# Patient Record
Sex: Female | Born: 1945
Health system: Southern US, Community
[De-identification: ages and names within clinical notes are randomized; demographics above are authoritative.]

## PROBLEM LIST (undated history)

## (undated) DIAGNOSIS — J449 Chronic obstructive pulmonary disease, unspecified: Secondary | ICD-10-CM

## (undated) DIAGNOSIS — I38 Endocarditis, valve unspecified: Secondary | ICD-10-CM

## (undated) DIAGNOSIS — R7303 Prediabetes: Secondary | ICD-10-CM

## (undated) DIAGNOSIS — M1990 Unspecified osteoarthritis, unspecified site: Secondary | ICD-10-CM

## (undated) DIAGNOSIS — Z86010 Personal history of colonic polyps: Secondary | ICD-10-CM

## (undated) DIAGNOSIS — R351 Nocturia: Secondary | ICD-10-CM

## (undated) DIAGNOSIS — Z8719 Personal history of other diseases of the digestive system: Secondary | ICD-10-CM

## (undated) DIAGNOSIS — R011 Cardiac murmur, unspecified: Secondary | ICD-10-CM

## (undated) DIAGNOSIS — C801 Malignant (primary) neoplasm, unspecified: Secondary | ICD-10-CM

## (undated) DIAGNOSIS — R030 Elevated blood-pressure reading, without diagnosis of hypertension: Secondary | ICD-10-CM

## (undated) DIAGNOSIS — G8929 Other chronic pain: Secondary | ICD-10-CM

## (undated) DIAGNOSIS — K5900 Constipation, unspecified: Secondary | ICD-10-CM

## (undated) DIAGNOSIS — G4700 Insomnia, unspecified: Secondary | ICD-10-CM

## (undated) DIAGNOSIS — K219 Gastro-esophageal reflux disease without esophagitis: Secondary | ICD-10-CM

## (undated) DIAGNOSIS — M549 Dorsalgia, unspecified: Secondary | ICD-10-CM

## (undated) DIAGNOSIS — J40 Bronchitis, not specified as acute or chronic: Secondary | ICD-10-CM

## (undated) DIAGNOSIS — J45909 Unspecified asthma, uncomplicated: Secondary | ICD-10-CM

## (undated) DIAGNOSIS — M2550 Pain in unspecified joint: Secondary | ICD-10-CM

## (undated) HISTORY — DX: Elevated blood-pressure reading, without diagnosis of hypertension: R03.0

## (undated) HISTORY — DX: Endocarditis, valve unspecified: I38

## (undated) HISTORY — PX: BACK SURGERY: SHX140

## (undated) HISTORY — DX: Constipation, unspecified: K59.00

## (undated) HISTORY — PX: OTHER SURGICAL HISTORY: SHX169

## (undated) HISTORY — PX: COLONOSCOPY: SHX174

## (undated) HISTORY — DX: Personal history of colonic polyps: Z86.010

## (undated) HISTORY — PX: ABDOMINAL HYSTERECTOMY: SHX81

## (undated) HISTORY — PX: WRIST SURGERY: SHX841

## (undated) HISTORY — PX: PARTIAL HYSTERECTOMY: SHX80

## (undated) HISTORY — DX: Pain in unspecified joint: M25.50

## (undated) HISTORY — PX: TUBAL LIGATION: SHX77

## (undated) HISTORY — PX: RECTOCELE REPAIR: SHX761

## (undated) HISTORY — PX: RESECTION TUMOR WRIST RADICAL: SUR1266

## (undated) HISTORY — DX: Prediabetes: R73.03

## (undated) HISTORY — PX: BUNIONECTOMY: SHX129

---

## 1972-07-14 HISTORY — PX: TUBAL LIGATION: SHX77

## 1986-07-14 HISTORY — PX: BACK SURGERY: SHX140

## 1998-01-19 ENCOUNTER — Other Ambulatory Visit: Admission: RE | Admit: 1998-01-19 | Discharge: 1998-01-19 | Payer: Self-pay | Admitting: *Deleted

## 1999-04-25 ENCOUNTER — Other Ambulatory Visit: Admission: RE | Admit: 1999-04-25 | Discharge: 1999-04-25 | Payer: Self-pay | Admitting: *Deleted

## 2000-08-06 ENCOUNTER — Other Ambulatory Visit: Admission: RE | Admit: 2000-08-06 | Discharge: 2000-08-06 | Payer: Self-pay | Admitting: *Deleted

## 2000-08-06 ENCOUNTER — Encounter: Payer: Self-pay | Admitting: Family Medicine

## 2000-08-06 ENCOUNTER — Encounter: Admission: RE | Admit: 2000-08-06 | Discharge: 2000-08-06 | Payer: Self-pay | Admitting: Family Medicine

## 2003-03-13 DIAGNOSIS — Z860100 Personal history of colon polyps, unspecified: Secondary | ICD-10-CM

## 2003-03-13 HISTORY — DX: Personal history of colonic polyps: Z86.010

## 2003-03-13 HISTORY — DX: Personal history of colon polyps, unspecified: Z86.0100

## 2006-09-22 ENCOUNTER — Ambulatory Visit (HOSPITAL_COMMUNITY): Admission: RE | Admit: 2006-09-22 | Discharge: 2006-09-22 | Payer: Self-pay | Admitting: General Surgery

## 2011-08-21 ENCOUNTER — Encounter (HOSPITAL_COMMUNITY): Payer: Self-pay | Admitting: Pharmacy Technician

## 2011-09-01 NOTE — Pre-Procedure Instructions (Signed)
20 Janice Pace  09/01/2011   Your procedure is scheduled on:   Wednesday 09/03/11    Report to [facility] at 1000 AM.  Call this number if you have problems the morning of surgery: [phone]   Remember:   Do not eat food:After Midnight.  May have clear liquids: up to 4 Hours before arrival.  Clear liquids include soda, tea, black coffee, apple or grape juice, broth.  Take these medicines the morning of surgery with A SIP OF WATER:  Prilosec, oxycodone    Do not wear jewelry, make-up or nail polish.  Do not wear lotions, powders, or perfumes. You may wear deodorant.  Do not shave 48 hours prior to surgery.  Do not bring valuables to the hospital.  Contacts, dentures or bridgework may not be worn into surgery.  Leave suitcase in the car. After surgery it may be brought to your room.  For patients admitted to the hospital, checkout time is 11:00 AM the day of discharge.   Patients discharged the day of surgery will not be allowed to drive home.  Name and phone number of your driver:   Special Instructions: CHG Shower Use Special Wash: 1/2 bottle night before surgery and 1/2 bottle morning of surgery.   Please read over the following fact sheets that you were given: Pain Booklet, Total Joint Packet and MRSA Information

## 2011-09-02 ENCOUNTER — Encounter (HOSPITAL_COMMUNITY)
Admission: RE | Admit: 2011-09-02 | Discharge: 2011-09-02 | Disposition: A | Discharge status: Home / Self Care | Payer: Worker's Compensation | Source: Ambulatory Visit | Attending: Neurosurgery | Admitting: Neurosurgery

## 2011-09-02 ENCOUNTER — Encounter (HOSPITAL_COMMUNITY): Payer: Self-pay

## 2011-09-02 HISTORY — DX: Insomnia, unspecified: G47.00

## 2011-09-02 HISTORY — DX: Gastro-esophageal reflux disease without esophagitis: K21.9

## 2011-09-02 HISTORY — DX: Other chronic pain: G89.29

## 2011-09-02 HISTORY — DX: Nocturia: R35.1

## 2011-09-02 HISTORY — DX: Cardiac murmur, unspecified: R01.1

## 2011-09-02 HISTORY — DX: Dorsalgia, unspecified: M54.9

## 2011-09-02 HISTORY — DX: Malignant (primary) neoplasm, unspecified: C80.1

## 2011-09-02 LAB — BASIC METABOLIC PANEL
BUN: 15 mg/dL (ref 6–23)
Calcium: 9.5 mg/dL (ref 8.4–10.5)
GFR calc Af Amer: >90 mL/min (ref >90)
GFR calc non Af Amer: >90 mL/min (ref >90)
Potassium: 3.9 mEq/L (ref 3.5–5.1)
Sodium: 137 mEq/L (ref 135–145)

## 2011-09-02 LAB — SURGICAL PCR SCREEN
MRSA, PCR: NEGATIVE
Staphylococcus aureus: NEGATIVE

## 2011-09-02 LAB — CBC
Hemoglobin: 12.6 g/dL (ref 12.0–15.0)
MCH: 30.2 pg (ref 26.0–34.0)
MCHC: 34.4 g/dL (ref 30.0–36.0)
Platelets: 223 10*3/uL (ref 150–400)
RBC: 4.17 MIL/uL (ref 3.87–5.11)

## 2011-09-02 LAB — TYPE AND SCREEN

## 2011-09-02 MED ORDER — CEFAZOLIN SODIUM 1-5 GM-% IV SOLN
1.0000 g | INTRAVENOUS | Status: AC
  Administered 2011-09-03: 1 g via INTRAVENOUS

## 2011-09-02 NOTE — Progress Notes (Signed)
Pt has never had a stress test/echo/heart cath  Doesn't have a cardiologist 

## 2011-09-03 ENCOUNTER — Encounter (HOSPITAL_COMMUNITY): Payer: Self-pay | Admitting: Certified Registered"

## 2011-09-03 ENCOUNTER — Ambulatory Visit (HOSPITAL_COMMUNITY): Payer: Worker's Compensation | Admitting: Certified Registered"

## 2011-09-03 ENCOUNTER — Ambulatory Visit (HOSPITAL_COMMUNITY): Payer: Worker's Compensation

## 2011-09-03 ENCOUNTER — Inpatient Hospital Stay (HOSPITAL_COMMUNITY)
Admission: RE | Admit: 2011-09-03 | Discharge: 2011-09-05 | DRG: 460 | Disposition: A | Discharge status: Home / Self Care | Payer: Worker's Compensation | Source: Ambulatory Visit | Attending: Neurosurgery | Admitting: Neurosurgery

## 2011-09-03 ENCOUNTER — Encounter (HOSPITAL_COMMUNITY)
Admission: RE | Disposition: A | Discharge status: Home / Self Care | Payer: Self-pay | Source: Ambulatory Visit | Attending: Neurosurgery

## 2011-09-03 ENCOUNTER — Encounter (HOSPITAL_COMMUNITY): Payer: Self-pay | Admitting: *Deleted

## 2011-09-03 DIAGNOSIS — Z01812 Encounter for preprocedural laboratory examination: Secondary | ICD-10-CM

## 2011-09-03 DIAGNOSIS — M5126 Other intervertebral disc displacement, lumbar region: Principal | ICD-10-CM | POA: Diagnosis present

## 2011-09-03 DIAGNOSIS — K219 Gastro-esophageal reflux disease without esophagitis: Secondary | ICD-10-CM | POA: Diagnosis present

## 2011-09-03 DIAGNOSIS — M5137 Other intervertebral disc degeneration, lumbosacral region: Secondary | ICD-10-CM | POA: Diagnosis present

## 2011-09-03 DIAGNOSIS — K5900 Constipation, unspecified: Secondary | ICD-10-CM | POA: Diagnosis present

## 2011-09-03 DIAGNOSIS — M51379 Other intervertebral disc degeneration, lumbosacral region without mention of lumbar back pain or lower extremity pain: Secondary | ICD-10-CM | POA: Diagnosis present

## 2011-09-03 HISTORY — PX: BACK SURGERY: SHX140

## 2011-09-03 SURGERY — POSTERIOR LUMBAR FUSION 2 LEVEL
Anesthesia: General | Site: Back | Wound class: Clean

## 2011-09-03 MED ORDER — HYDROMORPHONE HCL PF 1 MG/ML IJ SOLN
0.2500 mg | INTRAMUSCULAR | Status: DC | PRN
  Administered 2011-09-03 (×4): 0.5 mg via INTRAVENOUS

## 2011-09-03 MED ORDER — OXYCODONE-ACETAMINOPHEN 5-325 MG PO TABS
1.0000 | ORAL_TABLET | ORAL | Status: DC | PRN
  Administered 2011-09-04 – 2011-09-05 (×8): 2 via ORAL
  Filled 2011-09-03 (×8): qty 2

## 2011-09-03 MED ORDER — ACETAMINOPHEN 325 MG PO TABS: 650.0000 mg | ORAL_TABLET | ORAL | Status: DC | PRN

## 2011-09-03 MED ORDER — DROPERIDOL 2.5 MG/ML IJ SOLN
INTRAMUSCULAR | Status: DC | PRN
  Administered 2011-09-03: 0.625 mg via INTRAVENOUS

## 2011-09-03 MED ORDER — PANTOPRAZOLE SODIUM 40 MG PO TBEC
40.0000 mg | DELAYED_RELEASE_TABLET | Freq: Every day | ORAL | Status: DC
  Administered 2011-09-04: 40 mg via ORAL
  Filled 2011-09-03: qty 1

## 2011-09-03 MED ORDER — CYCLOBENZAPRINE HCL 10 MG PO TABS: 10.0000 mg | ORAL_TABLET | Freq: Three times a day (TID) | ORAL | Status: DC | PRN

## 2011-09-03 MED ORDER — NEOSTIGMINE METHYLSULFATE 1 MG/ML IJ SOLN
INTRAMUSCULAR | Status: DC | PRN
  Administered 2011-09-03: 3 mg via INTRAVENOUS

## 2011-09-03 MED ORDER — THROMBIN 20000 UNITS EX KIT
PACK | CUTANEOUS | Status: DC | PRN
  Administered 2011-09-03 (×2): via TOPICAL

## 2011-09-03 MED ORDER — BACITRACIN 50000 UNITS IM SOLR
INTRAMUSCULAR | Status: AC
  Filled 2011-09-03: qty 1

## 2011-09-03 MED ORDER — BUPIVACAINE HCL (PF) 0.25 % IJ SOLN
INTRAMUSCULAR | Status: DC | PRN
  Administered 2011-09-03: 30 mL

## 2011-09-03 MED ORDER — HYDROMORPHONE HCL PF 1 MG/ML IJ SOLN
0.5000 mg | INTRAMUSCULAR | Status: DC | PRN
  Administered 2011-09-03: 0.5 mg via INTRAVENOUS
  Administered 2011-09-03 – 2011-09-04 (×5): 1 mg via INTRAVENOUS
  Filled 2011-09-03 (×6): qty 1

## 2011-09-03 MED ORDER — HYDROMORPHONE HCL PF 1 MG/ML IJ SOLN
INTRAMUSCULAR | Status: AC
  Administered 2011-09-03: 0.5 mg via INTRAVENOUS
  Filled 2011-09-03: qty 1

## 2011-09-03 MED ORDER — ONDANSETRON HCL 4 MG/2ML IJ SOLN: 4.0000 mg | Freq: Once | INTRAMUSCULAR | Status: DC | PRN

## 2011-09-03 MED ORDER — VARENICLINE TARTRATE 1 MG PO TABS
1.0000 mg | ORAL_TABLET | Freq: Two times a day (BID) | ORAL | Status: DC
  Administered 2011-09-04 – 2011-09-05 (×3): 1 mg via ORAL
  Filled 2011-09-03 (×5): qty 1

## 2011-09-03 MED ORDER — ONDANSETRON HCL 4 MG/2ML IJ SOLN
INTRAMUSCULAR | Status: DC | PRN
  Administered 2011-09-03: 4 mg via INTRAVENOUS

## 2011-09-03 MED ORDER — SODIUM CHLORIDE 0.9 % IV SOLN
INTRAVENOUS | Status: AC
  Filled 2011-09-03: qty 500

## 2011-09-03 MED ORDER — ACETAMINOPHEN 650 MG RE SUPP: 650.0000 mg | RECTAL | Status: DC | PRN

## 2011-09-03 MED ORDER — CEFAZOLIN SODIUM 1-5 GM-% IV SOLN
1.0000 g | Freq: Three times a day (TID) | INTRAVENOUS | Status: AC
  Administered 2011-09-03 – 2011-09-04 (×2): 1 g via INTRAVENOUS
  Filled 2011-09-03 (×2): qty 50

## 2011-09-03 MED ORDER — DEXAMETHASONE SODIUM PHOSPHATE 4 MG/ML IJ SOLN
INTRAMUSCULAR | Status: DC | PRN
  Administered 2011-09-03: 4 mg via INTRAVENOUS

## 2011-09-03 MED ORDER — LIDOCAINE-EPINEPHRINE 1 %-1:100000 IJ SOLN
INTRAMUSCULAR | Status: DC | PRN
  Administered 2011-09-03: 20 mL

## 2011-09-03 MED ORDER — MIDAZOLAM HCL 5 MG/5ML IJ SOLN
INTRAMUSCULAR | Status: DC | PRN
  Administered 2011-09-03: 2 mg via INTRAVENOUS

## 2011-09-03 MED ORDER — SODIUM CHLORIDE 0.9 % IJ SOLN
3.0000 mL | Freq: Two times a day (BID) | INTRAMUSCULAR | Status: DC
  Administered 2011-09-03 – 2011-09-04 (×3): 3 mL via INTRAVENOUS

## 2011-09-03 MED ORDER — LACTATED RINGERS IV SOLN
INTRAVENOUS | Status: DC | PRN
  Administered 2011-09-03 (×3): via INTRAVENOUS

## 2011-09-03 MED ORDER — PHENOL 1.4 % MT LIQD: 1.0000 | OROMUCOSAL | Status: DC | PRN

## 2011-09-03 MED ORDER — SODIUM CHLORIDE 0.9 % IR SOLN
Status: DC | PRN
  Administered 2011-09-03: 14:00:00

## 2011-09-03 MED ORDER — POTASSIUM GLUCONATE 595 MG PO CAPS: 1.0000 | ORAL_CAPSULE | Freq: Every day | ORAL | Status: DC

## 2011-09-03 MED ORDER — 0.9 % SODIUM CHLORIDE (POUR BTL) OPTIME
TOPICAL | Status: DC | PRN
  Administered 2011-09-03: 1000 mL

## 2011-09-03 MED ORDER — ROCURONIUM BROMIDE 100 MG/10ML IV SOLN
INTRAVENOUS | Status: DC | PRN
  Administered 2011-09-03: 10 mg via INTRAVENOUS
  Administered 2011-09-03: 50 mg via INTRAVENOUS
  Administered 2011-09-03: 10 mg via INTRAVENOUS
  Administered 2011-09-03: 30 mg via INTRAVENOUS
  Administered 2011-09-03: 10 mg via INTRAVENOUS

## 2011-09-03 MED ORDER — ONDANSETRON HCL 4 MG/2ML IJ SOLN: 4.0000 mg | INTRAMUSCULAR | Status: DC | PRN

## 2011-09-03 MED ORDER — LIDOCAINE HCL 4 % MT SOLN
OROMUCOSAL | Status: DC | PRN
  Administered 2011-09-03: 4 mL via TOPICAL

## 2011-09-03 MED ORDER — VARENICLINE TARTRATE 0.5 MG X 11 & 1 MG X 42 PO MISC: 1.0000 | Freq: Two times a day (BID) | ORAL | Status: DC

## 2011-09-03 MED ORDER — PROPOFOL 10 MG/ML IV EMUL
INTRAVENOUS | Status: DC | PRN
  Administered 2011-09-03: 170 mg via INTRAVENOUS

## 2011-09-03 MED ORDER — SUFENTANIL CITRATE 50 MCG/ML IV SOLN
INTRAVENOUS | Status: DC | PRN
  Administered 2011-09-03 (×5): 10 ug via INTRAVENOUS

## 2011-09-03 MED ORDER — OXYCODONE-ACETAMINOPHEN 7.5-325 MG PO TABS: 1.0000 | ORAL_TABLET | ORAL | Status: DC | PRN

## 2011-09-03 MED ORDER — PANTOPRAZOLE SODIUM 40 MG IV SOLR
40.0000 mg | Freq: Every day | INTRAVENOUS | Status: DC
  Administered 2011-09-03: 40 mg via INTRAVENOUS
  Filled 2011-09-03 (×2): qty 40

## 2011-09-03 MED ORDER — LIDOCAINE HCL (CARDIAC) 20 MG/ML IV SOLN
INTRAVENOUS | Status: DC | PRN
  Administered 2011-09-03: 100 mg via INTRAVENOUS

## 2011-09-03 MED ORDER — CYCLOBENZAPRINE HCL 10 MG PO TABS
10.0000 mg | ORAL_TABLET | Freq: Three times a day (TID) | ORAL | Status: DC | PRN
  Administered 2011-09-04 (×3): 10 mg via ORAL
  Filled 2011-09-03 (×3): qty 1

## 2011-09-03 MED ORDER — GLYCOPYRROLATE 0.2 MG/ML IJ SOLN
INTRAMUSCULAR | Status: DC | PRN
  Administered 2011-09-03: .6 mg via INTRAVENOUS

## 2011-09-03 MED ORDER — MENTHOL 3 MG MT LOZG: 1.0000 | LOZENGE | OROMUCOSAL | Status: DC | PRN

## 2011-09-03 SURGICAL SUPPLY — 76 items
ADH SKN CLS APL DERMABOND .7 (GAUZE/BANDAGES/DRESSINGS) ×1
APL SKNCLS STERI-STRIP NONHPOA (GAUZE/BANDAGES/DRESSINGS) ×2
BAG DECANTER FOR FLEXI CONT (MISCELLANEOUS) ×2 IMPLANT
BENZOIN TINCTURE PRP APPL 2/3 (GAUZE/BANDAGES/DRESSINGS) ×3 IMPLANT
BLADE SURG 11 STRL SS (BLADE) ×2 IMPLANT
BLADE SURG ROTATE 9660 (MISCELLANEOUS) IMPLANT
BRUSH SCRUB EZ PLAIN DRY (MISCELLANEOUS) ×2 IMPLANT
BUR MATCHSTICK NEURO 3.0 LAGG (BURR) ×2 IMPLANT
BUR PRECISION FLUTE 6.0 (BURR) ×2 IMPLANT
CANISTER SUCTION 2500CC (MISCELLANEOUS) ×2 IMPLANT
CAP REVERE LOCKING (Cap) ×6 IMPLANT
CLOSURE STERI STRIP 1/2 X4 (GAUZE/BANDAGES/DRESSINGS) ×1 IMPLANT
CLOTH BEACON ORANGE TIMEOUT ST (SAFETY) ×2 IMPLANT
CONN CROSSLINK REV 6.35 48-60 (Connector) ×2 IMPLANT
CONNECTOR CRSLNK REV6.35 48-60 (Connector) IMPLANT
CONT SPEC 4OZ CLIKSEAL STRL BL (MISCELLANEOUS) ×4 IMPLANT
COVER BACK TABLE 24X17X13 BIG (DRAPES) IMPLANT
COVER TABLE BACK 60X90 (DRAPES) ×2 IMPLANT
DECANTER SPIKE VIAL GLASS SM (MISCELLANEOUS) ×2 IMPLANT
DERMABOND ADVANCED (GAUZE/BANDAGES/DRESSINGS) ×1
DERMABOND ADVANCED .7 DNX12 (GAUZE/BANDAGES/DRESSINGS) ×1 IMPLANT
DRAPE C-ARM 42X72 X-RAY (DRAPES) ×4 IMPLANT
DRAPE LAPAROTOMY 100X72X124 (DRAPES) ×2 IMPLANT
DRAPE POUCH INSTRU U-SHP 10X18 (DRAPES) ×2 IMPLANT
DRAPE PROXIMA HALF (DRAPES) IMPLANT
DRAPE SURG 17X23 STRL (DRAPES) ×2 IMPLANT
DRSG OPSITE 4X5.5 SM (GAUZE/BANDAGES/DRESSINGS) ×4 IMPLANT
ELECT REM PT RETURN 9FT ADLT (ELECTROSURGICAL) ×2
ELECTRODE REM PT RTRN 9FT ADLT (ELECTROSURGICAL) ×1 IMPLANT
EVACUATOR 3/16  PVC DRAIN (DRAIN) ×1
EVACUATOR 3/16 PVC DRAIN (DRAIN) ×1 IMPLANT
GAUZE SPONGE 4X4 12PLY STRL LF (GAUZE/BANDAGES/DRESSINGS) ×1 IMPLANT
GAUZE SPONGE 4X4 16PLY XRAY LF (GAUZE/BANDAGES/DRESSINGS) ×1 IMPLANT
GLOVE BIO SURGEON STRL SZ8 (GLOVE) ×5 IMPLANT
GLOVE BIOGEL PI IND STRL 7.0 (GLOVE) IMPLANT
GLOVE BIOGEL PI IND STRL 7.5 (GLOVE) IMPLANT
GLOVE BIOGEL PI INDICATOR 7.0 (GLOVE) ×3
GLOVE BIOGEL PI INDICATOR 7.5 (GLOVE) ×1
GLOVE ECLIPSE 7.5 STRL STRAW (GLOVE) ×2 IMPLANT
GLOVE ECLIPSE 8.5 STRL (GLOVE) ×1 IMPLANT
GLOVE EXAM NITRILE LRG STRL (GLOVE) IMPLANT
GLOVE EXAM NITRILE MD LF STRL (GLOVE) ×1 IMPLANT
GLOVE EXAM NITRILE XL STR (GLOVE) IMPLANT
GLOVE EXAM NITRILE XS STR PU (GLOVE) IMPLANT
GLOVE INDICATOR 8.5 STRL (GLOVE) ×4 IMPLANT
GLOVE SURG SS PI 6.5 STRL IVOR (GLOVE) ×3 IMPLANT
GOWN BRE IMP SLV AUR LG STRL (GOWN DISPOSABLE) ×2 IMPLANT
GOWN BRE IMP SLV AUR XL STRL (GOWN DISPOSABLE) ×7 IMPLANT
GOWN STRL REIN 2XL LVL4 (GOWN DISPOSABLE) IMPLANT
KIT BASIN OR (CUSTOM PROCEDURE TRAY) ×2 IMPLANT
KIT ROOM TURNOVER OR (KITS) ×2 IMPLANT
MILL MEDIUM DISP (BLADE) ×1 IMPLANT
NDL HYPO 25X1 1.5 SAFETY (NEEDLE) ×1 IMPLANT
NEEDLE HYPO 25X1 1.5 SAFETY (NEEDLE) ×2 IMPLANT
NS IRRIG 1000ML POUR BTL (IV SOLUTION) ×2 IMPLANT
PACK LAMINECTOMY NEURO (CUSTOM PROCEDURE TRAY) ×2 IMPLANT
PAD ARMBOARD 7.5X6 YLW CONV (MISCELLANEOUS) ×6 IMPLANT
PATTIES SURGICAL 1X1 (DISPOSABLE) ×1 IMPLANT
PUTTY BONE DBX 5CC MIX (Putty) ×1 IMPLANT
ROD REVERE CURVED 65MM (Rod) ×2 IMPLANT
SCREW REVERE 6.35 6.5X35MM (Screw) ×2 IMPLANT
SCREW REVERE 6.35 6.5X40MM (Screw) ×4 IMPLANT
SPACER CALIBER 10X22MM 8-12MM (Spacer) ×4 IMPLANT
SPONGE GAUZE 4X4 12PLY (GAUZE/BANDAGES/DRESSINGS) ×2 IMPLANT
SPONGE LAP 4X18 X RAY DECT (DISPOSABLE) IMPLANT
SPONGE SURGIFOAM ABS GEL 100 (HEMOSTASIS) ×3 IMPLANT
STRIP CLOSURE SKIN 1/2X4 (GAUZE/BANDAGES/DRESSINGS) ×4 IMPLANT
SUT VIC AB 0 CT1 18XCR BRD8 (SUTURE) ×1 IMPLANT
SUT VIC AB 0 CT1 8-18 (SUTURE) ×4
SUT VIC AB 2-0 CT1 18 (SUTURE) ×3 IMPLANT
SUT VICRYL 4-0 PS2 18IN ABS (SUTURE) ×3 IMPLANT
SYR 20ML ECCENTRIC (SYRINGE) ×2 IMPLANT
TOWEL OR 17X24 6PK STRL BLUE (TOWEL DISPOSABLE) ×2 IMPLANT
TOWEL OR 17X26 10 PK STRL BLUE (TOWEL DISPOSABLE) ×2 IMPLANT
TRAY FOLEY CATH 14FRSI W/METER (CATHETERS) ×2 IMPLANT
WATER STERILE IRR 1000ML POUR (IV SOLUTION) ×2 IMPLANT

## 2011-09-03 NOTE — Transfer of Care (Signed)
Immediate Anesthesia Transfer of Care Note  Patient: Janice Pace  Procedure(s) Performed: Procedure(s) (LRB): POSTERIOR LUMBAR FUSION 2 LEVEL (N/A)  Patient Location: PACU  Anesthesia Type: General  Level of Consciousness: oriented and sedated  Airway & Oxygen Therapy: Patient Spontanous Breathing and Patient connected to nasal cannula oxygen  Post-op Assessment: Report given to PACU RN and Post -op Vital signs reviewed and stable  Post vital signs: stable  Complications: No apparent anesthesia complications

## 2011-09-03 NOTE — H&P (Signed)
Janice Pace is an 66 y.o. female.   Chief Complaint: Back and right leg pain HPI: This is 65 years and is a long-standing back and from a right buttock and leg pain rate in the back of her leg across her her foot and big toe she's had numbness tingling in the same distribution as well as inclusion on the bottom of her foot. She denies any L. bladder difficulty she does have some pain it is in her left leg but not as severe as her right. This is been refractory to all forms of conservative 2 with anti-inflammatories physical therapy exercises epidural steroid injections. Imaging findings showed still large ruptured disc at L4-5 severe degenerative disease marked facet arthropathy diastased so the facets as well as at L5 S1-1 laminotomy defect in the right. Due to his failure conservative treatment progression clunk syndrome she was recommended posterior decompression fusion others minutes of the operation with her as well as perioperative course and expectations of outcome and alternatives of surgery she understands and agrees to proceed forward.  Past Medical History  Diagnosis Date  . Cancer     dermatosivros arcoma and protuverans  . Heart murmur   . Chronic back pain     degenerative disc disease  . GERD (gastroesophageal reflux disease)     takes Prilosec daily  . Constipation     stool softener daily  . Hemorrhoids   . Nocturia   . Insomnia     d/t chantix    Past Surgical History  Procedure Date  . Tubal ligation 1974  . Abdominal hysterectomy   . Bunionectomy     bilateral  . Back surgery 1988  . Rectocele repair   . Colonoscopy   . Wrist surgery     left    Family History  Problem Relation Age of Onset  . Anesthesia problems Neg Hx   . Hypotension Neg Hx   . Malignant hyperthermia Neg Hx   . Pseudochol deficiency Neg Hx    Social History:  reports that she has quit smoking. She does not have any smokeless tobacco history on file. She reports that she does not drink  alcohol or use illicit drugs.  Allergies: No Known Allergies  Medications Prior to Admission  Medication Dose Route Frequency Provider Last Rate Last Dose  . bacitracin 11914 UNITS injection           . ceFAZolin (ANCEF) IVPB 1 g/50 mL premix  1 g Intravenous 30 min Pre-Op Mariam Dollar, MD      . HYDROmorphone (DILAUDID) injection 0.25-0.5 mg  0.25-0.5 mg Intravenous Q5 min PRN Melonie Florida, MD      . ondansetron [facility]) injection 4 mg  4 mg Intravenous Once PRN Melonie Florida, MD      . sodium chloride 0.9 % infusion            No current outpatient prescriptions on file as of 09/03/2011.    Results for orders placed during the hospital encounter of 09/02/11 (from the past 48 hour(s))  SURGICAL PCR SCREEN     Status: Normal   Collection Time   09/02/11  9:27 AM      Component Value Range Comment   MRSA, PCR NEGATIVE  NEGATIVE     Staphylococcus aureus NEGATIVE  NEGATIVE    BASIC METABOLIC PANEL     Status: Abnormal   Collection Time   09/02/11  9:28 AM      Component Value Range  Comment   Sodium 137  135 - 145 (mEq/L)    Potassium 3.9  3.5 - 5.1 (mEq/L)    Chloride 106  96 - 112 (mEq/L)    CO2 22  19 - 32 (mEq/L)    Glucose, Bld 102 (*) 70 - 99 (mg/dL)    BUN 15  6 - 23 (mg/dL)    Creatinine, Ser 7.82  0.50 - 1.10 (mg/dL)    Calcium 9.5  8.4 - 10.5 (mg/dL)    GFR calc non Af Amer >90  >90 (mL/min)    GFR calc Af Amer >90  >90 (mL/min)   CBC     Status: Normal   Collection Time   09/02/11  9:28 AM      Component Value Range Comment   WBC 5.7  4.0 - 10.5 (K/uL)    RBC 4.17  3.87 - 5.11 (MIL/uL)    Hemoglobin 12.6  12.0 - 15.0 (g/dL)    HCT 95.6  21.3 - 08.6 (%)    MCV 87.8  78.0 - 100.0 (fL)    MCH 30.2  26.0 - 34.0 (pg)    MCHC 34.4  30.0 - 36.0 (g/dL)    RDW 57.8  46.9 - 62.9 (%)    Platelets 223  150 - 400 (K/uL)   TYPE AND SCREEN     Status: Normal   Collection Time   09/02/11  9:35 AM      Component Value Range Comment   ABO/RH(D) B POS      Antibody  Screen NEG      Sample Expiration 09/05/2011     ABO/RH     Status: Normal   Collection Time   09/02/11  9:35 AM      Component Value Range Comment   ABO/RH(D) B POS      No results found.  Review of Systems  Constitutional: Negative.   HENT: Negative.   Respiratory: Negative.   Cardiovascular: Negative.   Gastrointestinal: Negative.   Genitourinary: Negative.   Musculoskeletal: Positive for myalgias and back pain.  Skin: Negative.   Neurological: Positive for tingling.    Blood pressure 119/78, pulse 99, temperature 98.2 F (36.8 C), temperature source Oral, resp. rate 20, SpO2 96.00%. Physical Exam  Constitutional: She is oriented to person, place, and time. She appears well-developed.  HENT:  Head: Normocephalic.  Eyes: Pupils are equal, round, and reactive to light.  Neck: Neck supple.  Cardiovascular: Normal rate.   Respiratory: Breath sounds normal.  GI: Bowel sounds are normal.  Neurological: She is alert and oriented to person, place, and time.  Reflex Scores:      Patellar reflexes are 0 on the right side and 0 on the left side.      Achilles reflexes are 0 on the right side and 0 on the left side.      Patient is 5 out of 5 strength in her left lower extremities however the right looks to me has 4 minus out of 5 weakness in dorsiflexion and EHL proximally is 5 out of 5 in the right looks to me as well.     Assessment/Plan 66 year old presents for L4-5 L5-S1 posterior lumbar interbody fusion risks and benefits of the operation were explained the patient as well as the perioperative course alternatives of surgery expectations of outcome. He understands only and agrees to proceed forward.  [name] P 09/03/2011, 12:33 PM

## 2011-09-03 NOTE — Op Note (Signed)
Preoperative diagnosis: Degenerative disc disease L4-5 L5-S1 herniated nucleus pulposus L4-5  Postoperative diagnosis: Same  Procedure: Decompressive lumbar laminectomy L4-5 in excess will be new with a standard interbody fusion redo decompressive laminectomy L5-S1 posterior lumbar interbody fusion L4-5 L5-S1 using the caliper peek expandable cages packed with local autograft mixed with DBX and pedicle screw fixation L4-S1 using the globus Revere pedicle screw system. Posterior lateral arthrodesis L4-S1 using locally harvested autograft mixed with DBX  Surgeon: Jillyn Hidden Timouthy Gilardi  Assistants: Marikay Alar  Anesthesia: Gen.  EBL: Less than 200  History of present illness: Patient reports 66 year old female who has had long-standing back pain and probably right leg pain she undergone previous right-sided L5-S1 next me for a disc. Over the patient has had rest worsening back and right leg pain. Workup revealed a large ruptured disc at L4-5 migrating caudally behind the L5 vertebral body displacing the L5 nerve root. Additional workup showed severe degenerative disc disease L4-5 L5-S1 with modic changes and severe degenerative collapse causing foraminal stenosis and disc space collapse as well. To the patient's combination of severe back and leg pain and failure conservative treatment imaging findings and progression of clinical syndrome I recommended decompression and stabilization procedure at L4-5 and L5-S1. One of the risk benefits of the operation, perioperative course, alternatives of surgery, expectations of outcome she understands and agrees to proceed forward.  Operative procedure: This is brought in the or was induced under general anesthesia and positioned prone on the Milne frame her back was prepped and draped in routine sterile fashion. Her old incision was verified and extended cephalad caudally subperiosteal dissection was carried out the lamina of L4-5 and S1 bilaterally exposing the TPS at L4-5  and S1 bilaterally. Interoperative x-ray confirmed an identify the appropriate level. The spinous processes at L4 and L5 removing such decompression was begun complete medial facetectomies were performed bilaterally at L4-5 on the left at L5-S1 and the scar tissues dissected off of the right side and complete medial facetectomy was continued and progressed on the right as well. All foramina were widely decompressed distally at the foramen there is a large herniated disc immediately identified at L4-5 on the right underneath the L5 nerve root and between the L5 nerve root the pedicle. Is all removed after extension of his annulotomy and nerve hook to fish out all fragments. After this was accomplished and the L5 nerve of the right was decompressed attention second pedicle screw placement the using a high-speed drill pilot holes were drilled at L4 with the awl probed after 55 tap probed again and a 6 x 40 screw inserted L4 and the right similar fashion the L5 and S1 pedicles were also identified cannulated probed tapped probed and 12/[birth year]/1938 screw inserted L5 6 x 35 at S1. 3 screws were placed on the left side similar fashion fluoroscopy used the step along the way to confirm depth and trajectory. After all screws in place attention was taken to the interbody work and 8 distractor was inserted on the right L4-5 disc space is then cleaned out from the left with a size 8 rotating cutter and a size 89 paddle cutters and curettes were also used to scrape the endplate after adequate endplate preparation been achieved a by 10 mm x 22 mm expandable 12 mm caliper peek cage was packed with local graft mixed with DBX then inserted on the patient's left side L4-5 is expanded almost his full extension approximately 12 mm opening of the disc space present for the  fibers that side. The right and fluoroscopy confirmed good position of the implant. In a similar fashion all the endplates were prepped on the right side L4-5 after  adequate preparation both centrally and laterally was achieved local autograft mixed DBX was packed centrally right-sided cage was inserted and it is well to the same height and graft was packed laterally to cages bilaterally. Fluoroscopy be confirmed the position the cages at L4-5 to. In a similar fashion L5-S1 was prepped out however this disc spaces markedly more collapsed size 7 distractor was inserted to open up the disc space is then cleaned out and 8 expandable cage was inserted a similar fashion to the disc space up to about 10 or 11 mm local autograft that he was again packed centrally after completion endplate preparation and another cages inserted left side. After all the implants in place the wound was copiously irrigated meticulous incision was maintained in a foraminal reexplored to confirm patency. Then the aggressive decortication was carried out on the TPS and lateral gutters. The remainder of the posterior lateral bone was then packed an L4 down to S1. The rods were then contoured in lordosis top tightening nuts were tightened down to S1 he L5 screw was compressed against S1 and the L4 compressed against L5. Then a foraminal again reinspected Gelfoam was overlaid top of the dura across it was applied and the wounds closed in layers after placement of a Hemovac drain with interrupted Vicryl and a running 4 subcuticular in the skin. Benzoin and Steri-Strips were applied. At the end of case all needle counts and sponge counts were correct per the nursing.

## 2011-09-03 NOTE — Progress Notes (Signed)
PHARMACIST - PHYSICIAN ORDER COMMUNICATION  CONCERNING: P&T Medication Policy on Herbal Medications  DESCRIPTION:  This patient's order for: Potassium gluconate 595mg  has been noted.  This product(s) is classified as an "herbal" or natural product. Due to a lack of definitive safety studies or FDA approval, nonstandard manufacturing practices, plus the potential risk of unknown drug-drug interactions while on inpatient medications, the Pharmacy and Therapeutics Committee does not permit the use of "herbal" or natural products of this type within [facility].   ACTION TAKEN: The pharmacy department is unable to verify this order at this time and your patient has been informed of this safety policy. Please reevaluate patient's clinical condition at discharge and address if the herbal or natural product(s) should be resumed at that time.  Arman Filter 09/03/2011, 19:58 PM

## 2011-09-03 NOTE — Anesthesia Preprocedure Evaluation (Signed)
Anesthesia Evaluation  Patient identified by MRN, date of birth, ID band Patient awake    Reviewed: Allergy & Precautions, H&P , NPO status   Airway       Dental  (+) Teeth Intact   Pulmonary  clear to auscultation        Cardiovascular Regular Normal    Neuro/Psych    GI/Hepatic   Endo/Other    Renal/GU      Musculoskeletal   Abdominal   Peds  Hematology   Anesthesia Other Findings   Reproductive/Obstetrics                           Anesthesia Physical Anesthesia Plan  ASA: II  Anesthesia Plan: General   Post-op Pain Management:    Induction: Intravenous  Airway Management Planned: Oral ETT  Additional Equipment:   Intra-op Plan:   Post-operative Plan: Extubation in OR  Informed Consent: I have reviewed the patients History and Physical, chart, labs and discussed the procedure including the risks, benefits and alternatives for the proposed anesthesia with the patient or authorized representative who has indicated his/her understanding and acceptance.   Dental advisory given  Plan Discussed with:   Anesthesia Plan Comments: (Smoker GERD Lumbar spondylosis  Plan GA  Kipp Brood, MD)        Anesthesia Quick Evaluation

## 2011-09-03 NOTE — Anesthesia Postprocedure Evaluation (Signed)
  Anesthesia Post-op Note  Patient: Janice Pace  Procedure(s) Performed: Procedure(s) (LRB): POSTERIOR LUMBAR FUSION 2 LEVEL (N/A)  Patient Location: PACU  Anesthesia Type: General  Level of Consciousness: awake, alert  and oriented  Airway and Oxygen Therapy: Patient Spontanous Breathing  Post-op Pain: mild  Post-op Assessment: Post-op Vital signs reviewed and Patient's Cardiovascular Status Stable  Post-op Vital Signs: stable  Complications: No apparent anesthesia complications

## 2011-09-04 MED ORDER — MANAGING BACK PAIN BOOK
Freq: Once | Status: AC
  Administered 2011-09-04: 08:00:00
  Filled 2011-09-04: qty 1

## 2011-09-04 NOTE — Progress Notes (Signed)
OT Discharge Note  Patient is being discharged from OT services secondary to:    Goals met and no further therapy needs identified.  Please see latest Therapy Progress Note for current level of functioning and progress toward goals.  Progress and discharge plan and discussed with patient/caregiver and they    Agree    Johnston, Brandon Wiechman Brynn   OTR/L Pager: [phone] Office: [phone] .     

## 2011-09-04 NOTE — Progress Notes (Signed)
Physical Therapy Evaluation Patient Details Name: BREDA BOND MRN: [id number] DOB: 10-06-1945 Today's Date: 09/04/2011  Problem List: There is no problem list on file for this patient.   Past Medical History:  Past Medical History  Diagnosis Date  . Cancer     dermatosivros arcoma and protuverans  . Heart murmur   . Chronic back pain     degenerative disc disease  . GERD (gastroesophageal reflux disease)     takes Prilosec daily  . Constipation     stool softener daily  . Hemorrhoids   . Nocturia   . Insomnia     d/t chantix   Past Surgical History:  Past Surgical History  Procedure Date  . Tubal ligation 1974  . Abdominal hysterectomy   . Bunionectomy     bilateral  . Back surgery 1988  . Rectocele repair   . Colonoscopy   . Wrist surgery     left    PT Assessment/Plan/Recommendation PT Assessment Clinical Impression Statement: pt presents s/p PLIF.  pt moving well and very motivated to improve mobility.  pt attempts to follow all precautions and education.  Should make great progress.   PT Recommendation/Assessment: Patient will need skilled PT in the acute care venue PT Problem List: Decreased activity tolerance;Decreased balance;Decreased mobility;Decreased knowledge of use of DME;Decreased knowledge of precautions;Pain Barriers to Discharge: None PT Therapy Diagnosis : Difficulty walking;Acute pain PT Plan PT Frequency: Min 5X/week PT Treatment/Interventions: DME instruction;Gait training;Stair training;Functional mobility training;Therapeutic activities;Therapeutic exercise;Balance training;Neuromuscular re-education;Patient/family education PT Recommendation Follow Up Recommendations: Home health PT;Supervision - Intermittent Equipment Recommended: Rolling walker with 5" wheels PT Goals  Acute Rehab PT Goals PT Goal Formulation: With patient Time For Goal Achievement: 2 weeks Pt will Roll Supine to Right Side: Independently PT Goal: Rolling Supine to  Right Side - Progress: Goal set today Pt will Roll Supine to Left Side: Independently PT Goal: Rolling Supine to Left Side - Progress: Goal set today Pt will go Supine/Side to Sit: Independently PT Goal: Supine/Side to Sit - Progress: Goal set today Pt will go Sit to Supine/Side: Independently PT Goal: Sit to Supine/Side - Progress: Goal set today Pt will go Sit to Stand: with modified independence;with upper extremity assist PT Goal: Sit to Stand - Progress: Goal set today Pt will Ambulate: >150 feet;with modified independence;with rolling walker PT Goal: Ambulate - Progress: Goal set today Pt will Go Up / Down Stairs: 3-5 stairs;with min assist;with least restrictive assistive device PT Goal: Up/Down Stairs - Progress: Goal set today Additional Goals Additional Goal #1: pt will verbalize and follow 3/3 back precautions.   PT Goal: Additional Goal #1 - Progress: Goal set today  PT Evaluation Precautions/Restrictions  Precautions Precautions: Back Precaution Booklet Issued: Yes (comment) Required Braces or Orthoses: Yes Spinal Brace: Lumbar corset;Applied in sitting position Restrictions Weight Bearing Restrictions: No Prior Functioning  Home Living Lives With: Spouse Receives Help From: Family Type of Home: House Home Layout: One level Home Access: Stairs to enter Entrance Stairs-Rails: Left Entrance Stairs-Number of Steps: 2 Bathroom Shower/Tub: Health visitor: Standard Bathroom Accessibility: Yes How Accessible: Accessible via walker Home Adaptive Equipment: Shower chair without back;Straight cane Prior Function Level of Independence: Needs assistance with ADLs;Needs assistance with homemaking;Independent with gait;Independent with transfers Able to Take Stairs?: Yes Driving: No Vocation: On disability Vocation Requirements: Psychologist, educational Orientation Level: Oriented X4 Sensation/Coordination   Extremity Assessment RLE  Assessment RLE Assessment: Within Functional Limits LLE Assessment LLE Assessment: Within Functional Limits Mobility (  including Balance) Bed Mobility Bed Mobility: Yes Rolling Right: 5: Supervision Rolling Right Details (indicate cue type and reason): cues for log roll Right Sidelying to Sit: 5: Supervision Right Sidelying to Sit Details (indicate cue type and reason): cues for sequencing and technique Sitting - Scoot to Edge of Bed: 7: Independent Transfers Transfers: Yes Sit to Stand: 4: Min assist;With upper extremity assist;From bed Sit to Stand Details (indicate cue type and reason): cues for UE use only Stand to Sit: 4: Min assist;With upper extremity assist;To chair/3-in-1 Stand to Sit Details: cues to get closer to chair prior to sitting and control descent.   Ambulation/Gait Ambulation/Gait: Yes Ambulation/Gait Assistance: 4: Min assist Ambulation/Gait Assistance Details (indicate cue type and reason): cues for positioning in RW, upright posture, sequencing.   Ambulation Distance (Feet): 140 Feet Assistive device: Rolling walker Gait Pattern: Step-through pattern;Decreased stride length;Trunk flexed Stairs: No Wheelchair Mobility Wheelchair Mobility: No  Posture/Postural Control Posture/Postural Control: No significant limitations Balance Balance Assessed: No Exercise    End of Session PT - End of Session Equipment Utilized During Treatment: Gait belt;Back brace Activity Tolerance: Patient tolerated treatment well Patient left: in chair;with call bell in reach;with family/visitor present Nurse Communication: Mobility status for transfers;Mobility status for ambulation General Behavior During Session: [facility] for tasks performed Cognition: [facility] for tasks performed  Sunny Schlein, [place] [phone] 09/04/2011, 11:08 AM

## 2011-09-04 NOTE — Evaluation (Signed)
Occupational Therapy Evaluation Patient Details Name: Janice Pace MRN: [id number] DOB: April 12, 1946 Today's Date: 09/04/2011  Problem List: There is no problem list on file for this patient.   Past Medical History:  Past Medical History  Diagnosis Date  . Cancer     dermatosivros arcoma and protuverans  . Heart murmur   . Chronic back pain     degenerative disc disease  . GERD (gastroesophageal reflux disease)     takes Prilosec daily  . Constipation     stool softener daily  . Hemorrhoids   . Nocturia   . Insomnia     d/t chantix   Past Surgical History:  Past Surgical History  Procedure Date  . Tubal ligation 1974  . Abdominal hysterectomy   . Bunionectomy     bilateral  . Back surgery 1988  . Rectocele repair   . Colonoscopy   . Wrist surgery     left    OT Assessment/Plan/Recommendation OT Recommendation Follow Up Recommendations: No OT follow up Equipment Recommended: Rolling walker with 5" wheels;3 in 1 bedside comode  OT Evaluation Precautions/Restrictions  Precautions Precautions: Back Precaution Booklet Issued: Yes (comment) Precaution Comments: handout present in room Required Braces or Orthoses: Yes Spinal Brace: Lumbar corset;Applied in sitting position Restrictions Weight Bearing Restrictions: No Prior Functioning Home Living Lives With: Spouse Receives Help From: Family Type of Home: House Home Layout: One level Home Access: Stairs to enter Entrance Stairs-Rails: Left Entrance Stairs-Number of Steps: 2 Bathroom Shower/Tub: Health visitor: Standard Bathroom Accessibility: Yes How Accessible: Accessible via walker Home Adaptive Equipment: Shower chair without back;Straight cane Prior Function Level of Independence: Needs assistance with ADLs;Needs assistance with homemaking;Independent with gait;Independent with transfers Able to Take Stairs?: Yes Driving: No Vocation: On disability Vocation Requirements:  Accounting ADL ADL Eating/Feeding: Performed;Modified independent Where Assessed - Eating/Feeding: Chair Grooming: Performed;Teeth care;Modified independent Where Assessed - Grooming: Standing at sink Lower Body Dressing: Simulated;Modified independent Where Assessed - Lower Body Dressing: Sitting, chair (crossing BIL LE) Toilet Transfer: Simulated;Modified independent Toilet Transfer Method: Proofreader: Raised toilet seat with arms (or 3-in-1 over toilet) Toileting - Clothing Manipulation: Simulated;Supervision/safety Where Assessed - Toileting Clothing Manipulation: Sit to stand from 3-in-1 or toilet Toileting - Hygiene: Not assessed Tub/Shower Transfer: Performed;Supervision/safety Tub/Shower Transfer Method: Ambulating Equipment Used: Rolling walker Ambulation Related to ADLs: Pt ambulated to restroom Mod I level ADL Comments: Pt is able to cross bil LE and touch toes. Pt and daughter educated on bed mobility. Daughter return demonstrating on OT. Pt is a adequate level for d/c home Vision/Perception  Vision - History Baseline Vision: No visual deficits Patient Visual Report: No change from baseline Cognition Cognition Orientation Level: Oriented X4 Sensation/Coordination Coordination Gross Motor Movements are Fluid and Coordinated: Yes Fine Motor Movements are Fluid and Coordinated: Yes Extremity Assessment RUE Assessment RUE Assessment: Within Functional Limits (grossly) LUE Assessment LUE Assessment: Within Functional Limits (grossly) Mobility  Bed Mobility Bed Mobility: No Transfers Transfers: Yes Sit to Stand: 6: Modified independent (Device/Increase time);With upper extremity assist;From chair/3-in-1 Sit to Stand Details (indicate cue type and reason): no cues needed correct hand positioning, excellent recall of PT session Stand to Sit: 6: Modified independent (Device/Increase time);With upper extremity assist;To chair/3-in-1    End of  Session OT - End of Session Equipment Utilized During Treatment: Gait belt;Back brace Activity Tolerance: Patient tolerated treatment well Patient left: in chair;with call bell in reach;with family/visitor present (daughter) Nurse Communication: Mobility status for transfers;Mobility status for ambulation  General Behavior During Session: [facility] for tasks performed Cognition: [facility] for tasks performed   Lucile Shutters 09/04/2011, 2:21 PM  Pager: [phone]

## 2011-09-04 NOTE — Progress Notes (Signed)
Subjective: Patient reports Patient doing very well sitting up eating breakfast no leg pain back is also her but well-managed on the oral analgesics.  Objective: Vital signs in last 24 hours: Temp:  [97.9 F (36.6 C)-98.8 F (37.1 C)] 98.3 F (36.8 C) (02/21 1838) Pulse Rate:  [89-128] 108  (02/21 1838) Resp:  [17-20] 18  (02/21 1838) BP: (101-124)/(63-76) 110/63 mmHg (02/21 1838) SpO2:  [94 %-99 %] 99 % (02/21 1838) Weight:  [63.957 kg (141 lb)] 63.957 kg (141 lb) (02/20 2149)  Intake/Output from previous day: 02/20 0701 - 02/21 0700 In: 2375 [I.V.:2300] Out: 2895 [Urine:2350; Drains:150; Blood:395] Intake/Output this shift: Total I/O In: -  Out: 2020 [Urine:1800; Drains:220]  Strength is 5 out of 5 wound is clean and dry the  Lab Results:  [facility] 09/02/11 0928  WBC 5.7  HGB 12.6  HCT 36.6  PLT 223   BMET  Basename 09/02/11 0928  NA 137  K 3.9  CL 106  CO2 22  GLUCOSE 102*  BUN 15  CREATININE 0.57  CALCIUM 9.5    Studies/Results: Dg Lumbar Spine 2-3 Views  09/03/2011  *RADIOLOGY REPORT*  Clinical Data: Lumbar fusion.  L4-L5 and L5-S1.  LUMBAR SPINE - 2-3 VIEW  Comparison: None.  Findings: AP and lateral intraoperative views.  These demonstrate L4-L5 and L5-S1 posterior trans pedicle screw fixation.  Interbody fusion material is centrally positioned.  No acute hardware complication identified.  IMPRESSION: Intraoperative imaging of L4-S1 posterior fixation.  Original Report Authenticated By: Consuello Bossier, M.D.    Assessment/Plan: Progressive mobilization posterior day 1 from a PLIF discontinue her Foley, PT  LOS: 1 day     Aliannah Holstrom P 09/04/2011, 6:50 PM

## 2011-09-05 MED ORDER — OXYCODONE-ACETAMINOPHEN 5-325 MG PO TABS: 1.0000 | ORAL_TABLET | ORAL | Status: AC | PRN

## 2011-09-05 MED ORDER — CYCLOBENZAPRINE HCL 10 MG PO TABS: 10.0000 mg | ORAL_TABLET | Freq: Three times a day (TID) | ORAL | Status: AC | PRN

## 2011-09-05 NOTE — Discharge Summary (Signed)
  Physician Discharge Summary  Patient ID: Janice Pace MRN: [id number] DOB/AGE: [birth year] 65 y.o.  Admit date: 09/03/2011 Discharge date: 09/05/2011  Admission Diagnoses: Grade 1 spondylolisthesis and lumbar spinal stenosis L4-5 L5-S1  Discharge Diagnoses: Same Active Problems:  * No active hospital problems. *    Discharged Condition: good  Hospital Course: Patient was admitted to the hospital on Wednesday underwent a decompressive lumbar laminectomy and fusion at L4-5 and L5-S1 postoperatively patient did very well went to recovery in the floor on the floor patient convalesced well pain was well-controlled she was angling and voiding spontaneously the time of discharge patient be discharged upon the proximal one to 2 weeks on oral analgesics. Her last crit on discharge is full strength in her lower extremities her wound is clean and dry.  Consults: Significant Diagnostic Studies: Treatments: Decompressive lumbar laminectomy and posterior lumbar interbody fusion L4-5 L5-S1 the Discharge Exam: Blood pressure 110/71, pulse 113, temperature 98.2 F (36.8 C), temperature source Oral, resp. rate 18, height 5\' 3"  (1.6 m), weight 63.957 kg (141 lb), SpO2 98.00%. Strength is 5 out of 5 wound is clean and dry the  Disposition: Home   Medication List  As of 09/05/2011  1:42 PM   TAKE these medications         ascorbic acid 1000 MG tablet   Commonly known as: VITAMIN C   Take 1,000 mg by mouth daily.      Calcium-Vitamin D 600-200 MG-UNIT Caps   Take 1 capsule by mouth daily.      cyclobenzaprine 10 MG tablet   Commonly known as: FLEXERIL   Take 10 mg by mouth 3 (three) times daily as needed. For muscle spasms      cyclobenzaprine 10 MG tablet   Commonly known as: FLEXERIL   Take 1 tablet (10 mg total) by mouth 3 (three) times daily as needed for muscle spasms.      Garlic 1000 MG Caps   Take 1 capsule by mouth daily.      HAIR/SKIN/NAILS PO   Take 1 capsule by mouth daily.        omeprazole 20 MG capsule   Commonly known as: PRILOSEC   Take 20 mg by mouth daily.      oxyCODONE-acetaminophen 7.5-325 MG per tablet   Commonly known as: PERCOCET   Take 1 tablet by mouth every 4 (four) hours as needed. For pain      oxyCODONE-acetaminophen 5-325 MG per tablet   Commonly known as: PERCOCET   Take 1-2 tablets by mouth every 4 (four) hours as needed.      Potassium Gluconate 595 MG Caps   Take 1 capsule by mouth daily.      varenicline 0.5 MG X 11 & 1 MG X 42 tablet   Commonly known as: CHANTIX PAK   Take by mouth 2 (two) times daily. Take one 0.5 mg tablet by mouth once daily for 3 days, then increase to one 0.5 mg tablet twice daily for 4 days, then increase to one 1 mg tablet twice daily.      Vitamin D 2000 UNITS Caps   Take 2 capsules by mouth daily.           Follow-up Information    Follow up in 1 week.         Signed: Lateia Fraser P 09/05/2011, 1:42 PM

## 2011-09-05 NOTE — Progress Notes (Signed)
Subjective: Patient reports She's doing very well no leg pain back pain is well controlled she's angling and voiding spontaneously  Objective: Vital signs in last 24 hours: Temp:  [98.2 F (36.8 C)-99.6 F (37.6 C)] 98.2 F (36.8 C) (02/22 1025) Pulse Rate:  [108-119] 113  (02/22 1025) Resp:  [18-20] 18  (02/22 1025) BP: (95-114)/(62-72) 110/71 mmHg (02/22 1025) SpO2:  [94 %-99 %] 98 % (02/22 1025)  Intake/Output from previous day: 02/21 0701 - 02/22 0700 In: 415 [P.O.:360] Out: 2130 [Urine:1800; Drains:330] Intake/Output this shift: Total I/O In: 340 [P.O.:340] Out: -   Strength is 5 out of 5 wound is clean and dry the  Lab Results: No results found for this basename: WBC:2,HGB:2,HCT:2,PLT:2 in the last 72 hours BMET No results found for this basename: NA:2,K:2,CL:2,CO2:2,GLUCOSE:2,BUN:2,CREATININE:2,CALCIUM:2 in the last 72 hours  Studies/Results: Dg Lumbar Spine 2-3 Views  09/03/2011  *RADIOLOGY REPORT*  Clinical Data: Lumbar fusion.  L4-L5 and L5-S1.  LUMBAR SPINE - 2-3 VIEW  Comparison: None.  Findings: AP and lateral intraoperative views.  These demonstrate L4-L5 and L5-S1 posterior trans pedicle screw fixation.  Interbody fusion material is centrally positioned.  No acute hardware complication identified.  IMPRESSION: Intraoperative imaging of L4-S1 posterior fixation.  Original Report Authenticated By: Consuello Bossier, M.D.    Assessment/Plan: Posterior day 2 from a pleasant doing very well discharged home scheduled followup in one to 2 weeks.  LOS: 2 days     Rynlee Lisbon P 09/05/2011, 1:39 PM

## 2011-09-05 NOTE — Progress Notes (Signed)
Physical Therapy Treatment Patient Details Name: Janice Pace MRN: [id number] DOB: 1945-09-21 Today's Date: 09/05/2011  PT Assessment/Plan  PT - Assessment/Plan Comments on Treatment Session: pt presents s/p PLIF and making great progress. PT Plan: Discharge plan remains appropriate;Frequency remains appropriate PT Frequency: Min 5X/week Follow Up Recommendations: Home health PT;Supervision - Intermittent Equipment Recommended: Rolling walker with 5" wheels;3 in 1 bedside comode PT Goals  Acute Rehab PT Goals PT Goal: Sit to Stand - Progress: Met PT Goal: Ambulate - Progress: Progressing toward goal PT Goal: Up/Down Stairs - Progress: Progressing toward goal Additional Goals PT Goal: Additional Goal #1 - Progress: Progressing toward goal  PT Treatment Precautions/Restrictions  Precautions Precautions: Back Precaution Booklet Issued: Yes (comment) Precaution Comments: handout present in room Required Braces or Orthoses: Yes Spinal Brace: Lumbar corset;Applied in sitting position Restrictions Weight Bearing Restrictions: No Mobility (including Balance) Bed Mobility Bed Mobility: No Transfers Transfers: Yes Sit to Stand: 6: Modified independent (Device/Increase time);With upper extremity assist;From chair/3-in-1 Stand to Sit: 6: Modified independent (Device/Increase time);With upper extremity assist;To chair/3-in-1 Ambulation/Gait Ambulation/Gait: Yes Ambulation/Gait Assistance: 5: Supervision Ambulation/Gait Assistance Details (indicate cue type and reason): good use of RW Ambulation Distance (Feet): 180 Feet Assistive device: Rolling walker Gait Pattern: Step-through pattern;Decreased stride length;Trunk flexed Stairs: Yes Stairs Assistance: 4: Min assist Stairs Assistance Details (indicate cue type and reason): cue sfo rsafe technique Stair Management Technique: One rail Left;Forwards;Step to pattern Number of Stairs: 2  Wheelchair Mobility Wheelchair Mobility: No    Posture/Postural Control Posture/Postural Control: No significant limitations Balance Balance Assessed: No Exercise    End of Session PT - End of Session Equipment Utilized During Treatment: Gait belt;Back brace Activity Tolerance: Patient tolerated treatment well Patient left: in chair;with call bell in reach;with family/visitor present Nurse Communication: Mobility status for transfers;Mobility status for ambulation General Behavior During Session: [facility] for tasks performed Cognition: [facility] for tasks performed  Sunny Schlein, [place] [phone] 09/05/2011, 1:01 PM

## 2011-09-05 NOTE — Progress Notes (Signed)
Pt. Was discharged home in stable condition with walker and 3 in 1 bedside commode. All discharge instructions, prescriptions and medications were reviewed with pt. Pt left with all belongings.

## 2011-09-05 NOTE — Progress Notes (Signed)
CARE MANAGEMENT NOTE 09/05/2011      Action/Plan:   Discharge planning. Spoke with patient and husband. Contacted worker's comp case manager - Lafonda Mosses [phone]. Instructed to call [facility] to order DME.   Anticipated DC Date:  09/05/2011   Anticipated DC Plan:  HOME/SELF CARE      DC Planning Services  CM consult      PAC Choice  DURABLE MEDICAL EQUIPMENT   Choice offered to / List presented to:     DME arranged  3-N-1  WALKER - ROLLING      DME agency  OTHER - SEE NOTE     HH arranged  NA      HH agency  NA   Status of service:  In process, will continue to follow  Discharge Disposition:  HOME W HOME HEALTH SERVICES   Comments:  09/05/11 1415 Vance Peper, RN BSN Case Manager Patient's adjuster is Jola Baptist [phone], fax [phone].  faxed orders for Rolling walker and 3in1 to [phone].Waiting for authorization to get DME from [facility].

## 2011-09-05 NOTE — Progress Notes (Signed)
Utilization review completed. Janith Nielson, RN, BSN. 09/05/11 

## 2011-09-05 NOTE — Discharge Instructions (Signed)
No lifting no bending no twisting no driving a riding a car and she's come back for the see me wound clean and dry cover with Saran wrap for showers

## 2011-09-08 MED FILL — Sodium Chloride IV Soln 0.9%: INTRAVENOUS | Qty: 1000 | Status: AC

## 2011-09-08 MED FILL — Heparin Sodium (Porcine) Inj 1000 Unit/ML: INTRAMUSCULAR | Qty: 30 | Status: AC

## 2011-10-14 DIAGNOSIS — H40059 Ocular hypertension, unspecified eye: Secondary | ICD-10-CM | POA: Diagnosis not present

## 2011-12-18 DIAGNOSIS — M549 Dorsalgia, unspecified: Secondary | ICD-10-CM | POA: Diagnosis not present

## 2011-12-18 DIAGNOSIS — K219 Gastro-esophageal reflux disease without esophagitis: Secondary | ICD-10-CM | POA: Diagnosis not present

## 2011-12-18 DIAGNOSIS — K5900 Constipation, unspecified: Secondary | ICD-10-CM | POA: Diagnosis not present

## 2012-01-27 DIAGNOSIS — Z780 Asymptomatic menopausal state: Secondary | ICD-10-CM | POA: Diagnosis not present

## 2012-01-27 DIAGNOSIS — Z1211 Encounter for screening for malignant neoplasm of colon: Secondary | ICD-10-CM | POA: Diagnosis not present

## 2012-01-27 DIAGNOSIS — Z23 Encounter for immunization: Secondary | ICD-10-CM | POA: Diagnosis not present

## 2012-01-27 DIAGNOSIS — N898 Other specified noninflammatory disorders of vagina: Secondary | ICD-10-CM | POA: Diagnosis not present

## 2012-01-27 DIAGNOSIS — Z0000 Encounter for general adult medical examination without abnormal findings: Secondary | ICD-10-CM | POA: Diagnosis not present

## 2012-02-16 DIAGNOSIS — Z136 Encounter for screening for cardiovascular disorders: Secondary | ICD-10-CM | POA: Diagnosis not present

## 2012-02-16 DIAGNOSIS — Z780 Asymptomatic menopausal state: Secondary | ICD-10-CM | POA: Diagnosis not present

## 2012-02-16 DIAGNOSIS — Z0000 Encounter for general adult medical examination without abnormal findings: Secondary | ICD-10-CM | POA: Diagnosis not present

## 2012-02-21 DIAGNOSIS — Z1231 Encounter for screening mammogram for malignant neoplasm of breast: Secondary | ICD-10-CM | POA: Diagnosis not present

## 2012-06-17 DIAGNOSIS — Z23 Encounter for immunization: Secondary | ICD-10-CM | POA: Diagnosis not present

## 2012-07-28 ENCOUNTER — Encounter: Payer: Self-pay | Admitting: Internal Medicine

## 2012-08-09 DIAGNOSIS — B351 Tinea unguium: Secondary | ICD-10-CM | POA: Diagnosis not present

## 2012-08-09 DIAGNOSIS — IMO0002 Reserved for concepts with insufficient information to code with codable children: Secondary | ICD-10-CM | POA: Diagnosis not present

## 2012-08-12 DIAGNOSIS — B351 Tinea unguium: Secondary | ICD-10-CM | POA: Diagnosis not present

## 2012-08-12 DIAGNOSIS — L600 Ingrowing nail: Secondary | ICD-10-CM | POA: Diagnosis not present

## 2012-08-12 DIAGNOSIS — L03039 Cellulitis of unspecified toe: Secondary | ICD-10-CM | POA: Diagnosis not present

## 2012-08-23 ENCOUNTER — Ambulatory Visit (AMBULATORY_SURGERY_CENTER): Payer: Medicare Other | Admitting: *Deleted

## 2012-08-23 VITALS — Ht 63.0 in | Wt 177.4 lb

## 2012-08-23 DIAGNOSIS — Z1211 Encounter for screening for malignant neoplasm of colon: Secondary | ICD-10-CM

## 2012-08-23 DIAGNOSIS — Z86010 Personal history of colonic polyps: Secondary | ICD-10-CM

## 2012-08-23 MED ORDER — NA SULFATE-K SULFATE-MG SULF 17.5-3.13-1.6 GM/177ML PO SOLN: ORAL | Status: DC

## 2012-09-02 DIAGNOSIS — L600 Ingrowing nail: Secondary | ICD-10-CM | POA: Diagnosis not present

## 2012-09-06 ENCOUNTER — Encounter: Payer: Self-pay | Admitting: Internal Medicine

## 2012-09-06 ENCOUNTER — Ambulatory Visit (AMBULATORY_SURGERY_CENTER): Payer: Medicare Other | Admitting: Internal Medicine

## 2012-09-06 VITALS — BP 123/74 | HR 74 | Temp 98.1°F | Resp 16 | Ht 63.0 in | Wt 177.0 lb

## 2012-09-06 DIAGNOSIS — K648 Other hemorrhoids: Secondary | ICD-10-CM

## 2012-09-06 DIAGNOSIS — Z86010 Personal history of colonic polyps: Secondary | ICD-10-CM | POA: Diagnosis not present

## 2012-09-06 DIAGNOSIS — K644 Residual hemorrhoidal skin tags: Secondary | ICD-10-CM

## 2012-09-06 DIAGNOSIS — K219 Gastro-esophageal reflux disease without esophagitis: Secondary | ICD-10-CM | POA: Diagnosis not present

## 2012-09-06 DIAGNOSIS — Z1211 Encounter for screening for malignant neoplasm of colon: Secondary | ICD-10-CM

## 2012-09-06 MED ORDER — SODIUM CHLORIDE 0.9 % IV SOLN: 500.0000 mL | INTRAVENOUS | Status: DC

## 2012-09-06 NOTE — Progress Notes (Signed)
YOU HAD AN ENDOSCOPIC PROCEDURE TODAY AT THE [facility]: Refer to the procedure report that was given to you for any specific questions about what was found during the examination.  If the procedure report does not answer your questions, please call your gastroenterologist to clarify.  If you requested that your care partner not be given the details of your procedure findings, then the procedure report has been included in a sealed envelope for you to review at your convenience later.  YOU SHOULD EXPECT: Some feelings of bloating in the abdomen. Passage of more gas than usual.  Walking can help get rid of the air that was put into your GI tract during the procedure and reduce the bloating. If you had a lower endoscopy (such as a colonoscopy or flexible sigmoidoscopy) you may notice spotting of blood in your stool or on the toilet paper. If you underwent a bowel prep for your procedure, then you may not have a normal bowel movement for a few days.  DIET: Your first meal following the procedure should be a light meal and then it is ok to progress to your normal diet.  A half-sandwich or bowl of soup is an example of a good first meal.  Heavy or fried foods are harder to digest and may make you feel nauseous or bloated.  Likewise meals heavy in dairy and vegetables can cause extra gas to form and this can also increase the bloating.  Drink plenty of fluids but you should avoid alcoholic beverages for 24 hours.  ACTIVITY: Your care partner should take you home directly after the procedure.  You should plan to take it easy, moving slowly for the rest of the day.  You can resume normal activity the day after the procedure however you should NOT DRIVE or use heavy machinery for 24 hours (because of the sedation medicines used during the test).    SYMPTOMS TO REPORT IMMEDIATELY: A gastroenterologist can be reached at any hour.  During normal business hours, 8:30 AM to 5:00 PM Monday through Friday,  call [phone].  After hours and on weekends, please call the GI answering service at [phone] who will take a message and have the physician on call contact you.   Following lower endoscopy (colonoscopy or flexible sigmoidoscopy):  Excessive amounts of blood in the stool  Significant tenderness or worsening of abdominal pains  Swelling of the abdomen that is new, acute  Fever of 100F or higher    FOLLOW UP: If any biopsies were taken you will be contacted by phone or by letter within the next 1-3 weeks.  Call your gastroenterologist if you have not heard about the biopsies in 3 weeks.  Our staff will call the home number listed on your records the next business day following your procedure to check on you and address any questions or concerns that you may have at that time regarding the information given to you following your procedure. This is a courtesy call and so if there is no answer at the home number and we have not heard from you through the emergency physician on call, we will assume that you have returned to your regular daily activities without incident.  SIGNATURES/CONFIDENTIALITY: You and/or your care partner have signed paperwork which will be entered into your electronic medical record.  These signatures attest to the fact that that the information above on your After Visit Summary has been reviewed and is understood.  Full responsibility of the confidentiality   of this discharge information lies with you and/or your care-partner.     

## 2012-09-06 NOTE — Progress Notes (Signed)
Patient did not experience any of the following events: a burn prior to discharge; a fall within the facility; wrong site/side/patient/procedure/implant event; or a hospital transfer or hospital admission upon discharge from the facility. (G8907) Patient did not have preoperative order for IV antibiotic SSI prophylaxis. (G8918)  

## 2012-09-06 NOTE — Patient Instructions (Addendum)
No polyps today and you had a great prep!  You do have some hemorrhoids - not usually a significant medical problem.  I recommend that you have another routine preventive colonoscopy in about 10 years - 2024.  Thank you for choosing me and [facility].  Iva Boop, MD, [facility]  Hemorrhoid handout given today. Resume current medications! Call us with any questions or concerns. Thank you!!   YOU HAD AN ENDOSCOPIC PROCEDURE TODAY AT THE [facility]: Refer to the procedure report that was given to you for any specific questions about what was found during the examination.  If the procedure report does not answer your questions, please call your gastroenterologist to clarify.  If you requested that your care partner not be given the details of your procedure findings, then the procedure report has been included in a sealed envelope for you to review at your convenience later.  YOU SHOULD EXPECT: Some feelings of bloating in the abdomen. Passage of more gas than usual.  Walking can help get rid of the air that was put into your GI tract during the procedure and reduce the bloating. If you had a lower endoscopy (such as a colonoscopy or flexible sigmoidoscopy) you may notice spotting of blood in your stool or on the toilet paper. If you underwent a bowel prep for your procedure, then you may not have a normal bowel movement for a few days.  DIET: Your first meal following the procedure should be a light meal and then it is ok to progress to your normal diet.  A half-sandwich or bowl of soup is an example of a good first meal.  Heavy or fried foods are harder to digest and may make you feel nauseous or bloated.  Likewise meals heavy in dairy and vegetables can cause extra gas to form and this can also increase the bloating.  Drink plenty of fluids but you should avoid alcoholic beverages for 24 hours.  ACTIVITY: Your care partner should take you home directly after the  procedure.  You should plan to take it easy, moving slowly for the rest of the day.  You can resume normal activity the day after the procedure however you should NOT DRIVE or use heavy machinery for 24 hours (because of the sedation medicines used during the test).    SYMPTOMS TO REPORT IMMEDIATELY: A gastroenterologist can be reached at any hour.  During normal business hours, 8:30 AM to 5:00 PM Monday through Friday, call [phone].  After hours and on weekends, please call the GI answering service at [phone] who will take a message and have the physician on call contact you.   Following lower endoscopy (colonoscopy or flexible sigmoidoscopy):  Excessive amounts of blood in the stool  Significant tenderness or worsening of abdominal pains  Swelling of the abdomen that is new, acute  Fever of 100F or higher  Following upper endoscopy (EGD)  Vomiting of blood or coffee ground material  New chest pain or pain under the shoulder blades  Painful or persistently difficult swallowing  New shortness of breath  Fever of 100F or higher  Black, tarry-looking stools  FOLLOW UP: If any biopsies were taken you will be contacted by phone or by letter within the next 1-3 weeks.  Call your gastroenterologist if you have not heard about the biopsies in 3 weeks.  Our staff will call the home number listed on your records the next business day following your procedure to check on  you and address any questions or concerns that you may have at that time regarding the information given to you following your procedure. This is a courtesy call and so if there is no answer at the home number and we have not heard from you through the emergency physician on call, we will assume that you have returned to your regular daily activities without incident.  SIGNATURES/CONFIDENTIALITY: You and/or your care partner have signed paperwork which will be entered into your electronic medical record.  These  signatures attest to the fact that that the information above on your After Visit Summary has been reviewed and is understood.  Full responsibility of the confidentiality of this discharge information lies with you and/or your care-partner.

## 2012-09-06 NOTE — Progress Notes (Signed)
Patient did not have preoperative order for IV antibiotic SSI prophylaxis. (G8918)  Patient did not experience any of the following events: a burn prior to discharge; a fall within the facility; wrong site/side/patient/procedure/implant event; or a hospital transfer or hospital admission upon discharge from the facility. (G8907)  

## 2012-09-06 NOTE — Op Note (Signed)
[facility] [address] [place], [address]   COLONOSCOPY PROCEDURE REPORT  PATIENT: Janice Pace, Janice Pace  MR#: [id number] BIRTHDATE: 1946-04-08 , 66  yrs. old GENDER: Female ENDOSCOPIST: Iva Boop, MD, [facility] PROCEDURE DATE:  09/06/2012 PROCEDURE:   Colonoscopy, screening ASA CLASS:   Class II INDICATIONS:Screening and surveillance,personal history of colonic polyps.   5 mm adenoma removed 2004 MEDICATIONS: propofol (Diprivan) 200mg  IV, MAC sedation, administered by CRNA, and These medications were titrated to patient response per physician's verbal order  DESCRIPTION OF PROCEDURE:   After the risks benefits and alternatives of the procedure were thoroughly explained, informed consent was obtained.  A digital rectal exam revealed external hemorrhoids and A digital rectal exam revealed no rectal mass. The LB [id number]-[id number]  endoscope was introduced through the anus and advanced to the cecum, which was identified by both the appendix and ileocecal valve. No adverse events experienced.   The quality of the prep was Suprep excellent  The instrument was then slowly withdrawn as the colon was fully examined.      COLON FINDINGS: A normal appearing cecum, ileocecal valve, and appendiceal orifice were identified.  The ascending, hepatic flexure, transverse, splenic flexure, descending, sigmoid colon and rectum appeared unremarkable.  No polyps or cancers were seen. Moderate sized internal and external hemorrhoids were found. Retroflexed views revealed internal/external hemorrhoids. The time to cecum=2 minutes 13 seconds.  Withdrawal time=5 minutes 16 seconds.  The scope was withdrawn and the procedure completed. COMPLICATIONS: There were no complications.  ENDOSCOPIC IMPRESSION: 1.   Normal colon 2.   Moderate sized internal and external hemorrhoids in anorectum  RECOMMENDATIONS: Repeat colonoscopy 10 years.   eSigned:  Iva Boop, MD, [facility]  09/06/2012 11:43 AM   cc: Elby Showers, MD and The Patient

## 2012-09-07 ENCOUNTER — Telehealth: Payer: Self-pay | Admitting: *Deleted

## 2012-09-07 NOTE — Telephone Encounter (Signed)
  Follow up Call-  Call back number 09/06/2012  Post procedure Call Back phone  # [phone]  Permission to leave phone message Yes     Patient questions:  Do you have a fever, pain , or abdominal swelling? no Pain Score  0 *  Have you tolerated food without any problems? yes  Have you been able to return to your normal activities? yes  Do you have any questions about your discharge instructions: Diet   no Medications  no Follow up visit  no  Do you have questions or concerns about your Care? no  Actions: * If pain score is 4 or above: No action needed, pain <4.

## 2012-09-23 ENCOUNTER — Encounter (INDEPENDENT_AMBULATORY_CARE_PROVIDER_SITE_OTHER): Payer: Medicare Other | Admitting: Vascular Surgery

## 2012-09-23 DIAGNOSIS — I739 Peripheral vascular disease, unspecified: Secondary | ICD-10-CM

## 2013-03-01 DIAGNOSIS — H40059 Ocular hypertension, unspecified eye: Secondary | ICD-10-CM | POA: Diagnosis not present

## 2013-04-25 DIAGNOSIS — R609 Edema, unspecified: Secondary | ICD-10-CM | POA: Diagnosis not present

## 2013-04-25 DIAGNOSIS — F411 Generalized anxiety disorder: Secondary | ICD-10-CM | POA: Diagnosis not present

## 2013-04-25 DIAGNOSIS — R7989 Other specified abnormal findings of blood chemistry: Secondary | ICD-10-CM | POA: Diagnosis not present

## 2013-04-25 DIAGNOSIS — R635 Abnormal weight gain: Secondary | ICD-10-CM | POA: Diagnosis not present

## 2013-04-25 DIAGNOSIS — K219 Gastro-esophageal reflux disease without esophagitis: Secondary | ICD-10-CM | POA: Diagnosis not present

## 2013-04-25 DIAGNOSIS — Z0000 Encounter for general adult medical examination without abnormal findings: Secondary | ICD-10-CM | POA: Diagnosis not present

## 2013-04-30 DIAGNOSIS — Z1231 Encounter for screening mammogram for malignant neoplasm of breast: Secondary | ICD-10-CM | POA: Diagnosis not present

## 2013-06-27 DIAGNOSIS — Z23 Encounter for immunization: Secondary | ICD-10-CM | POA: Diagnosis not present

## 2014-01-17 DIAGNOSIS — J029 Acute pharyngitis, unspecified: Secondary | ICD-10-CM | POA: Diagnosis not present

## 2014-01-17 DIAGNOSIS — J069 Acute upper respiratory infection, unspecified: Secondary | ICD-10-CM | POA: Diagnosis not present

## 2014-01-17 DIAGNOSIS — R0982 Postnasal drip: Secondary | ICD-10-CM | POA: Diagnosis not present

## 2014-04-10 ENCOUNTER — Other Ambulatory Visit: Payer: Self-pay | Admitting: Internal Medicine

## 2014-04-10 DIAGNOSIS — R1013 Epigastric pain: Secondary | ICD-10-CM | POA: Diagnosis not present

## 2014-04-10 DIAGNOSIS — F411 Generalized anxiety disorder: Secondary | ICD-10-CM | POA: Diagnosis not present

## 2014-04-10 DIAGNOSIS — H40059 Ocular hypertension, unspecified eye: Secondary | ICD-10-CM | POA: Diagnosis not present

## 2014-04-19 ENCOUNTER — Other Ambulatory Visit: Payer: Medicare Other

## 2014-04-25 ENCOUNTER — Ambulatory Visit
Admission: RE | Admit: 2014-04-25 | Discharge: 2014-04-25 | Disposition: A | Discharge status: Home / Self Care | Payer: Medicare Other | Source: Ambulatory Visit | Attending: Internal Medicine | Admitting: Internal Medicine

## 2014-04-25 DIAGNOSIS — R1013 Epigastric pain: Secondary | ICD-10-CM

## 2014-04-25 DIAGNOSIS — R112 Nausea with vomiting, unspecified: Secondary | ICD-10-CM | POA: Diagnosis not present

## 2014-04-26 ENCOUNTER — Other Ambulatory Visit: Payer: Self-pay | Admitting: Internal Medicine

## 2014-04-26 DIAGNOSIS — E781 Pure hyperglyceridemia: Secondary | ICD-10-CM | POA: Diagnosis not present

## 2014-04-26 DIAGNOSIS — R109 Unspecified abdominal pain: Secondary | ICD-10-CM | POA: Diagnosis not present

## 2014-04-26 DIAGNOSIS — M8580 Other specified disorders of bone density and structure, unspecified site: Secondary | ICD-10-CM | POA: Diagnosis not present

## 2014-04-26 DIAGNOSIS — R1084 Generalized abdominal pain: Secondary | ICD-10-CM

## 2014-04-26 DIAGNOSIS — Z1389 Encounter for screening for other disorder: Secondary | ICD-10-CM | POA: Diagnosis not present

## 2014-04-26 DIAGNOSIS — Z0001 Encounter for general adult medical examination with abnormal findings: Secondary | ICD-10-CM | POA: Diagnosis not present

## 2014-04-26 DIAGNOSIS — Z23 Encounter for immunization: Secondary | ICD-10-CM | POA: Diagnosis not present

## 2014-04-26 DIAGNOSIS — R011 Cardiac murmur, unspecified: Secondary | ICD-10-CM | POA: Diagnosis not present

## 2014-04-28 ENCOUNTER — Other Ambulatory Visit (HOSPITAL_COMMUNITY): Payer: Self-pay | Admitting: Radiology

## 2014-04-28 ENCOUNTER — Ambulatory Visit (HOSPITAL_COMMUNITY): Payer: Medicare Other | Attending: Internal Medicine | Admitting: Radiology

## 2014-04-28 DIAGNOSIS — R011 Cardiac murmur, unspecified: Secondary | ICD-10-CM

## 2014-04-28 DIAGNOSIS — R010 Benign and innocent cardiac murmurs: Secondary | ICD-10-CM

## 2014-04-28 DIAGNOSIS — R7301 Impaired fasting glucose: Secondary | ICD-10-CM | POA: Diagnosis not present

## 2014-04-28 NOTE — Progress Notes (Signed)
Echocardiogram performed.  

## 2014-05-08 ENCOUNTER — Ambulatory Visit
Admission: RE | Admit: 2014-05-08 | Discharge: 2014-05-08 | Disposition: A | Discharge status: Home / Self Care | Payer: Medicare Other | Source: Ambulatory Visit | Attending: Internal Medicine | Admitting: Internal Medicine

## 2014-05-08 DIAGNOSIS — R110 Nausea: Secondary | ICD-10-CM | POA: Diagnosis not present

## 2014-05-08 DIAGNOSIS — E278 Other specified disorders of adrenal gland: Secondary | ICD-10-CM | POA: Diagnosis not present

## 2014-05-08 DIAGNOSIS — R109 Unspecified abdominal pain: Secondary | ICD-10-CM | POA: Diagnosis not present

## 2014-05-08 DIAGNOSIS — R1084 Generalized abdominal pain: Secondary | ICD-10-CM

## 2014-05-08 MED ORDER — IOHEXOL 300 MG/ML  SOLN
100.0000 mL | Freq: Once | INTRAMUSCULAR | Status: AC | PRN
  Administered 2014-05-08: 100 mL via INTRAVENOUS

## 2014-05-29 DIAGNOSIS — Z1231 Encounter for screening mammogram for malignant neoplasm of breast: Secondary | ICD-10-CM | POA: Diagnosis not present

## 2014-05-29 DIAGNOSIS — M8580 Other specified disorders of bone density and structure, unspecified site: Secondary | ICD-10-CM | POA: Diagnosis not present

## 2014-06-27 DIAGNOSIS — R234 Changes in skin texture: Secondary | ICD-10-CM | POA: Diagnosis not present

## 2014-06-27 DIAGNOSIS — E278 Other specified disorders of adrenal gland: Secondary | ICD-10-CM | POA: Diagnosis not present

## 2014-06-27 DIAGNOSIS — L301 Dyshidrosis [pompholyx]: Secondary | ICD-10-CM | POA: Diagnosis not present

## 2014-06-27 DIAGNOSIS — K219 Gastro-esophageal reflux disease without esophagitis: Secondary | ICD-10-CM | POA: Diagnosis not present

## 2014-09-07 ENCOUNTER — Other Ambulatory Visit: Payer: Self-pay | Admitting: Internal Medicine

## 2014-09-07 DIAGNOSIS — E279 Disorder of adrenal gland, unspecified: Secondary | ICD-10-CM | POA: Diagnosis not present

## 2014-09-07 DIAGNOSIS — E278 Other specified disorders of adrenal gland: Secondary | ICD-10-CM

## 2014-09-08 DIAGNOSIS — E279 Disorder of adrenal gland, unspecified: Secondary | ICD-10-CM | POA: Diagnosis not present

## 2014-09-15 ENCOUNTER — Other Ambulatory Visit: Payer: Worker's Compensation

## 2014-09-20 ENCOUNTER — Ambulatory Visit
Admission: RE | Admit: 2014-09-20 | Discharge: 2014-09-20 | Disposition: A | Discharge status: Home / Self Care | Payer: Self-pay | Source: Ambulatory Visit | Attending: Internal Medicine | Admitting: Internal Medicine

## 2014-09-20 DIAGNOSIS — E279 Disorder of adrenal gland, unspecified: Secondary | ICD-10-CM | POA: Diagnosis not present

## 2014-09-20 DIAGNOSIS — D3501 Benign neoplasm of right adrenal gland: Secondary | ICD-10-CM | POA: Diagnosis not present

## 2014-09-20 DIAGNOSIS — E278 Other specified disorders of adrenal gland: Secondary | ICD-10-CM

## 2014-09-20 DIAGNOSIS — D3502 Benign neoplasm of left adrenal gland: Secondary | ICD-10-CM | POA: Diagnosis not present

## 2014-09-28 DIAGNOSIS — E279 Disorder of adrenal gland, unspecified: Secondary | ICD-10-CM | POA: Diagnosis not present

## 2014-10-13 DIAGNOSIS — E279 Disorder of adrenal gland, unspecified: Secondary | ICD-10-CM | POA: Diagnosis not present

## 2014-12-05 ENCOUNTER — Encounter: Payer: Self-pay | Admitting: Podiatry

## 2014-12-05 ENCOUNTER — Ambulatory Visit (INDEPENDENT_AMBULATORY_CARE_PROVIDER_SITE_OTHER): Payer: Medicare Other | Admitting: Podiatry

## 2014-12-05 VITALS — BP 141/86 | HR 86 | Resp 12

## 2014-12-05 DIAGNOSIS — L309 Dermatitis, unspecified: Secondary | ICD-10-CM

## 2014-12-05 DIAGNOSIS — L600 Ingrowing nail: Secondary | ICD-10-CM

## 2014-12-05 NOTE — Patient Instructions (Signed)

## 2014-12-05 NOTE — Progress Notes (Signed)
   Subjective:    Patient ID: Janice Pace, female    DOB: [birth year], 69 y.o.   MRN: [id number]  HPI  PT STATED B/L BOTTOM FOOT HAVE DRY/CRACK SKIN FOR 6 MONTHS. FEET ARE GETTING WORSE AND VERY PAINFUL TO WALK. TRIED OTC FOOT CREAM BUT NO HELP.  Review of Systems  Cardiovascular: Positive for leg swelling.  Skin: Positive for color change.       Objective:   Physical Exam        Assessment & Plan:

## 2014-12-06 NOTE — Progress Notes (Signed)
Subjective:     Patient ID: Janice Pace, female   DOB: 11/21/1945, 69 y.o.   MRN: [id number]  HPI patient presents stating I got painful nailbeds and I have crack skin worse on my right foot than my left foot with bottom of the right foot   Review of Systems  All other systems reviewed and are negative.      Objective:   Physical Exam  Cardiovascular: Intact distal pulses.   Musculoskeletal: Normal range of motion.  Neurological: She is alert.  Skin: Skin is warm and dry.  Nursing note and vitals reviewed.  neurovascular status found to be intact with muscle strength adequate range of motion subtalar midtarsal joint within normal limits. Patient is noted to have crack right midfoot and dry skin on the heels of both feet that is relatively painful when pressed and has been present for a fairly long time. Is noted to have a damage right fourth nail second left that are incurvated and sore when pressed and patient has tried to trim them and soak them without relief     Assessment:     Damaged nailbeds fourth right second left with incurvation of the corners and dry irritated skin type bilateral    Plan:     H&P and condition reviewed with patient. Today I have recommended removal of the nailbeds and explained surgery and then also the utilization of Vaseline Saran wrap white socks to try to control the dry scan along with healthy feet cream. She wants the procedures done and I explained the risk and today infiltrated each toe with 60 mg like Marcaine mixture exposed the nailbeds themselves and remove the nails exposing matrix and applying phenol 3 applications 30 seconds followed by alcohol lavage and sterile dressing. Gave instructions on soaks and reappoint

## 2015-04-02 DIAGNOSIS — R300 Dysuria: Secondary | ICD-10-CM | POA: Diagnosis not present

## 2015-05-01 DIAGNOSIS — H40053 Ocular hypertension, bilateral: Secondary | ICD-10-CM | POA: Diagnosis not present

## 2015-05-02 DIAGNOSIS — M8588 Other specified disorders of bone density and structure, other site: Secondary | ICD-10-CM | POA: Diagnosis not present

## 2015-05-02 DIAGNOSIS — I700 Atherosclerosis of aorta: Secondary | ICD-10-CM | POA: Diagnosis not present

## 2015-05-02 DIAGNOSIS — F411 Generalized anxiety disorder: Secondary | ICD-10-CM | POA: Diagnosis not present

## 2015-05-02 DIAGNOSIS — Z23 Encounter for immunization: Secondary | ICD-10-CM | POA: Diagnosis not present

## 2015-05-02 DIAGNOSIS — Z1389 Encounter for screening for other disorder: Secondary | ICD-10-CM | POA: Diagnosis not present

## 2015-05-02 DIAGNOSIS — Z6832 Body mass index (BMI) 32.0-32.9, adult: Secondary | ICD-10-CM | POA: Diagnosis not present

## 2015-05-02 DIAGNOSIS — Z79899 Other long term (current) drug therapy: Secondary | ICD-10-CM | POA: Diagnosis not present

## 2015-05-02 DIAGNOSIS — Z0000 Encounter for general adult medical examination without abnormal findings: Secondary | ICD-10-CM | POA: Diagnosis not present

## 2015-05-02 DIAGNOSIS — E781 Pure hyperglyceridemia: Secondary | ICD-10-CM | POA: Diagnosis not present

## 2015-05-31 DIAGNOSIS — Z1231 Encounter for screening mammogram for malignant neoplasm of breast: Secondary | ICD-10-CM | POA: Diagnosis not present

## 2015-06-05 DIAGNOSIS — Z1231 Encounter for screening mammogram for malignant neoplasm of breast: Secondary | ICD-10-CM | POA: Diagnosis not present

## 2015-06-05 DIAGNOSIS — R922 Inconclusive mammogram: Secondary | ICD-10-CM | POA: Diagnosis not present

## 2015-06-05 DIAGNOSIS — R928 Other abnormal and inconclusive findings on diagnostic imaging of breast: Secondary | ICD-10-CM | POA: Diagnosis not present

## 2015-09-01 DIAGNOSIS — N3090 Cystitis, unspecified without hematuria: Secondary | ICD-10-CM | POA: Diagnosis not present

## 2015-09-01 DIAGNOSIS — R350 Frequency of micturition: Secondary | ICD-10-CM | POA: Diagnosis not present

## 2015-10-15 IMAGING — CT CT ABDOMEN W/O CM
2 of 4 series · 17 of 46 positions shown, 19 images · IV contrast (READICAT/WATER)
Comparison: 05/08/2014

CLINICAL DATA: Follow up adrenal gland nodule.

EXAM:
CT ABDOMEN WITHOUT CONTRAST
TECHNIQUE: Multidetector CT imaging of the abdomen was performed following the
standard protocol without IV contrast.

[Series 2: adrenal w/o · axial · non-contrast · 0.84mm/px · z∈[-271,-1]mm · 14 of 118 slices shown, 16 images]
[im 5/118  soft-tissue]
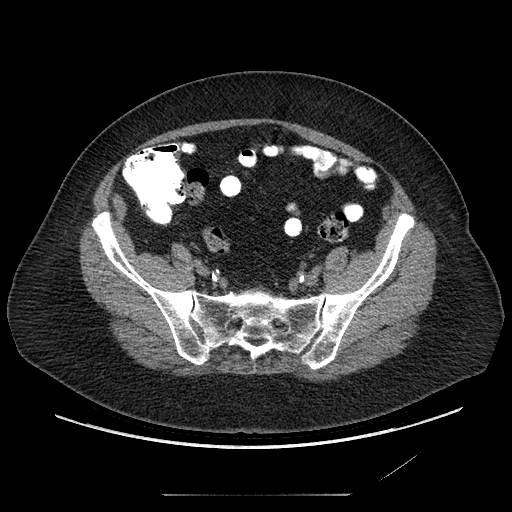
[im 5/118  bone]
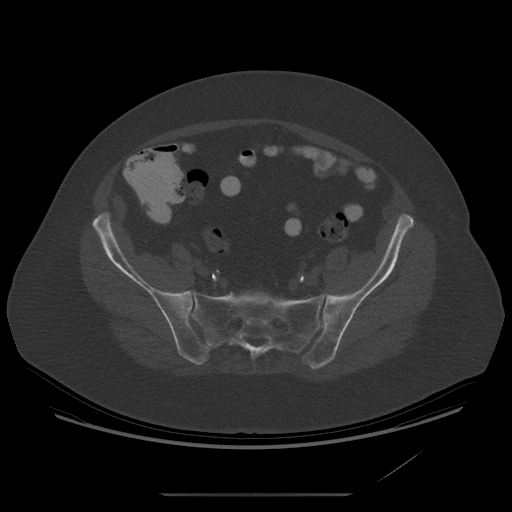
[im 14/118  soft-tissue]
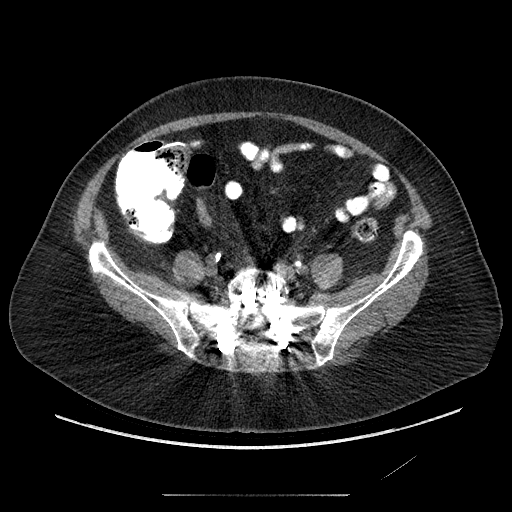
[im 22/118  soft-tissue]
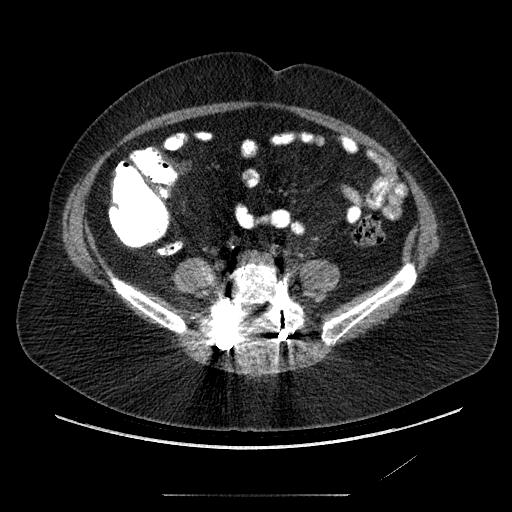
[im 31/118  soft-tissue]
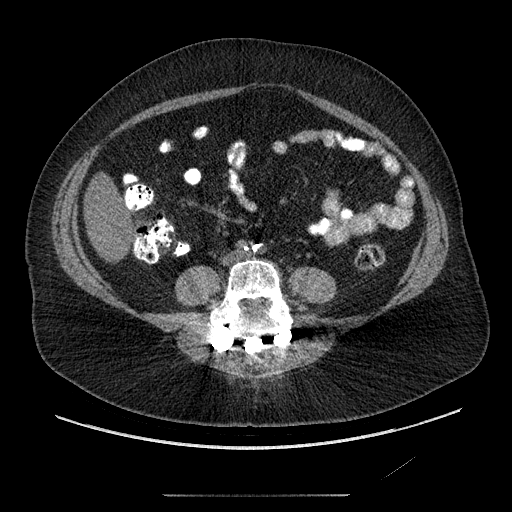
[im 40/118  soft-tissue]
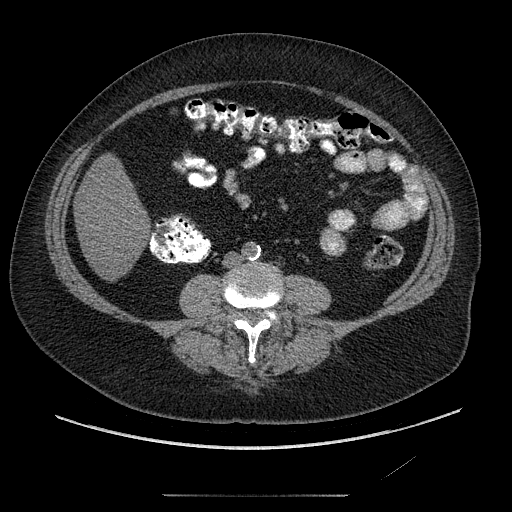
[im 48/118  soft-tissue]
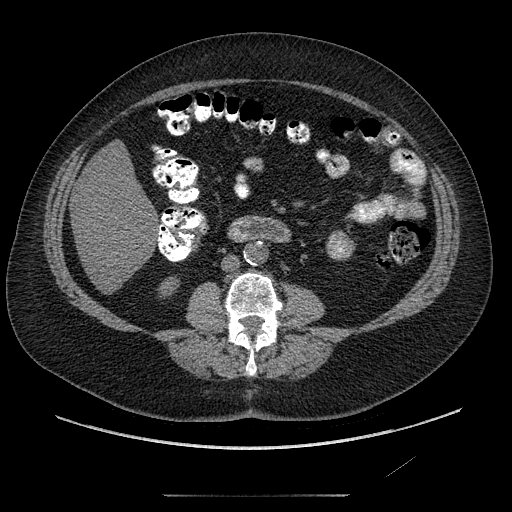
[im 57/118  soft-tissue]
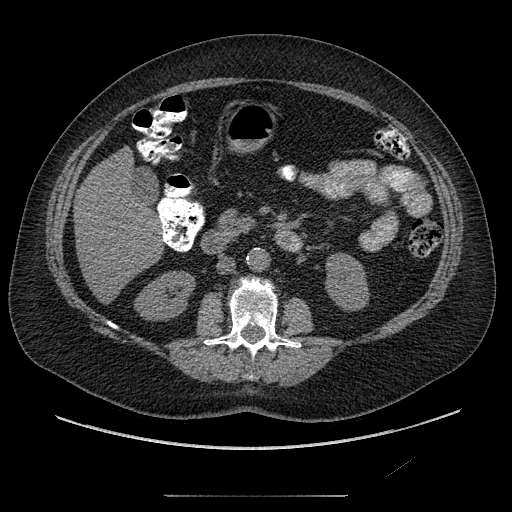
[im 61/118  soft-tissue]
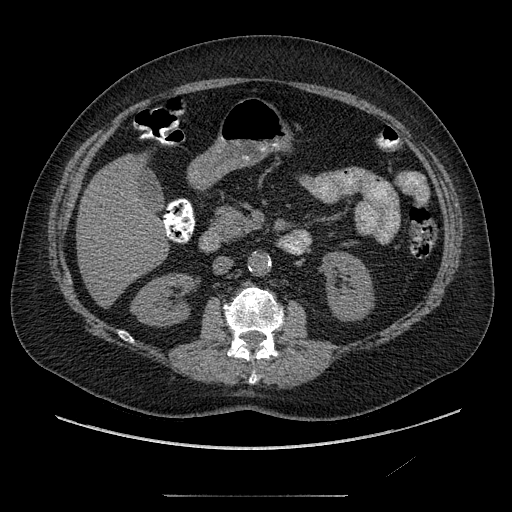
[im 70/118  soft-tissue]
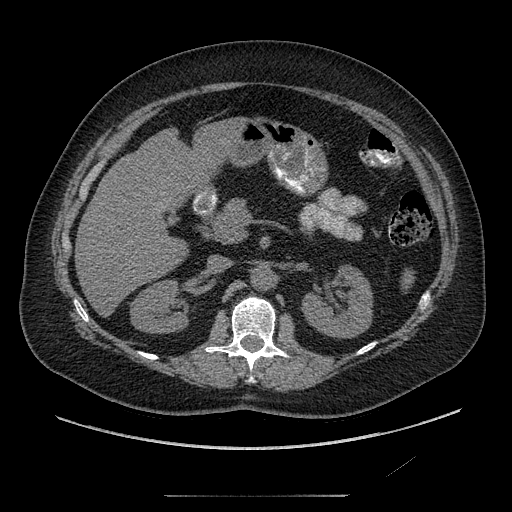
[im 70/118  bone]
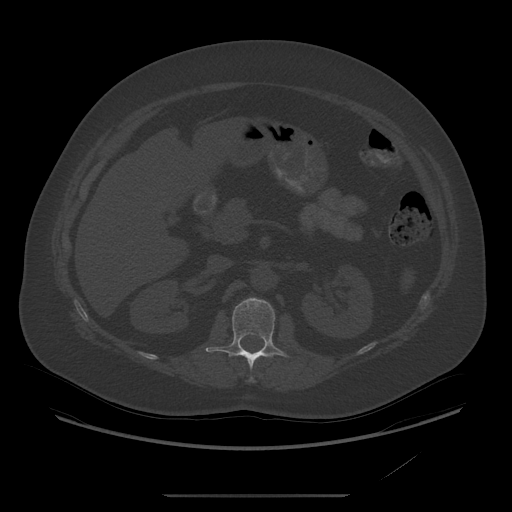
[im 79/118  soft-tissue]
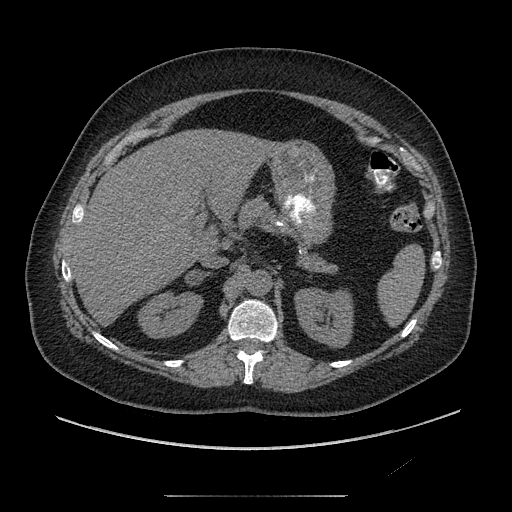
[im 87/118  soft-tissue]
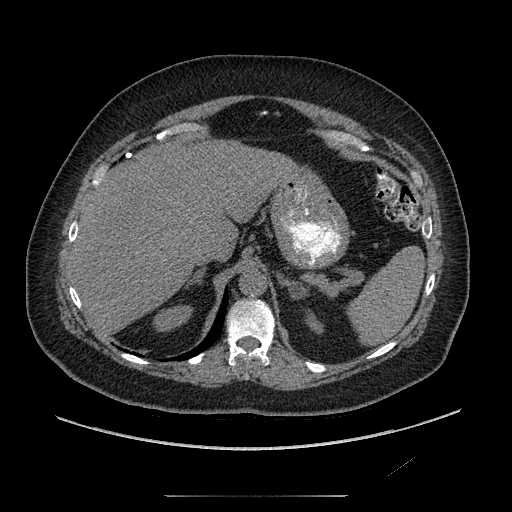
[im 96/118  soft-tissue]
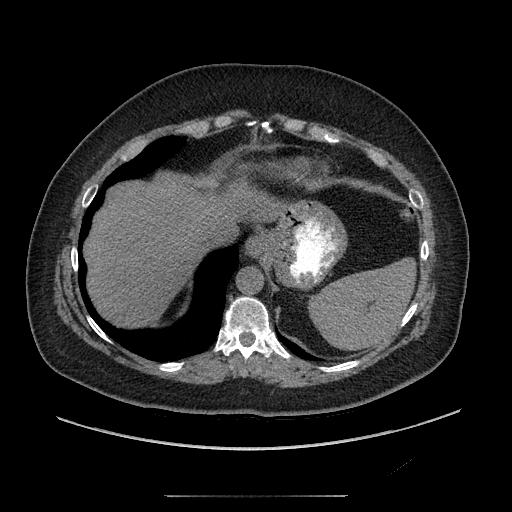
[im 105/118  soft-tissue]
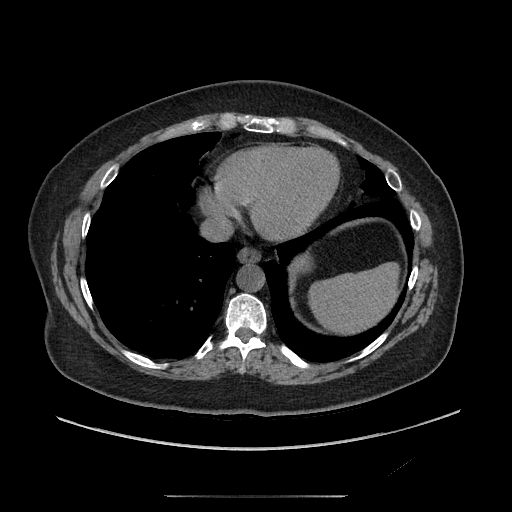
[im 113/118  soft-tissue]
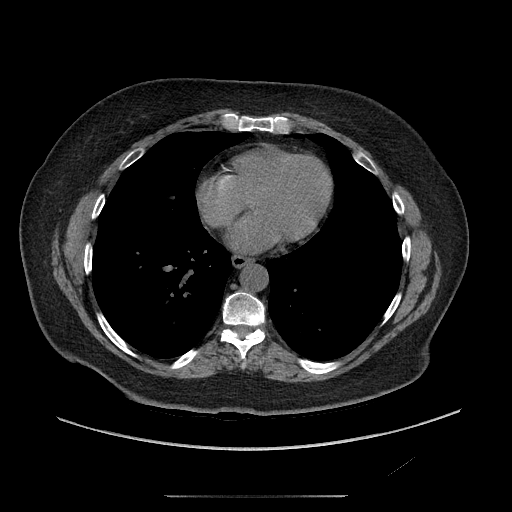

[Series 302: cor · coronal · 0.84mm/px · 3 of 140 slices shown]
[im 47/140  soft-tissue]
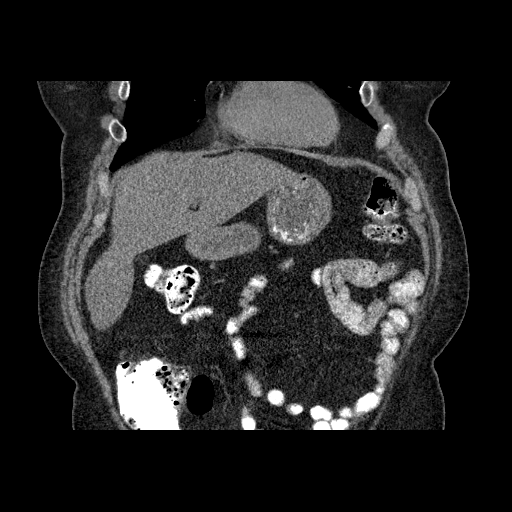
[im 62/140  soft-tissue]
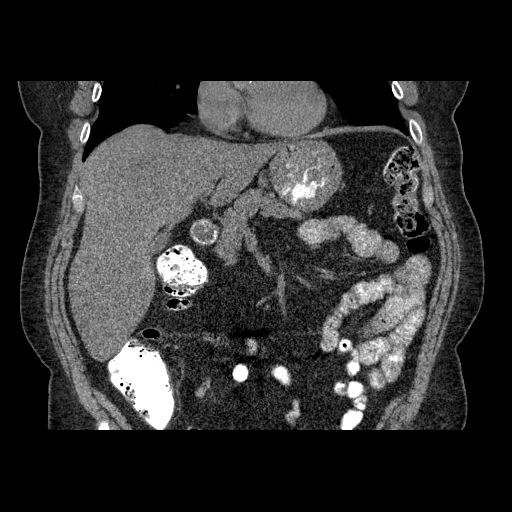
[im 78/140  soft-tissue]
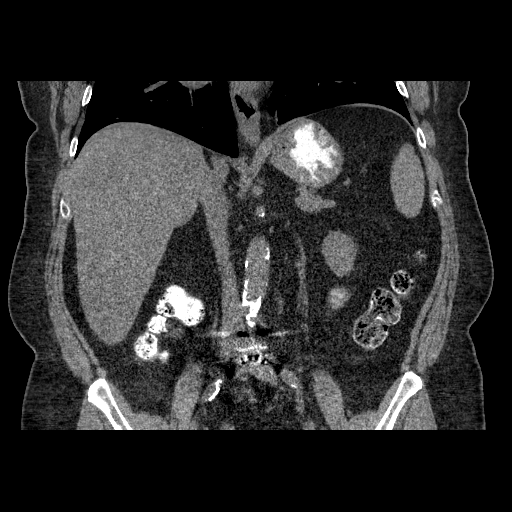

[17 of 46 positions shown; findings below may reference images not displayed]

FINDINGS: A small calcification is again seen in the right middle lobe with
minimal scarring in the middle lobe and lingula. There is no pleural
effusion.

Mild liver enlargement versus a right else lobe is unchanged. The
gallbladder, spleen, kidneys, and pancreas have an unremarkable
unenhanced appearance. Bilateral adrenal gland nodules measure
cm in size, unchanged, with attenuation consistent with adenomas.

Oral contrast is present in nondilated loops of small large bowel,
incompletely visualized. There is no evidence of bowel obstruction.
Moderate atherosclerotic vascular calcification is noted. No
enlarged lymph nodes are identified. Sequelae of prior lumbosacral
fusion are again identified.
IMPRESSION: Bilateral adrenal adenomas.

## 2015-11-01 DIAGNOSIS — E279 Disorder of adrenal gland, unspecified: Secondary | ICD-10-CM | POA: Diagnosis not present

## 2016-01-17 ENCOUNTER — Encounter: Payer: Self-pay | Admitting: Podiatry

## 2016-04-16 DIAGNOSIS — H40053 Ocular hypertension, bilateral: Secondary | ICD-10-CM | POA: Diagnosis not present

## 2016-05-08 DIAGNOSIS — Z0000 Encounter for general adult medical examination without abnormal findings: Secondary | ICD-10-CM | POA: Diagnosis not present

## 2016-05-08 DIAGNOSIS — Z23 Encounter for immunization: Secondary | ICD-10-CM | POA: Diagnosis not present

## 2016-05-08 DIAGNOSIS — E781 Pure hyperglyceridemia: Secondary | ICD-10-CM | POA: Diagnosis not present

## 2016-05-08 DIAGNOSIS — Z780 Asymptomatic menopausal state: Secondary | ICD-10-CM | POA: Diagnosis not present

## 2016-05-08 DIAGNOSIS — M810 Age-related osteoporosis without current pathological fracture: Secondary | ICD-10-CM | POA: Diagnosis not present

## 2016-05-08 DIAGNOSIS — F411 Generalized anxiety disorder: Secondary | ICD-10-CM | POA: Diagnosis not present

## 2016-05-08 DIAGNOSIS — I700 Atherosclerosis of aorta: Secondary | ICD-10-CM | POA: Diagnosis not present

## 2016-05-08 DIAGNOSIS — R739 Hyperglycemia, unspecified: Secondary | ICD-10-CM | POA: Diagnosis not present

## 2016-05-08 DIAGNOSIS — Z6832 Body mass index (BMI) 32.0-32.9, adult: Secondary | ICD-10-CM | POA: Diagnosis not present

## 2016-05-08 DIAGNOSIS — M8580 Other specified disorders of bone density and structure, unspecified site: Secondary | ICD-10-CM | POA: Diagnosis not present

## 2016-05-08 DIAGNOSIS — Z6841 Body Mass Index (BMI) 40.0 and over, adult: Secondary | ICD-10-CM | POA: Diagnosis not present

## 2016-05-08 DIAGNOSIS — R7303 Prediabetes: Secondary | ICD-10-CM | POA: Diagnosis not present

## 2016-05-08 DIAGNOSIS — N6009 Solitary cyst of unspecified breast: Secondary | ICD-10-CM | POA: Diagnosis not present

## 2016-05-08 DIAGNOSIS — Z86010 Personal history of colonic polyps: Secondary | ICD-10-CM | POA: Diagnosis not present

## 2016-05-08 DIAGNOSIS — E279 Disorder of adrenal gland, unspecified: Secondary | ICD-10-CM | POA: Diagnosis not present

## 2016-05-08 DIAGNOSIS — E669 Obesity, unspecified: Secondary | ICD-10-CM | POA: Diagnosis not present

## 2016-05-08 DIAGNOSIS — Z1389 Encounter for screening for other disorder: Secondary | ICD-10-CM | POA: Diagnosis not present

## 2016-05-08 DIAGNOSIS — E7800 Pure hypercholesterolemia, unspecified: Secondary | ICD-10-CM | POA: Diagnosis not present

## 2016-05-08 DIAGNOSIS — R232 Flushing: Secondary | ICD-10-CM | POA: Diagnosis not present

## 2016-05-23 DIAGNOSIS — H40053 Ocular hypertension, bilateral: Secondary | ICD-10-CM | POA: Diagnosis not present

## 2016-06-02 DIAGNOSIS — Z1231 Encounter for screening mammogram for malignant neoplasm of breast: Secondary | ICD-10-CM | POA: Diagnosis not present

## 2016-10-31 ENCOUNTER — Other Ambulatory Visit: Payer: Self-pay | Admitting: Internal Medicine

## 2016-10-31 DIAGNOSIS — E279 Disorder of adrenal gland, unspecified: Principal | ICD-10-CM

## 2016-10-31 DIAGNOSIS — E278 Other specified disorders of adrenal gland: Secondary | ICD-10-CM | POA: Diagnosis not present

## 2016-10-31 DIAGNOSIS — R7303 Prediabetes: Secondary | ICD-10-CM | POA: Diagnosis not present

## 2016-11-04 ENCOUNTER — Ambulatory Visit
Admission: RE | Admit: 2016-11-04 | Discharge: 2016-11-04 | Disposition: A | Discharge status: Home / Self Care | Payer: Medicare Other | Source: Ambulatory Visit | Attending: Internal Medicine | Admitting: Internal Medicine

## 2016-11-04 DIAGNOSIS — E278 Other specified disorders of adrenal gland: Secondary | ICD-10-CM

## 2016-11-04 DIAGNOSIS — E279 Disorder of adrenal gland, unspecified: Secondary | ICD-10-CM | POA: Diagnosis not present

## 2016-11-06 DIAGNOSIS — M85869 Other specified disorders of bone density and structure, unspecified lower leg: Secondary | ICD-10-CM | POA: Diagnosis not present

## 2016-11-06 DIAGNOSIS — F411 Generalized anxiety disorder: Secondary | ICD-10-CM | POA: Diagnosis not present

## 2016-11-06 DIAGNOSIS — Z6834 Body mass index (BMI) 34.0-34.9, adult: Secondary | ICD-10-CM | POA: Diagnosis not present

## 2016-11-06 DIAGNOSIS — R1013 Epigastric pain: Secondary | ICD-10-CM | POA: Diagnosis not present

## 2016-11-06 DIAGNOSIS — R7303 Prediabetes: Secondary | ICD-10-CM | POA: Diagnosis not present

## 2016-11-06 DIAGNOSIS — E279 Disorder of adrenal gland, unspecified: Secondary | ICD-10-CM | POA: Diagnosis not present

## 2016-11-06 DIAGNOSIS — E669 Obesity, unspecified: Secondary | ICD-10-CM | POA: Diagnosis not present

## 2016-11-06 DIAGNOSIS — E781 Pure hyperglyceridemia: Secondary | ICD-10-CM | POA: Diagnosis not present

## 2016-11-12 DIAGNOSIS — I2510 Atherosclerotic heart disease of native coronary artery without angina pectoris: Secondary | ICD-10-CM | POA: Diagnosis not present

## 2016-11-12 DIAGNOSIS — K760 Fatty (change of) liver, not elsewhere classified: Secondary | ICD-10-CM | POA: Diagnosis not present

## 2016-11-12 DIAGNOSIS — I700 Atherosclerosis of aorta: Secondary | ICD-10-CM | POA: Diagnosis not present

## 2016-11-12 DIAGNOSIS — Z6834 Body mass index (BMI) 34.0-34.9, adult: Secondary | ICD-10-CM | POA: Diagnosis not present

## 2016-11-12 DIAGNOSIS — E669 Obesity, unspecified: Secondary | ICD-10-CM | POA: Diagnosis not present

## 2016-11-12 DIAGNOSIS — R7303 Prediabetes: Secondary | ICD-10-CM | POA: Diagnosis not present

## 2016-11-12 DIAGNOSIS — D3500 Benign neoplasm of unspecified adrenal gland: Secondary | ICD-10-CM | POA: Diagnosis not present

## 2016-11-27 DIAGNOSIS — M8588 Other specified disorders of bone density and structure, other site: Secondary | ICD-10-CM | POA: Diagnosis not present

## 2017-02-23 DIAGNOSIS — E669 Obesity, unspecified: Secondary | ICD-10-CM | POA: Diagnosis not present

## 2017-02-23 DIAGNOSIS — Z79899 Other long term (current) drug therapy: Secondary | ICD-10-CM | POA: Diagnosis not present

## 2017-02-23 DIAGNOSIS — Z6834 Body mass index (BMI) 34.0-34.9, adult: Secondary | ICD-10-CM | POA: Diagnosis not present

## 2017-02-23 DIAGNOSIS — I700 Atherosclerosis of aorta: Secondary | ICD-10-CM | POA: Diagnosis not present

## 2017-02-23 DIAGNOSIS — R7309 Other abnormal glucose: Secondary | ICD-10-CM | POA: Diagnosis not present

## 2017-02-23 DIAGNOSIS — M549 Dorsalgia, unspecified: Secondary | ICD-10-CM | POA: Diagnosis not present

## 2017-02-23 DIAGNOSIS — D3500 Benign neoplasm of unspecified adrenal gland: Secondary | ICD-10-CM | POA: Diagnosis not present

## 2017-02-23 DIAGNOSIS — K760 Fatty (change of) liver, not elsewhere classified: Secondary | ICD-10-CM | POA: Diagnosis not present

## 2017-02-23 DIAGNOSIS — Z5181 Encounter for therapeutic drug level monitoring: Secondary | ICD-10-CM | POA: Diagnosis not present

## 2017-02-23 DIAGNOSIS — I2510 Atherosclerotic heart disease of native coronary artery without angina pectoris: Secondary | ICD-10-CM | POA: Diagnosis not present

## 2017-03-09 DIAGNOSIS — R300 Dysuria: Secondary | ICD-10-CM | POA: Diagnosis not present

## 2017-03-09 DIAGNOSIS — N3000 Acute cystitis without hematuria: Secondary | ICD-10-CM | POA: Diagnosis not present

## 2017-04-27 DIAGNOSIS — E119 Type 2 diabetes mellitus without complications: Secondary | ICD-10-CM | POA: Diagnosis not present

## 2017-05-25 DIAGNOSIS — F411 Generalized anxiety disorder: Secondary | ICD-10-CM | POA: Diagnosis not present

## 2017-05-25 DIAGNOSIS — R7303 Prediabetes: Secondary | ICD-10-CM | POA: Diagnosis not present

## 2017-05-25 DIAGNOSIS — Z0000 Encounter for general adult medical examination without abnormal findings: Secondary | ICD-10-CM | POA: Diagnosis not present

## 2017-05-25 DIAGNOSIS — Z7189 Other specified counseling: Secondary | ICD-10-CM | POA: Diagnosis not present

## 2017-05-25 DIAGNOSIS — R0609 Other forms of dyspnea: Secondary | ICD-10-CM | POA: Diagnosis not present

## 2017-05-25 DIAGNOSIS — Z1389 Encounter for screening for other disorder: Secondary | ICD-10-CM | POA: Diagnosis not present

## 2017-05-25 DIAGNOSIS — I700 Atherosclerosis of aorta: Secondary | ICD-10-CM | POA: Diagnosis not present

## 2017-05-25 DIAGNOSIS — Z23 Encounter for immunization: Secondary | ICD-10-CM | POA: Diagnosis not present

## 2017-05-25 DIAGNOSIS — E781 Pure hyperglyceridemia: Secondary | ICD-10-CM | POA: Diagnosis not present

## 2017-06-01 DIAGNOSIS — R0609 Other forms of dyspnea: Secondary | ICD-10-CM | POA: Diagnosis not present

## 2017-06-03 ENCOUNTER — Encounter (HOSPITAL_COMMUNITY): Payer: Self-pay

## 2017-06-09 DIAGNOSIS — Z1231 Encounter for screening mammogram for malignant neoplasm of breast: Secondary | ICD-10-CM | POA: Diagnosis not present

## 2017-06-18 DIAGNOSIS — N6011 Diffuse cystic mastopathy of right breast: Secondary | ICD-10-CM | POA: Diagnosis not present

## 2017-06-30 ENCOUNTER — Encounter (HOSPITAL_COMMUNITY): Payer: Self-pay

## 2017-09-01 ENCOUNTER — Other Ambulatory Visit: Payer: Self-pay | Admitting: Internal Medicine

## 2017-09-01 DIAGNOSIS — M259 Joint disorder, unspecified: Secondary | ICD-10-CM

## 2017-09-01 DIAGNOSIS — R7303 Prediabetes: Secondary | ICD-10-CM | POA: Diagnosis not present

## 2017-09-01 DIAGNOSIS — D3500 Benign neoplasm of unspecified adrenal gland: Secondary | ICD-10-CM | POA: Diagnosis not present

## 2017-09-01 DIAGNOSIS — I700 Atherosclerosis of aorta: Secondary | ICD-10-CM | POA: Diagnosis not present

## 2017-09-01 DIAGNOSIS — K760 Fatty (change of) liver, not elsewhere classified: Secondary | ICD-10-CM | POA: Diagnosis not present

## 2017-09-01 DIAGNOSIS — Z5181 Encounter for therapeutic drug level monitoring: Secondary | ICD-10-CM | POA: Diagnosis not present

## 2017-09-01 DIAGNOSIS — I2510 Atherosclerotic heart disease of native coronary artery without angina pectoris: Secondary | ICD-10-CM | POA: Diagnosis not present

## 2017-09-03 ENCOUNTER — Ambulatory Visit
Admission: RE | Admit: 2017-09-03 | Discharge: 2017-09-03 | Disposition: A | Discharge status: Home / Self Care | Payer: Medicare Other | Source: Ambulatory Visit | Attending: Internal Medicine | Admitting: Internal Medicine

## 2017-09-03 DIAGNOSIS — M67432 Ganglion, left wrist: Secondary | ICD-10-CM | POA: Diagnosis not present

## 2017-09-03 DIAGNOSIS — M259 Joint disorder, unspecified: Secondary | ICD-10-CM

## 2017-09-10 ENCOUNTER — Encounter (INDEPENDENT_AMBULATORY_CARE_PROVIDER_SITE_OTHER): Payer: Self-pay | Admitting: Orthopaedic Surgery

## 2017-09-10 ENCOUNTER — Other Ambulatory Visit (INDEPENDENT_AMBULATORY_CARE_PROVIDER_SITE_OTHER): Payer: Self-pay

## 2017-09-10 ENCOUNTER — Ambulatory Visit (INDEPENDENT_AMBULATORY_CARE_PROVIDER_SITE_OTHER): Payer: Medicare Other | Admitting: Orthopaedic Surgery

## 2017-09-10 ENCOUNTER — Ambulatory Visit (INDEPENDENT_AMBULATORY_CARE_PROVIDER_SITE_OTHER): Payer: Medicare Other

## 2017-09-10 VITALS — BP 147/68 | HR 84 | Resp 14 | Ht 63.0 in | Wt 185.0 lb

## 2017-09-10 DIAGNOSIS — G5602 Carpal tunnel syndrome, left upper limb: Secondary | ICD-10-CM

## 2017-09-10 DIAGNOSIS — M79632 Pain in left forearm: Secondary | ICD-10-CM | POA: Diagnosis not present

## 2017-09-10 DIAGNOSIS — M25532 Pain in left wrist: Secondary | ICD-10-CM

## 2017-09-10 NOTE — Progress Notes (Signed)
Office Visit Note   Patient: Janice Pace           Date of Birth: March 15, 1946           MRN: [id number] Visit Date: 09/10/2017              Requested by: Delrae Rend, MD [address] [place], [place] [address] PCP: Leeroy Cha, MD   Assessment & Plan: Visit Diagnoses:  1. Left forearm pain   2. Carpal tunnel syndrome, left upper limb     Plan: Symptoms are consistent with carpal tunnel syndrome. 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 There does not appear to be any recurrence. I will order EMGs and nerve conduction studies of the left upper extremity. She will wear a volar wrist splint in the meantime  Follow-Up Instructions: Return will schedule EMG's.   Orders:  Orders Placed This Encounter  Procedures  . XR Forearm Left   No orders of the defined types were placed in this encounter.     Procedures: No procedures performed   Clinical Data: No additional findings.   Subjective: Chief Complaint  Patient presents with  . Left Forearm - Pain, Edema    Janice Pace is a 72 y o here for recurrent LL forearm dx. Of arm cancer. Hx of left forearm surgery 1981, 2004.  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 Her surgery was in 1981 the second 2004. She's had some residual paresthesias from the radial nerve branch in the first dorsal webspace as the tumors were wrapped around the nerve. She recently within the last several months has had some pain in her left hand associated with numbness and tingling particularly at night. She's not noticed any mass formation in the area of her old scar. She denies any history of injury or trauma. Easily had an ultrasound of her wrist that was essentially "negative".  HPI  Review of Systems  Constitutional: Negative for chills, fatigue and fever.  HENT: Negative for hearing loss and tinnitus.     Eyes: Negative for itching.  Respiratory: Negative for chest tightness and shortness of breath.   Cardiovascular: Negative for chest pain, palpitations and leg swelling.  Gastrointestinal: Negative for blood in stool, constipation and diarrhea.  Endocrine: Negative for polyuria.  Genitourinary: Negative for dysuria.  Musculoskeletal: Negative for back pain, joint swelling, neck pain and neck stiffness.  Allergic/Immunologic: Negative for immunocompromised state.  Neurological: Negative for dizziness, numbness and headaches.  Hematological: Does not bruise/bleed easily.  Psychiatric/Behavioral: Negative for sleep disturbance. The patient is not nervous/anxious.      Objective: Vital Signs: BP (!) 147/68   Pulse 84   Resp 14   Ht 5\' 3"  (1.6 m)   Wt 185 lb (83.9 kg)   BMI 32.77 kg/m   Physical Exam  Ortho Exam awake alert and oriented 3. Comfortable sitting. Scar along the distal radius dorsally left wrist is healed without problem. There is some paresthesias along the radial nerve branch resulting from her old surgery. Skin otherwise intact. I did not feel any masses. She has active dorsiflexion or volar flexion of all of her different digits and wrists. She did have a positive Tinel's and Phalen's over the median nerve. Good grip and good release. Normal opposition of thumb to little finger. Thenar musculature intact.  Specialty Comments:  No specialty comments available.  Imaging: Xr Forearm  Left  Result Date: 09/10/2017 Films of the left forearm obtained AP and lateral projection. There was no evidence of any abnormality at the elbow or the wrist. No evidence of fracture or subluxation. No change in the bony morphology. No evidence of arthritis in the carpus except with minimal degenerative change at the base of the thumb no ectopic calcification    PMFS History: Patient Active Problem List   Diagnosis Date Noted  . Personal history of colonic adenoma 03/13/2003   Past  Medical History:  Diagnosis Date  . Cancer ([place])    dermatosivros arcoma and protuverans  . Chronic back pain    degenerative disc disease  . Constipation    stool softener daily  . GERD (gastroesophageal reflux disease)    takes Prilosec daily  . GERD (gastroesophageal reflux disease)   . Heart murmur   . Hemorrhoids   . Insomnia    d/t chantix  . Nocturia   . Personal history of colonic adenoma 03/13/2003   03/13/2003 - 5 mm adenoma    Family History  Problem Relation Age of Onset  . Anesthesia problems Neg Hx   . Hypotension Neg Hx   . Malignant hyperthermia Neg Hx   . Pseudochol deficiency Neg Hx   . Colon cancer Neg Hx     Past Surgical History:  Procedure Laterality Date  . ABDOMINAL HYSTERECTOMY    . BACK SURGERY  1988  . BACK SURGERY  09-03-11   rods,screws x 8   . BUNIONECTOMY     bilateral  . BUNIONECTOMY     bil feet  . COLONOSCOPY    . PARTIAL HYSTERECTOMY    . RECTOCELE REPAIR    . Rectum Repair    . RESECTION TUMOR WRIST RADICAL     x 2 rt  . TUBAL LIGATION  1974  . TUBAL LIGATION    . WRIST SURGERY     left   Social History   Occupational History  . Not on file  Tobacco Use  . Smoking status: Former Smoker    Packs/day: 1.00    Years: 50.00    Pack years: 50.00    Types: Cigarettes    Last attempt to quit: 08/17/2011    Years since quitting: 6.0  . Smokeless tobacco: Never Used  . Tobacco comment: 1 wk ago;taking chantix  Substance and Sexual Activity  . Alcohol use: No  . Drug use: No  . Sexual activity: Yes    Birth control/protection: Surgical, Other-see comments

## 2017-10-23 ENCOUNTER — Encounter (INDEPENDENT_AMBULATORY_CARE_PROVIDER_SITE_OTHER): Payer: Self-pay | Admitting: Physical Medicine and Rehabilitation

## 2017-10-23 ENCOUNTER — Ambulatory Visit (INDEPENDENT_AMBULATORY_CARE_PROVIDER_SITE_OTHER): Payer: Medicare Other | Admitting: Physical Medicine and Rehabilitation

## 2017-10-23 DIAGNOSIS — R202 Paresthesia of skin: Secondary | ICD-10-CM | POA: Diagnosis not present

## 2017-10-23 NOTE — Progress Notes (Signed)
 .  Numeric Pain Rating Scale and Functional Assessment Average Pain 7   In the last MONTH (on 0-10 scale) has pain interfered with the following?  1. General activity like being  able to carry out your everyday physical activities such as walking, climbing stairs, carrying groceries, or moving a chair?  Rating(3)   +Driver, -BT, -Dye Allergies.  

## 2017-10-28 NOTE — Progress Notes (Signed)
Janice Pace - 72 y.o. female MRN [id number]  Date of birth: 1945-10-12  Office Visit Note: Visit Date: 10/23/2017 PCP: Leeroy Cha, MD Referred by: Leeroy Cha,*  Subjective: Chief Complaint  Patient presents with  . Left Arm - Pain, Numbness, Tingling   HPI: Janice Pace is a 72 year old right-hand-dominant female who comes in today at the request of Dr. Durward Fortes for electrodiagnostic study of the left upper extremity.  She reports several months of pain numbness and tingling in the left arm.  She reports fairly constant symptoms worse with the weather.  She has been using a brace for the left wrist which has helped at times.  Medications have not been too helpful.  She has not had prior electrodiagnostic studies.  She is a type II diabetic.  Dr. Durward Fortes has performed surgery on her left wrist twice for dermatofibroma sarcoma. Her surgery was in 1981 the second 2004. She's had some residual paresthesias from the radial nerve branch in the first dorsal webspace.  X-rays and ultrasound evaluation is been unrevealing.  She does have a history of multiple lumbar surgeries.  She denies any cervical neck pain or radicular pattern per se.   ROS Otherwise per HPI.  Assessment & Plan: Visit Diagnoses:  1. Paresthesia of skin     Plan: No additional findings.  Impression: The above electrodiagnostic study is ABNORMAL and reveals evidence of a mild left median nerve entrapment at the wrist (carpal tunnel syndrome) affecting sensory components.   There is no significant electrodiagnostic evidence of any other focal nerve entrapment, brachial plexopathy or cervical radiculopathy.   As you know, this particular electrodiagnostic study cannot rule out chemical radiculitis or sensory only radiculopathy.  Recommendations: 1.  Follow-up with referring physician. 2.  Continue current management of symptoms. 3.  Continue use of resting splint at night-time and as needed  during the day.  Meds & Orders: No orders of the defined types were placed in this encounter.   Orders Placed This Encounter  Procedures  . NCV with EMG (electromyography)    Follow-up: Return for Dr. Durward Fortes.   Procedures: No procedures performed  EMG & NCV Findings: Evaluation of the left median (across palm) sensory nerve showed no response (Palm) and prolonged distal peak latency (4.0 ms).  All remaining nerves (as indicated in the following tables) were within normal limits.  All left vs. right side differences were within normal limits.    All examined muscles (as indicated in the following table) showed no evidence of electrical instability.    Impression: The above electrodiagnostic study is ABNORMAL and reveals evidence of a mild left median nerve entrapment at the wrist (carpal tunnel syndrome) affecting sensory components.   There is no significant electrodiagnostic evidence of any other focal nerve entrapment, brachial plexopathy or cervical radiculopathy.   As you know, this particular electrodiagnostic study cannot rule out chemical radiculitis or sensory only radiculopathy.  Recommendations: 1.  Follow-up with referring physician. 2.  Continue current management of symptoms. 3.  Continue use of resting splint at night-time and as needed during the day.   Nerve Conduction Studies Anti Sensory Summary Table   Stim Site NR Peak (ms) Norm Peak (ms) P-T Amp (V) Norm P-T Amp Site1 Site2 Delta-P (ms) Dist (cm) Vel (m/s) Norm Vel (m/s)  Left Median Acr Palm Anti Sensory (2nd Digit)  32.3C  Wrist    *4.0 <3.6 20.3 >10 Wrist Palm  0.0    Palm *NR  <2.0  Right Median Acr Palm Anti Sensory (2nd Digit)  32.1C  Wrist    3.6 <3.6 22.5 >10 Wrist Palm 1.9 0.0    Palm    1.7 <2.0 24.1         Left Radial Anti Sensory (Base 1st Digit)  32.6C  Wrist    1.5 <3.1 4.9  Wrist Base 1st Digit 1.5 0.0    Left Ulnar Anti Sensory (5th Digit)  32.6C  Wrist    3.2 <3.7 20.5  >15.0 Wrist 5th Digit 3.2 14.0 44 >38   Motor Summary Table   Stim Site NR Onset (ms) Norm Onset (ms) O-P Amp (mV) Norm O-P Amp Site1 Site2 Delta-0 (ms) Dist (cm) Vel (m/s) Norm Vel (m/s)  Left Median Motor (Abd Poll Brev)  32.6C  Wrist    3.5 <4.2 5.6 >5 Elbow Wrist 3.8 20.0 53 >50  Elbow    7.3  2.6         Right Median Motor (Abd Poll Brev)  32.3C  Wrist    3.5 <4.2 5.8 >5 Elbow Wrist 3.6 19.5 54 >50  Elbow    7.1  6.1         Left Ulnar Motor (Abd Dig Min)  32.7C  Wrist    2.7 <4.2 8.5 >3 B Elbow Wrist 3.2 19.0 59 >53  B Elbow    5.9  7.0  A Elbow B Elbow 1.4 10.0 71 >53  A Elbow    7.3  6.1          EMG   Side Muscle Nerve Root Ins Act Fibs Psw Amp Dur Poly Recrt Int Fraser Din Comment  Left Abd Poll Brev Median C8-T1 Nml Nml Nml Nml Nml 0 Nml Nml   Left 1stDorInt Ulnar C8-T1 Nml Nml Nml Nml Nml 0 Nml Nml   Left PronatorTeres Median C6-7 Nml Nml Nml Nml Nml 0 Nml Nml   Left Biceps Musculocut C5-6 Nml Nml Nml Nml Nml 0 Nml Nml   Left Deltoid Axillary C5-6 Nml Nml Nml Nml Nml 0 Nml Nml     Nerve Conduction Studies Anti Sensory Left/Right Comparison   Stim Site L Lat (ms) R Lat (ms) L-R Lat (ms) L Amp (V) R Amp (V) L-R Amp (%) Site1 Site2 L Vel (m/s) R Vel (m/s) L-R Vel (m/s)  Median Acr Palm Anti Sensory (2nd Digit)  32.3C  Wrist *4.0 3.6 0.4 20.3 22.5 9.8 Wrist Palm     Palm  1.7   24.1        Radial Anti Sensory (Base 1st Digit)  32.6C  Wrist 1.5   4.9   Wrist Base 1st Digit     Ulnar Anti Sensory (5th Digit)  32.6C  Wrist 3.2   20.5   Wrist 5th Digit 44     Motor Left/Right Comparison   Stim Site L Lat (ms) R Lat (ms) L-R Lat (ms) L Amp (mV) R Amp (mV) L-R Amp (%) Site1 Site2 L Vel (m/s) R Vel (m/s) L-R Vel (m/s)  Median Motor (Abd Poll Brev)  32.6C  Wrist 3.5 3.5 0.0 5.6 5.8 3.4 Elbow Wrist 53 54 1  Elbow 7.3 7.1 0.2 2.6 6.1 57.4       Ulnar Motor (Abd Dig Min)  32.7C  Wrist 2.7   8.5   B Elbow Wrist 59    B Elbow 5.9   7.0   A Elbow B Elbow 71    A Elbow 7.3    6.1  Waveforms:                Clinical History: No specialty comments available.   She reports that she quit smoking about 6 years ago. Her smoking use included cigarettes. She has a 50.00 pack-year smoking history. She has never used smokeless tobacco. No results for input(s): HGBA1C, LABURIC in the last 8760 hours.  Objective:  VS:  HT:    WT:   BMI:     BP:   HR: bpm  TEMP: ( )  RESP:  Physical Exam  Musculoskeletal:  Inspection reveals well-healed surgical scar and no atrophy of the bilateral APB or FDI or hand intrinsics. There is no swelling, color changes, allodynia or dystrophic changes. There is 5 out of 5 strength in the bilateral wrist extension, finger abduction and long finger flexion. There is intact sensation to light touch in all dermatomal and peripheral nerve distributions.  There is a negative Hoffmann's test bilaterally.    Ortho Exam Imaging: No results found.  Past Medical/Family/Surgical/Social History: Medications & Allergies reviewed per EMR, new medications updated. Patient Active Problem List   Diagnosis Date Noted  . Personal history of colonic adenoma 03/13/2003   Past Medical History:  Diagnosis Date  . Cancer ([place])    dermatosivros arcoma and protuverans  . Chronic back pain    degenerative disc disease  . Constipation    stool softener daily  . GERD (gastroesophageal reflux disease)    takes Prilosec daily  . GERD (gastroesophageal reflux disease)   . Heart murmur   . Hemorrhoids   . Insomnia    d/t chantix  . Nocturia   . Personal history of colonic adenoma 03/13/2003   03/13/2003 - 5 mm adenoma   Family History  Problem Relation Age of Onset  . Anesthesia problems Neg Hx   . Hypotension Neg Hx   . Malignant hyperthermia Neg Hx   . Pseudochol deficiency Neg Hx   . Colon cancer Neg Hx    Past Surgical History:  Procedure Laterality Date  . ABDOMINAL HYSTERECTOMY    . BACK SURGERY  1988  . BACK SURGERY   09-03-11   rods,screws x 8   . BUNIONECTOMY     bilateral  . BUNIONECTOMY     bil feet  . COLONOSCOPY    . PARTIAL HYSTERECTOMY    . RECTOCELE REPAIR    . Rectum Repair    . RESECTION TUMOR WRIST RADICAL     x 2 rt  . TUBAL LIGATION  1974  . TUBAL LIGATION    . WRIST SURGERY     left   Social History   Occupational History  . Not on file  Tobacco Use  . Smoking status: Former Smoker    Packs/day: 1.00    Years: 50.00    Pack years: 50.00    Types: Cigarettes    Last attempt to quit: 08/17/2011    Years since quitting: 6.2  . Smokeless tobacco: Never Used  . Tobacco comment: 1 wk ago;taking chantix  Substance and Sexual Activity  . Alcohol use: No  . Drug use: No  . Sexual activity: Yes    Birth control/protection: Surgical, Other-see comments

## 2017-10-28 NOTE — Procedures (Signed)
EMG & NCV Findings: Evaluation of the left median (across palm) sensory nerve showed no response (Palm) and prolonged distal peak latency (4.0 ms).  All remaining nerves (as indicated in the following tables) were within normal limits.  All left vs. right side differences were within normal limits.    All examined muscles (as indicated in the following table) showed no evidence of electrical instability.    Impression: The above electrodiagnostic study is ABNORMAL and reveals evidence of a mild left median nerve entrapment at the wrist (carpal tunnel syndrome) affecting sensory components.   There is no significant electrodiagnostic evidence of any other focal nerve entrapment, brachial plexopathy or cervical radiculopathy.   As you know, this particular electrodiagnostic study cannot rule out chemical radiculitis or sensory only radiculopathy.  Recommendations: 1.  Follow-up with referring physician. 2.  Continue current management of symptoms. 3.  Continue use of resting splint at night-time and as needed during the day.   Nerve Conduction Studies Anti Sensory Summary Table   Stim Site NR Peak (ms) Norm Peak (ms) P-T Amp (V) Norm P-T Amp Site1 Site2 Delta-P (ms) Dist (cm) Vel (m/s) Norm Vel (m/s)  Left Median Acr Palm Anti Sensory (2nd Digit)  32.3C  Wrist    *4.0 <3.6 20.3 >10 Wrist Palm  0.0    Palm *NR  <2.0          Right Median Acr Palm Anti Sensory (2nd Digit)  32.1C  Wrist    3.6 <3.6 22.5 >10 Wrist Palm 1.9 0.0    Palm    1.7 <2.0 24.1         Left Radial Anti Sensory (Base 1st Digit)  32.6C  Wrist    1.5 <3.1 4.9  Wrist Base 1st Digit 1.5 0.0    Left Ulnar Anti Sensory (5th Digit)  32.6C  Wrist    3.2 <3.7 20.5 >15.0 Wrist 5th Digit 3.2 14.0 44 >38   Motor Summary Table   Stim Site NR Onset (ms) Norm Onset (ms) O-P Amp (mV) Norm O-P Amp Site1 Site2 Delta-0 (ms) Dist (cm) Vel (m/s) Norm Vel (m/s)  Left Median Motor (Abd Poll Brev)  32.6C  Wrist    3.5 <4.2 5.6 >5  Elbow Wrist 3.8 20.0 53 >50  Elbow    7.3  2.6         Right Median Motor (Abd Poll Brev)  32.3C  Wrist    3.5 <4.2 5.8 >5 Elbow Wrist 3.6 19.5 54 >50  Elbow    7.1  6.1         Left Ulnar Motor (Abd Dig Min)  32.7C  Wrist    2.7 <4.2 8.5 >3 B Elbow Wrist 3.2 19.0 59 >53  B Elbow    5.9  7.0  A Elbow B Elbow 1.4 10.0 71 >53  A Elbow    7.3  6.1          EMG   Side Muscle Nerve Root Ins Act Fibs Psw Amp Dur Poly Recrt Int Fraser Din Comment  Left Abd Poll Brev Median C8-T1 Nml Nml Nml Nml Nml 0 Nml Nml   Left 1stDorInt Ulnar C8-T1 Nml Nml Nml Nml Nml 0 Nml Nml   Left PronatorTeres Median C6-7 Nml Nml Nml Nml Nml 0 Nml Nml   Left Biceps Musculocut C5-6 Nml Nml Nml Nml Nml 0 Nml Nml   Left Deltoid Axillary C5-6 Nml Nml Nml Nml Nml 0 Nml Nml     Nerve Conduction Studies Anti  Sensory Left/Right Comparison   Stim Site L Lat (ms) R Lat (ms) L-R Lat (ms) L Amp (V) R Amp (V) L-R Amp (%) Site1 Site2 L Vel (m/s) R Vel (m/s) L-R Vel (m/s)  Median Acr Palm Anti Sensory (2nd Digit)  32.3C  Wrist *4.0 3.6 0.4 20.3 22.5 9.8 Wrist Palm     Palm  1.7   24.1        Radial Anti Sensory (Base 1st Digit)  32.6C  Wrist 1.5   4.9   Wrist Base 1st Digit     Ulnar Anti Sensory (5th Digit)  32.6C  Wrist 3.2   20.5   Wrist 5th Digit 44     Motor Left/Right Comparison   Stim Site L Lat (ms) R Lat (ms) L-R Lat (ms) L Amp (mV) R Amp (mV) L-R Amp (%) Site1 Site2 L Vel (m/s) R Vel (m/s) L-R Vel (m/s)  Median Motor (Abd Poll Brev)  32.6C  Wrist 3.5 3.5 0.0 5.6 5.8 3.4 Elbow Wrist 53 54 1  Elbow 7.3 7.1 0.2 2.6 6.1 57.4       Ulnar Motor (Abd Dig Min)  32.7C  Wrist 2.7   8.5   B Elbow Wrist 59    B Elbow 5.9   7.0   A Elbow B Elbow 71    A Elbow 7.3   6.1            Waveforms:

## 2017-11-06 ENCOUNTER — Ambulatory Visit (INDEPENDENT_AMBULATORY_CARE_PROVIDER_SITE_OTHER): Payer: Medicare Other | Admitting: Orthopaedic Surgery

## 2017-11-06 ENCOUNTER — Encounter (INDEPENDENT_AMBULATORY_CARE_PROVIDER_SITE_OTHER): Payer: Self-pay | Admitting: Orthopaedic Surgery

## 2017-11-06 VITALS — BP 144/79 | HR 79 | Resp 16 | Ht 63.0 in | Wt 183.0 lb

## 2017-11-06 DIAGNOSIS — G5602 Carpal tunnel syndrome, left upper limb: Secondary | ICD-10-CM

## 2017-11-06 HISTORY — DX: Carpal tunnel syndrome, left upper limb: G56.02

## 2017-11-06 NOTE — Progress Notes (Signed)
Office Visit Note   Patient: Janice Pace           Date of Birth: 05-02-1946           MRN: [id number] Visit Date: 11/06/2017              Requested by: Leeroy Cha, MD [address] [place], [place] [address] PCP: Leeroy Cha, MD   Assessment & Plan: Visit Diagnoses:  1. Carpal tunnel syndrome, left upper limb     Plan: EMGs nerve conduction studies are consistent with mild carpal tunnel syndrome left wrist.  Long discussion with Mr. and Janice Pace.  Will wear a volar wrist splint.  In future can consider cortisone injections or surgery.  Questions were answered.  Janice Pace is not interested in surgery at this point  Follow-Up Instructions: Return if symptoms worsen or fail to improve.   Orders:  No orders of the defined types were placed in this encounter.  No orders of the defined types were placed in this encounter.     Procedures: No procedures performed   Clinical Data: No additional findings.   Subjective: Chief Complaint  Patient presents with  . Left Wrist - Numbness  . Wrist Pain    Left wrist numbness, NCV  Has had EMGs and nerve conduction studies the left upper extremity and here for the results.  Notes as above  HPI  Review of Systems  Constitutional: Positive for activity change.  HENT: Negative for trouble swallowing.   Eyes: Negative for pain.  Respiratory: Positive for shortness of breath.   Cardiovascular: Positive for leg swelling.  Gastrointestinal: Negative for constipation.  Endocrine: Negative for cold intolerance.  Genitourinary: Negative for difficulty urinating.  Musculoskeletal: Negative for joint swelling.  Skin: Negative for rash.  Allergic/Immunologic: Negative for food allergies.  Neurological: Positive for weakness and numbness.  Hematological: Does not bruise/bleed easily.  Psychiatric/Behavioral: Positive for sleep disturbance.     Objective: Vital Signs: BP (!) 144/79 (BP  Location: Left Arm, Patient Position: Sitting, Cuff Size: Normal)   Pulse 79   Resp 16   Ht 5\' 3"  (1.6 m)   Wt 183 lb (83 kg)   BMI 32.42 kg/m   Physical Exam  Constitutional: She is oriented to person, place, and time. She appears well-developed and well-nourished.  Eyes: Pupils are equal, round, and reactive to light. EOM are normal.  Pulmonary/Chest: Effort normal.  Neurological: She is alert and oriented to person, place, and time.  Skin: Skin is warm and dry.  Psychiatric: She has a normal mood and affect. Her behavior is normal.    Ortho Exam awake alert and oriented x3 comfortable sitting.  Positive Phalen's and Tinel's over the left wrist.  Good grip and good release.  Good capillary refill to fingers.  Sensory exam intact.  Specialty Comments:  No specialty comments available.  Imaging: No results found.   PMFS History: Patient Active Problem List   Diagnosis Date Noted  . Carpal tunnel syndrome, left upper limb 11/06/2017  . Personal history of colonic adenoma 03/13/2003   Past Medical History:  Diagnosis Date  . Cancer ([place])    dermatosivros arcoma and protuverans  . Chronic back pain    degenerative disc disease  . Constipation    stool softener daily  . GERD (gastroesophageal reflux disease)    takes Prilosec daily  . GERD (gastroesophageal reflux disease)   . Heart murmur   . Hemorrhoids   . Insomnia  d/t chantix  . Nocturia   . Personal history of colonic adenoma 03/13/2003   03/13/2003 - 5 mm adenoma    Family History  Problem Relation Age of Onset  . Anesthesia problems Neg Hx   . Hypotension Neg Hx   . Malignant hyperthermia Neg Hx   . Pseudochol deficiency Neg Hx   . Colon cancer Neg Hx     Past Surgical History:  Procedure Laterality Date  . ABDOMINAL HYSTERECTOMY    . BACK SURGERY  1988  . BACK SURGERY  09-03-11   rods,screws x 8   . BUNIONECTOMY     bilateral  . BUNIONECTOMY     bil feet  . COLONOSCOPY    . PARTIAL  HYSTERECTOMY    . RECTOCELE REPAIR    . Rectum Repair    . RESECTION TUMOR WRIST RADICAL     x 2 rt  . TUBAL LIGATION  1974  . TUBAL LIGATION    . WRIST SURGERY     left   Social History   Occupational History  . Not on file  Tobacco Use  . Smoking status: Former Smoker    Packs/day: 1.00    Years: 50.00    Pack years: 50.00    Types: Cigarettes    Last attempt to quit: 08/17/2011    Years since quitting: 6.2  . Smokeless tobacco: Never Used  . Tobacco comment: 1 wk ago;taking chantix  Substance and Sexual Activity  . Alcohol use: No  . Drug use: No  . Sexual activity: Yes    Birth control/protection: Surgical, Other-see comments

## 2017-11-23 ENCOUNTER — Other Ambulatory Visit: Payer: Self-pay | Admitting: Internal Medicine

## 2017-11-23 DIAGNOSIS — E781 Pure hyperglyceridemia: Secondary | ICD-10-CM | POA: Diagnosis not present

## 2017-11-23 DIAGNOSIS — K760 Fatty (change of) liver, not elsewhere classified: Secondary | ICD-10-CM

## 2017-11-23 DIAGNOSIS — F411 Generalized anxiety disorder: Secondary | ICD-10-CM | POA: Diagnosis not present

## 2017-11-23 DIAGNOSIS — R0600 Dyspnea, unspecified: Secondary | ICD-10-CM | POA: Diagnosis not present

## 2017-11-23 DIAGNOSIS — L989 Disorder of the skin and subcutaneous tissue, unspecified: Secondary | ICD-10-CM | POA: Diagnosis not present

## 2017-11-23 DIAGNOSIS — I2510 Atherosclerotic heart disease of native coronary artery without angina pectoris: Secondary | ICD-10-CM | POA: Diagnosis not present

## 2017-11-23 DIAGNOSIS — D3500 Benign neoplasm of unspecified adrenal gland: Secondary | ICD-10-CM

## 2017-11-30 ENCOUNTER — Ambulatory Visit
Admission: RE | Admit: 2017-11-30 | Discharge: 2017-11-30 | Disposition: A | Discharge status: Home / Self Care | Payer: Medicare Other | Source: Ambulatory Visit | Attending: Internal Medicine | Admitting: Internal Medicine

## 2017-11-30 DIAGNOSIS — K760 Fatty (change of) liver, not elsewhere classified: Secondary | ICD-10-CM | POA: Diagnosis not present

## 2017-11-30 DIAGNOSIS — D3500 Benign neoplasm of unspecified adrenal gland: Secondary | ICD-10-CM

## 2017-11-30 DIAGNOSIS — E278 Other specified disorders of adrenal gland: Secondary | ICD-10-CM | POA: Diagnosis not present

## 2017-12-08 DIAGNOSIS — L570 Actinic keratosis: Secondary | ICD-10-CM | POA: Diagnosis not present

## 2017-12-08 DIAGNOSIS — R0600 Dyspnea, unspecified: Secondary | ICD-10-CM | POA: Diagnosis not present

## 2017-12-08 DIAGNOSIS — D225 Melanocytic nevi of trunk: Secondary | ICD-10-CM | POA: Diagnosis not present

## 2017-12-08 DIAGNOSIS — B078 Other viral warts: Secondary | ICD-10-CM | POA: Diagnosis not present

## 2017-12-08 DIAGNOSIS — X32XXXA Exposure to sunlight, initial encounter: Secondary | ICD-10-CM | POA: Diagnosis not present

## 2018-01-26 DIAGNOSIS — D3500 Benign neoplasm of unspecified adrenal gland: Secondary | ICD-10-CM | POA: Diagnosis not present

## 2018-01-26 DIAGNOSIS — J301 Allergic rhinitis due to pollen: Secondary | ICD-10-CM | POA: Diagnosis not present

## 2018-01-26 DIAGNOSIS — Z1159 Encounter for screening for other viral diseases: Secondary | ICD-10-CM | POA: Diagnosis not present

## 2018-01-26 DIAGNOSIS — K760 Fatty (change of) liver, not elsewhere classified: Secondary | ICD-10-CM | POA: Diagnosis not present

## 2018-01-26 DIAGNOSIS — R0609 Other forms of dyspnea: Secondary | ICD-10-CM | POA: Diagnosis not present

## 2018-05-04 DIAGNOSIS — L723 Sebaceous cyst: Secondary | ICD-10-CM | POA: Diagnosis not present

## 2018-05-04 DIAGNOSIS — E119 Type 2 diabetes mellitus without complications: Secondary | ICD-10-CM | POA: Diagnosis not present

## 2018-05-26 ENCOUNTER — Other Ambulatory Visit: Payer: Self-pay | Admitting: Internal Medicine

## 2018-05-26 ENCOUNTER — Ambulatory Visit
Admission: RE | Admit: 2018-05-26 | Discharge: 2018-05-26 | Disposition: A | Discharge status: Home / Self Care | Payer: Medicare Other | Source: Ambulatory Visit | Attending: Internal Medicine | Admitting: Internal Medicine

## 2018-05-26 DIAGNOSIS — Z1389 Encounter for screening for other disorder: Secondary | ICD-10-CM | POA: Diagnosis not present

## 2018-05-26 DIAGNOSIS — I2510 Atherosclerotic heart disease of native coronary artery without angina pectoris: Secondary | ICD-10-CM | POA: Diagnosis not present

## 2018-05-26 DIAGNOSIS — E279 Disorder of adrenal gland, unspecified: Secondary | ICD-10-CM | POA: Diagnosis not present

## 2018-05-26 DIAGNOSIS — E669 Obesity, unspecified: Secondary | ICD-10-CM | POA: Diagnosis not present

## 2018-05-26 DIAGNOSIS — M25469 Effusion, unspecified knee: Secondary | ICD-10-CM

## 2018-05-26 DIAGNOSIS — Z23 Encounter for immunization: Secondary | ICD-10-CM | POA: Diagnosis not present

## 2018-05-26 DIAGNOSIS — Z0000 Encounter for general adult medical examination without abnormal findings: Secondary | ICD-10-CM | POA: Diagnosis not present

## 2018-05-26 DIAGNOSIS — J301 Allergic rhinitis due to pollen: Secondary | ICD-10-CM | POA: Diagnosis not present

## 2018-05-26 DIAGNOSIS — E785 Hyperlipidemia, unspecified: Secondary | ICD-10-CM | POA: Diagnosis not present

## 2018-05-26 DIAGNOSIS — R7303 Prediabetes: Secondary | ICD-10-CM | POA: Diagnosis not present

## 2018-05-26 DIAGNOSIS — M25562 Pain in left knee: Secondary | ICD-10-CM | POA: Diagnosis not present

## 2018-06-29 DIAGNOSIS — Z1231 Encounter for screening mammogram for malignant neoplasm of breast: Secondary | ICD-10-CM | POA: Diagnosis not present

## 2018-10-06 ENCOUNTER — Other Ambulatory Visit: Payer: Self-pay | Admitting: Internal Medicine

## 2018-10-06 DIAGNOSIS — D3500 Benign neoplasm of unspecified adrenal gland: Secondary | ICD-10-CM

## 2018-10-06 DIAGNOSIS — K760 Fatty (change of) liver, not elsewhere classified: Secondary | ICD-10-CM

## 2018-10-08 ENCOUNTER — Ambulatory Visit
Admission: RE | Admit: 2018-10-08 | Discharge: 2018-10-08 | Disposition: A | Discharge status: Home / Self Care | Payer: Medicare Other | Source: Ambulatory Visit | Attending: Internal Medicine | Admitting: Internal Medicine

## 2018-10-08 ENCOUNTER — Other Ambulatory Visit: Payer: Self-pay

## 2018-10-08 DIAGNOSIS — K760 Fatty (change of) liver, not elsewhere classified: Secondary | ICD-10-CM

## 2018-10-08 DIAGNOSIS — D3500 Benign neoplasm of unspecified adrenal gland: Secondary | ICD-10-CM

## 2019-01-04 ENCOUNTER — Other Ambulatory Visit: Payer: Self-pay

## 2019-01-04 ENCOUNTER — Encounter: Payer: Self-pay | Admitting: Orthopaedic Surgery

## 2019-01-04 ENCOUNTER — Ambulatory Visit: Payer: Medicare Other | Admitting: Orthopaedic Surgery

## 2019-01-04 DIAGNOSIS — M7711 Lateral epicondylitis, right elbow: Secondary | ICD-10-CM | POA: Insufficient documentation

## 2019-01-04 MED ORDER — BUPIVACAINE HCL 0.5 % IJ SOLN
1.0000 mL | INTRAMUSCULAR | Status: AC | PRN
  Administered 2019-01-04: 1 mL

## 2019-01-04 MED ORDER — LIDOCAINE HCL 1 % IJ SOLN
1.0000 mL | INTRAMUSCULAR | Status: AC | PRN
  Administered 2019-01-04: 1 mL

## 2019-01-04 MED ORDER — METHYLPREDNISOLONE ACETATE 40 MG/ML IJ SUSP
40.0000 mg | INTRAMUSCULAR | Status: AC | PRN
  Administered 2019-01-04: 40 mg

## 2019-01-04 NOTE — Patient Instructions (Signed)
Tennis Elbow Rehab  Ask your health care provider which exercises are safe for you. Do exercises exactly as told by your health care provider and adjust them as directed. It is normal to feel mild stretching, pulling, tightness, or discomfort as you do these exercises, but you should stop right away if you feel sudden pain or your pain gets worse. Do not begin these exercises until told by your health care provider.  Stretching and range of motion exercises  These exercises warm up your muscles and joints and improve the movement and flexibility of your elbow. These exercises also help to relieve pain, numbness, and tingling.  Exercise A: Wrist extensor stretch  1. Extend your left / right elbow with your fingers pointing down.  2. Gently pull the palm of your left / right hand toward you until you feel a gentle stretch on the top of your forearm.  3. To increase the stretch, push your left / right hand toward the outer edge or pinkie side of your forearm.  4. Hold this position for __________ seconds.  Repeat __________ times. Complete this exercise __________ times a day.  If directed by your health care provider, repeat this stretch except do it with a bent elbow this time.  Exercise B: Wrist flexor stretch    1. Extend your left / right elbow and turn your palm upward.  2. Gently pull your left / right palm and fingertips back so your wrist extends and your fingers point more toward the ground.  3. You should feel a gentle stretch on the inside of your forearm.  4. Hold this position for __________ seconds.  Repeat __________ times. Complete this exercise __________ times a day.  If directed by your health care provider, repeat this stretch except do it with a bent elbow this time.  Strengthening exercises  These exercises build strength and endurance in your elbow. Endurance is the ability to use your muscles for a long time, even after they get tired.  Exercise C: Wrist extensors    1. Sit with your left /  right forearm palm-down and fully supported on a table or countertop. Your elbow should be resting below the height of your shoulder.  2. Let your left / right wrist extend over the edge of the surface.  3. Loosely hold a __________ weight or a piece of rubber exercise band or tubing in your left / right hand. Slowly curl your left / right hand up toward your forearm. If you are using band or tubing, hold the band or tubing in place with your other hand to provide resistance.  4. Hold this position for __________ seconds.  5. Slowly return to the starting position.  Repeat __________ times. Complete this exercise __________ times a day.  Exercise D: Radial deviators    1. Stand with a __________ weight in your left / righthand. Or, sit while holding a rubber exercise band or tubing with your other arm supported on a table or countertop. Position your hand so your thumb is on top.  2. Raise your hand upward in front of you so your thumb travels toward your forearm, or pull up on the rubber tubing.  3. Hold this position for __________ seconds.  4. Slowly return to the starting position.  Repeat __________ times. Complete this exercise __________ times a day.  Exercise E: Eccentric wrist extensors  1. Sit with your left / right forearm palm-down and fully supported on a table or countertop. Your   elbow should be resting below the height of your shoulder.  2. If told by your health care provider, hold a __________ weight in your hand.  3. Let your left / right wrist extend over the edge of the surface.  4. Use your other hand to lift up your left / right hand toward your forearm. Keep your forearm on the table.  5. Using only the muscles in your left / right hand, slowly lower your hand back down to the starting position.  Repeat __________ times. Complete this exercise __________ times a day.  This information is not intended to replace advice given to you by your health care provider. Make sure you discuss any  questions you have with your health care provider.  Document Released: 06/30/2005 Document Revised: 03/05/2016 Document Reviewed: 03/29/2015  Elsevier Interactive Patient Education  2019 [employer]

## 2019-01-04 NOTE — Progress Notes (Addendum)
Office Visit Note   Patient: Janice Pace           Date of Birth: May 05, 1946           MRN: [id number] Visit Date: 01/04/2019              Requested by: Leeroy Cha, MD [address] [place],  [place] [address] PCP: Leeroy Cha, MD   Assessment & Plan: Visit Diagnoses:  1. Right tennis elbow     Plan: Lateral epicondylitis right elbow.  Long discussion regarding diagnosis and treatment options.  Janice Pace is tried over-the-counter medicines ice and limited activities without much relief.  Would like a cortisone injection.  This was performed without difficulty.  We will also apply tennis elbow splint and give her a set of exercises and return as needed.  Office visit over 30 minutes regarding all of the above depression of the time in counseling  Follow-Up Instructions: Return if symptoms worsen or fail to improve.   Orders:  No orders of the defined types were placed in this encounter.  No orders of the defined types were placed in this encounter.     Procedures: Hand/UE Inj: R elbow for lateral epicondylitis on 01/04/2019 11:53 AM Medications: 1 mL lidocaine 1 %; 1 mL bupivacaine 0.5 %; 40 mg methylPREDNISolone acetate 40 MG/ML      Clinical Data: No additional findings.   Subjective: Chief Complaint  Patient presents with  . Right Elbow - Pain  Patient presents today with right elbow pain X3 weeks. She is right hand dominant. No known injury. Her pain is located laterally and radiates now all throughout her arm. She has applied Voltaren gel and taken Naproxen, but nothing seems to help.  Has been very active around her home and picking up her grandchildren with insidious onset of pain.  No numbness or tingling.  Pain is localized along the lateral elbow  HPI  Review of Systems   Objective: Vital Signs: BP (!) 161/84   Pulse 85   Ht 5\' 3"  (1.6 m)   Wt 180 lb (81.6 kg)   BMI 31.89 kg/m   Physical Exam Constitutional:       Appearance: She is well-developed.  Eyes:     Pupils: Pupils are equal, round, and reactive to light.  Pulmonary:     Effort: Pulmonary effort is normal.  Skin:    General: Skin is warm and dry.  Neurological:     Mental Status: She is alert and oriented to person, place, and time.  Psychiatric:        Behavior: Behavior normal.     Ortho Exam right elbow with tenderness directly over the lateral epicondyles.  No swelling.  No skin changes.  Pain with grip mostly in extension.  Neurologically intact  Specialty Comments:  No specialty comments available.  Imaging: No results found.   PMFS History: Patient Active Problem List   Diagnosis Date Noted  . Right tennis elbow 01/04/2019  . Carpal tunnel syndrome, left upper limb 11/06/2017  . Personal history of colonic adenoma 03/13/2003   Past Medical History:  Diagnosis Date  . Cancer ([place])    dermatosivros arcoma and protuverans  . Chronic back pain    degenerative disc disease  . Constipation    stool softener daily  . GERD (gastroesophageal reflux disease)    takes Prilosec daily  . GERD (gastroesophageal reflux disease)   . Heart murmur   . Hemorrhoids   .  Insomnia    d/t chantix  . Nocturia   . Personal history of colonic adenoma 03/13/2003   03/13/2003 - 5 mm adenoma    Family History  Problem Relation Age of Onset  . Anesthesia problems Neg Hx   . Hypotension Neg Hx   . Malignant hyperthermia Neg Hx   . Pseudochol deficiency Neg Hx   . Colon cancer Neg Hx     Past Surgical History:  Procedure Laterality Date  . ABDOMINAL HYSTERECTOMY    . BACK SURGERY  1988  . BACK SURGERY  09-03-11   rods,screws x 8   . BUNIONECTOMY     bilateral  . BUNIONECTOMY     bil feet  . COLONOSCOPY    . PARTIAL HYSTERECTOMY    . RECTOCELE REPAIR    . Rectum Repair    . RESECTION TUMOR WRIST RADICAL     x 2 rt  . TUBAL LIGATION  1974  . TUBAL LIGATION    . WRIST SURGERY     left   Social History   Occupational  History  . Not on file  Tobacco Use  . Smoking status: Former Smoker    Packs/day: 1.00    Years: 50.00    Pack years: 50.00    Types: Cigarettes    Quit date: 08/17/2011    Years since quitting: 7.3  . Smokeless tobacco: Never Used  . Tobacco comment: 1 wk ago;taking chantix  Substance and Sexual Activity  . Alcohol use: No  . Drug use: No  . Sexual activity: Yes    Birth control/protection: Surgical, Other-see comments

## 2019-05-23 ENCOUNTER — Ambulatory Visit
Admission: RE | Admit: 2019-05-23 | Discharge: 2019-05-23 | Disposition: A | Discharge status: Home / Self Care | Payer: Medicare Other | Source: Ambulatory Visit | Attending: Internal Medicine | Admitting: Internal Medicine

## 2019-05-23 ENCOUNTER — Other Ambulatory Visit: Payer: Self-pay | Admitting: Internal Medicine

## 2019-05-23 DIAGNOSIS — M47816 Spondylosis without myelopathy or radiculopathy, lumbar region: Secondary | ICD-10-CM

## 2019-06-08 LAB — BASIC METABOLIC PANEL
BUN: 14 (ref 4–21)
CO2: 27 — AB (ref 13–22)
Chloride: 104 (ref 99–108)
Creatinine: 0.7 (ref 0.5–1.1)
Glucose: 111
Potassium: 4.4 (ref 3.4–5.3)
Sodium: 140 (ref 137–147)

## 2019-06-08 LAB — HEMOGLOBIN A1C: Hemoglobin A1C: 5.7

## 2019-06-08 LAB — CBC AND DIFFERENTIAL
HCT: 39 (ref 36–46)
Hemoglobin: 13.6 (ref 12.0–16.0)
Platelets: 229 (ref 150–399)
WBC: 6.1

## 2019-06-08 LAB — LIPID PANEL
Cholesterol: 173 (ref 0–200)
Triglycerides: 108 (ref 40–160)

## 2019-06-08 LAB — COMPREHENSIVE METABOLIC PANEL
Albumin: 4.3 (ref 3.5–5.0)
Calcium: 9.7 (ref 8.7–10.7)
GFR calc Af Amer: 93
GFR calc non Af Amer: 77

## 2019-06-08 LAB — HEPATIC FUNCTION PANEL
ALT: 15 (ref 7–35)
AST: 13 (ref 13–35)
Bilirubin, Total: 0.7

## 2019-06-08 LAB — CBC: RBC: 4.39 (ref 3.87–5.11)

## 2019-08-02 ENCOUNTER — Other Ambulatory Visit: Payer: Medicare Other

## 2019-08-04 ENCOUNTER — Ambulatory Visit: Payer: Medicare Other | Attending: Internal Medicine

## 2019-08-04 DIAGNOSIS — Z23 Encounter for immunization: Secondary | ICD-10-CM

## 2019-08-04 NOTE — Progress Notes (Signed)
   Covid-19 Vaccination Clinic  Name:  Janice Pace    MRN: [id number] DOB: Feb 10, 1946  08/04/2019  [name] was observed post Covid-19 immunization for 15 minutes without incidence. She was provided with Vaccine Information Sheet and instruction to access the V-Safe system.   [name] was instructed to call 911 with any severe reactions post vaccine: Marland Kitchen Difficulty breathing  . Swelling of your face and throat  . A fast heartbeat  . A bad rash all over your body  . Dizziness and weakness    Immunizations Administered    Name Date Dose VIS Date Route   Pfizer COVID-19 Vaccine 08/04/2019 10:06 AM 0.3 mL 06/24/2019 Intramuscular   Manufacturer: [place]   Lot: [id number]   [place]: [id number]

## 2019-08-11 ENCOUNTER — Ambulatory Visit (INDEPENDENT_AMBULATORY_CARE_PROVIDER_SITE_OTHER): Payer: Medicare Other | Admitting: Family Medicine

## 2019-08-11 ENCOUNTER — Encounter (INDEPENDENT_AMBULATORY_CARE_PROVIDER_SITE_OTHER): Payer: Self-pay | Admitting: Family Medicine

## 2019-08-11 ENCOUNTER — Other Ambulatory Visit: Payer: Self-pay

## 2019-08-11 VITALS — BP 146/83 | HR 74 | Temp 98.2°F | Ht 62.0 in | Wt 191.0 lb

## 2019-08-11 DIAGNOSIS — E119 Type 2 diabetes mellitus without complications: Secondary | ICD-10-CM

## 2019-08-11 DIAGNOSIS — M549 Dorsalgia, unspecified: Secondary | ICD-10-CM

## 2019-08-11 DIAGNOSIS — K219 Gastro-esophageal reflux disease without esophagitis: Secondary | ICD-10-CM

## 2019-08-11 DIAGNOSIS — R5383 Other fatigue: Secondary | ICD-10-CM

## 2019-08-11 DIAGNOSIS — E7849 Other hyperlipidemia: Secondary | ICD-10-CM

## 2019-08-11 DIAGNOSIS — Z6834 Body mass index (BMI) 34.0-34.9, adult: Secondary | ICD-10-CM | POA: Diagnosis not present

## 2019-08-11 DIAGNOSIS — R0602 Shortness of breath: Secondary | ICD-10-CM | POA: Diagnosis not present

## 2019-08-11 DIAGNOSIS — E669 Obesity, unspecified: Secondary | ICD-10-CM | POA: Diagnosis not present

## 2019-08-11 DIAGNOSIS — F3289 Other specified depressive episodes: Secondary | ICD-10-CM | POA: Diagnosis not present

## 2019-08-11 DIAGNOSIS — Z0289 Encounter for other administrative examinations: Secondary | ICD-10-CM

## 2019-08-11 NOTE — Progress Notes (Signed)
Chief Complaint:   Janice Pace (MR# [id number]) is a 74 y.o. female who presents for evaluation and treatment of obesity and related comorbidities. Current BMI is Body mass index is 34.93 kg/m. Janice Pace has been struggling with her weight for many years and has been unsuccessful in either losing weight, maintaining weight loss, or reaching her healthy weight goal.  Janice Pace is currently in the action stage of change and ready to dedicate time achieving and maintaining a healthier weight. Janice Pace is interested in becoming our patient and working on intensive lifestyle modifications including (but not limited to) diet and exercise for weight loss.  Janice Pace has a sister-in-law in the program.  They would like to be able to work together.  Janice Pace's habits were reviewed today and are as follows: Her family eats meals together, she thinks her family will eat healthier with her, her desired weight loss is 43-53 pounds, she started gaining weight when she reached middle age and stopped smoking, her heaviest weight ever was 193 pounds, she snacks frequently in the evenings, she skips lunch 3 or 4 days a week and she is frequently drinking liquids with calories.  Depression Screen Janice Pace's Food and Mood (modified PHQ-9) score was 8.  Depression screen PHQ 2/9 08/11/2019  Decreased Interest 1  Down, Depressed, Hopeless 1  PHQ - 2 Score 2  Altered sleeping 0  Tired, decreased energy 3  Change in appetite 1  Feeling bad or failure about yourself  0  Trouble concentrating 0  Moving slowly or fidgety/restless 2  Suicidal thoughts 0  PHQ-9 Score 8  Difficult doing work/chores Not difficult at all   Subjective:   1. Other fatigue Janice Pace denies daytime somnolence and denies waking up still tired. Patent has a history of symptoms of snoring. Janice Pace generally gets 8 hours of sleep per night, and states that she has generally restful sleep. Snoring is present. Apneic episodes are not present. Epworth  Sleepiness Score is 7.  2. Shortness of breath on exertion Janice Pace notes increasing shortness of breath with exercising and seems to be worsening over time with weight gain. She notes getting out of breath sooner with activity than she used to. This has gotten worse recently. Janice Pace denies shortness of breath at rest or orthopnea.  3. Other hyperlipidemia Janice Pace has hyperlipidemia and has been trying to improve her cholesterol levels with intensive lifestyle modification including a low saturated fat diet, exercise and weight loss. She denies any chest pain, claudication or myalgias.  She is taking Crestor.  Per patient, she has history of "blockage in aorta" and history of treadmill test that was negative.  4. Gastroesophageal reflux disease, unspecified whether esophagitis present She has been experiencing acid reflux.  She is taking omeprazole.  5. Back pain, unspecified back location, unspecified back pain laterality, unspecified chronicity Janice Pace suffers from back pain.  6. Type 2 diabetes mellitus without complication, without long-term current use of insulin (HCC) Medications reviewed.  A1c is 7.8.   Lab Results  Component Value Date   CREATININE 0.57 09/02/2011   7. Other depression, with emotional eating Janice Pace is struggling with emotional eating and using food for comfort to the extent that it is negatively impacting her health. She has been working on behavior modification techniques to help reduce her emotional eating and has been unsuccessful. She shows no sign of suicidal or homicidal ideations.  Her PHQ-9 today is 8.  She reports that she wakes up at night due to pain at  times, and will eat when she does this.  Assessment/Plan:   1. Other fatigue Janice Pace does not feel that her weight is causing her energy to be lower than it should be. Fatigue may be related to obesity, depression or many other causes. Labs will be ordered, and in the meanwhile, Janice Pace will focus on self care  including making healthy food choices, increasing physical activity and focusing on stress reduction.  Will consider sleep study in the future.  Orders - EKG 12-Lead  2. Shortness of breath on exertion Janice Pace does feel that she gets out of breath more easily that she used to when she exercises. Janice Pace's shortness of breath appears to be obesity related and exercise induced. She has agreed to work on weight loss and gradually increase exercise to treat her exercise induced shortness of breath. Will continue to monitor closely.  3. Other hyperlipidemia Cardiovascular risk and specific lipid/LDL goals reviewed.  We discussed several lifestyle modifications today and Janice Pace will continue to work on diet, exercise and weight loss efforts. Orders and follow up as documented in patient record.   Counseling Intensive lifestyle modifications are the first line treatment for this issue. . Dietary changes: Increase soluble fiber. Decrease simple carbohydrates. . Exercise changes: Moderate to vigorous-intensity aerobic activity 150 minutes per week if tolerated. . Lipid-lowering medications: see documented in medical record.  4. Gastroesophageal reflux disease, unspecified whether esophagitis present Intensive lifestyle modifications are the first line treatment for this issue. We discussed several lifestyle modifications today and she will continue to work on diet, exercise and weight loss efforts. Orders and follow up as documented in patient record.   Counseling . If a person has gastroesophageal reflux disease (GERD), food and stomach acid move back up into the esophagus and cause symptoms or problems such as damage to the esophagus. . Anti-reflux measures include: raising the head of the bed, avoiding tight clothing or belts, avoiding eating late at night, not lying down shortly after mealtime, and achieving weight loss. . Avoid ASA, NSAID's, caffeine, alcohol, and tobacco.  . OTC Pepcid and/or Tums  are often very helpful for as needed use.  Marland Kitchen However, for persisting chronic or daily symptoms, stronger medications like Omeprazole may be needed. . You may need to avoid foods and drinks such as: ? Coffee and tea (with or without caffeine). ? Drinks that contain alcohol. ? Energy drinks and sports drinks. ? Bubbly (carbonated) drinks or sodas. ? Chocolate and cocoa. ? Peppermint and mint flavorings. ? Garlic and onions. ? Horseradish. ? Spicy and acidic foods. These include peppers, chili powder, curry powder, vinegar, hot sauces, and BBQ sauce. ? Citrus fruit juices and citrus fruits, such as oranges, lemons, and limes. ? Tomato-based foods. These include red sauce, chili, salsa, and pizza with red sauce. ? Fried and fatty foods. These include donuts, french fries, potato chips, and high-fat dressings. ? High-fat meats. These include hot dogs, rib eye steak, sausage, ham, and bacon.  5. Back pain, unspecified back location, unspecified back pain laterality, unspecified chronicity Will follow because mobility and pain control are important for weight management.  6. Type 2 diabetes mellitus without complication, without long-term current use of insulin (HCC) Good blood sugar control is important to decrease the likelihood of diabetic complications such as nephropathy, neuropathy, limb loss, blindness, coronary artery disease, and death. Intensive lifestyle modification including diet, exercise and weight loss are the first line of treatment for diabetes.   7. Other depression, with emotional eating Behavior modification techniques  were discussed today to help Janice Pace deal with her emotional/non-hunger eating behaviors.  Orders and follow up as documented in patient record.   8. Class 1 obesity with serious comorbidity and body mass index (BMI) of 34.0 to 34.9 in adult, unspecified obesity type Janice Pace is currently in the action stage of change and her goal is to continue with weight loss  efforts. I recommend Janice Pace begin the structured treatment plan as follows:  She has agreed to the Category 1 Plan.  Exercise goals: 1-3 hours of water aerobics 2-3 days a week.  Behavioral modification strategies: increasing lean protein intake, decreasing simple carbohydrates, increasing vegetables, increasing water intake and decreasing liquid calories.  She was informed of the importance of frequent follow-up visits to maximize her success with intensive lifestyle modifications for her multiple health conditions. She was informed we would discuss her lab results at her next visit unless there is a critical issue that needs to be addressed sooner. Janice Pace agreed to keep her next visit at the agreed upon time to discuss these results.  Objective:   Blood pressure (!) 146/83, pulse 74, temperature 98.2 F (36.8 C), temperature source Oral, height 5\' 2"  (1.575 m), weight 191 lb (86.6 kg), SpO2 98 %. Body mass index is 34.93 kg/m.  EKG: Normal sinus rhythm, rate 71 bpm.  Indirect Calorimeter completed today shows a VO2 of 192 and a REE of 1337.  Her calculated basal metabolic rate is [id number] thus her basal metabolic rate is worse than expected.  General: Cooperative, alert, well developed, in no acute distress. HEENT: Conjunctivae and lids unremarkable. Cardiovascular: Regular rhythm.  Lungs: Normal work of breathing. Neurologic: No focal deficits.   Lab Results  Component Value Date   CREATININE 0.57 09/02/2011   BUN 15 09/02/2011   NA 137 09/02/2011   K 3.9 09/02/2011   CL 106 09/02/2011   CO2 22 09/02/2011   Lab Results  Component Value Date   WBC 5.7 09/02/2011   HGB 12.6 09/02/2011   HCT 36.6 09/02/2011   MCV 87.8 09/02/2011   PLT 223 09/02/2011   No results found for: IRON, TIBC, FERRITIN Obesity Behavioral Intervention Visit Documentation for Insurance:   Approximately 15 minutes were spent on the discussion below.  ASK: We discussed the diagnosis of obesity with  Janice Pace today and Talesia agreed to give [place] permission to discuss obesity behavioral modification therapy today.  ASSESS: Ranya has the diagnosis of obesity and her BMI today is 34.9. Kelbie is in the action stage of change.   ADVISE: Amadi was educated on the multiple health risks of obesity as well as the benefit of weight loss to improve her health. She was advised of the need for long term treatment and the importance of lifestyle modifications to improve her current health and to decrease her risk of future health problems.  AGREE: Multiple dietary modification options and treatment options were discussed and Nori agreed to follow the recommendations documented in the above note.  ARRANGE: Caral was educated on the importance of frequent visits to treat obesity as outlined per CMS and USPSTF guidelines and agreed to schedule her next follow up appointment today.  Attestation Statements:   This is the patient's first visit at [facility]. The patient's NEW PATIENT PACKET was reviewed at length. Included in the packet: current and past health history, medications, allergies, ROS, gynecologic history (women only), surgical history, family history, social history, weight history, weight loss surgery history (for those that have had weight  loss surgery), nutritional evaluation, mood and food questionnaire, PHQ9, Epworth questionnaire, sleep habits questionnaire, patient life and health improvement goals questionnaire. These will all be scanned into the patient's chart under media.   During the visit, I independently reviewed the patient's EKG, bioimpedance scale results, and indirect calorimeter results. I used this information to tailor a meal plan for the patient that will help her to lose weight and will improve her obesity-related conditions going forward. I performed a medically necessary appropriate examination and/or evaluation. I discussed the assessment and treatment plan with  the patient. The patient was provided an opportunity to ask questions and all were answered. The patient agreed with the plan and demonstrated an understanding of the instructions. Labs were ordered at this visit and will be reviewed at the next visit unless more critical results need to be addressed immediately. Clinical information was updated and documented in the EMR.   Time spent on visit including pre-visit chart review and post-visit care was 60 minutes.   A separate 15 minutes was spent on risk counseling (see above).   I, Water quality scientist, CMA, am acting as Location manager for [employer], DO.  I have reviewed the above documentation for accuracy and completeness, and I agree with the above. -Briscoe Deutscher, DO

## 2019-08-17 ENCOUNTER — Encounter (INDEPENDENT_AMBULATORY_CARE_PROVIDER_SITE_OTHER): Payer: Self-pay

## 2019-08-18 ENCOUNTER — Ambulatory Visit: Payer: Medicare Other | Admitting: Orthopaedic Surgery

## 2019-08-18 ENCOUNTER — Other Ambulatory Visit: Payer: Self-pay

## 2019-08-18 ENCOUNTER — Ambulatory Visit: Payer: Self-pay

## 2019-08-18 ENCOUNTER — Encounter: Payer: Self-pay | Admitting: Orthopaedic Surgery

## 2019-08-18 VITALS — Ht 62.0 in | Wt 191.0 lb

## 2019-08-18 DIAGNOSIS — M7711 Lateral epicondylitis, right elbow: Secondary | ICD-10-CM

## 2019-08-18 DIAGNOSIS — M25562 Pain in left knee: Secondary | ICD-10-CM | POA: Diagnosis not present

## 2019-08-18 DIAGNOSIS — G8929 Other chronic pain: Secondary | ICD-10-CM

## 2019-08-18 DIAGNOSIS — M1712 Unilateral primary osteoarthritis, left knee: Secondary | ICD-10-CM | POA: Diagnosis not present

## 2019-08-18 MED ORDER — METHYLPREDNISOLONE ACETATE 40 MG/ML IJ SUSP
20.0000 mg | INTRAMUSCULAR | Status: AC | PRN
  Administered 2019-08-18: 20 mg

## 2019-08-18 MED ORDER — LIDOCAINE HCL 1 % IJ SOLN
1.0000 mL | INTRAMUSCULAR | Status: AC | PRN
  Administered 2019-08-18: 1 mL

## 2019-08-18 NOTE — Progress Notes (Signed)
Office Visit Note   Patient: Janice Pace           Date of Birth: [birth year]           MRN: [id number] Visit Date: 08/18/2019              Requested by: Leeroy Cha, MD [address] [place],  [place] [address] PCP: Leeroy Cha, MD   Assessment & Plan: Visit Diagnoses:  1. Chronic pain of left knee   2. Right tennis elbow   3. Unilateral primary osteoarthritis, left knee     Plan: Mrs. Clegg has recurrent right tennis elbow.  I discussed surgical treatment versus repeat cortisone injection.  She prefer to have the cortisone.  This was performed without difficulty with immediate relief of her pain.  In addition she has some osteoarthritis predominantly of the medial compartment of the left knee.  Presently minimally symptomatic.  We talked about use of anti-inflammatory medicines exercises and potential cortisone injection over time  Follow-Up Instructions: Return if symptoms worsen or fail to improve.   Orders:  Orders Placed This Encounter  Procedures  . Hand/UE Inj: R elbow  . XR KNEE 3 VIEW LEFT   No orders of the defined types were placed in this encounter.     Procedures: Hand/UE Inj: R elbow for lateral epicondylitis on 08/18/2019 9:27 AM Details: 27 G needle, lateral approach Medications: 1 mL lidocaine 1 %; 20 mg methylPREDNISolone acetate 40 MG/ML      Clinical Data: No additional findings.   Subjective: Chief Complaint  Patient presents with  . Right Elbow - Pain Left Knee pain  Patient presents today for pain in her right elbow. She was last seen in June of 2020 for tennis elbow. She was given a cortisone injection. She states that the injection was helpful and lasted until Christmas. She said that she irritated it while doing a lot of stirring and has ached since. The pain is constant, but worse with use. She has tried the elbow strap but states that it does not help. She takes Ibuprofen as needed. She also wanted  to be evaluated for her left knee pain today as well. She said that her left knee has hurt for years. No known injury. The pain is located medially and worse with walking. Her knee will swell and seems to swell more with riding a motorcycle. No popping, clicking, grinding, or giving way. No previous surgery to her knee.   HPI  Review of Systems   Objective: Vital Signs: Ht 5\' 2"  (1.575 m)   Wt 191 lb (86.6 kg)   BMI 34.93 kg/m   Physical Exam Constitutional:      Appearance: She is well-developed.  Eyes:     Pupils: Pupils are equal, round, and reactive to light.  Pulmonary:     Effort: Pulmonary effort is normal.  Skin:    General: Skin is warm and dry.  Neurological:     Mental Status: She is alert and oriented to person, place, and time.  Psychiatric:        Behavior: Behavior normal.     Ortho Exam left elbow with tenderness directly over the lateral epicondyle.  Pain with grip mostly in extension.  Full range of motion.  Skin intact.  Neurologically intact.  Left knee was not hot red warm or swollen.  No effusion.  Predominant medial joint pain.  No patella crepitation or pain beneath the patella or even in the  lateral compartment.  Full extension and flexion over 100 degrees without instability  Specialty Comments:  No specialty comments available.  Imaging: XR KNEE 3 VIEW LEFT  Result Date: 08/18/2019 Films of the left knee were obtained in 3 projections standing.  There is some narrowing of the medial joint space associated with peripheral osteophytes and mild subchondral sclerosis.  No osteophytes in the lateral compartment.  Patellofemoral joint appear to be relatively clean with just minimal peripheral osteophytes.  No acute changes.  No ectopic calcification.  Films are consistent with moderate osteoarthritis    PMFS History: Patient Active Problem List   Diagnosis Date Noted  . Unilateral primary osteoarthritis, left knee 08/18/2019  . Right tennis elbow  01/04/2019  . Carpal tunnel syndrome, left upper limb 11/06/2017  . Personal history of colonic adenoma 03/13/2003   Past Medical History:  Diagnosis Date  . Cancer ([place])    dermatosivros arcoma and protuverans  . Chronic back pain    degenerative disc disease  . Constipation    stool softener daily  . Constipation   . GERD (gastroesophageal reflux disease)    takes Prilosec daily  . GERD (gastroesophageal reflux disease)   . Heart murmur   . Heart valve disorder   . Hemorrhoids   . Insomnia    d/t chantix  . Joint pain   . Nocturia   . Personal history of colonic adenoma 03/13/2003   03/13/2003 - 5 mm adenoma  . Prediabetes     Family History  Problem Relation Age of Onset  . Cancer Mother   . Anesthesia problems Neg Hx   . Hypotension Neg Hx   . Malignant hyperthermia Neg Hx   . Pseudochol deficiency Neg Hx   . Colon cancer Neg Hx     Past Surgical History:  Procedure Laterality Date  . ABDOMINAL HYSTERECTOMY    . BACK SURGERY  1988  . BACK SURGERY  09-03-11   rods,screws x 8   . BUNIONECTOMY     bilateral  . BUNIONECTOMY     bil feet  . COLONOSCOPY    . dermatosibrosarcoma protuberans    . PARTIAL HYSTERECTOMY    . RECTOCELE REPAIR    . Rectum Repair    . RESECTION TUMOR WRIST RADICAL     x 2 rt  . TUBAL LIGATION  1974  . TUBAL LIGATION    . WRIST SURGERY     left   Social History   Occupational History  . Occupation: retired  Tobacco Use  . Smoking status: Former Smoker    Packs/day: 1.00    Years: 50.00    Pack years: 50.00    Types: Cigarettes    Quit date: 08/17/2011    Years since quitting: 8.0  . Smokeless tobacco: Never Used  . Tobacco comment: 1 wk ago;taking chantix  Substance and Sexual Activity  . Alcohol use: No  . Drug use: No  . Sexual activity: Yes    Birth control/protection: Surgical, Other-see comments

## 2019-08-24 ENCOUNTER — Ambulatory Visit: Payer: Medicare Other | Attending: Internal Medicine

## 2019-08-24 DIAGNOSIS — Z23 Encounter for immunization: Secondary | ICD-10-CM | POA: Insufficient documentation

## 2019-08-24 NOTE — Progress Notes (Signed)
   Covid-19 Vaccination Clinic  Name:  Janice Pace    MRN: [id number] DOB: May 06, 1946  08/24/2019  [name] was observed post Covid-19 immunization for 15 minutes without incidence. She was provided with Vaccine Information Sheet and instruction to access the V-Safe system.   [name] was instructed to call 911 with any severe reactions post vaccine: Marland Kitchen Difficulty breathing  . Swelling of your face and throat  . A fast heartbeat  . A bad rash all over your body  . Dizziness and weakness    Immunizations Administered    Name Date Dose VIS Date Route   Pfizer COVID-19 Vaccine 08/24/2019  3:16 PM 0.3 mL 06/24/2019 Intramuscular   Manufacturer: [place]   Lot: [id number]   [place]: [id number]

## 2019-08-25 ENCOUNTER — Ambulatory Visit (INDEPENDENT_AMBULATORY_CARE_PROVIDER_SITE_OTHER): Payer: Medicare Other | Admitting: Family Medicine

## 2019-08-25 ENCOUNTER — Encounter (INDEPENDENT_AMBULATORY_CARE_PROVIDER_SITE_OTHER): Payer: Self-pay | Admitting: Family Medicine

## 2019-08-25 ENCOUNTER — Other Ambulatory Visit: Payer: Self-pay

## 2019-08-25 VITALS — BP 131/57 | HR 84 | Temp 98.7°F | Ht 62.0 in | Wt 187.0 lb

## 2019-08-25 DIAGNOSIS — R1013 Epigastric pain: Secondary | ICD-10-CM | POA: Diagnosis not present

## 2019-08-25 DIAGNOSIS — E669 Obesity, unspecified: Secondary | ICD-10-CM

## 2019-08-25 DIAGNOSIS — E1169 Type 2 diabetes mellitus with other specified complication: Secondary | ICD-10-CM

## 2019-08-25 DIAGNOSIS — E785 Hyperlipidemia, unspecified: Secondary | ICD-10-CM

## 2019-08-25 DIAGNOSIS — E119 Type 2 diabetes mellitus without complications: Secondary | ICD-10-CM | POA: Diagnosis not present

## 2019-08-25 DIAGNOSIS — Z6834 Body mass index (BMI) 34.0-34.9, adult: Secondary | ICD-10-CM | POA: Diagnosis not present

## 2019-08-25 DIAGNOSIS — M549 Dorsalgia, unspecified: Secondary | ICD-10-CM | POA: Diagnosis not present

## 2019-08-25 DIAGNOSIS — G8929 Other chronic pain: Secondary | ICD-10-CM

## 2019-08-25 NOTE — Progress Notes (Signed)
Chief Complaint:   OBESITY Joette is here to discuss her progress with her obesity treatment plan along with follow-up of her obesity related diagnoses. Dixie is on the Category 1 Plan and states she is following her eating plan approximately 90% of the time. Nataja states she is doing water aerobics for 2.5 hours 3 times per week.  Today's visit was #: 2 Starting weight: 191 lbs Starting date: 08/11/2019 Today's weight: 187 lbs Today's date: 08/25/2019 Total lbs lost to date: 4 lbs Total lbs lost since last in-office visit: 4 lbs  Interim History: Jovona did well.  She wants to change the dinner option slightly.  We discussed protein and calorie goals.  Subjective:   1. Type 2 diabetes mellitus without complication, without long-term current use of insulin (HCC) She is not currently on any medications.  Lab Results  Component Value Date   HGBA1C 5.7 06/08/2019   Lab Results  Component Value Date   CREATININE 0.7 06/08/2019   2. Chronic back pain, unspecified back location, unspecified back pain laterality Anisa has chronic back pain.  3. Dyspepsia She has been experiencing dyspepsia.   4. Hyperlipidemia associated with type 2 diabetes mellitus ([place]) Shalaina has hyperlipidemia and has been trying to improve her cholesterol levels with intensive lifestyle modification including a low saturated fat diet, exercise and weight loss. She denies any chest pain, claudication or myalgias.  She is taking Crestor.  Lab Results  Component Value Date   ALT 15 06/08/2019   AST 13 06/08/2019   Lab Results  Component Value Date   CHOL 173 06/08/2019   TRIG 108 06/08/2019   Assessment/Plan:   1. Type 2 diabetes mellitus without complication, without long-term current use of insulin (HCC) Good blood sugar control is important to decrease the likelihood of diabetic complications such as nephropathy, neuropathy, limb loss, blindness, coronary artery disease, and death. Intensive  lifestyle modification including diet, exercise and weight loss are the first line of treatment for diabetes.   2. Chronic back pain, unspecified back location, unspecified back pain laterality Will follow because mobility and pain control are important for weight management.  3. Dyspepsia Intensive lifestyle modifications are the first line treatment for this issue. We discussed several lifestyle modifications today and she will continue to work on diet, exercise and weight loss efforts. Orders and follow up as documented in patient record.   Counseling . If a person has gastroesophageal reflux disease (GERD), food and stomach acid move back up into the esophagus and cause symptoms or problems such as damage to the esophagus. . Anti-reflux measures include: raising the head of the bed, avoiding tight clothing or belts, avoiding eating late at night, not lying down shortly after mealtime, and achieving weight loss. . Avoid ASA, NSAID's, caffeine, alcohol, and tobacco.  . OTC Pepcid and/or Tums are often very helpful for as needed use.  Marland Kitchen However, for persisting chronic or daily symptoms, stronger medications like Omeprazole may be needed. . You may need to avoid foods and drinks such as: ? Coffee and tea (with or without caffeine). ? Drinks that contain alcohol. ? Energy drinks and sports drinks. ? Bubbly (carbonated) drinks or sodas. ? Chocolate and cocoa. ? Peppermint and mint flavorings. ? Garlic and onions. ? Horseradish. ? Spicy and acidic foods. These include peppers, chili powder, curry powder, vinegar, hot sauces, and BBQ sauce. ? Citrus fruit juices and citrus fruits, such as oranges, lemons, and limes. ? Tomato-based foods. These include red sauce,  chili, salsa, and pizza with red sauce. ? Fried and fatty foods. These include donuts, french fries, potato chips, and high-fat dressings. ? High-fat meats. These include hot dogs, rib eye steak, sausage, ham, and bacon.  4.  Hyperlipidemia associated with type 2 diabetes mellitus ([place]) Cardiovascular risk and specific lipid/LDL goals reviewed.  We discussed several lifestyle modifications today and Ladavia will continue to work on diet, exercise and weight loss efforts. Orders and follow up as documented in patient record.   Counseling Intensive lifestyle modifications are the first line treatment for this issue. . Dietary changes: Increase soluble fiber. Decrease simple carbohydrates. . Exercise changes: Moderate to vigorous-intensity aerobic activity 150 minutes per week if tolerated. . Lipid-lowering medications: see documented in medical record.  5. Class 1 obesity with serious comorbidity and body mass index (BMI) of 34.0 to 34.9 in adult, unspecified obesity type Rumaysa is currently in the action stage of change. As such, her goal is to continue with weight loss efforts. She has agreed to keeping a food journal and adhering to recommended goals of 1000 calories and 75+ grams of protein.   Exercise goals: As is.  Behavioral modification strategies: increasing water intake.  Fatim has agreed to follow-up with our clinic in 2 weeks. She was informed of the importance of frequent follow-up visits to maximize her success with intensive lifestyle modifications for her multiple health conditions.   Objective:   Blood pressure (!) 131/57, pulse 84, temperature 98.7 F (37.1 C), temperature source Oral, height 5\' 2"  (1.575 m), weight 187 lb (84.8 kg), SpO2 97 %. Body mass index is 34.2 kg/m.  General: Cooperative, alert, well developed, in no acute distress. HEENT: Conjunctivae and lids unremarkable. Cardiovascular: Regular rhythm.  Lungs: Normal work of breathing. Neurologic: No focal deficits.   Lab Results  Component Value Date   CREATININE 0.7 06/08/2019   BUN 14 06/08/2019   NA 140 06/08/2019   K 4.4 06/08/2019   CL 104 06/08/2019   CO2 27 (A) 06/08/2019   Lab Results  Component Value Date   ALT  15 06/08/2019   AST 13 06/08/2019   Lab Results  Component Value Date   HGBA1C 5.7 06/08/2019   Lab Results  Component Value Date   CHOL 173 06/08/2019   TRIG 108 06/08/2019   Lab Results  Component Value Date   WBC 6.1 06/08/2019   HGB 13.6 06/08/2019   HCT 39 06/08/2019   MCV 87.8 09/02/2011   PLT 229 06/08/2019   Obesity Behavioral Intervention:   Approximately 15 minutes were spent on the discussion below.  ASK: We discussed the diagnosis of obesity with [name] today and Pranika agreed to give [place] permission to discuss obesity behavioral modification therapy today.  ASSESS: Otila has the diagnosis of obesity and her BMI today is 34.2. Alicja is in the action stage of change.   ADVISE: Nneoma was educated on the multiple health risks of obesity as well as the benefit of weight loss to improve her health. She was advised of the need for long term treatment and the importance of lifestyle modifications to improve her current health and to decrease her risk of future health problems.  AGREE: Multiple dietary modification options and treatment options were discussed and Shaylei agreed to follow the recommendations documented in the above note.  ARRANGE: Bridgitte was educated on the importance of frequent visits to treat obesity as outlined per CMS and USPSTF guidelines and agreed to schedule her next follow up appointment today.  Attestation Statements:   Reviewed by clinician on day of visit: allergies, medications, problem list, medical history, surgical history, family history, social history, and previous encounter notes.  I, Water quality scientist, CMA, am acting as Location manager for [employer], DO.  I have reviewed the above documentation for accuracy and completeness, and I agree with the above. Briscoe Deutscher, DO

## 2019-08-29 ENCOUNTER — Encounter (INDEPENDENT_AMBULATORY_CARE_PROVIDER_SITE_OTHER): Payer: Self-pay | Admitting: Family Medicine

## 2019-09-12 ENCOUNTER — Ambulatory Visit (INDEPENDENT_AMBULATORY_CARE_PROVIDER_SITE_OTHER): Payer: Medicare Other | Admitting: Family Medicine

## 2019-09-15 ENCOUNTER — Other Ambulatory Visit: Payer: Self-pay

## 2019-09-15 ENCOUNTER — Ambulatory Visit (INDEPENDENT_AMBULATORY_CARE_PROVIDER_SITE_OTHER): Payer: Medicare Other | Admitting: Family Medicine

## 2019-09-15 ENCOUNTER — Encounter (INDEPENDENT_AMBULATORY_CARE_PROVIDER_SITE_OTHER): Payer: Self-pay | Admitting: Family Medicine

## 2019-09-15 VITALS — BP 124/76 | HR 75 | Temp 98.0°F | Ht 62.0 in | Wt 185.0 lb

## 2019-09-15 DIAGNOSIS — Z9189 Other specified personal risk factors, not elsewhere classified: Secondary | ICD-10-CM

## 2019-09-15 DIAGNOSIS — Z6833 Body mass index (BMI) 33.0-33.9, adult: Secondary | ICD-10-CM | POA: Diagnosis not present

## 2019-09-15 DIAGNOSIS — M549 Dorsalgia, unspecified: Secondary | ICD-10-CM

## 2019-09-15 DIAGNOSIS — E119 Type 2 diabetes mellitus without complications: Secondary | ICD-10-CM

## 2019-09-15 DIAGNOSIS — E669 Obesity, unspecified: Secondary | ICD-10-CM | POA: Diagnosis not present

## 2019-09-15 DIAGNOSIS — E1169 Type 2 diabetes mellitus with other specified complication: Secondary | ICD-10-CM

## 2019-09-15 DIAGNOSIS — E1159 Type 2 diabetes mellitus with other circulatory complications: Secondary | ICD-10-CM

## 2019-09-15 DIAGNOSIS — I152 Hypertension secondary to endocrine disorders: Secondary | ICD-10-CM

## 2019-09-15 DIAGNOSIS — G8929 Other chronic pain: Secondary | ICD-10-CM

## 2019-09-15 DIAGNOSIS — I10 Essential (primary) hypertension: Secondary | ICD-10-CM

## 2019-09-15 DIAGNOSIS — E785 Hyperlipidemia, unspecified: Secondary | ICD-10-CM

## 2019-09-15 NOTE — Progress Notes (Signed)
Chief Complaint:   OBESITY Janice Pace is here to discuss her progress with her obesity treatment plan along with follow-up of her obesity related diagnoses. Janice Pace is on the Category 1 Plan and states she is following her eating plan approximately 98% of the time. Janice Pace states she is doing water aerobics for 2.5 hours 3 times per week.  Today's visit was #: 3 Starting weight: 191 lbs Starting date: 08/11/2019 Today's weight: 185 lbs Today's date: 09/15/2019 Total lbs lost to date: 6 lbs Total lbs lost since last in-office visit: 2 lbs  Interim History: Janice Pace is still not getting enough protein.  She is down 1/2 bottle of diet soda.  Subjective:   1. Type 2 diabetes mellitus without complication, without long-term current use of insulin (Janice Pace) Janice Pace is not currently on any medications for diabetes.  Lab Results  Component Value Date   HGBA1C 5.7 06/08/2019   Lab Results  Component Value Date   CREATININE 0.7 06/08/2019   2. Hyperlipidemia associated with type 2 diabetes mellitus ([place]) Janice Pace has hyperlipidemia and has been trying to improve her cholesterol levels with intensive lifestyle modification including a low saturated fat diet, exercise and weight loss. She denies any chest pain, claudication or myalgias.  She is taking Crestor 5 mg daily.  Lab Results  Component Value Date   ALT 15 06/08/2019   AST 13 06/08/2019   Lab Results  Component Value Date   CHOL 173 06/08/2019   TRIG 108 06/08/2019   3. Hypertension associated with diabetes ([place]) Review: taking medications as instructed, no medication side effects noted, no chest pain on exertion, no dyspnea on exertion, no swelling of ankles.   BP Readings from Last 3 Encounters:  09/15/19 124/76  08/25/19 (!) 131/57  08/11/19 (!) 146/83   4. Chronic back pain, unspecified back location, unspecified back pain laterality Janice Pace suffers from chronic back pain.  5. At risk for heart disease Janice Pace was given approximately  15 minutes of coronary artery disease prevention counseling today. She is 74 y.o. female and has risk factors for heart disease including obesity. We discussed intensive lifestyle modifications today with an emphasis on specific weight loss instructions and strategies.   Repetitive spaced learning was employed today to elicit superior memory formation and behavioral change.  Assessment/Plan:   1. Type 2 diabetes mellitus without complication, without long-term current use of insulin (HCC) Good blood sugar control is important to decrease the likelihood of diabetic complications such as nephropathy, neuropathy, limb loss, blindness, coronary artery disease, and death. Intensive lifestyle modification including diet, exercise and weight loss are the first line of treatment for diabetes.   2. Hyperlipidemia associated with type 2 diabetes mellitus ([place]) Cardiovascular risk and specific lipid/LDL goals reviewed.  We discussed several lifestyle modifications today and Janice Pace Janice Pace continue to work on diet, exercise and weight loss efforts. Orders and follow up as documented in patient record.   Counseling Intensive lifestyle modifications are the first line treatment for this issue. . Dietary changes: Increase soluble fiber. Decrease simple carbohydrates. . Exercise changes: Moderate to vigorous-intensity aerobic activity 150 minutes per week if tolerated. . Lipid-lowering medications: see documented in medical record.  3. Hypertension associated with diabetes ([place]) Janice Pace is working on healthy weight loss and exercise to improve blood pressure control. We Janice Pace watch for signs of hypotension as she continues her lifestyle modifications.  4. Chronic back pain, unspecified back location, unspecified back pain laterality Janice Pace follow because mobility and pain control are important  for weight management.  5. At risk for heart disease Janice Pace was given approximately 15 minutes of coronary artery disease  prevention counseling today. She is 74 y.o. female and has risk factors for heart disease including obesity. We discussed intensive lifestyle modifications today with an emphasis on specific weight loss instructions and strategies.   Repetitive spaced learning was employed today to elicit superior memory formation and behavioral change.  6. Class 1 obesity with serious comorbidity and body mass index (BMI) of 33.0 to 33.9 in adult, unspecified obesity type Janice Pace is currently in the action stage of change. As such, her goal is to continue with weight loss efforts. She has agreed to the Category 1 Plan.   Exercise goals: As is.  Increase intensity.  Behavioral modification strategies: increasing lean protein intake and decreasing simple carbohydrates.  Janice Pace has agreed to follow-up with our clinic in 2 weeks. She was informed of the importance of frequent follow-up visits to maximize her success with intensive lifestyle modifications for her multiple health conditions.   Objective:   Blood pressure 124/76, pulse 75, temperature 98 F (36.7 C), temperature source Oral, height 5\' 2"  (1.575 m), weight 185 lb (83.9 kg), SpO2 96 %. Body mass index is 33.84 kg/m.  General: Cooperative, alert, well developed, in no acute distress. HEENT: Conjunctivae and lids unremarkable. Cardiovascular: Regular rhythm.  Lungs: Normal work of breathing. Neurologic: No focal deficits.   Lab Results  Component Value Date   CREATININE 0.7 06/08/2019   BUN 14 06/08/2019   NA 140 06/08/2019   K 4.4 06/08/2019   CL 104 06/08/2019   CO2 27 (A) 06/08/2019   Lab Results  Component Value Date   ALT 15 06/08/2019   AST 13 06/08/2019   Lab Results  Component Value Date   HGBA1C 5.7 06/08/2019   No results found for: INSULIN No results found for: TSH Lab Results  Component Value Date   CHOL 173 06/08/2019   TRIG 108 06/08/2019   Lab Results  Component Value Date   WBC 6.1 06/08/2019   HGB 13.6  06/08/2019   HCT 39 06/08/2019   MCV 87.8 09/02/2011   PLT 229 06/08/2019   Attestation Statements:   Reviewed by clinician on day of visit: allergies, medications, problem list, medical history, surgical history, family history, social history, and previous encounter notes.  I, Water quality scientist, CMA, am acting as Location manager for [employer], DO.  I have reviewed the above documentation for accuracy and completeness, and I agree with the above. Briscoe Deutscher, DO

## 2019-10-03 ENCOUNTER — Other Ambulatory Visit: Payer: Self-pay

## 2019-10-03 ENCOUNTER — Encounter (INDEPENDENT_AMBULATORY_CARE_PROVIDER_SITE_OTHER): Payer: Self-pay | Admitting: Family Medicine

## 2019-10-03 ENCOUNTER — Ambulatory Visit (INDEPENDENT_AMBULATORY_CARE_PROVIDER_SITE_OTHER): Payer: Medicare Other | Admitting: Family Medicine

## 2019-10-03 VITALS — BP 138/80 | HR 65 | Temp 97.9°F | Ht 62.0 in | Wt 184.0 lb

## 2019-10-03 DIAGNOSIS — E559 Vitamin D deficiency, unspecified: Secondary | ICD-10-CM | POA: Insufficient documentation

## 2019-10-03 DIAGNOSIS — E785 Hyperlipidemia, unspecified: Secondary | ICD-10-CM

## 2019-10-03 DIAGNOSIS — R0602 Shortness of breath: Secondary | ICD-10-CM

## 2019-10-03 DIAGNOSIS — Z6833 Body mass index (BMI) 33.0-33.9, adult: Secondary | ICD-10-CM | POA: Diagnosis not present

## 2019-10-03 DIAGNOSIS — E1169 Type 2 diabetes mellitus with other specified complication: Secondary | ICD-10-CM

## 2019-10-03 DIAGNOSIS — E7849 Other hyperlipidemia: Secondary | ICD-10-CM | POA: Insufficient documentation

## 2019-10-03 DIAGNOSIS — E669 Obesity, unspecified: Secondary | ICD-10-CM | POA: Diagnosis not present

## 2019-10-03 DIAGNOSIS — Z6834 Body mass index (BMI) 34.0-34.9, adult: Secondary | ICD-10-CM | POA: Insufficient documentation

## 2019-10-03 DIAGNOSIS — E782 Mixed hyperlipidemia: Secondary | ICD-10-CM | POA: Insufficient documentation

## 2019-10-03 NOTE — Progress Notes (Signed)
Chief Complaint:   OBESITY Janice Pace is here to discuss her progress with her obesity treatment plan along with follow-up of her obesity related diagnoses. Janice Pace is on the Category 1 Plan and states she is following her eating plan approximately 97% of the time. [name] states she is doing water aerobics for 120 minutes 3 times per week.  Today's visit was #: 4 Starting weight: 191 lbs Starting date: 08/11/2019 Today's weight: 184 lbs Today's date: 10/03/2019 Total lbs lost to date: 7 Total lbs lost since last in-office visit: 1  Interim History: [name] reports that she sometimes substitutes a protein shake for some of the protein on the plan. She struggles to get all of the protein in.  Subjective:   1. Other hyperlipidemia Janice Pace is on Crestor 5 mg. I do not have a record of her last FLP, but she reports it was good. She denies a history of heart issues. She denies chest pain. She reports a history of aorta blockage.  2. Vitamin D deficiency Janice Pace is not on Vit D supplementations.  3. Type 2 diabetes mellitus without complication, without long-term current use of insulin ([place]) Janice Pace reports she is pre-diabetic. Last A1c was 5.7. She is not on metformin. A1c has been as high as 7.8. She denies polyphagia.  4. Shortness of breath [name] reports shortness of breath with walking, but not with water aerobics.  Assessment/Plan:   1. Other hyperlipidemia Cardiovascular risk and specific lipid/LDL goals reviewed. We discussed several lifestyle modifications today and Lourie will continue to work on diet, exercise and weight loss efforts. We will check labs today. Orders and follow up as documented in patient record.   Counseling Intensive lifestyle modifications are the first line treatment for this issue. . Dietary changes: Increase soluble fiber. Decrease simple carbohydrates. . Exercise changes: Moderate to vigorous-intensity aerobic activity 150 minutes per week if  tolerated. . Lipid-lowering medications: see documented in medical record.  - Comprehensive metabolic panel - Hemoglobin A1c - Insulin, random  2. Vitamin D deficiency Low Vitamin D level contributes to fatigue and are associated with obesity, breast, and colon cancer. [name] will follow-up for routine testing of Vitamin D, at least 2-3 times per year to avoid over-replacement. We will check labs today.  - VITAMIN D 25 Hydroxy (Vit-D Deficiency, Fractures)  3. Type 2 diabetes mellitus without complication, without long-term current use of insulin (HCC) Good blood sugar control is important to decrease the likelihood of diabetic complications such as nephropathy, neuropathy, limb loss, blindness, coronary artery disease, and death. Intensive lifestyle modification including diet, exercise and weight loss are the first line of treatment for diabetes. We will check labs today.  - Hemoglobin A1c - Insulin, random - Lipid Panel With LDL/HDL Ratio  4. Shortness of breath Janice Pace does feel that she gets out of breath more easily that she used to when she exercises. Janice Pace's shortness of breath appears to be obesity related and exercise induced. She has agreed to work on weight loss and gradually increase exercise to treat her exercise induced shortness of breath. Will continue to monitor closely. We will check labs today.  - T3 - T4, free - TSH  5. Class 1 obesity with serious comorbidity and body mass index (BMI) of 33.0 to 33.9 in adult, unspecified obesity type Janice Pace is currently in the action stage of change. As such, her goal is to continue with weight loss efforts. She has agreed to the Category 1 Plan.   Handouts given: Protein  Equivalents. Janice Pace may have 1 cup of cottage cheese with fruit for lunch.  Exercise goals: As is.  Behavioral modification strategies: increasing lean protein intake.  Janice Pace has agreed to follow-up with our clinic in 2 weeks. She was informed of the importance  of frequent follow-up visits to maximize her success with intensive lifestyle modifications for her multiple health conditions.   [name] was informed we would discuss her lab results at her next visit unless there is a critical issue that needs to be addressed sooner. Janice Pace agreed to keep her next visit at the agreed upon time to discuss these results.  Objective:   Blood pressure 138/80, pulse 65, temperature 97.9 F (36.6 C), temperature source Oral, height 5\' 2"  (1.575 m), weight 184 lb (83.5 kg), SpO2 97 %. Body mass index is 33.65 kg/m.  General: Cooperative, alert, well developed, in no acute distress. HEENT: Conjunctivae and lids unremarkable. Cardiovascular: Regular rhythm.  Lungs: Normal work of breathing. Neurologic: No focal deficits.   Lab Results  Component Value Date   CREATININE 0.7 06/08/2019   BUN 14 06/08/2019   NA 140 06/08/2019   K 4.4 06/08/2019   CL 104 06/08/2019   CO2 27 (A) 06/08/2019   Lab Results  Component Value Date   ALT 15 06/08/2019   AST 13 06/08/2019   Lab Results  Component Value Date   HGBA1C 5.7 06/08/2019   No results found for: INSULIN No results found for: TSH Lab Results  Component Value Date   CHOL 173 06/08/2019   TRIG 108 06/08/2019   Lab Results  Component Value Date   WBC 6.1 06/08/2019   HGB 13.6 06/08/2019   HCT 39 06/08/2019   MCV 87.8 09/02/2011   PLT 229 06/08/2019   No results found for: IRON, TIBC, FERRITIN  Obesity Behavioral Intervention Documentation for Insurance:   Approximately 15 minutes were spent on the discussion below.  ASK: We discussed the diagnosis of obesity with Janice Pace today and Janice Pace agreed to give [place] permission to discuss obesity behavioral modification therapy today.  ASSESS: Janice Pace has the diagnosis of obesity and her BMI today is 33.65. Janice Pace is in the action stage of change.   ADVISE: [name] was educated on the multiple health risks of obesity as well as the benefit of weight loss to  improve her health. She was advised of the need for long term treatment and the importance of lifestyle modifications to improve her current health and to decrease her risk of future health problems.  AGREE: Multiple dietary modification options and treatment options were discussed and Tyann agreed to follow the recommendations documented in the above note.  ARRANGE: Christalle was educated on the importance of frequent visits to treat obesity as outlined per CMS and USPSTF guidelines and agreed to schedule her next follow up appointment today.  Attestation Statements:   Reviewed by clinician on day of visit: allergies, medications, problem list, medical history, surgical history, family history, social history, and previous encounter notes.   Wilhemena Durie, am acting as Location manager for [employer], FNP-C.  I have reviewed the above documentation for accuracy and completeness, and I agree with the above. -  Georgianne Fick, FNP

## 2019-10-04 LAB — LIPID PANEL WITH LDL/HDL RATIO
Cholesterol, Total: 183 mg/dL (ref 100–199)
HDL: 47 mg/dL (ref >39)
LDL Chol Calc (NIH): 114 mg/dL — ABNORMAL HIGH (ref 0–99)
LDL/HDL Ratio: 2.4 ratio (ref 0.0–3.2)
Triglycerides: 122 mg/dL (ref 0–149)
VLDL Cholesterol Cal: 22 mg/dL (ref 5–40)

## 2019-10-04 LAB — COMPREHENSIVE METABOLIC PANEL
ALT: 11 IU/L (ref 0–32)
AST: 12 IU/L (ref 0–40)
Albumin/Globulin Ratio: 2 (ref 1.2–2.2)
Albumin: 4.2 g/dL (ref 3.7–4.7)
Alkaline Phosphatase: 98 IU/L (ref 39–117)
BUN/Creatinine Ratio: 25 (ref 12–28)
BUN: 19 mg/dL (ref 8–27)
Bilirubin Total: 0.4 mg/dL (ref 0.0–1.2)
CO2: 23 mmol/L (ref 20–29)
Calcium: 9.5 mg/dL (ref 8.7–10.3)
Chloride: 104 mmol/L (ref 96–106)
Creatinine, Ser: 0.76 mg/dL (ref 0.57–1.00)
GFR calc Af Amer: 90 mL/min/{1.73_m2} (ref >59)
GFR calc non Af Amer: 78 mL/min/{1.73_m2} (ref >59)
Globulin, Total: 2.1 g/dL (ref 1.5–4.5)
Glucose: 111 mg/dL — ABNORMAL HIGH (ref 65–99)
Potassium: 4.5 mmol/L (ref 3.5–5.2)
Sodium: 141 mmol/L (ref 134–144)
Total Protein: 6.3 g/dL (ref 6.0–8.5)

## 2019-10-04 LAB — HEMOGLOBIN A1C
Est. average glucose Bld gHb Est-mCnc: 117 mg/dL
Hgb A1c MFr Bld: 5.7 % — ABNORMAL HIGH (ref 4.8–5.6)

## 2019-10-04 LAB — INSULIN, RANDOM: INSULIN: 21 u[IU]/mL (ref 2.6–24.9)

## 2019-10-04 LAB — TSH: TSH: 2.1 u[IU]/mL (ref 0.450–4.500)

## 2019-10-04 LAB — VITAMIN D 25 HYDROXY (VIT D DEFICIENCY, FRACTURES): Vit D, 25-Hydroxy: 28.6 ng/mL — ABNORMAL LOW (ref 30.0–100.0)

## 2019-10-04 LAB — T3: T3, Total: 116 ng/dL (ref 71–180)

## 2019-10-04 LAB — T4, FREE: Free T4: 1.33 ng/dL (ref 0.82–1.77)

## 2019-10-17 ENCOUNTER — Encounter (INDEPENDENT_AMBULATORY_CARE_PROVIDER_SITE_OTHER): Payer: Self-pay | Admitting: Family Medicine

## 2019-10-17 ENCOUNTER — Other Ambulatory Visit: Payer: Self-pay

## 2019-10-17 ENCOUNTER — Ambulatory Visit (INDEPENDENT_AMBULATORY_CARE_PROVIDER_SITE_OTHER): Payer: Medicare Other | Admitting: Family Medicine

## 2019-10-17 VITALS — BP 133/76 | HR 79 | Temp 98.7°F | Ht 62.0 in | Wt 183.0 lb

## 2019-10-17 DIAGNOSIS — E559 Vitamin D deficiency, unspecified: Secondary | ICD-10-CM

## 2019-10-17 DIAGNOSIS — E669 Obesity, unspecified: Secondary | ICD-10-CM

## 2019-10-17 DIAGNOSIS — Z6833 Body mass index (BMI) 33.0-33.9, adult: Secondary | ICD-10-CM

## 2019-10-17 DIAGNOSIS — R7303 Prediabetes: Secondary | ICD-10-CM

## 2019-10-17 DIAGNOSIS — E7849 Other hyperlipidemia: Secondary | ICD-10-CM

## 2019-10-17 MED ORDER — VITAMIN D (ERGOCALCIFEROL) 1.25 MG (50000 UNIT) PO CAPS: 50000.0000 [IU] | ORAL_CAPSULE | ORAL | 0 refills | Status: DC

## 2019-10-17 MED ORDER — ROSUVASTATIN CALCIUM 10 MG PO TABS: 10.0000 mg | ORAL_TABLET | Freq: Every day | ORAL | 0 refills | Status: DC

## 2019-10-18 ENCOUNTER — Encounter (INDEPENDENT_AMBULATORY_CARE_PROVIDER_SITE_OTHER): Payer: Self-pay | Admitting: Family Medicine

## 2019-10-18 DIAGNOSIS — R7303 Prediabetes: Secondary | ICD-10-CM | POA: Insufficient documentation

## 2019-10-18 NOTE — Progress Notes (Signed)
Chief Complaint:   OBESITY Janice Pace is here to discuss her progress with her obesity treatment plan along with follow-up of her obesity related diagnoses. Janice Pace is on the Category 1 Plan and states she is following her eating plan approximately 70% of the time. Janice Pace states she is doing water aerobics for 120 minutes 3-4 times per week.  Today's visit was #: 5 Starting weight: 191 lbs Starting date: 08/11/2019 Today's weight: 183 lbs Today's date: 10/17/2019 Total lbs lost to date: 8 Total lbs lost since last in-office visit: 1  Interim History: Janice Pace reports boredom with the food on the plan. She is not skipping meals as much as she had in the past.  Subjective:   1. Prediabetes. She reports she is pre-diabetic rather than diabetic. I am unable to find an A1c greater than 5.7 in Epic. Scan from [facility] lists A1c as 5.7 in Feb 2021.  She denies polyphagia.   2. Other hyperlipidemia Janice Pace's LDL is not at goal. Last LDL was 114 and HDL low at 47. Her ASCVD risk is 25.2%. She denies myalgias on Crestor 5 mg. I discussed labs with the patient today.  3. Vitamin D deficiency Janice Pace's last Vit D level was low at 28.6. She has not been on Vit D supplementations. I discussed labs with the patient today.  Assessment/Plan:   1. Prediabetes. Continue meal plan.  2. Other hyperlipidemia Cardiovascular risk and specific lipid/LDL goals reviewed. We discussed several lifestyle modifications today and Janice Pace will continue to work on diet, exercise and weight loss efforts. Janice Pace agreed to increase Crestor to 10 mg q daily with 90 day supply with no refills. Orders and follow up as documented in patient record.   Counseling Intensive lifestyle modifications are the first line treatment for this issue. . Dietary changes: Increase soluble fiber. Decrease simple carbohydrates. . Exercise changes: Moderate to vigorous-intensity aerobic activity 150 minutes per week if tolerated. . Lipid-lowering  medications: see documented in medical record.  - rosuvastatin (CRESTOR) 10 MG tablet; Take 1 tablet (10 mg total) by mouth daily.  Dispense: 90 tablet; Refill: 0  3. Vitamin D deficiency Low Vitamin D level contributes to fatigue and are associated with obesity, breast, and colon cancer. Janice Pace agreed to start prescription Vitamin D 50,000 IU every week with 90 day supply with no refills. She will follow-up for routine testing of Vitamin D, at least 2-3 times per year to avoid over-replacement.  - Vitamin D, Ergocalciferol, (DRISDOL) 1.25 MG (50000 UNIT) CAPS capsule; Take 1 capsule (50,000 Units total) by mouth every 7 (seven) days.  Dispense: 12 capsule; Refill: 0  4. Class 1 obesity with serious comorbidity and body mass index (BMI) of 33.0 to 33.9 in adult, unspecified obesity type Janice Pace is currently in the action stage of change. As such, her goal is to continue with weight loss efforts. She has agreed to the Category 1 Plan and keeping a food journal and adhering to recommended goals of 300-450 calories and 35 grams of protein daily.   Handouts given today: Journaling, Eating Out, and Protein content of food.  Exercise goals: As is.  Behavioral modification strategies: increasing lean protein intake, decreasing simple carbohydrates, planning for success and keeping a strict food journal.  Janice Pace has agreed to follow-up with our clinic in 2 weeks. She was informed of the importance of frequent follow-up visits to maximize her success with intensive lifestyle modifications for her multiple health conditions.   Objective:   Blood pressure 133/76, pulse  79, temperature 98.7 F (37.1 C), temperature source Oral, height 5\' 2"  (1.575 m), weight 183 lb (83 kg), SpO2 96 %. Body mass index is 33.47 kg/m.  General: Cooperative, alert, well developed, in no acute distress. HEENT: Conjunctivae and lids unremarkable. Cardiovascular: Regular rhythm.  Lungs: Normal work of breathing. Neurologic:  No focal deficits.   Lab Results  Component Value Date   CREATININE 0.76 10/03/2019   BUN 19 10/03/2019   NA 141 10/03/2019   K 4.5 10/03/2019   CL 104 10/03/2019   CO2 23 10/03/2019   Lab Results  Component Value Date   ALT 11 10/03/2019   AST 12 10/03/2019   ALKPHOS 98 10/03/2019   BILITOT 0.4 10/03/2019   Lab Results  Component Value Date   HGBA1C 5.7 (H) 10/03/2019   HGBA1C 5.7 06/08/2019   Lab Results  Component Value Date   INSULIN 21.0 10/03/2019   Lab Results  Component Value Date   TSH 2.100 10/03/2019   Lab Results  Component Value Date   CHOL 183 10/03/2019   HDL 47 10/03/2019   LDLCALC 114 (H) 10/03/2019   TRIG 122 10/03/2019   Lab Results  Component Value Date   WBC 6.1 06/08/2019   HGB 13.6 06/08/2019   HCT 39 06/08/2019   MCV 87.8 09/02/2011   PLT 229 06/08/2019   No results found for: IRON, TIBC, FERRITIN  Obesity Behavioral Intervention Documentation for Insurance:   Approximately 15 minutes were spent on the discussion below.  ASK: We discussed the diagnosis of obesity with Janice Pace today and Janice Pace agreed to give [place] permission to discuss obesity behavioral modification therapy today.  ASSESS: Janice Pace has the diagnosis of obesity and her BMI today is 33.46. Janice Pace is in the action stage of change.   ADVISE: Janice Pace was educated on the multiple health risks of obesity as well as the benefit of weight loss to improve her health. She was advised of the need for long term treatment and the importance of lifestyle modifications to improve her current health and to decrease her risk of future health problems.  AGREE: Multiple dietary modification options and treatment options were discussed and Janice Pace agreed to follow the recommendations documented in the above note.  ARRANGE: Janice Pace was educated on the importance of frequent visits to treat obesity as outlined per CMS and USPSTF guidelines and agreed to schedule her next follow up appointment  today.  Attestation Statements:   Reviewed by clinician on day of visit: allergies, medications, problem list, medical history, surgical history, family history, social history, and previous encounter notes.   Wilhemena Durie, am acting as Location manager for [employer], FNP-C.  I have reviewed the above documentation for accuracy and completeness, and I agree with the above. -  Georgianne Fick, FNP

## 2019-11-07 ENCOUNTER — Other Ambulatory Visit: Payer: Self-pay

## 2019-11-07 ENCOUNTER — Encounter (INDEPENDENT_AMBULATORY_CARE_PROVIDER_SITE_OTHER): Payer: Self-pay | Admitting: Family Medicine

## 2019-11-07 ENCOUNTER — Ambulatory Visit (INDEPENDENT_AMBULATORY_CARE_PROVIDER_SITE_OTHER): Payer: Medicare Other | Admitting: Family Medicine

## 2019-11-07 VITALS — BP 126/58 | HR 65 | Temp 99.0°F | Ht 62.0 in | Wt 182.0 lb

## 2019-11-07 DIAGNOSIS — E669 Obesity, unspecified: Secondary | ICD-10-CM

## 2019-11-07 DIAGNOSIS — R7303 Prediabetes: Secondary | ICD-10-CM | POA: Diagnosis not present

## 2019-11-07 DIAGNOSIS — E7849 Other hyperlipidemia: Secondary | ICD-10-CM

## 2019-11-07 DIAGNOSIS — Z6833 Body mass index (BMI) 33.0-33.9, adult: Secondary | ICD-10-CM | POA: Diagnosis not present

## 2019-11-07 DIAGNOSIS — E559 Vitamin D deficiency, unspecified: Secondary | ICD-10-CM

## 2019-11-08 NOTE — Progress Notes (Signed)
Chief Complaint:   OBESITY Delayna is here to discuss her progress with her obesity treatment plan along with follow-up of her obesity related diagnoses. Haizel is on the Category 1 Plan and states she is following her eating plan approximately 20% of the time. Era states she is doing water aerobics for 120 minutes 3 times per week.  Today's visit was #: 6 Starting weight: 191 lbs Starting date: 08/11/2019 Today's weight: 182 lbs Today's date: 11/07/2019 Total lbs lost to date: 9 lbs Total lbs lost since last in-office visit: 1 lb  Interim History: Marcos says that she is drinking too many Diet Pepsi's and not enough water.  Subjective:   1. Vitamin D deficiency Luana's Vitamin D level was 28.6 on 10/03/2019. She is currently taking prescription vitamin D 50,000 IU each week. She denies nausea, vomiting or muscle weakness.  2. Other hyperlipidemia Scherri has hyperlipidemia and has been trying to improve her cholesterol levels with intensive lifestyle modification including a low saturated fat diet, exercise and weight loss. She denies any chest pain, claudication or myalgias.  She is taking Crestor.  Lab Results  Component Value Date   ALT 11 10/03/2019   AST 12 10/03/2019   ALKPHOS 98 10/03/2019   BILITOT 0.4 10/03/2019   Lab Results  Component Value Date   CHOL 183 10/03/2019   HDL 47 10/03/2019   LDLCALC 114 (H) 10/03/2019   TRIG 122 10/03/2019   3. Prediabetes Natisha has a diagnosis of prediabetes based on her elevated HgA1c and was informed this puts her at greater risk of developing diabetes. She continues to work on diet and exercise to decrease her risk of diabetes. She denies nausea or hypoglycemia.  Lab Results  Component Value Date   HGBA1C 5.7 (H) 10/03/2019   Lab Results  Component Value Date   INSULIN 21.0 10/03/2019   Assessment/Plan:   1. Vitamin D deficiency Low Vitamin D level contributes to fatigue and are associated with obesity, breast, and  colon cancer. She agrees to continue to take prescription Vitamin D @50 ,000 IU every week and will follow-up for routine testing of Vitamin D, at least 2-3 times per year to avoid over-replacement.  2. Other hyperlipidemia Cardiovascular risk and specific lipid/LDL goals reviewed.  We discussed several lifestyle modifications today and Krystalin will continue to work on diet, exercise and weight loss efforts. Orders and follow up as documented in patient record.   Counseling Intensive lifestyle modifications are the first line treatment for this issue. . Dietary changes: Increase soluble fiber. Decrease simple carbohydrates. . Exercise changes: Moderate to vigorous-intensity aerobic activity 150 minutes per week if tolerated. . Lipid-lowering medications: see documented in medical record.  3. Prediabetes Siera will continue to work on weight loss, exercise, and decreasing simple carbohydrates to help decrease the risk of diabetes.   4. Class 1 obesity with serious comorbidity and body mass index (BMI) of 33.0 to 33.9 in adult, unspecified obesity type Sandye is currently in the action stage of change. As such, her goal is to continue with weight loss efforts. She has agreed to the Category 1 Plan.   Exercise goals: As is.  Behavioral modification strategies: increasing water intake.  Marce has agreed to follow-up with our clinic in 2 weeks. She was informed of the importance of frequent follow-up visits to maximize her success with intensive lifestyle modifications for her multiple health conditions.   Objective:   Blood pressure (!) 126/58, pulse 65, temperature 99 F (37.2 C),  temperature source Oral, height 5\' 2"  (1.575 m), weight 182 lb (82.6 kg), SpO2 98 %. Body mass index is 33.29 kg/m.  General: Cooperative, alert, well developed, in no acute distress. HEENT: Conjunctivae and lids unremarkable. Cardiovascular: Regular rhythm.  Lungs: Normal work of breathing. Neurologic: No focal  deficits.   Lab Results  Component Value Date   CREATININE 0.76 10/03/2019   BUN 19 10/03/2019   NA 141 10/03/2019   K 4.5 10/03/2019   CL 104 10/03/2019   CO2 23 10/03/2019   Lab Results  Component Value Date   ALT 11 10/03/2019   AST 12 10/03/2019   ALKPHOS 98 10/03/2019   BILITOT 0.4 10/03/2019   Lab Results  Component Value Date   HGBA1C 5.7 (H) 10/03/2019   HGBA1C 5.7 06/08/2019   Lab Results  Component Value Date   INSULIN 21.0 10/03/2019   Lab Results  Component Value Date   TSH 2.100 10/03/2019   Lab Results  Component Value Date   CHOL 183 10/03/2019   HDL 47 10/03/2019   LDLCALC 114 (H) 10/03/2019   TRIG 122 10/03/2019   Lab Results  Component Value Date   WBC 6.1 06/08/2019   HGB 13.6 06/08/2019   HCT 39 06/08/2019   MCV 87.8 09/02/2011   PLT 229 06/08/2019   Obesity Behavioral Intervention:   Approximately 15 minutes were spent on the discussion below.  ASK: We discussed the diagnosis of obesity with [name] today and Kashyra agreed to give [place] permission to discuss obesity behavioral modification therapy today.  ASSESS: Tamelia has the diagnosis of obesity and her BMI today is 33.4. Timira is in the action stage of change.   ADVISE: Aryian was educated on the multiple health risks of obesity as well as the benefit of weight loss to improve her health. She was advised of the need for long term treatment and the importance of lifestyle modifications to improve her current health and to decrease her risk of future health problems.  AGREE: Multiple dietary modification options and treatment options were discussed and Deyra agreed to follow the recommendations documented in the above note.  ARRANGE: Moshe was educated on the importance of frequent visits to treat obesity as outlined per CMS and USPSTF guidelines and agreed to schedule her next follow up appointment today.  Attestation Statements:   Reviewed by clinician on day of visit: allergies,  medications, problem list, medical history, surgical history, family history, social history, and previous encounter notes.  I, Water quality scientist, CMA, am acting as Location manager for [employer], DO.  I have reviewed the above documentation for accuracy and completeness, and I agree with the above. Briscoe Deutscher, DO

## 2019-11-28 ENCOUNTER — Ambulatory Visit (INDEPENDENT_AMBULATORY_CARE_PROVIDER_SITE_OTHER): Payer: Medicare Other | Admitting: Family Medicine

## 2019-11-28 ENCOUNTER — Encounter (INDEPENDENT_AMBULATORY_CARE_PROVIDER_SITE_OTHER): Payer: Self-pay | Admitting: Family Medicine

## 2019-11-28 ENCOUNTER — Other Ambulatory Visit: Payer: Self-pay

## 2019-11-28 VITALS — BP 132/81 | HR 71 | Temp 98.2°F | Ht 62.0 in | Wt 180.0 lb

## 2019-11-28 DIAGNOSIS — R7303 Prediabetes: Secondary | ICD-10-CM | POA: Diagnosis not present

## 2019-11-28 DIAGNOSIS — E559 Vitamin D deficiency, unspecified: Secondary | ICD-10-CM | POA: Diagnosis not present

## 2019-11-28 DIAGNOSIS — E669 Obesity, unspecified: Secondary | ICD-10-CM | POA: Diagnosis not present

## 2019-11-28 DIAGNOSIS — E7800 Pure hypercholesterolemia, unspecified: Secondary | ICD-10-CM

## 2019-11-28 DIAGNOSIS — Z6833 Body mass index (BMI) 33.0-33.9, adult: Secondary | ICD-10-CM

## 2019-11-28 NOTE — Progress Notes (Signed)
Chief Complaint:   OBESITY Liv is here to discuss her progress with her obesity treatment plan along with follow-up of her obesity related diagnoses. Jelisa is on the Category 1 Plan and states she is following her eating plan approximately 70% of the time. Sapphyre states she is doing water aerobics for 120 minutes 3 times per week.  Today's visit was #: 7 Starting weight: 191 lbs Starting date: 08/11/2019 Today's weight: 180 lbs Today's date: 11/28/2019 Total lbs lost to date: 11 lbs Total lbs lost since last in-office visit: 2 lbs  Interim History: Gavrielle's water intake is still poor.  She denies polyphagia and says she is getting enough protein.  She says she is eating handfuls of nuts at night when she takes her mediations (not calculated into calorie allowance).  Subjective:   1. Prediabetes Aracelly has a diagnosis of prediabetes based on her elevated HgA1c and was informed this puts her at greater risk of developing diabetes. She continues to work on diet and exercise to decrease her risk of diabetes. She denies nausea or hypoglycemia.  Metformin caused pain.  She cannot afford a GLP-1.    Lab Results  Component Value Date   HGBA1C 5.7 (H) 10/03/2019   Lab Results  Component Value Date   INSULIN 21.0 10/03/2019   2. Vitamin D deficiency Violetta's Vitamin D level was 28.6 on 10/03/2019. She is currently taking prescription vitamin D 50,000 IU each week. She denies nausea, vomiting or muscle weakness.  3. Pure hypercholesterolemia Niaya has hyperlipidemia and has been trying to improve her cholesterol levels with intensive lifestyle modification including a low saturated fat diet, exercise and weight loss. She denies any chest pain, claudication or myalgias.  She is taking Crestor.  Lab Results  Component Value Date   ALT 11 10/03/2019   AST 12 10/03/2019   ALKPHOS 98 10/03/2019   BILITOT 0.4 10/03/2019   Lab Results  Component Value Date   CHOL 183 10/03/2019   HDL 47  10/03/2019   LDLCALC 114 (H) 10/03/2019   TRIG 122 10/03/2019   Assessment/Plan:   1. Prediabetes Sareen will continue to work on weight loss, exercise, and decreasing simple carbohydrates to help decrease the risk of diabetes.  Versie will try intermittent fasting.  2. Vitamin D deficiency Low Vitamin D level contributes to fatigue and are associated with obesity, breast, and colon cancer. She agrees to continue to take prescription Vitamin D @50 ,000 IU every week and will follow-up for routine testing of Vitamin D, at least 2-3 times per year to avoid over-replacement.  3. Pure hypercholesterolemia Cardiovascular risk and specific lipid/LDL goals reviewed.  We discussed several lifestyle modifications today and Pakou will continue to work on diet, exercise and weight loss efforts. Orders and follow up as documented in patient record.   Counseling Intensive lifestyle modifications are the first line treatment for this issue. . Dietary changes: Increase soluble fiber. Decrease simple carbohydrates. . Exercise changes: Moderate to vigorous-intensity aerobic activity 150 minutes per week if tolerated. . Lipid-lowering medications: see documented in medical record.  4. Class 1 obesity with serious comorbidity and body mass index (BMI) of 33.0 to 33.9 in adult, unspecified obesity type Arta is currently in the action stage of change. As such, her goal is to continue with weight loss efforts. She has agreed to the Category 1 Plan with intermittent fasting 3 days a week.   Exercise goals: For substantial health benefits, adults should do at least 150 minutes (2  hours and 30 minutes) a week of moderate-intensity, or 75 minutes (1 hour and 15 minutes) a week of vigorous-intensity aerobic physical activity, or an equivalent combination of moderate- and vigorous-intensity aerobic activity. Aerobic activity should be performed in episodes of at least 10 minutes, and preferably, it should be spread  throughout the week.  Behavioral modification strategies: increasing water intake.  Maliaka has agreed to follow-up with our clinic in 2 weeks. She was informed of the importance of frequent follow-up visits to maximize her success with intensive lifestyle modifications for her multiple health conditions.   Objective:   Blood pressure 132/81, pulse 71, temperature 98.2 F (36.8 C), temperature source Oral, height 5\' 2"  (1.575 m), weight 180 lb (81.6 kg), SpO2 98 %. Body mass index is 32.92 kg/m.  General: Cooperative, alert, well developed, in no acute distress. HEENT: Conjunctivae and lids unremarkable. Cardiovascular: Regular rhythm.  Lungs: Normal work of breathing. Neurologic: No focal deficits.   Lab Results  Component Value Date   CREATININE 0.76 10/03/2019   BUN 19 10/03/2019   NA 141 10/03/2019   K 4.5 10/03/2019   CL 104 10/03/2019   CO2 23 10/03/2019   Lab Results  Component Value Date   ALT 11 10/03/2019   AST 12 10/03/2019   ALKPHOS 98 10/03/2019   BILITOT 0.4 10/03/2019   Lab Results  Component Value Date   HGBA1C 5.7 (H) 10/03/2019   HGBA1C 5.7 06/08/2019   Lab Results  Component Value Date   INSULIN 21.0 10/03/2019   Lab Results  Component Value Date   TSH 2.100 10/03/2019   Lab Results  Component Value Date   CHOL 183 10/03/2019   HDL 47 10/03/2019   LDLCALC 114 (H) 10/03/2019   TRIG 122 10/03/2019   Lab Results  Component Value Date   WBC 6.1 06/08/2019   HGB 13.6 06/08/2019   HCT 39 06/08/2019   MCV 87.8 09/02/2011   PLT 229 06/08/2019   Obesity Behavioral Intervention:   Approximately 15 minutes were spent on the discussion below.  ASK: We discussed the diagnosis of obesity with [name] today and Kevina agreed to give [place] permission to discuss obesity behavioral modification therapy today.  ASSESS: Nivriti has the diagnosis of obesity and her BMI today is 33.0. Khalyn is in the action stage of change.   ADVISE: Ivorie was educated on  the multiple health risks of obesity as well as the benefit of weight loss to improve her health. She was advised of the need for long term treatment and the importance of lifestyle modifications to improve her current health and to decrease her risk of future health problems.  AGREE: Multiple dietary modification options and treatment options were discussed and Kindel agreed to follow the recommendations documented in the above note.  ARRANGE: Mardy was educated on the importance of frequent visits to treat obesity as outlined per CMS and USPSTF guidelines and agreed to schedule her next follow up appointment today.  Attestation Statements:   Reviewed by clinician on day of visit: allergies, medications, problem list, medical history, surgical history, family history, social history, and previous encounter notes.  I, Water quality scientist, CMA, am acting as Location manager for [employer], DO.  I have reviewed the above documentation for accuracy and completeness, and I agree with the above. Briscoe Deutscher, DO

## 2019-12-21 ENCOUNTER — Encounter (INDEPENDENT_AMBULATORY_CARE_PROVIDER_SITE_OTHER): Payer: Self-pay | Admitting: Family Medicine

## 2019-12-21 ENCOUNTER — Ambulatory Visit (INDEPENDENT_AMBULATORY_CARE_PROVIDER_SITE_OTHER): Payer: Medicare Other | Admitting: Family Medicine

## 2019-12-21 ENCOUNTER — Other Ambulatory Visit: Payer: Self-pay

## 2019-12-21 VITALS — BP 136/75 | HR 75 | Temp 98.3°F | Ht 62.0 in | Wt 178.0 lb

## 2019-12-21 DIAGNOSIS — E559 Vitamin D deficiency, unspecified: Secondary | ICD-10-CM | POA: Diagnosis not present

## 2019-12-21 DIAGNOSIS — E7849 Other hyperlipidemia: Secondary | ICD-10-CM | POA: Diagnosis not present

## 2019-12-21 DIAGNOSIS — E669 Obesity, unspecified: Secondary | ICD-10-CM | POA: Diagnosis not present

## 2019-12-21 DIAGNOSIS — R7303 Prediabetes: Secondary | ICD-10-CM

## 2019-12-21 DIAGNOSIS — Z6832 Body mass index (BMI) 32.0-32.9, adult: Secondary | ICD-10-CM | POA: Diagnosis not present

## 2019-12-21 NOTE — Progress Notes (Signed)
Chief Complaint:   OBESITY Janice Pace is here to discuss her progress with her obesity treatment plan along with follow-up of her obesity related diagnoses. Janice Pace is on the Category 1 Plan and states she is following her eating plan approximately 20% of the time. Janice Pace states she is doing aerobics for 120 minutes 3-4 times per week.  Today's visit was #: 8 Starting weight: 191 lbs Starting date: 08/11/2019 Today's weight: 178 lbs Today's date: 12/21/2019 Total lbs lost to date: 13 lbs Total lbs lost since last in-office visit: 2 lbs  Interim History: Janice Pace has increased her intake of water and says she eats fruit for snacks.  She is making good choices, trying to remember to choose foods high in protein and vegetables. She controls her portion if eating carbohydrate or calorie-dense foods.  She reports that she is down another pant size!  Subjective:   1. Vitamin D deficiency Janice Pace's Vitamin D level was 28.6 on 10/03/2019. She is currently taking prescription vitamin D 50,000 IU each week. She denies nausea, vomiting or muscle weakness.  2. Prediabetes Janice Pace has a diagnosis of prediabetes based on her elevated HgA1c and was informed this puts her at greater risk of developing diabetes. She continues to work on diet and exercise to decrease her risk of diabetes. She denies nausea or hypoglycemia.  Lab Results  Component Value Date   HGBA1C 5.7 (H) 10/03/2019   Lab Results  Component Value Date   INSULIN 21.0 10/03/2019   3. Other hyperlipidemia Janice Pace has hyperlipidemia and has been trying to improve her cholesterol levels with intensive lifestyle modification including a low saturated fat diet, exercise and weight loss. She denies any chest pain, claudication or myalgias.  She is taking Crestor.  Lab Results  Component Value Date   ALT 11 10/03/2019   AST 12 10/03/2019   ALKPHOS 98 10/03/2019   BILITOT 0.4 10/03/2019   Lab Results  Component Value Date   CHOL 183 10/03/2019     HDL 47 10/03/2019   LDLCALC 114 (H) 10/03/2019   TRIG 122 10/03/2019   Assessment/Plan:   1. Vitamin D deficiency Low Vitamin D level contributes to fatigue and are associated with obesity, breast, and colon cancer. She agrees to continue to take prescription Vitamin D @50 ,000 IU every week and will follow-up for routine testing of Vitamin D, at least 2-3 times per year to avoid over-replacement.  2. Prediabetes Low Vitamin D level contributes to fatigue and are associated with obesity, breast, and colon cancer. She agrees to continue to take prescription Vitamin D @50 ,000 IU every week and will follow-up for routine testing of Vitamin D, at least 2-3 times per year to avoid over-replacement.  3. Other hyperlipidemia Cardiovascular risk and specific lipid/LDL goals reviewed.  We discussed several lifestyle modifications today and Janice Pace will continue to work on diet, exercise and weight loss efforts. Orders and follow up as documented in patient record.   Counseling Intensive lifestyle modifications are the first line treatment for this issue. . Dietary changes: Increase soluble fiber. Decrease simple carbohydrates. . Exercise changes: Moderate to vigorous-intensity aerobic activity 150 minutes per week if tolerated. . Lipid-lowering medications: see documented in medical record.  4. Class 1 obesity with serious comorbidity and body mass index (BMI) of 32.0 to 32.9 in adult, unspecified obesity type Janice Pace is currently in the action stage of change. As such, her goal is to continue with weight loss efforts. She has agreed to practicing portion control and making  smarter food choices, such as increasing vegetables and decreasing simple carbohydrates.   Exercise goals: As is.  Behavioral modification strategies: increasing lean protein intake and increasing high fiber foods.  Janice Pace has agreed to follow-up with our clinic in 3 weeks. She was informed of the importance of frequent follow-up  visits to maximize her success with intensive lifestyle modifications for her multiple health conditions.   Objective:   Blood pressure 136/75, pulse 75, temperature 98.3 F (36.8 C), temperature source Oral, height 5\' 2"  (1.575 m), weight 178 lb (80.7 kg), SpO2 98 %. Body mass index is 32.56 kg/m.  General: Cooperative, alert, well developed, in no acute distress. HEENT: Conjunctivae and lids unremarkable. Cardiovascular: Regular rhythm.  Lungs: Normal work of breathing. Neurologic: No focal deficits.   Lab Results  Component Value Date   CREATININE 0.76 10/03/2019   BUN 19 10/03/2019   NA 141 10/03/2019   K 4.5 10/03/2019   CL 104 10/03/2019   CO2 23 10/03/2019   Lab Results  Component Value Date   ALT 11 10/03/2019   AST 12 10/03/2019   ALKPHOS 98 10/03/2019   BILITOT 0.4 10/03/2019   Lab Results  Component Value Date   HGBA1C 5.7 (H) 10/03/2019   HGBA1C 5.7 06/08/2019   Lab Results  Component Value Date   INSULIN 21.0 10/03/2019   Lab Results  Component Value Date   TSH 2.100 10/03/2019   Lab Results  Component Value Date   CHOL 183 10/03/2019   HDL 47 10/03/2019   LDLCALC 114 (H) 10/03/2019   TRIG 122 10/03/2019   Lab Results  Component Value Date   WBC 6.1 06/08/2019   HGB 13.6 06/08/2019   HCT 39 06/08/2019   MCV 87.8 09/02/2011   PLT 229 06/08/2019   Obesity Behavioral Intervention:   Approximately 15 minutes were spent on the discussion below.  ASK: We discussed the diagnosis of obesity with Janice Pace today and Janice Pace agreed to give [place] permission to discuss obesity behavioral modification therapy today.  ASSESS: Janice Pace has the diagnosis of obesity and her BMI today is 32.6. Janice Pace is in the action stage of change.   ADVISE: Janice Pace was educated on the multiple health risks of obesity as well as the benefit of weight loss to improve her health. She was advised of the need for long term treatment and the importance of lifestyle modifications to  improve her current health and to decrease her risk of future health problems.  AGREE: Multiple dietary modification options and treatment options were discussed and Janice Pace agreed to follow the recommendations documented in the above note.  ARRANGE: Janice Pace was educated on the importance of frequent visits to treat obesity as outlined per CMS and USPSTF guidelines and agreed to schedule her next follow up appointment today.  Attestation Statements:   Reviewed by clinician on day of visit: allergies, medications, problem list, medical history, surgical history, family history, social history, and previous encounter notes.  I, Water quality scientist, CMA, am acting as transcriptionist for Briscoe Deutscher, DO  I have reviewed the above documentation for accuracy and completeness, and I agree with the above. Briscoe Deutscher, DO

## 2020-01-11 ENCOUNTER — Other Ambulatory Visit: Payer: Self-pay

## 2020-01-11 ENCOUNTER — Encounter (INDEPENDENT_AMBULATORY_CARE_PROVIDER_SITE_OTHER): Payer: Self-pay | Admitting: Family Medicine

## 2020-01-11 ENCOUNTER — Ambulatory Visit (INDEPENDENT_AMBULATORY_CARE_PROVIDER_SITE_OTHER): Payer: Medicare Other | Admitting: Family Medicine

## 2020-01-11 VITALS — BP 147/79 | HR 63 | Temp 98.1°F | Ht 62.0 in | Wt 180.0 lb

## 2020-01-11 DIAGNOSIS — Z6832 Body mass index (BMI) 32.0-32.9, adult: Secondary | ICD-10-CM

## 2020-01-11 DIAGNOSIS — R7303 Prediabetes: Secondary | ICD-10-CM | POA: Diagnosis not present

## 2020-01-11 DIAGNOSIS — E559 Vitamin D deficiency, unspecified: Secondary | ICD-10-CM

## 2020-01-11 DIAGNOSIS — E7849 Other hyperlipidemia: Secondary | ICD-10-CM

## 2020-01-11 DIAGNOSIS — R030 Elevated blood-pressure reading, without diagnosis of hypertension: Secondary | ICD-10-CM | POA: Diagnosis not present

## 2020-01-11 DIAGNOSIS — E669 Obesity, unspecified: Secondary | ICD-10-CM | POA: Diagnosis not present

## 2020-01-11 NOTE — Progress Notes (Signed)
Chief Complaint:   OBESITY Faria is here to discuss her progress with her obesity treatment plan along with follow-up of her obesity related diagnoses. Raelea is on the Category 1 Plan and states she is following her eating plan approximately 60% of the time. Mayme states she is doing water aerobics for 120 minutes 2 times per week.  Today's visit was #: 9 Starting weight: 191 lbs Starting date: 08/11/2019 Today's weight: 180 lbs Today's date: 01/11/2020 Total lbs lost to date: 11 lbs Total lbs lost since last in-office visit: 0  Interim History: Kathaleya says she has stopped exercising as much.  She reports that she is not paying much attention to foods and not planning.  She has been getting grab and go foods.  Subjective:   1. Prediabetes Yareth has a diagnosis of prediabetes based on her elevated HgA1c and was informed this puts her at greater risk of developing diabetes. She continues to work on diet and exercise to decrease her risk of diabetes. She denies nausea or hypoglycemia.  Lab Results  Component Value Date   HGBA1C 5.7 (H) 10/03/2019   Lab Results  Component Value Date   INSULIN 21.0 10/03/2019   2. Other hyperlipidemia Suzzane has hyperlipidemia and has been trying to improve her cholesterol levels with intensive lifestyle modification including a low saturated fat diet, exercise and weight loss. She denies any chest pain, claudication or myalgias.  Lab Results  Component Value Date   ALT 11 10/03/2019   AST 12 10/03/2019   ALKPHOS 98 10/03/2019   BILITOT 0.4 10/03/2019   Lab Results  Component Value Date   CHOL 183 10/03/2019   HDL 47 10/03/2019   LDLCALC 114 (H) 10/03/2019   TRIG 122 10/03/2019   3. Vitamin D deficiency Kadi's Vitamin D level was 28.6 on 10/03/2019. She is currently taking prescription vitamin D 50,000 IU each week. She denies nausea, vomiting or muscle weakness.  4. Elevated blood pressure reading Lonna's blood pressure is elevated  today.  She does not have the diagnosis of hypertensin.  BP Readings from Last 3 Encounters:  01/11/20 (!) 147/79  12/21/19 136/75  11/28/19 132/81   Assessment/Plan:   1. Prediabetes Branden will continue to work on weight loss, exercise, and decreasing simple carbohydrates to help decrease the risk of diabetes.   2. Other hyperlipidemia Cardiovascular risk and specific lipid/LDL goals reviewed.  We discussed several lifestyle modifications today and Mistina will continue to work on diet, exercise and weight loss efforts. Orders and follow up as documented in patient record.   Counseling Intensive lifestyle modifications are the first line treatment for this issue. . Dietary changes: Increase soluble fiber. Decrease simple carbohydrates. . Exercise changes: Moderate to vigorous-intensity aerobic activity 150 minutes per week if tolerated. . Lipid-lowering medications: see documented in medical record.  3. Vitamin D deficiency Low Vitamin D level contributes to fatigue and are associated with obesity, breast, and colon cancer. She agrees to continue to take prescription Vitamin D @50 ,000 IU every week and will follow-up for routine testing of Vitamin D, at least 2-3 times per year to avoid over-replacement.  4. Elevated blood pressure reading Will continue to monitor.  5. Class 1 obesity with serious comorbidity and body mass index (BMI) of 32.0 to 32.9 in adult, unspecified obesity type Matayah is currently in the action stage of change. As such, her goal is to continue with weight loss efforts. She has agreed to the Category 1 Plan.   Exercise  goals: For substantial health benefits, adults should do at least 150 minutes (2 hours and 30 minutes) a week of moderate-intensity, or 75 minutes (1 hour and 15 minutes) a week of vigorous-intensity aerobic physical activity, or an equivalent combination of moderate- and vigorous-intensity aerobic activity. Aerobic activity should be performed in  episodes of at least 10 minutes, and preferably, it should be spread throughout the week.  Behavioral modification strategies: increasing lean protein intake.  Karis has agreed to follow-up with our clinic in 3 weeks. She was informed of the importance of frequent follow-up visits to maximize her success with intensive lifestyle modifications for her multiple health conditions.   Objective:   Blood pressure (!) 147/79, pulse 63, temperature 98.1 F (36.7 C), temperature source Oral, height 5\' 2"  (1.575 m), weight 180 lb (81.6 kg), SpO2 97 %. Body mass index is 32.92 kg/m.  General: Cooperative, alert, well developed, in no acute distress. HEENT: Conjunctivae and lids unremarkable. Cardiovascular: Regular rhythm.  Lungs: Normal work of breathing. Neurologic: No focal deficits.   Lab Results  Component Value Date   CREATININE 0.76 10/03/2019   BUN 19 10/03/2019   NA 141 10/03/2019   K 4.5 10/03/2019   CL 104 10/03/2019   CO2 23 10/03/2019   Lab Results  Component Value Date   ALT 11 10/03/2019   AST 12 10/03/2019   ALKPHOS 98 10/03/2019   BILITOT 0.4 10/03/2019   Lab Results  Component Value Date   HGBA1C 5.7 (H) 10/03/2019   HGBA1C 5.7 06/08/2019   Lab Results  Component Value Date   INSULIN 21.0 10/03/2019   Lab Results  Component Value Date   TSH 2.100 10/03/2019   Lab Results  Component Value Date   CHOL 183 10/03/2019   HDL 47 10/03/2019   LDLCALC 114 (H) 10/03/2019   TRIG 122 10/03/2019   Lab Results  Component Value Date   WBC 6.1 06/08/2019   HGB 13.6 06/08/2019   HCT 39 06/08/2019   MCV 87.8 09/02/2011   PLT 229 06/08/2019   Obesity Behavioral Intervention:   Approximately 15 minutes were spent on the discussion below.  ASK: We discussed the diagnosis of obesity with [name] today and Jammi agreed to give [place] permission to discuss obesity behavioral modification therapy today.  ASSESS: Breezie has the diagnosis of obesity and her BMI today is  32.9. Kayleeann is in the action stage of change.   ADVISE: Kami was educated on the multiple health risks of obesity as well as the benefit of weight loss to improve her health. She was advised of the need for long term treatment and the importance of lifestyle modifications to improve her current health and to decrease her risk of future health problems.  AGREE: Multiple dietary modification options and treatment options were discussed and Yadira agreed to follow the recommendations documented in the above note.  ARRANGE: Rewa was educated on the importance of frequent visits to treat obesity as outlined per CMS and USPSTF guidelines and agreed to schedule her next follow up appointment today.  Attestation Statements:   Reviewed by clinician on day of visit: allergies, medications, problem list, medical history, surgical history, family history, social history, and previous encounter notes.  I, Water quality scientist, CMA, am acting as transcriptionist for Briscoe Deutscher, DO  I have reviewed the above documentation for accuracy and completeness, and I agree with the above. Briscoe Deutscher, DO

## 2020-01-11 NOTE — Telephone Encounter (Signed)
Please advise 

## 2020-01-25 ENCOUNTER — Encounter (INDEPENDENT_AMBULATORY_CARE_PROVIDER_SITE_OTHER): Payer: Self-pay | Admitting: Family Medicine

## 2020-01-25 ENCOUNTER — Ambulatory Visit (INDEPENDENT_AMBULATORY_CARE_PROVIDER_SITE_OTHER): Payer: Medicare Other | Admitting: Family Medicine

## 2020-01-25 ENCOUNTER — Other Ambulatory Visit: Payer: Self-pay

## 2020-01-25 VITALS — BP 152/82 | HR 69 | Temp 98.2°F | Ht 62.0 in | Wt 180.0 lb

## 2020-01-25 DIAGNOSIS — Z6833 Body mass index (BMI) 33.0-33.9, adult: Secondary | ICD-10-CM | POA: Diagnosis not present

## 2020-01-25 DIAGNOSIS — R030 Elevated blood-pressure reading, without diagnosis of hypertension: Secondary | ICD-10-CM | POA: Diagnosis not present

## 2020-01-25 DIAGNOSIS — E669 Obesity, unspecified: Secondary | ICD-10-CM | POA: Diagnosis not present

## 2020-01-25 DIAGNOSIS — E559 Vitamin D deficiency, unspecified: Secondary | ICD-10-CM | POA: Diagnosis not present

## 2020-01-25 MED ORDER — VITAMIN D (ERGOCALCIFEROL) 1.25 MG (50000 UNIT) PO CAPS: 50000.0000 [IU] | ORAL_CAPSULE | ORAL | 0 refills | Status: DC

## 2020-01-26 NOTE — Progress Notes (Signed)
Chief Complaint:   OBESITY Janice Pace is here to discuss her progress with her obesity treatment plan along with follow-up of her obesity related diagnoses. Janice Pace is on the Category 1 Plan and states she is following her eating plan approximately 50% of the time. Janice Pace states she is doing water aerobics for 120 minutes 3 times per week.  Today's visit was #: 10 Starting weight: 191 lbs Starting date: 08/11/2019 Today's weight: 180 lbs Today's date: 01/25/2020 Total lbs lost to date: 11 Total lbs lost since last in-office visit: 0  Interim History: Arrie notes she has been quite busy and grabbing food to eat rather than planning. She has not always had times to cook or meal plan, and her protein intake is down. She may go on a month long motorcycle trip out [place], and will leave August 16th.  Subjective:   1. Elevated blood-pressure reading, without diagnosis of hypertension Janice Pace has had 2 elevated blood pressures. She is not diagnosed with HTN and is not taking medication.   BP Readings from Last 3 Encounters:  01/25/20 (!) 152/82  01/11/20 (!) 147/79  12/21/19 136/75   Lab Results  Component Value Date   CREATININE 0.76 10/03/2019   CREATININE 0.7 06/08/2019   CREATININE 0.57 09/02/2011   2. Vitamin D deficiency Janice Pace's last Vit D level was low at 28.6. she is on weekly prescription Vit D.  Assessment/Plan:   1. Elevated blood-pressure reading, without diagnosis of hypertension Janice Pace is working on healthy weight loss and exercise to improve blood pressure control.  We will continue to monitor. I told her we may need to add medication.  2. Vitamin D deficiency Low Vitamin D level contributes to fatigue and are associated with obesity, breast, and colon cancer. We will refill prescription Vitamin D for 1 month. Janice Pace will follow-up for routine testing of Vitamin D, at least 2-3 times per year to avoid over-replacement.  - Vitamin D, Ergocalciferol, (DRISDOL) 1.25 MG (50000  UNIT) CAPS capsule; Take 1 capsule (50,000 Units total) by mouth every 7 (seven) days.  Dispense: 4 capsule; Refill: 0  3. Class 1 obesity with serious comorbidity and body mass index (BMI) of 33.0 to 33.9 in adult, unspecified obesity type Janice Pace is currently in the action stage of change. As such, her goal is to continue with weight loss efforts. She has agreed to the Category 1 Plan or keeping a food journal and adhering to recommended goals of 1000 calories and 75 grams of protein daily.   We discussed packing lunch. Handout given today: 100 Calorie Snacks.  Exercise goals: As is.  Behavioral modification strategies: increasing lean protein intake, meal planning and cooking strategies and travel eating strategies.  Janice Pace has agreed to follow-up with our clinic in 3 weeks. She was informed of the importance of frequent follow-up visits to maximize her success with intensive lifestyle modifications for her multiple health conditions.   Objective:   Blood pressure (!) 152/82, pulse 69, temperature 98.2 F (36.8 C), temperature source Oral, height 5\' 2"  (1.575 m), weight 180 lb (81.6 kg), SpO2 96 %. Body mass index is 32.92 kg/m.  General: Cooperative, alert, well developed, in no acute distress. HEENT: Conjunctivae and lids unremarkable. Cardiovascular: Regular rhythm.  Lungs: Normal work of breathing. Neurologic: No focal deficits.   Lab Results  Component Value Date   CREATININE 0.76 10/03/2019   BUN 19 10/03/2019   NA 141 10/03/2019   K 4.5 10/03/2019   CL 104 10/03/2019  CO2 23 10/03/2019   Lab Results  Component Value Date   ALT 11 10/03/2019   AST 12 10/03/2019   ALKPHOS 98 10/03/2019   BILITOT 0.4 10/03/2019   Lab Results  Component Value Date   HGBA1C 5.7 (H) 10/03/2019   HGBA1C 5.7 06/08/2019   Lab Results  Component Value Date   INSULIN 21.0 10/03/2019   Lab Results  Component Value Date   TSH 2.100 10/03/2019   Lab Results  Component Value Date    CHOL 183 10/03/2019   HDL 47 10/03/2019   LDLCALC 114 (H) 10/03/2019   TRIG 122 10/03/2019   Lab Results  Component Value Date   WBC 6.1 06/08/2019   HGB 13.6 06/08/2019   HCT 39 06/08/2019   MCV 87.8 09/02/2011   PLT 229 06/08/2019   No results found for: IRON, TIBC, FERRITIN  Attestation Statements:   Reviewed by clinician on day of visit: allergies, medications, problem list, medical history, surgical history, family history, social history, and previous encounter notes.   Wilhemena Durie, am acting as Location manager for [employer], FNP-C.  I have reviewed the above documentation for accuracy and completeness, and I agree with the above. -  Georgianne Fick, FNP

## 2020-01-27 ENCOUNTER — Encounter (INDEPENDENT_AMBULATORY_CARE_PROVIDER_SITE_OTHER): Payer: Self-pay | Admitting: Family Medicine

## 2020-01-27 DIAGNOSIS — R030 Elevated blood-pressure reading, without diagnosis of hypertension: Secondary | ICD-10-CM | POA: Insufficient documentation

## 2020-02-15 ENCOUNTER — Other Ambulatory Visit: Payer: Self-pay

## 2020-02-15 ENCOUNTER — Ambulatory Visit (INDEPENDENT_AMBULATORY_CARE_PROVIDER_SITE_OTHER): Payer: Medicare Other | Admitting: Family Medicine

## 2020-02-15 ENCOUNTER — Encounter (INDEPENDENT_AMBULATORY_CARE_PROVIDER_SITE_OTHER): Payer: Self-pay | Admitting: Family Medicine

## 2020-02-15 VITALS — BP 137/75 | HR 64 | Temp 98.2°F | Ht 62.0 in | Wt 178.0 lb

## 2020-02-15 DIAGNOSIS — R030 Elevated blood-pressure reading, without diagnosis of hypertension: Secondary | ICD-10-CM

## 2020-02-15 DIAGNOSIS — Z6832 Body mass index (BMI) 32.0-32.9, adult: Secondary | ICD-10-CM

## 2020-02-15 DIAGNOSIS — E7849 Other hyperlipidemia: Secondary | ICD-10-CM

## 2020-02-15 DIAGNOSIS — E559 Vitamin D deficiency, unspecified: Secondary | ICD-10-CM

## 2020-02-15 DIAGNOSIS — E669 Obesity, unspecified: Secondary | ICD-10-CM

## 2020-02-15 NOTE — Progress Notes (Signed)
Chief Complaint:   OBESITY Janice Pace is here to discuss her progress with her obesity treatment plan along with follow-up of her obesity related diagnoses. Janice Pace is on the Category 1 Plan and states she is following her eating plan approximately 40% of the time. [name] states she is doing water aerobics for 60 minutes 2 times per week.  Today's visit was #: 11 Starting weight: 191 lbs Starting date: 08/11/2019 Today's weight: 178 lbs Today's date: 02/15/2020 Total lbs lost to date: 13 lbs Total lbs lost since last in-office visit: 2 lbs Total weight loss percentage to date: 6.81%  Interim History: Janice Pace's blood pressure is normal today.  She says he is still enjoying summer vegetables such as fried squash, etc.  She uses olive oil to fry, but is careful with the amount.  Subjective:   1. Elevated BP without diagnosis of hypertension Not optimized, but improving. We will continue to monitor at this time. Recommended sodium restriction.  BP Readings from Last 3 Encounters:  02/15/20 137/75  01/25/20 (!) 152/82  01/11/20 (!) 147/79   2. Vitamin D deficiency Janice Pace's Vitamin D level was 28.6 on 10/03/2019. She is currently taking prescription vitamin D 50,000 IU each week. She denies nausea, vomiting or muscle weakness.  3. Other hyperlipidemia She denies any chest pain, claudication or myalgias.  She is taking Crestor.  Lab Results  Component Value Date   ALT 11 10/03/2019   AST 12 10/03/2019   ALKPHOS 98 10/03/2019   BILITOT 0.4 10/03/2019   Lab Results  Component Value Date   CHOL 183 10/03/2019   HDL 47 10/03/2019   LDLCALC 114 (H) 10/03/2019   TRIG 122 10/03/2019   Assessment/Plan:   1. Elevated BP without diagnosis of hypertension Brittin is working on healthy weight loss and exercise to improve blood pressure control. We will watch for signs of hypotension as she continues her lifestyle modifications.  Will continue to monitor.  2. Vitamin D deficiency Not at goal.  Goal is > 50. Low Vitamin D level contributes to fatigue and are associated with obesity, breast, and colon cancer. She agrees to continue to take prescription Vitamin D @50 ,000 IU every week and will follow-up for routine testing of Vitamin D, at least 2-3 times per year to avoid over-replacement.  3. Other hyperlipidemia Not optimized. Cardiovascular risk and specific lipid/LDL goals reviewed.  We discussed several lifestyle modifications today and Janice Pace will continue to work on diet, exercise and weight loss efforts. Orders and follow up as documented in patient record.   4. Class 1 obesity with serious comorbidity and body mass index (BMI) of 32.0 to 32.9 in adult, unspecified obesity type Janice Pace is currently in the action stage of change. As such, her goal is to continue with weight loss efforts. She has agreed to the Category 1 Plan.   Exercise goals: For substantial health benefits, adults should do at least 150 minutes (2 hours and 30 minutes) a week of moderate-intensity, or 75 minutes (1 hour and 15 minutes) a week of vigorous-intensity aerobic physical activity, or an equivalent combination of moderate- and vigorous-intensity aerobic activity. Aerobic activity should be performed in episodes of at least 10 minutes, and preferably, it should be spread throughout the week.  Behavioral modification strategies: increasing lean protein intake.  Janice Pace has agreed to follow-up with our clinic in 3-4 weeks. She was informed of the importance of frequent follow-up visits to maximize her success with intensive lifestyle modifications for her multiple health conditions.  Objective:   Blood pressure 137/75, pulse 64, temperature 98.2 F (36.8 C), temperature source Oral, height 5\' 2"  (1.575 m), weight 178 lb (80.7 kg), SpO2 98 %. Body mass index is 32.56 kg/m.  General: Cooperative, alert, well developed, in no acute distress. HEENT: Conjunctivae and lids unremarkable. Cardiovascular: Regular  rhythm.  Lungs: Normal work of breathing. Neurologic: No focal deficits.   Lab Results  Component Value Date   CREATININE 0.76 10/03/2019   BUN 19 10/03/2019   NA 141 10/03/2019   K 4.5 10/03/2019   CL 104 10/03/2019   CO2 23 10/03/2019   Lab Results  Component Value Date   ALT 11 10/03/2019   AST 12 10/03/2019   ALKPHOS 98 10/03/2019   BILITOT 0.4 10/03/2019   Lab Results  Component Value Date   HGBA1C 5.7 (H) 10/03/2019   HGBA1C 5.7 06/08/2019   Lab Results  Component Value Date   INSULIN 21.0 10/03/2019   Lab Results  Component Value Date   TSH 2.100 10/03/2019   Lab Results  Component Value Date   CHOL 183 10/03/2019   HDL 47 10/03/2019   LDLCALC 114 (H) 10/03/2019   TRIG 122 10/03/2019   Lab Results  Component Value Date   WBC 6.1 06/08/2019   HGB 13.6 06/08/2019   HCT 39 06/08/2019   MCV 87.8 09/02/2011   PLT 229 06/08/2019   Obesity Behavioral Intervention:   Approximately 15 minutes were spent on the discussion below.  ASK: We discussed the diagnosis of obesity with Janice Pace today and Janice Pace agreed to give [place] permission to discuss obesity behavioral modification therapy today.  ASSESS: Janice Pace has the diagnosis of obesity and her BMI today is 32.7. Janice Pace is in the action stage of change.   ADVISE: Janice Pace was educated on the multiple health risks of obesity as well as the benefit of weight loss to improve her health. She was advised of the need for long term treatment and the importance of lifestyle modifications to improve her current health and to decrease her risk of future health problems.  AGREE: Multiple dietary modification options and treatment options were discussed and Janice Pace agreed to follow the recommendations documented in the above note.  ARRANGE: Janice Pace was educated on the importance of frequent visits to treat obesity as outlined per CMS and USPSTF guidelines and agreed to schedule her next follow up appointment today.  Attestation  Statements:   Reviewed by clinician on day of visit: allergies, medications, problem list, medical history, surgical history, family history, social history, and previous encounter notes.  I, Water quality scientist, CMA, am acting as transcriptionist for Briscoe Deutscher, DO  I have reviewed the above documentation for accuracy and completeness, and I agree with the above. Briscoe Deutscher, DO

## 2020-04-16 ENCOUNTER — Other Ambulatory Visit (INDEPENDENT_AMBULATORY_CARE_PROVIDER_SITE_OTHER): Payer: Self-pay | Admitting: Family Medicine

## 2020-04-16 DIAGNOSIS — E559 Vitamin D deficiency, unspecified: Secondary | ICD-10-CM

## 2020-04-17 MED ORDER — VITAMIN D (ERGOCALCIFEROL) 1.25 MG (50000 UNIT) PO CAPS: 50000.0000 [IU] | ORAL_CAPSULE | ORAL | 0 refills | Status: DC

## 2020-04-24 ENCOUNTER — Other Ambulatory Visit: Payer: Self-pay

## 2020-04-24 ENCOUNTER — Encounter (INDEPENDENT_AMBULATORY_CARE_PROVIDER_SITE_OTHER): Payer: Self-pay | Admitting: Family Medicine

## 2020-04-24 ENCOUNTER — Ambulatory Visit (INDEPENDENT_AMBULATORY_CARE_PROVIDER_SITE_OTHER): Payer: Medicare Other | Admitting: Family Medicine

## 2020-04-24 VITALS — BP 131/81 | HR 69 | Temp 98.8°F | Ht 62.0 in | Wt 178.0 lb

## 2020-04-24 DIAGNOSIS — E8881 Metabolic syndrome: Secondary | ICD-10-CM

## 2020-04-24 DIAGNOSIS — E669 Obesity, unspecified: Secondary | ICD-10-CM

## 2020-04-24 DIAGNOSIS — Z6832 Body mass index (BMI) 32.0-32.9, adult: Secondary | ICD-10-CM | POA: Diagnosis not present

## 2020-04-24 DIAGNOSIS — E65 Localized adiposity: Secondary | ICD-10-CM

## 2020-04-24 DIAGNOSIS — E559 Vitamin D deficiency, unspecified: Secondary | ICD-10-CM | POA: Diagnosis not present

## 2020-04-24 DIAGNOSIS — D649 Anemia, unspecified: Secondary | ICD-10-CM

## 2020-04-24 DIAGNOSIS — E66811 Obesity, class 1: Secondary | ICD-10-CM

## 2020-04-24 DIAGNOSIS — R7303 Prediabetes: Secondary | ICD-10-CM | POA: Diagnosis not present

## 2020-04-25 LAB — LIPID PANEL WITH LDL/HDL RATIO
Cholesterol, Total: 162 mg/dL (ref 100–199)
HDL: 47 mg/dL (ref >39)
LDL Chol Calc (NIH): 87 mg/dL (ref 0–99)
LDL/HDL Ratio: 1.9 ratio (ref 0.0–3.2)
Triglycerides: 161 mg/dL — ABNORMAL HIGH (ref 0–149)
VLDL Cholesterol Cal: 28 mg/dL (ref 5–40)

## 2020-04-25 LAB — COMPREHENSIVE METABOLIC PANEL
ALT: 13 IU/L (ref 0–32)
AST: 14 IU/L (ref 0–40)
Albumin/Globulin Ratio: 2.1 (ref 1.2–2.2)
Albumin: 4.5 g/dL (ref 3.7–4.7)
Alkaline Phosphatase: 94 IU/L (ref 44–121)
BUN/Creatinine Ratio: 18 (ref 12–28)
BUN: 12 mg/dL (ref 8–27)
Bilirubin Total: 0.6 mg/dL (ref 0.0–1.2)
CO2: 24 mmol/L (ref 20–29)
Calcium: 9.4 mg/dL (ref 8.7–10.3)
Chloride: 105 mmol/L (ref 96–106)
Creatinine, Ser: 0.68 mg/dL (ref 0.57–1.00)
GFR calc Af Amer: 100 mL/min/{1.73_m2} (ref >59)
GFR calc non Af Amer: 87 mL/min/{1.73_m2} (ref >59)
Globulin, Total: 2.1 g/dL (ref 1.5–4.5)
Glucose: 95 mg/dL (ref 65–99)
Potassium: 4.3 mmol/L (ref 3.5–5.2)
Sodium: 142 mmol/L (ref 134–144)
Total Protein: 6.6 g/dL (ref 6.0–8.5)

## 2020-04-25 LAB — CBC WITH DIFFERENTIAL/PLATELET
Basophils Absolute: 0 10*3/uL (ref 0.0–0.2)
Basos: 1 %
EOS (ABSOLUTE): 0.1 10*3/uL (ref 0.0–0.4)
Eos: 2 %
Hematocrit: 41.9 % (ref 34.0–46.6)
Hemoglobin: 14.1 g/dL (ref 11.1–15.9)
Immature Grans (Abs): 0 10*3/uL (ref 0.0–0.1)
Immature Granulocytes: 0 %
Lymphocytes Absolute: 2.2 10*3/uL (ref 0.7–3.1)
Lymphs: 36 %
MCH: 30.5 pg (ref 26.6–33.0)
MCHC: 33.7 g/dL (ref 31.5–35.7)
MCV: 91 fL (ref 79–97)
Monocytes Absolute: 0.4 10*3/uL (ref 0.1–0.9)
Monocytes: 7 %
Neutrophils Absolute: 3.3 10*3/uL (ref 1.4–7.0)
Neutrophils: 54 %
Platelets: 211 10*3/uL (ref 150–450)
RBC: 4.63 x10E6/uL (ref 3.77–5.28)
RDW: 13.2 % (ref 11.7–15.4)
WBC: 6 10*3/uL (ref 3.4–10.8)

## 2020-04-25 LAB — HEMOGLOBIN A1C
Est. average glucose Bld gHb Est-mCnc: 123 mg/dL
Hgb A1c MFr Bld: 5.9 % — ABNORMAL HIGH (ref 4.8–5.6)

## 2020-04-25 LAB — VITAMIN D 25 HYDROXY (VIT D DEFICIENCY, FRACTURES): Vit D, 25-Hydroxy: 57.5 ng/mL (ref 30.0–100.0)

## 2020-04-25 LAB — INSULIN, RANDOM: INSULIN: 15 u[IU]/mL (ref 2.6–24.9)

## 2020-04-26 NOTE — Progress Notes (Signed)
Chief Complaint:   OBESITY Janice Pace is here to discuss her progress with her obesity treatment plan along with follow-up of her obesity related diagnoses. Janice Pace is on the Category 1 Plan and states she is following her eating plan approximately 50% of the time. Janice Pace states she is doing water aerobics for 120 minutes 1-2 times per week.  Today's visit was #: 12 Starting weight: 191 lbs Starting date: 08/11/2019 Today's weight: 178 lbs Today's date: 04/24/2020 Total lbs lost to date: 13 lbs Total lbs lost since last in-office visit: 0 Total weight loss percentage to date: -6.81%  Interim History: Janice Pace says she enjoyed her vacation (motorcycle road trip).  She is happy that she maintained her weight.  Assessment/Plan:   1. Vitamin D deficiency Current vitamin D is 28.6, tested on 10/03/2019. Not at goal. Optimal goal > 50 ng/dL.   Plan: Continue Vitamin D @50 ,000 IU every week with follow-up for routine testing of Vitamin D at least 2-3 times per year to avoid over-replacement.  Will check vitamin D level today.  - VITAMIN D 25 Hydroxy (Vit-D Deficiency, Fractures)  2. Anemia Nutrition: Iron-rich foods include dark leafy greens, red and white meats, eggs, seafood, and beans.  Certain foods and drinks prevent your body from absorbing iron properly. Avoid eating these foods in the same meal as iron-rich foods or with iron supplements. These foods include: coffee, black tea, and red wine; milk, dairy products, and foods that are high in calcium; beans and soybeans; whole grains. Constipation can be a side effect of iron supplementation. Increased water and fiber intake are helpful. Water goal: > 2 liters/day. Fiber goal: > 25 grams/day.  - CBC with Differential/Platelet - Comprehensive metabolic panel  3. Prediabetes Not at goal. Goal is HgbA1c < 5.7 and insulin level closer to 5. She will continue to focus on protein-rich, low simple carbohydrate foods. We reviewed the importance of  hydration, regular exercise for stress reduction, and restorative sleep.   Lab Results  Component Value Date   HGBA1C 5.9 (H) 04/24/2020   Lab Results  Component Value Date   INSULIN 15.0 04/24/2020   INSULIN 21.0 10/03/2019   - Comprehensive metabolic panel - Hemoglobin A1c - Insulin, random  4. Visceral obesity Improving.  Current visceral fat rating: 13. Visceral adipose tissue is a hormonally active component of total body fat. This body composition phenotype is associated with medical disorders such as metabolic syndrome, cardiovascular disease and several malignancies including prostate, breast, and colorectal cancers. Goal: Lose 7-10% of starting weight. Visceral fat rating should be < 13.  - Lipid Panel With LDL/HDL Ratio  5. Metabolic syndrome Starting goal: Lose 7-10% of starting weight. She will continue to focus on protein-rich, low simple carbohydrate foods. We reviewed the importance of hydration, regular exercise for stress reduction, and restorative sleep.  We will continue to check lab work every 3 months, with 10% weight loss, or should any other concerns arise.  - Lipid Panel With LDL/HDL Ratio  6. Class 1 obesity with serious comorbidity and body mass index (BMI) of 32.0 to 32.9 in adult, unspecified obesity type  Janice Pace is currently in the action stage of change. As such, her goal is to continue with weight loss efforts. She has agreed to the Category 1 Plan.   Exercise goals: Increase the number of days per week of exercise.  Behavioral modification strategies: increasing lean protein intake, decreasing simple carbohydrates and increasing vegetables.  Janice Pace has agreed to follow-up with our  clinic in 3-4 weeks. She was informed of the importance of frequent follow-up visits to maximize her success with intensive lifestyle modifications for her multiple health conditions.   Objective:   Blood pressure 131/81, pulse 69, temperature 98.8 F (37.1 C), temperature  source Oral, height 5\' 2"  (1.575 m), weight 178 lb (80.7 kg), SpO2 97 %. Body mass index is 32.56 kg/m.  General: Cooperative, alert, well developed, in no acute distress. HEENT: Conjunctivae and lids unremarkable. Cardiovascular: Regular rhythm.  Lungs: Normal work of breathing. Neurologic: No focal deficits.   Lab Results  Component Value Date   CREATININE 0.68 04/24/2020   BUN 12 04/24/2020   NA 142 04/24/2020   K 4.3 04/24/2020   CL 105 04/24/2020   CO2 24 04/24/2020   Lab Results  Component Value Date   ALT 13 04/24/2020   AST 14 04/24/2020   ALKPHOS 94 04/24/2020   BILITOT 0.6 04/24/2020   Lab Results  Component Value Date   HGBA1C 5.9 (H) 04/24/2020   HGBA1C 5.7 (H) 10/03/2019   HGBA1C 5.7 06/08/2019   Lab Results  Component Value Date   INSULIN 15.0 04/24/2020   INSULIN 21.0 10/03/2019   Lab Results  Component Value Date   TSH 2.100 10/03/2019   Lab Results  Component Value Date   CHOL 162 04/24/2020   HDL 47 04/24/2020   LDLCALC 87 04/24/2020   TRIG 161 (H) 04/24/2020   Lab Results  Component Value Date   WBC 6.0 04/24/2020   HGB 14.1 04/24/2020   HCT 41.9 04/24/2020   MCV 91 04/24/2020   PLT 211 04/24/2020   Obesity Behavioral Intervention:   Approximately 15 minutes were spent on the discussion below.  ASK: We discussed the diagnosis of obesity with Janice Pace today and Janice Pace agreed to give [place] permission to discuss obesity behavioral modification therapy today.  ASSESS: Janice Pace has the diagnosis of obesity and her BMI today is 32.6. Janice Pace is in the action stage of change.   ADVISE: Janice Pace was educated on the multiple health risks of obesity as well as the benefit of weight loss to improve her health. She was advised of the need for long term treatment and the importance of lifestyle modifications to improve her current health and to decrease her risk of future health problems.  AGREE: Multiple dietary modification options and treatment options  were discussed and Janice Pace agreed to follow the recommendations documented in the above note.  ARRANGE: Janice Pace was educated on the importance of frequent visits to treat obesity as outlined per CMS and USPSTF guidelines and agreed to schedule her next follow up appointment today.  Attestation Statements:   Reviewed by clinician on day of visit: allergies, medications, problem list, medical history, surgical history, family history, social history, and previous encounter notes.  I, Water quality scientist, CMA, am acting as transcriptionist for Briscoe Deutscher, DO  I have reviewed the above documentation for accuracy and completeness, and I agree with the above. Briscoe Deutscher, DO

## 2020-05-22 ENCOUNTER — Other Ambulatory Visit: Payer: Self-pay

## 2020-05-22 ENCOUNTER — Ambulatory Visit (INDEPENDENT_AMBULATORY_CARE_PROVIDER_SITE_OTHER): Payer: Medicare Other | Admitting: Family Medicine

## 2020-05-22 ENCOUNTER — Encounter (INDEPENDENT_AMBULATORY_CARE_PROVIDER_SITE_OTHER): Payer: Self-pay | Admitting: Family Medicine

## 2020-05-22 VITALS — BP 143/75 | HR 86 | Temp 98.6°F | Ht 62.0 in | Wt 182.0 lb

## 2020-05-22 DIAGNOSIS — R7303 Prediabetes: Secondary | ICD-10-CM

## 2020-05-22 DIAGNOSIS — E669 Obesity, unspecified: Secondary | ICD-10-CM

## 2020-05-22 DIAGNOSIS — Z6833 Body mass index (BMI) 33.0-33.9, adult: Secondary | ICD-10-CM

## 2020-05-22 DIAGNOSIS — E781 Pure hyperglyceridemia: Secondary | ICD-10-CM | POA: Diagnosis not present

## 2020-05-22 DIAGNOSIS — E559 Vitamin D deficiency, unspecified: Secondary | ICD-10-CM | POA: Diagnosis not present

## 2020-05-22 MED ORDER — VITAMIN D (ERGOCALCIFEROL) 1.25 MG (50000 UNIT) PO CAPS: 50000.0000 [IU] | ORAL_CAPSULE | ORAL | 0 refills | Status: DC

## 2020-05-24 NOTE — Progress Notes (Signed)
Chief Complaint:   OBESITY Janice Pace is here to discuss her progress with her obesity treatment plan along with follow-up of her obesity related diagnoses.   Today's visit was #: 13 Starting weight: 191 lbs Starting date: 08/11/2019 Today's weight: 182 lbs Today's date: 05/22/2020 Total lbs lost to date: 9 lbs Body mass index is 33.29 kg/m.  Total weight loss percentage to date: -4.71%  Nutrition Plan: the Category 1 Plan for 20% of the time.  Hunger is moderately controlled controlled. Cravings are moderately controlled controlled.  Activity: None. Suda is taking care of her great grandson a few days a week.  She says that the increase in cold weather has increased her pain from OA. Sleep: Sleep is restful.   Assessment/Plan:   1. Vitamin D deficiency At goal. Current vitamin D is 57.5, tested on 04/24/2020. Optimal goal > 50 ng/dL.   Plan:  [x]   Continue Vitamin D @50 ,000 IU every week. []   Continue home supplement daily. [x]   Follow-up for routine testing of Vitamin D at least 2-3 times per year to avoid over-replacement.  - Refill Vitamin D, Ergocalciferol, (DRISDOL) 1.25 MG (50000 UNIT) CAPS capsule; Take 1 capsule (50,000 Units total) by mouth every 14 (fourteen) days.  Dispense: 2 capsule; Refill: 0  2. Prediabetes Not optimized. Goal is HgbA1c < 5.7.  She will continue to focus on protein-rich, low simple carbohydrate foods. We reviewed the importance of hydration, regular exercise for stress reduction, and restorative sleep.   Lab Results  Component Value Date   HGBA1C 5.9 (H) 04/24/2020   Lab Results  Component Value Date   INSULIN 15.0 04/24/2020   INSULIN 21.0 10/03/2019   3. Hypertriglyceridemia Course: Not at goal. Medication: Crestor.  Plan: Dietary changes: Increase soluble fiber. Decrease simple carbohydrates. Exercise changes: An average 40 minutes of moderate to vigorous-intensity aerobic activity 3 or 4 times per week.   Lab Results  Component  Value Date   CHOL 162 04/24/2020   HDL 47 04/24/2020   LDLCALC 87 04/24/2020   TRIG 161 (H) 04/24/2020   Lab Results  Component Value Date   ALT 13 04/24/2020   AST 14 04/24/2020   ALKPHOS 94 04/24/2020   BILITOT 0.6 04/24/2020   4. Class 1 obesity with serious comorbidity and body mass index (BMI) of 33.0 to 33.9 in adult, unspecified obesity type  Course: [name] is currently in the action stage of change. As such, her goal is to continue with weight loss efforts.   Nutrition goals: She has agreed to the Category 1 Plan.   Exercise goals: All adults should avoid inactivity. Some physical activity is better than none, and adults who participate in any amount of physical activity gain some health benefits.  Behavioral modification strategies: increasing lean protein intake, decreasing simple carbohydrates, increasing vegetables, increasing water intake, decreasing liquid calories and dealing with family or coworker sabotage.  [name] has agreed to follow-up with our clinic in 3 weeks. She was informed of the importance of frequent follow-up visits to maximize her success with intensive lifestyle modifications for her multiple health conditions.   Objective:   Blood pressure (!) 143/75, pulse 86, temperature 98.6 F (37 C), temperature source Oral, height 5\' 2"  (1.575 m), weight 182 lb (82.6 kg), SpO2 97 %. Body mass index is 33.29 kg/m.  General: Cooperative, alert, well developed, in no acute distress. HEENT: Conjunctivae and lids unremarkable. Cardiovascular: Regular rhythm.  Lungs: Normal work of breathing. Neurologic: No focal deficits.  Lab Results  Component Value Date   CREATININE 0.68 04/24/2020   BUN 12 04/24/2020   NA 142 04/24/2020   K 4.3 04/24/2020   CL 105 04/24/2020   CO2 24 04/24/2020   Lab Results  Component Value Date   ALT 13 04/24/2020   AST 14 04/24/2020   ALKPHOS 94 04/24/2020   BILITOT 0.6 04/24/2020   Lab Results  Component Value Date    HGBA1C 5.9 (H) 04/24/2020   HGBA1C 5.7 (H) 10/03/2019   HGBA1C 5.7 06/08/2019   Lab Results  Component Value Date   INSULIN 15.0 04/24/2020   INSULIN 21.0 10/03/2019   Lab Results  Component Value Date   TSH 2.100 10/03/2019   Lab Results  Component Value Date   CHOL 162 04/24/2020   HDL 47 04/24/2020   LDLCALC 87 04/24/2020   TRIG 161 (H) 04/24/2020   Lab Results  Component Value Date   WBC 6.0 04/24/2020   HGB 14.1 04/24/2020   HCT 41.9 04/24/2020   MCV 91 04/24/2020   PLT 211 04/24/2020   Obesity Behavioral Intervention:   Approximately 15 minutes were spent on the discussion below.  ASK: We discussed the diagnosis of obesity with [name] today and Sherlyne agreed to give [place] permission to discuss obesity behavioral modification therapy today.  ASSESS: Julien has the diagnosis of obesity and her BMI today is 33.4. [place] is in the action stage of change.   ADVISE: Albert was educated on the multiple health risks of obesity as well as the benefit of weight loss to improve her health. She was advised of the need for long term treatment and the importance of lifestyle modifications to improve her current health and to decrease her risk of future health problems.  AGREE: Multiple dietary modification options and treatment options were discussed and Airel agreed to follow the recommendations documented in the above note.  ARRANGE: Kaitlyn was educated on the importance of frequent visits to treat obesity as outlined per CMS and USPSTF guidelines and agreed to schedule her next follow up appointment today.  Attestation Statements:   Reviewed by clinician on day of visit: allergies, medications, problem list, medical history, surgical history, family history, social history, and previous encounter notes.  I, Water quality scientist, CMA, am acting as transcriptionist for Briscoe Deutscher, DO  I have reviewed the above documentation for accuracy and completeness, and I agree with the above. Briscoe Deutscher, DO

## 2020-06-19 ENCOUNTER — Ambulatory Visit (INDEPENDENT_AMBULATORY_CARE_PROVIDER_SITE_OTHER): Payer: Medicare Other | Admitting: Family Medicine

## 2020-06-19 ENCOUNTER — Encounter (INDEPENDENT_AMBULATORY_CARE_PROVIDER_SITE_OTHER): Payer: Self-pay | Admitting: Family Medicine

## 2020-06-19 ENCOUNTER — Other Ambulatory Visit: Payer: Self-pay

## 2020-06-19 VITALS — BP 153/73 | HR 71 | Temp 98.2°F | Ht 62.0 in | Wt 180.0 lb

## 2020-06-19 DIAGNOSIS — Z6833 Body mass index (BMI) 33.0-33.9, adult: Secondary | ICD-10-CM | POA: Diagnosis not present

## 2020-06-19 DIAGNOSIS — E559 Vitamin D deficiency, unspecified: Secondary | ICD-10-CM

## 2020-06-19 DIAGNOSIS — E669 Obesity, unspecified: Secondary | ICD-10-CM | POA: Diagnosis not present

## 2020-06-19 DIAGNOSIS — M545 Low back pain, unspecified: Secondary | ICD-10-CM

## 2020-06-19 DIAGNOSIS — R7303 Prediabetes: Secondary | ICD-10-CM

## 2020-06-19 DIAGNOSIS — E7849 Other hyperlipidemia: Secondary | ICD-10-CM | POA: Diagnosis not present

## 2020-06-19 DIAGNOSIS — R030 Elevated blood-pressure reading, without diagnosis of hypertension: Secondary | ICD-10-CM

## 2020-06-19 MED ORDER — VITAMIN D (ERGOCALCIFEROL) 1.25 MG (50000 UNIT) PO CAPS: 50000.0000 [IU] | ORAL_CAPSULE | ORAL | 0 refills | Status: DC

## 2020-06-19 NOTE — Progress Notes (Signed)
    Subjective:    CC: Low back pain  I, Molly Weber, LAT, ATC, am serving as scribe for Dr. Lynne Leader.  HPI: Pt is a 74 y/o female presenting w/ LBP.  Pt reports pain has been ongoing since awhile. Pt locates her pain to primarily right side low back that radiates down posterior aspect of R  leg down to foot and ankle. Pt reports she had surgery, 2 rods and 8 screws at 74 yo. Pt c/o difficulty sleeping at night and LBP that is getting worse and worse.  Radiating pain: yes LE numbness/tingling: yes LE weakness: no Aggravating factors: walking, standing, vacuuming, sweeping Treatments tried: water aerobics  Diagnostic testing: L-spine XR- 05/23/19  Pertinent review of Systems: No fevers or chills  Relevant historical information: History of lumbar fusion L4-L5 and L5-S1 8 years ago.   Objective:    Vitals:   06/20/20 0838  BP: 136/82  Pulse: 78  SpO2: 96%   General: Well Developed, well nourished, and in no acute distress.   MSK: L-spine normal-appearing. Decreased lumbar motion. Extremity strength is intact. Positive right-sided slump test. Reflexes and sensation equal bilateral extremities.   Lab and Radiology Results X-ray images lumbar spine personally and independently interpreted Stable appearing fusion with intact hardware at L4-5 and L5-S1.  Degenerative changes present throughout lumbar spine.  No fractures or malalignment visible.   Impression and Recommendations:    Assessment and Plan: 74 y.o. female with right leg pain thought to be due to right lumbar radiculopathy at L5 dermatomal pattern.  She has a history of fusion at these levels with worsening symptoms recently.  Fortunately no significant weakness or numbness.  Discussed options.  Plan to increase gabapentin to up to 600 mg 3 times a day.  Doubtful that more physical therapy or more home exercise program will help.  We will proceed to advanced imaging with CT myelogram to plan for epidural  steroid injection.  I discussed with radiology today whether not we should do an MRI versus myelogram.  Based on how much surgical hardware she has both myself and the radiologist is concerned that she will have quite a bit of artifact that may interfere accurate diagnosis with MRI.  Check back after CT myelogram   PDMP not reviewed this encounter. Orders Placed This Encounter  Procedures  . DG Lumbar Spine 2-3 Views    Standing Status:   Future    Number of Occurrences:   1    Standing Expiration Date:   06/20/2021    Order Specific Question:   Reason for Exam (SYMPTOM  OR DIAGNOSIS REQUIRED)    Answer:   eval right leg pain. Suspect right L5 radic    Order Specific Question:   Preferred imaging location?    Answer:   [facility]   Meds ordered this encounter  Medications  . gabapentin (NEURONTIN) 300 MG capsule    Sig: Take 1-2 capsules (300-600 mg total) by mouth 3 (three) times daily as needed.    Dispense:  90 capsule    Refill:  3    Discussed warning signs or symptoms. Please see discharge instructions. Patient expresses understanding.   The above documentation has been reviewed and is accurate and complete Lynne Leader, M.D.

## 2020-06-20 ENCOUNTER — Ambulatory Visit (INDEPENDENT_AMBULATORY_CARE_PROVIDER_SITE_OTHER): Payer: Medicare Other

## 2020-06-20 ENCOUNTER — Ambulatory Visit: Payer: Medicare Other | Admitting: Family Medicine

## 2020-06-20 ENCOUNTER — Other Ambulatory Visit: Payer: Self-pay

## 2020-06-20 ENCOUNTER — Telehealth: Payer: Self-pay

## 2020-06-20 VITALS — BP 136/82 | HR 78 | Ht 62.0 in | Wt 184.0 lb

## 2020-06-20 DIAGNOSIS — M5416 Radiculopathy, lumbar region: Secondary | ICD-10-CM

## 2020-06-20 DIAGNOSIS — Z981 Arthrodesis status: Secondary | ICD-10-CM

## 2020-06-20 MED ORDER — GABAPENTIN 300 MG PO CAPS
300.0000 mg | ORAL_CAPSULE | Freq: Three times a day (TID) | ORAL | 3 refills | Status: DC | PRN
Start: 2020-06-20 — End: 2020-08-07

## 2020-06-20 NOTE — Telephone Encounter (Signed)
Phone call to patient to verify medication list and allergies for myelogram procedure. Medications pt is currently taking are safe to continue to take. Advised pt if any new medications are started prior to procedure to call and make [place] aware. Pt also instructed to have a driver the day of the procedure, she would be with [place] around 2 hours the day of, and discharge instructions reviewed. Pt verbalized understanding.

## 2020-06-20 NOTE — Patient Instructions (Signed)
Thank you for coming in today.  Plan for xray today.   Plan for either MRI or CT myelogram soon.   Start new gabapentin mostly at bedtime. Ok to take up to 600mg  at a time.  If this is not helpful I will switch to Lyrica.   Recheck after MRI or CT scan.   Plan for epidural steroid injections.    Epidural Steroid Injection Patient Information  Description: The epidural space surrounds the nerves as they exit the spinal cord.  In some patients, the nerves can be compressed and inflamed by a bulging disc or a tight spinal canal (spinal stenosis).  By injecting steroids into the epidural space, we can bring irritated nerves into direct contact with a potentially helpful medication.  These steroids act directly on the irritated nerves and can reduce swelling and inflammation which often leads to decreased pain.  Epidural steroids may be injected anywhere along the spine and from the neck to the low back depending upon the location of your pain.   After numbing the skin with local anesthetic (like Novocaine), a small needle is passed into the epidural space slowly.  You may experience a sensation of pressure while this is being done.  The entire block usually last less than 10 minutes.  Conditions which may be treated by epidural steroids:   Low back and leg pain  Neck and arm pain  Spinal stenosis  Post-laminectomy syndrome  Herpes zoster (shingles) pain  Pain from compression fractures  Preparation for the injection:  1. Do not eat any solid food or dairy products within 8 hours of your appointment.  2. You may drink clear liquids up to 3 hours before appointment.  Clear liquids include water, black coffee, juice or soda.  No milk or cream please. 3. You may take your regular medication, including pain medications, with a sip of water before your appointment  Diabetics should hold regular insulin (if taken separately) and take 1/2 normal NPH dos the morning of the procedure.  Carry  some sugar containing items with you to your appointment. 4. A driver must accompany you and be prepared to drive you home after your procedure.  5. Bring all your current medications with your. 6. An IV may be inserted and sedation may be given at the discretion of the physician.   7. A blood pressure cuff, EKG and other monitors will often be applied during the procedure.  Some patients may need to have extra oxygen administered for a short period. 8. You will be asked to provide medical information, including your allergies, prior to the procedure.  We must know immediately if you are taking blood thinners (like Coumadin/Warfarin)  Or if you are allergic to IV iodine contrast (dye). We must know if you could possible be pregnant.  Possible side-effects:  Bleeding from needle site  Infection (rare, may require surgery)  Nerve injury (rare)  Numbness & tingling (temporary)  Difficulty urinating (rare, temporary)  Spinal headache ( a headache worse with upright posture)  Light -headedness (temporary)  Pain at injection site (several days)  Decreased blood pressure (temporary)  Weakness in arm/leg (temporary)  Pressure sensation in back/neck (temporary)  Call if you experience:  Fever/chills associated with headache or increased back/neck pain.  Headache worsened by an upright position.  New onset weakness or numbness of an extremity below the injection site  Hives or difficulty breathing (go to the emergency room)  Inflammation or drainage at the infection site  Severe back/neck  pain  Any new symptoms which are concerning to you  Please note:  Although the local anesthetic injected can often make your back or neck feel good for several hours after the injection, the pain will likely return.  It takes 3-7 days for steroids to work in the epidural space.  You may not notice any pain relief for at least that one week.  If effective, we will often do a series of three  injections spaced 3-6 weeks apart to maximally decrease your pain.  After the initial series, we generally will wait several months before considering a repeat injection of the same type.

## 2020-06-20 NOTE — Progress Notes (Signed)
Chief Complaint:   OBESITY Janice Pace is here to discuss her progress with her obesity treatment plan along with follow-up of her obesity related diagnoses.   Today's visit was #: 14 Starting weight: 191 lbs Starting date: 08/11/2019 Today's weight: 180 lbs Today's date: 06/19/2020 Total lbs lost to date: 11 lbs Body mass index is 32.92 kg/m.  Total weight loss percentage to date: -5.76%  Interim History: Janice Pace says she has not been getting enough water or protein.  She reports bilateral leg pain and numbness.  She says this is worse at night.  Also with low back pain.  She is taking aspirin or Advil at night.  Using Aspercreme and having 1 beer.  She has history of lumbar fusion. Nutrition Plan: the Category 1 Plan for 60% of the time.  Anti-obesity medications: None.  Activity: Babysitting.  Assessment/Plan:   1. Vitamin D deficiency At goal. Current vitamin D is 57.5, tested on 04/24/2020.   Plan:  [x]   Continue Vitamin D @50 ,000 IU every week. []   Continue home supplement daily. [x]   Follow-up for routine testing of Vitamin D at least 2-3 times per year to avoid over-replacement.  - Refill Vitamin D, Ergocalciferol, (DRISDOL) 1.25 MG (50000 UNIT) CAPS capsule; Take 1 capsule (50,000 Units total) by mouth every 14 (fourteen) days.  Dispense: 2 capsule; Refill: 0  2. Other hyperlipidemia Lipid-lowering medications: Crestor 10 mg daily.   Plan: Dietary changes: Increase soluble fiber. Decrease simple carbohydrates. Exercise changes: An average 40 minutes of moderate to vigorous-intensity aerobic activity 3 or 4 times per week.   Lab Results  Component Value Date   CHOL 162 04/24/2020   HDL 47 04/24/2020   LDLCALC 87 04/24/2020   TRIG 161 (H) 04/24/2020   Lab Results  Component Value Date   ALT 13 04/24/2020   AST 14 04/24/2020   ALKPHOS 94 04/24/2020   BILITOT 0.6 04/24/2020   The 10-year ASCVD risk score Janice Bussing DC Jr., et al., 2013) is: 25.6%   Values used to  calculate the score:     Age: 74 years     Sex: Female     Is Non-Hispanic African American: No     Diabetic: Yes     Tobacco smoker: No     Systolic Blood Pressure: 106 mmHg     Is BP treated: No     HDL Cholesterol: 47 mg/dL     Total Cholesterol: 162 mg/dL  3. Prediabetes A1c is 5.9.  She will continue to focus on protein-rich, low simple carbohydrate foods. We reviewed the importance of hydration, regular exercise for stress reduction, and restorative sleep.   Lab Results  Component Value Date   HGBA1C 5.9 (H) 04/24/2020   Lab Results  Component Value Date   INSULIN 15.0 04/24/2020   INSULIN 21.0 10/03/2019   4. Elevated BP without diagnosis of hypertension Nithila really wants to avoid the Dx of HTN, but understands that her BP has been elevated. Plan: Monitor BP at home. Avoid buying foods that are: processed, frozen, or prepackaged to avoid excess salt. We will continue to monitor symptoms as they relate to her weight loss journey.  BP Readings from Last 3 Encounters:  06/20/20 136/82  06/19/20 (!) 153/73  05/22/20 (!) 143/75   Lab Results  Component Value Date   CREATININE 0.68 04/24/2020   5. Low back pain Will refer Janice Pace to Sports Medicine or Dr. Ernestina Patches (PMR). We will continue to monitor symptoms as they relate to  her weight loss journey.  - Ambulatory referral to Sports Medicine  6. Class 1 obesity with serious comorbidity and body mass index (BMI) of 33.0 to 33.9 in adult, unspecified obesity type  Course: Janice Pace is currently in the action stage of change. As such, her goal is to continue with weight loss efforts.   Nutrition goals: She has agreed to the Category 1 Plan.   Exercise goals: As tolerated.  Behavioral modification strategies: increasing lean protein intake and increasing water intake.  Janice Pace has agreed to follow-up with our clinic in 4 weeks. She was informed of the importance of frequent follow-up visits to maximize her success with intensive  lifestyle modifications for her multiple health conditions.   Objective:   Blood pressure (!) 153/73, pulse 71, temperature 98.2 F (36.8 C), temperature source Oral, height 5\' 2"  (1.575 m), weight 180 lb (81.6 kg), SpO2 98 %. Body mass index is 32.92 kg/m.  General: Cooperative, alert, well developed, in no acute distress. HEENT: Conjunctivae and lids unremarkable. Cardiovascular: Regular rhythm.  Lungs: Normal work of breathing. Neurologic: No focal deficits.   Lab Results  Component Value Date   CREATININE 0.68 04/24/2020   BUN 12 04/24/2020   NA 142 04/24/2020   K 4.3 04/24/2020   CL 105 04/24/2020   CO2 24 04/24/2020   Lab Results  Component Value Date   ALT 13 04/24/2020   AST 14 04/24/2020   ALKPHOS 94 04/24/2020   BILITOT 0.6 04/24/2020   Lab Results  Component Value Date   HGBA1C 5.9 (H) 04/24/2020   HGBA1C 5.7 (H) 10/03/2019   HGBA1C 5.7 06/08/2019   Lab Results  Component Value Date   INSULIN 15.0 04/24/2020   INSULIN 21.0 10/03/2019   Lab Results  Component Value Date   TSH 2.100 10/03/2019   Lab Results  Component Value Date   CHOL 162 04/24/2020   HDL 47 04/24/2020   LDLCALC 87 04/24/2020   TRIG 161 (H) 04/24/2020   Lab Results  Component Value Date   WBC 6.0 04/24/2020   HGB 14.1 04/24/2020   HCT 41.9 04/24/2020   MCV 91 04/24/2020   PLT 211 04/24/2020   Obesity Behavioral Intervention:   Approximately 15 minutes were spent on the discussion below.  ASK: We discussed the diagnosis of obesity with Janice Pace today and [name] agreed to give [place] permission to discuss obesity behavioral modification therapy today.  ASSESS: Janice Pace has the diagnosis of obesity and her BMI today is 33.0. Janice Pace is in the action stage of change.   ADVISE: Janice Pace was educated on the multiple health risks of obesity as well as the benefit of weight loss to improve her health. She was advised of the need for long term treatment and the importance of lifestyle  modifications to improve her current health and to decrease her risk of future health problems.  AGREE: Multiple dietary modification options and treatment options were discussed and Janice Pace agreed to follow the recommendations documented in the above note.  ARRANGE: Kelie was educated on the importance of frequent visits to treat obesity as outlined per CMS and USPSTF guidelines and agreed to schedule her next follow up appointment today.  Attestation Statements:   Reviewed by clinician on day of visit: allergies, medications, problem list, medical history, surgical history, family history, social history, and previous encounter notes.  I, Water quality scientist, CMA, am acting as transcriptionist for Briscoe Deutscher, DO  I have reviewed the above documentation for accuracy and completeness, and I agree with  the above. Briscoe Deutscher, DO

## 2020-06-20 NOTE — Progress Notes (Signed)
X-ray shows some arthritis but shows intact surgical hardware.  I discussed with the radiologist.  We both believe that the best next test is CT myelogram to evaluate pinched nerve.  You should hear soon about scheduling CT myelogram.  Check back with me after the CT myelogram scan.

## 2020-06-27 ENCOUNTER — Telehealth: Payer: Self-pay | Admitting: Family Medicine

## 2020-06-27 ENCOUNTER — Ambulatory Visit
Admission: RE | Admit: 2020-06-27 | Discharge: 2020-06-27 | Disposition: A | Discharge status: Home / Self Care | Payer: Medicare Other | Source: Ambulatory Visit | Attending: Family Medicine | Admitting: Family Medicine

## 2020-06-27 DIAGNOSIS — Z981 Arthrodesis status: Secondary | ICD-10-CM

## 2020-06-27 DIAGNOSIS — M5416 Radiculopathy, lumbar region: Secondary | ICD-10-CM

## 2020-06-27 MED ORDER — ONDANSETRON HCL 4 MG/2ML IJ SOLN
4.0000 mg | Freq: Once | INTRAMUSCULAR | Status: AC
  Administered 2020-06-27: 4 mg via INTRAMUSCULAR

## 2020-06-27 MED ORDER — MEPERIDINE HCL 100 MG/ML IJ SOLN
50.0000 mg | Freq: Once | INTRAMUSCULAR | Status: AC
Start: 2020-06-27 — End: 2020-06-27
  Administered 2020-06-27: 50 mg via INTRAMUSCULAR

## 2020-06-27 MED ORDER — DIAZEPAM 5 MG PO TABS
5.0000 mg | ORAL_TABLET | Freq: Once | ORAL | Status: AC
  Administered 2020-06-27: 5 mg via ORAL

## 2020-06-27 MED ORDER — IOPAMIDOL (ISOVUE-M 200) INJECTION 41%
15.0000 mL | Freq: Once | INTRAMUSCULAR | Status: AC
  Administered 2020-06-27: 15 mL via INTRATHECAL

## 2020-06-27 NOTE — Telephone Encounter (Signed)
Pt had CT and Myelogram today, would like a call with results tomorrow if possible. [phone]

## 2020-06-27 NOTE — Discharge Instructions (Signed)

## 2020-06-28 NOTE — Telephone Encounter (Signed)
Spoke with pt and went over general information per the radiologist's impressions from the myelography and scheduled her a f/u appointment w/ Dr. Georgina Snell for 12/23.

## 2020-07-01 NOTE — Progress Notes (Signed)
CT myelogram fortunately shows Janice Pace what we needed to see.  The fusion looks solid and there is plenty of space where you had surgery.  The L5 nerve root does not appear to be compressed.  There is some mild stenosis up higher at L3-4 and L2-3.  You are scheduled to see me for follow-up on December 23.  We will discuss these results in further detail at that time and discuss next steps.

## 2020-07-04 NOTE — Progress Notes (Signed)
I, Wendy Poet, LAT, ATC, am serving as scribe for Dr. Lynne Leader.  Janice Pace is a 74 y.o. female who presents to [place] at [facility] today for f/u of LBP and R LE radicular pain and CT myelogram review.  She was last seen by Dr. Georgina Snell on 06/20/20 and was prescribed Gabapentin and referred for a L-spine CT myelogram.  Since her last visit, pt reports no change in her symptoms and reports constant pain in her low back that radiates into her R LE to her R ankle and foot.  She reports having fallen last Friday prior to get her CT myelogram.  She notes increased pain in her lower back since then.  Diagnostic testing: CT L-spine w/ contrast- 06/27/20; L-spine XR- 06/20/20 and 05/23/19   Pertinent review of systems: No fevers or chills  Relevant historical information: History lumbar fusion   Exam:  BP 140/90 (BP Location: Right Arm, Patient Position: Sitting, Cuff Size: Normal)   Pulse 70   Ht 5\' 2"  (1.575 m)   Wt 187 lb 9.6 oz (85.1 kg)   SpO2 96%   BMI 34.31 kg/m  General: Well Developed, well nourished, and in no acute distress.   MSK: Right leg strength intact    Lab and Radiology Results EXAM: LUMBAR MYELOGRAM  FLUOROSCOPY TIME:  Fluoroscopy Time: 44 seconds  Radiation Exposure Index: 317.90 microGray*m^2  PROCEDURE: After thorough discussion of risks and benefits of the procedure including bleeding, infection, injury to nerves, blood vessels, adjacent structures as well as headache and CSF leak, written and oral informed consent was obtained. Consent was obtained by Dr. Logan Bores. Time out form was completed.  Patient was positioned prone on the fluoroscopy table. Local anesthesia was provided with 1% lidocaine without epinephrine after prepped and draped in the usual sterile fashion. Puncture was performed at L2-3 using a 3 1/2 inch 22-gauge spinal needle via a right interlaminar approach. Using a single pass through the dura, the  needle was placed within the thecal sac, with return of clear CSF. 15 mL of Isovue M-200 was injected into the thecal sac, with normal opacification of the nerve roots and cauda equina consistent with free flow within the subarachnoid space.  I personally performed the lumbar puncture and administered the intrathecal contrast. I also personally supervised acquisition of the myelogram images.  TECHNIQUE: Contiguous axial images were obtained through the Lumbar spine after the intrathecal infusion of contrast. Coronal and sagittal reconstructions were obtained of the axial image sets.  COMPARISON:  CT abdomen and pelvis 10/08/2018. Lumbar spine radiographs 06/20/2020.  FINDINGS: LUMBAR MYELOGRAM FINDINGS:  There are 5 non rib-bearing lumbar type vertebrae. The ribs at T12 are hypoplastic.  Minimal retrolisthesis of L1 on L2 and L3 on L4 and minimal anterolisthesis of L4 on L5 do not significantly change with flexion or extension. There are small ventral extradural defects from L1-2 to L3-4 without evidence of high-grade spinal stenosis, however there is evidence of bilateral lateral recess stenosis at L2-3 and L3-4 with partial effacement of L3 and L4 nerve roots. L4-S1 fusion is noted with wide patency of the thecal sac at these levels.  CT LUMBAR MYELOGRAM FINDINGS:  There is trace retrolisthesis of L1 on L2, 2-3 mm retrolisthesis of L3 on L4, and 3 mm anterolisthesis of L4 on L5. No fracture or suspicious osseous lesion is identified. There is vacuum disc phenomenon from T12-L1 to L2-3. At L1-2 and L2-3, there is severe disc space narrowing with prominent  degenerative endplate sclerosis. Milder disc space narrowing and degenerative endplate changes are present at L3-4.  Sequelae of L4-S1 posterior and interbody fusion are identified with solid arthrodesis at both levels. Pedicle screws appear well-positioned without evidence of hardware complication  The conus  medullaris terminates at the upper L2 level. There is abdominal aortic atherosclerosis without aneurysm.  T12-L1: A moderate-sized, partially calcified left paracentral disc extrusion results in mild spinal stenosis with mild flattening and displacement of the spinal cord as well as displacement of left-sided intraspinal nerve roots. Patent neural foramina.  L1-2: Circumferential disc bulging without significant stenosis.  L2-3: Circumferential disc bulging and endplate spurring result in minimal to mild bilateral lateral recess stenosis and mild-to-moderate bilateral neural foraminal stenosis without significant spinal stenosis.  L3-4: Circumferential disc bulging and moderate facet hypertrophy result in mild-to-moderate bilateral lateral recess stenosis and moderate bilateral neural foraminal stenosis potentially affecting the L3 and L4 nerve roots bilaterally. There is no significant generalized spinal stenosis.  L4-5: Prior wide posterior decompression and fusion.  No stenosis.  L5-S1: Prior wide posterior decompression and fusion.  No stenosis.  IMPRESSION: 1. Solid L4-S1 fusion without stenosis. 2. Mild-to-moderate bilateral lateral recess and moderate bilateral neural foraminal stenosis at L3-4. 3. Mild bilateral lateral recess and mild-to-moderate bilateral neural foraminal stenosis at L2-3. 4. T12-L1 disc extrusion with mild spinal stenosis. 5. Aortic Atherosclerosis (ICD10-I70.0).   Electronically Signed   By: Logan Bores M.D.   On: 06/27/2020 13:02 I, Lynne Leader, personally (independently) visualized and performed the interpretation of the images attached in this note.     Assessment and Plan: 74 y.o. female with right leg pain thought to be due to lumbar radiculopathy.  Based on CT myelogram results likely the L3-4 level is the main cause.  I am not sure if this truly corresponds to the distribution of pain that she has which is more in L5.   Regardless we will proceed with trial of epidural steroid injection.  If not better would either consider repeat trial of epidural steroid injection or nerve conduction study to further characterize cause of pain.  Patient will let me know how she feels after epidural steroid injection.  PDMP not reviewed this encounter. Orders Placed This Encounter  Procedures  . DG INJECT DIAG/THERA/INC NEEDLE/CATH/PLC EPI/LUMB/SAC W/IMG    Standing Status:   Future    Standing Expiration Date:   07/05/2021    Order Specific Question:   Reason for Exam (SYMPTOM  OR DIAGNOSIS REQUIRED)    Answer:   right radicular pain. Level and technique per radiology.    Order Specific Question:   Preferred Imaging Location?    Answer:   [id number]    Order Specific Question:   Radiology Contrast Protocol - do NOT remove file path    Answer:   [email or web address]   No orders of the defined types were placed in this encounter.    Discussed warning signs or symptoms. Please see discharge instructions. Patient expresses understanding.   The above documentation has been reviewed and is accurate and complete Lynne Leader, M.D.  Total encounter time 20 minutes including face-to-face time with the patient and, reviewing past medical record, and charting on the date of service.   CT scan results and plan

## 2020-07-05 ENCOUNTER — Encounter: Payer: Self-pay | Admitting: Family Medicine

## 2020-07-05 ENCOUNTER — Other Ambulatory Visit: Payer: Self-pay

## 2020-07-05 ENCOUNTER — Telehealth: Payer: Self-pay | Admitting: Family Medicine

## 2020-07-05 ENCOUNTER — Ambulatory Visit: Payer: Medicare Other | Admitting: Family Medicine

## 2020-07-05 VITALS — BP 140/90 | HR 70 | Ht 62.0 in | Wt 187.6 lb

## 2020-07-05 DIAGNOSIS — M5416 Radiculopathy, lumbar region: Secondary | ICD-10-CM

## 2020-07-05 DIAGNOSIS — Z981 Arthrodesis status: Secondary | ICD-10-CM | POA: Diagnosis not present

## 2020-07-05 NOTE — Telephone Encounter (Signed)
[facility] did 1 of those on December 15

## 2020-07-05 NOTE — Patient Instructions (Signed)
Thank you for coming in today.  Please call [facility] at [phone] to schedule your spine injection.   STOP aspirin now.   Let me know how you feel after the shot.

## 2020-07-05 NOTE — Telephone Encounter (Signed)
Patient called stating Janice Pace called her regarding the Epidural that was ordered. They told her that they are not able to do this until she has either a CT or an MRI. Can you order one of these for her to have done?

## 2020-07-09 ENCOUNTER — Ambulatory Visit
Admission: RE | Admit: 2020-07-09 | Discharge: 2020-07-09 | Disposition: A | Discharge status: Home / Self Care | Payer: Medicare Other | Source: Ambulatory Visit | Attending: Family Medicine | Admitting: Family Medicine

## 2020-07-09 DIAGNOSIS — Z981 Arthrodesis status: Secondary | ICD-10-CM

## 2020-07-09 DIAGNOSIS — M5416 Radiculopathy, lumbar region: Secondary | ICD-10-CM

## 2020-07-09 MED ORDER — METHYLPREDNISOLONE ACETATE 40 MG/ML INJ SUSP (RADIOLOG
120.0000 mg | Freq: Once | INTRAMUSCULAR | Status: AC
  Administered 2020-07-09: 120 mg via EPIDURAL

## 2020-07-09 MED ORDER — IOPAMIDOL (ISOVUE-M 200) INJECTION 41%
1.0000 mL | Freq: Once | INTRAMUSCULAR | Status: AC
  Administered 2020-07-09: 1 mL via EPIDURAL

## 2020-07-09 NOTE — Discharge Instructions (Signed)

## 2020-07-12 ENCOUNTER — Telehealth: Payer: Self-pay | Admitting: Family Medicine

## 2020-07-12 DIAGNOSIS — M5416 Radiculopathy, lumbar region: Secondary | ICD-10-CM

## 2020-07-12 DIAGNOSIS — Z981 Arthrodesis status: Secondary | ICD-10-CM

## 2020-07-12 NOTE — Telephone Encounter (Signed)
It is a little early to tell how much benefit you are going to get out of the first shot.  I would recommend you give it till next week.  However I went ahead and put an order in for a second epidural steroid injection.  If not better significantly by next week call [facility] imaging to schedule the second shot.  Call [phone].

## 2020-07-12 NOTE — Telephone Encounter (Signed)
Called pt and relayed Dr. Zollie Pee advice.  Pt states that [facility] already called her and she is scheduled for a 2nd epidural on 07/24/20.

## 2020-07-12 NOTE — Telephone Encounter (Signed)
Patient called stating that the Epidural she had done on 07/09/2020 has helped a little bit but she thinks she needs the second one. Can this be ordered for her?

## 2020-07-23 ENCOUNTER — Other Ambulatory Visit: Payer: Self-pay

## 2020-07-23 ENCOUNTER — Encounter (INDEPENDENT_AMBULATORY_CARE_PROVIDER_SITE_OTHER): Payer: Self-pay | Admitting: Family Medicine

## 2020-07-23 ENCOUNTER — Ambulatory Visit (INDEPENDENT_AMBULATORY_CARE_PROVIDER_SITE_OTHER): Payer: Medicare Other | Admitting: Family Medicine

## 2020-07-23 VITALS — BP 143/80 | HR 76 | Temp 97.9°F | Ht 62.0 in | Wt 186.0 lb

## 2020-07-23 DIAGNOSIS — I700 Atherosclerosis of aorta: Secondary | ICD-10-CM | POA: Insufficient documentation

## 2020-07-23 DIAGNOSIS — R030 Elevated blood-pressure reading, without diagnosis of hypertension: Secondary | ICD-10-CM | POA: Diagnosis not present

## 2020-07-23 DIAGNOSIS — E669 Obesity, unspecified: Secondary | ICD-10-CM

## 2020-07-23 DIAGNOSIS — Z6834 Body mass index (BMI) 34.0-34.9, adult: Secondary | ICD-10-CM | POA: Diagnosis not present

## 2020-07-23 DIAGNOSIS — M48061 Spinal stenosis, lumbar region without neurogenic claudication: Secondary | ICD-10-CM

## 2020-07-23 DIAGNOSIS — E559 Vitamin D deficiency, unspecified: Secondary | ICD-10-CM | POA: Diagnosis not present

## 2020-07-23 MED ORDER — VITAMIN D (ERGOCALCIFEROL) 1.25 MG (50000 UNIT) PO CAPS: 50000.0000 [IU] | ORAL_CAPSULE | ORAL | 0 refills | Status: DC

## 2020-07-23 NOTE — Progress Notes (Signed)
Chief Complaint:   OBESITY Janice Pace is here to discuss her progress with her obesity treatment plan along with follow-up of her obesity related diagnoses.   Today's visit was #: 15 Starting weight: 191 lbs Starting date: 08/11/2019 Today's weight: 186 lbs Today's date: 07/23/2020 Total lbs lost to date: 5 lbs Body mass index is 34.02 kg/m.  Total weight loss percentage to date: -2.62%  Interim History: Janice Pace has white coat syndrome.  She reports that her home blood pressures are 100-120/47-67.She says her weight gain is due to more eating and less movement.  She is now taking gabapentin, though taking it irregularly. Nutrition Plan: the Category 1 Plan for 4% of the time.  Activity: She is not regularly exercising at this time.  Assessment/Plan:   1. Vitamin D deficiency At goal. Current vitamin D is 57.5, tested on 04/24/2020.   Plan:  [x]   Continue Vitamin D @50 ,000 IU every week. []   Continue home supplement daily. [x]   Follow-up for routine testing of Vitamin D at least 2-3 times per year to avoid over-replacement.  - Refill Vitamin D, Ergocalciferol, (DRISDOL) 1.25 MG (50000 UNIT) CAPS capsule; Take 1 capsule (50,000 Units total) by mouth every 14 (fourteen) days.  Dispense: 2 capsule; Refill: 0  2. Spinal stenosis of lumbar region Followed by Dr. Georgina Snell.  Reviewed notes/images/procedures.  She is scheduled for her second [facility] tomorrow.  3. White coat syndrome with high blood pressure but without hypertension Home blood pressures are within normal limits.  Will monitor.  4. Aortic atherosclerosis ([place]) Seen on 06/27/2020 on lumbar CT.  She will continue to focus on protein-rich, low simple carbohydrate foods. We reviewed the importance of hydration, regular exercise for stress reduction, and restorative sleep.   The 10-year ASCVD risk score Janice Pace DC Brooke Bonito., et al., 2013) is: 32.5%   Values used to calculate the score:     Age: 75 years     Sex: Female     Is  Non-Hispanic African American: No     Diabetic: Yes     Tobacco smoker: No     Systolic Blood Pressure: 272 mmHg     Is BP treated: No     HDL Cholesterol: 47 mg/dL     Total Cholesterol: 162 mg/dL  5. Class 1 obesity with serious comorbidity and body mass index (BMI) of 34.0 to 34.9 in adult, unspecified obesity type  Course: Janice Pace is currently in the action stage of change. As such, her goal is to continue with weight loss efforts.   Nutrition goals: She has agreed to the Category 1 Plan.  Focus on protein and water intake.  Exercise goals: As tolerated.  Behavioral modification strategies: increasing lean protein intake, increasing water intake and emotional eating strategies.  Janice Pace has agreed to follow-up with our clinic in 4 weeks. She was informed of the importance of frequent follow-up visits to maximize her success with intensive lifestyle modifications for her multiple health conditions.   Objective:   Blood pressure (!) 143/80, pulse 76, temperature 97.9 F (36.6 C), temperature source Oral, height 5\' 2"  (1.575 m), weight 186 lb (84.4 kg), SpO2 97 %. Body mass index is 34.02 kg/m.  General: Cooperative, alert, well developed, in no acute distress. HEENT: Conjunctivae and lids unremarkable. Cardiovascular: Regular rhythm.  Lungs: Normal work of breathing. Neurologic: No focal deficits.   Lab Results  Component Value Date   CREATININE 0.68 04/24/2020   BUN 12 04/24/2020   NA 142 04/24/2020  K 4.3 04/24/2020   CL 105 04/24/2020   CO2 24 04/24/2020   Lab Results  Component Value Date   ALT 13 04/24/2020   AST 14 04/24/2020   ALKPHOS 94 04/24/2020   BILITOT 0.6 04/24/2020   Lab Results  Component Value Date   HGBA1C 5.9 (H) 04/24/2020   HGBA1C 5.7 (H) 10/03/2019   HGBA1C 5.7 06/08/2019   Lab Results  Component Value Date   INSULIN 15.0 04/24/2020   INSULIN 21.0 10/03/2019   Lab Results  Component Value Date   TSH 2.100 10/03/2019   Lab Results   Component Value Date   CHOL 162 04/24/2020   HDL 47 04/24/2020   LDLCALC 87 04/24/2020   TRIG 161 (H) 04/24/2020   Lab Results  Component Value Date   WBC 6.0 04/24/2020   HGB 14.1 04/24/2020   HCT 41.9 04/24/2020   MCV 91 04/24/2020   PLT 211 04/24/2020   Obesity Behavioral Intervention:   Approximately 15 minutes were spent on the discussion below.  ASK: We discussed the diagnosis of obesity with Janice Pace today and Janice Pace agreed to give [place] permission to discuss obesity behavioral modification therapy today.  ASSESS: Janice Pace has the diagnosis of obesity and her BMI today is 34.0. Janice Pace is in the action stage of change.   ADVISE: Janice Pace was educated on the multiple health risks of obesity as well as the benefit of weight loss to improve her health. She was advised of the need for long term treatment and the importance of lifestyle modifications to improve her current health and to decrease her risk of future health problems.  AGREE: Multiple dietary modification options and treatment options were discussed and Janice Pace agreed to follow the recommendations documented in the above note.  ARRANGE: Janice Pace was educated on the importance of frequent visits to treat obesity as outlined per CMS and USPSTF guidelines and agreed to schedule her next follow up appointment today.  Attestation Statements:   Reviewed by clinician on day of visit: allergies, medications, problem list, medical history, surgical history, family history, social history, and previous encounter notes.  I, Water quality scientist, CMA, am acting as transcriptionist for Briscoe Deutscher, DO  I have reviewed the above documentation for accuracy and completeness, and I agree with the above. Briscoe Deutscher, DO

## 2020-07-24 ENCOUNTER — Ambulatory Visit
Admission: RE | Admit: 2020-07-24 | Discharge: 2020-07-24 | Disposition: A | Discharge status: Home / Self Care | Payer: Medicare Other | Source: Ambulatory Visit | Attending: Family Medicine | Admitting: Family Medicine

## 2020-07-24 DIAGNOSIS — M47817 Spondylosis without myelopathy or radiculopathy, lumbosacral region: Secondary | ICD-10-CM | POA: Diagnosis not present

## 2020-07-24 DIAGNOSIS — Z981 Arthrodesis status: Secondary | ICD-10-CM

## 2020-07-24 DIAGNOSIS — M5416 Radiculopathy, lumbar region: Secondary | ICD-10-CM

## 2020-07-24 MED ORDER — IOPAMIDOL (ISOVUE-M 200) INJECTION 41%
1.0000 mL | Freq: Once | INTRAMUSCULAR | Status: AC
  Administered 2020-07-24: 1 mL via EPIDURAL

## 2020-07-24 MED ORDER — METHYLPREDNISOLONE ACETATE 40 MG/ML INJ SUSP (RADIOLOG: 120.0000 mg | Freq: Once | INTRAMUSCULAR | Status: DC

## 2020-07-24 MED ORDER — METHYLPREDNISOLONE ACETATE 40 MG/ML INJ SUSP (RADIOLOG
120.0000 mg | Freq: Once | INTRAMUSCULAR | Status: AC
  Administered 2020-07-24: 120 mg via EPIDURAL

## 2020-07-24 MED ORDER — IOPAMIDOL (ISOVUE-M 200) INJECTION 41%: 1.0000 mL | Freq: Once | INTRAMUSCULAR | Status: DC

## 2020-07-24 NOTE — Discharge Instructions (Signed)

## 2020-07-26 DIAGNOSIS — Z1231 Encounter for screening mammogram for malignant neoplasm of breast: Secondary | ICD-10-CM | POA: Diagnosis not present

## 2020-08-06 NOTE — Progress Notes (Signed)
I, Christoper Fabian, LAT, ATC, am serving as scribe for Dr. Clementeen Graham.  Janice Pace is a 75 y.o. female who presents to [facility] today for f/u of chronic LBP and R LE radicular pain.  She was last seen by Dr. Denyse Amass on 07/05/20 and noted no change in her symptoms.  She was referred for a 2nd lumbar ESI that she had on 07/24/20 (R L2-3).  Since her last visit, pt reports that she is no better following her 2nd epidural.  Her back pain is the most significant today compared to her R leg pain that is running all the way to her foot.   Her main issue is that her back hurts on both sides in her mid lumbar spine.  She thinks her leg pain is much less significant than it has been.  She notes that she fell just prior to her CT scan of her lumbar spine and had started her back pain.  Diagnostic testing: CT L-spine w/ contrast- 06/27/20; L-spine XR- 06/20/20   Pertinent review of systems: No fevers or chills  Relevant historical information: Lumbar fusion   Exam:  BP (!) 170/98 (BP Location: Right Arm, Patient Position: Sitting, Cuff Size: Normal)    Pulse 73    Ht 5\' 2"  (1.575 m)    Wt 188 lb 9.6 oz (85.5 kg)    SpO2 97%    BMI 34.50 kg/m  General: Well Developed, well nourished, and in no acute distress.   MSK: L-spine normal-appearing Nontender midline. Tender palpation lumbar paraspinal musculature.  Paraspinal muscles are rigid in spasm and tender to palpation. Decreased lumbar motion. Lower extremity strength is intact.    Lab and Radiology Results  EXAM: LUMBAR MYELOGRAM  FLUOROSCOPY TIME:  Fluoroscopy Time: 44 seconds  Radiation Exposure Index: 317.90 microGray*m^2  PROCEDURE: After thorough discussion of risks and benefits of the procedure including bleeding, infection, injury to nerves, blood vessels, adjacent structures as well as headache and CSF leak, written and oral informed consent was obtained. Consent was obtained by Dr. . Time out form was completed.  Patient was positioned prone on the fluoroscopy table. Local anesthesia was provided with 1% lidocaine without epinephrine after prepped and draped in the usual sterile fashion. Puncture was performed at L2-3 using a 3 1/2 inch 22-gauge spinal needle via a right interlaminar approach. Using a single pass through the dura, the needle was placed within the thecal sac, with return of clear CSF. 15 mL of Isovue M-200 was injected into the thecal sac, with normal opacification of the nerve roots and cauda equina consistent with free flow within the subarachnoid space.  I personally performed the lumbar puncture and administered the intrathecal contrast. I also personally supervised acquisition of the myelogram images.  TECHNIQUE: Contiguous axial images were obtained through the Lumbar spine after the intrathecal infusion of contrast. Coronal and sagittal reconstructions were obtained of the axial image sets.  COMPARISON:  CT abdomen and pelvis 10/08/2018. Lumbar spine radiographs 06/20/2020.  FINDINGS: LUMBAR MYELOGRAM FINDINGS:  There are 5 non rib-bearing lumbar type vertebrae. The ribs at T12 are hypoplastic.  Minimal retrolisthesis of L1 on L2 and L3 on L4 and minimal anterolisthesis of L4 on L5 do not significantly change with flexion or extension. There are small ventral extradural defects from L1-2 to L3-4 without evidence of high-grade spinal stenosis, however there is evidence of bilateral lateral recess stenosis at L2-3 and L3-4 with partial effacement of L3 and  L4 nerve roots. L4-S1 fusion is noted with wide patency of the thecal sac at these levels.  CT LUMBAR MYELOGRAM FINDINGS:  There is trace retrolisthesis of L1 on L2, 2-3 mm retrolisthesis of L3 on L4, and 3 mm anterolisthesis of L4 on L5. No fracture or suspicious osseous lesion is identified. There is vacuum disc phenomenon from T12-L1 to L2-3. At L1-2 and L2-3,  there is severe disc space narrowing with prominent degenerative endplate sclerosis. Milder disc space narrowing and degenerative endplate changes are present at L3-4.  Sequelae of L4-S1 posterior and interbody fusion are identified with solid arthrodesis at both levels. Pedicle screws appear well-positioned without evidence of hardware complication  The conus medullaris terminates at the upper L2 level. There is abdominal aortic atherosclerosis without aneurysm.  T12-L1: A moderate-sized, partially calcified left paracentral disc extrusion results in mild spinal stenosis with mild flattening and displacement of the spinal cord as well as displacement of left-sided intraspinal nerve roots. Patent neural foramina.  L1-2: Circumferential disc bulging without significant stenosis.  L2-3: Circumferential disc bulging and endplate spurring result in minimal to mild bilateral lateral recess stenosis and mild-to-moderate bilateral neural foraminal stenosis without significant spinal stenosis.  L3-4: Circumferential disc bulging and moderate facet hypertrophy result in mild-to-moderate bilateral lateral recess stenosis and moderate bilateral neural foraminal stenosis potentially affecting the L3 and L4 nerve roots bilaterally. There is no significant generalized spinal stenosis.  L4-5: Prior wide posterior decompression and fusion.  No stenosis.  L5-S1: Prior wide posterior decompression and fusion.  No stenosis.  IMPRESSION: 1. Solid L4-S1 fusion without stenosis. 2. Mild-to-moderate bilateral lateral recess and moderate bilateral neural foraminal stenosis at L3-4. 3. Mild bilateral lateral recess and mild-to-moderate bilateral neural foraminal stenosis at L2-3. 4. T12-L1 disc extrusion with mild spinal stenosis. 5. Aortic Atherosclerosis (ICD10-I70.0).   Electronically Signed   By: Logan Bores M.D.   On: 06/27/2020 13:02  I, Lynne Leader, personally (independently)  visualized and performed the interpretation of the images attached in this note.     Assessment and Plan: 75 y.o. female with acute exacerbation of chronic low back pain.  This is a separate issue from what I have been seeing her for.  Previously was evaluating her for her lumbar radiculopathy without much back pain.  Her main issue now is low back pain thought to be due to lumbar sacral spasm and dysfunction.  This explains why she is not improved much with her back pain with the epidural steroid injections.  I think it is likely that she has had pretty good improvement with the epidural steroid injections with her leg pain.  Spent time discussing her treatment plan and options.  Plan for physical therapy is her main treatment option, will use heating pad and TENS unit.  Limited tizanidine and very limited hydrocodone.  Cautioned against sedation with these medicines especially with combined with gabapentin.  Additionally cautioned against constipation with hydrocodone.   Additionally stated that it anytime patient is absolutely welcome to request a referral back to neurosurgery for second opinion although I do not think that she is a good surgical candidate to manage her acute back pain.  Patient will keep me updated.    PDMP reviewed during this encounter. Orders Placed This Encounter  Procedures   Ambulatory referral to Physical Therapy    Referral Priority:   Routine    Referral Type:   Physical Medicine    Referral Reason:   Specialty Services Required    Requested Specialty:  Physical Therapy    Number of Visits Requested:   1   Meds ordered this encounter  Medications   tiZANidine (ZANAFLEX) 2 MG tablet    Sig: Take 1-2 tablets (2-4 mg total) by mouth every 8 (eight) hours as needed for muscle spasms.    Dispense:  90 tablet    Refill:  2   gabapentin (NEURONTIN) 300 MG capsule    Sig: Take 1-2 capsules (300-600 mg total) by mouth 3 (three) times daily as needed.     Dispense:  90 capsule    Refill:  3   HYDROcodone-acetaminophen (NORCO/VICODIN) 5-325 MG tablet    Sig: Take 1 tablet by mouth every 6 (six) hours as needed.    Dispense:  15 tablet    Refill:  0     Discussed warning signs or symptoms. Please see discharge instructions. Patient expresses understanding.   The above documentation has been reviewed and is accurate and complete Lynne Leader, M.D.

## 2020-08-07 ENCOUNTER — Other Ambulatory Visit: Payer: Self-pay

## 2020-08-07 ENCOUNTER — Encounter: Payer: Self-pay | Admitting: Family Medicine

## 2020-08-07 ENCOUNTER — Ambulatory Visit: Payer: Medicare Other | Admitting: Family Medicine

## 2020-08-07 VITALS — BP 170/98 | HR 73 | Ht 62.0 in | Wt 188.6 lb

## 2020-08-07 DIAGNOSIS — M5416 Radiculopathy, lumbar region: Secondary | ICD-10-CM

## 2020-08-07 DIAGNOSIS — M5441 Lumbago with sciatica, right side: Secondary | ICD-10-CM

## 2020-08-07 DIAGNOSIS — G8929 Other chronic pain: Secondary | ICD-10-CM

## 2020-08-07 DIAGNOSIS — M5442 Lumbago with sciatica, left side: Secondary | ICD-10-CM

## 2020-08-07 DIAGNOSIS — S39012A Strain of muscle, fascia and tendon of lower back, initial encounter: Secondary | ICD-10-CM

## 2020-08-07 MED ORDER — HYDROCODONE-ACETAMINOPHEN 5-325 MG PO TABS: 1.0000 | ORAL_TABLET | Freq: Four times a day (QID) | ORAL | 0 refills | Status: DC | PRN

## 2020-08-07 MED ORDER — GABAPENTIN 300 MG PO CAPS: 300.0000 mg | ORAL_CAPSULE | Freq: Three times a day (TID) | ORAL | 3 refills | Status: DC | PRN

## 2020-08-07 MED ORDER — TIZANIDINE HCL 2 MG PO TABS: 2.0000 mg | ORAL_TABLET | Freq: Three times a day (TID) | ORAL | 2 refills | Status: DC | PRN

## 2020-08-07 NOTE — Patient Instructions (Signed)
Thank you for coming in today.  I've referred you to Physical Therapy.  Let us know if you don't hear from them in one week.  Try heating pad and TENS unit.   Let me know if you want a surgical consultation again.  I think the surgeon will recommend PT.   Use the medicines sparingly.  Use laxatives with hydrocodone.   Try do do some normal thing every day if can even a light back rub. You are trying to get your body and nerves used to normal sensation again.  This is really hard to do.   Let me know how you are doing.   TENS UNIT: This is helpful for muscle pain and spasm.   Search and Purchase a TENS 7000 2nd edition at  www.tenspros.com or www.[email or web address].com It should be less than $30.     TENS unit instructions: Do not shower or bathe with the unit on . Turn the unit off before removing electrodes or batteries . If the electrodes lose stickiness add a drop of water to the electrodes after they are disconnected from the unit and place on plastic sheet. If you continued to have difficulty, call the TENS unit company to purchase more electrodes. . Do not apply lotion on the skin area prior to use. Make sure the skin is clean and dry as this will help prolong the life of the electrodes. . After use, always check skin for unusual red areas, rash or other skin difficulties. If there are any skin problems, does not apply electrodes to the same area. . Never remove the electrodes from the unit by pulling the wires. . Do not use the TENS unit or electrodes other than as directed. . Do not change electrode placement without consultating your therapist or physician. Marland Kitchen Keep 2 fingers with between each electrode. . Wear time ratio is 2:1, on to off times.    For example on for 30 minutes off for 15 minutes and then on for 30 minutes off for 15 minutes

## 2020-08-08 DIAGNOSIS — M545 Low back pain, unspecified: Secondary | ICD-10-CM | POA: Diagnosis not present

## 2020-08-14 DIAGNOSIS — M545 Low back pain, unspecified: Secondary | ICD-10-CM | POA: Diagnosis not present

## 2020-08-16 DIAGNOSIS — M545 Low back pain, unspecified: Secondary | ICD-10-CM | POA: Diagnosis not present

## 2020-08-20 ENCOUNTER — Ambulatory Visit (INDEPENDENT_AMBULATORY_CARE_PROVIDER_SITE_OTHER): Payer: Medicare Other | Admitting: Family Medicine

## 2020-08-21 DIAGNOSIS — M545 Low back pain, unspecified: Secondary | ICD-10-CM | POA: Diagnosis not present

## 2020-08-23 DIAGNOSIS — M545 Low back pain, unspecified: Secondary | ICD-10-CM | POA: Diagnosis not present

## 2020-09-20 DIAGNOSIS — R350 Frequency of micturition: Secondary | ICD-10-CM | POA: Diagnosis not present

## 2020-09-24 NOTE — Progress Notes (Signed)
I, Wendy Poet, LAT, ATC, am serving as scribe for Dr. Lynne Leader.  Janice Pace is a 75 y.o. female who presents to [place] at [facility] today for f/u of LBP and R LE radicular pain.  She was last seen by Dr. Georgina Snell on 08/07/20 and noted no improvement following her 2nd ESI (R L2-3) that she had on 07/24/20.  She was referred to PT at [facility] and prescribed tizanidine, gabapentin and hydrocodone-acetaminophen.  Since her last visit, pt reports that she's not doing well.  She reports having done PT x 3 weeks and states that it helped some w/ the pain on the R side of her back but did nothing for her midline LBP or R leg pain.  She con't to have pain radiating into her R LE to her ankle w/ some intermittent numbness and tingling.  She has been taking the Gabapentin consistently but only uses the Tizanidine and hyrdrocodone-acetaminophen sparingly.  Diagnostic testing: L-spine CT- 06/27/20; L-spine XR- 06/20/20  Pertinent review of systems: No fevers or chills  Relevant historical information: Lumbar fusion.   Exam:  BP 136/78 (BP Location: Right Arm, Patient Position: Sitting, Cuff Size: Normal)   Pulse 72   Ht 5\' 2"  (1.575 m)   Wt 194 lb 3.2 oz (88.1 kg)   SpO2 95%   BMI 35.52 kg/m  General: Well Developed, well nourished, and in no acute distress.   MSK: L-spine decreased lumbar motion.    Lab and Radiology Results   EXAM: LUMBAR MYELOGRAM  FLUOROSCOPY TIME:  Fluoroscopy Time: 44 seconds  Radiation Exposure Index: 317.90 microGray*m^2  PROCEDURE: After thorough discussion of risks and benefits of the procedure including bleeding, infection, injury to nerves, blood vessels, adjacent structures as well as headache and CSF leak, written and oral informed consent was obtained. Consent was obtained by Dr. Logan Bores. Time out form was completed.  Patient was positioned prone on the fluoroscopy table. Local anesthesia was provided with 1%  lidocaine without epinephrine after prepped and draped in the usual sterile fashion. Puncture was performed at L2-3 using a 3 1/2 inch 22-gauge spinal needle via a right interlaminar approach. Using a single pass through the dura, the needle was placed within the thecal sac, with return of clear CSF. 15 mL of Isovue M-200 was injected into the thecal sac, with normal opacification of the nerve roots and cauda equina consistent with free flow within the subarachnoid space.  I personally performed the lumbar puncture and administered the intrathecal contrast. I also personally supervised acquisition of the myelogram images.  TECHNIQUE: Contiguous axial images were obtained through the Lumbar spine after the intrathecal infusion of contrast. Coronal and sagittal reconstructions were obtained of the axial image sets.  COMPARISON:  CT abdomen and pelvis 10/08/2018. Lumbar spine radiographs 06/20/2020.  FINDINGS: LUMBAR MYELOGRAM FINDINGS:  There are 5 non rib-bearing lumbar type vertebrae. The ribs at T12 are hypoplastic.  Minimal retrolisthesis of L1 on L2 and L3 on L4 and minimal anterolisthesis of L4 on L5 do not significantly change with flexion or extension. There are small ventral extradural defects from L1-2 to L3-4 without evidence of high-grade spinal stenosis, however there is evidence of bilateral lateral recess stenosis at L2-3 and L3-4 with partial effacement of L3 and L4 nerve roots. L4-S1 fusion is noted with wide patency of the thecal sac at these levels.  CT LUMBAR MYELOGRAM FINDINGS:  There is trace retrolisthesis of L1 on L2, 2-3 mm retrolisthesis of L3  on L4, and 3 mm anterolisthesis of L4 on L5. No fracture or suspicious osseous lesion is identified. There is vacuum disc phenomenon from T12-L1 to L2-3. At L1-2 and L2-3, there is severe disc space narrowing with prominent degenerative endplate sclerosis. Milder disc space narrowing and degenerative  endplate changes are present at L3-4.  Sequelae of L4-S1 posterior and interbody fusion are identified with solid arthrodesis at both levels. Pedicle screws appear well-positioned without evidence of hardware complication  The conus medullaris terminates at the upper L2 level. There is abdominal aortic atherosclerosis without aneurysm.  T12-L1: A moderate-sized, partially calcified left paracentral disc extrusion results in mild spinal stenosis with mild flattening and displacement of the spinal cord as well as displacement of left-sided intraspinal nerve roots. Patent neural foramina.  L1-2: Circumferential disc bulging without significant stenosis.  L2-3: Circumferential disc bulging and endplate spurring result in minimal to mild bilateral lateral recess stenosis and mild-to-moderate bilateral neural foraminal stenosis without significant spinal stenosis.  L3-4: Circumferential disc bulging and moderate facet hypertrophy result in mild-to-moderate bilateral lateral recess stenosis and moderate bilateral neural foraminal stenosis potentially affecting the L3 and L4 nerve roots bilaterally. There is no significant generalized spinal stenosis.  L4-5: Prior wide posterior decompression and fusion.  No stenosis.  L5-S1: Prior wide posterior decompression and fusion.  No stenosis.  IMPRESSION: 1. Solid L4-S1 fusion without stenosis. 2. Mild-to-moderate bilateral lateral recess and moderate bilateral neural foraminal stenosis at L3-4. 3. Mild bilateral lateral recess and mild-to-moderate bilateral neural foraminal stenosis at L2-3. 4. T12-L1 disc extrusion with mild spinal stenosis. 5. Aortic Atherosclerosis (ICD10-I70.0).   Electronically Signed   By: Logan Bores M.D.   On: 06/27/2020 13:02  I, Lynne Leader, personally (independently) visualized and performed the interpretation of the images attached in this note.      Assessment and Plan: 75 y.o. female  with chronic back pain and current lumbar radiculopathy.  2 separate but related issues.  Lumbar radiculopathy.  Repeat lumbar epidural steroid injection.  This will be the third epidural steroid injection since late December.  Chronic low back pain: Failing conservative management with physical therapy.  Plan for facet injections.  Pain is mostly right-sided.  Worse to facet joints on neuroimaging are right L2-3 and L3-4.  Anticipate the patient will probably need multiple repeat facet injection or ablations.  She is a good candidate for referral to pain management.  Referral placed to pain management today.  Recheck back as needed.    PDMP not reviewed this encounter. Orders Placed This Encounter  Procedures  . DG INJECT DIAG/THERA/INC NEEDLE/CATH/PLC EPI/LUMB/SAC W/IMG    EPI LUMB #3  [facility] PACS (06/27/20)  194 LBS NO THIN/OTC *SCREENED*    Standing Status:   Future    Standing Expiration Date:   09/25/2021    Order Specific Question:   Reason for Exam (SYMPTOM  OR DIAGNOSIS REQUIRED)    Answer:   right radicular pain. Level and technique per radiology    Order Specific Question:   Preferred Imaging Location?    Answer:   [facility]    Order Specific Question:   Radiology Contrast Protocol - do NOT remove file path    Answer:   \\[email or web address].[email or web address]  . DG FACET JT INJ L /S SINGLE LEVEL RIGHT W/FL/CT    LUMB FACETS INJ RT L3-4  [facility]  PACS (06/27/20)  194 LBS NO THIN/OTC *SCREENED*    Standing Status:   Future    Standing Expiration Date:  09/25/2021    Order Specific Question:   Reason for Exam (SYMPTOM  OR DIAGNOSIS REQUIRED)    Answer:   Rt L3-4    Order Specific Question:   Preferred Imaging Location?    Answer:   [facility]    Order Specific Question:   Radiology Contrast Protocol - do NOT remove file path    Answer:   \\[email or web address]s.[email or web address]  . DG FACET JT INJ L /S 2ND LEVEL RIGHT  W/FL/CT    LUMB FACETS INJ RT L3-4  [facility]  PACS (06/27/20)  194 LBS NO THIN/OTC *SCREENED*    Standing Status:   Future    Standing Expiration Date:   09/25/2021    Order Specific Question:   Reason for Exam (SYMPTOM  OR DIAGNOSIS REQUIRED)    Answer:   Rt L2-3    Order Specific Question:   Preferred Imaging Location?    Answer:   [facility]    Order Specific Question:   Radiology Contrast Protocol - do NOT remove file path    Answer:   \\[email or web address]   No orders of the defined types were placed in this encounter.    Discussed warning signs or symptoms. Please see discharge instructions. Patient expresses understanding.   The above documentation has been reviewed and is accurate and complete Lynne Leader, M.D.  Total encounter time 30 minutes including face-to-face time with the patient and, reviewing past medical record, and charting on the date of service.   Discussion treatment plan and options.

## 2020-09-25 ENCOUNTER — Ambulatory Visit: Payer: Medicare Other | Admitting: Family Medicine

## 2020-09-25 ENCOUNTER — Other Ambulatory Visit: Payer: Self-pay

## 2020-09-25 ENCOUNTER — Encounter: Payer: Self-pay | Admitting: Family Medicine

## 2020-09-25 VITALS — BP 136/78 | HR 72 | Ht 62.0 in | Wt 194.2 lb

## 2020-09-25 DIAGNOSIS — M5442 Lumbago with sciatica, left side: Secondary | ICD-10-CM | POA: Diagnosis not present

## 2020-09-25 DIAGNOSIS — M5416 Radiculopathy, lumbar region: Secondary | ICD-10-CM

## 2020-09-25 DIAGNOSIS — M5441 Lumbago with sciatica, right side: Secondary | ICD-10-CM

## 2020-09-25 DIAGNOSIS — G8929 Other chronic pain: Secondary | ICD-10-CM | POA: Diagnosis not present

## 2020-09-25 NOTE — Patient Instructions (Addendum)
Thank you for coming in today.  Please call [facility] at [phone] to schedule your spine injection.   You should also hear from pain doctor as well. This may take a month.  Let me know how you feel after the two injections about a week.

## 2020-09-28 ENCOUNTER — Other Ambulatory Visit: Payer: Self-pay

## 2020-09-28 ENCOUNTER — Ambulatory Visit
Admission: RE | Admit: 2020-09-28 | Discharge: 2020-09-28 | Disposition: A | Discharge status: Home / Self Care | Payer: Medicare Other | Source: Ambulatory Visit | Attending: Family Medicine | Admitting: Family Medicine

## 2020-09-28 DIAGNOSIS — M47817 Spondylosis without myelopathy or radiculopathy, lumbosacral region: Secondary | ICD-10-CM | POA: Diagnosis not present

## 2020-09-28 DIAGNOSIS — M5416 Radiculopathy, lumbar region: Secondary | ICD-10-CM

## 2020-09-28 MED ORDER — METHYLPREDNISOLONE ACETATE 40 MG/ML INJ SUSP (RADIOLOG
120.0000 mg | Freq: Once | INTRAMUSCULAR | Status: AC
  Administered 2020-09-28: 120 mg via EPIDURAL

## 2020-09-28 MED ORDER — IOPAMIDOL (ISOVUE-M 200) INJECTION 41%
1.0000 mL | Freq: Once | INTRAMUSCULAR | Status: AC
  Administered 2020-09-28: 1 mL via EPIDURAL

## 2020-09-28 NOTE — Discharge Instructions (Signed)

## 2020-10-03 ENCOUNTER — Encounter: Payer: Self-pay | Admitting: Physical Medicine & Rehabilitation

## 2020-10-03 DIAGNOSIS — R8290 Unspecified abnormal findings in urine: Secondary | ICD-10-CM | POA: Diagnosis not present

## 2020-10-03 DIAGNOSIS — M546 Pain in thoracic spine: Secondary | ICD-10-CM | POA: Diagnosis not present

## 2020-10-04 ENCOUNTER — Other Ambulatory Visit: Payer: Medicare Other

## 2020-10-04 ENCOUNTER — Inpatient Hospital Stay: Admission: RE | Admit: 2020-10-04 | Payer: Medicare Other | Source: Ambulatory Visit

## 2020-10-08 ENCOUNTER — Telehealth: Payer: Self-pay

## 2020-10-08 DIAGNOSIS — G8929 Other chronic pain: Secondary | ICD-10-CM

## 2020-10-08 DIAGNOSIS — M5416 Radiculopathy, lumbar region: Secondary | ICD-10-CM

## 2020-10-08 NOTE — Telephone Encounter (Signed)
Patient called stating she would like to continue with the plan to see a neurosurgeon. Would like to see Dr. Saintclair Halsted because she has seen him before for her back. Patient states she has gotten one of the spine injections but cannot get the second one until June.

## 2020-10-09 NOTE — Telephone Encounter (Signed)
Spoke w/ pt to let her know Dr. Georgina Snell placed a referral to neurosurgery.

## 2020-10-09 NOTE — Addendum Note (Signed)
Addended by: Gregor Hams on: 10/09/2020 07:51 AM   Modules accepted: Orders

## 2020-10-09 NOTE — Telephone Encounter (Signed)
Referral placed.

## 2020-10-15 ENCOUNTER — Other Ambulatory Visit: Payer: Medicare Other

## 2020-10-16 DIAGNOSIS — M5440 Lumbago with sciatica, unspecified side: Secondary | ICD-10-CM | POA: Diagnosis not present

## 2020-10-16 DIAGNOSIS — R030 Elevated blood-pressure reading, without diagnosis of hypertension: Secondary | ICD-10-CM | POA: Diagnosis not present

## 2020-10-16 DIAGNOSIS — M8580 Other specified disorders of bone density and structure, unspecified site: Secondary | ICD-10-CM | POA: Diagnosis not present

## 2020-10-24 DIAGNOSIS — M85851 Other specified disorders of bone density and structure, right thigh: Secondary | ICD-10-CM | POA: Diagnosis not present

## 2020-10-24 DIAGNOSIS — Z780 Asymptomatic menopausal state: Secondary | ICD-10-CM | POA: Diagnosis not present

## 2020-10-29 DIAGNOSIS — M5440 Lumbago with sciatica, unspecified side: Secondary | ICD-10-CM | POA: Diagnosis not present

## 2020-11-02 NOTE — Progress Notes (Signed)
Surgical Instructions    Your procedure is scheduled on 11/07/20.  Report to [facility] Main Entrance "A" at 09:05 A.M., then check in with the Admitting office.  Call this number if you have problems the morning of surgery:  [phone]   If you have any questions prior to your surgery date call [phone]: Open Monday-Friday 8am-4pm    Remember:  Do not eat after midnight the night before your surgery  You may drink clear liquids until 08:05am the morning of your surgery.   Clear liquids allowed are: Water, Non-Citrus Juices (without pulp), Carbonated Beverages, Clear Tea, Black Coffee Only, and Gatorade    Take these medicines the morning of surgery with A SIP OF WATER  albuterol (VENTOLIN HFA) inhaler if needed gabapentin (NEURONTIN) HYDROcodone-acetaminophen (NORCO/VICODIN)  omeprazole (PRILOSEC)  rosuvastatin (CRESTOR)   As of today, STOP taking any Aspirin (unless otherwise instructed by your surgeon) Aleve, Naproxen, Ibuprofen, Motrin, Advil, Goody's, BC's, all herbal medications, fish oil, and all vitamins.                     Do not wear jewelry, make up, or nail polish            Do not wear lotions, powders, perfumes/colognes, or deodorant.            Do not shave 48 hours prior to surgery.              Do not bring valuables to the hospital.            [facility] is not responsible for any belongings or valuables.  Do NOT Smoke (Tobacco/Vaping) or drink Alcohol 24 hours prior to your procedure If you use a CPAP at night, you may bring all equipment for your overnight stay.   Contacts, glasses, dentures or bridgework may not be worn into surgery, please bring cases for these belongings   For patients admitted to the hospital, discharge time will be determined by your treatment team.   Patients discharged the day of surgery will not be allowed to drive home, and someone needs to stay with them for 24 hours.    Special instructions:   [place]- Preparing  For Surgery  Before surgery, you can play an important role. Because skin is not sterile, your skin needs to be as free of germs as possible. You can reduce the number of germs on your skin by washing with CHG (chlorahexidine gluconate) Soap before surgery.  CHG is an antiseptic cleaner which kills germs and bonds with the skin to continue killing germs even after washing.    Oral Hygiene is also important to reduce your risk of infection.  Remember - BRUSH YOUR TEETH THE MORNING OF SURGERY WITH YOUR REGULAR TOOTHPASTE  Please do not use if you have an allergy to CHG or antibacterial soaps. If your skin becomes reddened/irritated stop using the CHG.  Do not shave (including legs and underarms) for at least 48 hours prior to first CHG shower. It is OK to shave your face.  Please follow these instructions carefully.   1. Shower the NIGHT BEFORE SURGERY and the MORNING OF SURGERY  2. If you chose to wash your hair, wash your hair first as usual with your normal shampoo.  3. After you shampoo, rinse your hair and body thoroughly to remove the shampoo.  4. Wash Face and genitals (private parts) with your normal soap.   5.  Shower the [employer] SURGERY and the  MORNING OF SURGERY with CHG Soap.   6. Use CHG Soap as you would any other liquid soap. You can apply CHG directly to the skin and wash gently with a scrungie or a clean washcloth.   7. Apply the CHG Soap to your body ONLY FROM THE NECK DOWN.  Do not use on open wounds or open sores. Avoid contact with your eyes, ears, mouth and genitals (private parts). Wash Face and genitals (private parts)  with your normal soap.   8. Wash thoroughly, paying special attention to the area where your surgery will be performed.  9. Thoroughly rinse your body with warm water from the neck down.  10. DO NOT shower/wash with your normal soap after using and rinsing off the CHG Soap.  11. Pat yourself dry with a CLEAN TOWEL.  12. Wear CLEAN PAJAMAS to  bed the night before surgery  13. Place CLEAN SHEETS on your bed the night before your surgery  14. DO NOT SLEEP WITH PETS.   Day of Surgery: Take a shower with CHG soap. Wear Clean/Comfortable clothing the morning of surgery Do not apply any deodorants/lotions.   Remember to brush your teeth WITH YOUR REGULAR TOOTHPASTE.   Please read over the following fact sheets that you were given.

## 2020-11-05 ENCOUNTER — Inpatient Hospital Stay (HOSPITAL_COMMUNITY)
Admission: RE | Admit: 2020-11-05 | Discharge: 2020-11-05 | Disposition: A | Discharge status: Home / Self Care | Payer: Medicare Other | Source: Ambulatory Visit

## 2020-11-06 ENCOUNTER — Encounter: Payer: Medicare Other | Admitting: Physical Medicine & Rehabilitation

## 2020-11-07 ENCOUNTER — Inpatient Hospital Stay: Admit: 2020-11-07 | Payer: Medicare Other | Admitting: Neurosurgery

## 2020-11-07 SURGERY — ANTERIOR LATERAL LUMBAR FUSION 2 LEVELS
Anesthesia: General | Site: Back

## 2020-12-11 DIAGNOSIS — M8580 Other specified disorders of bone density and structure, unspecified site: Secondary | ICD-10-CM | POA: Insufficient documentation

## 2020-12-11 DIAGNOSIS — M810 Age-related osteoporosis without current pathological fracture: Secondary | ICD-10-CM | POA: Insufficient documentation

## 2020-12-12 DIAGNOSIS — K219 Gastro-esophageal reflux disease without esophagitis: Secondary | ICD-10-CM | POA: Diagnosis not present

## 2020-12-12 DIAGNOSIS — I700 Atherosclerosis of aorta: Secondary | ICD-10-CM | POA: Diagnosis not present

## 2020-12-12 DIAGNOSIS — I2510 Atherosclerotic heart disease of native coronary artery without angina pectoris: Secondary | ICD-10-CM | POA: Diagnosis not present

## 2020-12-12 DIAGNOSIS — K760 Fatty (change of) liver, not elsewhere classified: Secondary | ICD-10-CM | POA: Diagnosis not present

## 2020-12-18 DIAGNOSIS — M5440 Lumbago with sciatica, unspecified side: Secondary | ICD-10-CM | POA: Diagnosis not present

## 2020-12-31 ENCOUNTER — Other Ambulatory Visit: Payer: Self-pay | Admitting: Neurosurgery

## 2020-12-31 DIAGNOSIS — M5440 Lumbago with sciatica, unspecified side: Secondary | ICD-10-CM

## 2021-01-09 ENCOUNTER — Other Ambulatory Visit: Payer: Self-pay

## 2021-01-09 ENCOUNTER — Ambulatory Visit
Admission: RE | Admit: 2021-01-09 | Discharge: 2021-01-09 | Disposition: A | Discharge status: Home / Self Care | Payer: Medicare Other | Source: Ambulatory Visit | Attending: Neurosurgery | Admitting: Neurosurgery

## 2021-01-09 DIAGNOSIS — M5440 Lumbago with sciatica, unspecified side: Secondary | ICD-10-CM

## 2021-01-09 DIAGNOSIS — M545 Low back pain, unspecified: Secondary | ICD-10-CM | POA: Diagnosis not present

## 2021-01-09 MED ORDER — METHYLPREDNISOLONE ACETATE 40 MG/ML INJ SUSP (RADIOLOG
120.0000 mg | Freq: Once | INTRAMUSCULAR | Status: AC
  Administered 2021-01-09: 120 mg via INTRA_ARTICULAR

## 2021-01-09 MED ORDER — IOPAMIDOL (ISOVUE-M 200) INJECTION 41%
1.0000 mL | Freq: Once | INTRAMUSCULAR | Status: AC
  Administered 2021-01-09: 1 mL via INTRA_ARTICULAR

## 2021-01-09 NOTE — Discharge Instructions (Signed)

## 2021-01-23 ENCOUNTER — Other Ambulatory Visit: Payer: Self-pay | Admitting: Neurosurgery

## 2021-01-29 ENCOUNTER — Ambulatory Visit: Payer: Medicare Other | Admitting: Physical Medicine & Rehabilitation

## 2021-01-31 ENCOUNTER — Ambulatory Visit: Payer: Medicare Other | Admitting: Physical Medicine & Rehabilitation

## 2021-02-13 ENCOUNTER — Telehealth: Payer: Self-pay | Admitting: Family Medicine

## 2021-02-13 NOTE — Telephone Encounter (Signed)
Patient called asking if Dr Georgina Snell would be willing to refill her gabapentin (NEURONTIN) 300 MG capsule to [facility] on [address]. She said that she has been seeing Dr Saintclair Halsted but he is out of the office this week and his office advised her to contact us for the refill.   Please advise.

## 2021-02-13 NOTE — Telephone Encounter (Signed)
error 

## 2021-02-15 MED ORDER — GABAPENTIN 300 MG PO CAPS: 300.0000 mg | ORAL_CAPSULE | Freq: Three times a day (TID) | ORAL | 2 refills | Status: DC | PRN

## 2021-02-15 NOTE — Telephone Encounter (Signed)
Medicine refilled. 

## 2021-02-18 NOTE — Progress Notes (Signed)
Surgical Instructions    Your procedure is scheduled on Wednesday, August 17th.  Report to [facility] Main Entrance "A" at 6:30 A.M., then check in with the Admitting office.  Call this number if you have problems the morning of surgery:  [phone]   If you have any questions prior to your surgery date call [phone]: Open Monday-Friday 8am-4pm    Remember:  Do not eat or drink after midnight the night before your surgery    Take these medicines the morning of surgery with A SIP OF WATER   Tylenol - if needed  Albuterol inhaler - if needed (bring with you on day of surgery)  Cyclobenzaprine (Flexeril)  Allegra - if needed  Gabapentin (Neurontin)  Hydrocodone-Acetaminophen - if needed  Omeprazole (Prilosec)  Rosuvastatin (Crestor)  Follow your surgeon's instructions on when to stop Aspirin.  If no instructions were given by your surgeon then you will need to call the office to get those instructions.    As of today, STOP taking any Aspirin (unless otherwise instructed by your surgeon) Aleve, Naproxen, Ibuprofen, Motrin, Advil, Goody's, BC's, all herbal medications, fish oil, and all vitamins.          Do not wear jewelry, makeup, or nail polish Do not wear lotions, powders, perfumes, or deodorant. Do not shave 48 hours prior to surgery.   Do not bring valuables to the hospital.              [facility] is not responsible for any belongings or valuables.  Do NOT Smoke (Tobacco/Vaping) or drink Alcohol 24 hours prior to your procedure If you use a CPAP at night, you may bring all equipment for your overnight stay.   Contacts, glasses, dentures or bridgework may not be worn into surgery, please bring cases for these belongings   For patients admitted to the hospital, discharge time will be determined by your treatment team.   Patients discharged the day of surgery will not be allowed to drive home, and someone needs to stay with them for 24 hours.  ONLY 1 SUPPORT  PERSON MAY BE PRESENT WHILE YOU ARE IN SURGERY. IF YOU ARE TO BE ADMITTED ONCE YOU ARE IN YOUR ROOM YOU WILL BE ALLOWED TWO (2) VISITORS.  Minor children may have two parents present. Special consideration for safety and communication needs will be reviewed on a case by case basis.  Special instructions:    Oral Hygiene is also important to reduce your risk of infection.  Remember - BRUSH YOUR TEETH THE MORNING OF SURGERY WITH YOUR REGULAR TOOTHPASTE   [place]- Preparing For Surgery  Before surgery, you can play an important role. Because skin is not sterile, your skin needs to be as free of germs as possible. You can reduce the number of germs on your skin by washing with CHG (chlorahexidine gluconate) Soap before surgery.  CHG is an antiseptic cleaner which kills germs and bonds with the skin to continue killing germs even after washing.     Please do not use if you have an allergy to CHG or antibacterial soaps. If your skin becomes reddened/irritated stop using the CHG.  Do not shave (including legs and underarms) for at least 48 hours prior to first CHG shower. It is OK to shave your face.  Please follow these instructions carefully.     Shower the NIGHT BEFORE SURGERY and the MORNING OF SURGERY with CHG Soap.   If you chose to wash your hair, wash your hair  first as usual with your normal shampoo. After you shampoo, rinse your hair and body thoroughly to remove the shampoo.  Then [employer] and genitals (private parts) with your normal soap and rinse thoroughly to remove soap.  After that Use CHG Soap as you would any other liquid soap. You can apply CHG directly to the skin and wash gently with a scrungie or a clean washcloth.   Apply the CHG Soap to your body ONLY FROM THE NECK DOWN.  Do not use on open wounds or open sores. Avoid contact with your eyes, ears, mouth and genitals (private parts). Wash Face and genitals (private parts)  with your normal soap.   Wash thoroughly,  paying special attention to the area where your surgery will be performed.  Thoroughly rinse your body with warm water from the neck down.  DO NOT shower/wash with your normal soap after using and rinsing off the CHG Soap.  Pat yourself dry with a CLEAN TOWEL.  Wear CLEAN PAJAMAS to bed the night before surgery  Place CLEAN SHEETS on your bed the night before your surgery  DO NOT SLEEP WITH PETS.   Day of Surgery:  Take a shower with CHG soap. Wear Clean/Comfortable clothing the morning of surgery Do not apply any deodorants/lotions.   Remember to brush your teeth WITH YOUR REGULAR TOOTHPASTE.   Please read over the following fact sheets that you were given.

## 2021-02-18 NOTE — Pre-Procedure Instructions (Signed)
Surgical Instructions:    Your procedure is scheduled on Wednesday, August 17th (08:30 AM- 3:22 PM).  Report to [facility] Main Entrance "A" at 06:30 A.M., then check in with the Admitting office.  Call this number if you have any questions prior to your surgery date, or have problems the morning of surgery:  [phone]    Remember:  Do not eat or drink after midnight the night before your surgery.     Take these medicines the morning of surgery with A SIP OF WATER:   omeprazole (PRILOSEC)  rosuvastatin (CRESTOR)   IF NEEDED: acetaminophen (TYLENOL) cyclobenzaprine (FLEXERIL)  fexofenadine (ALLEGRA) gabapentin (NEURONTIN) HYDROcodone-acetaminophen (NORCO/VICODIN) albuterol (VENTOLIN HFA) inhaler- Please bring with you the day of surgery.    *Follow your surgeon's instructions on when to stop Aspirin.  If no instructions were given by your surgeon then you will need to call the office to get those instructions.     As of today, STOP taking any Aleve, Naproxen, Ibuprofen, Motrin, Advil, Goody's, BC's, all herbal medications including (BLACK CHERRY CONCENTRATE PO), fish oil, and all vitamins including CALCIUM 600 + Vitamin D, HAIR/SKIN/NAILS Multivitamin.             Special instructions:    [place]- Preparing For Surgery  Before surgery, you can play an important role. Because skin is not sterile, your skin needs to be as free of germs as possible. You can reduce the number of germs on your skin by washing with CHG (chlorahexidine gluconate) Soap before surgery.  CHG is an antiseptic cleaner which kills germs and bonds with the skin to continue killing germs even after washing.     Please do not use if you have an allergy to CHG or antibacterial soaps. If your skin becomes reddened/irritated stop using the CHG.  Do not shave (including legs and underarms) for at least 48 hours prior to first CHG shower. It is OK to shave your face.  Please follow these instructions  carefully.     Shower the NIGHT BEFORE SURGERY and the MORNING OF SURGERY with CHG Soap.   If you chose to wash your hair, wash your hair first as usual with your normal shampoo. After you shampoo, rinse your hair and body thoroughly to remove the shampoo.  Then [employer] and genitals (private parts) with your normal soap and rinse thoroughly to remove soap.  After that Use CHG Soap as you would any other liquid soap. You can apply CHG directly to the skin and wash gently with a scrungie or a clean washcloth.   Apply the CHG Soap to your body ONLY FROM THE NECK DOWN.  Do not use on open wounds or open sores. Avoid contact with your eyes, ears, mouth and genitals (private parts). Wash Face and genitals (private parts)  with your normal soap.   Wash thoroughly, paying special attention to the area where your surgery will be performed.  Thoroughly rinse your body with warm water from the neck down.  DO NOT shower/wash with your normal soap after using and rinsing off the CHG Soap.  Pat yourself dry with a CLEAN TOWEL.  Wear CLEAN PAJAMAS to bed the night before surgery  Place CLEAN SHEETS on your bed the night before your surgery  DO NOT SLEEP WITH PETS.   Day of Surgery:  Take a shower with CHG soap. Wear Clean/Comfortable clothing the morning of surgery Do not wear lotions, powders, perfumes/colognes, or deodorant.   Remember to brush your teeth  WITH YOUR REGULAR TOOTHPASTE. Do not wear jewelry or makeup. DO Not wear nail polish, gel polish, artificial nails, or any other type of covering on natural nails including finger and toenails. If patients have artificial nails, gel coating, etc. that need to be removed by a nail salon please have this removed prior to surgery or surgery may need to be canceled/delayed if the surgeon/ anesthesia feels like the patient is unable to be adequately monitored. Do not shave 48 hours prior to surgery.   Do not bring valuables to the hospital. [facility] is not responsible for any belongings or valuables.  Do NOT Smoke (Tobacco/Vaping) or drink Alcohol 24 hours prior to your procedure.  If you use a CPAP at night, you may bring all equipment for your overnight stay.   Contacts, glasses, dentures or bridgework may not be worn into surgery, please bring cases for these belongings.   For patients admitted to the hospital, discharge time will be determined by your treatment team.  Patients discharged the day of surgery will not be allowed to drive home, and someone needs to stay with them for 24 hours.  ONLY 1 SUPPORT PERSON MAY BE PRESENT WHILE YOU ARE IN SURGERY. IF YOU ARE TO BE ADMITTED ONCE YOU ARE IN YOUR ROOM YOU WILL BE ALLOWED TWO (2) VISITORS.  Minor children may have two parents present. Special consideration for safety and communication needs will be reviewed on a case by case basis.     Please read over the following fact sheets that you were given.

## 2021-02-19 ENCOUNTER — Encounter (HOSPITAL_COMMUNITY): Payer: Self-pay

## 2021-02-19 ENCOUNTER — Other Ambulatory Visit: Payer: Self-pay

## 2021-02-19 ENCOUNTER — Encounter (HOSPITAL_COMMUNITY)
Admission: RE | Admit: 2021-02-19 | Discharge: 2021-02-19 | Disposition: A | Discharge status: Home / Self Care | Payer: Medicare Other | Source: Ambulatory Visit | Attending: Neurosurgery | Admitting: Neurosurgery

## 2021-02-19 DIAGNOSIS — Z01818 Encounter for other preprocedural examination: Secondary | ICD-10-CM | POA: Insufficient documentation

## 2021-02-19 HISTORY — DX: Prediabetes: R73.03

## 2021-02-19 HISTORY — DX: Bronchitis, not specified as acute or chronic: J40

## 2021-02-19 HISTORY — DX: Personal history of other diseases of the digestive system: Z87.19

## 2021-02-19 HISTORY — DX: Unspecified osteoarthritis, unspecified site: M19.90

## 2021-02-19 LAB — CBC
HCT: 40.7 % (ref 36.0–46.0)
Hemoglobin: 14 g/dL (ref 12.0–15.0)
MCH: 31.5 pg (ref 26.0–34.0)
MCHC: 34.4 g/dL (ref 30.0–36.0)
MCV: 91.7 fL (ref 80.0–100.0)
Platelets: 197 10*3/uL (ref 150–400)
RBC: 4.44 MIL/uL (ref 3.87–5.11)
RDW: 12.4 % (ref 11.5–15.5)
WBC: 6.8 10*3/uL (ref 4.0–10.5)
nRBC: 0 % (ref 0.0–0.2)

## 2021-02-19 LAB — TYPE AND SCREEN
ABO/RH(D): B POS
Antibody Screen: NEGATIVE

## 2021-02-19 LAB — GLUCOSE, CAPILLARY: Glucose-Capillary: 145 mg/dL — ABNORMAL HIGH (ref 70–99)

## 2021-02-19 LAB — BASIC METABOLIC PANEL
Anion gap: 8 (ref 5–15)
BUN: 12 mg/dL (ref 8–23)
CO2: 26 mmol/L (ref 22–32)
Calcium: 9.6 mg/dL (ref 8.9–10.3)
Chloride: 104 mmol/L (ref 98–111)
Creatinine, Ser: 0.83 mg/dL (ref 0.44–1.00)
GFR, Estimated: >60 mL/min (ref >60)
Glucose, Bld: 121 mg/dL — ABNORMAL HIGH (ref 70–99)
Potassium: 4.2 mmol/L (ref 3.5–5.1)
Sodium: 138 mmol/L (ref 135–145)

## 2021-02-19 LAB — SURGICAL PCR SCREEN
MRSA, PCR: NEGATIVE
Staphylococcus aureus: NEGATIVE

## 2021-02-19 LAB — HEMOGLOBIN A1C
Hgb A1c MFr Bld: 5.8 % — ABNORMAL HIGH (ref 4.8–5.6)
Mean Plasma Glucose: 119.76 mg/dL

## 2021-02-19 NOTE — Progress Notes (Signed)
PCP - Leeroy Cha, MD w/ [facility] @ [facility]; records requested Cardiologist - Denies  PPM/ICD - Denies  Chest x-ray - N/A EKG - 02/19/21 Stress Test - Denies ECHO - 04/28/14 Cardiac Cath - Denies  Sleep Study - Denies  Patient is pre-diabetic, does not check CBGs. CBG at PAT appt was 145 (non-fasting). A1C obtained  Blood Thinner Instructions: N/A Aspirin Instructions: Per pt, last dose 02/15/21  ERAS Protcol - N/A PRE-SURGERY Ensure or G2- N/A  COVID TEST- Pt instructed to go to [facility] Monday August 15th. Pt given map and order requisition sheet (instructed to bring this to COVID Testing appt)   Anesthesia review: Yes, pt with hx of murmur, review ECHO; review last office note and labs from PCP.  Patient denies shortness of breath, fever, cough and chest pain at PAT appointment   All instructions explained to the patient, with a verbal understanding of the material. Patient agrees to go over the instructions while at home for a better understanding.  The opportunity to ask questions was provided.

## 2021-02-25 ENCOUNTER — Other Ambulatory Visit: Payer: Self-pay | Admitting: Neurosurgery

## 2021-02-25 LAB — SARS CORONAVIRUS 2 (TAT 6-24 HRS): SARS Coronavirus 2: NEGATIVE

## 2021-02-27 ENCOUNTER — Inpatient Hospital Stay (HOSPITAL_COMMUNITY): Payer: Medicare Other

## 2021-02-27 ENCOUNTER — Inpatient Hospital Stay (HOSPITAL_COMMUNITY): Payer: Medicare Other | Admitting: Physician Assistant

## 2021-02-27 ENCOUNTER — Encounter (HOSPITAL_COMMUNITY): Payer: Self-pay | Admitting: Neurosurgery

## 2021-02-27 ENCOUNTER — Inpatient Hospital Stay (HOSPITAL_COMMUNITY): Payer: Medicare Other | Admitting: Certified Registered Nurse Anesthetist

## 2021-02-27 ENCOUNTER — Inpatient Hospital Stay (HOSPITAL_COMMUNITY)
Admission: RE | Admit: 2021-02-27 | Discharge: 2021-03-09 | DRG: 454 | Disposition: A | Discharge status: Home Health | Payer: Medicare Other | Attending: Neurosurgery | Admitting: Neurosurgery

## 2021-02-27 ENCOUNTER — Encounter (HOSPITAL_COMMUNITY)
Admission: RE | Disposition: A | Discharge status: Home Health | Payer: Self-pay | Source: Home / Self Care | Attending: Neurosurgery

## 2021-02-27 DIAGNOSIS — K567 Ileus, unspecified: Secondary | ICD-10-CM | POA: Diagnosis not present

## 2021-02-27 DIAGNOSIS — Z882 Allergy status to sulfonamides status: Secondary | ICD-10-CM

## 2021-02-27 DIAGNOSIS — E860 Dehydration: Secondary | ICD-10-CM | POA: Diagnosis not present

## 2021-02-27 DIAGNOSIS — E785 Hyperlipidemia, unspecified: Secondary | ICD-10-CM | POA: Diagnosis present

## 2021-02-27 DIAGNOSIS — Z7982 Long term (current) use of aspirin: Secondary | ICD-10-CM

## 2021-02-27 DIAGNOSIS — K219 Gastro-esophageal reflux disease without esophagitis: Secondary | ICD-10-CM | POA: Diagnosis not present

## 2021-02-27 DIAGNOSIS — R030 Elevated blood-pressure reading, without diagnosis of hypertension: Secondary | ICD-10-CM

## 2021-02-27 DIAGNOSIS — Z981 Arthrodesis status: Secondary | ICD-10-CM | POA: Diagnosis not present

## 2021-02-27 DIAGNOSIS — K5900 Constipation, unspecified: Secondary | ICD-10-CM | POA: Diagnosis not present

## 2021-02-27 DIAGNOSIS — E7849 Other hyperlipidemia: Secondary | ICD-10-CM

## 2021-02-27 DIAGNOSIS — M4326 Fusion of spine, lumbar region: Secondary | ICD-10-CM | POA: Diagnosis not present

## 2021-02-27 DIAGNOSIS — K760 Fatty (change of) liver, not elsewhere classified: Secondary | ICD-10-CM | POA: Diagnosis not present

## 2021-02-27 DIAGNOSIS — Q766 Other congenital malformations of ribs: Secondary | ICD-10-CM | POA: Diagnosis not present

## 2021-02-27 DIAGNOSIS — Z87891 Personal history of nicotine dependence: Secondary | ICD-10-CM | POA: Diagnosis not present

## 2021-02-27 DIAGNOSIS — K529 Noninfective gastroenteritis and colitis, unspecified: Secondary | ICD-10-CM | POA: Diagnosis not present

## 2021-02-27 DIAGNOSIS — M5136 Other intervertebral disc degeneration, lumbar region: Secondary | ICD-10-CM | POA: Diagnosis present

## 2021-02-27 DIAGNOSIS — K449 Diaphragmatic hernia without obstruction or gangrene: Secondary | ICD-10-CM | POA: Diagnosis not present

## 2021-02-27 DIAGNOSIS — R140 Abdominal distension (gaseous): Secondary | ICD-10-CM

## 2021-02-27 DIAGNOSIS — M47816 Spondylosis without myelopathy or radiculopathy, lumbar region: Secondary | ICD-10-CM | POA: Diagnosis not present

## 2021-02-27 DIAGNOSIS — M532X6 Spinal instabilities, lumbar region: Secondary | ICD-10-CM | POA: Diagnosis not present

## 2021-02-27 DIAGNOSIS — N390 Urinary tract infection, site not specified: Secondary | ICD-10-CM | POA: Diagnosis present

## 2021-02-27 DIAGNOSIS — M48061 Spinal stenosis, lumbar region without neurogenic claudication: Secondary | ICD-10-CM | POA: Diagnosis not present

## 2021-02-27 DIAGNOSIS — K560 Paralytic ileus: Secondary | ICD-10-CM | POA: Diagnosis not present

## 2021-02-27 DIAGNOSIS — K5939 Other megacolon: Secondary | ICD-10-CM | POA: Diagnosis not present

## 2021-02-27 DIAGNOSIS — R000 Tachycardia, unspecified: Secondary | ICD-10-CM | POA: Diagnosis not present

## 2021-02-27 DIAGNOSIS — M48062 Spinal stenosis, lumbar region with neurogenic claudication: Secondary | ICD-10-CM

## 2021-02-27 DIAGNOSIS — Z4682 Encounter for fitting and adjustment of non-vascular catheter: Secondary | ICD-10-CM | POA: Diagnosis not present

## 2021-02-27 DIAGNOSIS — Z90711 Acquired absence of uterus with remaining cervical stump: Secondary | ICD-10-CM

## 2021-02-27 DIAGNOSIS — E669 Obesity, unspecified: Secondary | ICD-10-CM

## 2021-02-27 DIAGNOSIS — G8929 Other chronic pain: Secondary | ICD-10-CM | POA: Diagnosis not present

## 2021-02-27 DIAGNOSIS — M549 Dorsalgia, unspecified: Secondary | ICD-10-CM | POA: Diagnosis not present

## 2021-02-27 DIAGNOSIS — Z79899 Other long term (current) drug therapy: Secondary | ICD-10-CM

## 2021-02-27 DIAGNOSIS — Q7649 Other congenital malformations of spine, not associated with scoliosis: Secondary | ICD-10-CM | POA: Diagnosis not present

## 2021-02-27 DIAGNOSIS — K429 Umbilical hernia without obstruction or gangrene: Secondary | ICD-10-CM | POA: Diagnosis not present

## 2021-02-27 DIAGNOSIS — E782 Mixed hyperlipidemia: Secondary | ICD-10-CM

## 2021-02-27 DIAGNOSIS — Z888 Allergy status to other drugs, medicaments and biological substances status: Secondary | ICD-10-CM | POA: Diagnosis not present

## 2021-02-27 DIAGNOSIS — R109 Unspecified abdominal pain: Secondary | ICD-10-CM

## 2021-02-27 DIAGNOSIS — K3189 Other diseases of stomach and duodenum: Secondary | ICD-10-CM | POA: Diagnosis not present

## 2021-02-27 DIAGNOSIS — K6389 Other specified diseases of intestine: Secondary | ICD-10-CM | POA: Diagnosis not present

## 2021-02-27 DIAGNOSIS — Z419 Encounter for procedure for purposes other than remedying health state, unspecified: Secondary | ICD-10-CM

## 2021-02-27 DIAGNOSIS — K9189 Other postprocedural complications and disorders of digestive system: Secondary | ICD-10-CM | POA: Diagnosis not present

## 2021-02-27 HISTORY — PX: LAMINECTOMY WITH POSTERIOR LATERAL ARTHRODESIS LEVEL 3: SHX6337

## 2021-02-27 HISTORY — PX: ANTERIOR LAT LUMBAR FUSION: SHX1168

## 2021-02-27 LAB — GLUCOSE, CAPILLARY: Glucose-Capillary: 146 mg/dL — ABNORMAL HIGH (ref 70–99)

## 2021-02-27 LAB — ABO/RH: ABO/RH(D): B POS

## 2021-02-27 SURGERY — ANTERIOR LATERAL LUMBAR FUSION 2 LEVELS
Anesthesia: General

## 2021-02-27 MED ORDER — CEFAZOLIN SODIUM-DEXTROSE 2-4 GM/100ML-% IV SOLN
2.0000 g | INTRAVENOUS | Status: AC
  Administered 2021-02-27 (×2): 2 g via INTRAVENOUS
  Filled 2021-02-27: qty 100

## 2021-02-27 MED ORDER — BUPIVACAINE LIPOSOME 1.3 % IJ SUSP
INTRAMUSCULAR | Status: AC
  Filled 2021-02-27: qty 20

## 2021-02-27 MED ORDER — MENTHOL 3 MG MT LOZG: 1.0000 | LOZENGE | OROMUCOSAL | Status: DC | PRN

## 2021-02-27 MED ORDER — PROPOFOL 10 MG/ML IV BOLUS
INTRAVENOUS | Status: AC
  Filled 2021-02-27: qty 20

## 2021-02-27 MED ORDER — ALBUMIN HUMAN 5 % IV SOLN: INTRAVENOUS | Status: DC | PRN

## 2021-02-27 MED ORDER — MIDAZOLAM HCL 2 MG/2ML IJ SOLN
INTRAMUSCULAR | Status: AC
  Filled 2021-02-27: qty 2

## 2021-02-27 MED ORDER — ROCURONIUM BROMIDE 10 MG/ML (PF) SYRINGE
PREFILLED_SYRINGE | INTRAVENOUS | Status: DC | PRN
  Administered 2021-02-27: 60 mg via INTRAVENOUS

## 2021-02-27 MED ORDER — PROPOFOL 500 MG/50ML IV EMUL
INTRAVENOUS | Status: DC | PRN
  Administered 2021-02-27: 50 ug/kg/min via INTRAVENOUS

## 2021-02-27 MED ORDER — THROMBIN 5000 UNITS EX SOLR
CUTANEOUS | Status: AC
  Filled 2021-02-27: qty 5000

## 2021-02-27 MED ORDER — LIDOCAINE 2% (20 MG/ML) 5 ML SYRINGE
INTRAMUSCULAR | Status: AC
  Filled 2021-02-27: qty 5

## 2021-02-27 MED ORDER — LIDOCAINE-EPINEPHRINE 1 %-1:100000 IJ SOLN
INTRAMUSCULAR | Status: AC
  Filled 2021-02-27: qty 1

## 2021-02-27 MED ORDER — BUPIVACAINE LIPOSOME 1.3 % IJ SUSP
INTRAMUSCULAR | Status: DC | PRN
  Administered 2021-02-27: 20 mL

## 2021-02-27 MED ORDER — SODIUM CHLORIDE 0.9 % IV SOLN
250.0000 mL | INTRAVENOUS | Status: DC
  Administered 2021-03-01: 250 mL via INTRAVENOUS

## 2021-02-27 MED ORDER — DEXAMETHASONE SODIUM PHOSPHATE 10 MG/ML IJ SOLN
INTRAMUSCULAR | Status: AC
  Filled 2021-02-27: qty 1

## 2021-02-27 MED ORDER — PHENYLEPHRINE HCL-NACL 20-0.9 MG/250ML-% IV SOLN
INTRAVENOUS | Status: DC | PRN
  Administered 2021-02-27: 20 ug/min via INTRAVENOUS

## 2021-02-27 MED ORDER — PROPOFOL 10 MG/ML IV BOLUS
INTRAVENOUS | Status: DC | PRN
  Administered 2021-02-27: 150 mg via INTRAVENOUS

## 2021-02-27 MED ORDER — THROMBIN 5000 UNITS EX SOLR
OROMUCOSAL | Status: DC | PRN
  Administered 2021-02-27: 5 mL via TOPICAL

## 2021-02-27 MED ORDER — DOCUSATE SODIUM 100 MG PO CAPS
500.0000 mg | ORAL_CAPSULE | Freq: Every day | ORAL | Status: DC
  Administered 2021-02-27 – 2021-03-07 (×7): 500 mg via ORAL
  Filled 2021-02-27 (×9): qty 5

## 2021-02-27 MED ORDER — ONDANSETRON HCL 4 MG/2ML IJ SOLN
INTRAMUSCULAR | Status: DC | PRN
  Administered 2021-02-27: 4 mg via INTRAVENOUS

## 2021-02-27 MED ORDER — 0.9 % SODIUM CHLORIDE (POUR BTL) OPTIME
TOPICAL | Status: DC | PRN
  Administered 2021-02-27: 1000 mL

## 2021-02-27 MED ORDER — SODIUM CHLORIDE 0.9% FLUSH
3.0000 mL | Freq: Two times a day (BID) | INTRAVENOUS | Status: DC
  Administered 2021-02-28 – 2021-03-09 (×12): 3 mL via INTRAVENOUS

## 2021-02-27 MED ORDER — THROMBIN 20000 UNITS EX SOLR
CUTANEOUS | Status: DC | PRN
  Administered 2021-02-27: 20 mL via TOPICAL

## 2021-02-27 MED ORDER — CALCIUM CARBONATE-VITAMIN D 500-200 MG-UNIT PO TABS
2.0000 | ORAL_TABLET | ORAL | Status: DC
  Administered 2021-02-27 – 2021-03-08 (×3): 2 via ORAL
  Filled 2021-02-27 (×2): qty 2

## 2021-02-27 MED ORDER — LACTATED RINGERS IV SOLN: INTRAVENOUS | Status: DC | PRN

## 2021-02-27 MED ORDER — OXYCODONE HCL 5 MG PO TABS
10.0000 mg | ORAL_TABLET | ORAL | Status: DC | PRN
  Administered 2021-02-27 – 2021-02-28 (×9): 10 mg via ORAL
  Filled 2021-02-27 (×10): qty 2

## 2021-02-27 MED ORDER — PHENOL 1.4 % MT LIQD: 1.0000 | OROMUCOSAL | Status: DC | PRN

## 2021-02-27 MED ORDER — CHLORHEXIDINE GLUCONATE CLOTH 2 % EX PADS: 6.0000 | MEDICATED_PAD | Freq: Once | CUTANEOUS | Status: DC

## 2021-02-27 MED ORDER — ROSUVASTATIN CALCIUM 5 MG PO TABS
10.0000 mg | ORAL_TABLET | Freq: Every day | ORAL | Status: DC
  Administered 2021-02-27 – 2021-03-09 (×8): 10 mg via ORAL
  Filled 2021-02-27 (×9): qty 2

## 2021-02-27 MED ORDER — PHENYLEPHRINE 40 MCG/ML (10ML) SYRINGE FOR IV PUSH (FOR BLOOD PRESSURE SUPPORT)
PREFILLED_SYRINGE | INTRAVENOUS | Status: DC | PRN
  Administered 2021-02-27 (×4): 80 ug via INTRAVENOUS
  Administered 2021-02-27: 40 ug via INTRAVENOUS
  Administered 2021-02-27: 120 ug via INTRAVENOUS

## 2021-02-27 MED ORDER — HYDROCODONE-ACETAMINOPHEN 5-325 MG PO TABS: 1.0000 | ORAL_TABLET | Freq: Every evening | ORAL | Status: DC | PRN

## 2021-02-27 MED ORDER — ONDANSETRON HCL 4 MG/2ML IJ SOLN: 4.0000 mg | Freq: Four times a day (QID) | INTRAMUSCULAR | Status: DC | PRN

## 2021-02-27 MED ORDER — HYDROMORPHONE HCL 1 MG/ML IJ SOLN
0.5000 mg | INTRAMUSCULAR | Status: DC | PRN
  Administered 2021-02-27 – 2021-02-28 (×5): 0.5 mg via INTRAVENOUS
  Filled 2021-02-27 (×4): qty 0.5

## 2021-02-27 MED ORDER — ROCURONIUM BROMIDE 10 MG/ML (PF) SYRINGE
PREFILLED_SYRINGE | INTRAVENOUS | Status: AC
  Filled 2021-02-27: qty 10

## 2021-02-27 MED ORDER — THROMBIN 20000 UNITS EX SOLR
CUTANEOUS | Status: AC
  Filled 2021-02-27: qty 20000

## 2021-02-27 MED ORDER — LIDOCAINE-EPINEPHRINE 1 %-1:100000 IJ SOLN
INTRAMUSCULAR | Status: DC | PRN
  Administered 2021-02-27: 10 mL
  Administered 2021-02-27: 15 mL

## 2021-02-27 MED ORDER — ACETAMINOPHEN 325 MG PO TABS
650.0000 mg | ORAL_TABLET | ORAL | Status: DC | PRN
  Administered 2021-02-27 – 2021-02-28 (×4): 650 mg via ORAL
  Filled 2021-02-27 (×5): qty 2

## 2021-02-27 MED ORDER — PROSIGHT PO TABS
1.0000 | ORAL_TABLET | Freq: Every day | ORAL | Status: DC
  Administered 2021-02-28 – 2021-03-09 (×7): 1 via ORAL
  Filled 2021-02-27 (×9): qty 1

## 2021-02-27 MED ORDER — ONDANSETRON HCL 4 MG/2ML IJ SOLN
INTRAMUSCULAR | Status: AC
  Filled 2021-02-27: qty 2

## 2021-02-27 MED ORDER — HYDROMORPHONE HCL 1 MG/ML IJ SOLN
INTRAMUSCULAR | Status: AC
  Filled 2021-02-27: qty 0.5

## 2021-02-27 MED ORDER — PROPOFOL 1000 MG/100ML IV EMUL
INTRAVENOUS | Status: AC
  Filled 2021-02-27: qty 100

## 2021-02-27 MED ORDER — MIDAZOLAM HCL 5 MG/5ML IJ SOLN
INTRAMUSCULAR | Status: DC | PRN
Start: 2021-02-27 — End: 2021-02-27
  Administered 2021-02-27 (×2): 1 mg via INTRAVENOUS

## 2021-02-27 MED ORDER — ALUM & MAG HYDROXIDE-SIMETH 200-200-20 MG/5ML PO SUSP
30.0000 mL | Freq: Four times a day (QID) | ORAL | Status: DC | PRN
  Administered 2021-02-27: 30 mL via ORAL
  Filled 2021-02-27: qty 30

## 2021-02-27 MED ORDER — SUCCINYLCHOLINE CHLORIDE 200 MG/10ML IV SOSY
PREFILLED_SYRINGE | INTRAVENOUS | Status: DC | PRN
  Administered 2021-02-27: 100 mg via INTRAVENOUS

## 2021-02-27 MED ORDER — FENTANYL CITRATE (PF) 250 MCG/5ML IJ SOLN
INTRAMUSCULAR | Status: AC
  Filled 2021-02-27: qty 5

## 2021-02-27 MED ORDER — LORATADINE 10 MG PO TABS
10.0000 mg | ORAL_TABLET | Freq: Every day | ORAL | Status: DC
  Administered 2021-02-28 – 2021-03-09 (×7): 10 mg via ORAL
  Filled 2021-02-27 (×8): qty 1

## 2021-02-27 MED ORDER — CYCLOBENZAPRINE HCL 5 MG PO TABS: 5.0000 mg | ORAL_TABLET | Freq: Every day | ORAL | Status: DC

## 2021-02-27 MED ORDER — EPHEDRINE SULFATE-NACL 50-0.9 MG/10ML-% IV SOSY
PREFILLED_SYRINGE | INTRAVENOUS | Status: DC | PRN
  Administered 2021-02-27 (×2): 5 mg via INTRAVENOUS

## 2021-02-27 MED ORDER — SODIUM CHLORIDE (PF) 0.9 % IJ SOLN
INTRAMUSCULAR | Status: AC
  Filled 2021-02-27: qty 20

## 2021-02-27 MED ORDER — PANTOPRAZOLE SODIUM 40 MG IV SOLR: 40.0000 mg | Freq: Every day | INTRAVENOUS | Status: DC

## 2021-02-27 MED ORDER — CYCLOBENZAPRINE HCL 5 MG PO TABS: 5.0000 mg | ORAL_TABLET | Freq: Every evening | ORAL | Status: DC | PRN

## 2021-02-27 MED ORDER — ALBUTEROL SULFATE (2.5 MG/3ML) 0.083% IN NEBU: 3.0000 mL | INHALATION_SOLUTION | Freq: Four times a day (QID) | RESPIRATORY_TRACT | Status: DC | PRN

## 2021-02-27 MED ORDER — CEFAZOLIN SODIUM-DEXTROSE 2-4 GM/100ML-% IV SOLN
2.0000 g | Freq: Three times a day (TID) | INTRAVENOUS | Status: AC
Start: 2021-02-27 — End: 2021-02-28
  Administered 2021-02-27 – 2021-02-28 (×2): 2 g via INTRAVENOUS
  Filled 2021-02-27 (×2): qty 100

## 2021-02-27 MED ORDER — ASCORBIC ACID 500 MG PO TABS
1000.0000 mg | ORAL_TABLET | Freq: Every day | ORAL | Status: DC
  Administered 2021-02-28 – 2021-03-09 (×7): 1000 mg via ORAL
  Filled 2021-02-27 (×8): qty 2

## 2021-02-27 MED ORDER — SUGAMMADEX SODIUM 200 MG/2ML IV SOLN
INTRAVENOUS | Status: DC | PRN
  Administered 2021-02-27: 200 mg via INTRAVENOUS

## 2021-02-27 MED ORDER — ASPIRIN EC 81 MG PO TBEC
81.0000 mg | DELAYED_RELEASE_TABLET | Freq: Every day | ORAL | Status: DC
  Administered 2021-02-28 – 2021-03-09 (×7): 81 mg via ORAL
  Filled 2021-02-27 (×8): qty 1

## 2021-02-27 MED ORDER — BLACK CHERRY CONCENTRATE PO LIQD: Freq: Every day | ORAL | Status: DC

## 2021-02-27 MED ORDER — GABAPENTIN 300 MG PO CAPS: 300.0000 mg | ORAL_CAPSULE | Freq: Three times a day (TID) | ORAL | Status: DC | PRN

## 2021-02-27 MED ORDER — CYCLOBENZAPRINE HCL 5 MG PO TABS: 5.0000 mg | ORAL_TABLET | ORAL | Status: DC

## 2021-02-27 MED ORDER — ACETAMINOPHEN 500 MG PO TABS: 1000.0000 mg | ORAL_TABLET | Freq: Four times a day (QID) | ORAL | Status: DC | PRN

## 2021-02-27 MED ORDER — DEXAMETHASONE SODIUM PHOSPHATE 10 MG/ML IJ SOLN
INTRAMUSCULAR | Status: DC | PRN
  Administered 2021-02-27: 10 mg via INTRAVENOUS

## 2021-02-27 MED ORDER — ORAL CARE MOUTH RINSE: 15.0000 mL | Freq: Once | OROMUCOSAL | Status: AC

## 2021-02-27 MED ORDER — ONDANSETRON HCL 4 MG/2ML IJ SOLN: 4.0000 mg | Freq: Once | INTRAMUSCULAR | Status: DC | PRN

## 2021-02-27 MED ORDER — ACETAMINOPHEN 10 MG/ML IV SOLN: 1000.0000 mg | Freq: Once | INTRAVENOUS | Status: DC | PRN

## 2021-02-27 MED ORDER — SODIUM CHLORIDE 0.9% FLUSH: 3.0000 mL | INTRAVENOUS | Status: DC | PRN

## 2021-02-27 MED ORDER — CEFAZOLIN SODIUM 1 G IJ SOLR
INTRAMUSCULAR | Status: AC
  Filled 2021-02-27: qty 20

## 2021-02-27 MED ORDER — PANTOPRAZOLE SODIUM 40 MG PO TBEC
40.0000 mg | DELAYED_RELEASE_TABLET | Freq: Every day | ORAL | Status: DC
  Administered 2021-02-27 – 2021-03-09 (×8): 40 mg via ORAL
  Filled 2021-02-27 (×9): qty 1

## 2021-02-27 MED ORDER — DEXMEDETOMIDINE HCL IN NACL 400 MCG/100ML IV SOLN
INTRAVENOUS | Status: DC | PRN
Start: 2021-02-27 — End: 2021-02-27
  Administered 2021-02-27: .7 ug/kg/h via INTRAVENOUS

## 2021-02-27 MED ORDER — CHLORHEXIDINE GLUCONATE 0.12 % MT SOLN
15.0000 mL | Freq: Once | OROMUCOSAL | Status: AC
  Administered 2021-02-27: 15 mL via OROMUCOSAL
  Filled 2021-02-27: qty 15

## 2021-02-27 MED ORDER — CYCLOBENZAPRINE HCL 10 MG PO TABS
10.0000 mg | ORAL_TABLET | Freq: Three times a day (TID) | ORAL | Status: DC | PRN
Start: 2021-02-27 — End: 2021-03-01
  Administered 2021-02-27 – 2021-02-28 (×3): 10 mg via ORAL
  Filled 2021-02-27 (×3): qty 1

## 2021-02-27 MED ORDER — LACTATED RINGERS IV SOLN: INTRAVENOUS | Status: DC

## 2021-02-27 MED ORDER — ACETAMINOPHEN 650 MG RE SUPP: 650.0000 mg | RECTAL | Status: DC | PRN

## 2021-02-27 MED ORDER — ONDANSETRON HCL 4 MG PO TABS
4.0000 mg | ORAL_TABLET | Freq: Four times a day (QID) | ORAL | Status: DC | PRN
  Administered 2021-03-04 – 2021-03-05 (×2): 4 mg via ORAL
  Filled 2021-02-27 (×2): qty 1

## 2021-02-27 MED ORDER — FENTANYL CITRATE (PF) 250 MCG/5ML IJ SOLN
INTRAMUSCULAR | Status: DC | PRN
  Administered 2021-02-27: 100 ug via INTRAVENOUS
  Administered 2021-02-27: 50 ug via INTRAVENOUS

## 2021-02-27 MED ORDER — HYDROMORPHONE HCL 1 MG/ML IJ SOLN: 0.2500 mg | INTRAMUSCULAR | Status: DC | PRN

## 2021-02-27 MED ORDER — DEXAMETHASONE SODIUM PHOSPHATE 10 MG/ML IJ SOLN
10.0000 mg | Freq: Once | INTRAMUSCULAR | Status: DC
  Filled 2021-02-27: qty 1

## 2021-02-27 MED ORDER — LIDOCAINE 2% (20 MG/ML) 5 ML SYRINGE
INTRAMUSCULAR | Status: DC | PRN
  Administered 2021-02-27: 60 mg via INTRAVENOUS

## 2021-02-27 SURGICAL SUPPLY — 74 items
ADH SKN CLS APL DERMABOND .7 (GAUZE/BANDAGES/DRESSINGS) ×2
APL SKNCLS STERI-STRIP NONHPOA (GAUZE/BANDAGES/DRESSINGS) ×2
BAG COUNTER SPONGE SURGICOUNT (BAG) ×2 IMPLANT
BAG SPNG CNTER NS LX DISP (BAG) ×1
BENZOIN TINCTURE PRP APPL 2/3 (GAUZE/BANDAGES/DRESSINGS) ×4 IMPLANT
BLADE CLIPPER SURG (BLADE) IMPLANT
BLADE EXTENDER LTP N26 DISP (ORTHOPEDIC DISPOSABLE SUPPLIES) ×1 IMPLANT
BONE MATRIX OSTEOCEL PRO LRG (Bone Implant) ×1 IMPLANT
BONE VIVIGEN FORMABLE 10CC (Bone Implant) ×2 IMPLANT
BUR CUTTER 7.0 ROUND (BURR) ×1 IMPLANT
BUR MATCHSTICK NEURO 3.0 LAGG (BURR) ×1 IMPLANT
CAP LOCKING THREADED (Cap) ×6 IMPLANT
CLAMP CONNECTOR 6.5-6.5MM (Clamp) ×2 IMPLANT
CLIP SPRING STIM LLIF SAFEOP (CLIP) ×1 IMPLANT
DERMABOND ADVANCED (GAUZE/BANDAGES/DRESSINGS) ×2
DERMABOND ADVANCED .7 DNX12 (GAUZE/BANDAGES/DRESSINGS) ×2 IMPLANT
DILATOR INSULATED LLIF 8-13-18 (NEUROSURGERY SUPPLIES) ×1 IMPLANT
DRAPE C-ARM 42X72 X-RAY (DRAPES) ×2 IMPLANT
DRAPE C-ARMOR (DRAPES) ×2 IMPLANT
DRAPE LAPAROTOMY 100X72X124 (DRAPES) ×2 IMPLANT
DRAPE SURG 17X23 STRL (DRAPES) ×4 IMPLANT
DRSG OPSITE POSTOP 3X4 (GAUZE/BANDAGES/DRESSINGS) ×3 IMPLANT
DRSG OPSITE POSTOP 4X8 (GAUZE/BANDAGES/DRESSINGS) ×1 IMPLANT
ELECT KIT SAFEOP SSEP/SURF (KITS) ×2
ELECT REM PT RETURN 9FT ADLT (ELECTROSURGICAL) ×2
ELECTRODE KT SAFEOP SSEP/SURF (KITS) IMPLANT
ELECTRODE REM PT RTRN 9FT ADLT (ELECTROSURGICAL) ×1 IMPLANT
EVACUATOR 1/8 PVC DRAIN (DRAIN) ×1 IMPLANT
GAUZE 4X4 16PLY ~~LOC~~+RFID DBL (SPONGE) IMPLANT
GAUZE SPONGE 4X4 12PLY STRL (GAUZE/BANDAGES/DRESSINGS) ×1 IMPLANT
GLOVE EXAM NITRILE XL STR (GLOVE) IMPLANT
GLOVE SURG ENC MOIS LTX SZ7 (GLOVE) IMPLANT
GLOVE SURG ENC MOIS LTX SZ8 (GLOVE) ×2 IMPLANT
GLOVE SURG UNDER LTX SZ8.5 (GLOVE) ×2 IMPLANT
GLOVE SURG UNDER POLY LF SZ7 (GLOVE) IMPLANT
GOWN STRL REUS W/ TWL LRG LVL3 (GOWN DISPOSABLE) IMPLANT
GOWN STRL REUS W/ TWL XL LVL3 (GOWN DISPOSABLE) ×1 IMPLANT
GOWN STRL REUS W/TWL 2XL LVL3 (GOWN DISPOSABLE) IMPLANT
GOWN STRL REUS W/TWL LRG LVL3 (GOWN DISPOSABLE)
GOWN STRL REUS W/TWL XL LVL3 (GOWN DISPOSABLE) ×2
GRAFT BNE MATRIX VG FRMBL L 10 (Bone Implant) IMPLANT
GUIDEWIRE LLIF TT 320 (WIRE) ×1 IMPLANT
HEMOSTAT POWDER KIT SURGIFOAM (HEMOSTASIS) ×2 IMPLANT
KIT BASIN OR (CUSTOM PROCEDURE TRAY) ×2 IMPLANT
KIT INFUSE MEDIUM (Orthopedic Implant) ×1 IMPLANT
KIT TURNOVER KIT B (KITS) ×2 IMPLANT
KNIFE ANNULOTOMY GREY RETRACT (ORTHOPEDIC DISPOSABLE SUPPLIES) ×1 IMPLANT
LIF ILLUMINATION SYSTEM STERIL (SYSTAGENIX WOUND MANAGEMENT) ×2
NDL HYPO 25X1 1.5 SAFETY (NEEDLE) ×1 IMPLANT
NEEDLE HYPO 25X1 1.5 SAFETY (NEEDLE) ×4 IMPLANT
NS IRRIG 1000ML POUR BTL (IV SOLUTION) ×5 IMPLANT
PACK LAMINECTOMY NEURO (CUSTOM PROCEDURE TRAY) ×2 IMPLANT
PROBE BALL TIP LLIF SAFEOP (NEUROSURGERY SUPPLIES) ×1 IMPLANT
ROD STRAIGHT 5.5X120 (Rod) ×2 IMPLANT
SCREW CORT CREO AMP 6.0-5.0X25 (Screw) ×2 IMPLANT
SCREW PA THRD CREO TULIP 5.5X4 (Head) ×6 IMPLANT
SHAFT CREO 30MM (Neuro Prosthesis/Implant) ×4 IMPLANT
SHIM INTRADISCAL LTP N DISP (ORTHOPEDIC DISPOSABLE SUPPLIES) ×1 IMPLANT
SHIM INTRADISCAL LTP W DISP (ORTHOPEDIC DISPOSABLE SUPPLIES) ×1 IMPLANT
SHIM INTRADISCAL PTP WIDE (ORTHOPEDIC DISPOSABLE SUPPLIES) ×1 IMPLANT
SPACER IDENTITI 8X18X45 0D (Spacer) ×1 IMPLANT
SPACER LIF IDENTITI 8X18X45 10 (Spacer) ×1 IMPLANT
SPONGE T-LAP 4X18 ~~LOC~~+RFID (SPONGE) IMPLANT
STRIP CLOSURE SKIN 1/2X4 (GAUZE/BANDAGES/DRESSINGS) ×3 IMPLANT
SUT VIC AB 0 CT1 18XCR BRD8 (SUTURE) IMPLANT
SUT VIC AB 0 CT1 8-18 (SUTURE) ×2
SUT VIC AB 2-0 CT1 18 (SUTURE) ×5 IMPLANT
SUT VIC AB 4-0 PS2 27 (SUTURE) ×1 IMPLANT
SUT VICRYL 4-0 PS2 18IN ABS (SUTURE) ×4 IMPLANT
SYSTEM ILLUMINATION LIF STERIL (SYSTAGENIX WOUND MANAGEMENT) IMPLANT
TOWEL GREEN STERILE (TOWEL DISPOSABLE) ×2 IMPLANT
TOWEL GREEN STERILE FF (TOWEL DISPOSABLE) ×2 IMPLANT
TRAY FOLEY MTR SLVR 16FR STAT (SET/KITS/TRAYS/PACK) ×2 IMPLANT
WATER STERILE IRR 1000ML POUR (IV SOLUTION) ×2 IMPLANT

## 2021-02-27 NOTE — Transfer of Care (Signed)
Immediate Anesthesia Transfer of Care Note  Patient: LILANDRA PALOMEQUE  Procedure(s) Performed: Anterior lateral interbody fusion - lateral two - lateral three - lateral three - lateral four with exploration fusion L4-S1 Posterior augmentation with pedicle screws Latreal one - lateral four - Posterior Lateral and Interbody fusion  Patient Location: PACU  Anesthesia Type:General  Level of Consciousness: drowsy and responds to stimulation  Airway & Oxygen Therapy: Patient Spontanous Breathing and Patient connected to face mask oxygen  Post-op Assessment: Report given to RN and Post -op Vital signs reviewed and stable  Post vital signs: Reviewed and stable  Last Vitals:  Vitals Value Taken Time  BP 107/58 02/27/21 1358  Temp    Pulse 86 02/27/21 1404  Resp 17 02/27/21 1404  SpO2 99 % 02/27/21 1404  Vitals shown include unvalidated device data.  Last Pain:  Vitals:   02/27/21 0709  TempSrc: Oral  PainSc:       Patients Stated Pain Goal: 5 ([id number])  Complications: No notable events documented.

## 2021-02-27 NOTE — Anesthesia Procedure Notes (Signed)
Procedure Name: Intubation Date/Time: 02/27/2021 8:47 AM Performed by: Janene Harvey, CRNA Pre-anesthesia Checklist: Patient identified, Emergency Drugs available, Suction available and Patient being monitored Patient Re-evaluated:Patient Re-evaluated prior to induction Oxygen Delivery Method: Circle system utilized Preoxygenation: Pre-oxygenation with 100% oxygen Induction Type: IV induction Ventilation: Mask ventilation without difficulty and Oral airway inserted - appropriate to patient size Laryngoscope Size: Mac and 4 Grade View: Grade I Tube type: Oral Tube size: 7.0 mm Number of attempts: 1 Airway Equipment and Method: Stylet and Oral airway Placement Confirmation: ETT inserted through vocal cords under direct vision, positive ETCO2 and breath sounds checked- equal and bilateral Secured at: 21 cm Tube secured with: Tape Dental Injury: Teeth and Oropharynx as per pre-operative assessment

## 2021-02-27 NOTE — Progress Notes (Signed)
Orthopedic Tech Progress Note Patient Details:  Janice Pace 09-Jan-1946 [id number]  Patient has brace   Patient ID: Janice Pace, female   DOB: 12-Aug-1945, 75 y.o.   MRN: [id number]  Janice Pace 02/27/2021, 4:04 PM

## 2021-02-27 NOTE — Op Note (Signed)
Preoperative diagnosis: Degenerative disc disease and foraminal stenosis L2-3 L3-4 and L1-L2 instability L3-4 with segmental degeneration above her previous fusion from L4-S1  Postoperative diagnosis: Same  Procedure: #1: Anterior lateral interbody fusions L2-3 L3-4 utilizing utilizing the Alphatec titanium cages packed with ostia cell pro and utilizing intraoperative neuro monitoring and fluoroscopy  2.:  After repositioning posterior augmentation with cortical screws from L1-L4 utilizing the globus Creo amp modular cortical screw set tying in with the addition set to a previous 6.35 Revere globus pedicle screw set  3.  Posterior lateral arthrodesis L1 L4 utilizing vivigen and BMP  Surgeon: Janice Pace  Assistant: Ashok Pall  Anesthesia: General  EBL: Minimal  HPI: 75 year old female with longstanding issues with her back had previously undergone L4-S1 fusion presented with progressive worsening back pain bilateral leg pain worse on the right work-up revealed segmental instability with motion on dynamic films at L3-4 severe degenerative disc disease at L1-L2 and L2-3.  Due to patient's progressive clinical syndrome imaging findings and failed conservative treatment I recommended posterior lateral and interbody fusion from L1-L4 utilizing an anterior lateral approach for cages at L2-3 and L3-4 and posterior lateral arthrodesis at L1-L2.  I extensively went over the risks and benefits of that operation with her as well as perioperative course expectations of outcome and alternatives to surgery and she understood and agreed to proceed forward.  Operative procedure: For the first day of the operation patient was induced under general anesthesia without paralytics positioned left side up with the back slightly flexed knees flexed exposing the left lateral abdominal wall 3 incision were drawn out under fluoroscopy centered around the L2-3 L3-4 disc bases with a posterior incision to gain access to the  retroperitoneal space.  And after prepping and draping all 3 incisions were infiltrated and incised and utilized in the posterior incision I palpated the transverse process of L3 swept up through the and developed the retroperitoneal space and passed the dilator through the lateral incision with my finger down to the L3-4 disc base utilizing fluoroscopy to dock it.  Several repositions were carried out secondary to multiple readings that were somewhat all over the map.  There was a fair amount of artifact and interference that would intermittently give [place] a low reading but then were correct to a high reading.  But I selected a safe zone with readings never reading under 16 at L3-4 dilated it placed the retractor docked everything in place and safe zones after monitoring in a 306 degree orientation and fluoroscopy confirmed good position.  I then incised the disc cleaned out the disc and prepared the endplates and selected a lordotic implant for L3-4 that was 8 posteriorly 11 anteriorly and after packing it with the ostia cell pro inserted that under fluoroscopy utilizing slides.  Again monitoring confirmed a safe zone during the entire with interbody work at that level.  After good positioning of that implant was achieved I then remove the retractor and passed new through a new incision the dilator over L2-3 in a similar fashion docked at with appropriate spacing monitoring never gave me a reading under 20 utilizing sequential dilation and then placing the retractor anchoring everything in place again cleaned out the disc base prepared the endplates and inserted the interbody implant.  Again neuro monitoring never gave [place] anything under 20 and fluoroscopy confirmed good position of the implants.  All 3 incisions were then closed with interrupted Vicryl x-ray confirmed no retention of implants and then we reposition  the patient prone for the second stage of the procedure.  After repositioning of the Macchia frame her  back was prepped and draped in routine sterile fashion infiltration of 10 cc lidocaine of the midline a midline incision was made subperiosteal dissection was carried out exposing from essentially T12 down to L5 and above below L5 identified the cross-link and the screws and the rods between L4-5 I also identified all the cortical screw entry points at L1-L2 and L3 respectively.  Then under fluoroscopy placed cortical screws at L1, L2, and L3 bilaterally all screws had excellent purchase and confirmed good position with fluoroscopy.  Then I dissected out the rod remove the cross-link create a space around the rod and utilizing a conversion connector [id number] I placed the 2 connectors between the rod at L4-5 then assembled the heads and aggressively decorticated the pars lamina and facet complexes from L1 L4 lay down extensive mount of BMP and  Vivigen.  Then passed the rod through the connector into the screw heads and anchored everything in place.  Placed an additional bone graft along the undersurface of the rod and posterior laterally injected Exparel in the fascia placed a medium Hemovac drain and closed the wound in layers with active Vicryl skin was closed running 4 subcuticular Dermabond benzoin Steri-Strips and sterile dressing was applied patient recovery in stable condition.  At the end the case all needle count sponge counts were correct.

## 2021-02-27 NOTE — Anesthesia Preprocedure Evaluation (Signed)
Anesthesia Evaluation  Patient identified by MRN, date of birth, ID band Patient awake    Reviewed: Allergy & Precautions, NPO status , Patient's Chart, lab work & pertinent test results  Airway Mallampati: II  TM Distance: >3 FB Neck ROM: Full    Dental no notable dental hx.    Pulmonary neg pulmonary ROS, former smoker,    Pulmonary exam normal breath sounds clear to auscultation       Cardiovascular negative cardio ROS   Rhythm:Regular Rate:Normal + Systolic murmurs    Neuro/Psych negative neurological ROS  negative psych ROS   GI/Hepatic Neg liver ROS, GERD  ,  Endo/Other  negative endocrine ROS  Renal/GU negative Renal ROS  negative genitourinary   Musculoskeletal negative musculoskeletal ROS (+)   Abdominal   Peds negative pediatric ROS (+)  Hematology negative hematology ROS (+)   Anesthesia Other Findings   Reproductive/Obstetrics negative OB ROS                             Anesthesia Physical Anesthesia Plan  ASA: 2  Anesthesia Plan: General   Post-op Pain Management:    Induction: Intravenous  PONV Risk Score and Plan: 3 and Ondansetron, Dexamethasone and Treatment may vary due to age or medical condition  Airway Management Planned: Oral ETT  Additional Equipment:   Intra-op Plan:   Post-operative Plan: Extubation in OR  Informed Consent: I have reviewed the patients History and Physical, chart, labs and discussed the procedure including the risks, benefits and alternatives for the proposed anesthesia with the patient or authorized representative who has indicated his/her understanding and acceptance.     Dental advisory given  Plan Discussed with: CRNA and Surgeon  Anesthesia Plan Comments:         Anesthesia Quick Evaluation

## 2021-02-27 NOTE — Anesthesia Postprocedure Evaluation (Signed)
Anesthesia Post Note  Patient: Janice Pace  Procedure(s) Performed: Anterior lateral interbody fusion - lateral two - lateral three - lateral three - lateral four with exploration fusion L4-S1 Posterior augmentation with pedicle screws Latreal one - lateral four - Posterior Lateral and Interbody fusion     Patient location during evaluation: PACU Anesthesia Type: General Level of consciousness: awake and alert Pain management: pain level controlled Vital Signs Assessment: post-procedure vital signs reviewed and stable Respiratory status: spontaneous breathing, nonlabored ventilation, respiratory function stable and patient connected to nasal cannula oxygen Cardiovascular status: blood pressure returned to baseline and stable Postop Assessment: no apparent nausea or vomiting Anesthetic complications: no   No notable events documented.  Last Vitals:  Vitals:   02/27/21 1530 02/27/21 1602  BP: 115/72 122/65  Pulse: 82 77  Resp: 14 16  Temp:    SpO2: 95% 96%    Last Pain:  Vitals:   02/27/21 1640  TempSrc:   PainSc: 10-Worst pain ever    LLE Motor Response: Purposeful movement (02/27/21 1640) LLE Sensation: Full sensation (02/27/21 1640) RLE Motor Response: Purposeful movement (02/27/21 1640) RLE Sensation: Full sensation (02/27/21 1640)      Sayda Grable S

## 2021-02-27 NOTE — Progress Notes (Signed)
PHARMACIST - PHYSICIAN ORDER COMMUNICATION  CONCERNING: P&T Medication Policy on Herbal Medications  DESCRIPTION:  This patient's order for:  Black Cherry Concentrate  have been noted.  This product(s) is classified as an "herbal" or natural product. Due to a lack of definitive safety studies or FDA approval, nonstandard manufacturing practices, plus the potential risk of unknown drug-drug interactions while on inpatient medications, the Pharmacy and Therapeutics Committee does not permit the use of "herbal" or natural products of this type within [facility].   ACTION TAKEN: The pharmacy department is unable to verify this order at this time. Please reevaluate patient's clinical condition at discharge and address if the herbal or natural product(s) should be resumed at that time.  Arty Baumgartner, [place] 02/27/2021 5:13 PM

## 2021-02-27 NOTE — H&P (Signed)
Janice Pace is an 75 y.o. female.   Chief Complaint: Back and right greater than left leg pain HPI: 75 year old female with longstanding issues with her back previous L4-S1 fusion presents with progressive worsening back and bilateral hip and leg pain worse on the right.  Patient had evidence of L3-L4 radicular symptoms now she is having a lot of posterior thigh pain work-up is revealed severe degenerative disc disease primarily at L2-3 and L3-4 moderate to severe collapse at L1-L2 but no stenosis at that level.  Due to patient's progression of clinical syndrome imaging findings and failed conservative treatment with imaging including flexion-extension shows instability at L3-4 I recommended anterior lateral interbody fusions at L2-3 and L3-4 as well as posterior screw augmentation from L1-L4 tying into her old construct.  I have extensively gone over the risks and benefits of that procedure with her as well as perioperative course expectations of outcome and alternatives of surgery and she understands and agrees to proceed forward.  Past Medical History:  Diagnosis Date   Arthritis    Bronchitis    Cancer ([place])    dermatosivros arcoma and protuverans   Chronic back pain    degenerative disc disease   Constipation    stool softener daily   Constipation    GERD (gastroesophageal reflux disease)    takes Prilosec daily   GERD (gastroesophageal reflux disease)    Heart murmur    Heart valve disorder    Hemorrhoids    History of hiatal hernia    Insomnia    d/t chantix   Joint pain    Nocturia    Personal history of colonic adenoma 03/13/2003   03/13/2003 - 5 mm adenoma   Pre-diabetes    Prediabetes     Past Surgical History:  Procedure Laterality Date   ABDOMINAL HYSTERECTOMY     BACK SURGERY  1988   BACK SURGERY  09-03-11   rods,screws x 8    BUNIONECTOMY     bilateral   BUNIONECTOMY     bil feet   COLONOSCOPY     dermatosibrosarcoma protuberans     PARTIAL HYSTERECTOMY      RECTOCELE REPAIR     Rectum Repair     RESECTION TUMOR WRIST RADICAL     x 2 rt   TUBAL LIGATION  1974   TUBAL LIGATION     WRIST SURGERY     left    Family History  Problem Relation Age of Onset   Cancer Mother    Anesthesia problems Neg Hx    Hypotension Neg Hx    Malignant hyperthermia Neg Hx    Pseudochol deficiency Neg Hx    Colon cancer Neg Hx    Social History:  reports that she quit smoking about 9 years ago. Her smoking use included cigarettes. She has a 50.00 pack-year smoking history. She has never used smokeless tobacco. She reports that she does not drink alcohol and does not use drugs.  Allergies:  Allergies  Allergen Reactions   Sulfa Antibiotics Hives and Itching   Atorvastatin Other (See Comments)    mucsle aches   Metformin Hcl Other (See Comments)    joint pains and muscle aches    Medications Prior to Admission  Medication Sig Dispense Refill   acetaminophen (TYLENOL) 500 MG tablet Take 1,000 mg by mouth every 6 (six) hours as needed for moderate pain or headache.     ascorbic acid (VITAMIN C) 1000 MG tablet Take 1,000 mg by  mouth daily.     aspirin EC 81 MG tablet Take 81 mg by mouth daily. Swallow whole.     Calcium Carb-Cholecalciferol (CALCIUM 600 + D PO) Take 2 tablets by mouth 3 (three) times a week.     cyclobenzaprine (FLEXERIL) 5 MG tablet Take 5 mg by mouth See admin instructions. Take 5 mg at bedtime, may take a second 5 mg dose in the middle of the night as needed for spasms     docusate sodium (COLACE) 100 MG capsule Take 500 mg by mouth at bedtime.     fexofenadine (ALLEGRA) 180 MG tablet Take 180 mg by mouth daily as needed for allergies or rhinitis.     gabapentin (NEURONTIN) 300 MG capsule Take 1-2 capsules (300-600 mg total) by mouth 3 (three) times daily as needed (Nerve pain). 180 capsule 2   HYDROcodone-acetaminophen (NORCO/VICODIN) 5-325 MG tablet Take 1 tablet by mouth every 6 (six) hours as needed. (Patient taking differently:  Take 1 tablet by mouth at bedtime as needed for moderate pain.) 15 tablet 0   ibuprofen (ADVIL) 200 MG tablet Take 400 mg by mouth every 6 (six) hours as needed for headache or moderate pain.     Misc Natural Products (BLACK CHERRY CONCENTRATE PO) Take 2 capsules by mouth daily.     Multiple Vitamins-Minerals (HAIR/SKIN/NAILS) CAPS Take 2 capsules by mouth daily.     omeprazole (PRILOSEC) 20 MG capsule Take 20 mg by mouth daily before breakfast.     rosuvastatin (CRESTOR) 10 MG tablet Take 1 tablet (10 mg total) by mouth daily. 90 tablet 0   albuterol (VENTOLIN HFA) 108 (90 Base) MCG/ACT inhaler Inhale 1-2 puffs into the lungs every 6 (six) hours as needed for wheezing or shortness of breath.      Results for orders placed or performed during the hospital encounter of 02/27/21 (from the past 48 hour(s))  Glucose, capillary     Status: Abnormal   Collection Time: 02/27/21  7:05 AM  Result Value Ref Range   Glucose-Capillary 146 (H) 70 - 99 mg/dL    Comment: Glucose reference range applies only to samples taken after fasting for at least 8 hours.  ABO/Rh     Status: None (Preliminary result)   Collection Time: 02/27/21  7:40 AM  Result Value Ref Range   ABO/RH(D) PENDING    No results found.  Review of Systems  Musculoskeletal:  Positive for back pain.  Neurological:  Positive for numbness.   Blood pressure (!) 153/73, pulse 87, temperature 98.1 F (36.7 C), temperature source Oral, resp. rate 18, height '5\' 2"'$  (1.575 m), weight 87.5 kg, SpO2 94 %. Physical Exam HENT:     Head: Normocephalic.     Right Ear: Tympanic membrane normal.     Nose: Nose normal.  Eyes:     Pupils: Pupils are equal, round, and reactive to light.  Cardiovascular:     Rate and Rhythm: Normal rate.  Pulmonary:     Effort: Pulmonary effort is normal.  Abdominal:     General: Abdomen is flat.  Musculoskeletal:        General: Normal range of motion.  Skin:    General: Skin is warm.  Neurological:      Mental Status: She is alert.     Comments: Is awake alert strength is 5-5 iliopsoas, quads, hamstrings, gastroc, tibialis, and EHL.     Assessment/Plan 75 year old presents for anterior lateral interbody fusions L2-3 L3-4 posterior screw augmentation L1 L4.  Dominica Severin  Levy Sjogren, MD 02/27/2021, 8:21 AM

## 2021-02-28 MED ORDER — HYDROMORPHONE HCL 1 MG/ML IJ SOLN
0.5000 mg | INTRAMUSCULAR | Status: DC | PRN
  Administered 2021-02-28: 1 mg via INTRAVENOUS
  Filled 2021-02-28: qty 1

## 2021-02-28 MED ORDER — DIPHENHYDRAMINE HCL 25 MG PO CAPS
25.0000 mg | ORAL_CAPSULE | Freq: Four times a day (QID) | ORAL | Status: DC | PRN
  Administered 2021-02-28: 25 mg via ORAL
  Filled 2021-02-28: qty 1

## 2021-02-28 MED ORDER — METHOCARBAMOL 750 MG PO TABS
750.0000 mg | ORAL_TABLET | Freq: Four times a day (QID) | ORAL | Status: DC | PRN
  Administered 2021-02-28: 750 mg via ORAL
  Filled 2021-02-28 (×2): qty 1

## 2021-02-28 NOTE — Evaluation (Signed)
Physical Therapy Evaluation Patient Details Name: Janice Pace MRN: [id number] DOB: 08/10/1945 Today's Date: 02/28/2021   History of Present Illness  Pt is a 75 y/o female presenting with radicular lumbar symptoms s/p elective L2-L4 ALIF on 02/27/2021. PMHx significant for several back surgeries including previous L4-S1 fusion in 2013, CA, heart murmur and valve disorder, pre-diabetes.  Clinical Impression  Pt admitted with above diagnosis. At the time of PT eval, pt was able to demonstrate transfers and ambulation with gross min guard assist to min assist and RW for support. Pt motivated for distance, however cautioned pt to not push herself too much as she reports poor pain control overnight and this morning. Pt was educated on precautions, brace application/wearing schedule, appropriate activity progression, and car transfer. Pt currently with functional limitations due to the deficits listed below (see PT Problem List). Pt will benefit from skilled PT to increase their independence and safety with mobility to allow discharge to the venue listed below.      Follow Up Recommendations Home health PT;Supervision for mobility/OOB    Equipment Recommendations  Rolling walker with 5" wheels;3in1 (PT)    Recommendations for Other Services       Precautions / Restrictions Precautions Precautions: Back;Fall Precaution Booklet Issued: Yes (comment) Precaution Comments: Reviewed handout and pt was cued for precautions during functional mobility. Required Braces or Orthoses: Spinal Brace Spinal Brace: Lumbar corset;Applied in sitting position Restrictions Weight Bearing Restrictions: No      Mobility  Bed Mobility Overal bed mobility: Needs Assistance Bed Mobility: Rolling;Sit to Sidelying Rolling: Modified independent (Device/Increase time)       Sit to sidelying: Min guard General bed mobility comments: Increased time to elevate LE's up into bed at end of session. VC's for log roll  technique. Required use of railing for support.    Transfers Overall transfer level: Needs assistance Equipment used: 1 person hand held assist;Rolling walker (2 wheeled) Transfers: Sit to/from Stand Sit to Stand: Min guard         General transfer comment: Min guard for steadying; increased time to rise and cues for hand placement. RW at end of session and VC's to keep walker close until ready to sit.  Ambulation/Gait Ambulation/Gait assistance: Min assist;Min guard Gait Distance (Feet): 50 Feet (x3) Assistive device: 1 person hand held assist;Rolling walker (2 wheeled) Gait Pattern/deviations: Step-through pattern;Decreased stride length;Trunk flexed Gait velocity: Decreased Gait velocity interpretation: <1.8 ft/sec, indicate of risk for recurrent falls General Gait Details: Slow and guarded with flexed trunk. Initially reaching out for external support even with HHA. Transitioned to the RW and pt was able to progress to min guard assist.  Stairs            Wheelchair Mobility    Modified Rankin (Stroke Patients Only)       Balance Overall balance assessment: Needs assistance Sitting-balance support: Single extremity supported;Feet supported Sitting balance-Leahy Scale: Fair     Standing balance support: Single extremity supported;During functional activity Standing balance-Leahy Scale: Poor Standing balance comment: Reliant on at least unilateral UE support.                             Pertinent Vitals/Pain Pain Assessment: Faces Faces Pain Scale: Hurts whole lot Pain Location: Low back (incisional) Pain Descriptors / Indicators: Sore;Grimacing;Operative site guarding Pain Intervention(s): Limited activity within patient's tolerance;Monitored during session;Repositioned    Home Living Family/patient expects to be discharged to:: Private residence Living Arrangements:  Spouse/significant other Available Help at Discharge: Family;Available 24  hours/day Type of Home: House Home Access: Stairs to enter Entrance Stairs-Rails: Left Entrance Stairs-Number of Steps: 1 Home Layout: One level Home Equipment: Other (comment);Cane - single point Engineer, site)      Prior Function Level of Independence: Independent               Hand Dominance   Dominant Hand: Right    Extremity/Trunk Assessment   Upper Extremity Assessment Upper Extremity Assessment: Defer to OT evaluation    Lower Extremity Assessment Lower Extremity Assessment: Generalized weakness (Consistent with pre-op diagnosis)    Cervical / Trunk Assessment Cervical / Trunk Assessment: Other exceptions Cervical / Trunk Exceptions: s/p spinal surgery  Communication   Communication: No difficulties  Cognition Arousal/Alertness: Awake/alert Behavior During Therapy: WFL for tasks assessed/performed Overall Cognitive Status: Impaired/Different from baseline                                        General Comments      Exercises     Assessment/Plan    PT Assessment Patient needs continued PT services  PT Problem List Decreased strength;Decreased activity tolerance;Decreased balance;Decreased mobility;Decreased knowledge of use of DME;Decreased safety awareness;Decreased knowledge of precautions;Pain       PT Treatment Interventions DME instruction;Gait training;Functional mobility training;Therapeutic activities;Therapeutic exercise;Neuromuscular re-education;Patient/family education    PT Goals (Current goals can be found in the Care Plan section)  Acute Rehab PT Goals Patient Stated Goal: To decrease pain PT Goal Formulation: With patient/family Time For Goal Achievement: 03/07/21 Potential to Achieve Goals: Good    Frequency Min 5X/week   Barriers to discharge        Co-evaluation               AM-PAC PT "6 Clicks" Mobility  Outcome Measure Help needed turning from your back to your side while in a flat bed without using  bedrails?: None Help needed moving from lying on your back to sitting on the side of a flat bed without using bedrails?: A Little Help needed moving to and from a bed to a chair (including a wheelchair)?: A Little Help needed standing up from a chair using your arms (e.g., wheelchair or bedside chair)?: A Little Help needed to walk in hospital room?: A Little Help needed climbing 3-5 steps with a railing? : A Little 6 Click Score: 19    End of Session Equipment Utilized During Treatment: Gait belt;Back brace Activity Tolerance: Patient limited by pain;Patient limited by fatigue Patient left: in bed;with call bell/phone within reach;with family/visitor present Nurse Communication: Mobility status PT Visit Diagnosis: Unsteadiness on feet (R26.81);Pain Pain - part of body:  (back)    Time: 1137-1203 PT Time Calculation (min) (ACUTE ONLY): 26 min   Charges:   PT Evaluation $PT Eval Low Complexity: 1 Low PT Treatments $Gait Training: 8-22 mins        Rolinda Roan, PT, DPT Acute Rehabilitation Services Pager: [phone] Office: [phone]   Thelma Comp 02/28/2021, 2:02 PM

## 2021-02-28 NOTE — TOC Progression Note (Signed)
Transition of Care [facility]) - Progression Note    Patient Details  Name: Janice Pace MRN: [id number] Date of Birth: 1945-08-07  Transition of Care [facility]) CM/SW [place], RN Phone Number:[phone]  02/28/2021, 4:13 PM  Clinical Narrative:    [facility] consulted for [facility] needs. CM at bedside to offer choice. Home health has been set up with [place]. Info has been added to AVS and patient has been updated. No other needs noted right now. TOC will continue to follow.      Barriers to Discharge: Continued Medical Work up  Expected Discharge Plan and Services                           DME Arranged: N/A DME Agency: NA       HH Arranged: PT, OT [place] Agency: [place] (now [facility]) Date [place]: 02/28/21 Time [place]: [phone] Representative spoke with at [place]: [place] ([place]) Interventions    Readmission Risk Interventions No flowsheet data found.

## 2021-02-28 NOTE — Progress Notes (Signed)
Subjective: Patient reports  doing well no leg pain condition of back pain  Objective: Vital signs in last 24 hours: Temp:  [97.7 F (36.5 C)-98.8 F (37.1 C)] 97.8 F (36.6 C) (08/18 0744) Pulse Rate:  [77-90] 85 (08/18 0744) Resp:  [14-20] 18 (08/18 0744) BP: (107-139)/(58-72) 138/72 (08/18 0744) SpO2:  [95 %-100 %] 99 % (08/18 0744)  Intake/Output from previous day: 08/17 0701 - 08/18 0700 In: 2540 [P.O.:240; I.V.:1950; IV Piggyback:350] Out: 1370 [Urine:1150; Drains:120; Blood:100] Intake/Output this shift: No intake/output data recorded.  Awake alert strength 5-5 incision clean dry and intact  Lab Results: No results for input(s): WBC, HGB, HCT, PLT in the last 72 hours. BMET No results for input(s): NA, K, CL, CO2, GLUCOSE, BUN, CREATININE, CALCIUM in the last 72 hours.  Studies/Results: DG Lumbar Spine 2-3 Views  Result Date: 02/27/2021 CLINICAL DATA:  Elective surgery Z41.9 (ICD-10-CM). Additional history provided by technologist: L1-3 pedicle screws (previous L2-4 XLIF). Provided fluoroscopy time 1 minutes, 7 seconds (54.54 mGy). EXAM: LUMBAR SPINE - 2-3 VIEW; DG C-ARM 1-60 MIN COMPARISON:  Lumbar spine radiograph 12/18/2020. FINDINGS: AP and lateral view intraoperative fluoroscopic images of the lumbar spine are submitted, 2 images total. Per the preoperative H&P, the pre-existing posterior spinal fusion construct spans the L4-S1 levels. Spinal numbering will remain consistent with this numbering scheme. On today's examination, there is a partially imaged posterior spinal fusion construct extending caudally from the L4 level. Bilateral pedicle screws are present at L1, L2 and L3. Vertical interconnecting rods were not present at the time the images were taken. Overlying retractors. Interbody devices are now present at L2-L3 and L3-L4. Redemonstrated interbody devices at L4-L5. Correlate with the operative history. IMPRESSION: Two intraoperative fluoroscopic images of the  lumbar spine, as described. Electronically Signed   By: Kellie Simmering D.O.   On: 02/27/2021 13:17   DG Lumbar Spine 2-3 Views  Result Date: 02/27/2021 CLINICAL DATA:  L2-4 XLIF.  Evaluate for foreign body. EXAM: LUMBAR SPINE - 2-3 VIEW COMPARISON:  Radiographs 12/18/2020.  CT 06/27/2020. FINDINGS: PA and lateral (2) spot fluoroscopic images of the mid lumbar spine are submitted. 5 lumbar type vertebral bodies are assumed with hypoplastic ribs at T12 and previous L4 through S1 PLIF. Surgical instruments are in place status post XLIF at the L2-3 and L3-4 levels. No unexpected foreign bodies or complications are identified. IMPRESSION: Intraoperative views during L2 through 4 XLIF. No demonstrated complication or unexpected foreign body. These results were called by telephone at the time of interpretation on 02/27/2021 at 11:20 am to representative of provider Kary Kos in the operating room, who verbally acknowledged these results. Electronically Signed   By: Richardean Sale M.D.   On: 02/27/2021 11:24   DG C-Arm 1-60 Min  Result Date: 02/27/2021 CLINICAL DATA:  Elective surgery Z41.9 (ICD-10-CM). Additional history provided by technologist: L1-3 pedicle screws (previous L2-4 XLIF). Provided fluoroscopy time 1 minutes, 7 seconds (54.54 mGy). EXAM: LUMBAR SPINE - 2-3 VIEW; DG C-ARM 1-60 MIN COMPARISON:  Lumbar spine radiograph 12/18/2020. FINDINGS: AP and lateral view intraoperative fluoroscopic images of the lumbar spine are submitted, 2 images total. Per the preoperative H&P, the pre-existing posterior spinal fusion construct spans the L4-S1 levels. Spinal numbering will remain consistent with this numbering scheme. On today's examination, there is a partially imaged posterior spinal fusion construct extending caudally from the L4 level. Bilateral pedicle screws are present at L1, L2 and L3. Vertical interconnecting rods were not present at the time the images were taken.  Overlying retractors. Interbody devices  are now present at L2-L3 and L3-L4. Redemonstrated interbody devices at L4-L5. Correlate with the operative history. IMPRESSION: Two intraoperative fluoroscopic images of the lumbar spine, as described. Electronically Signed   By: Kellie Simmering D.O.   On: 02/27/2021 13:17    Assessment/Plan: Postop day 1 anterior lateral interbody fusions L2-3 L3-4 with posterior segmental fixation L1-L4 doing very well mobilize in physical Occupational Therapy observe 1 more day discharge probably tomorrow  LOS: 1 day     Janice Pace 02/28/2021, 7:53 AM

## 2021-02-28 NOTE — Evaluation (Signed)
Occupational Therapy Evaluation Patient Details Name: Janice Pace MRN: [id number] DOB: 11/21/1945 Today's Date: 02/28/2021    History of Present Illness 75 y.o. female presenting with radicular lumbar symptoms s/p elective L2-3 and L3-4 ALIF on 8/17. PMHx significant for several back surgeries including previous L4-S1 fusion in 2013 and lumbago.   Clinical Impression   PTA patient was living with her spouse in a 1-level private residence and was grossly independent with ADLs/IADLs without AD. Patient currently functioning below baseline requiring Min to Mod A grossly for ADLs and Min guard for functional transfers and short-distance functional mobility in hospital room OT provided written and verbal education to patient/family on spinal precautions, home set-up to maximize safety and independence with self-care tasks, and acquisition/use of AE. Patient expressed verbal understanding but would benefit from continued education. OT will continue to follow acutely.     Follow Up Recommendations  Home health OT;Supervision/Assistance - 24 hour    Equipment Recommendations  3 in 1 bedside commode;Other (comment) (rolling walker)    Recommendations for Other Services       Precautions / Restrictions Precautions Precautions: Back;Fall Precaution Booklet Issued: Yes (comment) Precaution Comments: Written and verbal education provided; requires cues for adherence during ADLs. Required Braces or Orthoses: Spinal Brace Spinal Brace: Lumbar corset;Applied in sitting position Restrictions Weight Bearing Restrictions: No      Mobility Bed Mobility               General bed mobility comments: Patient transferring to commode with nursing staff upon entry.    Transfers Overall transfer level: Needs assistance Equipment used: 1 person hand held assist Transfers: Sit to/from Stand Sit to Stand: Min guard         General transfer comment: Min guard for steadying; increased time to  rise and cues for hand placement.    Balance Overall balance assessment: Needs assistance Sitting-balance support: Single extremity supported;Feet supported Sitting balance-Leahy Scale: Fair Sitting balance - Comments: Did not challenge   Standing balance support: Single extremity supported;During functional activity Standing balance-Leahy Scale: Poor Standing balance comment: Reliant on at least unilateral UE support.                           ADL either performed or assessed with clinical judgement   ADL Overall ADL's : Needs assistance/impaired     Grooming: Wash/dry hands;Standing;Min guard Grooming Details (indicate cue type and reason): 1/3 grooming tasks standing at sink level with Min guard.             Lower Body Dressing: Moderate assistance;Sit to/from stand Lower Body Dressing Details (indicate cue type and reason): Unable to attain/maintain figure-4 position requiring external assist to adjust footwear. Toilet Transfer: Magazine features editor Details (indicate cue type and reason): Min gaurd and HHA to standard height commode with use of grab bar. Toileting- Water quality scientist and Hygiene: Min guard;Sit to/from stand Toileting - Clothing Manipulation Details (indicate cue type and reason): Min guard for steadying in standing.     Functional mobility during ADLs: Min guard       Vision         Perception     Praxis      Pertinent Vitals/Pain Pain Assessment: 0-10 Pain Score: 8  Pain Location: Low back (incisional) Pain Descriptors / Indicators: Sore;Grimacing;Guarding Pain Intervention(s): Limited activity within patient's tolerance;Monitored during session;Repositioned;Patient requesting pain meds-RN notified     Hand Dominance Right   Extremity/Trunk Assessment Upper  Extremity Assessment Upper Extremity Assessment: Generalized weakness   Lower Extremity Assessment Lower Extremity Assessment: Defer to PT evaluation    Cervical / Trunk Assessment Cervical / Trunk Assessment: Other exceptions Cervical / Trunk Exceptions: s/p spinal surgery   Communication Communication Communication: No difficulties   Cognition Arousal/Alertness: Awake/alert Behavior During Therapy: WFL for tasks assessed/performed Overall Cognitive Status: Impaired/Different from baseline                                 General Comments: Increased time required to process verbal information   General Comments  Clean, dry dressing at incision. Husband present at bedside.    Exercises     Shoulder Instructions      Home Living Family/patient expects to be discharged to:: Private residence Living Arrangements: Spouse/significant other Available Help at Discharge: Family;Available 24 hours/day Type of Home: House Home Access: Stairs to enter [employer] of Steps: 1 Entrance Stairs-Rails: Left Home Layout: One level     Bathroom Shower/Tub: Occupational psychologist: Standard     Home Equipment: Other (comment) (Bidet; SPC vs hurry cane; has her mothers old RW but it is fairly worn. Would benefit from a new one.)          Prior Functioning/Environment Level of Independence: Independent                 OT Problem List: Decreased strength;Impaired balance (sitting and/or standing);Decreased knowledge of use of DME or AE;Pain      OT Treatment/Interventions: Self-care/ADL training;Therapeutic exercise;Energy conservation;DME and/or AE instruction;Therapeutic activities;Patient/family education;Balance training    OT Goals(Current goals can be found in the care plan section) Acute Rehab OT Goals Patient Stated Goal: To decrease pain OT Goal Formulation: With patient Time For Goal Achievement: 03/14/21 Potential to Achieve Goals: Good ADL Goals Pt Will Perform Grooming: with modified independence;standing Pt Will Perform Upper Body Dressing: with modified  independence;sitting Pt Will Perform Lower Body Dressing: with modified independence;sit to/from stand;with adaptive equipment Pt Will Transfer to Toilet: with modified independence;ambulating Pt Will Perform Toileting - Clothing Manipulation and hygiene: with modified independence;sit to/from stand Pt Will Perform Tub/Shower Transfer: Shower transfer;3 in 1 Additional ADL Goal #1: Patient will recall 3/3 spinal precautions without cues, demonstrating good return demo during ADLs.  OT Frequency: Min 2X/week   Barriers to D/C:            Co-evaluation              AM-PAC OT "6 Clicks" Daily Activity     Outcome Measure Help from another person eating meals?: None Help from another person taking care of personal grooming?: A Little Help from another person toileting, which includes using toliet, bedpan, or urinal?: A Little Help from another person bathing (including washing, rinsing, drying)?: A Little Help from another person to put on and taking off regular upper body clothing?: A Little Help from another person to put on and taking off regular lower body clothing?: A Lot 6 Click Score: 18   End of Session Equipment Utilized During Treatment: Gait belt;Back brace Nurse Communication: Mobility status;Patient requests pain meds  Activity Tolerance: Patient tolerated treatment well;Patient limited by pain Patient left: in chair;with call bell/phone within reach;with family/visitor present  OT Visit Diagnosis: Unsteadiness on feet (R26.81);Muscle weakness (generalized) (M62.81);Pain Pain - part of body:  (low back)  [id number] OT Time Calculation (min): 18 min Charges:  OT General Charges $OT Visit: 1 Visit  Gloris Manchester OTR/L Supplemental OT, Department of rehab services [phone]  Raneisha Bress R H. 02/28/2021, 8:43 AM

## 2021-03-01 ENCOUNTER — Inpatient Hospital Stay (HOSPITAL_COMMUNITY): Payer: Medicare Other

## 2021-03-01 DIAGNOSIS — N390 Urinary tract infection, site not specified: Secondary | ICD-10-CM

## 2021-03-01 DIAGNOSIS — E782 Mixed hyperlipidemia: Secondary | ICD-10-CM

## 2021-03-01 DIAGNOSIS — K219 Gastro-esophageal reflux disease without esophagitis: Secondary | ICD-10-CM

## 2021-03-01 DIAGNOSIS — E860 Dehydration: Secondary | ICD-10-CM

## 2021-03-01 DIAGNOSIS — K529 Noninfective gastroenteritis and colitis, unspecified: Secondary | ICD-10-CM

## 2021-03-01 DIAGNOSIS — K567 Ileus, unspecified: Secondary | ICD-10-CM

## 2021-03-01 DIAGNOSIS — M48061 Spinal stenosis, lumbar region without neurogenic claudication: Principal | ICD-10-CM

## 2021-03-01 LAB — URINALYSIS, MICROSCOPIC (REFLEX)

## 2021-03-01 LAB — CBC WITH DIFFERENTIAL/PLATELET
Abs Immature Granulocytes: 0.06 10*3/uL (ref 0.00–0.07)
Basophils Absolute: 0 10*3/uL (ref 0.0–0.1)
Basophils Relative: 0 %
Eosinophils Absolute: 0 10*3/uL (ref 0.0–0.5)
Eosinophils Relative: 0 %
HCT: 36.2 % (ref 36.0–46.0)
Hemoglobin: 12.5 g/dL (ref 12.0–15.0)
Immature Granulocytes: 1 %
Lymphocytes Relative: 5 %
Lymphs Abs: 0.6 10*3/uL — ABNORMAL LOW (ref 0.7–4.0)
MCH: 32.3 pg (ref 26.0–34.0)
MCHC: 34.5 g/dL (ref 30.0–36.0)
MCV: 93.5 fL (ref 80.0–100.0)
Monocytes Absolute: 1.4 10*3/uL — ABNORMAL HIGH (ref 0.1–1.0)
Monocytes Relative: 12 %
Neutro Abs: 9.7 10*3/uL — ABNORMAL HIGH (ref 1.7–7.7)
Neutrophils Relative %: 82 %
Platelets: 157 10*3/uL (ref 150–400)
RBC: 3.87 MIL/uL (ref 3.87–5.11)
RDW: 13 % (ref 11.5–15.5)
WBC: 11.7 10*3/uL — ABNORMAL HIGH (ref 4.0–10.5)
nRBC: 0 % (ref 0.0–0.2)

## 2021-03-01 LAB — URINALYSIS, ROUTINE W REFLEX MICROSCOPIC
Glucose, UA: NEGATIVE mg/dL
Ketones, ur: 15 mg/dL — AB
Nitrite: POSITIVE — AB
Protein, ur: 30 mg/dL — AB
Specific Gravity, Urine: >1.03 — ABNORMAL HIGH (ref 1.005–1.030)
pH: 6.5 (ref 5.0–8.0)

## 2021-03-01 LAB — BASIC METABOLIC PANEL
Anion gap: 9 (ref 5–15)
BUN: 16 mg/dL (ref 8–23)
CO2: 22 mmol/L (ref 22–32)
Calcium: 9.8 mg/dL (ref 8.9–10.3)
Chloride: 104 mmol/L (ref 98–111)
Creatinine, Ser: 1.06 mg/dL — ABNORMAL HIGH (ref 0.44–1.00)
GFR, Estimated: 55 mL/min — ABNORMAL LOW (ref >60)
Glucose, Bld: 137 mg/dL — ABNORMAL HIGH (ref 70–99)
Potassium: 4.1 mmol/L (ref 3.5–5.1)
Sodium: 135 mmol/L (ref 135–145)

## 2021-03-01 MED ORDER — POLYETHYLENE GLYCOL 3350 17 G PO PACK
17.0000 g | PACK | Freq: Every day | ORAL | Status: DC | PRN
  Filled 2021-03-01: qty 1

## 2021-03-01 MED ORDER — HYDROCODONE-ACETAMINOPHEN 5-325 MG PO TABS
1.0000 | ORAL_TABLET | ORAL | Status: DC | PRN
  Administered 2021-03-02: 2 via ORAL
  Administered 2021-03-02: 1 via ORAL
  Administered 2021-03-03 (×3): 2 via ORAL
  Administered 2021-03-03 – 2021-03-04 (×6): 1 via ORAL
  Administered 2021-03-04: 2 via ORAL
  Administered 2021-03-05 – 2021-03-07 (×2): 1 via ORAL
  Filled 2021-03-01 (×3): qty 1
  Filled 2021-03-01: qty 2
  Filled 2021-03-01: qty 1
  Filled 2021-03-01 (×4): qty 2
  Filled 2021-03-01: qty 1
  Filled 2021-03-01: qty 2
  Filled 2021-03-01 (×3): qty 1
  Filled 2021-03-01: qty 2

## 2021-03-01 MED ORDER — SODIUM CHLORIDE 0.9 % IV BOLUS
500.0000 mL | Freq: Once | INTRAVENOUS | Status: AC
  Administered 2021-03-01: 500 mL via INTRAVENOUS

## 2021-03-01 MED ORDER — CYCLOBENZAPRINE HCL 5 MG PO TABS
5.0000 mg | ORAL_TABLET | Freq: Three times a day (TID) | ORAL | Status: DC | PRN
Start: 2021-03-01 — End: 2021-03-01

## 2021-03-01 MED ORDER — SODIUM CHLORIDE 0.9 % IV SOLN: INTRAVENOUS | Status: DC

## 2021-03-01 MED ORDER — CIPROFLOXACIN HCL 500 MG PO TABS
500.0000 mg | ORAL_TABLET | Freq: Two times a day (BID) | ORAL | Status: DC
  Administered 2021-03-01 – 2021-03-08 (×11): 500 mg via ORAL
  Filled 2021-03-01 (×14): qty 1

## 2021-03-01 MED ORDER — DEXAMETHASONE SODIUM PHOSPHATE 4 MG/ML IJ SOLN
4.0000 mg | Freq: Four times a day (QID) | INTRAMUSCULAR | Status: AC
  Administered 2021-03-01 (×2): 4 mg via INTRAVENOUS
  Filled 2021-03-01 (×2): qty 1

## 2021-03-01 NOTE — Progress Notes (Signed)
OT Cancellation Note  Patient Details Name: Janice Pace MRN: [id number] DOB: 11/19/1945   Cancelled Treatment:    Reason Eval/Treat Not Completed: Fatigue/lethargy limiting ability to participate. Patient c/o itching last night. Given benadryl. Continued lethargy this a.m. OT to check back as time allows.   Gloris Manchester OTR/L Supplemental OT, Department of rehab services [phone]   Lawayne Hartig R H. 03/01/2021, 8:02 AM

## 2021-03-01 NOTE — Progress Notes (Signed)
Subjective: Patient reports  somnolent but arousable complaining of back pain denies any leg pain or numbness  Objective: Vital signs in last 24 hours: Temp:  [97.7 F (36.5 C)-98.4 F (36.9 C)] 97.7 F (36.5 C) (08/19 0738) Pulse Rate:  [99-128] 128 (08/19 0738) Resp:  [18] 18 (08/19 0738) BP: (100-163)/(60-78) 100/63 (08/19 0738) SpO2:  [92 %-100 %] 94 % (08/19 0738)  Intake/Output from previous day: 08/18 0701 - 08/19 0700 In: -  Out: 125 [Drains:125] Intake/Output this shift: No intake/output data recorded.  Patient is somnolent but arousable strength appears to be 5 out of 5 but her drowsiness is minimizing her effort.  Incisions clean dry and intact  Lab Results: No results for input(s): WBC, HGB, HCT, PLT in the last 72 hours. BMET No results for input(s): NA, K, CL, CO2, GLUCOSE, BUN, CREATININE, CALCIUM in the last 72 hours.  Studies/Results: DG Lumbar Spine 2-3 Views  Result Date: 02/27/2021 CLINICAL DATA:  Elective surgery Z41.9 (ICD-10-CM). Additional history provided by technologist: L1-3 pedicle screws (previous L2-4 XLIF). Provided fluoroscopy time 1 minutes, 7 seconds (54.54 mGy). EXAM: LUMBAR SPINE - 2-3 VIEW; DG C-ARM 1-60 MIN COMPARISON:  Lumbar spine radiograph 12/18/2020. FINDINGS: AP and lateral view intraoperative fluoroscopic images of the lumbar spine are submitted, 2 images total. Per the preoperative H&P, the pre-existing posterior spinal fusion construct spans the L4-S1 levels. Spinal numbering will remain consistent with this numbering scheme. On today's examination, there is a partially imaged posterior spinal fusion construct extending caudally from the L4 level. Bilateral pedicle screws are present at L1, L2 and L3. Vertical interconnecting rods were not present at the time the images were taken. Overlying retractors. Interbody devices are now present at L2-L3 and L3-L4. Redemonstrated interbody devices at L4-L5. Correlate with the operative history.  IMPRESSION: Two intraoperative fluoroscopic images of the lumbar spine, as described. Electronically Signed   By: Kellie Simmering D.O.   On: 02/27/2021 13:17   DG Lumbar Spine 2-3 Views  Result Date: 02/27/2021 CLINICAL DATA:  L2-4 XLIF.  Evaluate for foreign body. EXAM: LUMBAR SPINE - 2-3 VIEW COMPARISON:  Radiographs 12/18/2020.  CT 06/27/2020. FINDINGS: PA and lateral (2) spot fluoroscopic images of the mid lumbar spine are submitted. 5 lumbar type vertebral bodies are assumed with hypoplastic ribs at T12 and previous L4 through S1 PLIF. Surgical instruments are in place status post XLIF at the L2-3 and L3-4 levels. No unexpected foreign bodies or complications are identified. IMPRESSION: Intraoperative views during L2 through 4 XLIF. No demonstrated complication or unexpected foreign body. These results were called by telephone at the time of interpretation on 02/27/2021 at 11:20 am to representative of provider Kary Kos in the operating room, who verbally acknowledged these results. Electronically Signed   By: Richardean Sale M.D.   On: 02/27/2021 11:24   DG C-Arm 1-60 Min  Result Date: 02/27/2021 CLINICAL DATA:  Elective surgery Z41.9 (ICD-10-CM). Additional history provided by technologist: L1-3 pedicle screws (previous L2-4 XLIF). Provided fluoroscopy time 1 minutes, 7 seconds (54.54 mGy). EXAM: LUMBAR SPINE - 2-3 VIEW; DG C-ARM 1-60 MIN COMPARISON:  Lumbar spine radiograph 12/18/2020. FINDINGS: AP and lateral view intraoperative fluoroscopic images of the lumbar spine are submitted, 2 images total. Per the preoperative H&P, the pre-existing posterior spinal fusion construct spans the L4-S1 levels. Spinal numbering will remain consistent with this numbering scheme. On today's examination, there is a partially imaged posterior spinal fusion construct extending caudally from the L4 level. Bilateral pedicle screws are present at L1,  L2 and L3. Vertical interconnecting rods were not present at the time the  images were taken. Overlying retractors. Interbody devices are now present at L2-L3 and L3-L4. Redemonstrated interbody devices at L4-L5. Correlate with the operative history. IMPRESSION: Two intraoperative fluoroscopic images of the lumbar spine, as described. Electronically Signed   By: Kellie Simmering D.O.   On: 02/27/2021 13:17    Assessment/Plan: Postop day 2 anterior posterior lumbar stabilization doing okay very somnolent I feel this might be medication effect we will discontinue her cyclobenzaprine, her methocarbamol, her oxycodone, and her Dilaudid.  We will leave hydrocodone on board as needed.  We will check a BMP we will discontinue her drain give her couple doses of steroids to see if that perks her up continue to work with physical therapy today  LOS: 2 days     Elaina Hoops 03/01/2021, 8:23 AM

## 2021-03-01 NOTE — Progress Notes (Signed)
Physical Therapy Treatment Patient Details Name: Janice Pace MRN: [id number] DOB: [birth year] Today's Date: 03/01/2021    History of Present Illness Pt is a 75 y/o female presenting with radicular lumbar symptoms s/p elective L2-L4 ALIF on 02/27/2021. PMHx significant for several back surgeries including previous L4-S1 fusion in 2013, CA, heart murmur and valve disorder, pre-diabetes.    PT Comments    PT held in AM per RN request due to lethargy. Checked back at 1300 and pt still very sleepy. She aroused easier than expected to voice but unable to maintain eyes open. Attempted transition to EOB to stimulate pt however she required total assist to roll and deferred sitting EOB without second person present for safety. Pt had brief periods of wakefulness, asking for water, and requiring max assist to hold and bring cup to her mouth. Noted RUE shaking consistently throughout session. Husband states this is new since he saw her this AM. Will continue to follow and progress as able per POC.    Follow Up Recommendations  Home health PT;Supervision for mobility/OOB     Equipment Recommendations  Rolling walker with 5" wheels;3in1 (PT)    Recommendations for Other Services       Precautions / Restrictions Precautions Precautions: Back;Fall Precaution Booklet Issued: Yes (comment) Precaution Comments: Reviewed handout and pt was cued for precautions during functional mobility. Required Braces or Orthoses: Spinal Brace Spinal Brace: Lumbar corset;Applied in sitting position Restrictions Weight Bearing Restrictions: No    Mobility  Bed Mobility Overal bed mobility: Needs Assistance Bed Mobility: Rolling Rolling: Total assist         General bed mobility comments: Hand-over-hand assist to reach for railing and initiate roll to the L. Pt gripping therapist's hand however unable to grip and hold railing - hand falling off with several attempts. Total assist to roll to sidelying but did  not attempt to transition to sitting position due to lethargy.    Transfers                 General transfer comment: Did not progress mobility at this time.  Ambulation/Gait                 Stairs             Wheelchair Mobility    Modified Rankin (Stroke Patients Only)       Balance       Sitting balance - Comments: Not tested this session                                    Cognition Arousal/Alertness: Lethargic;Suspect due to medications Behavior During Therapy: [facility] for tasks assessed/performed Overall Cognitive Status: Impaired/Different from baseline                                        Exercises      General Comments        Pertinent Vitals/Pain Pain Assessment: Faces Faces Pain Scale: No hurt    Home Living                      Prior Function            PT Goals (current goals can now be found in the care plan section) Acute Rehab PT Goals Patient Stated Goal: To  decrease pain PT Goal Formulation: With patient/family Time For Goal Achievement: 03/07/21 Potential to Achieve Goals: Good Progress towards PT goals: Progressing toward goals    Frequency    Min 5X/week      PT Plan Current plan remains appropriate    Co-evaluation              AM-PAC PT "6 Clicks" Mobility   Outcome Measure  Help needed turning from your back to your side while in a flat bed without using bedrails?: Total Help needed moving from lying on your back to sitting on the side of a flat bed without using bedrails?: Total Help needed moving to and from a bed to a chair (including a wheelchair)?: Total Help needed standing up from a chair using your arms (e.g., wheelchair or bedside chair)?: Total Help needed to walk in hospital room?: Total Help needed climbing 3-5 steps with a railing? : Total 6 Click Score: 6    End of Session   Activity Tolerance: Patient limited by lethargy Patient  left: in bed;with call bell/phone within reach;with family/visitor present Nurse Communication: Mobility status PT Visit Diagnosis: Unsteadiness on feet (R26.81);Pain Pain - part of body:  (back)     Time: [id number] PT Time Calculation (min) (ACUTE ONLY): 18 min  Charges:  $Therapeutic Activity: 8-22 mins                     Rolinda Roan, PT, DPT Acute Rehabilitation Services Pager: [phone] Office: [phone]    Thelma Comp 03/01/2021, 1:42 PM

## 2021-03-01 NOTE — Consult Note (Signed)
Medical Consultation   Janice Pace  [id number]  DOB: [birth year]  DOA: 02/27/2021  PCP: Leeroy Cha, MD   Requesting physician: Parks Neptune PA (Neurosurgery)  Reason for consultation:Tachycardia, Ileus, UTI  History of Present Illness: Janice Pace is a 75 y.o. female with past medical history of GERD, hyperlipidemia, DDD and foraminal stenosis  L2-3 L3-4 and L1-L2 instability L3-4 with segmental degeneration above her previous fusion from L4-S1 s/p Anterior lateral interbody fusions L2-3 L3-4 (8/17).  Hospitalist was consulted due to presumed UTI, tachycardia and ileus.  Patient states that her last bowel movement was prior to surgical procedure on 8/17, this was thought to be due to patient's opioid use.  She was also noted to be somnolent today (RN states that patient was given Benadryl last night and that patient's husband reported that patient could sleep for up to 12 hours if she takes Benadryl).  She was also noted to have increased abdominal distention, so CT abdomen pelvis with contrast was done and showed diarrheal state with possible mild colitis without any bowel obstruction.  She was started on ciprofloxacin, hospitalist was then consulted for further management. At bedside, patient was not in any acute distress, she endorsed increased abdominal distention without any abdominal pain, fever, nausea or vomiting.   Review of Systems:  Pt complains of abdominal distention  Pt denies any fever, nausea, vomiting, diarrhea.  Review of systems are otherwise negative.    Past Medical History: Past Medical History:  Diagnosis Date   Arthritis    Bronchitis    Cancer ([place])    dermatosivros arcoma and protuverans   Chronic back pain    degenerative disc disease   Constipation    stool softener daily   Constipation    GERD (gastroesophageal reflux disease)    takes Prilosec daily   GERD (gastroesophageal reflux disease)    Heart murmur    Heart valve  disorder    Hemorrhoids    History of hiatal hernia    Insomnia    d/t chantix   Joint pain    Nocturia    Personal history of colonic adenoma 03/13/2003   03/13/2003 - 5 mm adenoma   Pre-diabetes    Prediabetes     Past Surgical History: Past Surgical History:  Procedure Laterality Date   ABDOMINAL HYSTERECTOMY     BACK SURGERY  1988   BACK SURGERY  09-03-11   rods,screws x 8    BUNIONECTOMY     bilateral   BUNIONECTOMY     bil feet   COLONOSCOPY     dermatosibrosarcoma protuberans     PARTIAL HYSTERECTOMY     RECTOCELE REPAIR     Rectum Repair     RESECTION TUMOR WRIST RADICAL     x 2 rt   TUBAL LIGATION  1974   TUBAL LIGATION     WRIST SURGERY     left     Allergies:   Allergies  Allergen Reactions   Sulfa Antibiotics Hives and Itching   Atorvastatin Other (See Comments)    mucsle aches   Metformin Hcl Other (See Comments)    joint pains and muscle aches     Social History:  reports that she quit smoking about 9 years ago. Her smoking use included cigarettes. She has a 50.00 pack-year smoking history. She has never used smokeless tobacco. She reports that she does not drink alcohol and does  not use drugs.   Family History: Family History  Problem Relation Age of Onset   Cancer Mother    Anesthesia problems Neg Hx    Hypotension Neg Hx    Malignant hyperthermia Neg Hx    Pseudochol deficiency Neg Hx    Colon cancer Neg Hx     Physical Exam: Vitals:   03/01/21 2253 03/02/21 0042 03/02/21 0044 03/02/21 0310  BP: 128/76 125/86 125/86 118/67  Pulse: (!) 120 (!) 128 (!) 128 (!) 120  Resp: 20 (!) 22 20 (!) 25  Temp: 98.6 F (37 C) 98.2 F (36.8 C) 98.3 F (36.8 C) 98.1 F (36.7 C)  TempSrc: Oral Oral Oral Oral  SpO2: 92% 94% 94% 98%  Weight:      Height:        Physical Exam  Patient is examined daily including today onTODAY@  , exams remain the same as of yesterday except that has changed   Gen:- Awake Alert and oriented x3, no with  intermittent confusion HEENT:-Dry mucous membrane.  [place].AT, No sclera icterus Neck-Supple Neck,No JVD,.  Lungs-  CTAB, no wheezing CV- S1, S2 normal Abd-distended abdomen, decreased bowel sounds.  o tenderness,    Extremity/Skin:- No  edema, no rashes Psych-affect is appropriate Neuro-no new focal deficits, no tremors    Data reviewed:  I have personally reviewed following labs and imaging studies Labs:  CBC: Recent Labs  Lab 03/01/21 0903  WBC 11.7*  NEUTROABS 9.7*  HGB 12.5  HCT 36.2  MCV 93.5  PLT [id number]    Basic Metabolic Panel: Recent Labs  Lab 03/01/21 0903  NA 135  K 4.1  CL 104  CO2 22  GLUCOSE 137*  BUN 16  CREATININE 1.06*  CALCIUM 9.8   GFR Estimated Creatinine Clearance: 47.9 mL/min (A) (by C-G formula based on SCr of 1.06 mg/dL (H)). Liver Function Tests: No results for input(s): AST, ALT, ALKPHOS, BILITOT, PROT, ALBUMIN in the last 168 hours. No results for input(s): LIPASE, AMYLASE in the last 168 hours. No results for input(s): AMMONIA in the last 168 hours. Coagulation profile No results for input(s): INR, PROTIME in the last 168 hours.  Cardiac Enzymes: No results for input(s): CKTOTAL, CKMB, CKMBINDEX, TROPONINI in the last 168 hours. BNP: Invalid input(s): POCBNP CBG: Recent Labs  Lab 02/27/21 0705  GLUCAP 146*   D-Dimer No results for input(s): DDIMER in the last 72 hours. Hgb A1c No results for input(s): HGBA1C in the last 72 hours. Lipid Profile No results for input(s): CHOL, HDL, LDLCALC, TRIG, CHOLHDL, LDLDIRECT in the last 72 hours. Thyroid function studies No results for input(s): TSH, T4TOTAL, T3FREE, THYROIDAB in the last 72 hours.  Invalid input(s): FREET3 Anemia work up No results for input(s): VITAMINB12, FOLATE, FERRITIN, TIBC, IRON, RETICCTPCT in the last 72 hours. Urinalysis    Component Value Date/Time   COLORURINE BROWN (A) 03/01/2021 1713   APPEARANCEUR CLEAR 03/01/2021 1713   LABSPEC >1.030 (H) 03/01/2021  1713   PHURINE 6.5 03/01/2021 1713   GLUCOSEU NEGATIVE 03/01/2021 1713   HGBUR TRACE (A) 03/01/2021 1713   BILIRUBINUR LARGE (A) 03/01/2021 1713   KETONESUR 15 (A) 03/01/2021 1713   PROTEINUR 30 (A) 03/01/2021 1713   NITRITE POSITIVE (A) 03/01/2021 1713   LEUKOCYTESUR TRACE (A) 03/01/2021 1713     Microbiology Recent Results (from the past 240 hour(s))  SARS Coronavirus 2 (TAT 6-24 hrs)     Status: None   Collection Time: 02/25/21 12:00 AM  Result Value Ref Range  Status   SARS Coronavirus 2 RESULT: NEGATIVE  Final    Comment: RESULT: NEGATIVESARS-CoV-2 INTERPRETATION:A NEGATIVE  test result means that SARS-CoV-2 RNA was not present in the specimen above the limit of detection of this test. This does not preclude a possible SARS-CoV-2 infection and should not be used as the  sole basis for patient management decisions. Negative results must be combined with clinical observations, patient history, and epidemiological information. Optimum specimen types and timing for peak viral levels during infections caused by SARS-CoV-2  have not been determined. Collection of multiple specimens or types of specimens may be necessary to detect virus. Improper specimen collection and handling, sequence variability under primers/probes, or organism present below the limit of detection may  lead to false negative results. Positive and negative predictive values of testing are highly dependent on prevalence. False negative test results are more likely when prevalence of disease is high.The expected result is NEGATIVE.Fact S heet for  Healthcare Providers: LocalChronicle.no Sheet for Patients: SalonLookup.es Reference Range - Negative        Inpatient Medications:   Scheduled Meds:  ascorbic acid  1,000 mg Oral Daily   aspirin EC  81 mg Oral Daily   calcium-vitamin D  2 tablet Oral Once per day on Mon Wed Fri   Chlorhexidine Gluconate Cloth  6  each Topical Daily   ciprofloxacin  500 mg Oral BID   docusate sodium  500 mg Oral QHS   loratadine  10 mg Oral Daily   multivitamin  1 tablet Oral Daily   pantoprazole  40 mg Oral Daily   rosuvastatin  10 mg Oral Daily   sodium chloride flush  3 mL Intravenous Q12H   Continuous Infusions:  sodium chloride 250 mL (03/01/21 1146)   sodium chloride 75 mL/hr at 03/01/21 2136     Radiological Exams on Admission: DG Abd 1 View  Result Date: 03/01/2021 CLINICAL DATA:  Abdominal distension, recent surgery EXAM: ABDOMEN - 1 VIEW COMPARISON:  None. FINDINGS: There is diffuse gaseous distension and dilation of small bowel/colon and likely of the stomach. Lumbosacral fusion hardware appears intact. There is a drain/tubing coursing along the right hemiabdomen, possibly in the back. IMPRESSION: Diffuse gaseous dilation of bowel compatible with adynamic ileus. Electronically Signed   By: Maurine Simmering M.D.   On: 03/01/2021 18:32   CT ABDOMEN PELVIS W CONTRAST  Result Date: 03/02/2021 CLINICAL DATA:  Concern for bowel obstruction. EXAM: CT ABDOMEN AND PELVIS WITH CONTRAST TECHNIQUE: Multidetector CT imaging of the abdomen and pelvis was performed using the standard protocol following bolus administration of intravenous contrast. CONTRAST:  151m OMNIPAQUE IOHEXOL 350 MG/ML SOLN COMPARISON:  Abdominal radiograph dated 03/01/2021 FINDINGS: Lower chest: Bibasilar linear atelectasis/scarring. The visualized lung bases are otherwise clear. No intra-abdominal free air.  Small free fluid in the pelvis. Hepatobiliary: Fatty liver. No intrahepatic biliary dilatation. The gallbladder is unremarkable. Pancreas: Unremarkable. No pancreatic ductal dilatation or surrounding inflammatory changes. Spleen: Normal in size without focal abnormality. Adrenals/Urinary Tract: Indeterminate bilateral adrenal nodules measuring 15 mm on the left. There is no hydronephrosis on either side. There is symmetric enhancement and excretion of  contrast by both kidneys. The visualized ureters appear unremarkable. The urinary bladder is decompressed around a Foley catheter. Stomach/Bowel: Small hiatal hernia. Loose stool noted throughout the colon compatible with diarrheal state. Correlation with clinical exam and stool cultures recommended. No evidence of bowel obstruction. Mild hazy appearance of the colonic wall may be reactive or represent mild colitis. The appendix is  not visualized with certainty. No inflammatory changes identified in the right lower quadrant. Vascular/Lymphatic: Advanced aortoiliac atherosclerotic disease. The IVC is unremarkable. No portal venous gas. There is no adenopathy. Reproductive: Hysterectomy.  No adnexal masses. Other: Small fat containing umbilical hernia. Musculoskeletal: Multilevel degenerative changes of the spine and lumbar fusion. No acute osseous pathology. IMPRESSION: 1. Diarrheal state with possible mild colitis. Correlation with clinical exam and stool cultures recommended. No bowel obstruction. 2. Fatty liver. 3. Indeterminate bilateral adrenal nodules measuring 15 mm on the left. 4. Aortic Atherosclerosis (ICD10-I70.0). Electronically Signed   By: Anner Crete M.D.   On: 03/02/2021 00:57    Impression/Recommendations Active Problems:   Hyperlipidemia   Spinal stenosis of lumbar region   Acute lower UTI   Colitis   Ileus (HCC)   Dehydration   GERD (gastroesophageal reflux disease)  75 year old female with PMH of DDD and foraminal stenosis s/p lumbar surgery who presents with abdominal distention and abdominal ileus.  Abdominal ileus with possible mild colitis CT of abdomen consistent with ileus Patient is without nausea or vomiting; we shall hold off on NG tube at this time; consider NG tube placement with worsening abdominal distention/symptoms including vomiting Patient was empirically started on ciprofloxacin  Presumed UTI Urinalysis was positive for nitrite, she was presumed to have  UTI, though patient denies any irritative bladder symptoms at this time Continue ciprofloxacin per neurosurgery team  Tachycardia This may be multifactorial ECG will be done Continue telemetry  Dehydration Continue IV hydration   GERD Continue Protonix  Hyperlipidemia Continue Crestor  Spinal stenosis of lumbar region postop (operated on 8/17) Continue management per neurosurgery team  Thank you for this consultation.  Our [facility] hospitalist team will follow the patient with you.     Bernadette Hoit M.D. [facility]  03/02/2021, 4:09 AM

## 2021-03-01 NOTE — Progress Notes (Signed)
Patient ID: [name], female   DOB: 25-May-1946, 75 y.o.   MRN: [id number] Patient a little more awake on rounds this afternoon but still somnolent.  Foley replaced by floor nurse found bloody urine but does appear to be clearing.  Patient does have distended abdomen she says she has been passing gas  Awake alert moves both lower extremities well abdomen is distended bilateral lower quadrant tenderness hypoactive bowel sounds incisions clean dry and intact  Laboratory data relatively unremarkable with a sodium of 135 BUN and creatinine of 16 and 1.06 respectively White count 11.7 may be a function of steroids at the time of surgery hemoglobin hematocrit 12.5/36.2  Postop day 2 anterior posterior spinal fusion surgery more than likely patient has postoperative ileus she is passing gas but abdomen is distended with hypoactive bowel sounds.  Patient is currently on IV fluids electrolytes appear to be relatively unremarkable have ordered a KUB and discussed with general surgery who will help me follow-up with KUB and decide any additional work-up.

## 2021-03-01 NOTE — Consult Note (Addendum)
Reason for Consult:abdominal distension Referring Provider: [name] is an 75 y.o. female.  HPI: 75 yo female 2 days out from retroperitoneal approach for  anterior posterior lumbar stabilization. She was doing well yesterday but today somnolent. Initially, this was thought to be medication related but persisted throughout the day and now with worsened abdominal distension. Currently, she is awake and alert. She denies nausea or vomiting. She had flatus earlier today.  Past Medical History:  Diagnosis Date   Arthritis    Bronchitis    Cancer ([place])    dermatosivros arcoma and protuverans   Chronic back pain    degenerative disc disease   Constipation    stool softener daily   Constipation    GERD (gastroesophageal reflux disease)    takes Prilosec daily   GERD (gastroesophageal reflux disease)    Heart murmur    Heart valve disorder    Hemorrhoids    History of hiatal hernia    Insomnia    d/t chantix   Joint pain    Nocturia    Personal history of colonic adenoma 03/13/2003   03/13/2003 - 5 mm adenoma   Pre-diabetes    Prediabetes     Past Surgical History:  Procedure Laterality Date   ABDOMINAL HYSTERECTOMY     BACK SURGERY  1988   BACK SURGERY  09-03-11   rods,screws x 8    BUNIONECTOMY     bilateral   BUNIONECTOMY     bil feet   COLONOSCOPY     dermatosibrosarcoma protuberans     PARTIAL HYSTERECTOMY     RECTOCELE REPAIR     Rectum Repair     RESECTION TUMOR WRIST RADICAL     x 2 rt   TUBAL LIGATION  1974   TUBAL LIGATION     WRIST SURGERY     left    Family History  Problem Relation Age of Onset   Cancer Mother    Anesthesia problems Neg Hx    Hypotension Neg Hx    Malignant hyperthermia Neg Hx    Pseudochol deficiency Neg Hx    Colon cancer Neg Hx     Social History:  reports that she quit smoking about 9 years ago. Her smoking use included cigarettes. She has a 50.00 pack-year smoking history. She has never used smokeless  tobacco. She reports that she does not drink alcohol and does not use drugs.  Allergies:  Allergies  Allergen Reactions   Sulfa Antibiotics Hives and Itching   Atorvastatin Other (See Comments)    mucsle aches   Metformin Hcl Other (See Comments)    joint pains and muscle aches    Medications: I have reviewed the patient's current medications.  Results for orders placed or performed during the hospital encounter of 02/27/21 (from the past 48 hour(s))  Basic metabolic panel     Status: Abnormal   Collection Time: 03/01/21  9:03 AM  Result Value Ref Range   Sodium 135 135 - 145 mmol/L   Potassium 4.1 3.5 - 5.1 mmol/L   Chloride 104 98 - 111 mmol/L   CO2 22 22 - 32 mmol/L   Glucose, Bld 137 (H) 70 - 99 mg/dL    Comment: Glucose reference range applies only to samples taken after fasting for at least 8 hours.   BUN 16 8 - 23 mg/dL   Creatinine, Ser 1.06 (H) 0.44 - 1.00 mg/dL   Calcium 9.8 8.9 - 10.3 mg/dL  GFR, Estimated 55 (L) >60 mL/min    Comment: (NOTE) Calculated using the CKD-EPI Creatinine Equation (2021)    Anion gap 9 5 - 15    Comment: Performed at [facility], [place] [address], [place], [place] [address]  CBC with Differential/Platelet     Status: Abnormal   Collection Time: 03/01/21  9:03 AM  Result Value Ref Range   WBC 11.7 (H) 4.0 - 10.5 K/uL   RBC 3.87 3.87 - 5.11 MIL/uL   Hemoglobin 12.5 12.0 - 15.0 g/dL   HCT 36.2 36.0 - 46.0 %   MCV 93.5 80.0 - 100.0 fL   MCH 32.3 26.0 - 34.0 pg   MCHC 34.5 30.0 - 36.0 g/dL   RDW 13.0 11.5 - 15.5 %   Platelets 157 150 - 400 K/uL   nRBC 0.0 0.0 - 0.2 %   Neutrophils Relative % 82 %   Neutro Abs 9.7 (H) 1.7 - 7.7 K/uL   Lymphocytes Relative 5 %   Lymphs Abs 0.6 (L) 0.7 - 4.0 K/uL   Monocytes Relative 12 %   Monocytes Absolute 1.4 (H) 0.1 - 1.0 K/uL   Eosinophils Relative 0 %   Eosinophils Absolute 0.0 0.0 - 0.5 K/uL   Basophils Relative 0 %   Basophils Absolute 0.0 0.0 - 0.1 K/uL   Immature Granulocytes 1 %    Abs Immature Granulocytes 0.06 0.00 - 0.07 K/uL    Comment: Performed at [facility], [place] [address] [place], [place] [address]  Urinalysis, Routine w reflex microscopic Urine, Catheterized     Status: Abnormal   Collection Time: 03/01/21  5:13 PM  Result Value Ref Range   Color, Urine BROWN (A) YELLOW    Comment: BIOCHEMICALS MAY BE AFFECTED BY COLOR   APPearance CLEAR CLEAR   Specific Gravity, Urine >1.030 (H) 1.005 - 1.030   pH 6.5 5.0 - 8.0   Glucose, UA NEGATIVE NEGATIVE mg/dL   Hgb urine dipstick TRACE (A) NEGATIVE   Bilirubin Urine LARGE (A) NEGATIVE   Ketones, ur 15 (A) NEGATIVE mg/dL   Protein, ur 30 (A) NEGATIVE mg/dL   Nitrite POSITIVE (A) NEGATIVE   Leukocytes,Ua TRACE (A) NEGATIVE    Comment: Performed at [facility], [place] [address] [place], [place] [address]  Urinalysis, Microscopic (reflex)     Status: Abnormal   Collection Time: 03/01/21  5:13 PM  Result Value Ref Range   RBC / HPF 0-5 0 - 5 RBC/hpf   WBC, UA 0-5 0 - 5 WBC/hpf   Bacteria, UA RARE (A) NONE SEEN   Squamous Epithelial / LPF 0-5 0 - 5   Non Squamous Epithelial PRESENT (A) NONE SEEN   Mucus PRESENT    Hyaline Casts, UA PRESENT     Comment: Performed at [facility], [place] [address], [place], [place] [address]    DG Abd 1 View  Result Date: 03/01/2021 CLINICAL DATA:  Abdominal distension, recent surgery EXAM: ABDOMEN - 1 VIEW COMPARISON:  None. FINDINGS: There is diffuse gaseous distension and dilation of small bowel/colon and likely of the stomach. Lumbosacral fusion hardware appears intact. There is a drain/tubing coursing along the right hemiabdomen, possibly in the back. IMPRESSION: Diffuse gaseous dilation of bowel compatible with adynamic ileus. Electronically Signed   By: Maurine Simmering M.D.   On: 03/01/2021 18:32    Review of Systems  Gastrointestinal:  Positive for abdominal pain. Negative for nausea and vomiting.  All other systems reviewed and  are negative.  PE Blood  pressure 128/76, pulse (!) 120, temperature 98.7 F (37.1 C), temperature source Oral, resp. rate 18, height '5\' 2"'$  (1.575 m), weight 87.5 kg, SpO2 100 %. Constitutional: NAD; conversant; no deformities Eyes: Moist conjunctiva; no lid lag; anicteric; PERRL Neck: Trachea midline; no thyromegaly Lungs: Normal respiratory effort; no tactile fremitus CV: RRR; no palpable thrills; no pitting edema GI: Abd distended, diffused tenderness; no palpable hepatosplenomegaly MSK: unable to assess gait; no clubbing/cyanosis Psychiatric: Appropriate affect; alert and oriented x3 Lymphatic: No palpable cervical or axillary lymphadenopathy Skin: No major subcutaneous nodules. Warm and dry   Assessment/Plan: 75 yo female with lumbar surgery with retroperitoneal approach with abdominal distension. Xr confirming adynamic ileus with dilation of colon and small intestine. Currently alert and not nauseated -I think we can hold on NG tube placement at this time, but recommend placement if become nauseated -Cr 1.06 from 0.76, receiving bolus -tachycardic worse today -UA most consistent with traumatic placement  Arta Bruce Henery Betzold 03/01/2021, 9:14 PM  After further thoughts, persistent tachycardia and new distension warrants further investigation -will order Ct ab/pelv

## 2021-03-01 NOTE — Progress Notes (Signed)
Notified Dr Saintclair Halsted earlier about result of UA and patient current status.New order received for cipro 500 BID for UTI. Noted tea colored urine from urinary drainage bag. No c/o nausea so far. Abdomen remains distended and tender. Noted intermittent confusion (was alert and oriented x4 baseline) General surgery and hospitalist consulted and seen patient. Will transfer patient to 4NP. Will notify family of transfer.  03/01/21 2253  Vitals  Temp 98.6 F (37 C)  Temp Source Oral  BP 128/76  MAP (mmHg) 92  BP Location Left Arm  BP Method Automatic  Patient Position (if appropriate) Lying  Pulse Rate (!) 120  Resp 20  MEWS COLOR  MEWS Score Color Yellow  Oxygen Therapy  SpO2 92 %  O2 Device Room Air

## 2021-03-02 ENCOUNTER — Other Ambulatory Visit: Payer: Self-pay

## 2021-03-02 ENCOUNTER — Inpatient Hospital Stay (HOSPITAL_COMMUNITY): Payer: Medicare Other

## 2021-03-02 ENCOUNTER — Other Ambulatory Visit (HOSPITAL_COMMUNITY): Payer: Medicare Other

## 2021-03-02 DIAGNOSIS — K219 Gastro-esophageal reflux disease without esophagitis: Secondary | ICD-10-CM

## 2021-03-02 DIAGNOSIS — K567 Ileus, unspecified: Secondary | ICD-10-CM

## 2021-03-02 DIAGNOSIS — K529 Noninfective gastroenteritis and colitis, unspecified: Secondary | ICD-10-CM

## 2021-03-02 DIAGNOSIS — R000 Tachycardia, unspecified: Secondary | ICD-10-CM

## 2021-03-02 DIAGNOSIS — E860 Dehydration: Secondary | ICD-10-CM

## 2021-03-02 DIAGNOSIS — N390 Urinary tract infection, site not specified: Secondary | ICD-10-CM

## 2021-03-02 HISTORY — DX: Ileus, unspecified: K56.7

## 2021-03-02 LAB — COMPREHENSIVE METABOLIC PANEL
ALT: 22 U/L (ref 0–44)
AST: 33 U/L (ref 15–41)
Albumin: 2.5 g/dL — ABNORMAL LOW (ref 3.5–5.0)
Alkaline Phosphatase: 47 U/L (ref 38–126)
Anion gap: 8 (ref 5–15)
BUN: 21 mg/dL (ref 8–23)
CO2: 20 mmol/L — ABNORMAL LOW (ref 22–32)
Calcium: 8.2 mg/dL — ABNORMAL LOW (ref 8.9–10.3)
Chloride: 110 mmol/L (ref 98–111)
Creatinine, Ser: 0.96 mg/dL (ref 0.44–1.00)
GFR, Estimated: >60 mL/min (ref >60)
Glucose, Bld: 176 mg/dL — ABNORMAL HIGH (ref 70–99)
Potassium: 3.9 mmol/L (ref 3.5–5.1)
Sodium: 138 mmol/L (ref 135–145)
Total Bilirubin: 1.3 mg/dL — ABNORMAL HIGH (ref 0.3–1.2)
Total Protein: 5.1 g/dL — ABNORMAL LOW (ref 6.5–8.1)

## 2021-03-02 LAB — CREATININE, URINE, RANDOM: Creatinine, Urine: 129.07 mg/dL

## 2021-03-02 LAB — SODIUM, URINE, RANDOM: Sodium, Ur: <10 mmol/L

## 2021-03-02 LAB — OSMOLALITY, URINE: Osmolality, Ur: 824 mOsm/kg (ref 300–900)

## 2021-03-02 MED ORDER — CHLORHEXIDINE GLUCONATE CLOTH 2 % EX PADS
6.0000 | MEDICATED_PAD | Freq: Every day | CUTANEOUS | Status: DC
Start: 2021-03-02 — End: 2021-03-05
  Administered 2021-03-02 – 2021-03-03 (×3): 6 via TOPICAL

## 2021-03-02 MED ORDER — IOHEXOL 350 MG/ML SOLN
100.0000 mL | Freq: Once | INTRAVENOUS | Status: AC | PRN
  Administered 2021-03-02: 100 mL via INTRAVENOUS

## 2021-03-02 NOTE — Progress Notes (Signed)
ileus  Subjective: Pt with multiple BM's overnight.  Denies any nausea currently  Objective: Vital signs in last 24 hours: Temp:  [97.6 F (36.4 C)-99.8 F (37.7 C)] 99.8 F (37.7 C) (08/20 0732) Pulse Rate:  [120-128] 122 (08/20 0732) Resp:  [17-25] 25 (08/20 0732) BP: (114-128)/(62-86) 114/62 (08/20 0732) SpO2:  [90 %-100 %] 93 % (08/20 0732)    Intake/Output from previous day: 08/19 0701 - 08/20 0700 In: 675 [I.V.:675] Out: 1460 [Urine:1400; Drains:60] Intake/Output this shift: No intake/output data recorded.  General appearance: alert and cooperative GI: distended, mild TTP  Lab Results:  Results for orders placed or performed during the hospital encounter of 02/27/21 (from the past 24 hour(s))  Urinalysis, Routine w reflex microscopic Urine, Catheterized     Status: Abnormal   Collection Time: 03/01/21  5:13 PM  Result Value Ref Range   Color, Urine BROWN (A) YELLOW   APPearance CLEAR CLEAR   Specific Gravity, Urine >1.030 (H) 1.005 - 1.030   pH 6.5 5.0 - 8.0   Glucose, UA NEGATIVE NEGATIVE mg/dL   Hgb urine dipstick TRACE (A) NEGATIVE   Bilirubin Urine LARGE (A) NEGATIVE   Ketones, ur 15 (A) NEGATIVE mg/dL   Protein, ur 30 (A) NEGATIVE mg/dL   Nitrite POSITIVE (A) NEGATIVE   Leukocytes,Ua TRACE (A) NEGATIVE  Urinalysis, Microscopic (reflex)     Status: Abnormal   Collection Time: 03/01/21  5:13 PM  Result Value Ref Range   RBC / HPF 0-5 0 - 5 RBC/hpf   WBC, UA 0-5 0 - 5 WBC/hpf   Bacteria, UA RARE (A) NONE SEEN   Squamous Epithelial / LPF 0-5 0 - 5   Non Squamous Epithelial PRESENT (A) NONE SEEN   Mucus PRESENT    Hyaline Casts, UA PRESENT   Comprehensive metabolic panel     Status: Abnormal   Collection Time: 03/02/21  8:21 AM  Result Value Ref Range   Sodium 138 135 - 145 mmol/L   Potassium 3.9 3.5 - 5.1 mmol/L   Chloride 110 98 - 111 mmol/L   CO2 20 (L) 22 - 32 mmol/L   Glucose, Bld 176 (H) 70 - 99 mg/dL   BUN 21 8 - 23 mg/dL   Creatinine, Ser  0.96 0.44 - 1.00 mg/dL   Calcium 8.2 (L) 8.9 - 10.3 mg/dL   Total Protein 5.1 (L) 6.5 - 8.1 g/dL   Albumin 2.5 (L) 3.5 - 5.0 g/dL   AST 33 15 - 41 U/L   ALT 22 0 - 44 U/L   Alkaline Phosphatase 47 38 - 126 U/L   Total Bilirubin 1.3 (H) 0.3 - 1.2 mg/dL   GFR, Estimated >60 >60 mL/min   Anion gap 8 5 - 15     Studies/Results Radiology     MEDS, Scheduled  ascorbic acid  1,000 mg Oral Daily   aspirin EC  81 mg Oral Daily   calcium-vitamin D  2 tablet Oral Once per day on Mon Wed Fri   Chlorhexidine Gluconate Cloth  6 each Topical Daily   ciprofloxacin  500 mg Oral BID   docusate sodium  500 mg Oral QHS   loratadine  10 mg Oral Daily   multivitamin  1 tablet Oral Daily   pantoprazole  40 mg Oral Daily   rosuvastatin  10 mg Oral Daily   sodium chloride flush  3 mL Intravenous Q12H     Assessment: Post op ileus: seems to be resolving  Plan: Advance diet as tolerated.  Will recheck tomorrow to make sure she continues to improve.    LOS: 3 days    Rosario Adie, MD [facility], [place]    03/02/2021 9:44 AM

## 2021-03-02 NOTE — Progress Notes (Signed)
Occupational Therapy Treatment Patient Details Name: Janice Pace MRN: [id number] DOB: 1946-05-26 Today's Date: 03/02/2021    History of present illness Pt is a 75 y/o female presenting with radicular lumbar symptoms s/p elective L2-L4 ALIF on 02/27/2021. Post-surgical complication of CT scan of abdomen pelvis was consistent with ileus. PMHx significant for several back surgeries including previous L4-S1 fusion in 2013, CA, heart murmur and valve disorder, pre-diabetes.   OT comments  Pt received semi-reclined in bed, daughter/husband present, pt agreeable to OT tx. OT/PT co-tx 2/2 safety, line management, and quality of session as pt had a significant decline in function status yesterday per nurse and PT. However, pt more awake/alert and able to give a better effort today. Time/charges adjusted below to reflect OT/PT co-tx. OT focused on bed mobility in prep for ADLs, OOB ADLs standing at sink/sitting in chair, and ADL mobility using RW. Pt required min assist x1 for functional mobility/transfers, min assist for grooming at sink, setup-mod I for self-feeding in chair, and min assist for functional mobility from bed>sink>chair using RW. +1 extra assist for line management. HR increased to 129bpm during functional mobility- nurse aware. OT re-educated pt/daughter on spinal precautions, PLB, activity pacing, log rolling, brace management/purpose, and adaptive ADL strategies. Current post-acute OT recs remain appropriate. Pt would benefit from continued skilled acute care OT services to maximize independence with ADLs/ADL mobility.    Follow Up Recommendations  Home health OT;Supervision/Assistance - 24 hour    Equipment Recommendations  3 in 1 bedside commode;Other (comment) (rolling walker)    Recommendations for Other Services Other (comment) (None)    Precautions / Restrictions Precautions Precautions: Back;Fall Precaution Booklet Issued: Yes (comment) Precaution Comments: Reviewed handout  and pt was cued for precautions during functional mobility. Required Braces or Orthoses: Spinal Brace Spinal Brace: Lumbar corset;Applied in sitting position Restrictions Weight Bearing Restrictions: No       Mobility Bed Mobility Overal bed mobility: Needs Assistance Bed Mobility: Rolling;Sidelying to Sit Rolling: Min assist Sidelying to sit: Min assist       General bed mobility comments: min assist for log rolling to L EOB    Transfers Overall transfer level: Needs assistance Equipment used: Rolling walker (2 wheeled) Transfers: Sit to/from Stand Sit to Stand: Min assist;+2 safety/equipment         General transfer comment: MinA to power up and steady with transfers to stand from various surfaces, cuing for hand placement. +2 for safety    Balance Overall balance assessment: Needs assistance Sitting-balance support: No upper extremity supported;Feet supported Sitting balance-Leahy Scale: Fair Sitting balance - Comments: Static sitting EOB with supervision.   Standing balance support: Single extremity supported;No upper extremity supported;Bilateral upper extremity supported Standing balance-Leahy Scale: Fair Standing balance comment: Able to stand statically without UE support briefly, uses 1 UE support to perform dynamic tasks at sink. Bil UE support to functionally ambulate and perform ADLs.       ADL either performed or assessed with clinical judgement   ADL Overall ADL's : Needs assistance/impaired Eating/Feeding: Set up;Supervision/ safety;Sitting Eating/Feeding Details (indicate cue type and reason): for eating lunch sitting in chair Grooming: Minimal assistance;Min guard;Standing;Wash/dry hands;Oral care;Wash/dry face Grooming Details (indicate cue type and reason): with a RW support standing at sink       Toilet Transfer: Minimal assistance;Cueing for safety;Cueing for sequencing;Ambulation;RW Toilet Transfer Details (indicate cue type and reason): min  assist x1 for sit>stand from EOB with RW, min assist for functional mobility from bed>sink>chair using RW, extra +1  for line management         Functional mobility during ADLs: Minimal assistance;Cueing for safety;Rolling walker       Cognition Arousal/Alertness: Awake/alert Behavior During Therapy: WFL for tasks assessed/performed Overall Cognitive Status: Impaired/Different from baseline Area of Impairment: Memory     Memory: Decreased short-term memory         General Comments: Pt could not remembering anything from yesterday due to being so lethargic and hallucinating yesterday. Needs cues to recall spinal precautions.              General Comments HR elevated up to 129bpm with activity- nursing aware, SPO2 WFL on RA    Pertinent Vitals/ Pain       Pain Assessment: 0-10 Faces Pain Scale: Hurts a little bit Pain Location: Low back (incisional) Pain Descriptors / Indicators: Sore;Grimacing;Operative site guarding Pain Intervention(s): Limited activity within patient's tolerance;Repositioned;Relaxation   Frequency  Min 2X/week        Progress Toward Goals  OT Goals(current goals can now be found in the care plan section)  Progress towards OT goals: Progressing toward goals  Acute Rehab OT Goals Patient Stated Goal: to improve and go home OT Goal Formulation: With patient/family Time For Goal Achievement: 03/14/21 Potential to Achieve Goals: Good ADL Goals Pt Will Perform Grooming: with modified independence;standing Pt Will Perform Upper Body Dressing: with modified independence;sitting Pt Will Perform Lower Body Dressing: with modified independence;sit to/from stand;with adaptive equipment Pt Will Transfer to Toilet: with modified independence;ambulating Pt Will Perform Toileting - Clothing Manipulation and hygiene: with modified independence;sit to/from stand Pt Will Perform Tub/Shower Transfer: 3 in 1;rolling walker Additional ADL Goal #1: Patient will  recall 3/3 spinal precautions without cues, demonstrating good return demo during ADLs.  Plan Discharge plan remains appropriate    Co-evaluation    PT/OT/SLP Co-Evaluation/Treatment: Yes Reason for Co-Treatment: For patient/therapist safety;To address functional/ADL transfers;Other (comment) (line management) PT goals addressed during session: Mobility/safety with mobility;Balance;Proper use of DME OT goals addressed during session: ADL's and self-care;Proper use of Adaptive equipment and DME;Other (comment) (safety awareness, education- spinal precautions and log rolling)      AM-PAC OT "6 Clicks" Daily Activity     Outcome Measure   Help from another person eating meals?: None Help from another person taking care of personal grooming?: A Little Help from another person toileting, which includes using toliet, bedpan, or urinal?: A Lot   Help from another person to put on and taking off regular upper body clothing?: A Little Help from another person to put on and taking off regular lower body clothing?: A Lot 6 Click Score: 14    End of Session Equipment Utilized During Treatment: Back brace  OT Visit Diagnosis: Unsteadiness on feet (R26.81);Muscle weakness (generalized) (M62.81);Pain Pain - part of body:  (back)   Activity Tolerance Patient limited by fatigue;Patient limited by pain   Patient Left in chair;with call bell/phone within reach;with family/visitor present   Nurse Communication Mobility status      Time: [id number] OT Time Calculation (min): 30 min  Charges: OT General Charges $OT Visit: 1 Visit OT Treatments $Self Care/Home Management : 8-22 mins  Michel Bickers, OTR/L [facility] [place] 03/02/2021, 3:35 PM

## 2021-03-02 NOTE — Progress Notes (Signed)
Multiple  extra large type 7 loose  stools overnight.

## 2021-03-02 NOTE — Progress Notes (Signed)
PROGRESS NOTE    Janice Pace  [id number] DOB: 1946-06-25 DOA: 02/27/2021 PCP: Leeroy Cha, MD  Primary Service: Neurosurgery  Brief Narrative: Janice Pace is a 75 y.o. female with a history of GERD, hyperlipidemia, degenerative disc disease and foraminal stenosis of multiple lumbar spine levels. Patient was admitted for spine surgery and medicine was consulted for sinus tachycardia, possible UTI. General surgery consulted for ileus.   Assessment & Plan:   Active Problems:   Hyperlipidemia   Spinal stenosis of lumbar region   Acute lower UTI   Colitis   Ileus (HCC)   Dehydration   GERD (gastroesophageal reflux disease)   Sinus tachycardia Confirmed sinus rhythm on EKG. In setting of possible colitis in addition to ileus. Patient's urine suggest possible dehydration as well. Started on NS IV fluids. -Continue IV fluids @ 75 ml/hr; will not increase with history of grade 2 diastolic heart dysfunction  Dehydration IV fluids as mentioned above. Urinalysis very concentrated.  Possible UTI Urinalysis suggests possible infection. Patient with suprapubic tenderness. Already started on Ciprofloxacin -Continue Ciprofloxacin -Urine culture  Ileus Possible mild colitis General surgery consulted and are managing. Patient is having bowel movements today. C. Difficile testing sent ordered by primary.  Bilirubinuria Unsure of etiology -CMP  Hyperlipidemia -Continue Crestor  GERD -Continue Protonix  Chronic diastolic heart dysfunction No heart failure symptoms/signs. Stable.  Spinal stenosis s/p spinal fixation Per primary   DVT prophylaxis: Per primary Code Status:   Code Status: Full Code Family Communication: Husband and daughter at bedside Disposition Plan: Per primary   Consultants:  General surgery General medicine  Procedures:  ANTERIOR LATERAL INTERBODY FUSION L2-3, L3-4 (02/27/2021)  Antimicrobials: Ciprofloxacin     Subjective: Abdominal pain. No nausea or vomiting. Many bowel movements; per nursing flowsheet, these were type 7 stools  Objective: Vitals:   03/02/21 0042 03/02/21 0044 03/02/21 0310 03/02/21 0732  BP: 125/86 125/86 118/67 114/62  Pulse: (!) 128 (!) 128 (!) 120 (!) 122  Resp: (!) 22 20 (!) 25 (!) 25  Temp: 98.2 F (36.8 C) 98.3 F (36.8 C) 98.1 F (36.7 C) 99.8 F (37.7 C)  TempSrc: Oral Oral Oral Oral  SpO2: 94% 94% 98% 93%  Weight:      Height:        Intake/Output Summary (Last 24 hours) at 03/02/2021 0812 Last data filed at 03/02/2021 0600 Gross per 24 hour  Intake 675 ml  Output 1460 ml  Net -785 ml   Filed Weights   02/27/21 0709  Weight: 87.5 kg    Examination:  General exam: Appears calm and comfortable Respiratory system: Clear to auscultation. Respiratory effort normal. Cardiovascular system: S1 & S2 heard, tachycardia with normal rhythm. No murmurs, rubs, gallops or clicks. Gastrointestinal system: Abdomen is distended, soft and diffusely mildly tender with additional suprapubic tenderness. No organomegaly or masses felt. Minimal bowel sounds heard. Central nervous system: Alert and oriented. Musculoskeletal: No edema. No calf tenderness Skin: No cyanosis. No rashes Psychiatry: Judgement and insight appear normal. Mood & affect appropriate.     Data Reviewed: I have personally reviewed following labs and imaging studies  CBC Lab Results  Component Value Date   WBC 11.7 (H) 03/01/2021   RBC 3.87 03/01/2021   HGB 12.5 03/01/2021   HCT 36.2 03/01/2021   MCV 93.5 03/01/2021   MCH 32.3 03/01/2021   PLT 157 03/01/2021   MCHC 34.5 03/01/2021   RDW 13.0 03/01/2021   LYMPHSABS 0.6 (L) 03/01/2021   MONOABS 1.4 (  H) 03/01/2021   EOSABS 0.0 03/01/2021   BASOSABS 0.0 0000000     Last metabolic panel Lab Results  Component Value Date   NA 135 03/01/2021   K 4.1 03/01/2021   CL 104 03/01/2021   CO2 22 03/01/2021   BUN 16 03/01/2021    CREATININE 1.06 (H) 03/01/2021   GLUCOSE 137 (H) 03/01/2021   GFRNONAA 55 (L) 03/01/2021   GFRAA 100 04/24/2020   CALCIUM 9.8 03/01/2021   PROT 6.6 04/24/2020   ALBUMIN 4.5 04/24/2020   LABGLOB 2.1 04/24/2020   AGRATIO 2.1 04/24/2020   BILITOT 0.6 04/24/2020   ALKPHOS 94 04/24/2020   AST 14 04/24/2020   ALT 13 04/24/2020   ANIONGAP 9 03/01/2021    CBG (last 3)  No results for input(s): GLUCAP in the last 72 hours.   GFR: Estimated Creatinine Clearance: 47.9 mL/min (A) (by C-G formula based on SCr of 1.06 mg/dL (H)).  Coagulation Profile: No results for input(s): INR, PROTIME in the last 168 hours.  Recent Results (from the past 240 hour(s))  SARS Coronavirus 2 (TAT 6-24 hrs)     Status: None   Collection Time: 02/25/21 12:00 AM  Result Value Ref Range Status   SARS Coronavirus 2 RESULT: NEGATIVE  Final    Comment: RESULT: NEGATIVESARS-CoV-2 INTERPRETATION:A NEGATIVE  test result means that SARS-CoV-2 RNA was not present in the specimen above the limit of detection of this test. This does not preclude a possible SARS-CoV-2 infection and should not be used as the  sole basis for patient management decisions. Negative results must be combined with clinical observations, patient history, and epidemiological information. Optimum specimen types and timing for peak viral levels during infections caused by SARS-CoV-2  have not been determined. Collection of multiple specimens or types of specimens may be necessary to detect virus. Improper specimen collection and handling, sequence variability under primers/probes, or organism present below the limit of detection may  lead to false negative results. Positive and negative predictive values of testing are highly dependent on prevalence. False negative test results are more likely when prevalence of disease is high.The expected result is NEGATIVE.Fact S heet for  Healthcare Providers: [email or web address].no Sheet for  Patients: [email or web address] Reference Range - Negative         Radiology Studies: DG Abd 1 View  Result Date: 03/01/2021 CLINICAL DATA:  Abdominal distension, recent surgery EXAM: ABDOMEN - 1 VIEW COMPARISON:  None. FINDINGS: There is diffuse gaseous distension and dilation of small bowel/colon and likely of the stomach. Lumbosacral fusion hardware appears intact. There is a drain/tubing coursing along the right hemiabdomen, possibly in the back. IMPRESSION: Diffuse gaseous dilation of bowel compatible with adynamic ileus. Electronically Signed   By: Maurine Simmering M.D.   On: 03/01/2021 18:32   CT ABDOMEN PELVIS W CONTRAST  Result Date: 03/02/2021 CLINICAL DATA:  Concern for bowel obstruction. EXAM: CT ABDOMEN AND PELVIS WITH CONTRAST TECHNIQUE: Multidetector CT imaging of the abdomen and pelvis was performed using the standard protocol following bolus administration of intravenous contrast. CONTRAST:  130m OMNIPAQUE IOHEXOL 350 MG/ML SOLN COMPARISON:  Abdominal radiograph dated 03/01/2021 FINDINGS: Lower chest: Bibasilar linear atelectasis/scarring. The visualized lung bases are otherwise clear. No intra-abdominal free air.  Small free fluid in the pelvis. Hepatobiliary: Fatty liver. No intrahepatic biliary dilatation. The gallbladder is unremarkable. Pancreas: Unremarkable. No pancreatic ductal dilatation or surrounding inflammatory changes. Spleen: Normal in size without focal abnormality. Adrenals/Urinary Tract: Indeterminate bilateral adrenal nodules measuring 15 mm on the left. There is  no hydronephrosis on either side. There is symmetric enhancement and excretion of contrast by both kidneys. The visualized ureters appear unremarkable. The urinary bladder is decompressed around a Foley catheter. Stomach/Bowel: Small hiatal hernia. Loose stool noted throughout the colon compatible with diarrheal state. Correlation with clinical exam and stool cultures recommended. No  evidence of bowel obstruction. Mild hazy appearance of the colonic wall may be reactive or represent mild colitis. The appendix is not visualized with certainty. No inflammatory changes identified in the right lower quadrant. Vascular/Lymphatic: Advanced aortoiliac atherosclerotic disease. The IVC is unremarkable. No portal venous gas. There is no adenopathy. Reproductive: Hysterectomy.  No adnexal masses. Other: Small fat containing umbilical hernia. Musculoskeletal: Multilevel degenerative changes of the spine and lumbar fusion. No acute osseous pathology. IMPRESSION: 1. Diarrheal state with possible mild colitis. Correlation with clinical exam and stool cultures recommended. No bowel obstruction. 2. Fatty liver. 3. Indeterminate bilateral adrenal nodules measuring 15 mm on the left. 4. Aortic Atherosclerosis (ICD10-I70.0). Electronically Signed   By: Anner Crete M.D.   On: 03/02/2021 00:57        Scheduled Meds:  ascorbic acid  1,000 mg Oral Daily   aspirin EC  81 mg Oral Daily   calcium-vitamin D  2 tablet Oral Once per day on Mon Wed Fri   Chlorhexidine Gluconate Cloth  6 each Topical Daily   ciprofloxacin  500 mg Oral BID   docusate sodium  500 mg Oral QHS   loratadine  10 mg Oral Daily   multivitamin  1 tablet Oral Daily   pantoprazole  40 mg Oral Daily   rosuvastatin  10 mg Oral Daily   sodium chloride flush  3 mL Intravenous Q12H   Continuous Infusions:  sodium chloride 250 mL (03/01/21 1146)   sodium chloride 75 mL/hr at 03/02/21 0651     LOS: 3 days     Cordelia Poche, MD [facility] 03/02/2021, 8:12 AM  If 7PM-7AM, please contact night-coverage [email or web address]

## 2021-03-02 NOTE — Progress Notes (Signed)
Subjective: Patient reports  feels better this morning more awake more alert did have bowel movements last night  Objective: Vital signs in last 24 hours: Temp:  [97.6 F (36.4 C)-99.8 F (37.7 C)] 99.8 F (37.7 C) (08/20 0732) Pulse Rate:  [120-128] 122 (08/20 0732) Resp:  [17-25] 25 (08/20 0732) BP: (114-128)/(62-86) 114/62 (08/20 0732) SpO2:  [90 %-100 %] 93 % (08/20 0732)  Intake/Output from previous day: 08/19 0701 - 08/20 0700 In: 675 [I.V.:675] Out: 1460 [Urine:1400; Drains:60] Intake/Output this shift: No intake/output data recorded.  Patient is awake and alert moves all extremities condition is some mild to moderate back pain but manageable no leg pain  Lab Results: Recent Labs    03/01/21 0903  WBC 11.7*  HGB 12.5  HCT 36.2  PLT 157   BMET Recent Labs    03/01/21 0903  NA 135  K 4.1  CL 104  CO2 22  GLUCOSE 137*  BUN 16  CREATININE 1.06*  CALCIUM 9.8    Studies/Results: DG Abd 1 View  Result Date: 03/01/2021 CLINICAL DATA:  Abdominal distension, recent surgery EXAM: ABDOMEN - 1 VIEW COMPARISON:  None. FINDINGS: There is diffuse gaseous distension and dilation of small bowel/colon and likely of the stomach. Lumbosacral fusion hardware appears intact. There is a drain/tubing coursing along the right hemiabdomen, possibly in the back. IMPRESSION: Diffuse gaseous dilation of bowel compatible with adynamic ileus. Electronically Signed   By: Maurine Simmering M.D.   On: 03/01/2021 18:32   CT ABDOMEN PELVIS W CONTRAST  Result Date: 03/02/2021 CLINICAL DATA:  Concern for bowel obstruction. EXAM: CT ABDOMEN AND PELVIS WITH CONTRAST TECHNIQUE: Multidetector CT imaging of the abdomen and pelvis was performed using the standard protocol following bolus administration of intravenous contrast. CONTRAST:  141m OMNIPAQUE IOHEXOL 350 MG/ML SOLN COMPARISON:  Abdominal radiograph dated 03/01/2021 FINDINGS: Lower chest: Bibasilar linear atelectasis/scarring. The visualized lung  bases are otherwise clear. No intra-abdominal free air.  Small free fluid in the pelvis. Hepatobiliary: Fatty liver. No intrahepatic biliary dilatation. The gallbladder is unremarkable. Pancreas: Unremarkable. No pancreatic ductal dilatation or surrounding inflammatory changes. Spleen: Normal in size without focal abnormality. Adrenals/Urinary Tract: Indeterminate bilateral adrenal nodules measuring 15 mm on the left. There is no hydronephrosis on either side. There is symmetric enhancement and excretion of contrast by both kidneys. The visualized ureters appear unremarkable. The urinary bladder is decompressed around a Foley catheter. Stomach/Bowel: Small hiatal hernia. Loose stool noted throughout the colon compatible with diarrheal state. Correlation with clinical exam and stool cultures recommended. No evidence of bowel obstruction. Mild hazy appearance of the colonic wall may be reactive or represent mild colitis. The appendix is not visualized with certainty. No inflammatory changes identified in the right lower quadrant. Vascular/Lymphatic: Advanced aortoiliac atherosclerotic disease. The IVC is unremarkable. No portal venous gas. There is no adenopathy. Reproductive: Hysterectomy.  No adnexal masses. Other: Small fat containing umbilical hernia. Musculoskeletal: Multilevel degenerative changes of the spine and lumbar fusion. No acute osseous pathology. IMPRESSION: 1. Diarrheal state with possible mild colitis. Correlation with clinical exam and stool cultures recommended. No bowel obstruction. 2. Fatty liver. 3. Indeterminate bilateral adrenal nodules measuring 15 mm on the left. 4. Aortic Atherosclerosis (ICD10-I70.0). Electronically Signed   By: AAnner CreteM.D.   On: 03/02/2021 00:57    Assessment/Plan: Postop day 3 anterior posterior spinal fixation looks like she is turning the corner extensive work-up last night included CT scan abdomen pelvis which was consistent with ileus but no other  complicating  features patient certainly is much better but has had multiple bowel movement so we will test for C. difficile.  Appreciate general surgery and internal medicine's help with managing tachycardia and distended abdomen.  LOS: 3 days     Elaina Hoops 03/02/2021, 8:51 AM

## 2021-03-02 NOTE — Progress Notes (Signed)
Report given to receiving RN in 4NP. Patient enroute to CT at this time.

## 2021-03-02 NOTE — Progress Notes (Signed)
Physical Therapy Treatment Patient Details Name: Janice Pace MRN: [id number] DOB: 05/23/1946 Today's Date: 03/02/2021    History of Present Illness Pt is a 75 y/o female presenting with radicular lumbar symptoms s/p elective L2-L4 ALIF on 02/27/2021. Post-surgical complication of CT scan of abdomen pelvis was consistent with ileus. PMHx significant for several back surgeries including previous L4-S1 fusion in 2013, CA, heart murmur and valve disorder, pre-diabetes.    PT Comments    Treated pt in conjunction with OT to maximize pt safety and quality of session as pt had a significant decline in functional status yesterday. However, pt was awake and able to participate more today. Pt required minA for all functional mobility. She is at risk for falls as she ambulates at a slow pace and has poor RW management, despite repeated cues to correct. Will continue to follow acutely. Current recommendations remain appropriate.    Follow Up Recommendations  Home health PT;Supervision for mobility/OOB     Equipment Recommendations  Rolling walker with 5" wheels;3in1 (PT)    Recommendations for Other Services       Precautions / Restrictions Precautions Precautions: Back;Fall Precaution Booklet Issued: Yes (comment) Precaution Comments: Reviewed handout and pt was cued for precautions during functional mobility. Required Braces or Orthoses: Spinal Brace Spinal Brace: Lumbar corset;Applied in sitting position Restrictions Weight Bearing Restrictions: No    Mobility  Bed Mobility Overal bed mobility: Needs Assistance Bed Mobility: Rolling;Sidelying to Sit Rolling: Min assist Sidelying to sit: Min assist       General bed mobility comments: MinA to facilitate and cue pt to log roll and transition sidelying > sit L EOB.    Transfers Overall transfer level: Needs assistance Equipment used: Rolling walker (2 wheeled) Transfers: Sit to/from Stand Sit to Stand: Min assist;+2  safety/equipment         General transfer comment: MinA to power up and steady with transfers to stand from various surfaces, cuing for hand placement. +2 for safety  Ambulation/Gait Ambulation/Gait assistance: Min assist;Min guard;+2 safety/equipment Gait Distance (Feet): 55 Feet (x2 bouts of ~55 ft > ~10 ft) Assistive device: Rolling walker (2 wheeled) Gait Pattern/deviations: Step-through pattern;Decreased stride length;Trunk flexed Gait velocity: Decreased Gait velocity interpretation: <1.8 ft/sec, indicate of risk for recurrent falls General Gait Details: Pt needing repeated cues to keep RW proximal to her. tactile and verbal cues provided to adduct scapulas to improve upright posture, momentary success. No overt LOB, pt correctling self to extend knees in standing intermittently. MinA-min guard assist to steady and manage RW, +2 for safety.   Stairs         General stair comments: Discussed and educated pt on negotiating stairs either with bil hands on rail sideways or forward with 1 hand on rail and other hand on person to assist her   Wheelchair Mobility    Modified Rankin (Stroke Patients Only)       Balance Overall balance assessment: Needs assistance Sitting-balance support: No upper extremity supported;Feet supported Sitting balance-Leahy Scale: Fair Sitting balance - Comments: Static sitting EOB with supervision.   Standing balance support: Single extremity supported;No upper extremity supported;Bilateral upper extremity supported Standing balance-Leahy Scale: Fair Standing balance comment: Able to stand statically without UE support briefly, uses 1 UE support to perform dynamic tasks at sink. Bil UE support to ambulate.                            Cognition Arousal/Alertness: Awake/alert Behavior  During Therapy: WFL for tasks assessed/performed Overall Cognitive Status: Impaired/Different from baseline Area of Impairment: Memory                      Memory: Decreased short-term memory         General Comments: Pt could not remembering anything from yesterday due to being so lethargic and hallucinating yesterday. Needs cues to recall spinal precautions.      Exercises      General Comments General comments (skin integrity, edema, etc.): HR up to 129 bpm with mobility, SpO2 >/= 95% throughout on RA      Pertinent Vitals/Pain Pain Assessment: Faces Faces Pain Scale: Hurts a little bit Pain Location: Low back (incisional) Pain Descriptors / Indicators: Sore;Grimacing;Operative site guarding Pain Intervention(s): Limited activity within patient's tolerance;Monitored during session;Repositioned    Home Living                      Prior Function            PT Goals (current goals can now be found in the care plan section) Acute Rehab PT Goals Patient Stated Goal: to improve and go home PT Goal Formulation: With patient/family Time For Goal Achievement: 03/07/21 Potential to Achieve Goals: Good Progress towards PT goals: Progressing toward goals    Frequency    Min 5X/week      PT Plan Current plan remains appropriate    Co-evaluation PT/OT/SLP Co-Evaluation/Treatment: Yes Reason for Co-Treatment: For patient/therapist safety;To address functional/ADL transfers PT goals addressed during session: Mobility/safety with mobility;Balance;Proper use of DME        AM-PAC PT "6 Clicks" Mobility   Outcome Measure  Help needed turning from your back to your side while in a flat bed without using bedrails?: A Little Help needed moving from lying on your back to sitting on the side of a flat bed without using bedrails?: A Little Help needed moving to and from a bed to a chair (including a wheelchair)?: A Little Help needed standing up from a chair using your arms (e.g., wheelchair or bedside chair)?: A Little Help needed to walk in hospital room?: A Little Help needed climbing 3-5 steps with a  railing? : A Lot 6 Click Score: 17    End of Session Equipment Utilized During Treatment: Back brace Activity Tolerance: Patient tolerated treatment well Patient left: with call bell/phone within reach;with family/visitor present;in chair;with chair alarm set Nurse Communication: Mobility status;Other (comment) (vitals) PT Visit Diagnosis: Unsteadiness on feet (R26.81);Pain;Other abnormalities of gait and mobility (R26.89);Difficulty in walking, not elsewhere classified (R26.2) Pain - part of body:  (back)     Time: [id number] PT Time Calculation (min) (ACUTE ONLY): 30 min  Charges:  $Gait Training: 8-22 mins                     Moishe Spice, PT, DPT Acute Rehabilitation Services  Pager: [phone] Office: [place] 03/02/2021, 2:05 PM

## 2021-03-03 LAB — BASIC METABOLIC PANEL
Anion gap: 6 (ref 5–15)
BUN: 14 mg/dL (ref 8–23)
CO2: 22 mmol/L (ref 22–32)
Calcium: 7.9 mg/dL — ABNORMAL LOW (ref 8.9–10.3)
Chloride: 107 mmol/L (ref 98–111)
Creatinine, Ser: 0.7 mg/dL (ref 0.44–1.00)
GFR, Estimated: >60 mL/min (ref >60)
Glucose, Bld: 152 mg/dL — ABNORMAL HIGH (ref 70–99)
Potassium: 4 mmol/L (ref 3.5–5.1)
Sodium: 135 mmol/L (ref 135–145)

## 2021-03-03 LAB — C DIFFICILE (CDIFF) QUICK SCRN (NO PCR REFLEX)
C Diff antigen: NEGATIVE
C Diff interpretation: NOT DETECTED
C Diff toxin: NEGATIVE

## 2021-03-03 NOTE — Progress Notes (Signed)
Physical Therapy Treatment Patient Details Name: Janice Pace MRN: [id number] DOB: Apr 15, 1946 Today's Date: 03/03/2021    History of Present Illness Pt is a 75 y/o female presenting with radicular lumbar symptoms s/p elective L2-L4 ALIF on 02/27/2021. Post-surgical complication of CT scan of abdomen pelvis was consistent with ileus. PMHx significant for several back surgeries including previous L4-S1 fusion in 2013, CA, heart murmur and valve disorder, pre-diabetes.    PT Comments    Pt is making great progress with mobility, only needing min guard assist for safety with bed mobility, transfers, and household distance gait on even surfaces with a RW this date. Pt needs 1 HHA minA with use of rail on contralateral side for stability with stair negotiation. Educated pt and family on safe guarding. Pt with poor recall of spinal precautions and needs cues to comply intermittently. Will continue to follow acutely. Current recommendations remain appropriate.   Follow Up Recommendations  Home health PT;Supervision for mobility/OOB     Equipment Recommendations  Rolling walker with 5" wheels;3in1 (PT)    Recommendations for Other Services       Precautions / Restrictions Precautions Precautions: Back;Fall Precaution Booklet Issued: Yes (comment) Precaution Comments: Reviewed handout and pt was cued for precautions during functional mobility. Required Braces or Orthoses: Spinal Brace Spinal Brace: Lumbar corset;Applied in sitting position Restrictions Weight Bearing Restrictions: No    Mobility  Bed Mobility Overal bed mobility: Needs Assistance Bed Mobility: Rolling;Sidelying to Sit Rolling: Min guard Sidelying to sit: Min guard       General bed mobility comments: Cues to log roll and transition sidelying > sit L EOB pushing up off bed surface with UEs, extra time but success with min guard assist. bed flat and no use of rails to simulate home.    Transfers Overall transfer  level: Needs assistance Equipment used: Rolling walker (2 wheeled) Transfers: Sit to/from Stand Sit to Stand: Min guard         General transfer comment: Sit to stand x3 reps during session with cues for hand placement, min guard asisst and extra time to power up to stand.  Ambulation/Gait Ambulation/Gait assistance: Min guard Gait Distance (Feet): 100 Feet (x2 bouts of ~100 ft) Assistive device: Rolling walker (2 wheeled) Gait Pattern/deviations: Step-through pattern;Decreased stride length Gait velocity: Decreased Gait velocity interpretation: <1.8 ft/sec, indicate of risk for recurrent falls General Gait Details: Pt with improved upright posture today, cues provided to improve feet clearance and to stand to rest as needed to allow pt to go further distances before fatiguing and needing to sit. No LOB, min guard assist for safety.   Stairs Stairs: Yes Stairs assistance: Min assist Stair Management: One rail Left;One rail Right;Step to pattern;Forwards Number of Stairs: 2 General stair comments: x1 HHA minA with contralateral hand on rail, displaying step-to gait pattern. No LOB.   Wheelchair Mobility    Modified Rankin (Stroke Patients Only)       Balance Overall balance assessment: Needs assistance Sitting-balance support: No upper extremity supported;Feet supported Sitting balance-Leahy Scale: Fair Sitting balance - Comments: Static sitting EOB with supervision.   Standing balance support: Single extremity supported;No upper extremity supported;Bilateral upper extremity supported Standing balance-Leahy Scale: Fair Standing balance comment: Able to stand statically without UE support briefly, but UE support for mobility.                            Cognition Arousal/Alertness: Awake/alert Behavior During Therapy: WFL for  tasks assessed/performed Overall Cognitive Status: Impaired/Different from baseline Area of Impairment: Memory                      Memory: Decreased short-term memory;Decreased recall of precautions         General Comments: Pt with difficulty recalling spinal precautions of no twisting or lifting and needing reminders to comply throughout session.      Exercises      General Comments General comments (skin integrity, edema, etc.): HR up to 120s noted, SpO2 >/= 95% on RA      Pertinent Vitals/Pain Pain Assessment: Faces Faces Pain Scale: Hurts a little bit Pain Location: Low back (incisional) Pain Descriptors / Indicators: Sore;Grimacing;Operative site guarding Pain Intervention(s): Monitored during session;Limited activity within patient's tolerance;Repositioned    Home Living                      Prior Function            PT Goals (current goals can now be found in the care plan section) Acute Rehab PT Goals Patient Stated Goal: to improve and go home PT Goal Formulation: With patient/family Time For Goal Achievement: 03/07/21 Potential to Achieve Goals: Good Progress towards PT goals: Progressing toward goals    Frequency    Min 5X/week      PT Plan Current plan remains appropriate    Co-evaluation              AM-PAC PT "6 Clicks" Mobility   Outcome Measure  Help needed turning from your back to your side while in a flat bed without using bedrails?: A Little Help needed moving from lying on your back to sitting on the side of a flat bed without using bedrails?: A Little Help needed moving to and from a bed to a chair (including a wheelchair)?: A Little Help needed standing up from a chair using your arms (e.g., wheelchair or bedside chair)?: A Little Help needed to walk in hospital room?: A Little Help needed climbing 3-5 steps with a railing? : A Little 6 Click Score: 18    End of Session Equipment Utilized During Treatment: Back brace;Gait belt Activity Tolerance: Patient tolerated treatment well Patient left: with call bell/phone within reach;with  family/visitor present;in chair;with chair alarm set Nurse Communication: Mobility status PT Visit Diagnosis: Unsteadiness on feet (R26.81);Pain;Other abnormalities of gait and mobility (R26.89);Difficulty in walking, not elsewhere classified (R26.2) Pain - part of body:  (back)     Time: [id number] PT Time Calculation (min) (ACUTE ONLY): 24 min  Charges:  $Gait Training: 23-37 mins                     Moishe Spice, PT, DPT Acute Rehabilitation Services  Pager: [phone] Office: [place] 03/03/2021, 10:58 AM

## 2021-03-03 NOTE — Progress Notes (Addendum)
Subjective: Patient reports some back pain but tolerable. Reports walking yesterday in the room. Eager to eat.   Objective: Vital signs in last 24 hours: Temp:  [97.1 F (36.2 C)-98.9 F (37.2 C)] 97.1 F (36.2 C) (08/21 0732) Pulse Rate:  [103-122] 103 (08/21 0732) Resp:  [17-24] 19 (08/21 0732) BP: (115-146)/(59-75) 126/60 (08/21 0732) SpO2:  [94 %-97 %] 96 % (08/21 0732)  Intake/Output from previous day: 08/20 0701 - 08/21 0700 In: 2942.5 [P.O.:600; I.V.:2342.5] Out: 1100 [Urine:1100] Intake/Output this shift: No intake/output data recorded.  Neurologic: Grossly normal  Lab Results: Lab Results  Component Value Date   WBC 11.7 (H) 03/01/2021   HGB 12.5 03/01/2021   HCT 36.2 03/01/2021   MCV 93.5 03/01/2021   PLT 157 03/01/2021   No results found for: INR, PROTIME BMET Lab Results  Component Value Date   NA 135 03/03/2021   K 4.0 03/03/2021   CL 107 03/03/2021   CO2 22 03/03/2021   GLUCOSE 152 (H) 03/03/2021   BUN 14 03/03/2021   CREATININE 0.70 03/03/2021   CALCIUM 7.9 (L) 03/03/2021    Studies/Results: DG Abd 1 View  Result Date: 03/01/2021 CLINICAL DATA:  Abdominal distension, recent surgery EXAM: ABDOMEN - 1 VIEW COMPARISON:  None. FINDINGS: There is diffuse gaseous distension and dilation of small bowel/colon and likely of the stomach. Lumbosacral fusion hardware appears intact. There is a drain/tubing coursing along the right hemiabdomen, possibly in the back. IMPRESSION: Diffuse gaseous dilation of bowel compatible with adynamic ileus. Electronically Signed   By: Maurine Simmering M.D.   On: 03/01/2021 18:32   CT ABDOMEN PELVIS W CONTRAST  Result Date: 03/02/2021 CLINICAL DATA:  Concern for bowel obstruction. EXAM: CT ABDOMEN AND PELVIS WITH CONTRAST TECHNIQUE: Multidetector CT imaging of the abdomen and pelvis was performed using the standard protocol following bolus administration of intravenous contrast. CONTRAST:  124m OMNIPAQUE IOHEXOL 350 MG/ML SOLN  COMPARISON:  Abdominal radiograph dated 03/01/2021 FINDINGS: Lower chest: Bibasilar linear atelectasis/scarring. The visualized lung bases are otherwise clear. No intra-abdominal free air.  Small free fluid in the pelvis. Hepatobiliary: Fatty liver. No intrahepatic biliary dilatation. The gallbladder is unremarkable. Pancreas: Unremarkable. No pancreatic ductal dilatation or surrounding inflammatory changes. Spleen: Normal in size without focal abnormality. Adrenals/Urinary Tract: Indeterminate bilateral adrenal nodules measuring 15 mm on the left. There is no hydronephrosis on either side. There is symmetric enhancement and excretion of contrast by both kidneys. The visualized ureters appear unremarkable. The urinary bladder is decompressed around a Foley catheter. Stomach/Bowel: Small hiatal hernia. Loose stool noted throughout the colon compatible with diarrheal state. Correlation with clinical exam and stool cultures recommended. No evidence of bowel obstruction. Mild hazy appearance of the colonic wall may be reactive or represent mild colitis. The appendix is not visualized with certainty. No inflammatory changes identified in the right lower quadrant. Vascular/Lymphatic: Advanced aortoiliac atherosclerotic disease. The IVC is unremarkable. No portal venous gas. There is no adenopathy. Reproductive: Hysterectomy.  No adnexal masses. Other: Small fat containing umbilical hernia. Musculoskeletal: Multilevel degenerative changes of the spine and lumbar fusion. No acute osseous pathology. IMPRESSION: 1. Diarrheal state with possible mild colitis. Correlation with clinical exam and stool cultures recommended. No bowel obstruction. 2. Fatty liver. 3. Indeterminate bilateral adrenal nodules measuring 15 mm on the left. 4. Aortic Atherosclerosis (ICD10-I70.0). Electronically Signed   By: AAnner CreteM.D.   On: 03/02/2021 00:57    Assessment/Plan: S/p XLIF and posterior instrumentation. Doing better today from  a pain standpoint.  Tachycardia:  HR seems to be more controlled.  Ileus: she had bowel movements yesterday. Ok to advanced diet as tolerated if general surgery is ok with it.  Urinary retention- trial foley removal today, urine labwork looks ok   Mobilize in the hallway today     LOS: 4 days    Ocie [facility] 03/03/2021, 8:37 AM

## 2021-03-03 NOTE — Progress Notes (Signed)
ileus  Subjective: Pt with less BM's yesterday.  Denies any nausea currently, no flatus  Objective: Vital signs in last 24 hours: Temp:  [97.1 F (36.2 C)-98.9 F (37.2 C)] 97.1 F (36.2 C) (08/21 0732) Pulse Rate:  [103-122] 103 (08/21 0732) Resp:  [17-24] 19 (08/21 0732) BP: (115-146)/(59-75) 126/60 (08/21 0732) SpO2:  [94 %-97 %] 96 % (08/21 0732) Last BM Date: 03/02/21  Intake/Output from previous day: 08/20 0701 - 08/21 0700 In: 2942.5 [P.O.:600; I.V.:2342.5] Out: 1100 [Urine:1100] Intake/Output this shift: No intake/output data recorded.  General appearance: alert and cooperative GI: distended, mild TTP  Lab Results:  Results for orders placed or performed during the hospital encounter of 02/27/21 (from the past 24 hour(s))  C Difficile Quick Screen (NO PCR Reflex)     Status: None   Collection Time: 03/02/21  7:07 PM   Specimen: STOOL  Result Value Ref Range   C Diff antigen NEGATIVE NEGATIVE   C Diff toxin NEGATIVE NEGATIVE   C Diff interpretation No C. difficile detected.   Osmolality, urine     Status: None   Collection Time: 03/02/21  8:21 PM  Result Value Ref Range   Osmolality, Ur 824 300 - 900 mOsm/kg  Creatinine, urine, random     Status: None   Collection Time: 03/02/21  8:21 PM  Result Value Ref Range   Creatinine, Urine 129.07 mg/dL  Sodium, urine, random     Status: None   Collection Time: 03/02/21  8:21 PM  Result Value Ref Range   Sodium, Ur <10 mmol/L  Basic metabolic panel     Status: Abnormal   Collection Time: 03/03/21  3:56 AM  Result Value Ref Range   Sodium 135 135 - 145 mmol/L   Potassium 4.0 3.5 - 5.1 mmol/L   Chloride 107 98 - 111 mmol/L   CO2 22 22 - 32 mmol/L   Glucose, Bld 152 (H) 70 - 99 mg/dL   BUN 14 8 - 23 mg/dL   Creatinine, Ser 0.70 0.44 - 1.00 mg/dL   Calcium 7.9 (L) 8.9 - 10.3 mg/dL   GFR, Estimated >60 >60 mL/min   Anion gap 6 5 - 15     Studies/Results Radiology     MEDS, Scheduled  ascorbic acid  1,000  mg Oral Daily   aspirin EC  81 mg Oral Daily   calcium-vitamin D  2 tablet Oral Once per day on Mon Wed Fri   Chlorhexidine Gluconate Cloth  6 each Topical Daily   ciprofloxacin  500 mg Oral BID   docusate sodium  500 mg Oral QHS   loratadine  10 mg Oral Daily   multivitamin  1 tablet Oral Daily   pantoprazole  40 mg Oral Daily   rosuvastatin  10 mg Oral Daily   sodium chloride flush  3 mL Intravenous Q12H     Assessment: Post op ileus: seems to be partially resolving  Plan: Would hold with clears.  Waiting for abd distention to improve.    LOS: 4 days    Rosario Adie, MD [facility], [place]    03/03/2021 9:07 AM

## 2021-03-03 NOTE — Progress Notes (Signed)
PROGRESS NOTE    Janice Pace  [id number] DOB: 01-14-1946 DOA: 02/27/2021 PCP: Leeroy Cha, MD  Primary Service: Neurosurgery  Brief Narrative: Janice Pace is a 75 y.o. female with a history of GERD, hyperlipidemia, degenerative disc disease and foraminal stenosis of multiple lumbar spine levels. Patient was admitted for spine surgery and medicine was consulted for sinus tachycardia, possible UTI. General surgery consulted for ileus.   Assessment & Plan:   Active Problems:   Hyperlipidemia   Spinal stenosis of lumbar region   Acute lower UTI   Colitis   Ileus (HCC)   Dehydration   GERD (gastroesophageal reflux disease)   Sinus tachycardia Confirmed sinus rhythm on EKG. In setting of possible colitis in addition to ileus. Patient's urine suggest possible dehydration as well. Started on NS IV fluids with improvement -Continue IV fluids @ 75 ml/hr; will not increase with history of grade 2 diastolic heart dysfunction; likely discontinue tomorrow -Telemetry not needed and can be discontinued at any time from a sinus tachycardia standpoint  Dehydration IV fluids as mentioned above. Urinalysis very concentrated.  Possible UTI Urinalysis suggests possible infection. Patient with suprapubic tenderness. Already started on Ciprofloxacin. Urine culture (obtained after antibiotics) with no growth. Ciprofloxacin can be discontinued after three days of treatment  Ileus Possible mild colitis General surgery consulted and are managing. Patient is having bowel movements today. C. Difficile testing sent ordered by primary.  Bilirubinuria Unsure of etiology. Mild bilirubinemia. Can recheck as an outpatient.  Hyperlipidemia -Continue Crestor  GERD -Continue Protonix  Chronic diastolic heart dysfunction No heart failure symptoms/signs. Stable.  Spinal stenosis s/p spinal fixation Per primary   DVT prophylaxis: Per primary Code Status:   Code Status: Full  Code Family Communication: Daughter at bedside Disposition Plan: Per primary   Consultants:  General surgery General medicine  Procedures:  ANTERIOR LATERAL INTERBODY FUSION L2-3, L3-4 (02/27/2021)  Antimicrobials: Ciprofloxacin    Subjective: No bowel movement. Not passing gas. Burping. Some mild abdominal pain. No other concerns. Hoping to leave the hospital sooner than later.  Objective: Vitals:   03/02/21 2312 03/03/21 0311 03/03/21 0732 03/03/21 1034  BP: 115/63 (!) 129/59 126/60 135/73  Pulse: (!) 107 (!) 103 (!) 103 (!) 108  Resp: '18 17 19 15  '$ Temp: 98 F (36.7 C) 98 F (36.7 C) (!) 97.1 F (36.2 C) 98 F (36.7 C)  TempSrc: Oral Oral Axillary Oral  SpO2: 96% 95% 96% 96%  Weight:      Height:        Intake/Output Summary (Last 24 hours) at 03/03/2021 1354 Last data filed at 03/03/2021 0900 Gross per 24 hour  Intake 2922.52 ml  Output 1100 ml  Net 1822.52 ml    Filed Weights   02/27/21 0709  Weight: 87.5 kg    Examination:  General exam: Appears calm and comfortable Respiratory system: Clear to auscultation. Respiratory effort normal. Cardiovascular system: S1 & S2 heard, RRR. No murmurs, rubs, gallops or clicks. Gastrointestinal system: Abdomen is distended, somewhat tight but still soft and with some mild tenderness in lower quadrants. Normal bowel sounds heard. Central nervous system: Alert and oriented. No focal neurological deficits. Musculoskeletal: No edema. No calf tenderness Skin: No cyanosis. No rashes Psychiatry: Judgement and insight appear normal. Mood & affect appropriate.     Data Reviewed: I have personally reviewed following labs and imaging studies  CBC Lab Results  Component Value Date   WBC 11.7 (H) 03/01/2021   RBC 3.87 03/01/2021   HGB  12.5 03/01/2021   HCT 36.2 03/01/2021   MCV 93.5 03/01/2021   MCH 32.3 03/01/2021   PLT 157 03/01/2021   MCHC 34.5 03/01/2021   RDW 13.0 03/01/2021   LYMPHSABS 0.6 (L) 03/01/2021    MONOABS 1.4 (H) 03/01/2021   EOSABS 0.0 03/01/2021   BASOSABS 0.0 0000000     Last metabolic panel Lab Results  Component Value Date   NA 135 03/03/2021   K 4.0 03/03/2021   CL 107 03/03/2021   CO2 22 03/03/2021   BUN 14 03/03/2021   CREATININE 0.70 03/03/2021   GLUCOSE 152 (H) 03/03/2021   GFRNONAA >60 03/03/2021   GFRAA 100 04/24/2020   CALCIUM 7.9 (L) 03/03/2021   PROT 5.1 (L) 03/02/2021   ALBUMIN 2.5 (L) 03/02/2021   LABGLOB 2.1 04/24/2020   AGRATIO 2.1 04/24/2020   BILITOT 1.3 (H) 03/02/2021   ALKPHOS 47 03/02/2021   AST 33 03/02/2021   ALT 22 03/02/2021   ANIONGAP 6 03/03/2021    CBG (last 3)  No results for input(s): GLUCAP in the last 72 hours.   GFR: Estimated Creatinine Clearance: 63.4 mL/min (by C-G formula based on SCr of 0.7 mg/dL).  Coagulation Profile: No results for input(s): INR, PROTIME in the last 168 hours.  Recent Results (from the past 240 hour(s))  SARS Coronavirus 2 (TAT 6-24 hrs)     Status: None   Collection Time: 02/25/21 12:00 AM  Result Value Ref Range Status   SARS Coronavirus 2 RESULT: NEGATIVE  Final    Comment: RESULT: NEGATIVESARS-CoV-2 INTERPRETATION:A NEGATIVE  test result means that SARS-CoV-2 RNA was not present in the specimen above the limit of detection of this test. This does not preclude a possible SARS-CoV-2 infection and should not be used as the  sole basis for patient management decisions. Negative results must be combined with clinical observations, patient history, and epidemiological information. Optimum specimen types and timing for peak viral levels during infections caused by SARS-CoV-2  have not been determined. Collection of multiple specimens or types of specimens may be necessary to detect virus. Improper specimen collection and handling, sequence variability under primers/probes, or organism present below the limit of detection may  lead to false negative results. Positive and negative predictive values of  testing are highly dependent on prevalence. False negative test results are more likely when prevalence of disease is high.The expected result is NEGATIVE.Fact S heet for  Healthcare Providers: LocalChronicle.no Sheet for Patients: SalonLookup.es Reference Range - Negative   Urine Culture     Status: None (Preliminary result)   Collection Time: 03/02/21  8:49 AM   Specimen: Urine, Clean Catch  Result Value Ref Range Status   Specimen Description URINE, CLEAN CATCH  Final   Special Requests NONE  Final   Culture   Final    NO GROWTH Performed at [facility], [address], [place], [place] [address]    Report Status PENDING  Incomplete  C Difficile Quick Screen (NO PCR Reflex)     Status: None   Collection Time: 03/02/21  7:07 PM   Specimen: STOOL  Result Value Ref Range Status   C Diff antigen NEGATIVE NEGATIVE Final   C Diff toxin NEGATIVE NEGATIVE Final   C Diff interpretation No C. difficile detected.  Final    Comment: Performed at [facility], [place] [address], [place], [place] [address]        Radiology Studies: DG Abd 1 View  Result Date: 03/01/2021 CLINICAL DATA:  Abdominal distension, recent surgery EXAM: ABDOMEN - 1 VIEW COMPARISON:  None. FINDINGS: There is diffuse gaseous distension and dilation of small bowel/colon and likely of the stomach. Lumbosacral fusion hardware appears intact. There is a drain/tubing coursing along the right hemiabdomen, possibly in the back. IMPRESSION: Diffuse gaseous dilation of bowel compatible with adynamic ileus. Electronically Signed   By: Maurine Simmering M.D.   On: 03/01/2021 18:32   CT ABDOMEN PELVIS W CONTRAST  Result Date: 03/02/2021 CLINICAL DATA:  Concern for bowel obstruction. EXAM: CT ABDOMEN AND PELVIS WITH CONTRAST TECHNIQUE: Multidetector CT imaging of the abdomen and pelvis was performed using the standard protocol following bolus administration of  intravenous contrast. CONTRAST:  154m OMNIPAQUE IOHEXOL 350 MG/ML SOLN COMPARISON:  Abdominal radiograph dated 03/01/2021 FINDINGS: Lower chest: Bibasilar linear atelectasis/scarring. The visualized lung bases are otherwise clear. No intra-abdominal free air.  Small free fluid in the pelvis. Hepatobiliary: Fatty liver. No intrahepatic biliary dilatation. The gallbladder is unremarkable. Pancreas: Unremarkable. No pancreatic ductal dilatation or surrounding inflammatory changes. Spleen: Normal in size without focal abnormality. Adrenals/Urinary Tract: Indeterminate bilateral adrenal nodules measuring 15 mm on the left. There is no hydronephrosis on either side. There is symmetric enhancement and excretion of contrast by both kidneys. The visualized ureters appear unremarkable. The urinary bladder is decompressed around a Foley catheter. Stomach/Bowel: Small hiatal hernia. Loose stool noted throughout the colon compatible with diarrheal state. Correlation with clinical exam and stool cultures recommended. No evidence of bowel obstruction. Mild hazy appearance of the colonic wall may be reactive or represent mild colitis. The appendix is not visualized with certainty. No inflammatory changes identified in the right lower quadrant. Vascular/Lymphatic: Advanced aortoiliac atherosclerotic disease. The IVC is unremarkable. No portal venous gas. There is no adenopathy. Reproductive: Hysterectomy.  No adnexal masses. Other: Small fat containing umbilical hernia. Musculoskeletal: Multilevel degenerative changes of the spine and lumbar fusion. No acute osseous pathology. IMPRESSION: 1. Diarrheal state with possible mild colitis. Correlation with clinical exam and stool cultures recommended. No bowel obstruction. 2. Fatty liver. 3. Indeterminate bilateral adrenal nodules measuring 15 mm on the left. 4. Aortic Atherosclerosis (ICD10-I70.0). Electronically Signed   By: AAnner CreteM.D.   On: 03/02/2021 00:57         Scheduled Meds:  ascorbic acid  1,000 mg Oral Daily   aspirin EC  81 mg Oral Daily   calcium-vitamin D  2 tablet Oral Once per day on Mon Wed Fri   Chlorhexidine Gluconate Cloth  6 each Topical Daily   ciprofloxacin  500 mg Oral BID   docusate sodium  500 mg Oral QHS   loratadine  10 mg Oral Daily   multivitamin  1 tablet Oral Daily   pantoprazole  40 mg Oral Daily   rosuvastatin  10 mg Oral Daily   sodium chloride flush  3 mL Intravenous Q12H   Continuous Infusions:  sodium chloride 75 mL/hr at 03/02/21 0017   sodium chloride 75 mL/hr at 03/03/21 1330     LOS: 4 days     RCordelia Poche MD [facility] 03/03/2021, 1:54 PM  If 7PM-7AM, please contact night-coverage [email or web address]

## 2021-03-04 ENCOUNTER — Inpatient Hospital Stay (HOSPITAL_COMMUNITY): Payer: Medicare Other

## 2021-03-04 LAB — URINE CULTURE: Culture: NO GROWTH

## 2021-03-04 MED ORDER — POLYETHYLENE GLYCOL 3350 17 G PO PACK
17.0000 g | PACK | Freq: Two times a day (BID) | ORAL | Status: DC
  Administered 2021-03-04 – 2021-03-08 (×5): 17 g via ORAL
  Filled 2021-03-04 (×6): qty 1

## 2021-03-04 MED ORDER — CIPROFLOXACIN HCL 500 MG PO TABS: 500.0000 mg | ORAL_TABLET | Freq: Two times a day (BID) | ORAL | 0 refills | Status: DC

## 2021-03-04 MED ORDER — BISACODYL 10 MG RE SUPP
10.0000 mg | Freq: Every day | RECTAL | Status: DC | PRN
  Administered 2021-03-04: 10 mg via RECTAL
  Filled 2021-03-04 (×3): qty 1

## 2021-03-04 NOTE — Progress Notes (Signed)
PROGRESS NOTE    Janice Pace  [id number] DOB: 1946/01/20 DOA: 02/27/2021 PCP: Leeroy Cha, MD  Primary Service: Neurosurgery  Brief Narrative: Janice Pace is a 75 y.o. female with a history of GERD, hyperlipidemia, degenerative disc disease and foraminal stenosis of multiple lumbar spine levels. Patient was admitted for spine surgery and medicine was consulted for sinus tachycardia, possible UTI. General surgery consulted for ileus.   Assessment & Plan:   Active Problems:   Hyperlipidemia   Spinal stenosis of lumbar region   Acute lower UTI   Colitis   Ileus (HCC)   Dehydration   GERD (gastroesophageal reflux disease)   Sinus tachycardia Confirmed sinus rhythm on EKG. In setting of possible colitis in addition to ileus. Patient's urine suggest possible dehydration as well. Started on NS IV fluids with improvement. Resolved. -Telemetry not needed and can be discontinued at any time from a sinus tachycardia standpoint  Dehydration IV fluids as mentioned above. Urinalysis very concentrated. Improved with IV fluids.  Possible UTI Urinalysis suggests possible infection. Patient with suprapubic tenderness. Already started on Ciprofloxacin. Urine culture (obtained after antibiotics) with no growth. Recommend to discontinue Ciprofloxacin.  Ileus Possible mild colitis General surgery consulted and are managing. Patient is having bowel movements today. C. Difficile testing sent ordered by primary which was negative.  Bilirubinuria Unsure of etiology. Mild bilirubinemia. Can recheck as an outpatient.  Hyperlipidemia -Continue Crestor  GERD -Continue Protonix  Chronic diastolic heart dysfunction No heart failure symptoms/signs. Stable.  Spinal stenosis s/p spinal fixation Per primary   DVT prophylaxis: Per primary Code Status:   Code Status: Full Code Family Communication: Husband at bedside Disposition Plan: Per primary   Consultants:  General  surgery General medicine  Procedures:  ANTERIOR LATERAL INTERBODY FUSION L2-3, L3-4 (02/27/2021)  Antimicrobials: Ciprofloxacin    Subjective: Still no bowel movements. Not passing gas. No abdominal pain. Had some nausea with emesis this morning but states it was secondary to her broth/opiates.  Objective: Vitals:   03/04/21 0311 03/04/21 0318 03/04/21 0700 03/04/21 1100  BP: (!) 149/81  (!) 145/75 (!) 149/54  Pulse: 98   95  Resp: 13   20  Temp: (!) 97.5 F (36.4 C)  98.6 F (37 C) 98.2 F (36.8 C)  TempSrc: Oral  Oral Oral  SpO2: 96%   94%  Weight:  100.4 kg    Height:        Intake/Output Summary (Last 24 hours) at 03/04/2021 1337 Last data filed at 03/04/2021 0925 Gross per 24 hour  Intake 3935.14 ml  Output --  Net 3935.14 ml    Filed Weights   02/27/21 0709 03/04/21 0318  Weight: 87.5 kg 100.4 kg    Examination:  General exam: Appears calm and comfortable Respiratory system: Clear to auscultation. Respiratory effort normal. Cardiovascular system: S1 & S2 heard, RRR. No murmurs, rubs, gallops or clicks. Gastrointestinal system: Abdomen is distended, soft and nontender. No organomegaly or masses felt. Decreased bowel sounds heard. Central nervous system: Alert and oriented. No focal neurological deficits. Musculoskeletal: No edema. No calf tenderness Skin: No cyanosis. No rashes Psychiatry: Judgement and insight appear normal. Mood & affect appropriate.    Data Reviewed: I have personally reviewed following labs and imaging studies  CBC Lab Results  Component Value Date   WBC 11.7 (H) 03/01/2021   RBC 3.87 03/01/2021   HGB 12.5 03/01/2021   HCT 36.2 03/01/2021   MCV 93.5 03/01/2021   MCH 32.3 03/01/2021   PLT 157  03/01/2021   MCHC 34.5 03/01/2021   RDW 13.0 03/01/2021   LYMPHSABS 0.6 (L) 03/01/2021   MONOABS 1.4 (H) 03/01/2021   EOSABS 0.0 03/01/2021   BASOSABS 0.0 0000000     Last metabolic panel Lab Results  Component Value Date   NA  135 03/03/2021   K 4.0 03/03/2021   CL 107 03/03/2021   CO2 22 03/03/2021   BUN 14 03/03/2021   CREATININE 0.70 03/03/2021   GLUCOSE 152 (H) 03/03/2021   GFRNONAA >60 03/03/2021   GFRAA 100 04/24/2020   CALCIUM 7.9 (L) 03/03/2021   PROT 5.1 (L) 03/02/2021   ALBUMIN 2.5 (L) 03/02/2021   LABGLOB 2.1 04/24/2020   AGRATIO 2.1 04/24/2020   BILITOT 1.3 (H) 03/02/2021   ALKPHOS 47 03/02/2021   AST 33 03/02/2021   ALT 22 03/02/2021   ANIONGAP 6 03/03/2021    CBG (last 3)  No results for input(s): GLUCAP in the last 72 hours.   GFR: Estimated Creatinine Clearance: 68.4 mL/min (by C-G formula based on SCr of 0.7 mg/dL).  Coagulation Profile: No results for input(s): INR, PROTIME in the last 168 hours.  Recent Results (from the past 240 hour(s))  SARS Coronavirus 2 (TAT 6-24 hrs)     Status: None   Collection Time: 02/25/21 12:00 AM  Result Value Ref Range Status   SARS Coronavirus 2 RESULT: NEGATIVE  Final    Comment: RESULT: NEGATIVESARS-CoV-2 INTERPRETATION:A NEGATIVE  test result means that SARS-CoV-2 RNA was not present in the specimen above the limit of detection of this test. This does not preclude a possible SARS-CoV-2 infection and should not be used as the  sole basis for patient management decisions. Negative results must be combined with clinical observations, patient history, and epidemiological information. Optimum specimen types and timing for peak viral levels during infections caused by SARS-CoV-2  have not been determined. Collection of multiple specimens or types of specimens may be necessary to detect virus. Improper specimen collection and handling, sequence variability under primers/probes, or organism present below the limit of detection may  lead to false negative results. Positive and negative predictive values of testing are highly dependent on prevalence. False negative test results are more likely when prevalence of disease is high.The expected result is  NEGATIVE.Fact S heet for  Healthcare Providers: LocalChronicle.no Sheet for Patients: SalonLookup.es Reference Range - Negative   Urine Culture     Status: None (Preliminary result)   Collection Time: 03/02/21  8:49 AM   Specimen: Urine, Clean Catch  Result Value Ref Range Status   Specimen Description URINE, CLEAN CATCH  Final   Special Requests NONE  Final   Culture   Final    NO GROWTH Performed at [facility], [address], [place], [place] [address]    Report Status PENDING  Incomplete  C Difficile Quick Screen (NO PCR Reflex)     Status: None   Collection Time: 03/02/21  7:07 PM   Specimen: STOOL  Result Value Ref Range Status   C Diff antigen NEGATIVE NEGATIVE Final   C Diff toxin NEGATIVE NEGATIVE Final   C Diff interpretation No C. difficile detected.  Final    Comment: Performed at [facility], [place] [address] [place], [place] [address]        Radiology Studies: No results found.      Scheduled Meds:  ascorbic acid  1,000 mg Oral Daily   aspirin EC  81 mg Oral Daily   calcium-vitamin D  2 tablet Oral Once per day on Mon Wed Fri   Chlorhexidine Gluconate Cloth  6 each Topical Daily   ciprofloxacin  500 mg Oral BID   docusate sodium  500 mg Oral QHS   loratadine  10 mg Oral Daily   multivitamin  1 tablet Oral Daily   pantoprazole  40 mg Oral Daily   polyethylene glycol  17 g Oral BID   rosuvastatin  10 mg Oral Daily   sodium chloride flush  3 mL Intravenous Q12H   Continuous Infusions:  sodium chloride 75 mL/hr at 03/02/21 0017   sodium chloride 75 mL/hr at 03/04/21 0330     LOS: 5 days     Cordelia Poche, MD [facility] 03/04/2021, 1:37 PM  If 7PM-7AM, please contact night-coverage [email or web address]

## 2021-03-04 NOTE — Progress Notes (Signed)
IV removed, D/C instructions reviewed with patient.  Pt to D/C home with husband, will transport via wheelchair.

## 2021-03-04 NOTE — Progress Notes (Signed)
Physical Therapy Treatment Patient Details Name: Janice Pace MRN: [id number] DOB: Aug 04, 1945 Today's Date: 03/04/2021    History of Present Illness Pt is a 75 y/o female presenting with radicular lumbar symptoms s/p elective L2-L4 ALIF on 02/27/2021. Post-surgical complication of CT scan of abdomen pelvis was consistent with ileus. PMHx significant for several back surgeries including previous L4-S1 fusion in 2013, CA, heart murmur and valve disorder, pre-diabetes.    PT Comments    Pt sitting up in chair with no spinal brace on upon arrival; reminded pt of spinal precautions and donning brace for out of bed mobility. Pt maintaining level of functional mobility. Ambulating 100 ft with a walker and short standing rest breaks. Presents with decreased functional mobility secondary to pain, decreased endurance, and balance deficits. D/c plan remains appropriate.    Follow Up Recommendations  Home health PT;Supervision for mobility/OOB     Equipment Recommendations  Rolling walker with 5" wheels;3in1 (PT)    Recommendations for Other Services       Precautions / Restrictions Precautions Precautions: Back;Fall Precaution Booklet Issued: Yes (comment) Precaution Comments: Reviewed handout and pt was cued for precautions during functional mobility. Required Braces or Orthoses: Spinal Brace Spinal Brace: Lumbar corset;Applied in sitting position Restrictions Weight Bearing Restrictions: No    Mobility  Bed Mobility               General bed mobility comments: OOB in chair    Transfers Overall transfer level: Needs assistance Equipment used: Rolling walker (2 wheeled) Transfers: Sit to/from Stand Sit to Stand: Min guard         General transfer comment: Increased time to rise  Ambulation/Gait Ambulation/Gait assistance: Min guard Gait Distance (Feet): 100 Feet Assistive device: Rolling walker (2 wheeled) Gait Pattern/deviations: Step-through pattern;Decreased  stride length Gait velocity: Decreased   General Gait Details: Improved upright posture, slow and steady pace. Pt requiring 2 standing rest breaks. Cues provided for activity pacing   Stairs             Wheelchair Mobility    Modified Rankin (Stroke Patients Only)       Balance Overall balance assessment: Needs assistance Sitting-balance support: No upper extremity supported;Feet supported Sitting balance-Leahy Scale: Fair     Standing balance support: Single extremity supported;No upper extremity supported;Bilateral upper extremity supported Standing balance-Leahy Scale: Fair Standing balance comment: Able to stand statically without UE support briefly, but UE support for mobility.                            Cognition Arousal/Alertness: Awake/alert Behavior During Therapy: WFL for tasks assessed/performed Overall Cognitive Status: Impaired/Different from baseline Area of Impairment: Memory                     Memory: Decreased short-term memory;Decreased recall of precautions         General Comments: Pt with difficulty recalling spinal precautions; sitting up in chair with no brace on upon arrival      Exercises      General Comments        Pertinent Vitals/Pain Pain Assessment: Faces Faces Pain Scale: Hurts little more Pain Location: Low back (incisional) Pain Descriptors / Indicators: Sore;Grimacing;Operative site guarding Pain Intervention(s): Monitored during session;Premedicated before session    Home Living                      Prior Function  PT Goals (current goals can now be found in the care plan section) Acute Rehab PT Goals Patient Stated Goal: to improve and go home PT Goal Formulation: With patient/family Time For Goal Achievement: 03/07/21 Potential to Achieve Goals: Good Progress towards PT goals: Progressing toward goals    Frequency    Min 5X/week      PT Plan Current plan  remains appropriate    Co-evaluation              AM-PAC PT "6 Clicks" Mobility   Outcome Measure  Help needed turning from your back to your side while in a flat bed without using bedrails?: A Little Help needed moving from lying on your back to sitting on the side of a flat bed without using bedrails?: A Little Help needed moving to and from a bed to a chair (including a wheelchair)?: A Little Help needed standing up from a chair using your arms (e.g., wheelchair or bedside chair)?: A Little Help needed to walk in hospital room?: A Little Help needed climbing 3-5 steps with a railing? : A Little 6 Click Score: 18    End of Session Equipment Utilized During Treatment: Back brace;Gait belt Activity Tolerance: Patient tolerated treatment well Patient left: with call bell/phone within reach;with family/visitor present;in chair;with chair alarm set Nurse Communication: Mobility status PT Visit Diagnosis: Unsteadiness on feet (R26.81);Pain;Other abnormalities of gait and mobility (R26.89);Difficulty in walking, not elsewhere classified (R26.2) Pain - part of body:  (back)     Time: [id number] PT Time Calculation (min) (ACUTE ONLY): 15 min  Charges:  $Therapeutic Activity: 8-22 mins                     Wyona Almas, PT, DPT Acute Rehabilitation Services Pager [phone] Office [phone]    Deno Etienne 03/04/2021, 11:28 AM

## 2021-03-04 NOTE — Discharge Summary (Addendum)
Physician Discharge Summary  Patient ID: Janice Pace MRN: [id number] DOB/AGE: 1946-06-17 75 y.o. Estimated body mass index is 40.48 kg/m as calculated from the following:   Height as of this encounter: '5\' 2"'$  (1.575 m).   Weight as of this encounter: 100.4 kg.   Admit date: 02/27/2021 Discharge date: 03/09/2021  Admission Diagnoses: Degenerative disease lumbar spinal stenosis L1-L4  Discharge Diagnoses: Same Active Problems:   Hyperlipidemia   Spinal stenosis of lumbar region   Acute lower UTI   Colitis   Ileus (HCC)   Dehydration   GERD (gastroesophageal reflux disease)   Discharged Condition: good  Hospital Course: Patient was admitted to the hospital underwent anterior lateral interbody fusions at L2-3 and L3-4 and posterior spinal fixation from L1 L4 postop patient did very well initially but on postop day 2 developed abdominal distention with hypoactive bowel sounds.  Imaging and lab work was consistent with ileus however we consulted both general surgery and [facility] for evaluation of tachycardia with abdominal distention and ileus patient did undergo further work-up with abdominal CT that was unremarkable patient was diagnosed with a UTI felt to be mildly dehydrated and once these were treated patient got significantly better was mobilizing with physical and occupational therapy and stable for discharge home on postop day 10  Consults: Significant Diagnostic Studies: Treatments: Anterior lateral interbody fusions L2-3 L3-4 posterior spinal fixation L1 L4 Discharge Exam: Blood pressure (!) 149/54, pulse 95, temperature 98.2 F (36.8 C), temperature source Oral, resp. rate 20, height '5\' 2"'$  (1.575 m), weight 100.4 kg, SpO2 94 %. Strength 5 out of 5 wound clean dry and intact  Disposition: Home  Discharge Instructions     Face-to-face encounter (required for Medicare/Medicaid patients)   Complete by: As directed    I Elaina Hoops certify that this patient is  under my care and that I, or a nurse practitioner or physician's assistant working with me, had a face-to-face encounter that meets the physician face-to-face encounter requirements with this patient on 03/04/2021. The encounter with the patient was in whole, or in part for the following medical condition(s) which is the primary reason for home health care (List medical condition): Degenerative disc disease lumbar spinal stenosis with radiculopathy   The encounter with the patient was in whole, or in part, for the following medical condition, which is the primary reason for home health care: Degenerative's disease lumbar spinal stenosis with radiculopathy   I certify that, based on my findings, the following services are medically necessary home health services: Physical therapy   Reason for Medically Necessary Home Health Services: Therapy- Instruction on Safe use of Assistive Devices for ADLs   My clinical findings support the need for the above services: Pain interferes with ambulation/mobility   Further, I certify that my clinical findings support that this patient is homebound due to: Pain interferes with ambulation/mobility   Home Health   Complete by: As directed    To provide the following care/treatments: PT      Allergies as of 03/04/2021       Reactions   Sulfa Antibiotics Hives, Itching   Atorvastatin Other (See Comments)   mucsle aches   Metformin Hcl Other (See Comments)   joint pains and muscle aches        Medication List     TAKE these medications    acetaminophen 500 MG tablet Commonly known as: TYLENOL Take 1,000 mg by mouth every 6 (six) hours as needed for moderate pain or  headache.   albuterol 108 (90 Base) MCG/ACT inhaler Commonly known as: VENTOLIN HFA Inhale 1-2 puffs into the lungs every 6 (six) hours as needed for wheezing or shortness of breath.   ascorbic acid 1000 MG tablet Commonly known as: VITAMIN C Take 1,000 mg by mouth daily.   aspirin EC 81  MG tablet Take 81 mg by mouth daily. Swallow whole.   BLACK CHERRY CONCENTRATE PO Take 2 capsules by mouth daily.   CALCIUM 600 + D PO Take 2 tablets by mouth 3 (three) times a week.   ciprofloxacin 500 MG tablet Commonly known as: CIPRO Take 1 tablet (500 mg total) by mouth 2 (two) times daily.   cyclobenzaprine 5 MG tablet Commonly known as: FLEXERIL Take 5 mg by mouth See admin instructions. Take 5 mg at bedtime, may take a second 5 mg dose in the middle of the night as needed for spasms   docusate sodium 100 MG capsule Commonly known as: COLACE Take 500 mg by mouth at bedtime.   fexofenadine 180 MG tablet Commonly known as: ALLEGRA Take 180 mg by mouth daily as needed for allergies or rhinitis.   gabapentin 300 MG capsule Commonly known as: NEURONTIN Take 1-2 capsules (300-600 mg total) by mouth 3 (three) times daily as needed (Nerve pain).   Hair/Skin/Nails Caps Take 2 capsules by mouth daily.   HYDROcodone-acetaminophen 5-325 MG tablet Commonly known as: NORCO/VICODIN Take 1 tablet by mouth every 6 (six) hours as needed. What changed:  when to take this reasons to take this   ibuprofen 200 MG tablet Commonly known as: ADVIL Take 400 mg by mouth every 6 (six) hours as needed for headache or moderate pain.   omeprazole 20 MG capsule Commonly known as: PRILOSEC Take 20 mg by mouth daily before breakfast.   rosuvastatin 10 MG tablet Commonly known as: Crestor Take 1 tablet (10 mg total) by mouth daily.               Durable Medical Equipment  (From admission, onward)           Start     Ordered   02/28/21 1508  For home use only DME Walker rolling  Once       Question Answer Comment  Walker: With 5 Inch Wheels   Patient needs a walker to treat with the following condition Status post spinal surgery      02/28/21 1508   02/28/21 1508  For home use only DME 3 n 1  Once        02/28/21 1508            Follow-up Information     Health,  [place] Follow up.   Specialty: Home Health Services Why: Your home health has been set up with [facility]. The agency will contact you with start of service information. If you have any questions please call number above. Contact information: [address] [place] [address] [phone]                 Signed: Elaina Hoops 03/04/2021, 4:09 PM

## 2021-03-04 NOTE — Progress Notes (Signed)
After further discussion with Dr. Saintclair Halsted, it was determined that pt is not ready for D/C at this time.  Will keep pt overnight and reassess in am.

## 2021-03-04 NOTE — Care Management Important Message (Signed)
Important Message  Patient Details  Name: Janice Pace MRN: [id number] Date of Birth: 22-May-1946   Medicare Important Message Given:  Yes     Orbie Pyo 03/04/2021, 3:37 PM

## 2021-03-04 NOTE — Progress Notes (Signed)
5 Days Post-Op   Subjective/Chief Complaint: Pt without N/V  Not much flatus  Walking halls     Objective: Vital signs in last 24 hours: Temp:  [97.5 F (36.4 C)-98.6 F (37 C)] 98.6 F (37 C) (08/22 0700) Pulse Rate:  [93-108] 98 (08/22 0311) Resp:  [13-21] 13 (08/22 0311) BP: (135-149)/(63-81) 145/75 (08/22 0700) SpO2:  [95 %-98 %] 96 % (08/22 0311) Weight:  [100.4 kg] 100.4 kg (08/22 0318) Last BM Date: 03/03/21  Intake/Output from previous day: 08/21 0701 - 08/22 0700 In: 4072.5 [P.O.:1400; I.V.:2672.5] Out: -  Intake/Output this shift: No intake/output data recorded.  General appearance: alert and cooperative GI: DISTENDED BUT SOFT NT  Skin: Skin color, texture, turgor normal. No rashes or lesions or normal  Lab Results:  No results for input(s): WBC, HGB, HCT, PLT in the last 72 hours. BMET Recent Labs    03/02/21 0821 03/03/21 0356  NA 138 135  K 3.9 4.0  CL 110 107  CO2 20* 22  GLUCOSE 176* 152*  BUN 21 14  CREATININE 0.96 0.70  CALCIUM 8.2* 7.9*   PT/INR No results for input(s): LABPROT, INR in the last 72 hours. ABG No results for input(s): PHART, HCO3 in the last 72 hours.  Invalid input(s): PCO2, PO2  Studies/Results: No results found.  Anti-infectives: Anti-infectives (From admission, onward)    Start     Dose/Rate Route Frequency Ordered Stop   03/01/21 2245  ciprofloxacin (CIPRO) tablet 500 mg        500 mg Oral 2 times daily 03/01/21 2159     02/27/21 2000  ceFAZolin (ANCEF) IVPB 2g/100 mL premix        2 g 200 mL/hr over 30 Minutes Intravenous Every 8 hours 02/27/21 1602 02/28/21 0356   02/27/21 0700  ceFAZolin (ANCEF) IVPB 2g/100 mL premix        2 g 200 mL/hr over 30 Minutes Intravenous On call to O.R. 02/27/21 0645 02/27/21 1240       Assessment/Plan: Post op ileus - advance diet to full, increase miralax   Continue to ambulate    LOS: 5 days    Turner Daniels MD  03/04/2021

## 2021-03-05 ENCOUNTER — Encounter (HOSPITAL_COMMUNITY): Payer: Self-pay | Admitting: Neurosurgery

## 2021-03-05 LAB — COMPREHENSIVE METABOLIC PANEL
ALT: 29 U/L (ref 0–44)
AST: 24 U/L (ref 15–41)
Albumin: 2.4 g/dL — ABNORMAL LOW (ref 3.5–5.0)
Alkaline Phosphatase: 49 U/L (ref 38–126)
Anion gap: 5 (ref 5–15)
BUN: 7 mg/dL — ABNORMAL LOW (ref 8–23)
CO2: 25 mmol/L (ref 22–32)
Calcium: 8.2 mg/dL — ABNORMAL LOW (ref 8.9–10.3)
Chloride: 107 mmol/L (ref 98–111)
Creatinine, Ser: 0.55 mg/dL (ref 0.44–1.00)
GFR, Estimated: >60 mL/min (ref >60)
Glucose, Bld: 132 mg/dL — ABNORMAL HIGH (ref 70–99)
Potassium: 3.3 mmol/L — ABNORMAL LOW (ref 3.5–5.1)
Sodium: 137 mmol/L (ref 135–145)
Total Bilirubin: 0.6 mg/dL (ref 0.3–1.2)
Total Protein: 4.9 g/dL — ABNORMAL LOW (ref 6.5–8.1)

## 2021-03-05 LAB — MAGNESIUM: Magnesium: 1.9 mg/dL (ref 1.7–2.4)

## 2021-03-05 MED ORDER — MAGNESIUM SULFATE 2 GM/50ML IV SOLN
2.0000 g | Freq: Once | INTRAVENOUS | Status: AC
  Administered 2021-03-05: 2 g via INTRAVENOUS
  Filled 2021-03-05: qty 50

## 2021-03-05 MED ORDER — POTASSIUM CHLORIDE CRYS ER 20 MEQ PO TBCR
40.0000 meq | EXTENDED_RELEASE_TABLET | Freq: Two times a day (BID) | ORAL | Status: AC
  Administered 2021-03-05 (×2): 40 meq via ORAL
  Filled 2021-03-05 (×2): qty 2

## 2021-03-05 MED ORDER — BISACODYL 10 MG RE SUPP
10.0000 mg | Freq: Once | RECTAL | Status: AC
  Administered 2021-03-05: 10 mg via RECTAL

## 2021-03-05 NOTE — Progress Notes (Signed)
Occupational Therapy Treatment Patient Details Name: Janice Pace MRN: [id number] DOB: 03/11/1946 Today's Date: 03/05/2021    History of present illness Pt is a 75 y/o female presenting with radicular lumbar symptoms s/p elective L2-L4 ALIF on 02/27/2021. Post-surgical complication of CT scan of abdomen pelvis was consistent with ileus. PMHx significant for several back surgeries including previous L4-S1 fusion in 2013, CA, heart murmur and valve disorder, pre-diabetes.   OT comments  Treatment session with focus on self-care re-education, functional mobility, safety with ADLs and recall/adherence to spinal precautions. Patient only able to recall 2/3 back precautions. Re-education provided. Patient completed 2/3 grooming tasks standing at sink level with supervision A. Further patient/family education provided on safety with ADLs upon return home. OT will continue to follow acutely.    Follow Up Recommendations  Home health OT;Supervision/Assistance - 24 hour    Equipment Recommendations  3 in 1 bedside commode;Other (comment) (rolling walker)    Recommendations for Other Services      Precautions / Restrictions Precautions Precautions: Back;Fall Precaution Booklet Issued: Yes (comment) Precaution Comments: Reviewed handout and pt was cued for precautions during functional mobility. Required Braces or Orthoses: Spinal Brace Spinal Brace: Lumbar corset;Applied in sitting position Restrictions Weight Bearing Restrictions: No       Mobility Bed Mobility Overal bed mobility: Needs Assistance Bed Mobility: Rolling;Sidelying to Sit Rolling: Supervision Sidelying to sit: Supervision       General bed mobility comments: Supervision A; cues for log rolling technique    Transfers Overall transfer level: Needs assistance Equipment used: Rolling walker (2 wheeled) Transfers: Sit to/from Stand Sit to Stand: Min guard         General transfer comment: Increased time to  rise    Balance Overall balance assessment: Needs assistance Sitting-balance support: No upper extremity supported;Feet supported Sitting balance-Leahy Scale: Fair     Standing balance support: Single extremity supported;No upper extremity supported;Bilateral upper extremity supported Standing balance-Leahy Scale: Fair Standing balance comment: Able to stand statically without UE support briefly, but UE support for mobility.                           ADL either performed or assessed with clinical judgement   ADL Overall ADL's : Needs assistance/impaired     Grooming: Supervision/safety;Standing Grooming Details (indicate cue type and reason): Standing at sink level                                     Vision       Perception     Praxis      Cognition Arousal/Alertness: Awake/alert Behavior During Therapy: WFL for tasks assessed/performed Overall Cognitive Status: Impaired/Different from baseline Area of Impairment: Memory                     Memory: Decreased short-term memory;Decreased recall of precautions         General Comments: Able to recall only 2/3 spinal precautions; cues for adherence during ADLs.        Exercises     Shoulder Instructions       General Comments Husband present at bedside throughout treatment session.    Pertinent Vitals/ Pain       Pain Assessment: 0-10 Pain Score: 6  Pain Location: Low back (incisional) Pain Descriptors / Indicators: Sore;Grimacing;Operative site guarding Pain Intervention(s): Limited activity within patient's tolerance;Monitored  during session;Repositioned  Home Living                                          Prior Functioning/Environment              Frequency  Min 2X/week        Progress Toward Goals  OT Goals(current goals can now be found in the care plan section)  Progress towards OT goals: Progressing toward goals  Acute Rehab OT  Goals Patient Stated Goal: to improve and go home OT Goal Formulation: With patient/family Time For Goal Achievement: 03/14/21 Potential to Achieve Goals: Good ADL Goals Pt Will Perform Grooming: with modified independence;standing Pt Will Perform Upper Body Dressing: with modified independence;sitting Pt Will Perform Lower Body Dressing: with modified independence;sit to/from stand;with adaptive equipment Pt Will Transfer to Toilet: with modified independence;ambulating Pt Will Perform Toileting - Clothing Manipulation and hygiene: with modified independence;sit to/from stand Pt Will Perform Tub/Shower Transfer: 3 in 1;rolling walker Additional ADL Goal #1: Patient will recall 3/3 spinal precautions without cues, demonstrating good return demo during ADLs.  Plan Discharge plan remains appropriate;Frequency remains appropriate    Co-evaluation                 AM-PAC OT "6 Clicks" Daily Activity     Outcome Measure   Help from another person eating meals?: None Help from another person taking care of personal grooming?: A Little Help from another person toileting, which includes using toliet, bedpan, or urinal?: A Lot Help from another person bathing (including washing, rinsing, drying)?: A Little Help from another person to put on and taking off regular upper body clothing?: A Little Help from another person to put on and taking off regular lower body clothing?: A Lot 6 Click Score: 17    End of Session Equipment Utilized During Treatment: Back brace;Rolling walker  OT Visit Diagnosis: Unsteadiness on feet (R26.81);Muscle weakness (generalized) (M62.81);Pain Pain - part of body:  (low back)   Activity Tolerance Patient tolerated treatment well   Patient Left in chair;with call bell/phone within reach;with family/visitor present   Nurse Communication Mobility status        Time: [id number] OT Time Calculation (min): 23 min  Charges: OT General Charges $OT Visit: 1  Visit OT Treatments $Self Care/Home Management : 8-22 mins $Therapeutic Activity: 8-22 mins  Benz Vandenberghe H. OTR/L Supplemental OT, Department of rehab services [phone]   Sidharth Leverette R H. 03/05/2021, 9:20 AM

## 2021-03-05 NOTE — Progress Notes (Signed)
PROGRESS NOTE    Janice Pace  [id number] DOB: 1945/12/18 DOA: 02/27/2021 PCP: Leeroy Cha, MD  Primary Service: Neurosurgery  Brief Narrative: Janice Pace is a 75 y.o. female with a history of GERD, hyperlipidemia, degenerative disc disease and foraminal stenosis of multiple lumbar spine levels. Patient was admitted for spine surgery and medicine was consulted for sinus tachycardia, possible UTI. General surgery consulted for ileus.   Assessment & Plan:   Active Problems:   Hyperlipidemia   Spinal stenosis of lumbar region   Acute lower UTI   Colitis   Ileus (HCC)   Dehydration   GERD (gastroesophageal reflux disease)   Sinus tachycardia Confirmed sinus rhythm on EKG. In setting of possible colitis in addition to ileus. Patient's urine suggest possible dehydration as well. Started on NS IV fluids with improvement. Resolved. Telemetry discontinued.  Dehydration IV fluids as mentioned above. Urinalysis very concentrated. Improved with IV fluids. Resolved  Possible UTI Urinalysis suggests possible infection. Patient with suprapubic tenderness. Already started on Ciprofloxacin. Urine culture (obtained after antibiotics) with no growth. Recommend to discontinue Ciprofloxacin.  Ileus Possible mild colitis General surgery consulted and are managing. Patient is having bowel movements today. C. Difficile testing sent ordered by primary which was negative. Recent abdominal x-ray with evidence for ileus vs obstruction.   Bilirubinuria Unsure of etiology. Mild bilirubinemia. Can recheck as an outpatient.  Hyperlipidemia -Continue Crestor  GERD -Continue Protonix  Chronic diastolic heart dysfunction No heart failure symptoms/signs. Stable.  Spinal stenosis s/p spinal fixation Per primary  TRH will sign off. Discussed with primary on 8/22. Only recommendation is she can be discontinued from Ciprofloxacin from a UTI standpoint.   DVT prophylaxis: Per  primary Code Status:   Code Status: Full Code Family Communication: None today. Husband asleep at bedside Disposition Plan: Per primary   Consultants:  General surgery General medicine  Procedures:  ANTERIOR LATERAL INTERBODY FUSION L2-3, L3-4 (02/27/2021)  Antimicrobials: Ciprofloxacin    Subjective: Small bowel movement overnight.  Objective: Vitals:   03/04/21 1919 03/04/21 2344 03/05/21 0308 03/05/21 0740  BP: (!) 165/81 (!) 161/77 (!) 170/81   Pulse: 100 98 100   Resp: '20 20 20   '$ Temp: 98.4 F (36.9 C) 98.1 F (36.7 C) 97.8 F (36.6 C) 98.3 F (36.8 C)  TempSrc: Oral Oral Oral Oral  SpO2: 97% 97% 96% 98%  Weight:      Height:        Intake/Output Summary (Last 24 hours) at 03/05/2021 1047 Last data filed at 03/04/2021 1500 Gross per 24 hour  Intake 120 ml  Output --  Net 120 ml    Filed Weights   02/27/21 0709 03/04/21 0318  Weight: 87.5 kg 100.4 kg    Examination:  General: Well appearing, no distress   Data Reviewed: I have personally reviewed following labs and imaging studies  CBC Lab Results  Component Value Date   WBC 11.7 (H) 03/01/2021   RBC 3.87 03/01/2021   HGB 12.5 03/01/2021   HCT 36.2 03/01/2021   MCV 93.5 03/01/2021   MCH 32.3 03/01/2021   PLT 157 03/01/2021   MCHC 34.5 03/01/2021   RDW 13.0 03/01/2021   LYMPHSABS 0.6 (L) 03/01/2021   MONOABS 1.4 (H) 03/01/2021   EOSABS 0.0 03/01/2021   BASOSABS 0.0 0000000     Last metabolic panel Lab Results  Component Value Date   NA 137 03/05/2021   K 3.3 (L) 03/05/2021   CL 107 03/05/2021   CO2 25 03/05/2021  BUN 7 (L) 03/05/2021   CREATININE 0.55 03/05/2021   GLUCOSE 132 (H) 03/05/2021   GFRNONAA >60 03/05/2021   GFRAA 100 04/24/2020   CALCIUM 8.2 (L) 03/05/2021   PROT 4.9 (L) 03/05/2021   ALBUMIN 2.4 (L) 03/05/2021   LABGLOB 2.1 04/24/2020   AGRATIO 2.1 04/24/2020   BILITOT 0.6 03/05/2021   ALKPHOS 49 03/05/2021   AST 24 03/05/2021   ALT 29 03/05/2021    ANIONGAP 5 03/05/2021    CBG (last 3)  No results for input(s): GLUCAP in the last 72 hours.   GFR: Estimated Creatinine Clearance: 68.4 mL/min (by C-G formula based on SCr of 0.55 mg/dL).  Coagulation Profile: No results for input(s): INR, PROTIME in the last 168 hours.  Recent Results (from the past 240 hour(s))  SARS Coronavirus 2 (TAT 6-24 hrs)     Status: None   Collection Time: 02/25/21 12:00 AM  Result Value Ref Range Status   SARS Coronavirus 2 RESULT: NEGATIVE  Final    Comment: RESULT: NEGATIVESARS-CoV-2 INTERPRETATION:A NEGATIVE  test result means that SARS-CoV-2 RNA was not present in the specimen above the limit of detection of this test. This does not preclude a possible SARS-CoV-2 infection and should not be used as the  sole basis for patient management decisions. Negative results must be combined with clinical observations, patient history, and epidemiological information. Optimum specimen types and timing for peak viral levels during infections caused by SARS-CoV-2  have not been determined. Collection of multiple specimens or types of specimens may be necessary to detect virus. Improper specimen collection and handling, sequence variability under primers/probes, or organism present below the limit of detection may  lead to false negative results. Positive and negative predictive values of testing are highly dependent on prevalence. False negative test results are more likely when prevalence of disease is high.The expected result is NEGATIVE.Fact S heet for  Healthcare Providers: LocalChronicle.no Sheet for Patients: SalonLookup.es Reference Range - Negative   Urine Culture     Status: None   Collection Time: 03/02/21  8:49 AM   Specimen: Urine, Clean Catch  Result Value Ref Range Status   Specimen Description URINE, CLEAN CATCH  Final   Special Requests NONE  Final   Culture   Final    NO GROWTH Performed  at [facility], [address], [place], [place] [address]    Report Status 03/04/2021 FINAL  Final  C Difficile Quick Screen (NO PCR Reflex)     Status: None   Collection Time: 03/02/21  7:07 PM   Specimen: STOOL  Result Value Ref Range Status   C Diff antigen NEGATIVE NEGATIVE Final   C Diff toxin NEGATIVE NEGATIVE Final   C Diff interpretation No C. difficile detected.  Final    Comment: Performed at [facility], [place] [address] [place], [place] [address]        Radiology Studies: DG Abd 1 View  Result Date: 03/04/2021 CLINICAL DATA:  Constipation.  Abdominal distention. EXAM: ABDOMEN - 1 VIEW COMPARISON:  Radiograph dated 03/01/2021. FINDINGS: There is diffuse gaseous distension of the colon most consistent with adynamic ileus. Several dilated bowel more centrally likely represent dilated small bowel and measure up to approximately 7 cm concerning for development of ileus versus obstruction. No free air identified. Degenerative changes of the spine and osteopenia. Extensive lumbar fusion hardware. No acute osseous pathology. IMPRESSION: Dilatation of small bowel and colon may represent ileus or obstruction. Electronically Signed   By: Milas Hock  Radparvar M.D.   On: 03/04/2021 19:48        Scheduled Meds:  ascorbic acid  1,000 mg Oral Daily   aspirin EC  81 mg Oral Daily   calcium-vitamin D  2 tablet Oral Once per day on Mon Wed Fri   ciprofloxacin  500 mg Oral BID   docusate sodium  500 mg Oral QHS   loratadine  10 mg Oral Daily   multivitamin  1 tablet Oral Daily   pantoprazole  40 mg Oral Daily   polyethylene glycol  17 g Oral BID   potassium chloride  40 mEq Oral BID   rosuvastatin  10 mg Oral Daily   sodium chloride flush  3 mL Intravenous Q12H   Continuous Infusions:  sodium chloride 75 mL/hr at 03/02/21 0017   sodium chloride 75 mL/hr at 03/04/21 0330     LOS: 6 days     Cordelia Poche, MD [facility] 03/05/2021, 10:47 AM  If 7PM-7AM,  please contact night-coverage [email or web address]

## 2021-03-05 NOTE — Progress Notes (Signed)
6 Days Post-Op  Subjective: CC: Patient reports that she is tolerating liquids with small bites of fld trays w/o abdominal pain. Still feels distended. Has some nausea yesterday while mobilizing but denies any currently. No emesis. Passing flatus. She reports she passed flatus "all throughout the night". Liquid bm yesterday. She is unsure if this was before or after her suppository.   Objective: Vital signs in last 24 hours: Temp:  [97.8 F (36.6 C)-98.4 F (36.9 C)] 98.3 F (36.8 C) (08/23 0740) Pulse Rate:  [95-100] 100 (08/23 0308) Resp:  [20] 20 (08/23 0308) BP: (137-170)/(54-81) 170/81 (08/23 0308) SpO2:  [94 %-98 %] 98 % (08/23 0740) Last BM Date: 03/05/21  Intake/Output from previous day: 08/22 0701 - 08/23 0700 In: 802.7 [P.O.:480; I.V.:322.7] Out: -  Intake/Output this shift: No intake/output data recorded.  PE: Gen:  Alert, NAD, pleasant Pulm: normal rate and effort Abd: Soft with moderate distension. No tenderness, +BS Psych: A&Ox3  Skin: no rashes noted, warm and dry  Lab Results:  No results for input(s): WBC, HGB, HCT, PLT in the last 72 hours. BMET Recent Labs    03/03/21 0356 03/05/21 0739  NA 135 137  K 4.0 3.3*  CL 107 107  CO2 22 25  GLUCOSE 152* 132*  BUN 14 7*  CREATININE 0.70 0.55  CALCIUM 7.9* 8.2*   PT/INR No results for input(s): LABPROT, INR in the last 72 hours. CMP     Component Value Date/Time   NA 137 03/05/2021 0739   NA 142 04/24/2020 1600   K 3.3 (L) 03/05/2021 0739   CL 107 03/05/2021 0739   CO2 25 03/05/2021 0739   GLUCOSE 132 (H) 03/05/2021 0739   BUN 7 (L) 03/05/2021 0739   BUN 12 04/24/2020 1600   CREATININE 0.55 03/05/2021 0739   CALCIUM 8.2 (L) 03/05/2021 0739   PROT 4.9 (L) 03/05/2021 0739   PROT 6.6 04/24/2020 1600   ALBUMIN 2.4 (L) 03/05/2021 0739   ALBUMIN 4.5 04/24/2020 1600   AST 24 03/05/2021 0739   ALT 29 03/05/2021 0739   ALKPHOS 49 03/05/2021 0739   BILITOT 0.6 03/05/2021 0739   BILITOT 0.6  04/24/2020 1600   GFRNONAA >60 03/05/2021 0739   GFRAA 100 04/24/2020 1600   Lipase  No results found for: LIPASE  Studies/Results: DG Abd 1 View  Result Date: 03/04/2021 CLINICAL DATA:  Constipation.  Abdominal distention. EXAM: ABDOMEN - 1 VIEW COMPARISON:  Radiograph dated 03/01/2021. FINDINGS: There is diffuse gaseous distension of the colon most consistent with adynamic ileus. Several dilated bowel more centrally likely represent dilated small bowel and measure up to approximately 7 cm concerning for development of ileus versus obstruction. No free air identified. Degenerative changes of the spine and osteopenia. Extensive lumbar fusion hardware. No acute osseous pathology. IMPRESSION: Dilatation of small bowel and colon may represent ileus or obstruction. Electronically Signed   By: Anner Crete M.D.   On: 03/04/2021 19:48    Anti-infectives: Anti-infectives (From admission, onward)    Start     Dose/Rate Route Frequency Ordered Stop   03/04/21 0000  ciprofloxacin (CIPRO) 500 MG tablet        500 mg Oral 2 times daily 03/04/21 1609     03/01/21 2245  ciprofloxacin (CIPRO) tablet 500 mg        500 mg Oral 2 times daily 03/01/21 2159     02/27/21 2000  ceFAZolin (ANCEF) IVPB 2g/100 mL premix        2  g 200 mL/hr over 30 Minutes Intravenous Every 8 hours 02/27/21 1602 02/28/21 0356   02/27/21 0700  ceFAZolin (ANCEF) IVPB 2g/100 mL premix        2 g 200 mL/hr over 30 Minutes Intravenous On call to O.R. 02/27/21 0645 02/27/21 1240        Assessment/Plan Ileus  Hx of Anterior lateral interbody fusions L2-3 L3-4 posterior spinal fixation L1 L4 on 8/17 by Dr. Saintclair Halsted - Likely post-operative ileus that is now improving. CT 8/20 w/o bowel obstruction - Mobilize for bowel function.  - Keep K > 4 and Mg > 2 for bowel function.  - Cont bowel regimen. (Miralax/colace + PRN dulcolax) - Patient with ROBF and tolerating liquids/small bites of fld but still with some distension. Will adv  to soft diet. If tolerates, cleared from our standpoint.  FEN - Soft diet VTE - SCDs, okay for chemical prophylaxis from a general surgery standpoint ID - None indicated from our standpoint   LOS: 6 days    Jillyn Ledger , [facility] 03/05/2021, 9:06 AM Please see Amion for pager number during day hours 7:00am-4:30pm

## 2021-03-05 NOTE — Progress Notes (Signed)
PT Cancellation Note  Patient Details Name: Janice Pace MRN: [id number] DOB: January 27, 1946   Cancelled Treatment:    Reason Eval/Treat Not Completed: Other (comment) Attempted to see pt x 2. On first attempt, pt receiving suppository. On second attempt, pt refusing. Per RN, pt has been walking in hallway with her husband.  Wyona Almas, PT, DPT Acute Rehabilitation Services Pager [phone] Office [phone]    Deno Etienne 03/05/2021, 4:30 PM

## 2021-03-05 NOTE — Progress Notes (Signed)
Subjective: Patient reports  doing well passing gas had a bowel movement last night  Objective: Vital signs in last 24 hours: Temp:  [97.8 F (36.6 C)-98.4 F (36.9 C)] 97.8 F (36.6 C) (08/23 0308) Pulse Rate:  [95-100] 100 (08/23 0308) Resp:  [20] 20 (08/23 0308) BP: (137-170)/(54-81) 170/81 (08/23 0308) SpO2:  [94 %-97 %] 96 % (08/23 0308)  Intake/Output from previous day: 08/22 0701 - 08/23 0700 In: 802.7 [P.O.:480; I.V.:322.7] Out: -  Intake/Output this shift: No intake/output data recorded.  Awake alert strength 5 out of 5 wound clean dry and intact  Lab Results: No results for input(s): WBC, HGB, HCT, PLT in the last 72 hours. BMET Recent Labs    03/02/21 0821 03/03/21 0356  NA 138 135  K 3.9 4.0  CL 110 107  CO2 20* 22  GLUCOSE 176* 152*  BUN 21 14  CREATININE 0.96 0.70  CALCIUM 8.2* 7.9*    Studies/Results: DG Abd 1 View  Result Date: 03/04/2021 CLINICAL DATA:  Constipation.  Abdominal distention. EXAM: ABDOMEN - 1 VIEW COMPARISON:  Radiograph dated 03/01/2021. FINDINGS: There is diffuse gaseous distension of the colon most consistent with adynamic ileus. Several dilated bowel more centrally likely represent dilated small bowel and measure up to approximately 7 cm concerning for development of ileus versus obstruction. No free air identified. Degenerative changes of the spine and osteopenia. Extensive lumbar fusion hardware. No acute osseous pathology. IMPRESSION: Dilatation of small bowel and colon may represent ileus or obstruction. Electronically Signed   By: Anner Crete M.D.   On: 03/04/2021 19:48    Assessment/Plan: Postop day 6 lumbar spinal fusion with postoperative ileus seems to be resolving patient to be discharged later today if cleared by general surgery  LOS: 6 days     Janice Pace 03/05/2021, 7:46 AM

## 2021-03-06 ENCOUNTER — Inpatient Hospital Stay (HOSPITAL_COMMUNITY): Payer: Medicare Other

## 2021-03-06 LAB — BASIC METABOLIC PANEL
Anion gap: 7 (ref 5–15)
BUN: 9 mg/dL (ref 8–23)
CO2: 24 mmol/L (ref 22–32)
Calcium: 8.1 mg/dL — ABNORMAL LOW (ref 8.9–10.3)
Chloride: 107 mmol/L (ref 98–111)
Creatinine, Ser: 0.67 mg/dL (ref 0.44–1.00)
GFR, Estimated: >60 mL/min (ref >60)
Glucose, Bld: 118 mg/dL — ABNORMAL HIGH (ref 70–99)
Potassium: 3.2 mmol/L — ABNORMAL LOW (ref 3.5–5.1)
Sodium: 138 mmol/L (ref 135–145)

## 2021-03-06 LAB — MAGNESIUM: Magnesium: 2.2 mg/dL (ref 1.7–2.4)

## 2021-03-06 NOTE — Progress Notes (Signed)
Pt abdominal distention getting worst this morning. Pt very restless for unable to get rest since last night. Pt had 1 BM last night. Pt unable to pass gas and BM this morning. Will page N.P. Will continue to monitor.

## 2021-03-06 NOTE — Progress Notes (Signed)
7 Days Post-Op  Subjective: CC: Patient reports she was passing flatus yesterday and had 4-5 small liquid bm's. She has not passed flatus since yesterday and began to feel more distended, tight with generalized abdominal pressure. Denies abdominal pain. Primary team has ordered an NGT and Xray to confirm placement this am. NGT has already been placed.   Objective: Vital signs in last 24 hours: Temp:  [98 F (36.7 C)-98.7 F (37.1 C)] 98.4 F (36.9 C) (08/24 0800) Pulse Rate:  [89-100] 95 (08/24 0800) Resp:  [19-20] 19 (08/24 0800) BP: (150-171)/(67-90) 150/74 (08/24 0800) SpO2:  [95 %-98 %] 95 % (08/24 0800) Last BM Date: 03/05/21  Intake/Output from previous day: 08/23 0701 - 08/24 0700 In: 960 [P.O.:960] Out: -  Intake/Output this shift: No intake/output data recorded.  PE: Gen:  Alert, NAD, pleasant Pulm: normal rate and effort Abd: Moderate distension but remains soft. Some LUQ tenderness but otherwise NT and without peritonitis, +BS. NGT in place and clamped  Psych: A&Ox3  Skin: no rashes noted, warm and dry  Lab Results:  No results for input(s): WBC, HGB, HCT, PLT in the last 72 hours. BMET Recent Labs    03/05/21 0739  NA 137  K 3.3*  CL 107  CO2 25  GLUCOSE 132*  BUN 7*  CREATININE 0.55  CALCIUM 8.2*   PT/INR No results for input(s): LABPROT, INR in the last 72 hours. CMP     Component Value Date/Time   NA 137 03/05/2021 0739   NA 142 04/24/2020 1600   K 3.3 (L) 03/05/2021 0739   CL 107 03/05/2021 0739   CO2 25 03/05/2021 0739   GLUCOSE 132 (H) 03/05/2021 0739   BUN 7 (L) 03/05/2021 0739   BUN 12 04/24/2020 1600   CREATININE 0.55 03/05/2021 0739   CALCIUM 8.2 (L) 03/05/2021 0739   PROT 4.9 (L) 03/05/2021 0739   PROT 6.6 04/24/2020 1600   ALBUMIN 2.4 (L) 03/05/2021 0739   ALBUMIN 4.5 04/24/2020 1600   AST 24 03/05/2021 0739   ALT 29 03/05/2021 0739   ALKPHOS 49 03/05/2021 0739   BILITOT 0.6 03/05/2021 0739   BILITOT 0.6 04/24/2020  1600   GFRNONAA >60 03/05/2021 0739   GFRAA 100 04/24/2020 1600   Lipase  No results found for: LIPASE  Studies/Results: DG Abd 1 View  Result Date: 03/04/2021 CLINICAL DATA:  Constipation.  Abdominal distention. EXAM: ABDOMEN - 1 VIEW COMPARISON:  Radiograph dated 03/01/2021. FINDINGS: There is diffuse gaseous distension of the colon most consistent with adynamic ileus. Several dilated bowel more centrally likely represent dilated small bowel and measure up to approximately 7 cm concerning for development of ileus versus obstruction. No free air identified. Degenerative changes of the spine and osteopenia. Extensive lumbar fusion hardware. No acute osseous pathology. IMPRESSION: Dilatation of small bowel and colon may represent ileus or obstruction. Electronically Signed   By: Anner Crete M.D.   On: 03/04/2021 19:48    Anti-infectives: Anti-infectives (From admission, onward)    Start     Dose/Rate Route Frequency Ordered Stop   03/04/21 0000  ciprofloxacin (CIPRO) 500 MG tablet        500 mg Oral 2 times daily 03/04/21 1609     03/01/21 2245  ciprofloxacin (CIPRO) tablet 500 mg        500 mg Oral 2 times daily 03/01/21 2159     02/27/21 2000  ceFAZolin (ANCEF) IVPB 2g/100 mL premix        2 g  200 mL/hr over 30 Minutes Intravenous Every 8 hours 02/27/21 1602 02/28/21 0356   02/27/21 0700  ceFAZolin (ANCEF) IVPB 2g/100 mL premix        2 g 200 mL/hr over 30 Minutes Intravenous On call to O.R. 02/27/21 0645 02/27/21 1240        Assessment/Plan Ileus  Hx of Anterior lateral interbody fusions L2-3 L3-4 posterior spinal fixation L1 L4 on 8/17 by Dr. Saintclair Halsted - Likely post-operative ileus. CT 8/20 w/o bowel obstruction - NGT replaced this am. Will follow up on xray to confirm placement  - Mobilize for bowel function.  - Keep K > 4 and Mg > 2 for bowel function.    FEN - NPO, NGT to LIWS when NGT placement confirmed, IVF per primary  VTE - SCDs, okay for chemical prophylaxis from a  general surgery standpoint ID - On cipro per primary. None indicated from our standpoint   LOS: 7 days    Jillyn Ledger , [facility] 03/06/2021, 11:06 AM Please see Amion for pager number during day hours 7:00am-4:30pm

## 2021-03-06 NOTE — Care Management (Signed)
Spoke w patient, she does not need DME RW 3/1, she has these at home already.

## 2021-03-06 NOTE — Progress Notes (Signed)
Subjective: Patient reports no pain in her back but her stomach is really uncomfortable. She is more distended today and not passing gas anymore   Objective: Vital signs in last 24 hours: Temp:  [98 F (36.7 C)-98.7 F (37.1 C)] 98.4 F (36.9 C) (08/24 0800) Pulse Rate:  [89-100] 95 (08/24 0800) Resp:  [19-20] 19 (08/24 0800) BP: (150-171)/(67-90) 150/74 (08/24 0800) SpO2:  [95 %-98 %] 95 % (08/24 0800)  Intake/Output from previous day: 08/23 0701 - 08/24 0700 In: 960 [P.O.:960] Out: -  Intake/Output this shift: No intake/output data recorded.  Neurologic: Grossly normal  Lab Results: Lab Results  Component Value Date   WBC 11.7 (H) 03/01/2021   HGB 12.5 03/01/2021   HCT 36.2 03/01/2021   MCV 93.5 03/01/2021   PLT 157 03/01/2021   No results found for: INR, PROTIME BMET Lab Results  Component Value Date   NA 137 03/05/2021   K 3.3 (L) 03/05/2021   CL 107 03/05/2021   CO2 25 03/05/2021   GLUCOSE 132 (H) 03/05/2021   BUN 7 (L) 03/05/2021   CREATININE 0.55 03/05/2021   CALCIUM 8.2 (L) 03/05/2021    Studies/Results: DG Abd 1 View  Result Date: 03/04/2021 CLINICAL DATA:  Constipation.  Abdominal distention. EXAM: ABDOMEN - 1 VIEW COMPARISON:  Radiograph dated 03/01/2021. FINDINGS: There is diffuse gaseous distension of the colon most consistent with adynamic ileus. Several dilated bowel more centrally likely represent dilated small bowel and measure up to approximately 7 cm concerning for development of ileus versus obstruction. No free air identified. Degenerative changes of the spine and osteopenia. Extensive lumbar fusion hardware. No acute osseous pathology. IMPRESSION: Dilatation of small bowel and colon may represent ileus or obstruction. Electronically Signed   By: Anner Crete M.D.   On: 03/04/2021 19:48    Assessment/Plan: Will order another KUB. Place NG tube. General surgery to follow up with her about ileus.   LOS: 7 days    Ocie Cornfield  [facility] 03/06/2021, 10:36 AM

## 2021-03-06 NOTE — Progress Notes (Signed)
PT Cancellation Note  Patient Details Name: Janice Pace MRN: [id number] DOB: 20-Feb-1946   Cancelled Treatment:    Reason Eval/Treat Not Completed: Pain limiting ability to participate. Pt with increased abdominal distention and thus pain, requesting PT return later as she cannot tolerate the pain to get up OOB at this time. Educated pt on sitting up and regular mobility to encourage a bowel movement and positioned bed in chair-like position. Will plan to follow-up later per pt request as time permits.   Moishe Spice, PT, DPT Acute Rehabilitation Services  Pager: [phone] Office: [place] 03/06/2021, 11:49 AM

## 2021-03-06 NOTE — Progress Notes (Signed)
Physical Therapy Treatment Patient Details Name: Janice Pace MRN: [id number] DOB: Feb 28, 1946 Today's Date: 03/06/2021    History of Present Illness Pt is a 75 y/o female presenting with radicular lumbar symptoms s/p elective L2-L4 ALIF on 02/27/2021. Post-surgical complication of CT scan of abdomen pelvis was consistent with ileus. NG tube placed 8/24. PMHx significant for several back surgeries including previous L4-S1 fusion in 2013, CA, heart murmur and valve disorder, pre-diabetes.    PT Comments    Pt agreeable to getting OOB to commode in order to try to relieve back stiffness and back and abdominal discomfort/pain. Success in relieving back pain with mobility. Still unable to have bowel movement though. Pt needing modA to transition supine > sit EOB and minA for UE support and steadying with transfers today. Limited in mobility away from EOB by NG tube attached to wall suction. Will continue to follow acutely. Current recommendations remain appropriate.    Follow Up Recommendations  Home health PT;Supervision for mobility/OOB     Equipment Recommendations  Rolling walker with 5" wheels;3in1 (PT)    Recommendations for Other Services       Precautions / Restrictions Precautions Precautions: Back;Fall Precaution Booklet Issued: Yes (comment) Precaution Comments: NG tube to wall suction; Reviewed handout and pt was cued for precautions during functional mobility. Required Braces or Orthoses: Spinal Brace Spinal Brace: Lumbar corset;Applied in sitting position Restrictions Weight Bearing Restrictions: No    Mobility  Bed Mobility Overal bed mobility: Needs Assistance Bed Mobility: Supine to Sit;Sit to Supine   Sidelying to sit: Mod assist;HOB elevated   Sit to supine: Min guard;HOB elevated   General bed mobility comments: HOB elevated, cuing pt to bring legs to EOB. Pt pulling up on therapist to ascend trunk due to abdominal pain, performed pivot on buttocks to  maintain spinal precautions. Min guard assist to return to supine with HOB elevated and pt using rails.    Transfers Overall transfer level: Needs assistance Equipment used: 1 person hand held assist Transfers: Sit to/from [facility] Sit to Stand: Min assist Stand pivot transfers: Min assist       General transfer comment: Sit to stand with side steps from EOB <> commode with pt supporting self on therapist, minA.  Ambulation/Gait Ambulation/Gait assistance: Min assist Gait Distance (Feet): 3 Feet (x2 bouts of ~2-3 ft each) Assistive device: 1 person hand held assist Gait Pattern/deviations: Step-through pattern;Decreased stride length Gait velocity: Decreased Gait velocity interpretation: <1.31 ft/sec, indicative of household ambulator General Gait Details: Pt with slow, short steps laterally to transfer bed <> commode holding onto therapist for support.   Stairs             Wheelchair Mobility    Modified Rankin (Stroke Patients Only)       Balance Overall balance assessment: Needs assistance Sitting-balance support: No upper extremity supported;Feet supported Sitting balance-Leahy Scale: Fair Sitting balance - Comments: Static sitting EOB with supervision.   Standing balance support: Single extremity supported;No upper extremity supported;Bilateral upper extremity supported Standing balance-Leahy Scale: Fair Standing balance comment: Able to stand statically without UE support briefly, but UE support for mobility.                            Cognition Arousal/Alertness: Awake/alert Behavior During Therapy: WFL for tasks assessed/performed Overall Cognitive Status: Impaired/Different from baseline Area of Impairment: Memory  Memory: Decreased short-term memory;Decreased recall of precautions         General Comments: Needs reminders to maintain spinal precautions.      Exercises      General  Comments General comments (skin integrity, edema, etc.): Able to urinate on commode but no bowel movement      Pertinent Vitals/Pain Pain Assessment: Faces Faces Pain Scale: Hurts little more Pain Location: abdomen, Low back (incisional) Pain Descriptors / Indicators: Sore;Grimacing;Operative site guarding Pain Intervention(s): Limited activity within patient's tolerance;Monitored during session;Repositioned    Home Living                      Prior Function            PT Goals (current goals can now be found in the care plan section) Acute Rehab PT Goals Patient Stated Goal: to feel better PT Goal Formulation: With patient/family Time For Goal Achievement: 03/07/21 Potential to Achieve Goals: Good Progress towards PT goals: Not progressing toward goals - comment (limited by abdominal distention and pain today)    Frequency    Min 5X/week      PT Plan Current plan remains appropriate    Co-evaluation              AM-PAC PT "6 Clicks" Mobility   Outcome Measure  Help needed turning from your back to your side while in a flat bed without using bedrails?: A Little Help needed moving from lying on your back to sitting on the side of a flat bed without using bedrails?: A Lot Help needed moving to and from a bed to a chair (including a wheelchair)?: A Little Help needed standing up from a chair using your arms (e.g., wheelchair or bedside chair)?: A Little Help needed to walk in hospital room?: A Little Help needed climbing 3-5 steps with a railing? : A Little 6 Click Score: 17    End of Session   Activity Tolerance: Patient limited by pain Patient left: with call bell/phone within reach;with family/visitor present;in bed;with bed alarm set   PT Visit Diagnosis: Unsteadiness on feet (R26.81);Pain;Other abnormalities of gait and mobility (R26.89);Difficulty in walking, not elsewhere classified (R26.2) Pain - part of body:  (back)     Time:  1610-1630 PT Time Calculation (min) (ACUTE ONLY): 20 min  Charges:  $Therapeutic Activity: 8-22 mins                     Moishe Spice, PT, DPT Acute Rehabilitation Services  Pager: [phone] Office: [place] 03/06/2021, 4:34 PM

## 2021-03-07 ENCOUNTER — Inpatient Hospital Stay (HOSPITAL_COMMUNITY): Payer: Medicare Other

## 2021-03-07 LAB — BASIC METABOLIC PANEL
Anion gap: 7 (ref 5–15)
BUN: 8 mg/dL (ref 8–23)
CO2: 23 mmol/L (ref 22–32)
Calcium: 8 mg/dL — ABNORMAL LOW (ref 8.9–10.3)
Chloride: 107 mmol/L (ref 98–111)
Creatinine, Ser: 0.59 mg/dL (ref 0.44–1.00)
GFR, Estimated: >60 mL/min (ref >60)
Glucose, Bld: 108 mg/dL — ABNORMAL HIGH (ref 70–99)
Potassium: 3.3 mmol/L — ABNORMAL LOW (ref 3.5–5.1)
Sodium: 137 mmol/L (ref 135–145)

## 2021-03-07 LAB — MAGNESIUM: Magnesium: 2 mg/dL (ref 1.7–2.4)

## 2021-03-07 MED ORDER — IOHEXOL 9 MG/ML PO SOLN
500.0000 mL | ORAL | Status: AC
  Administered 2021-03-07: 500 mL via ORAL

## 2021-03-07 NOTE — Progress Notes (Signed)
Subjective: Patient reports mild back pain. Stomach is feeling better today, passing gas and had a bm last night. NG tube putting out.   Objective: Vital signs in last 24 hours: Temp:  [97.9 F (36.6 C)-98.3 F (36.8 C)] 98.2 F (36.8 C) (08/25 0810) Pulse Rate:  [90-100] 95 (08/25 0810) Resp:  [14-20] 14 (08/25 0810) BP: (154-175)/(66-81) 164/71 (08/25 0810) SpO2:  [93 %-96 %] 93 % (08/25 0810) Weight:  [100.6 kg] 100.6 kg (08/25 0309)  Intake/Output from previous day: No intake/output data recorded. Intake/Output this shift: No intake/output data recorded.  Neurologic: Grossly normal  Lab Results: Lab Results  Component Value Date   WBC 11.7 (H) 03/01/2021   HGB 12.5 03/01/2021   HCT 36.2 03/01/2021   MCV 93.5 03/01/2021   PLT 157 03/01/2021   No results found for: INR, PROTIME BMET Lab Results  Component Value Date   NA 137 03/07/2021   K 3.3 (L) 03/07/2021   CL 107 03/07/2021   CO2 23 03/07/2021   GLUCOSE 108 (H) 03/07/2021   BUN 8 03/07/2021   CREATININE 0.59 03/07/2021   CALCIUM 8.0 (L) 03/07/2021    Studies/Results: DG Abd 1 View  Result Date: 03/06/2021 CLINICAL DATA:  NG tube placement, abdominal distention EXAM: ABDOMEN - 1 VIEW COMPARISON:  KUB 03/04/2021, CT abdomen/pelvis 03/02/2021 FINDINGS: The enteric catheter tip and side hole project over the stomach. There are markedly gas distended loops of bowel throughout the imaged abdomen measuring up to approximately 6.9 cm. The appearance is overall similar to the prior study. There is no definite free intraperitoneal air, though evaluation is limited given supine technique. The lung bases are clear.  Spinal fusion hardware is again noted. IMPRESSION: 1. Enteric catheter tip and side hole project over the stomach. 2. Marked gaseous distention of bowel throughout the imaged abdomen is overall similar to the prior study. This may reflect ileus or obstruction. Electronically Signed   By: Valetta Mole M.D.   On:  03/06/2021 11:58    Assessment/Plan: S/p lumbar fusion with ileus. Will defer to general surgery on next steps for ileus. Continue to mobilize today.    LOS: 8 days    Ocie Cornfield Philippa Vessey 03/07/2021, 8:14 AM

## 2021-03-07 NOTE — Progress Notes (Signed)
8 Days Post-Op  Subjective: CC: Patient had more BMs and flatus overnight, but still with about 600cc of bilious output in her NGT.  Not mobilizing much  Objective: Vital signs in last 24 hours: Temp:  [97.9 F (36.6 C)-98.3 F (36.8 C)] 98.2 F (36.8 C) (08/25 0810) Pulse Rate:  [90-100] 95 (08/25 0810) Resp:  [14-20] 14 (08/25 0810) BP: (154-175)/(66-81) 164/71 (08/25 0810) SpO2:  [93 %-96 %] 93 % (08/25 0810) Weight:  [100.6 kg] 100.6 kg (08/25 0309) Last BM Date: 03/05/21  Intake/Output from previous day: No intake/output data recorded. Intake/Output this shift: No intake/output data recorded.  PE: Gen:  Alert, NAD, pleasant Pulm: normal rate and effort Abd: Moderate distension but remains soft. NT, +BS. NGT in place with bilious output Psych: A&Ox3    Lab Results:  No results for input(s): WBC, HGB, HCT, PLT in the last 72 hours. BMET Recent Labs    03/06/21 1633 03/07/21 0305  NA 138 137  K 3.2* 3.3*  CL 107 107  CO2 24 23  GLUCOSE 118* 108*  BUN 9 8  CREATININE 0.67 0.59  CALCIUM 8.1* 8.0*   PT/INR No results for input(s): LABPROT, INR in the last 72 hours. CMP     Component Value Date/Time   NA 137 03/07/2021 0305   NA 142 04/24/2020 1600   K 3.3 (L) 03/07/2021 0305   CL 107 03/07/2021 0305   CO2 23 03/07/2021 0305   GLUCOSE 108 (H) 03/07/2021 0305   BUN 8 03/07/2021 0305   BUN 12 04/24/2020 1600   CREATININE 0.59 03/07/2021 0305   CALCIUM 8.0 (L) 03/07/2021 0305   PROT 4.9 (L) 03/05/2021 0739   PROT 6.6 04/24/2020 1600   ALBUMIN 2.4 (L) 03/05/2021 0739   ALBUMIN 4.5 04/24/2020 1600   AST 24 03/05/2021 0739   ALT 29 03/05/2021 0739   ALKPHOS 49 03/05/2021 0739   BILITOT 0.6 03/05/2021 0739   BILITOT 0.6 04/24/2020 1600   GFRNONAA >60 03/07/2021 0305   GFRAA 100 04/24/2020 1600   Lipase  No results found for: LIPASE  Studies/Results: DG Abd 1 View  Result Date: 03/06/2021 CLINICAL DATA:  NG tube placement, abdominal  distention EXAM: ABDOMEN - 1 VIEW COMPARISON:  KUB 03/04/2021, CT abdomen/pelvis 03/02/2021 FINDINGS: The enteric catheter tip and side hole project over the stomach. There are markedly gas distended loops of bowel throughout the imaged abdomen measuring up to approximately 6.9 cm. The appearance is overall similar to the prior study. There is no definite free intraperitoneal air, though evaluation is limited given supine technique. The lung bases are clear.  Spinal fusion hardware is again noted. IMPRESSION: 1. Enteric catheter tip and side hole project over the stomach. 2. Marked gaseous distention of bowel throughout the imaged abdomen is overall similar to the prior study. This may reflect ileus or obstruction. Electronically Signed   By: Valetta Mole M.D.   On: 03/06/2021 11:58   DG Abd Portable 1V  Result Date: 03/07/2021 CLINICAL DATA:  Ileus EXAM: PORTABLE ABDOMEN - 1 VIEW COMPARISON:  03/06/2021 FINDINGS: Lumbar fusion hardware again seen. Nasogastric tube is not significantly changed in position. Diffuse small bowel dilatation is mildly improved since prior exam. IMPRESSION: Mild interval improvement of diffuse small bowel dilatation, consistent with ileus. Electronically Signed   By: Miachel Roux M.D.   On: 03/07/2021 09:14    Anti-infectives: Anti-infectives (From admission, onward)    Start     Dose/Rate Route Frequency Ordered Stop  03/04/21 0000  ciprofloxacin (CIPRO) 500 MG tablet        500 mg Oral 2 times daily 03/04/21 1609     03/01/21 2245  ciprofloxacin (CIPRO) tablet 500 mg        500 mg Oral 2 times daily 03/01/21 2159     02/27/21 2000  ceFAZolin (ANCEF) IVPB 2g/100 mL premix        2 g 200 mL/hr over 30 Minutes Intravenous Every 8 hours 02/27/21 1602 02/28/21 0356   02/27/21 0700  ceFAZolin (ANCEF) IVPB 2g/100 mL premix        2 g 200 mL/hr over 30 Minutes Intravenous On call to O.R. 02/27/21 0645 02/27/21 1240        Assessment/Plan Ileus  Hx of Anterior lateral  interbody fusions L2-3 L3-4 posterior spinal fixation L1 L4 on 8/17 by Dr. Saintclair Halsted - Likely post-operative ileus. CT 8/20 w/o bowel obstruction, but given persistent need for NGT will rescan today to just confirm no other complications other than suspected ileus. -NGT for now - Mobilize for bowel function.  - Keep K > 4 and Mg > 2 for bowel function.    FEN - NPO, NGT to LIWS , IVF per primary  VTE - SCDs, okay for chemical prophylaxis from a general surgery standpoint ID - On cipro per primary. None indicated from our standpoint   LOS: 8 days    Henreitta Cea , [facility] Surgery 03/07/2021, 10:52 AM Please see Amion for pager number during day hours 7:00am-4:30pm

## 2021-03-07 NOTE — Progress Notes (Signed)
Physical Therapy Treatment Patient Details Name: Janice Pace MRN: [id number] DOB: 19-Jan-1946 Today's Date: 03/07/2021    History of Present Illness Pt is a 75 y/o female presenting with radicular lumbar symptoms s/p elective L2-L4 ALIF on 02/27/2021. Post-surgical complication of CT scan of abdomen pelvis was consistent with ileus. NG tube placed 8/24. PMHx significant for several back surgeries including previous L4-S1 fusion in 2013, CA, heart murmur and valve disorder, pre-diabetes.    PT Comments    Pt limited in mobility away from EOB by NG tube to wall suction this date. Thus, focused session on lower extremity exercises in supine and in standing to increase strength and facilitate improved bowel activity. Pt fatigues with standing activity, suggesting generalized weakness and decreased activity tolerance. Will continue to follow acutely. Current recommendations remain appropriate.    Follow Up Recommendations  Home health PT;Supervision for mobility/OOB     Equipment Recommendations  Rolling walker with 5" wheels;3in1 (PT)    Recommendations for Other Services       Precautions / Restrictions Precautions Precautions: Back;Fall Precaution Booklet Issued: Yes (comment) Precaution Comments: NG tube to wall suction; Reviewed handout and pt was cued for precautions during functional mobility. Required Braces or Orthoses: Spinal Brace Spinal Brace: Lumbar corset;Applied in sitting position Restrictions Weight Bearing Restrictions: No    Mobility  Bed Mobility Overal bed mobility: Needs Assistance Bed Mobility: Rolling;Sidelying to Sit;Sit to Supine Rolling: Supervision Sidelying to sit: HOB elevated;Supervision   Sit to supine: HOB elevated;Supervision   General bed mobility comments: Extra time but pt able to perform all aspects with supervision for safety, using bed rails with HOB elevated. cues to maintain spinal precautions.    Transfers Overall transfer level:  Needs assistance Equipment used: Rolling walker (2 wheeled) Transfers: Sit to/from Stand Sit to Stand: Supervision         General transfer comment: Supervision for safety and cues for hand placement sit <> stand, x6 reps from EOB.  Ambulation/Gait             General Gait Details: Deferred to focus on exercises as pt limited in mobility by NG tube to wall suction.   Stairs             Wheelchair Mobility    Modified Rankin (Stroke Patients Only)       Balance Overall balance assessment: Needs assistance Sitting-balance support: No upper extremity supported;Feet supported Sitting balance-Leahy Scale: Fair Sitting balance - Comments: Static sitting EOB with supervision.   Standing balance support: Bilateral upper extremity supported Standing balance-Leahy Scale: Fair Standing balance comment: Able to stand statically without UE support briefly, but UE support for mobility.                            Cognition Arousal/Alertness: Awake/alert Behavior During Therapy: WFL for tasks assessed/performed Overall Cognitive Status: Within Functional Limits for tasks assessed                                        Exercises General Exercises - Lower Extremity Ankle Circles/Pumps: AROM;Both;15 reps;Supine Gluteal Sets: Strengthening;Both;10 reps;Supine Heel Slides: AROM;Both;10 reps;Supine Hip ABduction/ADduction: AROM;Both;10 reps;Supine Hip Flexion/Marching: AROM;Both;Other reps (comment);Standing (x7 reps with RW) Toe Raises: AROM;Both;Other reps (comment);Standing (x3 reps with RW) Heel Raises: AROM;Both;10 reps;Standing (with RW) Mini-Sqauts: Strengthening;Both;Other reps (comment) (x6 reps sit to stand from EOB)  General Comments        Pertinent Vitals/Pain Pain Assessment: Faces Faces Pain Scale: Hurts little more Pain Location: abdomen, Low back (incisional) Pain Descriptors / Indicators: Sore;Grimacing;Operative site  guarding Pain Intervention(s): Limited activity within patient's tolerance;Monitored during session;Repositioned    Home Living                      Prior Function            PT Goals (current goals can now be found in the care plan section) Acute Rehab PT Goals Patient Stated Goal: to feel better PT Goal Formulation: With patient/family Time For Goal Achievement: 03/21/21 Potential to Achieve Goals: Good Progress towards PT goals: Progressing toward goals    Frequency    Min 5X/week      PT Plan Current plan remains appropriate    Co-evaluation              AM-PAC PT "6 Clicks" Mobility   Outcome Measure  Help needed turning from your back to your side while in a flat bed without using bedrails?: A Little Help needed moving from lying on your back to sitting on the side of a flat bed without using bedrails?: A Little Help needed moving to and from a bed to a chair (including a wheelchair)?: A Little Help needed standing up from a chair using your arms (e.g., wheelchair or bedside chair)?: A Little Help needed to walk in hospital room?: A Little Help needed climbing 3-5 steps with a railing? : A Little 6 Click Score: 18    End of Session Equipment Utilized During Treatment: Back brace Activity Tolerance: Patient limited by pain;Patient limited by fatigue Patient left: with call bell/phone within reach;with family/visitor present;in bed;with bed alarm set   PT Visit Diagnosis: Unsteadiness on feet (R26.81);Pain;Other abnormalities of gait and mobility (R26.89);Difficulty in walking, not elsewhere classified (R26.2) Pain - part of body:  (back)     Time: [id number] PT Time Calculation (min) (ACUTE ONLY): 17 min  Charges:  $Therapeutic Exercise: 8-22 mins                     Moishe Spice, PT, DPT Acute Rehabilitation Services  Pager: [phone] Office: [place] 03/07/2021, 1:29 PM

## 2021-03-07 NOTE — Progress Notes (Signed)
OT Cancellation Note  Patient Details Name: Janice Pace MRN: [id number] DOB: 08-12-1945   Cancelled Treatment:    Reason Eval/Treat Not Completed: Patient declined reporting just getting back to bed after PT treatment session. OT to return 8/25.   Gloris Manchester OTR/L Supplemental OT, Department of rehab services [phone]  [name] R H. 03/07/2021, 1:35 PM

## 2021-03-08 LAB — BASIC METABOLIC PANEL
Anion gap: 8 (ref 5–15)
BUN: 6 mg/dL — ABNORMAL LOW (ref 8–23)
CO2: 23 mmol/L (ref 22–32)
Calcium: 7.8 mg/dL — ABNORMAL LOW (ref 8.9–10.3)
Chloride: 103 mmol/L (ref 98–111)
Creatinine, Ser: 0.6 mg/dL (ref 0.44–1.00)
GFR, Estimated: >60 mL/min (ref >60)
Glucose, Bld: 91 mg/dL (ref 70–99)
Potassium: 3.1 mmol/L — ABNORMAL LOW (ref 3.5–5.1)
Sodium: 134 mmol/L — ABNORMAL LOW (ref 135–145)

## 2021-03-08 MED ORDER — POTASSIUM CHLORIDE 10 MEQ/100ML IV SOLN
10.0000 meq | INTRAVENOUS | Status: AC
Start: 2021-03-08 — End: 2021-03-08
  Administered 2021-03-08 (×2): 10 meq via INTRAVENOUS
  Filled 2021-03-08 (×5): qty 100

## 2021-03-08 NOTE — Progress Notes (Signed)
Occupational Therapy Treatment Patient Details Name: Janice Pace MRN: [id number] DOB: 13-Mar-1946 Today's Date: 03/08/2021    History of present illness Pt is a 75 y/o female presenting with radicular lumbar symptoms s/p elective L2-L4 ALIF on 02/27/2021. Post-surgical complication of CT scan of abdomen pelvis was consistent with ileus. NG tube placed 8/24. PMHx significant for several back surgeries including previous L4-S1 fusion in 2013, CA, heart murmur and valve disorder, pre-diabetes.   OT comments  Pt progressing toward home d/c goals at this time demonstrates min guard bed mobility exiting R side. Pt requires (A) for peri care this session so next session to discuss spouse vs AE use. Pt able to dress LB with AE min guard at this time ( already has reacherx3 at home). Recommendation for HHOT  Pt has an elderly female jack russel at home that spouse has agreed to care for and will keep outside to decrease fall risk upon initial return home.    Follow Up Recommendations  Home health OT;Supervision/Assistance - 24 hour    Equipment Recommendations  3 in 1 bedside commode;Other (comment)    Recommendations for Other Services      Precautions / Restrictions Precautions Precautions: Back;Fall Required Braces or Orthoses: Spinal Brace Spinal Brace: Lumbar corset;Applied in sitting position       Mobility Bed Mobility Overal bed mobility: Needs Assistance Bed Mobility: Supine to Sit;Sit to Supine Rolling: Min guard Sidelying to sit: Min guard Supine to sit: Min guard Sit to supine: Min guard   General bed mobility comments: pt exiting on the right just like at home. pt expressed its harder than the right side. educated on switching sheets and pillows to help with exiting on the Left if needed at home. pt able to return demo on R this session    Transfers                      Balance Overall balance assessment: Needs assistance Sitting-balance support: Bilateral  upper extremity supported;Feet supported Sitting balance-Leahy Scale: Fair     Standing balance support: Bilateral upper extremity supported;During functional activity Standing balance-Leahy Scale: Fair                             ADL either performed or assessed with clinical judgement   ADL Overall ADL's : Needs assistance/impaired   Eating/Feeding Details (indicate cue type and reason): lunch liquids in the room with no intake from patient. pt expressed not wanting any pain medications due to ileus                 Lower Body Dressing: Minimal assistance;Adhering to back precautions;With adaptive equipment;Sit to/from stand Lower Body Dressing Details (indicate cue type and reason): educated on use of reacher that patient has at home. spouse reports 3 in the home Toilet Transfer: Min guard;BSC   Toileting- Water quality scientist and Hygiene: Maximal assistance       Functional mobility during ADLs: Min guard;Rolling walker General ADL Comments: Pt educated on bed positioning, transfer to shower with 3n1 positioning (sequence), use of AE for LB dressing,setting alarm on phone with demonstration for medications due to cognitive deficits noted, washing with clean linen, setting up environment for patient by spouse for snacks and water bottles.     Vision       Perception     Praxis      Cognition Arousal/Alertness: Awake/alert Behavior During Therapy: [facility] for tasks  assessed/performed Overall Cognitive Status: Impaired/Different from baseline Area of Impairment: Memory                     Memory: Decreased short-term memory         General Comments: pt poor recall of house setup of bed in room. pts spouse present and correcting patient about positioning. OT able to provide feedback and correct sequence to simulate home based on this informatoin        Exercises     Shoulder Instructions       General Comments VSS    Pertinent Vitals/  Pain       Pain Assessment: Faces Faces Pain Scale: Hurts a little bit Pain Location: back Pain Descriptors / Indicators: Operative site guarding Pain Intervention(s): Monitored during session;Repositioned  Home Living                                          Prior Functioning/Environment              Frequency  Min 2X/week        Progress Toward Goals  OT Goals(current goals can now be found in the care plan section)  Progress towards OT goals: Progressing toward goals  Acute Rehab OT Goals Patient Stated Goal: to be able to go to the bathroom without any pain meds causing a setback OT Goal Formulation: With patient/family Time For Goal Achievement: 03/14/21 Potential to Achieve Goals: Good ADL Goals Pt Will Perform Grooming: with modified independence;standing Pt Will Perform Upper Body Dressing: with modified independence;sitting Pt Will Perform Lower Body Dressing: with modified independence;sit to/from stand;with adaptive equipment Pt Will Transfer to Toilet: with modified independence;ambulating Pt Will Perform Toileting - Clothing Manipulation and hygiene: with modified independence;sit to/from stand Pt Will Perform Tub/Shower Transfer: 3 in 1;rolling walker Additional ADL Goal #1: Patient will recall 3/3 spinal precautions without cues, demonstrating good return demo during ADLs.  Plan Discharge plan remains appropriate;Frequency remains appropriate    Co-evaluation                 AM-PAC OT "6 Clicks" Daily Activity     Outcome Measure   Help from another person eating meals?: None Help from another person taking care of personal grooming?: A Little Help from another person toileting, which includes using toliet, bedpan, or urinal?: A Little Help from another person bathing (including washing, rinsing, drying)?: A Little Help from another person to put on and taking off regular upper body clothing?: A Little Help from another  person to put on and taking off regular lower body clothing?: A Little 6 Click Score: 19    End of Session Equipment Utilized During Treatment: Gait belt;Rolling walker;Back brace  OT Visit Diagnosis: Unsteadiness on feet (R26.81);Muscle weakness (generalized) (M62.81);Pain   Activity Tolerance Patient tolerated treatment well   Patient Left in bed;with call bell/phone within reach;with bed alarm set;with family/visitor present   Nurse Communication Mobility status;Precautions        Time: [id number] OT Time Calculation (min): 41 min  Charges: OT General Charges $OT Visit: 1 Visit OT Treatments $Self Care/Home Management : 38-52 mins   Brynn, OTR/L  Acute Rehabilitation Services Pager: [phone] Office: [phone] .    Jeri Modena 03/08/2021, 2:04 PM

## 2021-03-08 NOTE — Progress Notes (Signed)
9 Days Post-Op  Subjective: Still with more bowel function.  CT with adynamic ileus, but contrast transits to the colon by the time of imaging.  Objective: Vital signs in last 24 hours: Temp:  [97.6 F (36.4 C)-98.7 F (37.1 C)] 97.8 F (36.6 C) (08/26 0834) Pulse Rate:  [88-100] 93 (08/26 0834) Resp:  [15] 15 (08/25 1156) BP: (150-168)/(62-75) 157/68 (08/26 0834) SpO2:  [92 %-97 %] 93 % (08/26 0834) Weight:  [94.7 kg] 94.7 kg (08/26 0227) Last BM Date: 03/07/21  Intake/Output from previous day: 08/25 0701 - 08/26 0700 In: -  Out: 250 [Urine:250] Intake/Output this shift: No intake/output data recorded.  PE: Gen:  Alert, NAD, pleasant Pulm: normal rate and effort Abd: Moderate distension but remains soft. NT, +BS. NGT in place with bilious output, but old as it is clamped currently for med administration. Psych: A&Ox3    Lab Results:  No results for input(s): WBC, HGB, HCT, PLT in the last 72 hours. BMET Recent Labs    03/06/21 1633 03/07/21 0305  NA 138 137  K 3.2* 3.3*  CL 107 107  CO2 24 23  GLUCOSE 118* 108*  BUN 9 8  CREATININE 0.67 0.59  CALCIUM 8.1* 8.0*   PT/INR No results for input(s): LABPROT, INR in the last 72 hours. CMP     Component Value Date/Time   NA 137 03/07/2021 0305   NA 142 04/24/2020 1600   K 3.3 (L) 03/07/2021 0305   CL 107 03/07/2021 0305   CO2 23 03/07/2021 0305   GLUCOSE 108 (H) 03/07/2021 0305   BUN 8 03/07/2021 0305   BUN 12 04/24/2020 1600   CREATININE 0.59 03/07/2021 0305   CALCIUM 8.0 (L) 03/07/2021 0305   PROT 4.9 (L) 03/05/2021 0739   PROT 6.6 04/24/2020 1600   ALBUMIN 2.4 (L) 03/05/2021 0739   ALBUMIN 4.5 04/24/2020 1600   AST 24 03/05/2021 0739   ALT 29 03/05/2021 0739   ALKPHOS 49 03/05/2021 0739   BILITOT 0.6 03/05/2021 0739   BILITOT 0.6 04/24/2020 1600   GFRNONAA >60 03/07/2021 0305   GFRAA 100 04/24/2020 1600   Lipase  No results found for: LIPASE  Studies/Results: CT ABDOMEN PELVIS WO  CONTRAST  Result Date: 03/07/2021 CLINICAL DATA:  Abdominal distention. Suspect postoperative ileus after back surgery. EXAM: CT ABDOMEN AND PELVIS WITHOUT CONTRAST TECHNIQUE: Multidetector CT imaging of the abdomen and pelvis was performed following the standard protocol without IV contrast. COMPARISON:  03/02/2021 FINDINGS: Lower chest: Linear atelectasis in the lung bases. Calcified granuloma on the right. Hepatobiliary: Diffuse fatty infiltration of the liver. No focal lesions. Gallbladder and bile ducts are unremarkable. Pancreas: Unremarkable. No pancreatic ductal dilatation or surrounding inflammatory changes. Spleen: Normal in size without focal abnormality. Adrenals/Urinary Tract: Left adrenal gland nodule measuring 1.6 cm diameter. Density measurements of 7 Hounsfield units consistent with benign fat containing adenoma. Right adrenal gland nodule measuring 1.7 cm diameter. Density measurements of 14 Hounsfield units likely indicate fat containing adenoma. No change in appearance since prior study. Kidneys are symmetrical in size. No renal stones. No hydronephrosis or hydroureter. Bladder is unremarkable. Stomach/Bowel: There is an enteric tube with tip terminating in the body of the stomach. Dilated gas-filled small bowel and dilated transverse colon with gas. Contrast material flows through to the cecum suggesting no evidence of bowel obstruction. Changes likely represent ileus. No wall thickening or inflammatory changes are appreciated. Appendix is not identified. Vascular/Lymphatic: Aortic atherosclerosis. No enlarged abdominal or pelvic lymph nodes. Reproductive:  Status post hysterectomy. No adnexal masses. Other: Small amount of free fluid along the right pericolic gutter. No free air in the abdomen. Abdominal wall musculature appears intact. Edema in the subcutaneous fat. Musculoskeletal: Postoperative changes with posterior fixation and intervertebral prostheses from L1 to the sacrum. Normal  alignment of the spine. Degenerative changes. IMPRESSION: 1. Dilated and gas-filled portions of colon and small bowel with contrast transiting to the colon. Changes likely represent adynamic ileus. 2. Bilateral adrenal gland nodules, likely benign adenomas. No follow-up is indicated. 3. Fatty infiltration of the liver. 4. Atelectasis in the lung bases. 5. Aortic atherosclerosis. 6. Postoperative changes in the lumbar spine. Electronically Signed   By: Lucienne Capers M.D.   On: 03/07/2021 20:33   DG Abd 1 View  Result Date: 03/06/2021 CLINICAL DATA:  NG tube placement, abdominal distention EXAM: ABDOMEN - 1 VIEW COMPARISON:  KUB 03/04/2021, CT abdomen/pelvis 03/02/2021 FINDINGS: The enteric catheter tip and side hole project over the stomach. There are markedly gas distended loops of bowel throughout the imaged abdomen measuring up to approximately 6.9 cm. The appearance is overall similar to the prior study. There is no definite free intraperitoneal air, though evaluation is limited given supine technique. The lung bases are clear.  Spinal fusion hardware is again noted. IMPRESSION: 1. Enteric catheter tip and side hole project over the stomach. 2. Marked gaseous distention of bowel throughout the imaged abdomen is overall similar to the prior study. This may reflect ileus or obstruction. Electronically Signed   By: Valetta Mole M.D.   On: 03/06/2021 11:58   DG Abd Portable 1V  Result Date: 03/07/2021 CLINICAL DATA:  Ileus EXAM: PORTABLE ABDOMEN - 1 VIEW COMPARISON:  03/06/2021 FINDINGS: Lumbar fusion hardware again seen. Nasogastric tube is not significantly changed in position. Diffuse small bowel dilatation is mildly improved since prior exam. IMPRESSION: Mild interval improvement of diffuse small bowel dilatation, consistent with ileus. Electronically Signed   By: Miachel Roux M.D.   On: 03/07/2021 09:14    Anti-infectives: Anti-infectives (From admission, onward)    Start     Dose/Rate Route  Frequency Ordered Stop   03/04/21 0000  ciprofloxacin (CIPRO) 500 MG tablet        500 mg Oral 2 times daily 03/04/21 1609     03/01/21 2245  ciprofloxacin (CIPRO) tablet 500 mg        500 mg Oral 2 times daily 03/01/21 2159     02/27/21 2000  ceFAZolin (ANCEF) IVPB 2g/100 mL premix        2 g 200 mL/hr over 30 Minutes Intravenous Every 8 hours 02/27/21 1602 02/28/21 0356   02/27/21 0700  ceFAZolin (ANCEF) IVPB 2g/100 mL premix        2 g 200 mL/hr over 30 Minutes Intravenous On call to O.R. 02/27/21 0645 02/27/21 1240        Assessment/Plan Ileus  Hx of Anterior lateral interbody fusions L2-3 L3-4 posterior spinal fixation L1 L4 on 8/17 by Dr. Saintclair Halsted - post op ileus.  Unable to tell NGT output as it is not being recorded, but she is having bowel function. -CT with contrast in colon -DC NGT and give CLD today, hopefully she tolerates and can adv tomorrow to solid diet - Mobilize for bowel function.  - Keep K > 4 and Mg > 2 for bowel function. Replace K today as it was 3.3 yesterday.  Repeat BMET   FEN - CLD, IVF per primary  VTE - SCDs, okay for chemical  prophylaxis from a general surgery standpoint ID - On cipro per primary. None indicated from our standpoint   LOS: 9 days    Janice Pace , [facility] 03/08/2021, 8:51 AM Please see Amion for pager number during day hours 7:00am-4:30pm

## 2021-03-08 NOTE — Progress Notes (Signed)
Physical Therapy Treatment Patient Details Name: Janice Pace MRN: [id number] DOB: August 04, 1945 Today's Date: 03/08/2021    History of Present Illness Pt is a 75 y/o female presenting with radicular lumbar symptoms s/p elective L2-L4 ALIF on 02/27/2021. Post-surgical complication of CT scan of abdomen pelvis was consistent with ileus. PMHx significant for several back surgeries including previous L4-S1 fusion in 2013, CA, heart murmur and valve disorder, pre-diabetes.    PT Comments    Pt is making good, steady progress. She was able to ambulate up to ~200 ft with a RW and min guard-supervision today, and she reports walking several times earlier prior to PT session this date too. She displays anterior tibialis strength (R weaker) and general endurance deficits that impact her safety and place her at risk for falls. Educated pt on clearing feet with changes in surfaces to ensure pt safety at home. Will continue to follow acutely. Current recommendations remain appropriate.   Follow Up Recommendations  Home health PT;Supervision for mobility/OOB     Equipment Recommendations  Rolling walker with 5" wheels;3in1 (PT)    Recommendations for Other Services       Precautions / Restrictions Precautions Precautions: Back;Fall Precaution Booklet Issued: Yes (comment) Precaution Comments: Reviewed handout and pt was cued for precautions during functional mobility. Required Braces or Orthoses: Spinal Brace Spinal Brace: Lumbar corset;Applied in sitting position Restrictions Weight Bearing Restrictions: No    Mobility  Bed Mobility Overal bed mobility: Needs Assistance Bed Mobility: Rolling;Sidelying to Sit Rolling: Supervision Sidelying to sit: HOB elevated;Supervision       General bed mobility comments: Supervision for safety, but pt able to use rails and bed controls to transition to sit EOB while maintaining spinal precautions.    Transfers Overall transfer level: Needs  assistance Equipment used: Rolling walker (2 wheeled) Transfers: Sit to/from Stand Sit to Stand: Supervision         General transfer comment: Supervision for safety, no LOB.  Ambulation/Gait Ambulation/Gait assistance: Min guard;Supervision Gait Distance (Feet): 200 Feet Assistive device: Rolling walker (2 wheeled) Gait Pattern/deviations: Step-through pattern;Decreased stride length;Decreased dorsiflexion - right Gait velocity: Decreased Gait velocity interpretation: <1.31 ft/sec, indicative of household ambulator General Gait Details: Repeated cues to increase bil feet clearance, particularly on the R. Educated pt to ensure feet clearance with changes in surfaces and thresholds at home. No LOB, min guard-supervision for safety. Unable to increase speed this date.   Stairs             Wheelchair Mobility    Modified Rankin (Stroke Patients Only)       Balance Overall balance assessment: Needs assistance Sitting-balance support: No upper extremity supported;Feet supported Sitting balance-Leahy Scale: Fair Sitting balance - Comments: Static sitting EOB with supervision.   Standing balance support: Single extremity supported;No upper extremity supported;Bilateral upper extremity supported Standing balance-Leahy Scale: Fair Standing balance comment: Able to stand statically without UE support briefly, but UE support for mobility.                            Cognition Arousal/Alertness: Awake/alert Behavior During Therapy: WFL for tasks assessed/performed Overall Cognitive Status: Impaired/Different from baseline Area of Impairment: Memory                     Memory: Decreased short-term memory;Decreased recall of precautions         General Comments: Requires cues to maintain no twisting spinal precaution with mobility.  Exercises      General Comments        Pertinent Vitals/Pain Pain Assessment: Faces Faces Pain Scale: Hurts  a little bit Pain Location: Low back (incisional) Pain Descriptors / Indicators: Sore;Grimacing;Operative site guarding Pain Intervention(s): Limited activity within patient's tolerance;Monitored during session;Repositioned    Home Living                      Prior Function            PT Goals (current goals can now be found in the care plan section) Acute Rehab PT Goals Patient Stated Goal: to go home soon PT Goal Formulation: With patient/family Time For Goal Achievement: 03/21/21 Potential to Achieve Goals: Good Progress towards PT goals: Progressing toward goals    Frequency    Min 5X/week      PT Plan Current plan remains appropriate    Co-evaluation              AM-PAC PT "6 Clicks" Mobility   Outcome Measure  Help needed turning from your back to your side while in a flat bed without using bedrails?: A Little Help needed moving from lying on your back to sitting on the side of a flat bed without using bedrails?: A Little Help needed moving to and from a bed to a chair (including a wheelchair)?: A Little Help needed standing up from a chair using your arms (e.g., wheelchair or bedside chair)?: A Little Help needed to walk in hospital room?: A Little Help needed climbing 3-5 steps with a railing? : A Little 6 Click Score: 18    End of Session Equipment Utilized During Treatment: Back brace Activity Tolerance: Patient tolerated treatment well Patient left: with call bell/phone within reach;with family/visitor present;in chair   PT Visit Diagnosis: Unsteadiness on feet (R26.81);Pain;Other abnormalities of gait and mobility (R26.89);Difficulty in walking, not elsewhere classified (R26.2) Pain - part of body:  (back)     Time: 1656-1710 PT Time Calculation (min) (ACUTE ONLY): 14 min  Charges:  $Gait Training: 8-22 mins                     Moishe Spice, PT, DPT Acute Rehabilitation Services  Pager: [phone] Office:  [place] 03/08/2021, 6:03 PM

## 2021-03-08 NOTE — Plan of Care (Signed)
  Problem: Safety: Goal: Ability to remain free from injury will improve 03/08/2021 1411 by Katherina Right, RN Outcome: Progressing 03/08/2021 1411 by Katherina Right, RN Outcome: Progressing   Problem: Education: Goal: Ability to verbalize activity precautions or restrictions will improve 03/08/2021 1411 by Katherina Right, RN Outcome: Progressing 03/08/2021 1411 by Katherina Right, RN Outcome: Progressing Goal: Knowledge of the prescribed therapeutic regimen will improve 03/08/2021 1411 by Katherina Right, RN Outcome: Progressing 03/08/2021 1411 by Katherina Right, RN Outcome: Progressing Goal: Understanding of discharge needs will improve 03/08/2021 1411 by Katherina Right, RN Outcome: Progressing 03/08/2021 1411 by Katherina Right, RN Outcome: Progressing   Problem: Activity: Goal: Ability to avoid complications of mobility impairment will improve 03/08/2021 1411 by Katherina Right, RN Outcome: Progressing 03/08/2021 1411 by Katherina Right, RN Outcome: Progressing Goal: Ability to tolerate increased activity will improve 03/08/2021 1411 by Katherina Right, RN Outcome: Progressing 03/08/2021 1411 by Katherina Right, RN Outcome: Progressing Goal: Will remain free from falls 03/08/2021 1411 by Katherina Right, RN Outcome: Progressing 03/08/2021 1411 by Katherina Right, RN Outcome: Progressing   Problem: Bowel/Gastric: Goal: Gastrointestinal status for postoperative course will improve 03/08/2021 1411 by Katherina Right, RN Outcome: Progressing 03/08/2021 1411 by Katherina Right, RN Outcome: Progressing   Problem: Clinical Measurements: Goal: Ability to maintain clinical measurements within normal limits will improve 03/08/2021 1411 by Katherina Right, RN Outcome: Progressing 03/08/2021 1411 by Katherina Right, RN Outcome: Progressing Goal: Postoperative complications will be avoided or minimized 03/08/2021 1411 by Katherina Right, RN Outcome: Progressing 03/08/2021 1411 by Katherina Right, RN Outcome:  Progressing Goal: Diagnostic test results will improve 03/08/2021 1411 by Katherina Right, RN Outcome: Progressing 03/08/2021 1411 by Katherina Right, RN Outcome: Progressing   Problem: Pain Management: Goal: Pain level will decrease 03/08/2021 1411 by Katherina Right, RN Outcome: Progressing 03/08/2021 1411 by Katherina Right, RN Outcome: Progressing   Problem: Skin Integrity: Goal: Will show signs of wound healing 03/08/2021 1411 by Katherina Right, RN Outcome: Progressing 03/08/2021 1411 by Katherina Right, RN Outcome: Progressing   Problem: Health Behavior/Discharge Planning: Goal: Identification of resources available to assist in meeting health care needs will improve 03/08/2021 1411 by Katherina Right, RN Outcome: Progressing 03/08/2021 1411 by Katherina Right, RN Outcome: Progressing   Problem: Bladder/Genitourinary: Goal: Urinary functional status for postoperative course will improve 03/08/2021 1411 by Katherina Right, RN Outcome: Progressing 03/08/2021 1411 by Katherina Right, RN Outcome: Progressing

## 2021-03-09 NOTE — Progress Notes (Signed)
10 Days Post-Op  Subjective: CC: Patient reports abdominal distension and pain have resolved. No longer feels bloated. Tolerating cld without n/v. Passing flatus. 2 semi-solid bm's yesterday and 2 already this am.   Objective: Vital signs in last 24 hours: Temp:  [98.3 F (36.8 C)-99 F (37.2 C)] 99 F (37.2 C) (08/27 0750) Pulse Rate:  [92-98] 95 (08/27 0750) Resp:  [13-19] 19 (08/27 0750) BP: (141-155)/(63-76) 155/69 (08/27 0750) SpO2:  [92 %-96 %] 93 % (08/27 0750) Last BM Date: 03/09/21  Intake/Output from previous day: 08/26 0701 - 08/27 0700 In: 5007.8 [P.O.:1000; I.V.:3607.8; IV Piggyback:400] Out: -  Intake/Output this shift: No intake/output data recorded.  PE: Gen:  Alert, NAD, pleasant Pulm: normal rate and effort Abd: Soft, protuberant w/ much improved distension. NT, +BS.  Psych: A&Ox3   Lab Results:  No results for input(s): WBC, HGB, HCT, PLT in the last 72 hours. BMET Recent Labs    03/07/21 0305 03/08/21 0818  NA 137 134*  K 3.3* 3.1*  CL 107 103  CO2 23 23  GLUCOSE 108* 91  BUN 8 6*  CREATININE 0.59 0.60  CALCIUM 8.0* 7.8*   PT/INR No results for input(s): LABPROT, INR in the last 72 hours. CMP     Component Value Date/Time   NA 134 (L) 03/08/2021 0818   NA 142 04/24/2020 1600   K 3.1 (L) 03/08/2021 0818   CL 103 03/08/2021 0818   CO2 23 03/08/2021 0818   GLUCOSE 91 03/08/2021 0818   BUN 6 (L) 03/08/2021 0818   BUN 12 04/24/2020 1600   CREATININE 0.60 03/08/2021 0818   CALCIUM 7.8 (L) 03/08/2021 0818   PROT 4.9 (L) 03/05/2021 0739   PROT 6.6 04/24/2020 1600   ALBUMIN 2.4 (L) 03/05/2021 0739   ALBUMIN 4.5 04/24/2020 1600   AST 24 03/05/2021 0739   ALT 29 03/05/2021 0739   ALKPHOS 49 03/05/2021 0739   BILITOT 0.6 03/05/2021 0739   BILITOT 0.6 04/24/2020 1600   GFRNONAA >60 03/08/2021 0818   GFRAA 100 04/24/2020 1600   Lipase  No results found for: LIPASE  Studies/Results: CT ABDOMEN PELVIS WO CONTRAST  Result Date:  03/07/2021 CLINICAL DATA:  Abdominal distention. Suspect postoperative ileus after back surgery. EXAM: CT ABDOMEN AND PELVIS WITHOUT CONTRAST TECHNIQUE: Multidetector CT imaging of the abdomen and pelvis was performed following the standard protocol without IV contrast. COMPARISON:  03/02/2021 FINDINGS: Lower chest: Linear atelectasis in the lung bases. Calcified granuloma on the right. Hepatobiliary: Diffuse fatty infiltration of the liver. No focal lesions. Gallbladder and bile ducts are unremarkable. Pancreas: Unremarkable. No pancreatic ductal dilatation or surrounding inflammatory changes. Spleen: Normal in size without focal abnormality. Adrenals/Urinary Tract: Left adrenal gland nodule measuring 1.6 cm diameter. Density measurements of 7 Hounsfield units consistent with benign fat containing adenoma. Right adrenal gland nodule measuring 1.7 cm diameter. Density measurements of 14 Hounsfield units likely indicate fat containing adenoma. No change in appearance since prior study. Kidneys are symmetrical in size. No renal stones. No hydronephrosis or hydroureter. Bladder is unremarkable. Stomach/Bowel: There is an enteric tube with tip terminating in the body of the stomach. Dilated gas-filled small bowel and dilated transverse colon with gas. Contrast material flows through to the cecum suggesting no evidence of bowel obstruction. Changes likely represent ileus. No wall thickening or inflammatory changes are appreciated. Appendix is not identified. Vascular/Lymphatic: Aortic atherosclerosis. No enlarged abdominal or pelvic lymph nodes. Reproductive: Status post hysterectomy. No adnexal masses. Other: Small amount of free  fluid along the right pericolic gutter. No free air in the abdomen. Abdominal wall musculature appears intact. Edema in the subcutaneous fat. Musculoskeletal: Postoperative changes with posterior fixation and intervertebral prostheses from L1 to the sacrum. Normal alignment of the spine.  Degenerative changes. IMPRESSION: 1. Dilated and gas-filled portions of colon and small bowel with contrast transiting to the colon. Changes likely represent adynamic ileus. 2. Bilateral adrenal gland nodules, likely benign adenomas. No follow-up is indicated. 3. Fatty infiltration of the liver. 4. Atelectasis in the lung bases. 5. Aortic atherosclerosis. 6. Postoperative changes in the lumbar spine. Electronically Signed   By: Lucienne Capers M.D.   On: 03/07/2021 20:33    Anti-infectives: Anti-infectives (From admission, onward)    Start     Dose/Rate Route Frequency Ordered Stop   03/04/21 0000  ciprofloxacin (CIPRO) 500 MG tablet  Status:  Discontinued        500 mg Oral 2 times daily 03/04/21 1609 03/08/21    03/01/21 2245  ciprofloxacin (CIPRO) tablet 500 mg  Status:  Discontinued        500 mg Oral 2 times daily 03/01/21 2159 03/08/21 1211   02/27/21 2000  ceFAZolin (ANCEF) IVPB 2g/100 mL premix        2 g 200 mL/hr over 30 Minutes Intravenous Every 8 hours 02/27/21 1602 02/28/21 0356   02/27/21 0700  ceFAZolin (ANCEF) IVPB 2g/100 mL premix        2 g 200 mL/hr over 30 Minutes Intravenous On call to O.R. 02/27/21 0645 02/27/21 1240        Assessment/Plan Ileus  Hx of Anterior lateral interbody fusions L2-3 L3-4 posterior spinal fixation L1 L4 on 8/17 by Dr. Saintclair Halsted - Post op ileus. CT with contrast in colon - Symptomatically improved - tolerating cld without n/v, no abdominal pain, NT on exam and had robf. Adv to FLD. Can adv as tolerated from there. Our team will be available as needed moving forward.    FEN - FLD, IVF per primary (recommend K >4 and Mg > 2 for bowel function) VTE - SCDs, okay for chemical prophylaxis from a general surgery standpoint ID - None currently    LOS: 10 days    Jillyn Ledger , [facility] 03/09/2021, 11:16 AM Please see Amion for pager number during day hours 7:00am-4:30pm

## 2021-03-09 NOTE — Progress Notes (Signed)
Pt discharged home. Pt states that she has all needed equipment already. Pt and her husband educated on discharge information with all questions answered prior to d/c. This RN spoke to Dr. Grandville Silos to confirm that pt may go home on a regular diet. Pt wearing brace, all belongings gathered, and pt taken down to personal vehicle by this RN.   Justice Rocher, RN

## 2021-03-13 DIAGNOSIS — Z9181 History of falling: Secondary | ICD-10-CM | POA: Diagnosis not present

## 2021-03-13 DIAGNOSIS — Z87891 Personal history of nicotine dependence: Secondary | ICD-10-CM | POA: Diagnosis not present

## 2021-03-13 DIAGNOSIS — I700 Atherosclerosis of aorta: Secondary | ICD-10-CM | POA: Diagnosis not present

## 2021-03-13 DIAGNOSIS — R7303 Prediabetes: Secondary | ICD-10-CM | POA: Diagnosis not present

## 2021-03-13 DIAGNOSIS — K219 Gastro-esophageal reflux disease without esophagitis: Secondary | ICD-10-CM | POA: Diagnosis not present

## 2021-03-13 DIAGNOSIS — Z4789 Encounter for other orthopedic aftercare: Secondary | ICD-10-CM | POA: Diagnosis not present

## 2021-03-13 DIAGNOSIS — Z7982 Long term (current) use of aspirin: Secondary | ICD-10-CM | POA: Diagnosis not present

## 2021-03-13 DIAGNOSIS — G4700 Insomnia, unspecified: Secondary | ICD-10-CM | POA: Diagnosis not present

## 2021-03-13 DIAGNOSIS — M1712 Unilateral primary osteoarthritis, left knee: Secondary | ICD-10-CM | POA: Diagnosis not present

## 2021-03-13 DIAGNOSIS — K529 Noninfective gastroenteritis and colitis, unspecified: Secondary | ICD-10-CM | POA: Diagnosis not present

## 2021-03-13 DIAGNOSIS — G8929 Other chronic pain: Secondary | ICD-10-CM | POA: Diagnosis not present

## 2021-03-13 DIAGNOSIS — E559 Vitamin D deficiency, unspecified: Secondary | ICD-10-CM | POA: Diagnosis not present

## 2021-03-13 DIAGNOSIS — M48061 Spinal stenosis, lumbar region without neurogenic claudication: Secondary | ICD-10-CM | POA: Diagnosis not present

## 2021-03-13 DIAGNOSIS — Z85831 Personal history of malignant neoplasm of soft tissue: Secondary | ICD-10-CM | POA: Diagnosis not present

## 2021-03-13 DIAGNOSIS — M5116 Intervertebral disc disorders with radiculopathy, lumbar region: Secondary | ICD-10-CM | POA: Diagnosis not present

## 2021-03-13 DIAGNOSIS — Z86010 Personal history of colonic polyps: Secondary | ICD-10-CM | POA: Diagnosis not present

## 2021-03-13 DIAGNOSIS — E119 Type 2 diabetes mellitus without complications: Secondary | ICD-10-CM | POA: Diagnosis not present

## 2021-03-13 DIAGNOSIS — G5602 Carpal tunnel syndrome, left upper limb: Secondary | ICD-10-CM | POA: Diagnosis not present

## 2021-03-13 DIAGNOSIS — E7849 Other hyperlipidemia: Secondary | ICD-10-CM | POA: Diagnosis not present

## 2021-03-19 DIAGNOSIS — K529 Noninfective gastroenteritis and colitis, unspecified: Secondary | ICD-10-CM | POA: Diagnosis not present

## 2021-03-19 DIAGNOSIS — E119 Type 2 diabetes mellitus without complications: Secondary | ICD-10-CM | POA: Diagnosis not present

## 2021-03-19 DIAGNOSIS — G8929 Other chronic pain: Secondary | ICD-10-CM | POA: Diagnosis not present

## 2021-03-19 DIAGNOSIS — K219 Gastro-esophageal reflux disease without esophagitis: Secondary | ICD-10-CM | POA: Diagnosis not present

## 2021-03-19 DIAGNOSIS — M1712 Unilateral primary osteoarthritis, left knee: Secondary | ICD-10-CM | POA: Diagnosis not present

## 2021-03-19 DIAGNOSIS — Z85831 Personal history of malignant neoplasm of soft tissue: Secondary | ICD-10-CM | POA: Diagnosis not present

## 2021-03-19 DIAGNOSIS — I700 Atherosclerosis of aorta: Secondary | ICD-10-CM | POA: Diagnosis not present

## 2021-03-19 DIAGNOSIS — E7849 Other hyperlipidemia: Secondary | ICD-10-CM | POA: Diagnosis not present

## 2021-03-19 DIAGNOSIS — Z87891 Personal history of nicotine dependence: Secondary | ICD-10-CM | POA: Diagnosis not present

## 2021-03-19 DIAGNOSIS — Z4789 Encounter for other orthopedic aftercare: Secondary | ICD-10-CM | POA: Diagnosis not present

## 2021-03-19 DIAGNOSIS — Z86010 Personal history of colonic polyps: Secondary | ICD-10-CM | POA: Diagnosis not present

## 2021-03-19 DIAGNOSIS — G5602 Carpal tunnel syndrome, left upper limb: Secondary | ICD-10-CM | POA: Diagnosis not present

## 2021-03-19 DIAGNOSIS — R7303 Prediabetes: Secondary | ICD-10-CM | POA: Diagnosis not present

## 2021-03-19 DIAGNOSIS — M5116 Intervertebral disc disorders with radiculopathy, lumbar region: Secondary | ICD-10-CM | POA: Diagnosis not present

## 2021-03-19 DIAGNOSIS — E559 Vitamin D deficiency, unspecified: Secondary | ICD-10-CM | POA: Diagnosis not present

## 2021-03-19 DIAGNOSIS — M48061 Spinal stenosis, lumbar region without neurogenic claudication: Secondary | ICD-10-CM | POA: Diagnosis not present

## 2021-03-19 DIAGNOSIS — G4700 Insomnia, unspecified: Secondary | ICD-10-CM | POA: Diagnosis not present

## 2021-03-19 DIAGNOSIS — Z7982 Long term (current) use of aspirin: Secondary | ICD-10-CM | POA: Diagnosis not present

## 2021-03-19 DIAGNOSIS — Z9181 History of falling: Secondary | ICD-10-CM | POA: Diagnosis not present

## 2021-03-21 DIAGNOSIS — Z86010 Personal history of colonic polyps: Secondary | ICD-10-CM | POA: Diagnosis not present

## 2021-03-21 DIAGNOSIS — E7849 Other hyperlipidemia: Secondary | ICD-10-CM | POA: Diagnosis not present

## 2021-03-21 DIAGNOSIS — E559 Vitamin D deficiency, unspecified: Secondary | ICD-10-CM | POA: Diagnosis not present

## 2021-03-21 DIAGNOSIS — M5116 Intervertebral disc disorders with radiculopathy, lumbar region: Secondary | ICD-10-CM | POA: Diagnosis not present

## 2021-03-21 DIAGNOSIS — G5602 Carpal tunnel syndrome, left upper limb: Secondary | ICD-10-CM | POA: Diagnosis not present

## 2021-03-21 DIAGNOSIS — M1712 Unilateral primary osteoarthritis, left knee: Secondary | ICD-10-CM | POA: Diagnosis not present

## 2021-03-21 DIAGNOSIS — I700 Atherosclerosis of aorta: Secondary | ICD-10-CM | POA: Diagnosis not present

## 2021-03-21 DIAGNOSIS — Z9181 History of falling: Secondary | ICD-10-CM | POA: Diagnosis not present

## 2021-03-21 DIAGNOSIS — Z85831 Personal history of malignant neoplasm of soft tissue: Secondary | ICD-10-CM | POA: Diagnosis not present

## 2021-03-21 DIAGNOSIS — G8929 Other chronic pain: Secondary | ICD-10-CM | POA: Diagnosis not present

## 2021-03-21 DIAGNOSIS — Z7982 Long term (current) use of aspirin: Secondary | ICD-10-CM | POA: Diagnosis not present

## 2021-03-21 DIAGNOSIS — Z87891 Personal history of nicotine dependence: Secondary | ICD-10-CM | POA: Diagnosis not present

## 2021-03-21 DIAGNOSIS — R7303 Prediabetes: Secondary | ICD-10-CM | POA: Diagnosis not present

## 2021-03-21 DIAGNOSIS — K219 Gastro-esophageal reflux disease without esophagitis: Secondary | ICD-10-CM | POA: Diagnosis not present

## 2021-03-21 DIAGNOSIS — G4700 Insomnia, unspecified: Secondary | ICD-10-CM | POA: Diagnosis not present

## 2021-03-21 DIAGNOSIS — Z4789 Encounter for other orthopedic aftercare: Secondary | ICD-10-CM | POA: Diagnosis not present

## 2021-03-21 DIAGNOSIS — M48061 Spinal stenosis, lumbar region without neurogenic claudication: Secondary | ICD-10-CM | POA: Diagnosis not present

## 2021-03-21 DIAGNOSIS — E119 Type 2 diabetes mellitus without complications: Secondary | ICD-10-CM | POA: Diagnosis not present

## 2021-03-21 DIAGNOSIS — K529 Noninfective gastroenteritis and colitis, unspecified: Secondary | ICD-10-CM | POA: Diagnosis not present

## 2021-03-22 DIAGNOSIS — I700 Atherosclerosis of aorta: Secondary | ICD-10-CM | POA: Diagnosis not present

## 2021-03-22 DIAGNOSIS — Z85831 Personal history of malignant neoplasm of soft tissue: Secondary | ICD-10-CM | POA: Diagnosis not present

## 2021-03-22 DIAGNOSIS — E559 Vitamin D deficiency, unspecified: Secondary | ICD-10-CM | POA: Diagnosis not present

## 2021-03-22 DIAGNOSIS — M5116 Intervertebral disc disorders with radiculopathy, lumbar region: Secondary | ICD-10-CM | POA: Diagnosis not present

## 2021-03-22 DIAGNOSIS — G4700 Insomnia, unspecified: Secondary | ICD-10-CM | POA: Diagnosis not present

## 2021-03-22 DIAGNOSIS — G8929 Other chronic pain: Secondary | ICD-10-CM | POA: Diagnosis not present

## 2021-03-22 DIAGNOSIS — Z87891 Personal history of nicotine dependence: Secondary | ICD-10-CM | POA: Diagnosis not present

## 2021-03-22 DIAGNOSIS — Z86010 Personal history of colonic polyps: Secondary | ICD-10-CM | POA: Diagnosis not present

## 2021-03-22 DIAGNOSIS — M1712 Unilateral primary osteoarthritis, left knee: Secondary | ICD-10-CM | POA: Diagnosis not present

## 2021-03-22 DIAGNOSIS — G5602 Carpal tunnel syndrome, left upper limb: Secondary | ICD-10-CM | POA: Diagnosis not present

## 2021-03-22 DIAGNOSIS — E119 Type 2 diabetes mellitus without complications: Secondary | ICD-10-CM | POA: Diagnosis not present

## 2021-03-22 DIAGNOSIS — Z4789 Encounter for other orthopedic aftercare: Secondary | ICD-10-CM | POA: Diagnosis not present

## 2021-03-22 DIAGNOSIS — K529 Noninfective gastroenteritis and colitis, unspecified: Secondary | ICD-10-CM | POA: Diagnosis not present

## 2021-03-22 DIAGNOSIS — K219 Gastro-esophageal reflux disease without esophagitis: Secondary | ICD-10-CM | POA: Diagnosis not present

## 2021-03-22 DIAGNOSIS — R7303 Prediabetes: Secondary | ICD-10-CM | POA: Diagnosis not present

## 2021-03-22 DIAGNOSIS — E7849 Other hyperlipidemia: Secondary | ICD-10-CM | POA: Diagnosis not present

## 2021-03-22 DIAGNOSIS — M48061 Spinal stenosis, lumbar region without neurogenic claudication: Secondary | ICD-10-CM | POA: Diagnosis not present

## 2021-03-22 DIAGNOSIS — Z9181 History of falling: Secondary | ICD-10-CM | POA: Diagnosis not present

## 2021-03-22 DIAGNOSIS — Z7982 Long term (current) use of aspirin: Secondary | ICD-10-CM | POA: Diagnosis not present

## 2021-03-26 DIAGNOSIS — M1712 Unilateral primary osteoarthritis, left knee: Secondary | ICD-10-CM | POA: Diagnosis not present

## 2021-03-26 DIAGNOSIS — G5602 Carpal tunnel syndrome, left upper limb: Secondary | ICD-10-CM | POA: Diagnosis not present

## 2021-03-26 DIAGNOSIS — M48061 Spinal stenosis, lumbar region without neurogenic claudication: Secondary | ICD-10-CM | POA: Diagnosis not present

## 2021-03-26 DIAGNOSIS — R7303 Prediabetes: Secondary | ICD-10-CM | POA: Diagnosis not present

## 2021-03-26 DIAGNOSIS — E559 Vitamin D deficiency, unspecified: Secondary | ICD-10-CM | POA: Diagnosis not present

## 2021-03-26 DIAGNOSIS — E119 Type 2 diabetes mellitus without complications: Secondary | ICD-10-CM | POA: Diagnosis not present

## 2021-03-26 DIAGNOSIS — I700 Atherosclerosis of aorta: Secondary | ICD-10-CM | POA: Diagnosis not present

## 2021-03-26 DIAGNOSIS — K529 Noninfective gastroenteritis and colitis, unspecified: Secondary | ICD-10-CM | POA: Diagnosis not present

## 2021-03-26 DIAGNOSIS — G4700 Insomnia, unspecified: Secondary | ICD-10-CM | POA: Diagnosis not present

## 2021-03-26 DIAGNOSIS — Z86010 Personal history of colonic polyps: Secondary | ICD-10-CM | POA: Diagnosis not present

## 2021-03-26 DIAGNOSIS — G8929 Other chronic pain: Secondary | ICD-10-CM | POA: Diagnosis not present

## 2021-03-26 DIAGNOSIS — M5116 Intervertebral disc disorders with radiculopathy, lumbar region: Secondary | ICD-10-CM | POA: Diagnosis not present

## 2021-03-26 DIAGNOSIS — Z87891 Personal history of nicotine dependence: Secondary | ICD-10-CM | POA: Diagnosis not present

## 2021-03-26 DIAGNOSIS — E7849 Other hyperlipidemia: Secondary | ICD-10-CM | POA: Diagnosis not present

## 2021-03-26 DIAGNOSIS — Z4789 Encounter for other orthopedic aftercare: Secondary | ICD-10-CM | POA: Diagnosis not present

## 2021-03-26 DIAGNOSIS — Z85831 Personal history of malignant neoplasm of soft tissue: Secondary | ICD-10-CM | POA: Diagnosis not present

## 2021-03-26 DIAGNOSIS — Z7982 Long term (current) use of aspirin: Secondary | ICD-10-CM | POA: Diagnosis not present

## 2021-03-26 DIAGNOSIS — Z9181 History of falling: Secondary | ICD-10-CM | POA: Diagnosis not present

## 2021-03-26 DIAGNOSIS — K219 Gastro-esophageal reflux disease without esophagitis: Secondary | ICD-10-CM | POA: Diagnosis not present

## 2021-03-28 DIAGNOSIS — M5116 Intervertebral disc disorders with radiculopathy, lumbar region: Secondary | ICD-10-CM | POA: Diagnosis not present

## 2021-03-28 DIAGNOSIS — Z4789 Encounter for other orthopedic aftercare: Secondary | ICD-10-CM | POA: Diagnosis not present

## 2021-03-28 DIAGNOSIS — G5602 Carpal tunnel syndrome, left upper limb: Secondary | ICD-10-CM | POA: Diagnosis not present

## 2021-03-28 DIAGNOSIS — M1712 Unilateral primary osteoarthritis, left knee: Secondary | ICD-10-CM | POA: Diagnosis not present

## 2021-03-28 DIAGNOSIS — Z7982 Long term (current) use of aspirin: Secondary | ICD-10-CM | POA: Diagnosis not present

## 2021-03-28 DIAGNOSIS — G4700 Insomnia, unspecified: Secondary | ICD-10-CM | POA: Diagnosis not present

## 2021-03-28 DIAGNOSIS — Z9181 History of falling: Secondary | ICD-10-CM | POA: Diagnosis not present

## 2021-03-28 DIAGNOSIS — Z86010 Personal history of colonic polyps: Secondary | ICD-10-CM | POA: Diagnosis not present

## 2021-03-28 DIAGNOSIS — I700 Atherosclerosis of aorta: Secondary | ICD-10-CM | POA: Diagnosis not present

## 2021-03-28 DIAGNOSIS — G8929 Other chronic pain: Secondary | ICD-10-CM | POA: Diagnosis not present

## 2021-03-28 DIAGNOSIS — R7303 Prediabetes: Secondary | ICD-10-CM | POA: Diagnosis not present

## 2021-03-28 DIAGNOSIS — K529 Noninfective gastroenteritis and colitis, unspecified: Secondary | ICD-10-CM | POA: Diagnosis not present

## 2021-03-28 DIAGNOSIS — Z85831 Personal history of malignant neoplasm of soft tissue: Secondary | ICD-10-CM | POA: Diagnosis not present

## 2021-03-28 DIAGNOSIS — E7849 Other hyperlipidemia: Secondary | ICD-10-CM | POA: Diagnosis not present

## 2021-03-28 DIAGNOSIS — Z87891 Personal history of nicotine dependence: Secondary | ICD-10-CM | POA: Diagnosis not present

## 2021-03-28 DIAGNOSIS — K219 Gastro-esophageal reflux disease without esophagitis: Secondary | ICD-10-CM | POA: Diagnosis not present

## 2021-03-28 DIAGNOSIS — E559 Vitamin D deficiency, unspecified: Secondary | ICD-10-CM | POA: Diagnosis not present

## 2021-03-28 DIAGNOSIS — M48061 Spinal stenosis, lumbar region without neurogenic claudication: Secondary | ICD-10-CM | POA: Diagnosis not present

## 2021-03-28 DIAGNOSIS — E119 Type 2 diabetes mellitus without complications: Secondary | ICD-10-CM | POA: Diagnosis not present

## 2021-04-01 ENCOUNTER — Telehealth: Payer: Self-pay | Admitting: Cardiology

## 2021-04-01 ENCOUNTER — Telehealth: Payer: Self-pay

## 2021-04-01 DIAGNOSIS — K529 Noninfective gastroenteritis and colitis, unspecified: Secondary | ICD-10-CM | POA: Diagnosis not present

## 2021-04-01 DIAGNOSIS — G8929 Other chronic pain: Secondary | ICD-10-CM | POA: Diagnosis not present

## 2021-04-01 DIAGNOSIS — Z4789 Encounter for other orthopedic aftercare: Secondary | ICD-10-CM | POA: Diagnosis not present

## 2021-04-01 DIAGNOSIS — G4700 Insomnia, unspecified: Secondary | ICD-10-CM | POA: Diagnosis not present

## 2021-04-01 DIAGNOSIS — M48061 Spinal stenosis, lumbar region without neurogenic claudication: Secondary | ICD-10-CM | POA: Diagnosis not present

## 2021-04-01 DIAGNOSIS — K219 Gastro-esophageal reflux disease without esophagitis: Secondary | ICD-10-CM | POA: Diagnosis not present

## 2021-04-01 DIAGNOSIS — R7303 Prediabetes: Secondary | ICD-10-CM | POA: Diagnosis not present

## 2021-04-01 DIAGNOSIS — Z87891 Personal history of nicotine dependence: Secondary | ICD-10-CM | POA: Diagnosis not present

## 2021-04-01 DIAGNOSIS — E7849 Other hyperlipidemia: Secondary | ICD-10-CM | POA: Diagnosis not present

## 2021-04-01 DIAGNOSIS — E119 Type 2 diabetes mellitus without complications: Secondary | ICD-10-CM | POA: Diagnosis not present

## 2021-04-01 DIAGNOSIS — G5602 Carpal tunnel syndrome, left upper limb: Secondary | ICD-10-CM | POA: Diagnosis not present

## 2021-04-01 DIAGNOSIS — I700 Atherosclerosis of aorta: Secondary | ICD-10-CM | POA: Diagnosis not present

## 2021-04-01 DIAGNOSIS — Z85831 Personal history of malignant neoplasm of soft tissue: Secondary | ICD-10-CM | POA: Diagnosis not present

## 2021-04-01 DIAGNOSIS — Z86010 Personal history of colonic polyps: Secondary | ICD-10-CM | POA: Diagnosis not present

## 2021-04-01 DIAGNOSIS — M5116 Intervertebral disc disorders with radiculopathy, lumbar region: Secondary | ICD-10-CM | POA: Diagnosis not present

## 2021-04-01 DIAGNOSIS — Z9181 History of falling: Secondary | ICD-10-CM | POA: Diagnosis not present

## 2021-04-01 DIAGNOSIS — M1712 Unilateral primary osteoarthritis, left knee: Secondary | ICD-10-CM | POA: Diagnosis not present

## 2021-04-01 DIAGNOSIS — Z7982 Long term (current) use of aspirin: Secondary | ICD-10-CM | POA: Diagnosis not present

## 2021-04-01 DIAGNOSIS — E559 Vitamin D deficiency, unspecified: Secondary | ICD-10-CM | POA: Diagnosis not present

## 2021-04-01 NOTE — Telephone Encounter (Signed)
Pt called and stated that she had back surgery a few weeks ago and Is now doing physical therapy. She said that her pulse has been over 100 and she has been getting SOB very easily. Her physical therapist recommended her to call and see if she can come in sooner or if there is anything we can do. Please advise. Physical therapist Abagail Kitchens phone number : [phone].

## 2021-04-01 NOTE — Telephone Encounter (Signed)
Please set her up. She is seeing me on 28th of this month

## 2021-04-02 NOTE — Telephone Encounter (Signed)
Can you call this pt and have her appt moved up please.

## 2021-04-04 ENCOUNTER — Other Ambulatory Visit: Payer: Self-pay

## 2021-04-04 ENCOUNTER — Ambulatory Visit
Admission: RE | Admit: 2021-04-04 | Discharge: 2021-04-04 | Disposition: A | Discharge status: Home / Self Care | Payer: Medicare Other | Source: Ambulatory Visit | Attending: Cardiology | Admitting: Cardiology

## 2021-04-04 ENCOUNTER — Encounter: Payer: Self-pay | Admitting: Cardiology

## 2021-04-04 ENCOUNTER — Ambulatory Visit: Payer: Medicare Other | Admitting: Cardiology

## 2021-04-04 VITALS — BP 130/82 | HR 116 | Temp 97.9°F | Resp 17 | Ht 62.0 in | Wt 188.0 lb

## 2021-04-04 DIAGNOSIS — Z9889 Other specified postprocedural states: Secondary | ICD-10-CM

## 2021-04-04 DIAGNOSIS — E119 Type 2 diabetes mellitus without complications: Secondary | ICD-10-CM | POA: Diagnosis not present

## 2021-04-04 DIAGNOSIS — R0600 Dyspnea, unspecified: Secondary | ICD-10-CM

## 2021-04-04 DIAGNOSIS — I700 Atherosclerosis of aorta: Secondary | ICD-10-CM | POA: Diagnosis not present

## 2021-04-04 DIAGNOSIS — G4700 Insomnia, unspecified: Secondary | ICD-10-CM | POA: Diagnosis not present

## 2021-04-04 DIAGNOSIS — K219 Gastro-esophageal reflux disease without esophagitis: Secondary | ICD-10-CM | POA: Diagnosis not present

## 2021-04-04 DIAGNOSIS — K529 Noninfective gastroenteritis and colitis, unspecified: Secondary | ICD-10-CM | POA: Diagnosis not present

## 2021-04-04 DIAGNOSIS — Z86010 Personal history of colonic polyps: Secondary | ICD-10-CM | POA: Diagnosis not present

## 2021-04-04 DIAGNOSIS — Z87891 Personal history of nicotine dependence: Secondary | ICD-10-CM | POA: Diagnosis not present

## 2021-04-04 DIAGNOSIS — R0609 Other forms of dyspnea: Secondary | ICD-10-CM | POA: Diagnosis not present

## 2021-04-04 DIAGNOSIS — E7849 Other hyperlipidemia: Secondary | ICD-10-CM | POA: Diagnosis not present

## 2021-04-04 DIAGNOSIS — R002 Palpitations: Secondary | ICD-10-CM | POA: Diagnosis not present

## 2021-04-04 DIAGNOSIS — Z9181 History of falling: Secondary | ICD-10-CM | POA: Diagnosis not present

## 2021-04-04 DIAGNOSIS — R000 Tachycardia, unspecified: Secondary | ICD-10-CM

## 2021-04-04 DIAGNOSIS — G5602 Carpal tunnel syndrome, left upper limb: Secondary | ICD-10-CM | POA: Diagnosis not present

## 2021-04-04 DIAGNOSIS — Z4789 Encounter for other orthopedic aftercare: Secondary | ICD-10-CM | POA: Diagnosis not present

## 2021-04-04 DIAGNOSIS — Z7982 Long term (current) use of aspirin: Secondary | ICD-10-CM | POA: Diagnosis not present

## 2021-04-04 DIAGNOSIS — M5116 Intervertebral disc disorders with radiculopathy, lumbar region: Secondary | ICD-10-CM | POA: Diagnosis not present

## 2021-04-04 DIAGNOSIS — R7303 Prediabetes: Secondary | ICD-10-CM | POA: Diagnosis not present

## 2021-04-04 DIAGNOSIS — G8929 Other chronic pain: Secondary | ICD-10-CM | POA: Diagnosis not present

## 2021-04-04 DIAGNOSIS — M1712 Unilateral primary osteoarthritis, left knee: Secondary | ICD-10-CM | POA: Diagnosis not present

## 2021-04-04 DIAGNOSIS — E559 Vitamin D deficiency, unspecified: Secondary | ICD-10-CM | POA: Diagnosis not present

## 2021-04-04 DIAGNOSIS — M48061 Spinal stenosis, lumbar region without neurogenic claudication: Secondary | ICD-10-CM | POA: Diagnosis not present

## 2021-04-04 DIAGNOSIS — Z85831 Personal history of malignant neoplasm of soft tissue: Secondary | ICD-10-CM | POA: Diagnosis not present

## 2021-04-04 NOTE — Progress Notes (Signed)
Primary Physician/Referring:  Leeroy Cha, MD  Patient ID: Janice Pace, female    DOB: 1945-12-06, 75 y.o.   MRN: [id number]  Chief Complaint  Patient presents with   New Patient (Initial Visit)   high pulse rate   HPI:    Janice Pace  is a 75 y.o. Patient with hyperlipidemia, chronic back pain, underwent anterior lateral interbody fusion of L2-3 and 3 4 with posterior spinal fixation on 02/27/2021, hospital stay was complicated by GI bleed from colitis, she also had UTI and tachycardia related to above-mentioned conditions.  Patient made an appointment to see [place] as her heart rate has been >100 and was recommended by home PT to call us.  She has also noticed shortness of breath with activity.  She is also noticed marked dyspnea even doing routine activities at home and sometimes even just resting.  She has had occasional episodes of difficulty breathing when she lies down.  Accompanied by husband.  No chest pain, no leg edema, no hemoptysis, no syncope.  Past Medical History:  Diagnosis Date   Arthritis    Bronchitis    Cancer ([place])    dermatosivros arcoma and protuverans   Chronic back pain    degenerative disc disease   Constipation    stool softener daily   Constipation    GERD (gastroesophageal reflux disease)    takes Prilosec daily   GERD (gastroesophageal reflux disease)    Heart murmur    Heart valve disorder    Hemorrhoids    History of hiatal hernia    Insomnia    d/t chantix   Joint pain    Nocturia    Personal history of colonic adenoma 03/13/2003   03/13/2003 - 5 mm adenoma   Pre-diabetes    Prediabetes    Past Surgical History:  Procedure Laterality Date   ABDOMINAL HYSTERECTOMY     ANTERIOR LAT LUMBAR FUSION N/A 02/27/2021   Procedure: Anterior lateral interbody fusion - lateral two - lateral three - lateral three - lateral four with exploration fusion L4-S1;  Surgeon: Kary Kos, MD;  Location: [place];  Service: Neurosurgery;  Laterality:  N/A;   BACK SURGERY  1988   BACK SURGERY  09-03-11   rods,screws x 8    BUNIONECTOMY     bilateral   BUNIONECTOMY     bil feet   COLONOSCOPY     dermatosibrosarcoma protuberans     LAMINECTOMY WITH POSTERIOR LATERAL ARTHRODESIS LEVEL 3 N/A 02/27/2021   Procedure: Posterior augmentation with pedicle screws Latreal one - lateral four - Posterior Lateral and Interbody fusion;  Surgeon: Kary Kos, MD;  Location: [place];  Service: Neurosurgery;  Laterality: N/A;   PARTIAL HYSTERECTOMY     RECTOCELE REPAIR     Rectum Repair     RESECTION TUMOR WRIST RADICAL     x 2 rt   TUBAL LIGATION  1974   TUBAL LIGATION     WRIST SURGERY     left   Family History  Problem Relation Age of Onset   Leukemia Mother    Anesthesia problems Neg Hx    Hypotension Neg Hx    Malignant hyperthermia Neg Hx    Pseudochol deficiency Neg Hx    Colon cancer Neg Hx     Social History   Tobacco Use   Smoking status: Former    Packs/day: 1.00    Years: 50.00    Pack years: 50.00    Types: Cigarettes  Quit date: 08/17/2011    Years since quitting: 9.6   Smokeless tobacco: Never  Substance Use Topics   Alcohol use: No    Comment: occasional wine   Marital Status: Married  ROS  Review of Systems  Constitutional: Positive for malaise/fatigue.  Cardiovascular:  Positive for dyspnea on exertion and palpitations. Negative for chest pain, leg swelling and near-syncope.  Musculoskeletal:  Negative for back pain.  Gastrointestinal:  Negative for abdominal pain, heartburn, hematochezia and melena.  Objective  Blood pressure 130/82, pulse (!) 116, temperature 97.9 F (36.6 C), temperature source Temporal, resp. rate 17, height 5\' 2"  (1.575 m), weight 188 lb (85.3 kg), SpO2 98 %. Body mass index is 34.39 kg/m.  Vitals with BMI 04/04/2021 03/09/2021 03/09/2021  Height 5\' 2"  - -  Weight 188 lbs - -  BMI 44.03 - -  Systolic 474 259 563  Diastolic 82 66 69  Pulse 875 99 95     Physical Exam Constitutional:       General: She is not in acute distress.    Appearance: She is obese.  Neck:     Vascular: No carotid bruit or JVD.  Cardiovascular:     Rate and Rhythm: Regular rhythm. Tachycardia present.     Pulses: Intact distal pulses.     Heart sounds: No murmur heard.   Gallop present. S4 sounds present.  Pulmonary:     Effort: Pulmonary effort is normal.     Breath sounds: Normal breath sounds.  Abdominal:     General: Bowel sounds are normal.     Palpations: Abdomen is soft.  Musculoskeletal:        General: No swelling.     Laboratory examination:   Recent Labs    04/24/20 1600 02/19/21 0845 03/06/21 1633 03/07/21 0305 03/08/21 0818  NA 142   < > 138 137 134*  K 4.3   < > 3.2* 3.3* 3.1*  CL 105   < > 107 107 103  CO2 24   < > 24 23 23   GLUCOSE 95   < > 118* 108* 91  BUN 12   < > 9 8 6*  CREATININE 0.68   < > 0.67 0.59 0.60  CALCIUM 9.4   < > 8.1* 8.0* 7.8*  GFRNONAA 87   < > >60 >60 >60  GFRAA 100  --   --   --   --    < > = values in this interval not displayed.   CrCl cannot be calculated (Patient's most recent lab result is older than the maximum 21 days allowed.).  CMP Latest Ref Rng & Units 03/08/2021 03/07/2021 03/06/2021  Glucose 70 - 99 mg/dL 91 108(H) 118(H)  BUN 8 - 23 mg/dL 6(L) 8 9  Creatinine 0.44 - 1.00 mg/dL 0.60 0.59 0.67  Sodium 135 - 145 mmol/L 134(L) 137 138  Potassium 3.5 - 5.1 mmol/L 3.1(L) 3.3(L) 3.2(L)  Chloride 98 - 111 mmol/L 103 107 107  CO2 22 - 32 mmol/L 23 23 24   Calcium 8.9 - 10.3 mg/dL 7.8(L) 8.0(L) 8.1(L)  Total Protein 6.5 - 8.1 g/dL - - -  Total Bilirubin 0.3 - 1.2 mg/dL - - -  Alkaline Phos 38 - 126 U/L - - -  AST 15 - 41 U/L - - -  ALT 0 - 44 U/L - - -   CBC Latest Ref Rng & Units 03/01/2021 02/19/2021 04/24/2020  WBC 4.0 - 10.5 K/uL 11.7(H) 6.8 6.0  Hemoglobin 12.0 - 15.0  g/dL 12.5 14.0 14.1  Hematocrit 36.0 - 46.0 % 36.2 40.7 41.9  Platelets 150 - 400 K/uL 157 197 211    Lipid Panel Recent Labs    04/24/20 1600  CHOL 162   TRIG 161*  LDLCALC 87  HDL 47   Lipid Panel     Component Value Date/Time   CHOL 162 04/24/2020 1600   TRIG 161 (H) 04/24/2020 1600   HDL 47 04/24/2020 1600   LDLCALC 87 04/24/2020 1600   LABVLDL 28 04/24/2020 1600     HEMOGLOBIN A1C Lab Results  Component Value Date   HGBA1C 5.8 (H) 02/19/2021   MPG 119.76 02/19/2021   TSH No results for input(s): TSH in the last 8760 hours.   External labs:   Cholesterol, total 136.000 m 06/13/2020 HDL 53.000 mg 06/13/2020 LDL 65.000 mg 06/13/2020 Triglycerides 99.000 mg 06/13/2020  TSH 2.100 10/03/2019  Medications and allergies   Allergies  Allergen Reactions   Sulfa Antibiotics Hives and Itching   Atorvastatin Other (See Comments)    mucsle aches   Metformin Hcl Other (See Comments)    joint pains and muscle aches     Medication prior to this encounter:   Outpatient Medications Prior to Visit  Medication Sig Dispense Refill   acetaminophen (TYLENOL) 500 MG tablet Take 1,000 mg by mouth every 6 (six) hours as needed for moderate pain or headache.     albuterol (VENTOLIN HFA) 108 (90 Base) MCG/ACT inhaler Inhale 1-2 puffs into the lungs every 6 (six) hours as needed for wheezing or shortness of breath.     aspirin EC 81 MG tablet Take 81 mg by mouth daily. Swallow whole.     cyclobenzaprine (FLEXERIL) 5 MG tablet Take 5 mg by mouth See admin instructions. Take 5 mg at bedtime, may take a second 5 mg dose in the middle of the night as needed for spasms     docusate sodium (COLACE) 100 MG capsule Take 500 mg by mouth at bedtime.     fexofenadine (ALLEGRA) 180 MG tablet Take 180 mg by mouth daily as needed for allergies or rhinitis.     gabapentin (NEURONTIN) 300 MG capsule Take 1-2 capsules (300-600 mg total) by mouth 3 (three) times daily as needed (Nerve pain). 180 capsule 2   Misc Natural Products (BLACK CHERRY CONCENTRATE PO) Take 2 capsules by mouth daily.     omeprazole (PRILOSEC) 20 MG capsule Take 20 mg by mouth daily  before breakfast.     rosuvastatin (CRESTOR) 10 MG tablet Take 1 tablet (10 mg total) by mouth daily. 90 tablet 0   ascorbic acid (VITAMIN C) 1000 MG tablet Take 1,000 mg by mouth daily.     Calcium Carb-Cholecalciferol (CALCIUM 600 + D PO) Take 2 tablets by mouth 3 (three) times a week.     HYDROcodone-acetaminophen (NORCO/VICODIN) 5-325 MG tablet Take 1 tablet by mouth every 6 (six) hours as needed. (Patient taking differently: Take 1 tablet by mouth at bedtime as needed for moderate pain.) 15 tablet 0   ibuprofen (ADVIL) 200 MG tablet Take 400 mg by mouth every 6 (six) hours as needed for headache or moderate pain.     Multiple Vitamins-Minerals (HAIR/SKIN/NAILS) CAPS Take 2 capsules by mouth daily.     No facility-administered medications prior to visit.     Medication list after today's encounter   Current Outpatient Medications  Medication Instructions   acetaminophen (TYLENOL) 1,000 mg, Oral, Every 6 hours PRN   albuterol (VENTOLIN HFA) 108 (  90 Base) MCG/ACT inhaler 1-2 puffs, Inhalation, Every 6 hours PRN   aspirin EC 81 mg, Oral, Daily, Swallow whole.   cyclobenzaprine (FLEXERIL) 5 mg, Oral, See admin instructions, Take 5 mg at bedtime, may take a second 5 mg dose in the middle of the night as needed for spasms   docusate sodium (COLACE) 500 mg, Oral, Daily at bedtime   fexofenadine (ALLEGRA) 180 mg, Oral, Daily PRN   gabapentin (NEURONTIN) 300-600 mg, Oral, 3 times daily PRN   Misc Natural Products (BLACK CHERRY CONCENTRATE PO) 2 capsules, Oral, Daily   omeprazole (PRILOSEC) 20 mg, Oral, Daily before breakfast   rosuvastatin (CRESTOR) 10 mg, Oral, Daily    Radiology:   CT scan of the abdomen 03/07/2021: 1. Dilated and gas-filled portions of colon and small bowel with contrast transiting to the colon. Changes likely represent adynamic ileus. 2. Bilateral adrenal gland nodules, likely benign adenomas. No follow-up is indicated. 3. Fatty infiltration of the liver. 4.  Atelectasis in the lung bases. 5. Aortic atherosclerosis. 6. Postoperative changes in the lumbar spine.  CXR PA/LAT 04/03/2021: Few densities at the left lung base appear chronic and could represent a small amount of scarring. Otherwise, the lungs are clear. Heart and mediastinum are within normal limits. Spinal hardware in the upper lumbar spine. No pleural effusions.  Cardiac Studies:   Treadmill exercise stress test 06/01/2017: Indication: Dyspnea. Resting EKG demonstrates Normal sinus rhythm. The patient exercised on Bruce protocol for  6:00 min. Patient 7.05METS and reached HR 135   bpm, which is   90% of maximum age-predicted HR.  Stress test terminated due to Fatigue and dyspnea and achieved 90% of MPHR for age. Hypertensive at rest and normal response with stress. Rest BP  168/94 mm Hg. Chest Pain: none.  Arrhythmias: none. ST Changes: With peak exercise there was no ST-T changes of ischemia. Overall Impression: Normal stress test. Exercise capacity was below average for age.  EKG:   EKG 04/04/2021: Sinus tachycardia at rate of 106 bpm, incomplete right bundle branch block.  Normal EKG.      Assessment     ICD-10-CM   1. Palpitations  R00.2 EKG 12-Lead    2. Dyspnea on exertion  R06.00 DG Chest 2 View    NM Pulmonary Perf and Vent    PCV ECHOCARDIOGRAM COMPLETE    Pro b natriuretic peptide (BNP)    3. Post-operative state  Z98.890 NM Pulmonary Perf and Vent    4. Sinus tachycardia  R00.0 NM Pulmonary Perf and Vent       Medications Discontinued During This Encounter  Medication Reason   ascorbic acid (VITAMIN C) 1000 MG tablet Error   Calcium Carb-Cholecalciferol (CALCIUM 600 + D PO) Error   HYDROcodone-acetaminophen (NORCO/VICODIN) 5-325 MG tablet Error   ibuprofen (ADVIL) 200 MG tablet Error   Multiple Vitamins-Minerals (HAIR/SKIN/NAILS) CAPS Error    No orders of the defined types were placed in this encounter.  Orders Placed This Encounter  Procedures   DG  Chest 2 View    Standing Status:   Future    Number of Occurrences:   1    Standing Expiration Date:   06/04/2021    Order Specific Question:   Reason for Exam (SYMPTOM  OR DIAGNOSIS REQUIRED)    Answer:   Shortness of breath. Tachycardia, Post op    Order Specific Question:   Preferred imaging location?    Answer:   [facility]    Order Specific Question:  Radiology Contrast Protocol - do NOT remove file path    Answer:   [email or web address]   NM Pulmonary Perf and Vent    Standing Status:   Future    Standing Expiration Date:   04/04/2022    Order Specific Question:   If indicated for the ordered procedure, I authorize the administration of a radiopharmaceutical per Radiology protocol    Answer:   Yes    Order Specific Question:   Preferred imaging location?    Answer:   External   Pro b natriuretic peptide (BNP)   EKG 12-Lead   PCV ECHOCARDIOGRAM COMPLETE    Standing Status:   Future    Standing Expiration Date:   04/04/2022   Recommendations:   Janice Pace is a 75 y.o. Patient with hyperlipidemia, chronic back pain, underwent anterior lateral interbody fusion of L2-3 and 3 4 with posterior spinal fixation on 02/27/2021, hospital stay was complicated by GI bleed from colitis, she also had UTI and tachycardia related to above-mentioned conditions.  Patient made an appointment to see [place] as her heart rate has been >100 and continues to have marked shortness of breath was recommended by home PT to call us.  She has also noticed shortness of breath even doing routine activities at home, has had occasional episodes of orthopnea as well.  I am concerned about her ongoing sinus tachycardia in the absence of any other physiologic condition presently, worsening dyspnea.  Since being postop, I would like to obtain a VQ scan to exclude PE, will obtain a chest x-ray and also an echocardiogram.  Obtain proBNP.  Patient walked in the  hallway for about less than 100 m, oxygen saturation dropped from 98% to 95% but no other significant desaturation but patient was markedly dyspneic.  No clinical evidence of heart failure, lungs are quiet  I do not suspect ACS or significant CAD, we could consider cardiac evaluation when she is more stable.  She does have aortic atherosclerosis, she is on statin, lipids are well controlled, external labs reviewed.  She is not anemic and TSH is normal.  Office visit in 4 weeks.  This was a 60-minute office visit encounter with greater than 30% of the time spent face-to-face with the patient and her husband and discussions regarding various life-threatening situations and differential diagnosis.  CXR reviewed.     Adrian Prows, MD, [facility] 04/04/2021, 7:38 PM Office: [phone]

## 2021-04-05 LAB — PRO B NATRIURETIC PEPTIDE: NT-Pro BNP: 53 pg/mL (ref 0–301)

## 2021-04-09 DIAGNOSIS — E7849 Other hyperlipidemia: Secondary | ICD-10-CM | POA: Diagnosis not present

## 2021-04-09 DIAGNOSIS — M1712 Unilateral primary osteoarthritis, left knee: Secondary | ICD-10-CM | POA: Diagnosis not present

## 2021-04-09 DIAGNOSIS — G8929 Other chronic pain: Secondary | ICD-10-CM | POA: Diagnosis not present

## 2021-04-09 DIAGNOSIS — G5602 Carpal tunnel syndrome, left upper limb: Secondary | ICD-10-CM | POA: Diagnosis not present

## 2021-04-09 DIAGNOSIS — E559 Vitamin D deficiency, unspecified: Secondary | ICD-10-CM | POA: Diagnosis not present

## 2021-04-09 DIAGNOSIS — E119 Type 2 diabetes mellitus without complications: Secondary | ICD-10-CM | POA: Diagnosis not present

## 2021-04-09 DIAGNOSIS — I700 Atherosclerosis of aorta: Secondary | ICD-10-CM | POA: Diagnosis not present

## 2021-04-09 DIAGNOSIS — K529 Noninfective gastroenteritis and colitis, unspecified: Secondary | ICD-10-CM | POA: Diagnosis not present

## 2021-04-09 DIAGNOSIS — Z7982 Long term (current) use of aspirin: Secondary | ICD-10-CM | POA: Diagnosis not present

## 2021-04-09 DIAGNOSIS — M5116 Intervertebral disc disorders with radiculopathy, lumbar region: Secondary | ICD-10-CM | POA: Diagnosis not present

## 2021-04-09 DIAGNOSIS — M48061 Spinal stenosis, lumbar region without neurogenic claudication: Secondary | ICD-10-CM | POA: Diagnosis not present

## 2021-04-09 DIAGNOSIS — R7303 Prediabetes: Secondary | ICD-10-CM | POA: Diagnosis not present

## 2021-04-09 DIAGNOSIS — Z9181 History of falling: Secondary | ICD-10-CM | POA: Diagnosis not present

## 2021-04-09 DIAGNOSIS — K219 Gastro-esophageal reflux disease without esophagitis: Secondary | ICD-10-CM | POA: Diagnosis not present

## 2021-04-09 DIAGNOSIS — Z85831 Personal history of malignant neoplasm of soft tissue: Secondary | ICD-10-CM | POA: Diagnosis not present

## 2021-04-09 DIAGNOSIS — Z87891 Personal history of nicotine dependence: Secondary | ICD-10-CM | POA: Diagnosis not present

## 2021-04-09 DIAGNOSIS — Z86010 Personal history of colonic polyps: Secondary | ICD-10-CM | POA: Diagnosis not present

## 2021-04-09 DIAGNOSIS — G4700 Insomnia, unspecified: Secondary | ICD-10-CM | POA: Diagnosis not present

## 2021-04-09 DIAGNOSIS — Z4789 Encounter for other orthopedic aftercare: Secondary | ICD-10-CM | POA: Diagnosis not present

## 2021-04-10 ENCOUNTER — Ambulatory Visit: Payer: Medicare Other | Admitting: Cardiology

## 2021-04-16 ENCOUNTER — Other Ambulatory Visit: Payer: Self-pay

## 2021-04-16 ENCOUNTER — Ambulatory Visit: Payer: Medicare Other

## 2021-04-16 DIAGNOSIS — R0609 Other forms of dyspnea: Secondary | ICD-10-CM | POA: Diagnosis not present

## 2021-04-18 DIAGNOSIS — Z4789 Encounter for other orthopedic aftercare: Secondary | ICD-10-CM | POA: Diagnosis not present

## 2021-04-18 DIAGNOSIS — M48061 Spinal stenosis, lumbar region without neurogenic claudication: Secondary | ICD-10-CM | POA: Diagnosis not present

## 2021-04-18 DIAGNOSIS — K219 Gastro-esophageal reflux disease without esophagitis: Secondary | ICD-10-CM | POA: Diagnosis not present

## 2021-04-18 DIAGNOSIS — G5602 Carpal tunnel syndrome, left upper limb: Secondary | ICD-10-CM | POA: Diagnosis not present

## 2021-04-18 DIAGNOSIS — I700 Atherosclerosis of aorta: Secondary | ICD-10-CM | POA: Diagnosis not present

## 2021-04-18 DIAGNOSIS — M5440 Lumbago with sciatica, unspecified side: Secondary | ICD-10-CM | POA: Diagnosis not present

## 2021-04-18 DIAGNOSIS — Z87891 Personal history of nicotine dependence: Secondary | ICD-10-CM | POA: Diagnosis not present

## 2021-04-18 DIAGNOSIS — M5116 Intervertebral disc disorders with radiculopathy, lumbar region: Secondary | ICD-10-CM | POA: Diagnosis not present

## 2021-04-18 DIAGNOSIS — G4700 Insomnia, unspecified: Secondary | ICD-10-CM | POA: Diagnosis not present

## 2021-04-18 DIAGNOSIS — E119 Type 2 diabetes mellitus without complications: Secondary | ICD-10-CM | POA: Diagnosis not present

## 2021-04-18 DIAGNOSIS — K529 Noninfective gastroenteritis and colitis, unspecified: Secondary | ICD-10-CM | POA: Diagnosis not present

## 2021-04-18 DIAGNOSIS — R7303 Prediabetes: Secondary | ICD-10-CM | POA: Diagnosis not present

## 2021-04-18 DIAGNOSIS — Z7982 Long term (current) use of aspirin: Secondary | ICD-10-CM | POA: Diagnosis not present

## 2021-04-18 DIAGNOSIS — E7849 Other hyperlipidemia: Secondary | ICD-10-CM | POA: Diagnosis not present

## 2021-04-18 DIAGNOSIS — E559 Vitamin D deficiency, unspecified: Secondary | ICD-10-CM | POA: Diagnosis not present

## 2021-04-18 DIAGNOSIS — Z86010 Personal history of colonic polyps: Secondary | ICD-10-CM | POA: Diagnosis not present

## 2021-04-18 DIAGNOSIS — M1712 Unilateral primary osteoarthritis, left knee: Secondary | ICD-10-CM | POA: Diagnosis not present

## 2021-04-18 DIAGNOSIS — Z85831 Personal history of malignant neoplasm of soft tissue: Secondary | ICD-10-CM | POA: Diagnosis not present

## 2021-04-18 DIAGNOSIS — G8929 Other chronic pain: Secondary | ICD-10-CM | POA: Diagnosis not present

## 2021-04-18 DIAGNOSIS — Z9181 History of falling: Secondary | ICD-10-CM | POA: Diagnosis not present

## 2021-05-07 ENCOUNTER — Other Ambulatory Visit: Payer: Self-pay

## 2021-05-07 ENCOUNTER — Ambulatory Visit (INDEPENDENT_AMBULATORY_CARE_PROVIDER_SITE_OTHER): Payer: Medicare Other | Admitting: Physician Assistant

## 2021-05-07 ENCOUNTER — Encounter: Payer: Self-pay | Admitting: Physician Assistant

## 2021-05-07 VITALS — BP 118/74 | HR 99 | Temp 97.9°F | Ht 62.0 in | Wt 190.5 lb

## 2021-05-07 DIAGNOSIS — R0609 Other forms of dyspnea: Secondary | ICD-10-CM | POA: Diagnosis not present

## 2021-05-07 DIAGNOSIS — Z23 Encounter for immunization: Secondary | ICD-10-CM

## 2021-05-07 DIAGNOSIS — K5900 Constipation, unspecified: Secondary | ICD-10-CM | POA: Diagnosis not present

## 2021-05-07 DIAGNOSIS — Z87891 Personal history of nicotine dependence: Secondary | ICD-10-CM | POA: Diagnosis not present

## 2021-05-07 DIAGNOSIS — Z7689 Persons encountering health services in other specified circumstances: Secondary | ICD-10-CM

## 2021-05-07 DIAGNOSIS — M5416 Radiculopathy, lumbar region: Secondary | ICD-10-CM | POA: Diagnosis not present

## 2021-05-07 DIAGNOSIS — E782 Mixed hyperlipidemia: Secondary | ICD-10-CM

## 2021-05-07 DIAGNOSIS — K219 Gastro-esophageal reflux disease without esophagitis: Secondary | ICD-10-CM | POA: Diagnosis not present

## 2021-05-07 NOTE — Progress Notes (Signed)
Established Patient Office Visit  Subjective:  Patient ID: Janice Pace, female    DOB: 04-30-1946  Age: 75 y.o. MRN: [id number]  CC:  Chief Complaint  Patient presents with   New Patient (Initial Visit)    HPI Janice Pace presents to establish care. Patient has past medical hx of GERD, hyperlipidemia, prediabetes, lumbar DDD, lumbar radiculopathy, spinal stenosis and constipation.  Patient reports since her hospitalization in August 2022 has been experiencing increased heart rate and intermittent fluttering sensation.  Is currently followed by cardiology.  Also reports chronic shortness of breath on exertion which has been attributed to chronic bronchitis.  Patient is a former smoker.  States smoked from the age of [age], 1 pack/day.  Patient was admitted to the hospital on August 17th of 2022 for anterior lateral interbody fusions at L2-3 and L3-4 with posterior spinal fixation from L1-L4.  Hospital stay was complicated by ileus and UTI. Patient takes rosuvastatin 10 mg for hyperlipidemia, omeprazole 20 mg for GERD, gabapentin and flexeril as needed for chronic back pain. Takes a stool softener for constipation.    Past Medical History:  Diagnosis Date   Arthritis    Bronchitis    Cancer ([place])    dermatosivros arcoma and protuverans   Chronic back pain    degenerative disc disease   Constipation    stool softener daily   Constipation    GERD (gastroesophageal reflux disease)    takes Prilosec daily   GERD (gastroesophageal reflux disease)    Heart murmur    Heart valve disorder    Hemorrhoids    History of hiatal hernia    Insomnia    d/t chantix   Joint pain    Nocturia    Personal history of colonic adenoma 03/13/2003   03/13/2003 - 5 mm adenoma   Pre-diabetes    Prediabetes     Past Surgical History:  Procedure Laterality Date   ABDOMINAL HYSTERECTOMY     ANTERIOR LAT LUMBAR FUSION N/A 02/27/2021   Procedure: Anterior lateral interbody fusion - lateral two -  lateral three - lateral three - lateral four with exploration fusion L4-S1;  Surgeon: Kary Kos, MD;  Location: [place];  Service: Neurosurgery;  Laterality: N/A;   BACK SURGERY  1988   BACK SURGERY  09-03-11   rods,screws x 8    BUNIONECTOMY     bilateral   BUNIONECTOMY     bil feet   COLONOSCOPY     dermatosibrosarcoma protuberans     LAMINECTOMY WITH POSTERIOR LATERAL ARTHRODESIS LEVEL 3 N/A 02/27/2021   Procedure: Posterior augmentation with pedicle screws Latreal one - lateral four - Posterior Lateral and Interbody fusion;  Surgeon: Kary Kos, MD;  Location: [place];  Service: Neurosurgery;  Laterality: N/A;   PARTIAL HYSTERECTOMY     RECTOCELE REPAIR     Rectum Repair     RESECTION TUMOR WRIST RADICAL     x 2 rt   TUBAL LIGATION  1974   TUBAL LIGATION     WRIST SURGERY     left    Family History  Problem Relation Age of Onset   Leukemia Mother    Anesthesia problems Neg Hx    Hypotension Neg Hx    Malignant hyperthermia Neg Hx    Pseudochol deficiency Neg Hx    Colon cancer Neg Hx     Social History   Socioeconomic History   Marital status: Married    Spouse name: david   Number  of children: 3   Years of education: Not on file   Highest education level: Not on file  Occupational History   Occupation: retired  Tobacco Use   Smoking status: Former    Packs/day: 1.00    Years: 50.00    Pack years: 50.00    Types: Cigarettes    Quit date: 08/17/2011    Years since quitting: 9.7   Smokeless tobacco: Never  Vaping Use   Vaping Use: Never used  Substance and Sexual Activity   Alcohol use: No    Comment: occasional wine   Drug use: No   Sexual activity: Yes    Birth control/protection: Surgical, Other-see comments  Other Topics Concern   Not on file  Social History Narrative   ** Merged History Encounter **       Social Determinants of Health   Financial Resource Strain: Not on file  Food Insecurity: Not on file  Transportation Needs: Not on file   Physical Activity: Not on file  Stress: Not on file  Social Connections: Not on file  Intimate Partner Violence: Not on file    Outpatient Medications Prior to Visit  Medication Sig Dispense Refill   acetaminophen (TYLENOL) 500 MG tablet Take 1,000 mg by mouth every 6 (six) hours as needed for moderate pain or headache.     albuterol (VENTOLIN HFA) 108 (90 Base) MCG/ACT inhaler Inhale 1-2 puffs into the lungs every 6 (six) hours as needed for wheezing or shortness of breath.     aspirin EC 81 MG tablet Take 81 mg by mouth daily. Swallow whole.     cyclobenzaprine (FLEXERIL) 5 MG tablet Take 5 mg by mouth See admin instructions. Take 5 mg at bedtime, may take a second 5 mg dose in the middle of the night as needed for spasms     docusate sodium (COLACE) 100 MG capsule Take 500 mg by mouth at bedtime.     fexofenadine (ALLEGRA) 180 MG tablet Take 180 mg by mouth daily as needed for allergies or rhinitis.     gabapentin (NEURONTIN) 300 MG capsule Take 1-2 capsules (300-600 mg total) by mouth 3 (three) times daily as needed (Nerve pain). 180 capsule 2   Misc Natural Products (BLACK CHERRY CONCENTRATE PO) Take 2 capsules by mouth daily.     omeprazole (PRILOSEC) 20 MG capsule Take 20 mg by mouth daily before breakfast.     rosuvastatin (CRESTOR) 10 MG tablet Take 1 tablet (10 mg total) by mouth daily. 90 tablet 0   No facility-administered medications prior to visit.    Allergies  Allergen Reactions   Sulfa Antibiotics Hives and Itching   Atorvastatin Other (See Comments)    mucsle aches   Metformin Hcl Other (See Comments)    joint pains and muscle aches    ROS Review of Systems Review of Systems:  A fourteen system review of systems was performed and found to be positive as per HPI.   Objective:    Physical Exam General:  Pleasant and cooperative, in no acute distress  Neuro:  Alert and oriented,  extra-ocular muscles intact  HEENT:  Normocephalic, atraumatic, neck  supple Skin:  no gross rash, warm, pink. Cardiac:  RRR Respiratory:  CTA B/L, Not using accessory muscles, speaking in full sentences- unlabored. Vascular:  Ext warm, no cyanosis apprec. Psych:  No HI/SI, judgement and insight good, Euthymic mood. Full Affect.  BP 118/74   Pulse 99   Temp 97.9 F (36.6 C)  Ht 5\' 2"  (1.575 m)   Wt 190 lb 8 oz (86.4 kg)   SpO2 99%   BMI 34.84 kg/m  Wt Readings from Last 3 Encounters:  05/07/21 190 lb 8 oz (86.4 kg)  04/04/21 188 lb (85.3 kg)  03/08/21 208 lb 12.4 oz (94.7 kg)     Health Maintenance Due  Topic Date Due   Pneumonia Vaccine 61+ Years old (1 - PCV) Never done   FOOT EXAM  Never done   OPHTHALMOLOGY EXAM  Never done   URINE MICROALBUMIN  Never done   Hepatitis C Screening  Never done   TETANUS/TDAP  Never done   MAMMOGRAM  Never done   DEXA SCAN  Never done   COVID-19 Vaccine (3 - Booster for [place] series) 10/19/2019    There are no preventive care reminders to display for this patient.  Lab Results  Component Value Date   TSH 2.100 10/03/2019   Lab Results  Component Value Date   WBC 11.7 (H) 03/01/2021   HGB 12.5 03/01/2021   HCT 36.2 03/01/2021   MCV 93.5 03/01/2021   PLT 157 03/01/2021   Lab Results  Component Value Date   NA 134 (L) 03/08/2021   K 3.1 (L) 03/08/2021   CO2 23 03/08/2021   GLUCOSE 91 03/08/2021   BUN 6 (L) 03/08/2021   CREATININE 0.60 03/08/2021   BILITOT 0.6 03/05/2021   ALKPHOS 49 03/05/2021   AST 24 03/05/2021   ALT 29 03/05/2021   PROT 4.9 (L) 03/05/2021   ALBUMIN 2.4 (L) 03/05/2021   CALCIUM 7.8 (L) 03/08/2021   ANIONGAP 8 03/08/2021   Lab Results  Component Value Date   CHOL 162 04/24/2020   Lab Results  Component Value Date   HDL 47 04/24/2020   Lab Results  Component Value Date   LDLCALC 87 04/24/2020   Lab Results  Component Value Date   TRIG 161 (H) 04/24/2020   No results found for: CHOLHDL Lab Results  Component Value Date   HGBA1C 5.8 (H) 02/19/2021       Assessment & Plan:   Problem List Items Addressed This Visit       Digestive   GERD (gastroesophageal reflux disease)     Other   Hyperlipidemia   Other Visit Diagnoses     Encounter to establish care    -  Primary   Need for influenza vaccination       Relevant Orders   Flu Vaccine QUAD High Dose(Fluad) (Completed)   Dyspnea on exertion       Former smoker       Lumbar radiculopathy          Encounter to establish care: -Will review records once obtained. -Reviewed hospital notes, labs and imaging 02/27/2021. -Recommend to schedule CPE and FBW.   Hyperlipidemia: -Continue rosuvastatin 10 mg. -Will continue to monitor.  Dyspnea on exertion: -Followed by Cardiology. -CXR 04/04/2021: no active cardiopulmonary disease, lungs clear with few densities at the left lung base could represent scarring, no pleural effusion. -CT abd pelvis 03/07/2021: lower chest- linear atelectasis in the lung bases, calcified granuloma on right  -Pending cardiology work-up, recommend referral to pulmonology if symptoms fail to improve or worsen.  GERD: -Continue omeprazole. -Will continue to monitor.  Lumbar radiculopathy: -Followed by Neurosurgery.  -On gabapentin and flexeril.  Constipation: -Continue stool softener.   No orders of the defined types were placed in this encounter.   Follow-up: Return in about 3 weeks (around 05/28/2021) for  CPE and FBW include Vit D few days prior .    Lorrene Reid, PA-C

## 2021-05-10 ENCOUNTER — Other Ambulatory Visit: Payer: Self-pay

## 2021-05-10 ENCOUNTER — Encounter: Payer: Self-pay | Admitting: Cardiology

## 2021-05-10 ENCOUNTER — Ambulatory Visit: Payer: Medicare Other | Admitting: Cardiology

## 2021-05-10 VITALS — BP 142/86 | HR 96 | Temp 96.7°F | Resp 16 | Ht 62.0 in | Wt 188.4 lb

## 2021-05-10 DIAGNOSIS — R0609 Other forms of dyspnea: Secondary | ICD-10-CM | POA: Diagnosis not present

## 2021-05-10 DIAGNOSIS — R002 Palpitations: Secondary | ICD-10-CM

## 2021-05-10 DIAGNOSIS — R000 Tachycardia, unspecified: Secondary | ICD-10-CM

## 2021-05-10 DIAGNOSIS — R030 Elevated blood-pressure reading, without diagnosis of hypertension: Secondary | ICD-10-CM

## 2021-05-10 MED ORDER — METOPROLOL SUCCINATE ER 50 MG PO TB24: 50.0000 mg | ORAL_TABLET | Freq: Every day | ORAL | 2 refills | Status: DC

## 2021-05-10 NOTE — Progress Notes (Signed)
Primary Physician/Referring:  Lorrene Reid, PA-C  Patient ID: Janice Pace, female    DOB: 1945-09-29, 75 y.o.   MRN: [id number]  Chief Complaint  Patient presents with   Shortness of Breath   Follow-up    4 weeks   HPI:    Janice Pace  is a 75 y.o. Patient with hyperlipidemia, chronic back pain, underwent anterior lateral interbody fusion of L2-3 and 3 4 with posterior spinal fixation on 02/27/2021, hospital stay was complicated by GI bleed from colitis, she also had UTI and tachycardia related to above-mentioned conditions.  Patient made an appointment to see Janice Pace as her heart rate has been >100 and was recommended by home PT to call Janice Pace.  She has also noticed shortness of breath with activity.  She is also noticed marked dyspnea even doing routine activities at home and sometimes even just resting.  She has had occasional episodes of difficulty breathing when she lies down.  Accompanied by husband.  No chest pain, no leg edema, no hemoptysis, no syncope.  I had seen her about 4 to 6 weeks ago for resting sinus tachycardia, worsening dyspnea and fatigue.  I recommended a VQ scan and also an echocardiogram.  Chest x-ray did not reveal anything significant.  However VQ scan has not been performed.  She now presents for follow-up.  States that she has started to feel better since last office visit.  Dyspnea slightly improved.  Denies any leg edema.  Accompanied by her husband.  No chest pain.  Past Medical History:  Diagnosis Date   Arthritis    Bronchitis    Cancer ([place])    dermatosivros arcoma and protuverans   Chronic back pain    degenerative disc disease   Constipation    stool softener daily   Constipation    GERD (gastroesophageal reflux disease)    takes Prilosec daily   GERD (gastroesophageal reflux disease)    Heart murmur    Heart valve disorder    Hemorrhoids    History of hiatal hernia    Insomnia    d/t chantix   Joint pain    Nocturia    Personal history of  colonic adenoma 03/13/2003   03/13/2003 - 5 mm adenoma   Pre-diabetes    Prediabetes    Past Surgical History:  Procedure Laterality Date   ABDOMINAL HYSTERECTOMY     ANTERIOR LAT LUMBAR FUSION N/A 02/27/2021   Procedure: Anterior lateral interbody fusion - lateral two - lateral three - lateral three - lateral four with exploration fusion L4-S1;  Surgeon: Kary Kos, MD;  Location: [facility];  Service: Neurosurgery;  Laterality: N/A;   BACK SURGERY  1988   BACK SURGERY  09-03-11   rods,screws x 8    BUNIONECTOMY     bilateral   BUNIONECTOMY     bil feet   COLONOSCOPY     dermatosibrosarcoma protuberans     LAMINECTOMY WITH POSTERIOR LATERAL ARTHRODESIS LEVEL 3 N/A 02/27/2021   Procedure: Posterior augmentation with pedicle screws Latreal one - lateral four - Posterior Lateral and Interbody fusion;  Surgeon: Kary Kos, MD;  Location: [facility];  Service: Neurosurgery;  Laterality: N/A;   PARTIAL HYSTERECTOMY     RECTOCELE REPAIR     Rectum Repair     RESECTION TUMOR WRIST RADICAL     x 2 rt   TUBAL LIGATION  1974   TUBAL LIGATION     WRIST SURGERY     left  Family History  Problem Relation Age of Onset   Leukemia Mother    Anesthesia problems Neg Hx    Hypotension Neg Hx    Malignant hyperthermia Neg Hx    Pseudochol deficiency Neg Hx    Colon cancer Neg Hx     Social History   Tobacco Use   Smoking status: Former    Packs/day: 1.00    Years: 50.00    Pack years: 50.00    Types: Cigarettes    Quit date: 08/17/2011    Years since quitting: 9.7   Smokeless tobacco: Never  Substance Use Topics   Alcohol use: No    Comment: occasional wine   Marital Status: Married  ROS  Review of Systems  Constitutional: Positive for malaise/fatigue.  Cardiovascular:  Positive for dyspnea on exertion and palpitations. Negative for chest pain, leg swelling and near-syncope.  Musculoskeletal:  Negative for back pain.  Gastrointestinal:  Negative for abdominal pain, heartburn, hematochezia  and melena.  Objective  Blood pressure (!) 142/86, pulse 96, temperature (!) 96.7 F (35.9 C), temperature source Temporal, resp. rate 16, height 5\' 2"  (1.575 m), weight 188 lb 6.4 oz (85.5 kg), SpO2 97 %. Body mass index is 34.46 kg/m.  Vitals with BMI 05/10/2021 05/07/2021 05/07/2021  Height 5\' 2"  - 5\' 2"   Weight 188 lbs 6 oz - 190 lbs 8 oz  BMI 15.72 - 62.03  Systolic 559 741 638  Diastolic 86 74 74  Pulse 96 - 99     Physical Exam Constitutional:      General: She is not in acute distress.    Appearance: She is obese.  Neck:     Vascular: No carotid bruit or JVD.  Cardiovascular:     Rate and Rhythm: Regular rhythm. Tachycardia present.     Pulses: Intact distal pulses.     Heart sounds: No murmur heard.   Gallop present. S4 sounds present.  Pulmonary:     Effort: Pulmonary effort is normal.     Breath sounds: Normal breath sounds.  Abdominal:     General: Bowel sounds are normal.     Palpations: Abdomen is soft.  Musculoskeletal:        General: No swelling.     Laboratory examination:   Recent Labs    03/06/21 1633 03/07/21 0305 03/08/21 0818  NA 138 137 134*  K 3.2* 3.3* 3.1*  CL 107 107 103  CO2 24 23 23   GLUCOSE 118* 108* 91  BUN 9 8 6*  CREATININE 0.67 0.59 0.60  CALCIUM 8.1* 8.0* 7.8*  GFRNONAA >60 >60 >60   CrCl cannot be calculated (Patient's most recent lab result is older than the maximum 21 days allowed.).  CMP Latest Ref Rng & Units 03/08/2021 03/07/2021 03/06/2021  Glucose 70 - 99 mg/dL 91 108(H) 118(H)  BUN 8 - 23 mg/dL 6(L) 8 9  Creatinine 0.44 - 1.00 mg/dL 0.60 0.59 0.67  Sodium 135 - 145 mmol/L 134(L) 137 138  Potassium 3.5 - 5.1 mmol/L 3.1(L) 3.3(L) 3.2(L)  Chloride 98 - 111 mmol/L 103 107 107  CO2 22 - 32 mmol/L 23 23 24   Calcium 8.9 - 10.3 mg/dL 7.8(L) 8.0(L) 8.1(L)  Total Protein 6.5 - 8.1 g/dL - - -  Total Bilirubin 0.3 - 1.2 mg/dL - - -  Alkaline Phos 38 - 126 U/L - - -  AST 15 - 41 U/L - - -  ALT 0 - 44 U/L - - -   CBC  Latest Ref  Rng & Units 03/01/2021 02/19/2021 04/24/2020  WBC 4.0 - 10.5 K/uL 11.7(H) 6.8 6.0  Hemoglobin 12.0 - 15.0 g/dL 12.5 14.0 14.1  Hematocrit 36.0 - 46.0 % 36.2 40.7 41.9  Platelets 150 - 400 K/uL 157 197 211   Lipid Panel No results for input(s): CHOL, TRIG, LDLCALC, VLDL, HDL, CHOLHDL, LDLDIRECT in the last 8760 hours.  Lipid Panel     Component Value Date/Time   CHOL 162 04/24/2020 1600   TRIG 161 (H) 04/24/2020 1600   HDL 47 04/24/2020 1600   LDLCALC 87 04/24/2020 1600   LABVLDL 28 04/24/2020 1600     HEMOGLOBIN A1C Lab Results  Component Value Date   HGBA1C 5.8 (H) 02/19/2021   MPG 119.76 02/19/2021   TSH No results for input(s): TSH in the last 8760 hours.   External labs:   Cholesterol, total 136.000 m 06/13/2020 HDL 53.000 mg 06/13/2020 LDL 65.000 mg 06/13/2020 Triglycerides 99.000 mg 06/13/2020  TSH 2.100 10/03/2019  Medications and allergies   Allergies  Allergen Reactions   Sulfa Antibiotics Hives and Itching   Atorvastatin Other (See Comments)    mucsle aches   Metformin Hcl Other (See Comments)    joint pains and muscle aches     Medication prior to this encounter:   Outpatient Medications Prior to Visit  Medication Sig Dispense Refill   acetaminophen (TYLENOL) 500 MG tablet Take 1,000 mg by mouth every 6 (six) hours as needed for moderate pain or headache.     albuterol (VENTOLIN HFA) 108 (90 Base) MCG/ACT inhaler Inhale 1-2 puffs into the lungs every 6 (six) hours as needed for wheezing or shortness of breath.     aspirin EC 81 MG tablet Take 81 mg by mouth daily. Swallow whole.     cyclobenzaprine (FLEXERIL) 5 MG tablet Take 5 mg by mouth See admin instructions. Take 5 mg at bedtime, may take a second 5 mg dose in the middle of the night as needed for spasms     docusate sodium (COLACE) 100 MG capsule Take 500 mg by mouth at bedtime.     fexofenadine (ALLEGRA) 180 MG tablet Take 180 mg by mouth daily as needed for allergies or rhinitis.      gabapentin (NEURONTIN) 300 MG capsule Take 1-2 capsules (300-600 mg total) by mouth 3 (three) times daily as needed (Nerve pain). 180 capsule 2   Misc Natural Products (BLACK CHERRY CONCENTRATE PO) Take 2 capsules by mouth daily.     omeprazole (PRILOSEC) 20 MG capsule Take 20 mg by mouth daily before breakfast.     rosuvastatin (CRESTOR) 10 MG tablet Take 1 tablet (10 mg total) by mouth daily. 90 tablet 0   No facility-administered medications prior to visit.     Medication list after today's encounter   Current Outpatient Medications  Medication Instructions   acetaminophen (TYLENOL) 1,000 mg, Oral, Every 6 hours PRN   albuterol (VENTOLIN HFA) 108 (90 Base) MCG/ACT inhaler 1-2 puffs, Inhalation, Every 6 hours PRN   aspirin EC 81 mg, Oral, Daily, Swallow whole.   cyclobenzaprine (FLEXERIL) 5 mg, Oral, See admin instructions, Take 5 mg at bedtime, may take a second 5 mg dose in the middle of the night as needed for spasms   docusate sodium (COLACE) 500 mg, Oral, Daily at bedtime   fexofenadine (ALLEGRA) 180 mg, Oral, Daily PRN   gabapentin (NEURONTIN) 300-600 mg, Oral, 3 times daily PRN   metoprolol succinate (TOPROL-XL) 50 mg, Oral, Daily, Take with or immediately following a  meal.   Misc Natural Products (BLACK CHERRY CONCENTRATE PO) 2 capsules, Oral, Daily   omeprazole (PRILOSEC) 20 mg, Oral, Daily before breakfast   rosuvastatin (CRESTOR) 10 mg, Oral, Daily    Radiology:   CT scan of the abdomen 03/07/2021: 1. Dilated and gas-filled portions of colon and small bowel with contrast transiting to the colon. Changes likely represent adynamic ileus. 2. Bilateral adrenal gland nodules, likely benign adenomas. No follow-up is indicated. 3. Fatty infiltration of the liver. 4. Atelectasis in the lung bases. 5. Aortic atherosclerosis. 6. Postoperative changes in the lumbar spine.  CXR PA/LAT 04/03/2021: Few densities at the left lung base appear chronic and could represent a small  amount of scarring. Otherwise, the lungs are clear. Heart and mediastinum are within normal limits. Spinal hardware in the upper lumbar spine. No pleural effusions.  Cardiac Studies:   Treadmill exercise stress test 06/01/2017: Indication: Dyspnea. Resting EKG demonstrates Normal sinus rhythm. The patient exercised on Bruce protocol for  6:00 min. Patient 7.05METS and reached HR 135   bpm, which is   90% of maximum age-predicted HR.  Stress test terminated due to Fatigue and dyspnea and achieved 90% of MPHR for age. Hypertensive at rest and normal response with stress. Rest BP  168/94 mm Hg. Chest Pain: none.  Arrhythmias: none. ST Changes: With peak exercise there was no ST-T changes of ischemia. Overall Impression: Normal stress test. Exercise capacity was below average for age.  PCV ECHOCARDIOGRAM COMPLETE 04/16/2021  Narrative Echocardiogram 04/16/2021: Left ventricle cavity is normal in size. Moderate concentric hypertrophy of the left ventricle. Normal global wall motion. Normal LV systolic function with visual EF 55-60%. Doppler evidence of grade I (impaired) diastolic dysfunction, normal LAP. Trace TR. No evidence of pulmonary hypertension.     EKG:   EKG 04/04/2021: Sinus tachycardia at rate of 106 bpm, incomplete right bundle branch block.  Normal EKG.      Assessment     ICD-10-CM   1. Palpitations  R00.2 metoprolol succinate (TOPROL-XL) 50 MG 24 hr tablet    2. Dyspnea on exertion  R06.09 PCV MYOCARDIAL PERFUSION WITH LEXISCAN    3. Sinus tachycardia  R00.0 metoprolol succinate (TOPROL-XL) 50 MG 24 hr tablet    4. Elevated blood pressure reading in office without diagnosis of hypertension  R03.0        There are no discontinued medications.   Meds ordered this encounter  Medications   metoprolol succinate (TOPROL-XL) 50 MG 24 hr tablet    Sig: Take 1 tablet (50 mg total) by mouth daily. Take with or immediately following a meal.    Dispense:  30 tablet    Refill:   2    Orders Placed This Encounter  Procedures   PCV MYOCARDIAL PERFUSION WITH LEXISCAN    Standing Status:   Future    Standing Expiration Date:   07/10/2021    Recommendations:   PRAKRITI CARIGNAN is a 75 y.o. Patient with hyperlipidemia, chronic back pain, underwent anterior lateral interbody fusion of L2-3 and 3 4 with posterior spinal fixation on 02/27/2021, hospital stay was complicated by GI bleed from colitis, she also had UTI and tachycardia related to above-mentioned conditions.  Patient made an appointment to see Janice Pace as her heart rate has been >100 and continues to have marked shortness of breath was recommended by home PT to call Janice Pace.  She has also noticed shortness of breath even doing routine activities at home, has had occasional episodes of orthopnea as well.  She underwent echocardiogram, however VQ scan was not performed.  Fortunately the echocardiogram does not reveal any RV strain, LV systolic function is completely normal.  I will make a stat appointment for VQ scan to exclude small PE that could potentially explain her resting sinus tachycardia and worsening dyspnea although she does have chronic bronchitis as a diagnosis due to prior history of tobacco use.  Her blood pressure continues remain elevated, in 2018 when she had a treadmill stress test, she also had hypertensive blood pressure response.  Suspect she may have primary hypertension.  In view of palpitations, sinus tachycardia, I started her on metoprolol succinate 50 mg daily.  She does have significant cardiovascular risk factors for CAD that could also explain sinus tachycardia and dyspnea, I will set her up for a Lexiscan nuclear stress test, patient unable to do treadmill stress test due to dyspnea.  Would like to see her back in 4 to 6 weeks for follow-up.          Adrian Prows, MD, [facility] 05/13/2021, 5:48 PM Office: [phone]

## 2021-05-16 ENCOUNTER — Telehealth: Payer: Self-pay | Admitting: Cardiology

## 2021-05-16 DIAGNOSIS — R000 Tachycardia, unspecified: Secondary | ICD-10-CM

## 2021-05-16 DIAGNOSIS — R0609 Other forms of dyspnea: Secondary | ICD-10-CM

## 2021-05-16 DIAGNOSIS — Z9889 Other specified postprocedural states: Secondary | ICD-10-CM

## 2021-05-16 NOTE — Telephone Encounter (Signed)
No chief complaint on file.   Patient was denied VQ scan, insurance is willing to pay for CT angiogram of the chest to rule out PE.  Test ordered.      ICD-10-CM   1. Sinus tachycardia  R00.0 CT Angio Chest Pulmonary Embolism (PE) W or WO Contrast    2. Dyspnea on exertion  R06.09 CT Angio Chest Pulmonary Embolism (PE) W or WO Contrast    3. Post-operative state  Z98.890 CT Angio Chest Pulmonary Embolism (PE) W or WO Contrast      Orders Placed This Encounter  Procedures   CT Angio Chest Pulmonary Embolism (PE) W or WO Contrast    Standing Status:   Future    Standing Expiration Date:   06/15/2021    Order Specific Question:   If indicated for the ordered procedure, I authorize the administration of contrast media per Radiology protocol    Answer:   Yes    Order Specific Question:   Preferred imaging location?    Answer:   [id number]    Order Specific Question:   Radiology Contrast Protocol - do NOT remove file path    Answer:   [email or web address]     Adrian Prows, MD, [facility] 05/16/2021, 12:10 PM Office: [phone] Fax: [phone] Pager: [phone]

## 2021-05-17 ENCOUNTER — Ambulatory Visit
Admission: RE | Admit: 2021-05-17 | Discharge: 2021-05-17 | Disposition: A | Discharge status: Home / Self Care | Payer: Medicare Other | Source: Ambulatory Visit | Attending: Cardiology | Admitting: Cardiology

## 2021-05-17 DIAGNOSIS — R0609 Other forms of dyspnea: Secondary | ICD-10-CM

## 2021-05-17 DIAGNOSIS — R000 Tachycardia, unspecified: Secondary | ICD-10-CM

## 2021-05-17 DIAGNOSIS — Z9889 Other specified postprocedural states: Secondary | ICD-10-CM

## 2021-05-17 MED ORDER — IOPAMIDOL (ISOVUE-370) INJECTION 76%
75.0000 mL | Freq: Once | INTRAVENOUS | Status: AC | PRN
  Administered 2021-05-17: 75 mL via INTRAVENOUS

## 2021-05-17 NOTE — Progress Notes (Signed)
CT angio chest 05/17/2021: 1. Negative examination for pulmonary embolism. 2. Diffuse bilateral bronchial wall thickening and mild mosaic attenuation of the airspaces throughout. Findings are consistent with nonspecific infectious or inflammatory bronchitis and small airways disease. 3. Minimal paraseptal emphysema. 4. Background of very fine centrilobular pulmonary nodules, most concentrated at the lung apices, most commonly seen in smoking-related respiratory bronchiolitis. 5. Coronary artery disease.

## 2021-05-20 ENCOUNTER — Other Ambulatory Visit: Payer: Medicare Other

## 2021-05-22 ENCOUNTER — Other Ambulatory Visit: Payer: Medicare Other

## 2021-05-29 ENCOUNTER — Ambulatory Visit: Payer: Medicare Other

## 2021-05-29 ENCOUNTER — Other Ambulatory Visit: Payer: Self-pay

## 2021-05-29 DIAGNOSIS — R0609 Other forms of dyspnea: Secondary | ICD-10-CM

## 2021-05-29 LAB — PCV MYOCARDIAL PERFUSION WITH LEXISCAN
Base ST Depression (mm): 0 mm
ST Depression (mm): 0 mm

## 2021-05-31 ENCOUNTER — Other Ambulatory Visit: Payer: Medicare Other

## 2021-05-31 ENCOUNTER — Other Ambulatory Visit: Payer: Self-pay

## 2021-05-31 DIAGNOSIS — Z13228 Encounter for screening for other metabolic disorders: Secondary | ICD-10-CM

## 2021-05-31 DIAGNOSIS — E559 Vitamin D deficiency, unspecified: Secondary | ICD-10-CM

## 2021-05-31 DIAGNOSIS — Z130 Encounter for screening for diseases of the blood and blood-forming organs and certain disorders involving the immune mechanism: Secondary | ICD-10-CM

## 2021-05-31 DIAGNOSIS — Z0000 Encounter for general adult medical examination without abnormal findings: Secondary | ICD-10-CM

## 2021-06-01 LAB — COMPREHENSIVE METABOLIC PANEL
ALT: 27 IU/L (ref 0–32)
AST: 25 IU/L (ref 0–40)
Albumin/Globulin Ratio: 2 (ref 1.2–2.2)
Albumin: 4.3 g/dL (ref 3.7–4.7)
Alkaline Phosphatase: 121 IU/L (ref 44–121)
BUN/Creatinine Ratio: 11 — ABNORMAL LOW (ref 12–28)
BUN: 8 mg/dL (ref 8–27)
Bilirubin Total: 0.5 mg/dL (ref 0.0–1.2)
CO2: 24 mmol/L (ref 20–29)
Calcium: 9.6 mg/dL (ref 8.7–10.3)
Chloride: 105 mmol/L (ref 96–106)
Creatinine, Ser: 0.76 mg/dL (ref 0.57–1.00)
Globulin, Total: 2.1 g/dL (ref 1.5–4.5)
Glucose: 106 mg/dL — ABNORMAL HIGH (ref 70–99)
Potassium: 5.1 mmol/L (ref 3.5–5.2)
Sodium: 142 mmol/L (ref 134–144)
Total Protein: 6.4 g/dL (ref 6.0–8.5)
eGFR: 82 mL/min/{1.73_m2} (ref >59)

## 2021-06-01 LAB — CBC WITH DIFFERENTIAL/PLATELET
Basophils Absolute: 0 x10E3/uL (ref 0.0–0.2)
Basos: 1 %
EOS (ABSOLUTE): 0.1 x10E3/uL (ref 0.0–0.4)
Eos: 1 %
Hematocrit: 43.4 % (ref 34.0–46.6)
Hemoglobin: 14.4 g/dL (ref 11.1–15.9)
Immature Grans (Abs): 0 x10E3/uL (ref 0.0–0.1)
Immature Granulocytes: 0 %
Lymphocytes Absolute: 2 x10E3/uL (ref 0.7–3.1)
Lymphs: 38 %
MCH: 29.6 pg (ref 26.6–33.0)
MCHC: 33.2 g/dL (ref 31.5–35.7)
MCV: 89 fL (ref 79–97)
Monocytes Absolute: 0.5 x10E3/uL (ref 0.1–0.9)
Monocytes: 9 %
Neutrophils Absolute: 2.7 x10E3/uL (ref 1.4–7.0)
Neutrophils: 51 %
Platelets: 244 x10E3/uL (ref 150–450)
RBC: 4.87 x10E6/uL (ref 3.77–5.28)
RDW: 13 % (ref 11.7–15.4)
WBC: 5.2 x10E3/uL (ref 3.4–10.8)

## 2021-06-01 LAB — TSH: TSH: 1.32 u[IU]/mL (ref 0.450–4.500)

## 2021-06-01 LAB — LIPID PANEL
Chol/HDL Ratio: 4 ratio (ref 0.0–4.4)
Cholesterol, Total: 166 mg/dL (ref 100–199)
HDL: 42 mg/dL (ref >39)
LDL Chol Calc (NIH): 89 mg/dL (ref 0–99)
Triglycerides: 206 mg/dL — ABNORMAL HIGH (ref 0–149)
VLDL Cholesterol Cal: 35 mg/dL (ref 5–40)

## 2021-06-01 LAB — VITAMIN D 25 HYDROXY (VIT D DEFICIENCY, FRACTURES): Vit D, 25-Hydroxy: 24.7 ng/mL — ABNORMAL LOW (ref 30.0–100.0)

## 2021-06-01 LAB — HEMOGLOBIN A1C
Est. average glucose Bld gHb Est-mCnc: 114 mg/dL
Hgb A1c MFr Bld: 5.6 % (ref 4.8–5.6)

## 2021-06-03 ENCOUNTER — Other Ambulatory Visit: Payer: Self-pay

## 2021-06-03 ENCOUNTER — Ambulatory Visit (INDEPENDENT_AMBULATORY_CARE_PROVIDER_SITE_OTHER): Payer: Medicare Other | Admitting: Nurse Practitioner

## 2021-06-03 ENCOUNTER — Encounter: Payer: Self-pay | Admitting: Nurse Practitioner

## 2021-06-03 VITALS — BP 119/66 | HR 70 | Temp 98.0°F | Ht 62.0 in | Wt 188.4 lb

## 2021-06-03 DIAGNOSIS — E7849 Other hyperlipidemia: Secondary | ICD-10-CM

## 2021-06-03 DIAGNOSIS — R0609 Other forms of dyspnea: Secondary | ICD-10-CM | POA: Diagnosis not present

## 2021-06-03 DIAGNOSIS — K219 Gastro-esophageal reflux disease without esophagitis: Secondary | ICD-10-CM

## 2021-06-03 DIAGNOSIS — M48061 Spinal stenosis, lumbar region without neurogenic claudication: Secondary | ICD-10-CM

## 2021-06-03 DIAGNOSIS — E559 Vitamin D deficiency, unspecified: Secondary | ICD-10-CM

## 2021-06-03 DIAGNOSIS — Z6834 Body mass index (BMI) 34.0-34.9, adult: Secondary | ICD-10-CM

## 2021-06-03 DIAGNOSIS — Z0000 Encounter for general adult medical examination without abnormal findings: Secondary | ICD-10-CM | POA: Diagnosis not present

## 2021-06-03 MED ORDER — ROSUVASTATIN CALCIUM 10 MG PO TABS: 10.0000 mg | ORAL_TABLET | Freq: Every day | ORAL | 1 refills | Status: DC

## 2021-06-03 MED ORDER — OMEPRAZOLE 20 MG PO CPDR: 20.0000 mg | DELAYED_RELEASE_CAPSULE | Freq: Every day | ORAL | 1 refills | Status: DC

## 2021-06-03 MED ORDER — ERGOCALCIFEROL 1.25 MG (50000 UT) PO CAPS: 50000.0000 [IU] | ORAL_CAPSULE | ORAL | 5 refills | Status: DC

## 2021-06-03 NOTE — Progress Notes (Signed)
Subjective:   Janice Pace is a 75 y.o. female who presents for Medicare Annual (Subsequent) preventive examination. She states that she has had a chemical stress test and CT chest. CT chest is consistent with chronic bronchitis. There were pulmonary nodules also noted. She does have shortness of breath. This can be worse with exertion. There was also coronary artery disease present on CT. Chemical stress test was essentially normal with normal perfusion.  She has an appointment with her cardiologist to review these studies on 07/10/2021. We have discussed a referral to pulmonology. She would like to discuss with cardiology first and will contact this office if a pulmonary referral is indicated.  She did have routine, fasting labs done. Her triglycerides were a bit elevated, but the// remainder of her lipid panel was normal.  She has no new concerns or complaints today.   Lipid Panel     Component Value Date/Time   CHOL 166 05/31/2021 1046   TRIG 206 (H) 05/31/2021 1046   HDL 42 05/31/2021 1046   CHOLHDL 4.0 05/31/2021 1046   LDLCALC 89 05/31/2021 1046   LABVLDL 35 05/31/2021 1046    The patient states that she does go back to see her surgeon on 06/18/2021. Has been doing water aerobics twice weekly. Would like to increase this if ok with neurosurgeon. Feels like her back is really doing better.      Review of Systems    Review of Systems  Constitutional:  Negative for fever, malaise/fatigue and weight loss.  HENT:  Negative for congestion, ear discharge, ear pain, hearing loss, sinus pain and sore throat.   Eyes: Negative.   Respiratory:  Positive for cough and shortness of breath. Negative for wheezing.   Cardiovascular:  Negative for chest pain and orthopnea.       History of CAD. Does see cardiology on routine basis.   Gastrointestinal:  Negative for abdominal pain, blood in stool, constipation, diarrhea, nausea and vomiting.  Genitourinary:  Negative for dysuria, flank pain,  frequency and urgency.  Musculoskeletal:  Positive for back pain. Negative for myalgias.  Skin:  Negative for itching and rash.  Neurological:  Negative for dizziness, weakness and headaches.  Psychiatric/Behavioral:  Negative for depression. The patient is not nervous/anxious and does not have insomnia.          Objective:   Physical Exam Vitals and nursing note reviewed.  Constitutional:      Appearance: Normal appearance. She is well-developed.  HENT:     Head: Normocephalic and atraumatic.     Right Ear: Tympanic membrane, ear canal and external ear normal.     Left Ear: Tympanic membrane, ear canal and external ear normal.     Nose: Nose normal.     Mouth/Throat:     Mouth: Mucous membranes are moist.  Eyes:     Extraocular Movements: Extraocular movements intact.     Conjunctiva/sclera: Conjunctivae normal.     Pupils: Pupils are equal, round, and reactive to light.  Neck:     Vascular: No carotid bruit.  Cardiovascular:     Rate and Rhythm: Normal rate and regular rhythm.     Pulses: Normal pulses.     Heart sounds: Normal heart sounds.  Pulmonary:     Effort: Pulmonary effort is normal.     Breath sounds: Normal breath sounds.  Abdominal:     General: Bowel sounds are normal. There is no distension.     Palpations: Abdomen is soft. There is no  mass.     Tenderness: There is no abdominal tenderness. There is no guarding or rebound.     Hernia: No hernia is present.  Musculoskeletal:        General: Normal range of motion.     Cervical back: Normal range of motion and neck supple.  Lymphadenopathy:     Cervical: No cervical adenopathy.  Skin:    General: Skin is warm and dry.     Capillary Refill: Capillary refill takes less than 2 seconds.  Neurological:     General: No focal deficit present.     Mental Status: She is alert and oriented to person, place, and time.  Psychiatric:        Mood and Affect: Mood normal.        Behavior: Behavior normal.         Thought Content: Thought content normal.        Judgment: Judgment normal.    Today's Vitals   06/03/21 1107  BP: 119/66  Pulse: 70  Temp: 98 F (36.7 C)  SpO2: 97%  Weight: 188 lb 6.4 oz (85.5 kg)  Height: 5\' 2"  (1.575 m)   Body mass index is 34.46 kg/m.  Advanced Directives 03/02/2021 02/19/2021 09/02/2011  Does Patient Have a Medical Advance Directive? Yes Yes Patient does not have advance directive;Patient would like information  Type of Advance Directive [employer];Living will [place];Living will -  Does patient want to make changes to medical advance directive? No - Patient declined - -  Copy of [place] in Chart? Yes - validated most recent copy scanned in chart (See row information) Yes - validated most recent copy scanned in chart (See row information) -  Would patient like information on creating a medical advance directive? - - Advance directive packet given  Pre-existing out of facility DNR order (yellow form or pink MOST form) - - No    Current Medications (verified) Outpatient Encounter Medications as of 06/03/2021  Medication Sig   acetaminophen (TYLENOL) 500 MG tablet Take 1,000 mg by mouth every 6 (six) hours as needed for moderate pain or headache.   albuterol (VENTOLIN HFA) 108 (90 Base) MCG/ACT inhaler Inhale 1-2 puffs into the lungs every 6 (six) hours as needed for wheezing or shortness of breath.   albuterol (VENTOLIN HFA) 108 (90 Base) MCG/ACT inhaler 2 puff as needed   aspirin EC 81 MG tablet Take 81 mg by mouth daily. Swallow whole.   Calcium Carbonate-Vit D-Min (CALCIUM 600+D3 PLUS MINERALS) 600-800 MG-UNIT CHEW 1 tablet with a meal   cyclobenzaprine (FLEXERIL) 5 MG tablet Take 5 mg by mouth See admin instructions. Take 5 mg at bedtime, may take a second 5 mg dose in the middle of the night as needed for spasms   docusate sodium (COLACE) 100 MG capsule Take 500 mg by mouth at bedtime.   ergocalciferol  (DRISDOL) 1.25 MG (50000 UT) capsule Take 1 capsule (50,000 Units total) by mouth once a week.   fexofenadine (ALLEGRA) 180 MG tablet Take 180 mg by mouth daily as needed for allergies or rhinitis.   gabapentin (NEURONTIN) 300 MG capsule Take 1-2 capsules (300-600 mg total) by mouth 3 (three) times daily as needed (Nerve pain).   metoprolol succinate (TOPROL-XL) 50 MG 24 hr tablet Take 1 tablet (50 mg total) by mouth daily. Take with or immediately following a meal.   Misc Natural Products (BLACK CHERRY CONCENTRATE PO) Take 2 capsules by mouth daily.   [  DISCONTINUED] omeprazole (PRILOSEC) 20 MG capsule Take 20 mg by mouth daily before breakfast.   [DISCONTINUED] rosuvastatin (CRESTOR) 10 MG tablet Take 1 tablet (10 mg total) by mouth daily.   omeprazole (PRILOSEC) 20 MG capsule Take 1 capsule (20 mg total) by mouth daily before breakfast.   rosuvastatin (CRESTOR) 10 MG tablet Take 1 tablet (10 mg total) by mouth daily.   No facility-administered encounter medications on file as of 06/03/2021.    Allergies (verified) Sulfa antibiotics, Atorvastatin, and Metformin hcl   History: Past Medical History:  Diagnosis Date   Arthritis    Bronchitis    Cancer ([place])    dermatosivros arcoma and protuverans   Chronic back pain    degenerative disc disease   Constipation    stool softener daily   Constipation    GERD (gastroesophageal reflux disease)    takes Prilosec daily   GERD (gastroesophageal reflux disease)    Heart murmur    Heart valve disorder    Hemorrhoids    History of hiatal hernia    Insomnia    d/t chantix   Joint pain    Nocturia    Personal history of colonic adenoma 03/13/2003   03/13/2003 - 5 mm adenoma   Pre-diabetes    Prediabetes    Past Surgical History:  Procedure Laterality Date   ABDOMINAL HYSTERECTOMY     ANTERIOR LAT LUMBAR FUSION N/A 02/27/2021   Procedure: Anterior lateral interbody fusion - lateral two - lateral three - lateral three - lateral four with  exploration fusion L4-S1;  Surgeon: Kary Kos, MD;  Location: [facility];  Service: Neurosurgery;  Laterality: N/A;   BACK SURGERY  1988   BACK SURGERY  09-03-11   rods,screws x 8    BUNIONECTOMY     bilateral   BUNIONECTOMY     bil feet   COLONOSCOPY     dermatosibrosarcoma protuberans     LAMINECTOMY WITH POSTERIOR LATERAL ARTHRODESIS LEVEL 3 N/A 02/27/2021   Procedure: Posterior augmentation with pedicle screws Latreal one - lateral four - Posterior Lateral and Interbody fusion;  Surgeon: Kary Kos, MD;  Location: [facility];  Service: Neurosurgery;  Laterality: N/A;   PARTIAL HYSTERECTOMY     RECTOCELE REPAIR     Rectum Repair     RESECTION TUMOR WRIST RADICAL     x 2 rt   TUBAL LIGATION  1974   TUBAL LIGATION     WRIST SURGERY     left   Family History  Problem Relation Age of Onset   Leukemia Mother    Anesthesia problems Neg Hx    Hypotension Neg Hx    Malignant hyperthermia Neg Hx    Pseudochol deficiency Neg Hx    Colon cancer Neg Hx    Social History   Socioeconomic History   Marital status: Married    Spouse name: david   Number of children: 3   Years of education: Not on file   Highest education level: Not on file  Occupational History   Occupation: retired  Tobacco Use   Smoking status: Former    Packs/day: 1.00    Years: 50.00    Pack years: 50.00    Types: Cigarettes    Quit date: 08/17/2011    Years since quitting: 9.8   Smokeless tobacco: Never  Vaping Use   Vaping Use: Never used  Substance and Sexual Activity   Alcohol use: No    Comment: occasional wine   Drug use: No  Sexual activity: Yes    Birth control/protection: Surgical, Other-see comments  Other Topics Concern   Not on file  Social History Narrative   ** Merged History Encounter **       Social Determinants of Health   Financial Resource Strain: Not on file  Food Insecurity: Not on file  Transportation Needs: Not on file  Physical Activity: Not on file  Stress: Not on file   Social Connections: Not on file    Tobacco Counseling Former smoker    Diabetic?no      Activities of Daily Living In your present state of health, do you have any difficulty performing the following activities: 06/03/2021 05/07/2021  Hearing? Y N  Vision? N N  Difficulty concentrating or making decisions? N N  Walking or climbing stairs? N N  Dressing or bathing? N N  Doing errands, shopping? N N  Some recent data might be hidden    Patient Care Team: Lorrene Reid, PA-C as PCP - General (Physician Assistant) Pcp, No Briscoe Deutscher, DO (Family Medicine)  Indicate any recent Medical Services you may have received from other than [facility] providers in the past year (date may be approximate).     Assessment:   1. Encounter for Medicare annual wellness exam Annual Medicare wellness visit today.  2. Dyspnea on exertion Reviewed recent CT scan results with patient.  It shows chronic bronchitis and pulmonary nodules also noted. Discussed a referral to pulmonology. Patient wishes to wait on this for now. Would like to discuss with cardiology first.   3. Other hyperlipidemia Reviewed recent lab results with patient.  Did show mild elevation of triglycerides.  Currently on rosuvastatin 10 mg daily.  Continue as prescribed.  Reviewed importance of following a low fat, low-cholesterol diet.  Continue with increased exercise. - rosuvastatin (CRESTOR) 10 MG tablet; Take 1 tablet (10 mg total) by mouth daily.  Dispense: 90 tablet; Refill: 1  4. Gastroesophageal reflux disease without esophagitis Will continue omeprazole 20 mg daily.  Prescription refilled today. - omeprazole (PRILOSEC) 20 MG capsule; Take 1 capsule (20 mg total) by mouth daily before breakfast.  Dispense: 90 capsule; Refill: 1  5. Vitamin D deficiency Vitamin D deficiency.  Take Drisdol 50,000 international units weekly for next months. - ergocalciferol (DRISDOL) 1.25 MG (50000 UT) capsule; Take 1 capsule (50,000  Units total) by mouth once a week.  Dispense: 4 capsule; Refill: 5  6. Spinal stenosis of lumbar region, unspecified whether neurogenic claudication present Patient should continue to see neurosurgery as scheduled.  Hearing/Vision screen No results found.  Dietary issues and exercise activities discussed:     Goals Addressed   None   Depression Screen PHQ 2/9 Scores 06/03/2021 05/07/2021 08/11/2019  PHQ - 2 Score 0 0 2  PHQ- 9 Score 2 1 8     Fall Risk Fall Risk  06/03/2021 05/07/2021  Falls in the past year? 0 0  Number falls in past yr: 0 0  Injury with Fall? 0 0  Follow up Falls evaluation completed Falls evaluation completed    [place]:  Any stairs in or around the home? No  If so, are there any without handrails? Yes  Home free of loose throw rugs in walkways, pet beds, electrical cords, etc? Yes  Adequate lighting in your home to reduce risk of falls? Yes   ASSISTIVE DEVICES UTILIZED TO PREVENT FALLS:  Life alert? No  Use of a cane, walker or w/c? No  Grab bars  in the bathroom? No  Shower chair or bench in shower? No  Elevated toilet seat or a handicapped toilet? No   TIMED UP AND GO:  Was the test performed? Yes .  Length of time to ambulate 10 feet: 10 sec.   Gait steady and fast without use of assistive device  Cognitive Function:     6CIT Screen 06/03/2021  What time? 0 points  Count back from 20 0 points  Months in reverse 0 points  Repeat phrase 2 points    Immunizations Immunization History  Administered Date(s) Administered   Fluad Quad(high Dose 65+) 05/07/2021   PFIZER(Purple Top)SARS-COV-2 Vaccination 08/04/2019, 08/24/2019   Zoster Recombinat (Shingrix) 07/22/2018, 10/12/2018    TDAP status: Up to date  Flu Vaccine status: Up to date  Pneumococcal vaccine status: Due, Education has been provided regarding the importance of this vaccine. Advised may receive this vaccine at local pharmacy or  Health Dept. Aware to provide a copy of the vaccination record if obtained from local pharmacy or Health Dept. Verbalized acceptance and understanding.  Covid-19 vaccine status: Completed vaccines  Qualifies for Shingles Vaccine? Yes   Zostavax completed Yes   Shingrix Completed?: Yes  Screening Tests Health Maintenance  Topic Date Due   Pneumonia Vaccine 35+ Years old (1 - PCV) Never done   FOOT EXAM  Never done   OPHTHALMOLOGY EXAM  Never done   URINE MICROALBUMIN  Never done   Hepatitis C Screening  Never done   MAMMOGRAM  Never done   DEXA SCAN  Never done   COVID-19 Vaccine (4 - Booster for Pfizer series) 06/22/2020   HEMOGLOBIN A1C  11/28/2021   COLONOSCOPY (Pts 45-54yrs Insurance coverage will need to be confirmed)  09/06/2022   TETANUS/TDAP  05/08/2026   INFLUENZA VACCINE  Completed   Zoster Vaccines- Shingrix  Completed   HPV VACCINES  Aged Out    Health Maintenance  Health Maintenance Due  Topic Date Due   Pneumonia Vaccine 43+ Years old (1 - PCV) Never done   FOOT EXAM  Never done   OPHTHALMOLOGY EXAM  Never done   URINE MICROALBUMIN  Never done   Hepatitis C Screening  Never done   MAMMOGRAM  Never done   DEXA SCAN  Never done   COVID-19 Vaccine (4 - Booster for [place] series) 06/22/2020    Colorectal cancer screening: Type of screening: Colonoscopy. Completed 2014. Repeat every 10 years  Mammogram status: Ordered yes. Pt provided with contact info and advised to call to schedule appt.   Bone Density status: Completed yes. Results reflect: Bone density results: OSTEOPENIA. Repeat every 0 years.  Lung Cancer Screening: (Low Dose CT Chest recommended if Age 33-80 years, 30 pack-year currently smoking OR have quit w/in 15years.) does not qualify.   Lung Cancer Screening Referral: no  Additional Screening:  Hepatitis C Screening: does not qualify; Completed n/a  Vision Screening: Recommended annual ophthalmology exams for early detection of glaucoma and  other disorders of the eye. Is the patient up to date with their annual eye exam?  Yes  Who is the provider or what is the name of the office in which the patient attends annual eye exams? Dr. Jake Samples If pt is not established with a provider, would they like to be referred to a provider to establish care? No .   Dental Screening: Recommended annual dental exams for proper oral hygiene  Community Resource Referral / Chronic Care Management: CRR required this visit?  No  CCM required this visit?  No      Plan:     I have personally reviewed and noted the following in the patient's chart:   Medical and social history Use of alcohol, tobacco or illicit drugs  Current medications and supplements including opioid prescriptions.  Functional ability and status Nutritional status Physical activity Advanced directives List of other physicians Hospitalizations, surgeries, and ER visits in previous 12 months Vitals Screenings to include cognitive, depression, and falls Referrals and appointments  In addition, I have reviewed and discussed with patient certain preventive protocols, quality metrics, and best practice recommendations. A written personalized care plan for preventive services as well as general preventive health recommendations were provided to patient.     Leretha Pol, FNP-c     06/09/2021

## 2021-06-09 DIAGNOSIS — R0609 Other forms of dyspnea: Secondary | ICD-10-CM | POA: Insufficient documentation

## 2021-06-09 NOTE — Patient Instructions (Signed)
Fat and Cholesterol Restricted Eating Plan Getting too much fat and cholesterol in your diet may cause health problems. Choosing the right foods helps keep your fat and cholesterol at normal levels. This can keep you from getting certain diseases. Your doctor may recommend an eating plan that includes: Total fat: ______% or less of total calories a day. This is ______g of fat a day. Saturated fat: ______% or less of total calories a day. This is ______g of saturated fat a day. Cholesterol: less than _________mg a day. Fiber: ______g a day. What are tips for following this plan? General tips Work with your doctor to lose weight if you need to. Avoid: Foods with added sugar. Fried foods. Foods with trans fat or partially hydrogenated oils. This includes some margarines and baked goods. If you drink alcohol: Limit how much you have to: 0-1 drink a day for women who are not pregnant. 0-2 drinks a day for men. Know how much alcohol is in a drink. In the [place] one drink equals one 12 oz bottle of beer (355 mL), one 5 oz glass of wine (148 mL), or one 1 oz glass of hard liquor (44 mL). Reading food labels Check food labels for: Trans fats. Partially hydrogenated oils. Saturated fat (g) in each serving. Cholesterol (mg) in each serving. Fiber (g) in each serving. Choose foods with healthy fats, such as: Monounsaturated fats and polyunsaturated fats. These include olive and canola oil, flaxseeds, walnuts, almonds, and seeds. Omega-3 fats. These are found in certain fish, flaxseed oil, and ground flaxseeds. Choose grain products that have whole grains. Look for the word "whole" as the first word in the ingredient list. Cooking Cook foods using low-fat methods. These include baking, boiling, grilling, and broiling. Eat more home-cooked foods. Eat at restaurants and buffets less often. Eat less fast food. Avoid cooking using saturated fats, such as butter, cream, palm oil, palm kernel oil, and  coconut oil. Meal planning  At meals, divide your plate into four equal parts: Fill one-half of your plate with vegetables, green salads, and fruit. Fill one-fourth of your plate with whole grains. Fill one-fourth of your plate with low-fat (lean) protein foods. Eat fish that is high in omega-3 fats at least two times a week. This includes mackerel, tuna, sardines, and salmon. Eat foods that are high in fiber, such as whole grains, beans, apples, pears, berries, broccoli, carrots, peas, and barley. What foods should I eat? Fruits All fresh, canned (in natural juice), or frozen fruits. Vegetables Fresh or frozen vegetables (raw, steamed, roasted, or grilled). Green salads. Grains Whole grains, such as whole wheat or whole grain breads, crackers, cereals, and pasta. Unsweetened oatmeal, bulgur, barley, quinoa, or brown rice. Corn or whole wheat flour tortillas. Meats and other protein foods Ground beef (85% or leaner), grass-fed beef, or beef trimmed of fat. Skinless chicken or turkey. Ground chicken or turkey. Pork trimmed of fat. All fish and seafood. Egg whites. Dried beans, peas, or lentils. Unsalted nuts or seeds. Unsalted canned beans. Nut butters without added sugar or oil. Dairy Low-fat or nonfat dairy products, such as skim or 1% milk, 2% or reduced-fat cheeses, low-fat and fat-free ricotta or cottage cheese, or plain low-fat and nonfat yogurt. Fats and oils Tub margarine without trans fats. Light or reduced-fat mayonnaise and salad dressings. Avocado. Olive, canola, sesame, or safflower oils. The items listed above may not be a complete list of foods and beverages you can eat. Contact a dietitian for more information. What foods   should I avoid? Fruits Canned fruit in heavy syrup. Fruit in cream or butter sauce. Fried fruit. Vegetables Vegetables cooked in cheese, cream, or butter sauce. Fried vegetables. Grains White bread. White pasta. White rice. Cornbread. Bagels, pastries,  and croissants. Crackers and snack foods that contain trans fat and hydrogenated oils. Meats and other protein foods Fatty cuts of meat. Ribs, chicken wings, bacon, sausage, bologna, salami, chitterlings, fatback, hot dogs, bratwurst, and packaged lunch meats. Liver and organ meats. Whole eggs and egg yolks. Chicken and turkey with skin. Fried meat. Dairy Whole or 2% milk, cream, half-and-half, and cream cheese. Whole milk cheeses. Whole-fat or sweetened yogurt. Full-fat cheeses. Nondairy creamers and whipped toppings. Processed cheese, cheese spreads, and cheese curds. Fats and oils Butter, stick margarine, lard, shortening, ghee, or bacon fat. Coconut, palm kernel, and palm oils. Beverages Alcohol. Sugar-sweetened drinks such as sodas, lemonade, and fruit drinks. Sweets and desserts Corn syrup, sugars, honey, and molasses. Candy. Jam and jelly. Syrup. Sweetened cereals. Cookies, pies, cakes, donuts, muffins, and ice cream. The items listed above may not be a complete list of foods and beverages you should avoid. Contact a dietitian for more information. Summary Choosing the right foods helps keep your fat and cholesterol at normal levels. This can keep you from getting certain diseases. At meals, fill one-half of your plate with vegetables, green salads, and fruits. Eat high fiber foods, like whole grains, beans, apples, pears, berries, carrots, peas, and barley. Limit added sugar, saturated fats, alcohol, and fried foods. This information is not intended to replace advice given to you by your health care provider. Make sure you discuss any questions you have with your health care provider. Document Revised: 11/09/2020 Document Reviewed: 11/09/2020 Elsevier Patient Education  2022 [employer]  

## 2021-06-18 ENCOUNTER — Other Ambulatory Visit: Payer: Self-pay

## 2021-06-18 ENCOUNTER — Encounter: Payer: Self-pay | Admitting: Cardiology

## 2021-06-18 ENCOUNTER — Telehealth: Payer: Self-pay | Admitting: Physician Assistant

## 2021-06-18 ENCOUNTER — Ambulatory Visit: Payer: Medicare Other | Admitting: Cardiology

## 2021-06-18 VITALS — BP 132/83 | HR 78 | Temp 98.0°F | Resp 17 | Ht 62.0 in | Wt 190.4 lb

## 2021-06-18 DIAGNOSIS — R0609 Other forms of dyspnea: Secondary | ICD-10-CM

## 2021-06-18 DIAGNOSIS — R000 Tachycardia, unspecified: Secondary | ICD-10-CM

## 2021-06-18 DIAGNOSIS — J439 Emphysema, unspecified: Secondary | ICD-10-CM

## 2021-06-18 NOTE — Telephone Encounter (Signed)
Patient called to let you know she went and seen her cardiologist and they are sending her to see a pulmonologist and will keep you informed.

## 2021-06-18 NOTE — Progress Notes (Signed)
Primary Physician/Referring:  Lorrene Reid, PA-C  Patient ID: Janice Pace, female    DOB: 03/18/1946, 75 y.o.   MRN: [id number]  Chief Complaint  Patient presents with   Palpitations   6 weeks   HPI:    Janice Pace  is a 75 y.o. Patient with hyperlipidemia, chronic back pain, underwent anterior lateral interbody fusion of L2-3 and 3 4 with posterior spinal fixation on 02/27/2021, hospital stay was complicated by GI bleed from colitis, she also had UTI and tachycardia related to above-mentioned conditions.    Patient had made appointment to see our office 04/04/2021 due to heart rate >100 bpm and worsening dyspnea on exertion.  Patient subsequently underwentEchocardiogram, stress test, and CTA of the chest.  Stress test was overall low risk.  Echocardiogram noted normal LVEF with grade 1 diastolic dysfunction, moderate LVH, otherwise no significant abnormalities.  CTA of the chest was negative for PE, however did show bronchitis and pulmonary nodules.  At last office visit given tachycardia patient was started on metoprolol succinate 50 mg p.o. daily.  Patient is tolerating metoprolol without issue.  Tachycardia has resolved and blood pressure is well controlled.  She states her dyspnea on exertion has improved since last office visit, however she does continue to have symptoms.  Patient was recently seen by her PCP who recommended referral to pulmonology to follow-up on recent CTA findings, agree with referral.   Patient denies chest pain, dizziness, syncope, near syncope, orthopnea, PND, leg edema.  Past Medical History:  Diagnosis Date   Arthritis    Bronchitis    Cancer ([place])    dermatosivros arcoma and protuverans   Chronic back pain    degenerative disc disease   Constipation    stool softener daily   Constipation    GERD (gastroesophageal reflux disease)    takes Prilosec daily   GERD (gastroesophageal reflux disease)    Heart murmur    Heart valve disorder     Hemorrhoids    History of hiatal hernia    Insomnia    d/t chantix   Joint pain    Nocturia    Personal history of colonic adenoma 03/13/2003   03/13/2003 - 5 mm adenoma   Pre-diabetes    Prediabetes    Past Surgical History:  Procedure Laterality Date   ABDOMINAL HYSTERECTOMY     ANTERIOR LAT LUMBAR FUSION N/A 02/27/2021   Procedure: Anterior lateral interbody fusion - lateral two - lateral three - lateral three - lateral four with exploration fusion L4-S1;  Surgeon: Kary Kos, MD;  Location: [facility];  Service: Neurosurgery;  Laterality: N/A;   BACK SURGERY  1988   BACK SURGERY  09-03-11   rods,screws x 8    BUNIONECTOMY     bilateral   BUNIONECTOMY     bil feet   COLONOSCOPY     dermatosibrosarcoma protuberans     LAMINECTOMY WITH POSTERIOR LATERAL ARTHRODESIS LEVEL 3 N/A 02/27/2021   Procedure: Posterior augmentation with pedicle screws Latreal one - lateral four - Posterior Lateral and Interbody fusion;  Surgeon: Kary Kos, MD;  Location: [facility];  Service: Neurosurgery;  Laterality: N/A;   PARTIAL HYSTERECTOMY     RECTOCELE REPAIR     Rectum Repair     RESECTION TUMOR WRIST RADICAL     x 2 rt   TUBAL LIGATION  1974   TUBAL LIGATION     WRIST SURGERY     left   Family History  Problem Relation  Age of Onset   Leukemia Mother    Anesthesia problems Neg Hx    Hypotension Neg Hx    Malignant hyperthermia Neg Hx    Pseudochol deficiency Neg Hx    Colon cancer Neg Hx     Social History   Tobacco Use   Smoking status: Former    Packs/day: 1.00    Years: 50.00    Pack years: 50.00    Types: Cigarettes    Quit date: 08/17/2011    Years since quitting: 9.8   Smokeless tobacco: Never  Substance Use Topics   Alcohol use: No    Comment: occasional wine   Marital Status: Married  ROS  Review of Systems  Constitutional: Negative for malaise/fatigue.  Cardiovascular:  Positive for dyspnea on exertion (improving). Negative for chest pain, leg swelling, near-syncope and  palpitations (resolved).  Musculoskeletal:  Negative for back pain.  Gastrointestinal:  Negative for abdominal pain, heartburn, hematochezia and melena.  Objective  Blood pressure 132/83, pulse 78, temperature 98 F (36.7 C), temperature source Temporal, resp. rate 17, height 5\' 2"  (1.575 m), weight 190 lb 6.4 oz (86.4 kg), SpO2 97 %. Body mass index is 34.82 kg/m.  Vitals with BMI 06/18/2021 06/03/2021 05/10/2021  Height 5\' 2"  5\' 2"  5\' 2"   Weight 190 lbs 6 oz 188 lbs 6 oz 188 lbs 6 oz  BMI 34.82 88.41 66.06  Systolic 301 601 093  Diastolic 83 66 86  Pulse 78 70 96     Physical Exam Vitals reviewed.  Constitutional:      General: She is not in acute distress.    Appearance: She is obese.  Neck:     Vascular: No carotid bruit or JVD.  Cardiovascular:     Rate and Rhythm: Normal rate and regular rhythm.     Pulses: Intact distal pulses.     Heart sounds: No murmur heard.   Gallop present. S4 sounds present.  Pulmonary:     Effort: Pulmonary effort is normal.     Breath sounds: Normal breath sounds.  Musculoskeletal:     Right lower leg: No edema.     Left lower leg: No edema.     Laboratory examination:   Recent Labs    03/06/21 1633 03/07/21 0305 03/08/21 0818 05/31/21 1046  NA 138 137 134* 142  K 3.2* 3.3* 3.1* 5.1  CL 107 107 103 105  CO2 24 23 23 24   GLUCOSE 118* 108* 91 106*  BUN 9 8 6* 8  CREATININE 0.67 0.59 0.60 0.76  CALCIUM 8.1* 8.0* 7.8* 9.6  GFRNONAA >60 >60 >60  --    estimated creatinine clearance is 62.9 mL/min (by C-G formula based on SCr of 0.76 mg/dL).  CMP Latest Ref Rng & Units 05/31/2021 03/08/2021 03/07/2021  Glucose 70 - 99 mg/dL 106(H) 91 108(H)  BUN 8 - 27 mg/dL 8 6(L) 8  Creatinine 0.57 - 1.00 mg/dL 0.76 0.60 0.59  Sodium 134 - 144 mmol/L 142 134(L) 137  Potassium 3.5 - 5.2 mmol/L 5.1 3.1(L) 3.3(L)  Chloride 96 - 106 mmol/L 105 103 107  CO2 20 - 29 mmol/L 24 23 23   Calcium 8.7 - 10.3 mg/dL 9.6 7.8(L) 8.0(L)  Total Protein 6.0 - 8.5  g/dL 6.4 - -  Total Bilirubin 0.0 - 1.2 mg/dL 0.5 - -  Alkaline Phos 44 - 121 IU/L 121 - -  AST 0 - 40 IU/L 25 - -  ALT 0 - 32 IU/L 27 - -   CBC  Latest Ref Rng & Units 05/31/2021 03/01/2021 02/19/2021  WBC 3.4 - 10.8 x10E3/uL 5.2 11.7(H) 6.8  Hemoglobin 11.1 - 15.9 g/dL 14.4 12.5 14.0  Hematocrit 34.0 - 46.6 % 43.4 36.2 40.7  Platelets 150 - 450 x10E3/uL 244 157 197   Lipid Panel Recent Labs    05/31/21 1046  CHOL 166  TRIG 206*  LDLCALC 89  HDL 42  CHOLHDL 4.0    Lipid Panel     Component Value Date/Time   CHOL 166 05/31/2021 1046   TRIG 206 (H) 05/31/2021 1046   HDL 42 05/31/2021 1046   CHOLHDL 4.0 05/31/2021 1046   LDLCALC 89 05/31/2021 1046   LABVLDL 35 05/31/2021 1046     HEMOGLOBIN A1C Lab Results  Component Value Date   HGBA1C 5.6 05/31/2021   MPG 119.76 02/19/2021   TSH Recent Labs    05/31/21 1046  TSH 1.320    External labs:   Cholesterol, total 136.000 m 06/13/2020 HDL 53.000 mg 06/13/2020 LDL 65.000 mg 06/13/2020 Triglycerides 99.000 mg 06/13/2020  TSH 2.100 10/03/2019  Allergies   Allergies  Allergen Reactions   Sulfa Antibiotics Hives and Itching   Atorvastatin Other (See Comments)    mucsle aches   Metformin Hcl Other (See Comments)    joint pains and muscle aches    Medication prior to this encounter:   Outpatient Medications Prior to Visit  Medication Sig Dispense Refill   acetaminophen (TYLENOL) 500 MG tablet Take 1,000 mg by mouth every 6 (six) hours as needed for moderate pain or headache.     albuterol (VENTOLIN HFA) 108 (90 Base) MCG/ACT inhaler Inhale 1-2 puffs into the lungs every 6 (six) hours as needed for wheezing or shortness of breath.     ascorbic acid (VITAMIN C) 1000 MG tablet Take 1 tablet by mouth daily.     aspirin 81 MG EC tablet Take 1 tablet by mouth daily.     Calcium Carbonate-Vit D-Min (CALCIUM 600+D3 PLUS MINERALS) 600-800 MG-UNIT CHEW 1 tablet with a meal     cyclobenzaprine (FLEXERIL) 5 MG tablet Take 5 mg  by mouth See admin instructions. Take 5 mg at bedtime, may take a second 5 mg dose in the middle of the night as needed for spasms     docusate sodium (COLACE) 100 MG capsule Take 500 mg by mouth at bedtime.     ergocalciferol (DRISDOL) 1.25 MG (50000 UT) capsule Take 1 capsule (50,000 Units total) by mouth once a week. 4 capsule 5   fexofenadine (ALLEGRA) 180 MG tablet Take 180 mg by mouth daily as needed for allergies or rhinitis.     gabapentin (NEURONTIN) 300 MG capsule Take 1-2 capsules (300-600 mg total) by mouth 3 (three) times daily as needed (Nerve pain). 180 capsule 2   ibuprofen (ADVIL) 200 MG tablet Take 1 tablet by mouth as needed.     metoprolol succinate (TOPROL-XL) 50 MG 24 hr tablet Take 1 tablet (50 mg total) by mouth daily. Take with or immediately following a meal. 30 tablet 2   Misc Natural Products (BLACK CHERRY CONCENTRATE PO) Take 2 capsules by mouth daily.     omeprazole (PRILOSEC) 20 MG capsule Take 1 capsule (20 mg total) by mouth daily before breakfast. 90 capsule 1   rosuvastatin (CRESTOR) 10 MG tablet Take 1 tablet (10 mg total) by mouth daily. 90 tablet 1   albuterol (VENTOLIN HFA) 108 (90 Base) MCG/ACT inhaler 2 puff as needed     aspirin EC 81 MG  tablet Take 81 mg by mouth daily. Swallow whole.     Biotin 10 MG CAPS Take 1 capsule by mouth See admin instructions.     No facility-administered medications prior to visit.    Medication list after today's encounter   Current Outpatient Medications  Medication Instructions   acetaminophen (TYLENOL) 1,000 mg, Oral, Every 6 hours PRN   albuterol (VENTOLIN HFA) 108 (90 Base) MCG/ACT inhaler 1-2 puffs, Inhalation, Every 6 hours PRN   ascorbic acid (VITAMIN C) 1000 MG tablet 1 tablet, Oral, Daily   aspirin 81 MG EC tablet 1 tablet, Oral, Daily   Calcium Carbonate-Vit D-Min (CALCIUM 600+D3 PLUS MINERALS) 600-800 MG-UNIT CHEW 1 tablet with a meal   cyclobenzaprine (FLEXERIL) 5 mg, Oral, See admin instructions, Take 5 mg  at bedtime, may take a second 5 mg dose in the middle of the night as needed for spasms   docusate sodium (COLACE) 500 mg, Oral, Daily at bedtime   ergocalciferol (DRISDOL) 50,000 Units, Oral, Weekly   fexofenadine (ALLEGRA) 180 mg, Oral, Daily PRN   gabapentin (NEURONTIN) 300-600 mg, Oral, 3 times daily PRN   ibuprofen (ADVIL) 200 MG tablet 1 tablet, Oral, As needed   metoprolol succinate (TOPROL-XL) 50 mg, Oral, Daily, Take with or immediately following a meal.   Misc Natural Products (BLACK CHERRY CONCENTRATE PO) 2 capsules, Oral, Daily   omeprazole (PRILOSEC) 20 mg, Oral, Daily before breakfast   rosuvastatin (CRESTOR) 10 mg, Oral, Daily   Radiology:   CT scan of the abdomen 03/07/2021: 1. Dilated and gas-filled portions of colon and small bowel with contrast transiting to the colon. Changes likely represent adynamic ileus. 2. Bilateral adrenal gland nodules, likely benign adenomas. No follow-up is indicated. 3. Fatty infiltration of the liver. 4. Atelectasis in the lung bases. 5. Aortic atherosclerosis. 6. Postoperative changes in the lumbar spine.  CXR PA/LAT 04/03/2021: Few densities at the left lung base appear chronic and could represent a small amount of scarring. Otherwise, the lungs are clear. Heart and mediastinum are within normal limits. Spinal hardware in the upper lumbar spine. No pleural effusions.  CTA chest PE 05/17/2021: 1. Negative examination for pulmonary embolism. 2. Diffuse bilateral bronchial wall thickening and mild mosaic attenuation of the airspaces throughout. Findings are consistent with nonspecific infectious or inflammatory bronchitis and small airways disease. 3. Minimal paraseptal emphysema. 4. Background of very fine centrilobular pulmonary nodules, most concentrated at the lung apices, most commonly seen in smoking-related respiratory bronchiolitis. 5. Coronary artery disease. Aortic Atherosclerosis and Emphysema  Cardiac Studies:    Treadmill exercise stress test 06/01/2017: Indication: Dyspnea. Resting EKG demonstrates Normal sinus rhythm. The patient exercised on Bruce protocol for  6:00 min. Patient 7.05METS and reached HR 135   bpm, which is   90% of maximum age-predicted HR.  Stress test terminated due to Fatigue and dyspnea and achieved 90% of MPHR for age. Hypertensive at rest and normal response with stress. Rest BP  168/94 mm Hg. Chest Pain: none.  Arrhythmias: none. ST Changes: With peak exercise there was no ST-T changes of ischemia. Overall Impression: Normal stress test. Exercise capacity was below average for age.  PCV ECHOCARDIOGRAM COMPLETE 04/16/2021 Left ventricle cavity is normal in size. Moderate concentric hypertrophy of the left ventricle. Normal global wall motion. Normal LV systolic function with visual EF 55-60%. Doppler evidence of grade I (impaired) diastolic dysfunction, normal LAP. Trace TR. No evidence of pulmonary hypertension.   PCV MYOCARDIAL PERFUSION WITH LEXISCAN 05/29/2021 Nondiagnostic ECG stress. Myocardial perfusion is  normal. LV is normal in small size in both rest and stress images. Overall LV systolic function is normal without regional wall motion abnormalities. Stress LV EF: 47%. Visually LVEF is normal and may be underestimated due to low volume LV. No previous exam available for comparison. Low risk.   EKG:   EKG 04/04/2021: Sinus tachycardia at rate of 106 bpm, incomplete right bundle branch block.  Normal EKG.      Assessment     ICD-10-CM   1. Dyspnea on exertion  R06.09 Ambulatory referral to Pulmonology    2. Sinus tachycardia  R00.0     3. Pulmonary emphysema, unspecified emphysema type ([place])  J43.9        Medications Discontinued During This Encounter  Medication Reason   aspirin EC 81 MG tablet Duplicate   albuterol (VENTOLIN HFA) 108 (90 Base) MCG/ACT inhaler    Biotin 10 MG CAPS      No orders of the defined types were placed in this  encounter.   Orders Placed This Encounter  Procedures   Ambulatory referral to Pulmonology    Referral Priority:   Routine    Referral Type:   Consultation    Referral Reason:   Specialty Services Required    Requested Specialty:   Pulmonary Disease    Number of Visits Requested:   1    Recommendations:   BELMA DYCHES is a 75 y.o. Patient with hyperlipidemia, chronic back pain, underwent anterior lateral interbody fusion of L2-3 and 3 4 with posterior spinal fixation on 02/27/2021, hospital stay was complicated by GI bleed from colitis, she also had UTI and tachycardia related to above-mentioned conditions.    Patient had made appointment to see our office 04/04/2021 due to heart rate >100 bpm and worsening dyspnea on exertion.  Patient subsequently underwentEchocardiogram, stress test, and CTA of the chest.  Stress test was overall low risk.  Echocardiogram noted normal LVEF with grade 1 diastolic dysfunction, moderate LVH, otherwise no significant abnormalities.  CTA of the chest was negative for PE, however did show bronchitis and pulmonary nodules.  Reviewed and discussed results of echocardiogram, stress test, and CTA with patient and her husband who is present at bedside.  All questions were addressed.  Cardiac work-up is overall reassuring that dyspnea is not related to cardiac etiology, rather suspect underlying lung disease to be contributing to patient's symptoms.  Recommend referral to pulmonology for further evaluation.  There is no evidence of ischemic heart disease or heart failure.  Patient's palpitations/tachycardia have resolved with addition of metoprolol which she is tolerating without issue, will therefore continue this.  Commend patient continue primary prevention from a cardiac standpoint.  CTA did note aortic and coronary atherosclerosis, therefore continue on therapy.  Follow-up in 6 months, sooner if needed, for hyperlipidemia, hypertension, tachycardia, dyspnea on  exertion.   Alethia Berthold, PA-C 06/18/2021, 3:29 PM Office: [phone]

## 2021-06-20 ENCOUNTER — Other Ambulatory Visit: Payer: Self-pay

## 2021-06-20 ENCOUNTER — Ambulatory Visit: Payer: Medicare Other | Admitting: Pulmonary Disease

## 2021-06-20 ENCOUNTER — Encounter: Payer: Self-pay | Admitting: Pulmonary Disease

## 2021-06-20 VITALS — BP 126/82 | HR 86 | Ht 62.0 in | Wt 190.0 lb

## 2021-06-20 DIAGNOSIS — J449 Chronic obstructive pulmonary disease, unspecified: Secondary | ICD-10-CM

## 2021-06-20 MED ORDER — FLUTICASONE FUROATE-VILANTEROL 100-25 MCG/ACT IN AEPB: 1.0000 | INHALATION_SPRAY | Freq: Every day | RESPIRATORY_TRACT | 6 refills | Status: DC

## 2021-06-20 NOTE — Progress Notes (Signed)
Synopsis: Referred in December 2022 for   Subjective:   PATIENT ID: Janice Pace GENDER: female DOB: 24-Nov-1945, MRN: [id number]  HPI  Chief Complaint  Patient presents with   Consult    Referred by Dr. Einar Gip for Janice Pace. Per patient and husband, she developed the [place] after her surgery back in August 2022.    Janice Pace is a 75 year old woman, former smoker with GERD chronic back pain s/panterior lateral interbody fusion of L2-3 and 3 4 with posterior spinal fixation on 02/27/2021 who is referred to pulmonary clinic for shortness of breath.   She reports having shortness of breath since her back surgery in August.  The dyspnea is worse with exertion.  She has intermittent cough and wheezing.  She had intermittent wheezing prior to the surgery.  Otherwise she denies significant dyspnea before her hospitalization as she was doing water aerobics on a regular basis.  She has resumed water aerobics but is going a couple days of the week and is noticing that her stamina is improving.  She has been evaluated by Dr. Christen Butter of cardiology with normal exercise stress test and no ischemic changes but reduced exercise capacity for her age.  Echocardiogram 04/16/2021 showed moderate concentric LVH with LVEF 55 to 60%.  Grade 1 diastolic dysfunction.  Trace tricuspid regurgitation.  She is a former smoker with 50+ pack year smoking history.  She smoked from age [age].   Past Medical History:  Diagnosis Date   Arthritis    Bronchitis    Cancer ([place])    dermatosivros arcoma and protuverans   Chronic back pain    degenerative disc disease   Constipation    stool softener daily   Constipation    GERD (gastroesophageal reflux disease)    takes Prilosec daily   GERD (gastroesophageal reflux disease)    Heart murmur    Heart valve disorder    Hemorrhoids    History of hiatal hernia    Insomnia    d/t chantix   Joint pain    Nocturia    Personal history of colonic adenoma 03/13/2003    03/13/2003 - 5 mm adenoma   Pre-diabetes    Prediabetes      Family History  Problem Relation Age of Onset   Leukemia Mother    Anesthesia problems Neg Hx    Hypotension Neg Hx    Malignant hyperthermia Neg Hx    Pseudochol deficiency Neg Hx    Colon cancer Neg Hx      Social History   Socioeconomic History   Marital status: Married    Spouse name: david   Number of children: 3   Years of education: Not on file   Highest education level: Not on file  Occupational History   Occupation: retired  Tobacco Use   Smoking status: Former    Packs/day: 1.00    Years: 50.00    Pack years: 50.00    Types: Cigarettes    Quit date: 08/17/2011    Years since quitting: 9.8   Smokeless tobacco: Never  Vaping Use   Vaping Use: Never used  Substance and Sexual Activity   Alcohol use: No    Comment: occasional wine   Drug use: No   Sexual activity: Yes    Birth control/protection: Surgical, Other-see comments  Other Topics Concern   Not on file  Social History Narrative   ** Merged History Encounter **       Social Determinants of  Health   Financial Resource Strain: Not on file  Food Insecurity: Not on file  Transportation Needs: Not on file  Physical Activity: Not on file  Stress: Not on file  Social Connections: Not on file  Intimate Partner Violence: Not on file     Allergies  Allergen Reactions   Sulfa Antibiotics Hives and Itching   Atorvastatin Other (See Comments)    mucsle aches   Metformin Hcl Other (See Comments)    joint pains and muscle aches     Outpatient Medications Prior to Visit  Medication Sig Dispense Refill   acetaminophen (TYLENOL) 500 MG tablet Take 1,000 mg by mouth every 6 (six) hours as needed for moderate pain or headache.     albuterol (VENTOLIN HFA) 108 (90 Base) MCG/ACT inhaler Inhale 1-2 puffs into the lungs every 6 (six) hours as needed for wheezing or shortness of breath.     ascorbic acid (VITAMIN C) 1000 MG tablet Take 1 tablet by  mouth daily.     aspirin 81 MG EC tablet Take 1 tablet by mouth daily.     Calcium Carbonate-Vit D-Min (CALCIUM 600+D3 PLUS MINERALS) 600-800 MG-UNIT CHEW 1 tablet with a meal     cyclobenzaprine (FLEXERIL) 5 MG tablet Take 5 mg by mouth See admin instructions. Take 5 mg at bedtime, may take a second 5 mg dose in the middle of the night as needed for spasms     docusate sodium (COLACE) 100 MG capsule Take 500 mg by mouth at bedtime.     ergocalciferol (DRISDOL) 1.25 MG (50000 UT) capsule Take 1 capsule (50,000 Units total) by mouth once a week. 4 capsule 5   fexofenadine (ALLEGRA) 180 MG tablet Take 180 mg by mouth daily as needed for allergies or rhinitis.     gabapentin (NEURONTIN) 300 MG capsule Take 1-2 capsules (300-600 mg total) by mouth 3 (three) times daily as needed (Nerve pain). 180 capsule 2   ibuprofen (ADVIL) 200 MG tablet Take 1 tablet by mouth as needed.     metoprolol succinate (TOPROL-XL) 50 MG 24 hr tablet Take 1 tablet (50 mg total) by mouth daily. Take with or immediately following a meal. 30 tablet 2   Misc Natural Products (BLACK CHERRY CONCENTRATE PO) Take 2 capsules by mouth daily.     omeprazole (PRILOSEC) 20 MG capsule Take 1 capsule (20 mg total) by mouth daily before breakfast. 90 capsule 1   rosuvastatin (CRESTOR) 10 MG tablet Take 1 tablet (10 mg total) by mouth daily. 90 tablet 1   No facility-administered medications prior to visit.   Review of Systems  Constitutional:  Negative for chills, fever, malaise/fatigue and weight loss.  HENT:  Negative for congestion, sinus pain and sore throat.   Eyes: Negative.   Respiratory:  Positive for shortness of breath and wheezing. Negative for cough, hemoptysis and sputum production.   Cardiovascular:  Negative for chest pain, palpitations, orthopnea, claudication and leg swelling.  Gastrointestinal:  Positive for heartburn. Negative for abdominal pain, nausea and vomiting.  Genitourinary: Negative.   Musculoskeletal:   Negative for joint pain and myalgias.  Skin:  Negative for rash.  Neurological:  Negative for weakness.  Endo/Heme/Allergies: Negative.   Psychiatric/Behavioral: Negative.     Objective:   Vitals:   06/20/21 1527  BP: 126/82  Pulse: 86  SpO2: 99%  Weight: 190 lb (86.2 kg)  Height: 5\' 2"  (1.575 m)   Physical Exam Constitutional:      General: She is not in  acute distress.    Appearance: She is not ill-appearing.  HENT:     Head: Normocephalic and atraumatic.  Eyes:     General: No scleral icterus.    Conjunctiva/sclera: Conjunctivae normal.     Pupils: Pupils are equal, round, and reactive to light.  Cardiovascular:     Rate and Rhythm: Normal rate and regular rhythm.     Pulses: Normal pulses.     Heart sounds: Normal heart sounds. No murmur heard. Pulmonary:     Effort: Pulmonary effort is normal.     Breath sounds: Decreased air movement present. Decreased breath sounds present. No wheezing, rhonchi or rales.  Abdominal:     General: Bowel sounds are normal.     Palpations: Abdomen is soft.  Musculoskeletal:     Right lower leg: No edema.     Left lower leg: No edema.  Lymphadenopathy:     Cervical: No cervical adenopathy.  Skin:    General: Skin is warm and dry.  Neurological:     General: No focal deficit present.     Mental Status: She is alert.  Psychiatric:        Mood and Affect: Mood normal.        Behavior: Behavior normal.        Thought Content: Thought content normal.        Judgment: Judgment normal.   CBC    Component Value Date/Time   WBC 5.2 05/31/2021 1046   WBC 11.7 (H) 03/01/2021 0903   RBC 4.87 05/31/2021 1046   RBC 3.87 03/01/2021 0903   HGB 14.4 05/31/2021 1046   HCT 43.4 05/31/2021 1046   PLT 244 05/31/2021 1046   MCV 89 05/31/2021 1046   MCH 29.6 05/31/2021 1046   MCH 32.3 03/01/2021 0903   MCHC 33.2 05/31/2021 1046   MCHC 34.5 03/01/2021 0903   RDW 13.0 05/31/2021 1046   LYMPHSABS 2.0 05/31/2021 1046   MONOABS 1.4 (H)  03/01/2021 0903   EOSABS 0.1 05/31/2021 1046   BASOSABS 0.0 05/31/2021 1046   BMP Latest Ref Rng & Units 05/31/2021 03/08/2021 03/07/2021  Glucose 70 - 99 mg/dL 106(H) 91 108(H)  BUN 8 - 27 mg/dL 8 6(L) 8  Creatinine 0.57 - 1.00 mg/dL 0.76 0.60 0.59  BUN/Creat Ratio 12 - 28 11(L) - -  Sodium 134 - 144 mmol/L 142 134(L) 137  Potassium 3.5 - 5.2 mmol/L 5.1 3.1(L) 3.3(L)  Chloride 96 - 106 mmol/L 105 103 107  CO2 20 - 29 mmol/L 24 23 23   Calcium 8.7 - 10.3 mg/dL 9.6 7.8(L) 8.0(L)   Chest imaging: CTA Chest 05/17/21 1. Negative examination for pulmonary embolism. 2. Diffuse bilateral bronchial wall thickening and mild mosaic attenuation of the airspaces throughout. Findings are consistent with nonspecific infectious or inflammatory bronchitis and small airways disease. 3. Minimal paraseptal emphysema. 4. Background of very fine centrilobular pulmonary nodules, most concentrated at the lung apices, most commonly seen in smoking-related respiratory bronchiolitis. 5. Coronary artery disease.  PFT: No flowsheet data found.  Labs:  Path:  Echo 04/16/21: Left ventricle cavity is normal in size. Moderate concentric hypertrophy  of the left ventricle. Normal global wall motion. Normal LV systolic  function with visual EF 55-60%. Doppler evidence of grade I (impaired)  diastolic dysfunction, normal LAP.  Trace TR.  No evidence of pulmonary hypertension.  Heart Catheterization:  Assessment & Plan:   Chronic obstructive pulmonary disease, unspecified COPD type ([place]) - Plan: fluticasone furoate-vilanterol (BREO ELLIPTA) 100-25 MCG/ACT AEPB, Pulmonary  function test  Discussion: Alixandra Alfieri is a 75 year old woman, former smoker with GERD chronic back pain s/panterior lateral interbody fusion of L2-3 and 3 4 with posterior spinal fixation on 02/27/2021 who is referred to pulmonary clinic for shortness of breath.   There is concern for obstructive lung disease that could be leading to  patient's exertional shortness of breath.  There is also a component of deconditioning from her extended hospitalization.  We will trial her on Breo Ellipta 100-25 MCG 1 puff daily and monitor for any improvement in her symptoms.  We will check pulmonary function tests at follow-up visit.  Follow-up in 6 weeks.  Freda Jackson, MD [facility] Office: [phone]   Current Outpatient Medications:    acetaminophen (TYLENOL) 500 MG tablet, Take 1,000 mg by mouth every 6 (six) hours as needed for moderate pain or headache., Disp: , Rfl:    albuterol (VENTOLIN HFA) 108 (90 Base) MCG/ACT inhaler, Inhale 1-2 puffs into the lungs every 6 (six) hours as needed for wheezing or shortness of breath., Disp: , Rfl:    ascorbic acid (VITAMIN C) 1000 MG tablet, Take 1 tablet by mouth daily., Disp: , Rfl:    aspirin 81 MG EC tablet, Take 1 tablet by mouth daily., Disp: , Rfl:    Calcium Carbonate-Vit D-Min (CALCIUM 600+D3 PLUS MINERALS) 600-800 MG-UNIT CHEW, 1 tablet with a meal, Disp: , Rfl:    cyclobenzaprine (FLEXERIL) 5 MG tablet, Take 5 mg by mouth See admin instructions. Take 5 mg at bedtime, may take a second 5 mg dose in the middle of the night as needed for spasms, Disp: , Rfl:    docusate sodium (COLACE) 100 MG capsule, Take 500 mg by mouth at bedtime., Disp: , Rfl:    ergocalciferol (DRISDOL) 1.25 MG (50000 UT) capsule, Take 1 capsule (50,000 Units total) by mouth once a week., Disp: 4 capsule, Rfl: 5   fexofenadine (ALLEGRA) 180 MG tablet, Take 180 mg by mouth daily as needed for allergies or rhinitis., Disp: , Rfl:    fluticasone furoate-vilanterol (BREO ELLIPTA) 100-25 MCG/ACT AEPB, Inhale 1 puff into the lungs daily., Disp: 28 each, Rfl: 6   gabapentin (NEURONTIN) 300 MG capsule, Take 1-2 capsules (300-600 mg total) by mouth 3 (three) times daily as needed (Nerve pain)., Disp: 180 capsule, Rfl: 2   ibuprofen (ADVIL) 200 MG tablet, Take 1 tablet by mouth as needed., Disp: ,  Rfl:    metoprolol succinate (TOPROL-XL) 50 MG 24 hr tablet, Take 1 tablet (50 mg total) by mouth daily. Take with or immediately following a meal., Disp: 30 tablet, Rfl: 2   Misc Natural Products (BLACK CHERRY CONCENTRATE PO), Take 2 capsules by mouth daily., Disp: , Rfl:    omeprazole (PRILOSEC) 20 MG capsule, Take 1 capsule (20 mg total) by mouth daily before breakfast., Disp: 90 capsule, Rfl: 1   rosuvastatin (CRESTOR) 10 MG tablet, Take 1 tablet (10 mg total) by mouth daily., Disp: 90 tablet, Rfl: 1

## 2021-06-20 NOTE — Patient Instructions (Signed)
I am concerned you may have COPD.  We will start you on breo ellipta 1 puff daily and monitor for improvement in your symptoms - rinse mouth out with water after each use  Follow up in 6 weeks with pulmonary function tests

## 2021-06-24 ENCOUNTER — Other Ambulatory Visit (HOSPITAL_COMMUNITY): Payer: Self-pay

## 2021-06-24 ENCOUNTER — Telehealth: Payer: Self-pay | Admitting: Pulmonary Disease

## 2021-06-24 NOTE — Telephone Encounter (Signed)
Ran test claim returned as $47 copay. Called pt and she reports that would put her in a financial hardship due to her paying medical bills from a recent procedure. Is there assist available for the Breo inhaler, or sample that she is able to obtain? Pt reports if she has to pay the $47 she will but it would be a financial strain. Please follow up with pt, as PA is not needed at this time.

## 2021-06-24 NOTE — Telephone Encounter (Signed)
No Breo samples. We have stiolto and spiriva.   Will forward message to Dr Erin Fulling for recommendations. I will go ahead and mail her a copy of the patient assistance application.   JD please advise. Pharmacy has ran test claim medication is $47. Patient states she is unable to afford this. No samples of Breo. Thanks :)  Patient made aware application will be mailed and she will need a print out from her pharmacy regarding the $600 out of pocket fee.

## 2021-06-24 NOTE — Telephone Encounter (Signed)
Routing to correct pool for f/u.

## 2021-06-24 NOTE — Telephone Encounter (Signed)
Noted.   Will route message to pharmacy team to run test claim.

## 2021-06-25 ENCOUNTER — Other Ambulatory Visit (HOSPITAL_COMMUNITY): Payer: Self-pay

## 2021-06-25 NOTE — Telephone Encounter (Signed)
Called and spoke with patient. She verbalized understanding and stated that her husband went ahead and purchased the [facility] yesterday. She can not apply for patient assistance at this time because she has not spent $600 OOP.   Reminded her of her appt next month with JD.   Nothing further needed at time of call.

## 2021-06-25 NOTE — Telephone Encounter (Signed)
Freddi Starr, MD  P Rx Prior Auth Team; P Lbpu Triage Pool Caller: Unspecified (Yesterday,  9:21 AM) Thank you.   Triage team, please let patient know that Breo at $47 is her best option. She can obtain 1 month of inhaler treatment to see if she receives benefit before we continue her on it longer term.   Thanks,  Marcene Brawn the pt and she was not available  LMTCB

## 2021-06-25 NOTE — Telephone Encounter (Signed)
ATC LVMTCB X1  

## 2021-06-25 NOTE — Telephone Encounter (Signed)
Patient is returning phone call. Patient phone number is [phone].

## 2021-06-25 NOTE — Telephone Encounter (Signed)
Unfortunately $47 is the standard co-pay for that class of medications (ICS/LABA) [facility] returned a co-pay of $100

## 2021-07-10 ENCOUNTER — Ambulatory Visit: Payer: Medicare Other | Admitting: Cardiology

## 2021-08-01 LAB — HM MAMMOGRAPHY

## 2021-08-02 ENCOUNTER — Encounter: Payer: Self-pay | Admitting: Physician Assistant

## 2021-08-07 ENCOUNTER — Ambulatory Visit: Payer: Medicare Other | Admitting: Pulmonary Disease

## 2021-08-07 ENCOUNTER — Encounter: Payer: Self-pay | Admitting: Pulmonary Disease

## 2021-08-07 ENCOUNTER — Other Ambulatory Visit: Payer: Self-pay

## 2021-08-07 VITALS — BP 128/80 | HR 63 | Ht 62.0 in | Wt 189.0 lb

## 2021-08-07 DIAGNOSIS — J449 Chronic obstructive pulmonary disease, unspecified: Secondary | ICD-10-CM | POA: Diagnosis not present

## 2021-08-07 NOTE — Patient Instructions (Addendum)
Continue breo ellipta 1 puff daily  Use albuterol as needed every 4-6 hours for cough,shortness of breath or wheezing  We will schedule you for pulmonary function tests and a 6 minute walk test prior to filling out form for handicap placard.  Follow up in 6 months

## 2021-08-07 NOTE — Progress Notes (Signed)
Synopsis: Referred in December 2022 for   Subjective:   PATIENT ID: Janice Pace GENDER: female DOB: 02/26/1946, MRN: [id number]  HPI  Chief Complaint  Patient presents with   Follow-up    6 wk f/u. States the Memory Dance has helped with her breathing.    Janice Pace is a 76 year old woman, former smoker with GERD chronic back pain s/p anterior lateral interbody fusion of L2-3 and 3 4 with posterior spinal fixation on 02/27/2021 who returns to pulmonary clinic for shortness of breath.   She reports her breathing is much better after starting Breo Ellipta 100-25 MCG 1 puff daily.  She is not noticing the cough and wheezing like before.  She reports her shortness of breath has improved as well.  She is accompanied by her husband today.  They are planning a motorcycle trip out [place] in June on their [place].  OV 06/20/21 She reports having shortness of breath since her back surgery in August.  The dyspnea is worse with exertion.  She has intermittent cough and wheezing.  She had intermittent wheezing prior to the surgery.  Otherwise she denies significant dyspnea before her hospitalization as she was doing water aerobics on a regular basis.  She has resumed water aerobics but is going a couple days of the week and is noticing that her stamina is improving.  She has been evaluated by Dr. Christen Butter of cardiology with normal exercise stress test and no ischemic changes but reduced exercise capacity for her age.  Echocardiogram 04/16/2021 showed moderate concentric LVH with LVEF 55 to 60%.  Grade 1 diastolic dysfunction.  Trace tricuspid regurgitation.  She is a former smoker with 50+ pack year smoking history.  She smoked from age [age].   Past Medical History:  Diagnosis Date   Arthritis    Bronchitis    Cancer ([place])    dermatosivros arcoma and protuverans   Chronic back pain    degenerative disc disease   Constipation    stool softener daily   Constipation    GERD (gastroesophageal  reflux disease)    takes Prilosec daily   GERD (gastroesophageal reflux disease)    Heart murmur    Heart valve disorder    Hemorrhoids    History of hiatal hernia    Insomnia    d/t chantix   Joint pain    Nocturia    Personal history of colonic adenoma 03/13/2003   03/13/2003 - 5 mm adenoma   Pre-diabetes    Prediabetes      Family History  Problem Relation Age of Onset   Leukemia Mother    Anesthesia problems Neg Hx    Hypotension Neg Hx    Malignant hyperthermia Neg Hx    Pseudochol deficiency Neg Hx    Colon cancer Neg Hx      Social History   Socioeconomic History   Marital status: Married    Spouse name: david   Number of children: 3   Years of education: Not on file   Highest education level: Not on file  Occupational History   Occupation: retired  Tobacco Use   Smoking status: Former    Packs/day: 1.00    Years: 50.00    Pack years: 50.00    Types: Cigarettes    Quit date: 08/17/2011    Years since quitting: 10.0   Smokeless tobacco: Never  Vaping Use   Vaping Use: Never used  Substance and Sexual Activity   Alcohol use:  No    Comment: occasional wine   Drug use: No   Sexual activity: Yes    Birth control/protection: Surgical, Other-see comments  Other Topics Concern   Not on file  Social History Narrative   ** Merged History Encounter **       Social Determinants of Health   Financial Resource Strain: Not on file  Food Insecurity: Not on file  Transportation Needs: Not on file  Physical Activity: Not on file  Stress: Not on file  Social Connections: Not on file  Intimate Partner Violence: Not on file     Allergies  Allergen Reactions   Sulfa Antibiotics Hives and Itching   Atorvastatin Other (See Comments)    mucsle aches   Metformin Hcl Other (See Comments)    joint pains and muscle aches     Outpatient Medications Prior to Visit  Medication Sig Dispense Refill   acetaminophen (TYLENOL) 500 MG tablet Take 1,000 mg by mouth  every 6 (six) hours as needed for moderate pain or headache.     albuterol (VENTOLIN HFA) 108 (90 Base) MCG/ACT inhaler Inhale 1-2 puffs into the lungs every 6 (six) hours as needed for wheezing or shortness of breath.     ascorbic acid (VITAMIN C) 1000 MG tablet Take 1 tablet by mouth daily.     aspirin 81 MG EC tablet Take 1 tablet by mouth daily.     Calcium Carbonate-Vit D-Min (CALCIUM 600+D3 PLUS MINERALS) 600-800 MG-UNIT CHEW 1 tablet with a meal     cyclobenzaprine (FLEXERIL) 5 MG tablet Take 5 mg by mouth See admin instructions. Take 5 mg at bedtime, may take a second 5 mg dose in the middle of the night as needed for spasms     docusate sodium (COLACE) 100 MG capsule Take 500 mg by mouth at bedtime.     ergocalciferol (DRISDOL) 1.25 MG (50000 UT) capsule Take 1 capsule (50,000 Units total) by mouth once a week. 4 capsule 5   fexofenadine (ALLEGRA) 180 MG tablet Take 180 mg by mouth daily as needed for allergies or rhinitis.     fluticasone furoate-vilanterol (BREO ELLIPTA) 100-25 MCG/ACT AEPB Inhale 1 puff into the lungs daily. 28 each 6   gabapentin (NEURONTIN) 300 MG capsule Take 1-2 capsules (300-600 mg total) by mouth 3 (three) times daily as needed (Nerve pain). 180 capsule 2   ibuprofen (ADVIL) 200 MG tablet Take 1 tablet by mouth as needed.     Misc Natural Products (BLACK CHERRY CONCENTRATE PO) Take 2 capsules by mouth daily.     omeprazole (PRILOSEC) 20 MG capsule Take 1 capsule (20 mg total) by mouth daily before breakfast. 90 capsule 1   rosuvastatin (CRESTOR) 10 MG tablet Take 1 tablet (10 mg total) by mouth daily. 90 tablet 1   metoprolol succinate (TOPROL-XL) 50 MG 24 hr tablet Take 1 tablet (50 mg total) by mouth daily. Take with or immediately following a meal. 30 tablet 2   No facility-administered medications prior to visit.   Review of Systems  Constitutional:  Negative for chills, fever, malaise/fatigue and weight loss.  HENT:  Negative for congestion, sinus pain and  sore throat.   Eyes: Negative.   Respiratory:  Negative for cough, hemoptysis, sputum production, shortness of breath and wheezing.   Cardiovascular:  Negative for chest pain, palpitations, orthopnea, claudication and leg swelling.  Gastrointestinal:  Positive for heartburn. Negative for abdominal pain, nausea and vomiting.  Genitourinary: Negative.   Musculoskeletal:  Negative for joint  pain and myalgias.  Skin:  Negative for rash.  Neurological:  Negative for weakness.  Endo/Heme/Allergies: Negative.   Psychiatric/Behavioral: Negative.     Objective:   Vitals:   08/07/21 1021  BP: 128/80  Pulse: 63  SpO2: 98%  Weight: 189 lb (85.7 kg)  Height: 5\' 2"  (1.575 m)   Physical Exam Constitutional:      General: She is not in acute distress.    Appearance: She is not ill-appearing.  HENT:     Head: Normocephalic and atraumatic.  Eyes:     General: No scleral icterus.    Conjunctiva/sclera: Conjunctivae normal.     Pupils: Pupils are equal, round, and reactive to light.  Cardiovascular:     Rate and Rhythm: Normal rate and regular rhythm.     Pulses: Normal pulses.     Heart sounds: Normal heart sounds. No murmur heard. Pulmonary:     Effort: Pulmonary effort is normal.     Breath sounds: No decreased air movement. No decreased breath sounds, wheezing, rhonchi or rales.  Musculoskeletal:     Right lower leg: No edema.     Left lower leg: No edema.  Skin:    General: Skin is warm and dry.  Neurological:     General: No focal deficit present.     Mental Status: She is alert.  Psychiatric:        Mood and Affect: Mood normal.        Behavior: Behavior normal.        Thought Content: Thought content normal.        Judgment: Judgment normal.   CBC    Component Value Date/Time   WBC 5.2 05/31/2021 1046   WBC 11.7 (H) 03/01/2021 0903   RBC 4.87 05/31/2021 1046   RBC 3.87 03/01/2021 0903   HGB 14.4 05/31/2021 1046   HCT 43.4 05/31/2021 1046   PLT 244 05/31/2021 1046    MCV 89 05/31/2021 1046   MCH 29.6 05/31/2021 1046   MCH 32.3 03/01/2021 0903   MCHC 33.2 05/31/2021 1046   MCHC 34.5 03/01/2021 0903   RDW 13.0 05/31/2021 1046   LYMPHSABS 2.0 05/31/2021 1046   MONOABS 1.4 (H) 03/01/2021 0903   EOSABS 0.1 05/31/2021 1046   BASOSABS 0.0 05/31/2021 1046   BMP Latest Ref Rng & Units 05/31/2021 03/08/2021 03/07/2021  Glucose 70 - 99 mg/dL 106(H) 91 108(H)  BUN 8 - 27 mg/dL 8 6(L) 8  Creatinine 0.57 - 1.00 mg/dL 0.76 0.60 0.59  BUN/Creat Ratio 12 - 28 11(L) - -  Sodium 134 - 144 mmol/L 142 134(L) 137  Potassium 3.5 - 5.2 mmol/L 5.1 3.1(L) 3.3(L)  Chloride 96 - 106 mmol/L 105 103 107  CO2 20 - 29 mmol/L 24 23 23   Calcium 8.7 - 10.3 mg/dL 9.6 7.8(L) 8.0(L)   Chest imaging: CTA Chest 05/17/21 1. Negative examination for pulmonary embolism. 2. Diffuse bilateral bronchial wall thickening and mild mosaic attenuation of the airspaces throughout. Findings are consistent with nonspecific infectious or inflammatory bronchitis and small airways disease. 3. Minimal paraseptal emphysema. 4. Background of very fine centrilobular pulmonary nodules, most concentrated at the lung apices, most commonly seen in smoking-related respiratory bronchiolitis. 5. Coronary artery disease.  PFT: No flowsheet data found.  Labs:  Path:  Echo 04/16/21: Left ventricle cavity is normal in size. Moderate concentric hypertrophy  of the left ventricle. Normal global wall motion. Normal LV systolic  function with visual EF 55-60%. Doppler evidence of grade I (impaired)  diastolic dysfunction, normal LAP.  Trace TR.  No evidence of pulmonary hypertension.  Heart Catheterization:  Assessment & Plan:   Chronic obstructive pulmonary disease, unspecified COPD type ([place]) - Plan: 6 minute walk  Discussion: Judea Fennimore is a 76 year old woman, former smoker with GERD chronic back pain s/p anterior lateral interbody fusion of L2-3 and 3 4 with posterior spinal fixation on  02/27/2021 who returns to pulmonary clinic for shortness of breath.   Her shortness of breath that appears related to obstructive lung disease as she has responded to ICS/LABA inhaler therapy.  He is to continue Breo Ellipta 100-25 MCG 1 puff daily.  We will obtain pulmonary function tests and a 6-minute walk test in the future.  She is requesting a handicap placard due to limitations in her ability to walk long distances.  Follow-up in 6 months.  Freda Jackson, MD [facility] Office: [phone]   Current Outpatient Medications:    acetaminophen (TYLENOL) 500 MG tablet, Take 1,000 mg by mouth every 6 (six) hours as needed for moderate pain or headache., Disp: , Rfl:    albuterol (VENTOLIN HFA) 108 (90 Base) MCG/ACT inhaler, Inhale 1-2 puffs into the lungs every 6 (six) hours as needed for wheezing or shortness of breath., Disp: , Rfl:    ascorbic acid (VITAMIN C) 1000 MG tablet, Take 1 tablet by mouth daily., Disp: , Rfl:    aspirin 81 MG EC tablet, Take 1 tablet by mouth daily., Disp: , Rfl:    Calcium Carbonate-Vit D-Min (CALCIUM 600+D3 PLUS MINERALS) 600-800 MG-UNIT CHEW, 1 tablet with a meal, Disp: , Rfl:    cyclobenzaprine (FLEXERIL) 5 MG tablet, Take 5 mg by mouth See admin instructions. Take 5 mg at bedtime, may take a second 5 mg dose in the middle of the night as needed for spasms, Disp: , Rfl:    docusate sodium (COLACE) 100 MG capsule, Take 500 mg by mouth at bedtime., Disp: , Rfl:    ergocalciferol (DRISDOL) 1.25 MG (50000 UT) capsule, Take 1 capsule (50,000 Units total) by mouth once a week., Disp: 4 capsule, Rfl: 5   fexofenadine (ALLEGRA) 180 MG tablet, Take 180 mg by mouth daily as needed for allergies or rhinitis., Disp: , Rfl:    fluticasone furoate-vilanterol (BREO ELLIPTA) 100-25 MCG/ACT AEPB, Inhale 1 puff into the lungs daily., Disp: 28 each, Rfl: 6   gabapentin (NEURONTIN) 300 MG capsule, Take 1-2 capsules (300-600 mg total) by mouth 3 (three)  times daily as needed (Nerve pain)., Disp: 180 capsule, Rfl: 2   ibuprofen (ADVIL) 200 MG tablet, Take 1 tablet by mouth as needed., Disp: , Rfl:    Misc Natural Products (BLACK CHERRY CONCENTRATE PO), Take 2 capsules by mouth daily., Disp: , Rfl:    omeprazole (PRILOSEC) 20 MG capsule, Take 1 capsule (20 mg total) by mouth daily before breakfast., Disp: 90 capsule, Rfl: 1   rosuvastatin (CRESTOR) 10 MG tablet, Take 1 tablet (10 mg total) by mouth daily., Disp: 90 tablet, Rfl: 1   estradiol (CLIMARA - DOSED IN MG/24 HR) 0.025 mg/24hr patch, Place 1 patch (0.025 mg total) onto the skin once a week., Disp: 4 patch, Rfl: 12   metoprolol succinate (TOPROL-XL) 50 MG 24 hr tablet, Take 1 tablet (50 mg total) by mouth daily. Take with or immediately following a meal., Disp: 30 tablet, Rfl: 2

## 2021-08-13 ENCOUNTER — Other Ambulatory Visit: Payer: Self-pay

## 2021-08-13 ENCOUNTER — Ambulatory Visit (INDEPENDENT_AMBULATORY_CARE_PROVIDER_SITE_OTHER): Payer: Medicare Other | Admitting: Physician Assistant

## 2021-08-13 ENCOUNTER — Encounter: Payer: Self-pay | Admitting: Physician Assistant

## 2021-08-13 ENCOUNTER — Telehealth: Payer: Self-pay | Admitting: Cardiology

## 2021-08-13 VITALS — BP 137/78 | HR 68 | Temp 98.2°F | Ht 62.0 in | Wt 188.0 lb

## 2021-08-13 DIAGNOSIS — K760 Fatty (change of) liver, not elsewhere classified: Secondary | ICD-10-CM | POA: Insufficient documentation

## 2021-08-13 DIAGNOSIS — N951 Menopausal and female climacteric states: Secondary | ICD-10-CM | POA: Diagnosis not present

## 2021-08-13 DIAGNOSIS — M47817 Spondylosis without myelopathy or radiculopathy, lumbosacral region: Secondary | ICD-10-CM | POA: Insufficient documentation

## 2021-08-13 DIAGNOSIS — R000 Tachycardia, unspecified: Secondary | ICD-10-CM

## 2021-08-13 DIAGNOSIS — J301 Allergic rhinitis due to pollen: Secondary | ICD-10-CM | POA: Insufficient documentation

## 2021-08-13 DIAGNOSIS — E785 Hyperlipidemia, unspecified: Secondary | ICD-10-CM | POA: Insufficient documentation

## 2021-08-13 DIAGNOSIS — E279 Disorder of adrenal gland, unspecified: Secondary | ICD-10-CM | POA: Insufficient documentation

## 2021-08-13 DIAGNOSIS — L821 Other seborrheic keratosis: Secondary | ICD-10-CM | POA: Diagnosis not present

## 2021-08-13 DIAGNOSIS — F411 Generalized anxiety disorder: Secondary | ICD-10-CM | POA: Insufficient documentation

## 2021-08-13 DIAGNOSIS — F419 Anxiety disorder, unspecified: Secondary | ICD-10-CM | POA: Insufficient documentation

## 2021-08-13 DIAGNOSIS — D3501 Benign neoplasm of right adrenal gland: Secondary | ICD-10-CM | POA: Insufficient documentation

## 2021-08-13 DIAGNOSIS — R002 Palpitations: Secondary | ICD-10-CM

## 2021-08-13 DIAGNOSIS — J45909 Unspecified asthma, uncomplicated: Secondary | ICD-10-CM | POA: Insufficient documentation

## 2021-08-13 DIAGNOSIS — D3500 Benign neoplasm of unspecified adrenal gland: Secondary | ICD-10-CM | POA: Insufficient documentation

## 2021-08-13 MED ORDER — METOPROLOL SUCCINATE ER 50 MG PO TB24: 50.0000 mg | ORAL_TABLET | Freq: Every day | ORAL | 2 refills | Status: DC

## 2021-08-13 MED ORDER — ESTRADIOL 0.025 MG/24HR TD PTWK: 0.0250 mg | MEDICATED_PATCH | TRANSDERMAL | 12 refills | Status: DC

## 2021-08-13 NOTE — Patient Instructions (Signed)
Seborrheic Keratosis A seborrheic keratosis is a common, noncancerous (benign) skin growth. These growths are velvety, waxy, rough, tan, brown, or black spots that appear on the skin. These skin growths can be flat or raised, andscaly. What are the causes? The cause of this condition is not known. What increases the risk? You are more likely to develop this condition if you: Have a family history of seborrheic keratosis. Are 50 or older. Are pregnant. Have had estrogen replacement therapy. What are the signs or symptoms? Symptoms of this condition include growths on the face, chest, shoulders, back, or other areas. These growths: Are usually painless, but may become irritated and itchy. Can be yellow, brown, black, or other colors. Are slightly raised or have a flat surface. Are sometimes rough or wart-like in texture. Are often velvety or waxy on the surface. Are round or oval-shaped. Often occur in groups, but may occur as a single growth. How is this diagnosed? This condition is diagnosed with a medical history and physical exam. A sample of the growth may be tested (skin biopsy). You may need to see a skin specialist (dermatologist). How is this treated? Treatment is not usually needed for this condition, unless the growths are irritated or bleed often. You may also choose to have the growths removed if you do not like their appearance. Most commonly, these growths are treated with a procedure in which liquid nitrogen is applied to "freeze" off the growth (cryosurgery). They may also be burned off with electricity (electrocautery) or removed by scraping (curettage). Follow these instructions at home: Watch your growth for any changes. Keep all follow-up visits as told by your health care provider. This is important. Do not scratch or pick at the growth or growths. This can cause them to become irritated or infected. Contact a health care provider if: You suddenly have many new  growths. Your growth bleeds, itches, or hurts. Your growth suddenly becomes larger or changes color. Summary A seborrheic keratosis is a common, noncancerous (benign) skin growth. Treatment is not usually needed for this condition, unless the growths are irritated or bleed often. Watch your growth for any changes. Contact a health care provider if you suddenly have many new growths or your growth suddenly becomes larger or changes color. Keep all follow-up visits as told by your health care provider. This is important. This information is not intended to replace advice given to you by your health care provider. Make sure you discuss any questions you have with your healthcare provider. Document Revised: 11/12/2017 Document Reviewed: 11/12/2017 Elsevier Patient Education  2022 [employer]vier Inc.  

## 2021-08-13 NOTE — Telephone Encounter (Signed)
Pt req refill for below medication  metoprolol succinate (TOPROL-XL) 50 MG 24 hr tablet (Expired)

## 2021-08-13 NOTE — Progress Notes (Signed)
Established patient acute visit   Patient: Janice Pace   DOB: 03-06-1946   76 y.o. Female  MRN: [id number] Visit Date: 08/13/2021  Chief Complaint  Patient presents with   Acute Visit    Mole Check under breasts   Subjective    HPI HPI     Acute Visit    Additional comments: Mole Check under breasts      Last edited by Aron Baba, CMA on 08/13/2021 10:36 AM.      Patient presents with c/o 2 spots underneath her breasts which have been present for about a 1 year. Denies changes with lesions. They are non-tender, non-itchy. Patient also as c/o hot flashes and night sweats. Reports in the past was on hormone replacement therapy which helped improve her symptoms and was eventually stopped when she was doing better and symptoms were tolerable. But has been struggling with hot flashes and night sweats, also has been more irritable. Patient has hx of hysterectomy. Denies hx of breast cancer.    Medications: Outpatient Medications Prior to Visit  Medication Sig   acetaminophen (TYLENOL) 500 MG tablet Take 1,000 mg by mouth every 6 (six) hours as needed for moderate pain or headache.   albuterol (VENTOLIN HFA) 108 (90 Base) MCG/ACT inhaler Inhale 1-2 puffs into the lungs every 6 (six) hours as needed for wheezing or shortness of breath.   ascorbic acid (VITAMIN C) 1000 MG tablet Take 1 tablet by mouth daily.   aspirin 81 MG EC tablet Take 1 tablet by mouth daily.   Calcium Carbonate-Vit D-Min (CALCIUM 600+D3 PLUS MINERALS) 600-800 MG-UNIT CHEW 1 tablet with a meal   cyclobenzaprine (FLEXERIL) 5 MG tablet Take 5 mg by mouth See admin instructions. Take 5 mg at bedtime, may take a second 5 mg dose in the middle of the night as needed for spasms   docusate sodium (COLACE) 100 MG capsule Take 500 mg by mouth at bedtime.   ergocalciferol (DRISDOL) 1.25 MG (50000 UT) capsule Take 1 capsule (50,000 Units total) by mouth once a week.   fexofenadine (ALLEGRA) 180 MG tablet Take 180 mg by  mouth daily as needed for allergies or rhinitis.   fluticasone furoate-vilanterol (BREO ELLIPTA) 100-25 MCG/ACT AEPB Inhale 1 puff into the lungs daily.   gabapentin (NEURONTIN) 300 MG capsule Take 1-2 capsules (300-600 mg total) by mouth 3 (three) times daily as needed (Nerve pain).   ibuprofen (ADVIL) 200 MG tablet Take 1 tablet by mouth as needed.   Misc Natural Products (BLACK CHERRY CONCENTRATE PO) Take 2 capsules by mouth daily.   omeprazole (PRILOSEC) 20 MG capsule Take 1 capsule (20 mg total) by mouth daily before breakfast.   rosuvastatin (CRESTOR) 10 MG tablet Take 1 tablet (10 mg total) by mouth daily.   metoprolol succinate (TOPROL-XL) 50 MG 24 hr tablet Take 1 tablet (50 mg total) by mouth daily. Take with or immediately following a meal.   No facility-administered medications prior to visit.    Review of Systems Review of Systems:  A fourteen system review of systems was performed and found to be positive as per HPI.     Objective    BP 137/78    Pulse 68    Temp 98.2 F (36.8 C)    Ht 5\' 2"  (1.575 m)    Wt 188 lb (85.3 kg)    SpO2 100%    BMI 34.39 kg/m    Physical Exam  General:  Well Developed, well nourished, appropriate for  stated age.  Neuro:  Alert and oriented,  extra-ocular muscles intact  HEENT:  Normocephalic, atraumatic, neck supple  Skin:  brown and waxy plaque one underneath each breast Cardiac:  RRR, S1 S2 Respiratory: CTA B/L  Vascular:  Ext warm, no cyanosis apprec.; cap RF less 2 sec. Psych:  No HI/SI, judgement and insight good, Euthymic mood. Full Affect.   No results found for any visits on 08/13/21.  Assessment & Plan     Seborrheic Keratosis: -Reassurance provided lesions are consistent with SK. Provided information.  Menopausal vasomotor syndrome: -Discussed with patient we can trial low dose of hormone therapy with estrogen patch. Discussed potential side effects. Pt verbalized understanding. If symptoms fail to improve or worsen  recommend further evaluation. Labs from 05/31/2021 CBC, TSH and A1c wnl's. -Follow up in 1 month.   Return for f/up in 1 monthf for HRT.        Lorrene Reid, PA-C  [facility] [phone] (phone) [phone] (fax)  [place]

## 2021-08-13 NOTE — Telephone Encounter (Signed)
Ok done

## 2021-08-13 NOTE — Telephone Encounter (Signed)
Refill sent.

## 2021-08-16 ENCOUNTER — Ambulatory Visit: Payer: Medicare Other

## 2021-08-16 ENCOUNTER — Other Ambulatory Visit: Payer: Self-pay

## 2021-08-16 DIAGNOSIS — J449 Chronic obstructive pulmonary disease, unspecified: Secondary | ICD-10-CM

## 2021-08-16 NOTE — Progress Notes (Signed)
Six Minute Walk - 08/16/21 1412       Six Minute Walk   Medications taken before test (dose and time) CLARITIN D at 7:30 am    Supplemental oxygen during test? No    Lap distance in meters  34 meters    Laps Completed 10    Partial lap (in meters) 0 meters    Baseline BP (sitting) 140/70    Baseline Heartrate 66    Baseline Dyspnea (Borg Scale) 0.5    Baseline Fatigue (Borg Scale) 0.5    Baseline SPO2 97 %      End of Test Values    BP (sitting) 144/70    Heartrate 82    Dyspnea (Borg Scale) 3    Fatigue (Borg Scale) 4    SPO2 98 %      2 Minutes Post Walk Values   BP (sitting) 140/70    Heartrate 72    SPO2 98 %    Stopped or paused before six minutes? Yes    Reason: She stopped after the 3rd lap due to shortness of breath and then she stopped again after the 6th lap due to her back hurting she had surgery in August.    Other Symptoms at end of exercise: Hip pain      Interpretation   Distance completed 340 meters    Tech Comments: Patient walked average pace, expressed some shortness of breath and some sweating. Completed 6 minute walk and stopped twice.

## 2021-08-18 ENCOUNTER — Encounter: Payer: Self-pay | Admitting: Pulmonary Disease

## 2021-08-18 ENCOUNTER — Telehealth: Payer: Self-pay | Admitting: Pulmonary Disease

## 2021-08-18 NOTE — Telephone Encounter (Signed)
Hi Janice Pace,  Please let patient know that her 6 minute walk test was abnormal as she has reduced distanced walking distance as compared to the normal reference ranges. She does not require supplemental oxygen.   We can help her fill out a form for a handicap placard so we can fill out our part of the form and mail it to her or they can pick it up at the front desk.  Thanks, JD

## 2021-08-19 NOTE — Telephone Encounter (Signed)
Called and spoke with patient. She verbalized understanding of results. She is currently scheduled for a breathing on 08/28/21 and wishes to pickup the placard then. Will go ahead and fill out the form for Dr. Erin Fulling to sign on 08/26/21.

## 2021-08-28 ENCOUNTER — Ambulatory Visit (INDEPENDENT_AMBULATORY_CARE_PROVIDER_SITE_OTHER): Payer: Medicare Other | Admitting: Pulmonary Disease

## 2021-08-28 ENCOUNTER — Other Ambulatory Visit: Payer: Self-pay

## 2021-08-28 DIAGNOSIS — J449 Chronic obstructive pulmonary disease, unspecified: Secondary | ICD-10-CM

## 2021-08-28 LAB — PULMONARY FUNCTION TEST
DL/VA % pred: 109 %
DL/VA: 4.58 ml/min/mmHg/L
DLCO cor % pred: 112 %
DLCO cor: 20.18 ml/min/mmHg
DLCO unc % pred: 112 %
DLCO unc: 20.18 ml/min/mmHg
FEF 25-75 Post: 2.02 L/sec
FEF 25-75 Pre: 1.62 L/sec
FEF2575-%Change-Post: 24 %
FEF2575-%Pred-Post: 126 %
FEF2575-%Pred-Pre: 101 %
FEV1-%Change-Post: 4 %
FEV1-%Pred-Post: 94 %
FEV1-%Pred-Pre: 90 %
FEV1-Post: 1.86 L
FEV1-Pre: 1.78 L
FEV1FVC-%Change-Post: 0 %
FEV1FVC-%Pred-Pre: 107 %
FEV6-%Change-Post: 5 %
FEV6-%Pred-Post: 93 %
FEV6-%Pred-Pre: 89 %
FEV6-Post: 2.32 L
FEV6-Pre: 2.21 L
FEV6FVC-%Pred-Post: 105 %
FEV6FVC-%Pred-Pre: 105 %
FVC-%Change-Post: 5 %
FVC-%Pred-Post: 88 %
FVC-%Pred-Pre: 84 %
FVC-Post: 2.32 L
FVC-Pre: 2.21 L
Post FEV1/FVC ratio: 80 %
Post FEV6/FVC ratio: 100 %
Pre FEV1/FVC ratio: 80 %
Pre FEV6/FVC Ratio: 100 %
RV % pred: 294 %
RV: 6.39 L
TLC % pred: 186 %
TLC: 8.89 L

## 2021-08-28 NOTE — Patient Instructions (Signed)
Full PFT performed today. °

## 2021-08-28 NOTE — Progress Notes (Signed)
Full PFT performed today. °

## 2021-09-11 ENCOUNTER — Ambulatory Visit: Payer: Medicare Other | Admitting: Physician Assistant

## 2021-09-27 ENCOUNTER — Encounter: Payer: Self-pay | Admitting: Physician Assistant

## 2021-09-27 ENCOUNTER — Ambulatory Visit (INDEPENDENT_AMBULATORY_CARE_PROVIDER_SITE_OTHER): Payer: Medicare Other | Admitting: Physician Assistant

## 2021-09-27 VITALS — BP 127/72 | HR 64 | Temp 97.3°F | Ht 62.0 in | Wt 189.0 lb

## 2021-09-27 DIAGNOSIS — N951 Menopausal and female climacteric states: Secondary | ICD-10-CM | POA: Diagnosis not present

## 2021-09-27 MED ORDER — ESTRADIOL 0.0375 MG/24HR TD PTWK: 0.0375 mg | MEDICATED_PATCH | TRANSDERMAL | 2 refills | Status: DC

## 2021-09-27 NOTE — Progress Notes (Signed)
Established Patient Office Visit  Subjective:  Patient ID: Janice Pace, female    DOB: [birth year]  Age: 76 y.o. MRN: [id number]  CC:  Chief Complaint  Patient presents with   Follow-up    HPI Janice Pace presents for for follow-up on hormone replacement therapy. Patient reports night sweats are better but still has hot flashes. Denies fever, unintentional weight loss, abdominal pain or vaginal bleeding.   Past Medical History:  Diagnosis Date   Arthritis    Bronchitis    Cancer (HCC)    dermatosivros arcoma and protuverans   Chronic back pain    degenerative disc disease   Constipation    stool softener daily   Constipation    GERD (gastroesophageal reflux disease)    takes Prilosec daily   GERD (gastroesophageal reflux disease)    Heart murmur    Heart valve disorder    Hemorrhoids    History of hiatal hernia    Insomnia    d/t chantix   Joint pain    Nocturia    Personal history of colonic adenoma 03/13/2003   03/13/2003 - 5 mm adenoma   Pre-diabetes    Prediabetes     Past Surgical History:  Procedure Laterality Date   ABDOMINAL HYSTERECTOMY     ANTERIOR LAT LUMBAR FUSION N/A 02/27/2021   Procedure: Anterior lateral interbody fusion - lateral two - lateral three - lateral three - lateral four with exploration fusion L4-S1;  Surgeon: Donalee Citrin, MD;  Location: [facility] OR;  Service: Neurosurgery;  Laterality: N/A;   BACK SURGERY  1988   BACK SURGERY  09-03-11   rods,screws x 8    BUNIONECTOMY     bilateral   BUNIONECTOMY     bil feet   COLONOSCOPY     dermatosibrosarcoma protuberans     LAMINECTOMY WITH POSTERIOR LATERAL ARTHRODESIS LEVEL 3 N/A 02/27/2021   Procedure: Posterior augmentation with pedicle screws Latreal one - lateral four - Posterior Lateral and Interbody fusion;  Surgeon: Donalee Citrin, MD;  Location: [facility] OR;  Service: Neurosurgery;  Laterality: N/A;   PARTIAL HYSTERECTOMY     RECTOCELE REPAIR     Rectum Repair     RESECTION TUMOR WRIST RADICAL      x 2 rt   TUBAL LIGATION  1974   TUBAL LIGATION     WRIST SURGERY     left    Family History  Problem Relation Age of Onset   Leukemia Mother    Anesthesia problems Neg Hx    Hypotension Neg Hx    Malignant hyperthermia Neg Hx    Pseudochol deficiency Neg Hx    Colon cancer Neg Hx     Social History   Socioeconomic History   Marital status: Married    Spouse name: david   Number of children: 3   Years of education: Not on file   Highest education level: Not on file  Occupational History   Occupation: retired  Tobacco Use   Smoking status: Former    Packs/day: 1.00    Years: 50.00    Pack years: 50.00    Types: Cigarettes    Quit date: 08/17/2011    Years since quitting: 10.1   Smokeless tobacco: Never  Vaping Use   Vaping Use: Never used  Substance and Sexual Activity   Alcohol use: No    Comment: occasional wine   Drug use: No   Sexual activity: Yes    Birth control/protection: Surgical, Other-see comments  Other Topics Concern   Not on file  Social History Narrative   ** Merged History Encounter **       Social Determinants of Health   Financial Resource Strain: Not on file  Food Insecurity: Not on file  Transportation Needs: Not on file  Physical Activity: Not on file  Stress: Not on file  Social Connections: Not on file  Intimate Partner Violence: Not on file    Outpatient Medications Prior to Visit  Medication Sig Dispense Refill   acetaminophen (TYLENOL) 500 MG tablet Take 1,000 mg by mouth every 6 (six) hours as needed for moderate pain or headache.     albuterol (VENTOLIN HFA) 108 (90 Base) MCG/ACT inhaler Inhale 1-2 puffs into the lungs every 6 (six) hours as needed for wheezing or shortness of breath.     ascorbic acid (VITAMIN C) 1000 MG tablet Take 1 tablet by mouth daily.     aspirin 81 MG EC tablet Take 1 tablet by mouth daily.     Calcium Carbonate-Vit D-Min (CALCIUM 600+D3 PLUS MINERALS) 600-800 MG-UNIT CHEW 1 tablet with a meal      cyclobenzaprine (FLEXERIL) 5 MG tablet Take 5 mg by mouth See admin instructions. Take 5 mg at bedtime, may take a second 5 mg dose in the middle of the night as needed for spasms     docusate sodium (COLACE) 100 MG capsule Take 500 mg by mouth at bedtime.     ergocalciferol (DRISDOL) 1.25 MG (50000 UT) capsule Take 1 capsule (50,000 Units total) by mouth once a week. 4 capsule 5   fexofenadine (ALLEGRA) 180 MG tablet Take 180 mg by mouth daily as needed for allergies or rhinitis.     fluticasone furoate-vilanterol (BREO ELLIPTA) 100-25 MCG/ACT AEPB Inhale 1 puff into the lungs daily. 28 each 6   gabapentin (NEURONTIN) 300 MG capsule Take 1-2 capsules (300-600 mg total) by mouth 3 (three) times daily as needed (Nerve pain). 180 capsule 2   ibuprofen (ADVIL) 200 MG tablet Take 1 tablet by mouth as needed.     metoprolol succinate (TOPROL-XL) 50 MG 24 hr tablet Take 1 tablet (50 mg total) by mouth daily. Take with or immediately following a meal. 30 tablet 2   Misc Natural Products (BLACK CHERRY CONCENTRATE PO) Take 2 capsules by mouth daily.     omeprazole (PRILOSEC) 20 MG capsule Take 1 capsule (20 mg total) by mouth daily before breakfast. 90 capsule 1   rosuvastatin (CRESTOR) 10 MG tablet Take 1 tablet (10 mg total) by mouth daily. 90 tablet 1   estradiol (CLIMARA - DOSED IN MG/24 HR) 0.025 mg/24hr patch Place 1 patch (0.025 mg total) onto the skin once a week. 4 patch 12   No facility-administered medications prior to visit.    Allergies  Allergen Reactions   Sulfa Antibiotics Hives and Itching   Atorvastatin Other (See Comments)    mucsle aches   Metformin Hcl Other (See Comments)    joint pains and muscle aches    ROS Review of Systems A fourteen system review of systems was performed and found to be positive as per HPI.   Objective:    Physical Exam General:  Well Developed, well nourished, appropriate for stated age.  Neuro:  Alert and oriented,  extra-ocular muscles intact   HEENT:  Normocephalic, atraumatic, neck supple  Skin:  no gross rash, warm, pink. Cardiac:  RRR Respiratory: speaking in full sentences, unlabored. Vascular:  Ext warm, no cyanosis apprec.; cap RF  less 2 sec. Psych:  No HI/SI, judgement and insight good, Euthymic mood. Full Affect.  BP 127/72   Pulse 64   Temp (!) 97.3 F (36.3 C)   Ht 5\' 2"  (1.575 m)   Wt 189 lb (85.7 kg)   SpO2 98%   BMI 34.57 kg/m  Wt Readings from Last 3 Encounters:  09/27/21 189 lb (85.7 kg)  08/13/21 188 lb (85.3 kg)  08/07/21 189 lb (85.7 kg)     Health Maintenance Due  Topic Date Due   Hepatitis C Screening  Never done   DEXA SCAN  Never done   COVID-19 Vaccine (4 - Booster for Pfizer series) 06/22/2020    There are no preventive care reminders to display for this patient.  Lab Results  Component Value Date   TSH 1.320 05/31/2021   Lab Results  Component Value Date   WBC 5.2 05/31/2021   HGB 14.4 05/31/2021   HCT 43.4 05/31/2021   MCV 89 05/31/2021   PLT 244 05/31/2021   Lab Results  Component Value Date   NA 142 05/31/2021   K 5.1 05/31/2021   CO2 24 05/31/2021   GLUCOSE 106 (H) 05/31/2021   BUN 8 05/31/2021   CREATININE 0.76 05/31/2021   BILITOT 0.5 05/31/2021   ALKPHOS 121 05/31/2021   AST 25 05/31/2021   ALT 27 05/31/2021   PROT 6.4 05/31/2021   ALBUMIN 4.3 05/31/2021   CALCIUM 9.6 05/31/2021   ANIONGAP 8 03/08/2021   EGFR 82 05/31/2021   Lab Results  Component Value Date   CHOL 166 05/31/2021   Lab Results  Component Value Date   HDL 42 05/31/2021   Lab Results  Component Value Date   LDLCALC 89 05/31/2021   Lab Results  Component Value Date   TRIG 206 (H) 05/31/2021   Lab Results  Component Value Date   CHOLHDL 4.0 05/31/2021   Lab Results  Component Value Date   HGBA1C 5.6 05/31/2021      Assessment & Plan:   Problem List Items Addressed This Visit   None Visit Diagnoses     Menopausal vasomotor syndrome    -  Primary   Relevant  Medications   estradiol (CLIMARA) 0.0375 mg/24hr patch      Mild improvement with symptoms. Discussed with patient careful titration of dose due to potential side effects. Will increase estrogen patch to 0.0375 mg. Advised to let me know if unable to tolerate increased dose. Will continue to monitor and reassess in 3 months. If symptoms improve and remain stable then will consider tapering off.    Meds ordered this encounter  Medications   estradiol (CLIMARA) 0.0375 mg/24hr patch    Sig: Place 1 patch (0.0375 mg total) onto the skin once a week.    Dispense:  4 patch    Refill:  2    Order Specific Question:   Supervising Provider    Answer:   Nani Gasser D [[id number]]    Follow-up: Return in about 3 months (around 12/28/2021) for HRT.    Mayer Masker, PA-C

## 2021-09-27 NOTE — Patient Instructions (Signed)

## 2021-10-01 ENCOUNTER — Other Ambulatory Visit: Payer: Self-pay

## 2021-10-01 ENCOUNTER — Ambulatory Visit (INDEPENDENT_AMBULATORY_CARE_PROVIDER_SITE_OTHER): Payer: Medicare Other | Admitting: Physician Assistant

## 2021-10-01 ENCOUNTER — Encounter: Payer: Self-pay | Admitting: Physician Assistant

## 2021-10-01 VITALS — BP 118/62 | HR 60 | Temp 97.2°F | Ht 62.0 in | Wt 188.0 lb

## 2021-10-01 DIAGNOSIS — R252 Cramp and spasm: Secondary | ICD-10-CM

## 2021-10-01 NOTE — Patient Instructions (Signed)
Leg Cramps Leg cramps occur when one or more muscles tighten and a person has no control over it (involuntary muscle contraction). Muscle cramps are most common in the calf muscles of the leg. They can occur during exercise or at rest. Leg cramps are painful, and they may last for a few seconds to a few minutes. Cramps may return several times before they finallystop. Usually, leg cramps are not caused by a serious medical problem. In many cases, the cause is not known. Some common causes include: Excessive physical effort (overexertion), such as during intense exercise. Doing the same motion over and over. Staying in a certain position for a long period of time. Improper preparation, form, or technique while doing a sport or an activity. Dehydration. Injury. Side effects of certain medicines. Abnormally low levels of minerals in your blood (electrolytes), especially potassium and calcium. This could result from: Pregnancy. Taking diuretic medicines. Follow these instructions at home: Eating and drinking Drink enough fluid to keep your urine pale yellow. Staying hydrated may help prevent cramps. Eat a healthy diet that includes plenty of nutrients to help your muscles function. A healthy diet includes fruits and vegetables, lean protein, whole grains, and low-fat or nonfat dairy products. Managing pain, stiffness, and swelling     Try massaging, stretching, and relaxing the affected muscle. Do this for several minutes at a time. If directed, put ice on areas that are sore or painful after a cramp. To do this: Put ice in a plastic bag. Place a towel between your skin and the bag. Leave the ice on for 20 minutes, 2-3 times a day. Remove the ice if your skin turns bright red. This is very important. If you cannot feel pain, heat, or cold, you have a greater risk of damage to the area. If directed, apply heat to muscles that are tense or tight. Do this before you exercise, or as often as told  by your health care provider. Use the heat source that your health care provider recommends, such as a moist heat pack or a heating pad. To do this: Place a towel between your skin and the heat source. Leave the heat on for 20-30 minutes. Remove the heat if your skin turns bright red. This is especially important if you are unable to feel pain, heat, or cold. You may have a greater risk of getting burned. Try taking hot showers or baths to help relax tight muscles. General instructions If you are having frequent leg cramps, avoid intense exercise for several days. Take over-the-counter and prescription medicines only as told by your health care provider. Keep all follow-up visits. This is important. Contact a health care provider if: Your leg cramps get more severe or more frequent, or they do not improve over time. Your foot becomes cold, numb, or blue. Summary Muscle cramps can develop in any muscle, but the most common place is in the calf muscles of the leg. Leg cramps are painful, and they may last for a few seconds to a few minutes. Usually, leg cramps are not caused by a serious medical problem. Often, the cause is not known. Stay hydrated, and take over-the-counter and prescription medicines only as told by your health care provider. This information is not intended to replace advice given to you by your health care provider. Make sure you discuss any questions you have with your healthcare provider. Document Revised: 11/16/2019 Document Reviewed: 11/16/2019 Elsevier Patient Education  2022 Elsevier Inc.  

## 2021-10-01 NOTE — Progress Notes (Addendum)
?Established patient acute visit ? ? ?Patient: Janice Pace   DOB: [birth year]   76 y.o. Female  MRN: [id number] ?Visit Date: 10/01/2021 ? ?Chief Complaint  ?Patient presents with  ? Acute Visit  ? ?Subjective  ?  ?HPI  ?Patient presents with c/o leg cramps, mostly right leg. Reports cramps have been ongoing for a while but was worse over the weekend. Cramps mostly occur at night. Reports Saturday did walk a lot. Not drinking much water. Denies swelling, fever, or paresthesia. Reports did take a Gabapentin Saturday night which did provide some relief. Also has been taking mustard. ? ? ? ?Medications: ?Outpatient Medications Prior to Visit  ?Medication Sig  ? acetaminophen (TYLENOL) 500 MG tablet Take 1,000 mg by mouth every 6 (six) hours as needed for moderate pain or headache.  ? albuterol (VENTOLIN HFA) 108 (90 Base) MCG/ACT inhaler Inhale 1-2 puffs into the lungs every 6 (six) hours as needed for wheezing or shortness of breath.  ? ascorbic acid (VITAMIN C) 1000 MG tablet Take 1 tablet by mouth daily.  ? aspirin 81 MG EC tablet Take 1 tablet by mouth daily.  ? Calcium Carbonate-Vit D-Min (CALCIUM 600+D3 PLUS MINERALS) 600-800 MG-UNIT CHEW 1 tablet with a meal  ? cyclobenzaprine (FLEXERIL) 5 MG tablet Take 5 mg by mouth See admin instructions. Take 5 mg at bedtime, may take a second 5 mg dose in the middle of the night as needed for spasms  ? docusate sodium (COLACE) 100 MG capsule Take 500 mg by mouth at bedtime.  ? ergocalciferol (DRISDOL) 1.25 MG (50000 UT) capsule Take 1 capsule (50,000 Units total) by mouth once a week.  ? estradiol (CLIMARA) 0.0375 mg/24hr patch Place 1 patch (0.0375 mg total) onto the skin once a week.  ? fexofenadine (ALLEGRA) 180 MG tablet Take 180 mg by mouth daily as needed for allergies or rhinitis.  ? fluticasone furoate-vilanterol (BREO ELLIPTA) 100-25 MCG/ACT AEPB Inhale 1 puff into the lungs daily.  ? gabapentin (NEURONTIN) 300 MG capsule Take 1-2 capsules (300-600 mg total) by  mouth 3 (three) times daily as needed (Nerve pain).  ? ibuprofen (ADVIL) 200 MG tablet Take 1 tablet by mouth as needed.  ? metoprolol succinate (TOPROL-XL) 50 MG 24 hr tablet Take 1 tablet (50 mg total) by mouth daily. Take with or immediately following a meal.  ? Misc Natural Products (BLACK CHERRY CONCENTRATE PO) Take 2 capsules by mouth daily.  ? omeprazole (PRILOSEC) 20 MG capsule Take 1 capsule (20 mg total) by mouth daily before breakfast.  ? rosuvastatin (CRESTOR) 10 MG tablet Take 1 tablet (10 mg total) by mouth daily.  ? ?No facility-administered medications prior to visit.  ? ? ?Review of Systems ?Review of Systems:  ?A fourteen system review of systems was performed and found to be positive as per HPI. ? ? ?  Objective  ?  ?BP (!) 147/73   Pulse 60   Temp (!) 97.2 ?F (36.2 ?C)   Ht '5\' 2"'$  (1.575 m)   Wt 188 lb (85.3 kg)   SpO2 98%   BMI 34.39 kg/m?  ? ? ?Physical Exam  ?General:  Well Developed, well nourished, appropriate for stated age.  ?Neuro:  Alert and oriented,  extra-ocular muscles intact  ?HEENT:  Normocephalic, atraumatic, neck supple  ?Skin:  no gross rash, warm, pink. ?Cardiac:  RRR, S1 S2 ?Respiratory: CTA B/L  ?Vascular:  Ext warm, no cyanosis apprec.; cap RF less 2 sec. ?Extremities: tenderness and muscle tension of right  calve, no swelling or erythema of both calves or LE ?Psych:  No HI/SI, judgement and insight good, Euthymic mood. Full Affect. ? ? ?No results found for any visits on 10/01/21. ? Assessment & Plan  ?  ? ?Discussed with patient various etiologies and since symptoms have been chronic likely related to RLS. Recommend to increase hydration. Advised to take Gabapentin more consistently for 1-2 days and if symptoms improve then can resume taking as needed. Discussed passive stretches and cold compresses before bedtime.  Will collect labs to evaluate for electrolyte imbalance or nutritional deficiency.  ? ? ?Return if symptoms worsen or fail to improve.  ?   ? ? ? ?Lorrene Reid, PA-C  ?[facility] ?[phone] (phone) ?[phone] (fax) ? ?[facility] ?

## 2021-10-02 LAB — CBC WITH DIFFERENTIAL/PLATELET
Basophils Absolute: 0 10*3/uL (ref 0.0–0.2)
Basos: 1 %
EOS (ABSOLUTE): 0.1 10*3/uL (ref 0.0–0.4)
Eos: 2 %
Hematocrit: 42.2 % (ref 34.0–46.6)
Hemoglobin: 14.3 g/dL (ref 11.1–15.9)
Immature Grans (Abs): 0 10*3/uL (ref 0.0–0.1)
Immature Granulocytes: 0 %
Lymphocytes Absolute: 2 10*3/uL (ref 0.7–3.1)
Lymphs: 31 %
MCH: 30.6 pg (ref 26.6–33.0)
MCHC: 33.9 g/dL (ref 31.5–35.7)
MCV: 90 fL (ref 79–97)
Monocytes Absolute: 0.6 10*3/uL (ref 0.1–0.9)
Monocytes: 8 %
Neutrophils Absolute: 3.8 10*3/uL (ref 1.4–7.0)
Neutrophils: 58 %
Platelets: 230 10*3/uL (ref 150–450)
RBC: 4.68 x10E6/uL (ref 3.77–5.28)
RDW: 12.8 % (ref 11.7–15.4)
WBC: 6.6 10*3/uL (ref 3.4–10.8)

## 2021-10-02 LAB — COMPREHENSIVE METABOLIC PANEL
ALT: 18 IU/L (ref 0–32)
AST: 21 IU/L (ref 0–40)
Albumin/Globulin Ratio: 2 (ref 1.2–2.2)
Albumin: 4.4 g/dL (ref 3.7–4.7)
Alkaline Phosphatase: 110 IU/L (ref 44–121)
BUN/Creatinine Ratio: 18 (ref 12–28)
BUN: 13 mg/dL (ref 8–27)
Bilirubin Total: 0.6 mg/dL (ref 0.0–1.2)
CO2: 24 mmol/L (ref 20–29)
Calcium: 9.3 mg/dL (ref 8.7–10.3)
Chloride: 103 mmol/L (ref 96–106)
Creatinine, Ser: 0.74 mg/dL (ref 0.57–1.00)
Globulin, Total: 2.2 g/dL (ref 1.5–4.5)
Glucose: 109 mg/dL — ABNORMAL HIGH (ref 70–99)
Potassium: 5 mmol/L (ref 3.5–5.2)
Sodium: 141 mmol/L (ref 134–144)
Total Protein: 6.6 g/dL (ref 6.0–8.5)
eGFR: 84 mL/min/{1.73_m2} (ref >59)

## 2021-10-02 LAB — B12 AND FOLATE PANEL
Folate: 9.7 ng/mL (ref >3.0)
Vitamin B-12: 326 pg/mL (ref 232–1245)

## 2021-10-02 LAB — MAGNESIUM: Magnesium: 2.2 mg/dL (ref 1.6–2.3)

## 2021-11-24 NOTE — Progress Notes (Signed)
Established patient visit ? ? ?Patient: Janice Pace   DOB: [birth year]   76 y.o. Female  MRN: [id number] ?Visit Date: 11/25/2021 ? ? ?Chief Complaint  ?Patient presents with  ? Follow-up  ? Medication Refill  ? ?Subjective  ?  ?HPI  ?Six month follow up visit ?-high blood pressure. ?-recently seen for leg cramps. Advised to increase hydration and take gabapentin more consistently. Patient states that leg cramps have improved.  ?-started on estrogen patch weekly. States that she has less frequent hot flashes and is sleeping much better. Is  ?--Normal CMP ?--low/normal B12 - patient has started taking OTC B12 vitamin/Bcomplex.  ?-she denies new concerns or complaints. denies chest pain, chest pressure, or shortness of breath. She denies headaches or visual disturbances. She denies abdominal pain, nausea, vomiting, or changes in bowel or bladder habits.   ? ? ? ?Medications: ?Outpatient Medications Prior to Visit  ?Medication Sig  ? acetaminophen (TYLENOL) 500 MG tablet Take 1,000 mg by mouth every 6 (six) hours as needed for moderate pain or headache.  ? albuterol (VENTOLIN HFA) 108 (90 Base) MCG/ACT inhaler Inhale 1-2 puffs into the lungs every 6 (six) hours as needed for wheezing or shortness of breath.  ? ascorbic acid (VITAMIN C) 1000 MG tablet Take 1 tablet by mouth daily.  ? aspirin 81 MG EC tablet Take 1 tablet by mouth daily.  ? Calcium Carbonate-Vit D-Min (CALCIUM 600+D3 PLUS MINERALS) 600-800 MG-UNIT CHEW 1 tablet with a meal  ? cyclobenzaprine (FLEXERIL) 5 MG tablet Take 5 mg by mouth See admin instructions. Take 5 mg at bedtime, may take a second 5 mg dose in the middle of the night as needed for spasms  ? docusate sodium (COLACE) 100 MG capsule Take 500 mg by mouth at bedtime.  ? ergocalciferol (DRISDOL) 1.25 MG (50000 UT) capsule Take 1 capsule (50,000 Units total) by mouth once a week.  ? estradiol (CLIMARA) 0.0375 mg/24hr patch Place 1 patch (0.0375 mg total) onto the skin once a week.  ? fexofenadine  (ALLEGRA) 180 MG tablet Take 180 mg by mouth daily as needed for allergies or rhinitis.  ? fluticasone furoate-vilanterol (BREO ELLIPTA) 100-25 MCG/ACT AEPB Inhale 1 puff into the lungs daily.  ? gabapentin (NEURONTIN) 300 MG capsule Take 1-2 capsules (300-600 mg total) by mouth 3 (three) times daily as needed (Nerve pain).  ? ibuprofen (ADVIL) 200 MG tablet Take 1 tablet by mouth as needed.  ? Misc Natural Products (BLACK CHERRY CONCENTRATE PO) Take 2 capsules by mouth daily.  ? omeprazole (PRILOSEC) 20 MG capsule Take 1 capsule (20 mg total) by mouth daily before breakfast.  ? rosuvastatin (CRESTOR) 10 MG tablet Take 1 tablet (10 mg total) by mouth daily.  ? metoprolol succinate (TOPROL-XL) 50 MG 24 hr tablet Take 1 tablet (50 mg total) by mouth daily. Take with or immediately following a meal.  ? ?No facility-administered medications prior to visit.  ? ? ?Review of Systems  ?Constitutional:  Negative for activity change, appetite change, chills, fatigue and fever.  ?HENT:  Negative for congestion, postnasal drip, rhinorrhea, sinus pressure, sinus pain, sneezing and sore throat.   ?Eyes: Negative.   ?Respiratory:  Negative for cough, chest tightness, shortness of breath and wheezing.   ?Cardiovascular:  Negative for chest pain and palpitations.  ?Gastrointestinal:  Negative for abdominal pain, constipation, diarrhea, nausea and vomiting.  ?Endocrine: Negative for cold intolerance, heat intolerance, polydipsia and polyuria.  ?Genitourinary:  Negative for dyspareunia, dysuria, flank pain, frequency and urgency.  ?  Musculoskeletal:  Negative for arthralgias, back pain and myalgias.  ?Skin:  Negative for rash.  ?Allergic/Immunologic: Negative for environmental allergies.  ?Neurological:  Negative for dizziness, weakness and headaches.  ?Hematological:  Negative for adenopathy.  ?Psychiatric/Behavioral:  The patient is not nervous/anxious.   ? ?Last CBC ?Lab Results  ?Component Value Date  ? WBC 6.6 10/01/2021  ? HGB  14.3 10/01/2021  ? HCT 42.2 10/01/2021  ? MCV 90 10/01/2021  ? MCH 30.6 10/01/2021  ? RDW 12.8 10/01/2021  ? PLT 230 10/01/2021  ? ?Last metabolic panel ?Lab Results  ?Component Value Date  ? GLUCOSE 109 (H) 10/01/2021  ? NA 141 10/01/2021  ? K 5.0 10/01/2021  ? CL 103 10/01/2021  ? CO2 24 10/01/2021  ? BUN 13 10/01/2021  ? CREATININE 0.74 10/01/2021  ? EGFR 84 10/01/2021  ? CALCIUM 9.3 10/01/2021  ? PROT 6.6 10/01/2021  ? ALBUMIN 4.4 10/01/2021  ? LABGLOB 2.2 10/01/2021  ? AGRATIO 2.0 10/01/2021  ? BILITOT 0.6 10/01/2021  ? ALKPHOS 110 10/01/2021  ? AST 21 10/01/2021  ? ALT 18 10/01/2021  ? ANIONGAP 8 03/08/2021  ? ?Last lipids ?Lab Results  ?Component Value Date  ? CHOL 166 05/31/2021  ? HDL 42 05/31/2021  ? [place]yer 89 05/31/2021  ? TRIG 206 (H) 05/31/2021  ? CHOLHDL 4.0 05/31/2021  ? ?Last hemoglobin A1c ?Lab Results  ?Component Value Date  ? HGBA1C 5.6 05/31/2021  ? ?Last thyroid functions ?Lab Results  ?Component Value Date  ? TSH 1.320 05/31/2021  ? T3TOTAL 116 10/03/2019  ? ?Last vitamin D ?Lab Results  ?Component Value Date  ? VD25OH 24.7 (L) 05/31/2021  ? ?Last vitamin B12 and Folate ?Lab Results  ?Component Value Date  ? MOLMBEML54 326 10/01/2021  ? FOLATE 9.7 10/01/2021  ? ?  ? ? Objective  ?  ? ?Today's Vitals  ? 11/25/21 0855  ?BP: 132/67  ?Pulse: 62  ?Temp: (!) 97.2 ?F (36.2 ?C)  ?SpO2: 97%  ?Weight: 187 lb 6.4 oz (85 kg)  ?Height: 5' 1.81" (1.57 m)  ? ?Body mass index is 34.49 kg/m?.  ? ?BP Readings from Last 3 Encounters:  ?11/25/21 132/67  ?10/01/21 118/62  ?09/27/21 127/72  ?  ?Wt Readings from Last 3 Encounters:  ?11/25/21 187 lb 6.4 oz (85 kg)  ?10/01/21 188 lb (85.3 kg)  ?09/27/21 189 lb (85.7 kg)  ?  ?Physical Exam ?Vitals and nursing note reviewed.  ?Constitutional:   ?   Appearance: Normal appearance. She is well-developed.  ?HENT:  ?   Head: Normocephalic and atraumatic.  ?Eyes:  ?   Pupils: Pupils are equal, round, and reactive to light.  ?Neck:  ?   Vascular: No carotid bruit.   ?Cardiovascular:  ?   Rate and Rhythm: Normal rate and regular rhythm.  ?   Pulses: Normal pulses.  ?   Heart sounds: Normal heart sounds.  ?Pulmonary:  ?   Effort: Pulmonary effort is normal.  ?   Breath sounds: Normal breath sounds.  ?Abdominal:  ?   Palpations: Abdomen is soft.  ?Musculoskeletal:     ?   General: Normal range of motion.  ?   Cervical back: Normal range of motion and neck supple.  ?Lymphadenopathy:  ?   Cervical: No cervical adenopathy.  ?Skin: ?   General: Skin is warm and dry.  ?   Capillary Refill: Capillary refill takes less than 2 seconds.  ?Neurological:  ?   General: No focal deficit present.  ?  Mental Status: She is alert and oriented to person, place, and time.  ?Psychiatric:     ?   Mood and Affect: Mood normal.     ?   Behavior: Behavior normal.     ?   Thought Content: Thought content normal.     ?   Judgment: Judgment normal.  ?  ? ? Assessment & Plan  ?  ?1. Other hyperlipidemia ?Continue crestor 87m daily. Check fasting lipids prior to next visit. Adjust medication as indicated.  ? ?2. Leg cramps ?Improved. Continue proper hydration and taking gabapentin consistently.  ? ?3. Menopausal vasomotor syndrome ?Continue with estrogen patch. Will discuss weaning off at next visit in 6 months  ? ?4. Body mass index (BMI) of 34.0-34.9 in adult ?Discussed lowering calorie intake to 1500 calories per day and incorporating exercise into daily routine to help lose weight. ? ? ?Problem List Items Addressed This Visit   ? ?  ? Other  ? Other hyperlipidemia - Primary  ? Leg cramps  ? Menopausal vasomotor syndrome  ? Body mass index (BMI) of 34.0-34.9 in adult  ?  ? ?Return in about 6 months (around 05/28/2022) for medicare wellness, FBW a week prior to visit - ok to cancel f/u appt for 12/2021. discussed today .  ?   ? ? ? ? ?HRonnell Freshwater NP  ?[facility]?[phone](phone) ?[phone](fax) ? ?[facility]  ?

## 2021-11-25 ENCOUNTER — Ambulatory Visit (INDEPENDENT_AMBULATORY_CARE_PROVIDER_SITE_OTHER): Payer: Medicare Other | Admitting: Nurse Practitioner

## 2021-11-25 ENCOUNTER — Encounter: Payer: Self-pay | Admitting: Nurse Practitioner

## 2021-11-25 VITALS — BP 132/67 | HR 62 | Temp 97.2°F | Ht 61.81 in | Wt 187.4 lb

## 2021-11-25 DIAGNOSIS — E7849 Other hyperlipidemia: Secondary | ICD-10-CM | POA: Diagnosis not present

## 2021-11-25 DIAGNOSIS — Z6834 Body mass index (BMI) 34.0-34.9, adult: Secondary | ICD-10-CM | POA: Diagnosis not present

## 2021-11-25 DIAGNOSIS — R252 Cramp and spasm: Secondary | ICD-10-CM

## 2021-11-25 DIAGNOSIS — N951 Menopausal and female climacteric states: Secondary | ICD-10-CM

## 2021-12-09 ENCOUNTER — Other Ambulatory Visit: Payer: Self-pay | Admitting: Nurse Practitioner

## 2021-12-09 DIAGNOSIS — E559 Vitamin D deficiency, unspecified: Secondary | ICD-10-CM

## 2021-12-17 NOTE — Progress Notes (Signed)
Primary Physician/Referring:  Lorrene Reid, PA-C  Patient ID: Janice Pace, female    DOB: 1946/03/10, 76 y.o.   MRN: [id number]  No chief complaint on file.  HPI:    Janice Pace  is a 76 y.o. Patient with hyperlipidemia, chronic back pain, underwent anterior lateral interbody fusion of L2-3 and 3 4 with posterior spinal fixation on 02/27/2021.   Due to elevated heart rate and dyspnea on exertion patient underwent echocardiogram which noted normal LVEF with grade 1 diastolic dysfunction, moderate LVH, otherwise no significant abnormalities.  CTA of the chest was negative for PE, however did show bronchitis and pulmonary nodules. Stress test which was overall low risk.   Patient was last seen in our office 06/18/21 at which time she was referred to pulmonology for further evaluation and palpitations had resolved. Patient now presents for 6 month follow up. ***  ***  , hospital stay was complicated by GI bleed from colitis, she also had UTI and tachycardia related to above-mentioned conditions.    Patient had made appointment to see our office 04/04/2021 due to heart rate >100 bpm and worsening dyspnea on exertion.  Patient subsequently underwentEchocardiogram, stress test, and CTA of the chest.  Stress test was overall low risk.  Echocardiogram noted normal LVEF with grade 1 diastolic dysfunction, moderate LVH, otherwise no significant abnormalities.  CTA of the chest was negative for PE, however did show bronchitis and pulmonary nodules.  At last office visit given tachycardia patient was started on metoprolol succinate 50 mg p.o. daily.  Patient is tolerating metoprolol without issue.  Tachycardia has resolved and blood pressure is well controlled.  She states her dyspnea on exertion has improved since last office visit, however she does continue to have symptoms.  Patient was recently seen by her PCP who recommended referral to pulmonology to follow-up on recent CTA findings, agree with  referral.   Patient denies chest pain, dizziness, syncope, near syncope, orthopnea, PND, leg edema.  Past Medical History:  Diagnosis Date   Arthritis    Bronchitis    Cancer ([place])    dermatosivros arcoma and protuverans   Chronic back pain    degenerative disc disease   Constipation    stool softener daily   Constipation    GERD (gastroesophageal reflux disease)    takes Prilosec daily   GERD (gastroesophageal reflux disease)    Heart murmur    Heart valve disorder    Hemorrhoids    History of hiatal hernia    Insomnia    d/t chantix   Joint pain    Nocturia    Personal history of colonic adenoma 03/13/2003   03/13/2003 - 5 mm adenoma   Pre-diabetes    Prediabetes    Past Surgical History:  Procedure Laterality Date   ABDOMINAL HYSTERECTOMY     ANTERIOR LAT LUMBAR FUSION N/A 02/27/2021   Procedure: Anterior lateral interbody fusion - lateral two - lateral three - lateral three - lateral four with exploration fusion L4-S1;  Surgeon: Kary Kos, MD;  Location: [facility];  Service: Neurosurgery;  Laterality: N/A;   BACK SURGERY  1988   BACK SURGERY  09-03-11   rods,screws x 8    BUNIONECTOMY     bilateral   BUNIONECTOMY     bil feet   COLONOSCOPY     dermatosibrosarcoma protuberans     LAMINECTOMY WITH POSTERIOR LATERAL ARTHRODESIS LEVEL 3 N/A 02/27/2021   Procedure: Posterior augmentation with pedicle screws Latreal one - lateral  four - Posterior Lateral and Interbody fusion;  Surgeon: Kary Kos, MD;  Location: [facility];  Service: Neurosurgery;  Laterality: N/A;   PARTIAL HYSTERECTOMY     RECTOCELE REPAIR     Rectum Repair     RESECTION TUMOR WRIST RADICAL     x 2 rt   TUBAL LIGATION  1974   TUBAL LIGATION     WRIST SURGERY     left   Family History  Problem Relation Age of Onset   Leukemia Mother    Anesthesia problems Neg Hx    Hypotension Neg Hx    Malignant hyperthermia Neg Hx    Pseudochol deficiency Neg Hx    Colon cancer Neg Hx     Social History    Tobacco Use   Smoking status: Former    Packs/day: 1.00    Years: 50.00    Pack years: 50.00    Types: Cigarettes    Quit date: 08/17/2011    Years since quitting: 10.3   Smokeless tobacco: Never  Substance Use Topics   Alcohol use: No    Comment: occasional wine   Marital Status: Married  ROS  Review of Systems  Constitutional: Negative for malaise/fatigue.  Cardiovascular:  Positive for dyspnea on exertion (improving). Negative for chest pain, leg swelling, near-syncope and palpitations (resolved).  Musculoskeletal:  Negative for back pain.  Gastrointestinal:  Negative for abdominal pain, heartburn, hematochezia and melena.  Objective  There were no vitals taken for this visit. There is no height or weight on file to calculate BMI.     11/25/2021    8:55 AM 10/01/2021    8:30 AM 10/01/2021    8:09 AM  Vitals with BMI  Height 5' 1.811"  '5\' 2"'$   Weight 187 lbs 6 oz  188 lbs  BMI 47.42  59.56  Systolic 387 564 332  Diastolic 67 62 73  Pulse 62  60     Physical Exam Vitals reviewed.  Constitutional:      General: She is not in acute distress.    Appearance: She is obese.  Neck:     Vascular: No carotid bruit or JVD.  Cardiovascular:     Rate and Rhythm: Normal rate and regular rhythm.     Pulses: Intact distal pulses.     Heart sounds: No murmur heard.   Gallop present. S4 sounds present.  Pulmonary:     Effort: Pulmonary effort is normal.     Breath sounds: Normal breath sounds.  Musculoskeletal:     Right lower leg: No edema.     Left lower leg: No edema.     Laboratory examination:   Recent Labs    03/06/21 1633 03/07/21 0305 03/08/21 0818 05/31/21 1046 10/01/21 0841  NA 138 137 134* 142 141  K 3.2* 3.3* 3.1* 5.1 5.0  CL 107 107 103 105 103  CO2 '24 23 23 24 24  '$ GLUCOSE 118* 108* 91 106* 109*  BUN 9 8 6* 8 13  CREATININE 0.67 0.59 0.60 0.76 0.74  CALCIUM 8.1* 8.0* 7.8* 9.6 9.3  GFRNONAA >60 >60 >60  --   --     CrCl cannot be calculated  (Patient's most recent lab result is older than the maximum 21 days allowed.).     Latest Ref Rng & Units 10/01/2021    8:41 AM 05/31/2021   10:46 AM 03/08/2021    8:18 AM  CMP  Glucose 70 - 99 mg/dL 109   106   91  BUN 8 - 27 mg/dL '13   8   6    '$ Creatinine 0.57 - 1.00 mg/dL 0.74   0.76   0.60    Sodium 134 - 144 mmol/L 141   142   134    Potassium 3.5 - 5.2 mmol/L 5.0   5.1   3.1    Chloride 96 - 106 mmol/L 103   105   103    CO2 20 - 29 mmol/L '24   24   23    '$ Calcium 8.7 - 10.3 mg/dL 9.3   9.6   7.8    Total Protein 6.0 - 8.5 g/dL 6.6   6.4     Total Bilirubin 0.0 - 1.2 mg/dL 0.6   0.5     Alkaline Phos 44 - 121 IU/L 110   121     AST 0 - 40 IU/L 21   25     ALT 0 - 32 IU/L 18   27         Latest Ref Rng & Units 10/01/2021    8:41 AM 05/31/2021   10:46 AM 03/01/2021    9:03 AM  CBC  WBC 3.4 - 10.8 x10E3/uL 6.6   5.2   11.7    Hemoglobin 11.1 - 15.9 g/dL 14.3   14.4   12.5    Hematocrit 34.0 - 46.6 % 42.2   43.4   36.2    Platelets 150 - 450 x10E3/uL 230   244   157     Lipid Panel Recent Labs    05/31/21 1046  CHOL 166  TRIG 206*  LDLCALC 89  HDL 42  CHOLHDL 4.0    HEMOGLOBIN A1C Lab Results  Component Value Date   HGBA1C 5.6 05/31/2021   MPG 119.76 02/19/2021   TSH Recent Labs    05/31/21 1046  TSH 1.320     External labs:   Cholesterol, total 136.000 m 06/13/2020 HDL 53.000 mg 06/13/2020 LDL 65.000 mg 06/13/2020 Triglycerides 99.000 mg 06/13/2020  TSH 2.100 10/03/2019  Allergies   Allergies  Allergen Reactions   Sulfa Antibiotics Hives and Itching   Atorvastatin Other (See Comments)    mucsle aches   Metformin Hcl Other (See Comments)    joint pains and muscle aches    Medication prior to this encounter:   Outpatient Medications Prior to Visit  Medication Sig Dispense Refill   acetaminophen (TYLENOL) 500 MG tablet Take 1,000 mg by mouth every 6 (six) hours as needed for moderate pain or headache.     albuterol (VENTOLIN HFA) 108 (90 Base)  MCG/ACT inhaler Inhale 1-2 puffs into the lungs every 6 (six) hours as needed for wheezing or shortness of breath.     ascorbic acid (VITAMIN C) 1000 MG tablet Take 1 tablet by mouth daily.     aspirin 81 MG EC tablet Take 1 tablet by mouth daily.     Calcium Carbonate-Vit D-Min (CALCIUM 600+D3 PLUS MINERALS) 600-800 MG-UNIT CHEW 1 tablet with a meal     cyclobenzaprine (FLEXERIL) 5 MG tablet Take 5 mg by mouth See admin instructions. Take 5 mg at bedtime, may take a second 5 mg dose in the middle of the night as needed for spasms     docusate sodium (COLACE) 100 MG capsule Take 500 mg by mouth at bedtime.     ergocalciferol (DRISDOL) 1.25 MG (50000 UT) capsule Take 1 capsule (50,000 Units total) by mouth once a week. 4 capsule 5   estradiol (  CLIMARA) 0.0375 mg/24hr patch Place 1 patch (0.0375 mg total) onto the skin once a week. 4 patch 2   fexofenadine (ALLEGRA) 180 MG tablet Take 180 mg by mouth daily as needed for allergies or rhinitis.     fluticasone furoate-vilanterol (BREO ELLIPTA) 100-25 MCG/ACT AEPB Inhale 1 puff into the lungs daily. 28 each 6   gabapentin (NEURONTIN) 300 MG capsule Take 1-2 capsules (300-600 mg total) by mouth 3 (three) times daily as needed (Nerve pain). 180 capsule 2   ibuprofen (ADVIL) 200 MG tablet Take 1 tablet by mouth as needed.     metoprolol succinate (TOPROL-XL) 50 MG 24 hr tablet Take 1 tablet (50 mg total) by mouth daily. Take with or immediately following a meal. 30 tablet 2   Misc Natural Products (BLACK CHERRY CONCENTRATE PO) Take 2 capsules by mouth daily.     omeprazole (PRILOSEC) 20 MG capsule Take 1 capsule (20 mg total) by mouth daily before breakfast. 90 capsule 1   rosuvastatin (CRESTOR) 10 MG tablet Take 1 tablet (10 mg total) by mouth daily. 90 tablet 1   No facility-administered medications prior to visit.    Medication list after today's encounter   Current Outpatient Medications  Medication Instructions   acetaminophen (TYLENOL) 1,000 mg,  Oral, Every 6 hours PRN   albuterol (VENTOLIN HFA) 108 (90 Base) MCG/ACT inhaler 1-2 puffs, Inhalation, Every 6 hours PRN   ascorbic acid (VITAMIN C) 1000 MG tablet 1 tablet, Oral, Daily   aspirin 81 MG EC tablet 1 tablet, Oral, Daily   Calcium Carbonate-Vit D-Min (CALCIUM 600+D3 PLUS MINERALS) 600-800 MG-UNIT CHEW 1 tablet with a meal   cyclobenzaprine (FLEXERIL) 5 mg, Oral, See admin instructions, Take 5 mg at bedtime, may take a second 5 mg dose in the middle of the night as needed for spasms   docusate sodium (COLACE) 500 mg, Oral, Daily at bedtime   ergocalciferol (DRISDOL) 50,000 Units, Oral, Weekly   estradiol (CLIMARA) 0.0375 mg, Transdermal, Weekly   fexofenadine (ALLEGRA) 180 mg, Oral, Daily PRN   fluticasone furoate-vilanterol (BREO ELLIPTA) 100-25 MCG/ACT AEPB 1 puff, Inhalation, Daily   gabapentin (NEURONTIN) 300-600 mg, Oral, 3 times daily PRN   ibuprofen (ADVIL) 200 MG tablet 1 tablet, Oral, As needed   metoprolol succinate (TOPROL-XL) 50 mg, Oral, Daily, Take with or immediately following a meal.   Misc Natural Products (BLACK CHERRY CONCENTRATE PO) 2 capsules, Oral, Daily   omeprazole (PRILOSEC) 20 mg, Oral, Daily before breakfast   rosuvastatin (CRESTOR) 10 mg, Oral, Daily   Radiology:   CT scan of the abdomen 03/07/2021: 1. Dilated and gas-filled portions of colon and small bowel with contrast transiting to the colon. Changes likely represent adynamic ileus. 2. Bilateral adrenal gland nodules, likely benign adenomas. No follow-up is indicated. 3. Fatty infiltration of the liver. 4. Atelectasis in the lung bases. 5. Aortic atherosclerosis. 6. Postoperative changes in the lumbar spine.  CXR PA/LAT 04/03/2021: Few densities at the left lung base appear chronic and could represent a small amount of scarring. Otherwise, the lungs are clear. Heart and mediastinum are within normal limits. Spinal hardware in the upper lumbar spine. No pleural effusions.  CTA chest PE  05/17/2021: 1. Negative examination for pulmonary embolism. 2. Diffuse bilateral bronchial wall thickening and mild mosaic attenuation of the airspaces throughout. Findings are consistent with nonspecific infectious or inflammatory bronchitis and small airways disease. 3. Minimal paraseptal emphysema. 4. Background of very fine centrilobular pulmonary nodules, most concentrated at the lung apices, most commonly  seen in smoking-related respiratory bronchiolitis. 5. Coronary artery disease. Aortic Atherosclerosis and Emphysema  Cardiac Studies:   Treadmill exercise stress test 06/01/2017: Indication: Dyspnea. Resting EKG demonstrates Normal sinus rhythm. The patient exercised on Bruce protocol for  6:00 min. Patient 7.05METS and reached HR 135   bpm, which is   90% of maximum age-predicted HR.  Stress test terminated due to Fatigue and dyspnea and achieved 90% of MPHR for age. Hypertensive at rest and normal response with stress. Rest BP  168/94 mm Hg. Chest Pain: none.  Arrhythmias: none. ST Changes: With peak exercise there was no ST-T changes of ischemia. Overall Impression: Normal stress test. Exercise capacity was below average for age.  PCV ECHOCARDIOGRAM COMPLETE 04/16/2021 Left ventricle cavity is normal in size. Moderate concentric hypertrophy of the left ventricle. Normal global wall motion. Normal LV systolic function with visual EF 55-60%. Doppler evidence of grade I (impaired) diastolic dysfunction, normal LAP. Trace TR. No evidence of pulmonary hypertension.   [facility]V MYOCARDIAL PERFUSION WITH LEXISCAN 05/29/2021 Nondiagnostic ECG stress. Myocardial perfusion is normal. LV is normal in small size in both rest and stress images. Overall LV systolic function is normal without regional wall motion abnormalities. Stress LV EF: 47%. Visually LVEF is normal and may be underestimated due to low volume LV. No previous exam available for comparison. Low risk.   EKG:  ***   EKG 04/04/2021:  Sinus tachycardia at rate of 106 bpm, incomplete right bundle branch block.  Normal EKG.      Assessment   No diagnosis found.    There are no discontinued medications.    No orders of the defined types were placed in this encounter.   No orders of the defined types were placed in this encounter.   Recommendations:   Janice Pace is a 76 y.o. Patient with hyperlipidemia, chronic back pain, underwent anterior lateral interbody fusion of L2-3 and 3 4 with posterior spinal fixation on 02/27/2021, hospital stay was complicated by GI bleed from colitis, she also had UTI and tachycardia related to above-mentioned conditions.    Due to elevated heart rate and dyspnea on exertion patient underwent echocardiogram which noted normal LVEF with grade 1 diastolic dysfunction, moderate LVH, otherwise no significant abnormalities.  CTA of the chest was negative for PE, however did show bronchitis and pulmonary nodules. Stress test which was overall low risk.   Patient was last seen in our office 06/18/21 at which time she was referred to pulmonology for further evaluation and palpitations had resolved. Patient now presents for 6 month follow up. ***  ***  Patient had made appointment to see our office 04/04/2021 due to heart rate >100 bpm and worsening dyspnea on exertion.  Patient subsequently underwentEchocardiogram, stress test, and CTA of the chest.  Stress test was overall low risk.  Echocardiogram noted normal LVEF with grade 1 diastolic dysfunction, moderate LVH, otherwise no significant abnormalities.  CTA of the chest was negative for PE, however did show bronchitis and pulmonary nodules.  Reviewed and discussed results of echocardiogram, stress test, and CTA with patient and her husband who is present at bedside.  All questions were addressed.  Cardiac work-up is overall reassuring that dyspnea is not related to cardiac etiology, rather suspect underlying lung disease to be contributing to  patient's symptoms.  Recommend referral to pulmonology for further evaluation.  There is no evidence of ischemic heart disease or heart failure.  Patient's palpitations/tachycardia have resolved with addition of metoprolol which she is tolerating without  issue, will therefore continue this.  Commend patient continue primary prevention from a cardiac standpoint.  CTA did note aortic and coronary atherosclerosis, therefore continue on therapy.  Follow-up in 6 months, sooner if needed, for hyperlipidemia, hypertension, tachycardia, dyspnea on exertion.   Alethia Berthold, PA-C 12/17/2021, 1:28 PM Office: [phone]

## 2021-12-18 ENCOUNTER — Encounter: Payer: Self-pay | Admitting: Student

## 2021-12-18 ENCOUNTER — Ambulatory Visit: Payer: Medicare Other | Admitting: Student

## 2021-12-18 VITALS — BP 132/69 | HR 87 | Temp 97.5°F | Resp 16 | Ht 61.0 in | Wt 193.0 lb

## 2021-12-18 DIAGNOSIS — I2510 Atherosclerotic heart disease of native coronary artery without angina pectoris: Secondary | ICD-10-CM

## 2021-12-18 DIAGNOSIS — R002 Palpitations: Secondary | ICD-10-CM

## 2021-12-18 DIAGNOSIS — R0609 Other forms of dyspnea: Secondary | ICD-10-CM

## 2022-01-01 ENCOUNTER — Ambulatory Visit: Payer: Medicare Other | Admitting: Physician Assistant

## 2022-02-03 ENCOUNTER — Encounter: Payer: Self-pay | Admitting: Pulmonary Disease

## 2022-02-03 ENCOUNTER — Ambulatory Visit: Payer: Medicare Other | Admitting: Pulmonary Disease

## 2022-02-03 VITALS — BP 120/60 | HR 70 | Temp 98.1°F | Ht 62.0 in | Wt 192.6 lb

## 2022-02-03 DIAGNOSIS — Z87891 Personal history of nicotine dependence: Secondary | ICD-10-CM

## 2022-02-03 DIAGNOSIS — J449 Chronic obstructive pulmonary disease, unspecified: Secondary | ICD-10-CM | POA: Diagnosis not present

## 2022-02-03 DIAGNOSIS — J4540 Moderate persistent asthma, uncomplicated: Secondary | ICD-10-CM

## 2022-02-03 NOTE — Patient Instructions (Addendum)
We will send in a prescription for a nebulizer machine and nebulizer solution.  Use budesonide 0.'5mg'$  nebulizer twice daily Use brovana or performist nebulizer twice daily  Stop using breo inhaler once you receive your nebulizer machine and solution  We will refer you to our lung cancer screening program  Follow up in 3 months

## 2022-02-03 NOTE — Progress Notes (Addendum)
Synopsis: Referred in December 2022 for shortness of breath  Subjective:   PATIENT ID: Janice Pace GENDER: female DOB: 26-Mar-1946, MRN: [id number]  HPI  Chief Complaint  Patient presents with   Follow-up    Follow-up SOB still taking inhaler   Elbia Paro is a 76 year old woman, former smoker with GERD chronic back pain s/p anterior lateral interbody fusion of L2-3 and 3 4 with posterior spinal fixation on 02/27/2021 who returns to pulmonary clinic for COPD.   She had reduced 6MWT and increased RV on PFTs indicating air trapping.   She has been using breo about 3 days per week when her husband reminds her. She does not notice much change in her breathing when using it. She denies issues with cough or wheezing. She denies night time awakenings. She continues to have exertional dyspnea which is no new.   She is asking if a nebulizer regimen would be better than using the breo inhaler.   OV 08/07/21 She reports her breathing is much better after starting Breo Ellipta 100-25 MCG 1 puff daily.  She is not noticing the cough and wheezing like before.  She reports her shortness of breath has improved as well.  She is accompanied by her husband today.  They are planning a motorcycle trip out [place] in June on their [place].  OV 06/20/21 She reports having shortness of breath since her back surgery in August.  The dyspnea is worse with exertion.  She has intermittent cough and wheezing.  She had intermittent wheezing prior to the surgery.  Otherwise she denies significant dyspnea before her hospitalization as she was doing water aerobics on a regular basis.  She has resumed water aerobics but is going a couple days of the week and is noticing that her stamina is improving.  She has been evaluated by Dr. Christen Butter of cardiology with normal exercise stress test and no ischemic changes but reduced exercise capacity for her age.  Echocardiogram 04/16/2021 showed moderate concentric LVH with  LVEF 55 to 60%.  Grade 1 diastolic dysfunction.  Trace tricuspid regurgitation.  She is a former smoker with 50+ pack year smoking history.  She smoked from age [age].  Past Medical History:  Diagnosis Date   Arthritis    Bronchitis    Cancer ([place])    dermatosivros arcoma and protuverans   Chronic back pain    degenerative disc disease   Constipation    stool softener daily   Constipation    GERD (gastroesophageal reflux disease)    takes Prilosec daily   GERD (gastroesophageal reflux disease)    Heart murmur    Heart valve disorder    Hemorrhoids    History of hiatal hernia    Insomnia    d/t chantix   Joint pain    Nocturia    Personal history of colonic adenoma 03/13/2003   03/13/2003 - 5 mm adenoma   Pre-diabetes    Prediabetes      Family History  Problem Relation Age of Onset   Leukemia Mother    Anesthesia problems Neg Hx    Hypotension Neg Hx    Malignant hyperthermia Neg Hx    Pseudochol deficiency Neg Hx    Colon cancer Neg Hx      Social History   Socioeconomic History   Marital status: Married    Spouse name: david   Number of children: 3   Years of education: Not on file   Highest education  level: Not on file  Occupational History   Occupation: retired  Tobacco Use   Smoking status: Former    Packs/day: 1.00    Years: 50.00    Total pack years: 50.00    Types: Cigarettes    Quit date: 08/17/2011    Years since quitting: 10.6   Smokeless tobacco: Never  Vaping Use   Vaping Use: Never used  Substance and Sexual Activity   Alcohol use: No    Comment: occasional wine   Drug use: No   Sexual activity: Yes    Birth control/protection: Surgical, Other-see comments  Other Topics Concern   Not on file  Social History Narrative   ** Merged History Encounter **       Social Determinants of Health   Financial Resource Strain: Not on file  Food Insecurity: Not on file  Transportation Needs: Not on file  Physical Activity: Not on file   Stress: Not on file  Social Connections: Not on file  Intimate Partner Violence: Not on file     Allergies  Allergen Reactions   Sulfa Antibiotics Hives and Itching   Atorvastatin Other (See Comments)    mucsle aches   Metformin Hcl Other (See Comments)    joint pains and muscle aches     Outpatient Medications Prior to Visit  Medication Sig Dispense Refill   albuterol (VENTOLIN HFA) 108 (90 Base) MCG/ACT inhaler Inhale 1-2 puffs into the lungs every 6 (six) hours as needed for wheezing or shortness of breath.     estradiol (CLIMARA) 0.0375 mg/24hr patch Place 1 patch (0.0375 mg total) onto the skin once a week. 4 patch 2   fexofenadine (ALLEGRA) 180 MG tablet Take 180 mg by mouth daily as needed for allergies or rhinitis.     acetaminophen (TYLENOL) 500 MG tablet Take 1,000 mg by mouth every 6 (six) hours as needed for moderate pain or headache.     ascorbic acid (VITAMIN C) 1000 MG tablet Take 1 tablet by mouth daily.     aspirin 81 MG EC tablet Take 1 tablet by mouth daily.     Calcium Carbonate-Vit D-Min (CALCIUM 600+D3 PLUS MINERALS) 600-800 MG-UNIT CHEW 1 tablet with a meal     cyclobenzaprine (FLEXERIL) 5 MG tablet Take 5 mg by mouth See admin instructions. Take 5 mg at bedtime, may take a second 5 mg dose in the middle of the night as needed for spasms     docusate sodium (COLACE) 100 MG capsule Take 500 mg by mouth at bedtime.     ergocalciferol (DRISDOL) 1.25 MG (50000 UT) capsule Take 1 capsule (50,000 Units total) by mouth once a week. 4 capsule 5   fluticasone furoate-vilanterol (BREO ELLIPTA) 100-25 MCG/ACT AEPB Inhale 1 puff into the lungs daily. 28 each 6   gabapentin (NEURONTIN) 300 MG capsule Take 1-2 capsules (300-600 mg total) by mouth 3 (three) times daily as needed (Nerve pain). 180 capsule 2   ibuprofen (ADVIL) 200 MG tablet Take 1 tablet by mouth as needed.     Misc Natural Products (BLACK CHERRY CONCENTRATE PO) Take 2 capsules by mouth daily.     omeprazole  (PRILOSEC) 20 MG capsule Take 1 capsule (20 mg total) by mouth daily before breakfast. 90 capsule 1   rosuvastatin (CRESTOR) 10 MG tablet Take 1 tablet (10 mg total) by mouth daily. 90 tablet 1   metoprolol succinate (TOPROL-XL) 50 MG 24 hr tablet Take 1 tablet (50 mg total) by mouth daily. Take with  or immediately following a meal. 30 tablet 2   No facility-administered medications prior to visit.   Review of Systems  Constitutional:  Negative for chills, fever, malaise/fatigue and weight loss.  HENT:  Negative for congestion, sinus pain and sore throat.   Eyes: Negative.   Respiratory:  Positive for shortness of breath. Negative for cough, hemoptysis, sputum production and wheezing.   Cardiovascular:  Negative for chest pain, palpitations, orthopnea, claudication and leg swelling.  Gastrointestinal:  Negative for abdominal pain, heartburn, nausea and vomiting.  Genitourinary: Negative.   Musculoskeletal:  Negative for joint pain and myalgias.  Skin:  Negative for rash.  Neurological:  Negative for weakness.  Endo/Heme/Allergies: Negative.   Psychiatric/Behavioral: Negative.     Objective:   Vitals:   02/03/22 0936  BP: 120/60  Pulse: 70  Temp: 98.1 F (36.7 C)  TempSrc: Oral  SpO2: 97%  Weight: 192 lb 9.6 oz (87.4 kg)  Height: '5\' 2"'$  (1.575 m)   Physical Exam Constitutional:      General: She is not in acute distress.    Appearance: She is obese. She is not ill-appearing.  HENT:     Head: Normocephalic and atraumatic.  Eyes:     General: No scleral icterus.    Conjunctiva/sclera: Conjunctivae normal.  Cardiovascular:     Rate and Rhythm: Normal rate and regular rhythm.     Pulses: Normal pulses.     Heart sounds: Normal heart sounds. No murmur heard. Pulmonary:     Effort: Pulmonary effort is normal.     Breath sounds: No decreased air movement. No decreased breath sounds, wheezing, rhonchi or rales.  Musculoskeletal:     Right lower leg: No edema.     Left lower  leg: No edema.  Skin:    General: Skin is warm and dry.  Neurological:     General: No focal deficit present.     Mental Status: She is alert.  Psychiatric:        Mood and Affect: Mood normal.        Behavior: Behavior normal.        Thought Content: Thought content normal.        Judgment: Judgment normal.    CBC    Component Value Date/Time   WBC 6.6 10/01/2021 0841   WBC 11.7 (H) 03/01/2021 0903   RBC 4.68 10/01/2021 0841   RBC 3.87 03/01/2021 0903   HGB 14.3 10/01/2021 0841   HCT 42.2 10/01/2021 0841   PLT 230 10/01/2021 0841   MCV 90 10/01/2021 0841   MCH 30.6 10/01/2021 0841   MCH 32.3 03/01/2021 0903   MCHC 33.9 10/01/2021 0841   MCHC 34.5 03/01/2021 0903   RDW 12.8 10/01/2021 0841   LYMPHSABS 2.0 10/01/2021 0841   MONOABS 1.4 (H) 03/01/2021 0903   EOSABS 0.1 10/01/2021 0841   BASOSABS 0.0 10/01/2021 0841      Latest Ref Rng & Units 10/01/2021    8:41 AM 05/31/2021   10:46 AM 03/08/2021    8:18 AM  BMP  Glucose 70 - 99 mg/dL 109  106  91   BUN 8 - 27 mg/dL '13  8  6   '$ Creatinine 0.57 - 1.00 mg/dL 0.74  0.76  0.60   BUN/Creat Ratio 12 - '28 18  11    '$ Sodium 134 - 144 mmol/L 141  142  134   Potassium 3.5 - 5.2 mmol/L 5.0  5.1  3.1   Chloride 96 - 106 mmol/L 103  105  103   CO2 20 - 29 mmol/L '24  24  23   '$ Calcium 8.7 - 10.3 mg/dL 9.3  9.6  7.8    Chest imaging: CTA Chest 05/17/21 1. Negative examination for pulmonary embolism. 2. Diffuse bilateral bronchial wall thickening and mild mosaic attenuation of the airspaces throughout. Findings are consistent with nonspecific infectious or inflammatory bronchitis and small airways disease. 3. Minimal paraseptal emphysema. 4. Background of very fine centrilobular pulmonary nodules, most concentrated at the lung apices, most commonly seen in smoking-related respiratory bronchiolitis. 5. Coronary artery disease.  PFT:    Latest Ref Rng & Units 08/28/2021   10:46 AM  PFT Results  FVC-Pre L 2.21   FVC-Predicted  Pre % 84   FVC-Post L 2.32   FVC-Predicted Post % 88   Pre FEV1/FVC % % 80   Post FEV1/FCV % % 80   FEV1-Pre L 1.78   FEV1-Predicted Pre % 90   FEV1-Post L 1.86   DLCO uncorrected ml/min/mmHg 20.18   DLCO UNC% % 112   DLCO corrected ml/min/mmHg 20.18   DLCO COR %Predicted % 112   DLVA Predicted % 109   TLC L 8.89   TLC % Predicted % 186   RV % Predicted % 294   PFT 2023: air trapping present  Labs:  Path:  Echo 04/16/21: Left ventricle cavity is normal in size. Moderate concentric hypertrophy  of the left ventricle. Normal global wall motion. Normal LV systolic  function with visual EF 55-60%. Doppler evidence of grade I (impaired)  diastolic dysfunction, normal LAP.  Trace TR.  No evidence of pulmonary hypertension.  Heart Catheterization:  Assessment & Plan:   Chronic obstructive pulmonary disease, unspecified COPD type ([place])  Former smoker  Moderate persistent asthma without complication  Discussion: Bernard Donahoo is a 76 year old woman, former smoker with GERD chronic back pain s/p anterior lateral interbody fusion of L2-3 and 3 4 with posterior spinal fixation on 02/27/2021 who returns to pulmonary clinic for COPD.   She has air trapping present on her PFTs and reduced 6MWT.  She does not appear to notice any benefit from the breo since last visit. We will try her on a nebulizer regimen of budesonide and LABA (brovana or performist) twice daily. DME orders placed today.   She can continue breo ellipta daily until her nebulizer setup is in place.   We will refer her to lung cancer screening team.   Follow-up in 3 months.  Freda Jackson, MD [facility] Office: [phone]   Current Outpatient Medications:    albuterol (VENTOLIN HFA) 108 (90 Base) MCG/ACT inhaler, Inhale 1-2 puffs into the lungs every 6 (six) hours as needed for wheezing or shortness of breath., Disp: , Rfl:    estradiol (CLIMARA) 0.0375 mg/24hr patch, Place 1 patch  (0.0375 mg total) onto the skin once a week., Disp: 4 patch, Rfl: 2   fexofenadine (ALLEGRA) 180 MG tablet, Take 180 mg by mouth daily as needed for allergies or rhinitis., Disp: , Rfl:    acetaminophen (TYLENOL) 500 MG tablet, Take 1,000 mg by mouth every 6 (six) hours as needed for moderate pain or headache., Disp: , Rfl:    ascorbic acid (VITAMIN C) 1000 MG tablet, Take 1 tablet by mouth daily., Disp: , Rfl:    aspirin 81 MG EC tablet, Take 1 tablet by mouth daily., Disp: , Rfl:    Calcium Carbonate-Vit D-Min (CALCIUM 600+D3 PLUS MINERALS) 600-800 MG-UNIT CHEW, 1 tablet  with a meal, Disp: , Rfl:    cyclobenzaprine (FLEXERIL) 5 MG tablet, Take 5 mg by mouth See admin instructions. Take 5 mg at bedtime, may take a second 5 mg dose in the middle of the night as needed for spasms, Disp: , Rfl:    docusate sodium (COLACE) 100 MG capsule, Take 500 mg by mouth at bedtime., Disp: , Rfl:    ergocalciferol (DRISDOL) 1.25 MG (50000 UT) capsule, Take 1 capsule (50,000 Units total) by mouth once a week., Disp: 4 capsule, Rfl: 5   fluticasone furoate-vilanterol (BREO ELLIPTA) 100-25 MCG/ACT AEPB, Inhale 1 puff into the lungs daily., Disp: 28 each, Rfl: 6   formoterol (PERFOROMIST) 20 MCG/2ML nebulizer solution, Take 2 mLs (20 mcg total) by nebulization 2 (two) times daily., Disp: 120 mL, Rfl: 8   gabapentin (NEURONTIN) 300 MG capsule, Take 1-2 capsules (300-600 mg total) by mouth 3 (three) times daily as needed (Nerve pain)., Disp: 180 capsule, Rfl: 2   ibuprofen (ADVIL) 200 MG tablet, Take 1 tablet by mouth as needed., Disp: , Rfl:    metoprolol succinate (TOPROL-XL) 50 MG 24 hr tablet, Take 1 tablet (50 mg total) by mouth daily. Take with or immediately following a meal., Disp: 30 tablet, Rfl: 2   Misc Natural Products (BLACK CHERRY CONCENTRATE PO), Take 2 capsules by mouth daily., Disp: , Rfl:    omeprazole (PRILOSEC) 20 MG capsule, Take 1 capsule (20 mg total) by mouth daily before breakfast., Disp: 90  capsule, Rfl: 1   rosuvastatin (CRESTOR) 10 MG tablet, Take 1 tablet (10 mg total) by mouth daily., Disp: 90 tablet, Rfl: 1

## 2022-02-05 ENCOUNTER — Other Ambulatory Visit: Payer: Self-pay

## 2022-02-05 ENCOUNTER — Telehealth: Payer: Self-pay

## 2022-02-05 DIAGNOSIS — R002 Palpitations: Secondary | ICD-10-CM

## 2022-02-05 DIAGNOSIS — R000 Tachycardia, unspecified: Secondary | ICD-10-CM

## 2022-02-05 MED ORDER — METOPROLOL SUCCINATE ER 50 MG PO TB24: 50.0000 mg | ORAL_TABLET | Freq: Every day | ORAL | 2 refills | Status: DC

## 2022-02-05 NOTE — Telephone Encounter (Signed)
Refilled medication tried calling patient to inform us call go this disconnected will try again

## 2022-02-05 NOTE — Telephone Encounter (Signed)
Yes would recommend continuing metoprolol unless she has concerns and we can address those if need be.

## 2022-02-10 ENCOUNTER — Telehealth: Payer: Self-pay | Admitting: Pulmonary Disease

## 2022-02-10 NOTE — Telephone Encounter (Signed)
Patients insurance will not cover Brovana, but they will cover performist.   Are you ok with changing this medication sir  Please advise

## 2022-02-11 MED ORDER — FORMOTEROL FUMARATE 20 MCG/2ML IN NEBU: 20.0000 ug | INHALATION_SOLUTION | Freq: Two times a day (BID) | RESPIRATORY_TRACT | 8 refills | Status: DC

## 2022-02-11 NOTE — Telephone Encounter (Signed)
Ok to change, thanks

## 2022-02-11 NOTE — Telephone Encounter (Signed)
Per ok from [place] changed medications. Medication placed. Nothing further needed

## 2022-02-14 ENCOUNTER — Telehealth: Payer: Self-pay | Admitting: Pulmonary Disease

## 2022-02-14 ENCOUNTER — Other Ambulatory Visit (HOSPITAL_COMMUNITY): Payer: Self-pay

## 2022-02-14 NOTE — Telephone Encounter (Signed)
Patient is calling to get Dr. Erin Fulling to send the order for her nebulizer medication. The medication he originally presribed her insurance will not cover. he was supposed to do a prescription for a different medication. The patient is not sure of the name of the medications

## 2022-02-14 NOTE — Telephone Encounter (Signed)
Janice Pace,  Is that the only nebulized medication that this patients insurance will cover?  Thanks

## 2022-02-14 NOTE — Telephone Encounter (Signed)
Patient called and states that insurance will not cover   Formoterol nebulizer solution.   Can we run a ticket to see what insurance will cover for patient.   Thank you ladies

## 2022-02-19 ENCOUNTER — Encounter (INDEPENDENT_AMBULATORY_CARE_PROVIDER_SITE_OTHER): Payer: Self-pay

## 2022-02-19 NOTE — Telephone Encounter (Signed)
Patient checking on nebulizer medication. Patient phone number is [phone].

## 2022-02-19 NOTE — Telephone Encounter (Signed)
Please advise on what all neb meds are covered by pt's insurance.

## 2022-02-20 ENCOUNTER — Other Ambulatory Visit (HOSPITAL_COMMUNITY): Payer: Self-pay

## 2022-02-20 NOTE — Telephone Encounter (Signed)
Charleston Poot, CPhT  You 14 minutes ago (8:53 AM)    Test billing for nebulizer products on this insurance plan returns the following co-pays with [facility]:   albuterol $4.65  xopenex $35.98  duoneb $4.23  budesonide $38.26  perforomist $178.23  arformoterol bill to Medicare Part B   Please note all test claims were billed as 60 nebules, 30 day use for pricing consistency.     Routing this info to Dr. Erin Fulling for him to review.

## 2022-02-21 NOTE — Telephone Encounter (Signed)
Called and spoke with [facility] rep. She confirmed that they have the RXs for budesonide and arformoterol. The copay for the budesonide is $38.28 but she was not able to find a copay for the arformoterol. She will have the patient service rep who processes the prescriptions to give me a call back once they run the insurance. Will keep this encounter open for follow up.

## 2022-02-21 NOTE — Telephone Encounter (Signed)
I want her on budesonide and arformoterol if possible. Use arformoterol bill to Medicare Part B.  Please see previous phone note where she reports Brovana or arformoterol is not covered by insurance, not sure if it wasn't ordered through medicare part B.  Thanks, JD

## 2022-02-21 NOTE — Telephone Encounter (Signed)
Received call back from [facility] rep. She stated that the arforomoterol had a copay for $178.20. She stated that the patient's insurance does not cover nebulizer machines and equipment so they would charge her a cash price of $62 ($60 for the machine, $2 for supplies). She confirmed that everything was ran under Medicare Part B.   Called patient to let her know but she did not answer. Left message for her to call back.

## 2022-03-11 NOTE — Telephone Encounter (Signed)
Patient called back and said she could not afford the meds from [facility] and would like to stay on the inhaler or see if there is a less expensive medication.  Please advise.

## 2022-03-20 ENCOUNTER — Other Ambulatory Visit: Payer: Self-pay | Admitting: Neurosurgery

## 2022-03-20 DIAGNOSIS — M5440 Lumbago with sciatica, unspecified side: Secondary | ICD-10-CM

## 2022-04-01 ENCOUNTER — Inpatient Hospital Stay: Admission: RE | Admit: 2022-04-01 | Payer: Medicare Other | Source: Ambulatory Visit

## 2022-04-11 NOTE — Telephone Encounter (Signed)
Addendum added with asthma diagnosis. Not sure how this will change billing.  JD

## 2022-04-11 NOTE — Telephone Encounter (Signed)
Received a call from [facility] stating that they have been trying to help pt out with her budesonide neb sol due to the cost. They currently have diagnosis of COPD and they stated that pt told them that she also has asthma. [facility] is needing an addendum to be done showing that pt does have a diagnosis of asthma if this is accurate so that way they should be able to get the cost down for the medication.  If this addendum can be done, after it is done the OV note will need to be faxed to [facility] at [phone].  Dr. Erin Fulling, please advise on this.

## 2022-04-14 NOTE — Telephone Encounter (Signed)
Office note has been faxed to [employer].

## 2022-04-29 ENCOUNTER — Ambulatory Visit
Admission: RE | Admit: 2022-04-29 | Discharge: 2022-04-29 | Disposition: A | Discharge status: Home / Self Care | Payer: Medicare Other | Source: Ambulatory Visit | Attending: Neurosurgery | Admitting: Neurosurgery

## 2022-04-29 ENCOUNTER — Other Ambulatory Visit: Payer: Medicare Other

## 2022-04-29 DIAGNOSIS — M5440 Lumbago with sciatica, unspecified side: Secondary | ICD-10-CM

## 2022-05-06 ENCOUNTER — Encounter: Payer: Self-pay | Admitting: Nurse Practitioner

## 2022-05-06 ENCOUNTER — Ambulatory Visit: Payer: Medicare Other | Admitting: Nurse Practitioner

## 2022-05-06 ENCOUNTER — Other Ambulatory Visit: Payer: Self-pay | Admitting: *Deleted

## 2022-05-06 ENCOUNTER — Ambulatory Visit: Payer: Medicare Other | Admitting: Pulmonary Disease

## 2022-05-06 VITALS — BP 124/72 | HR 71 | Ht 62.0 in | Wt 193.4 lb

## 2022-05-06 DIAGNOSIS — J449 Chronic obstructive pulmonary disease, unspecified: Secondary | ICD-10-CM | POA: Diagnosis not present

## 2022-05-06 DIAGNOSIS — J301 Allergic rhinitis due to pollen: Secondary | ICD-10-CM

## 2022-05-06 DIAGNOSIS — Z87891 Personal history of nicotine dependence: Secondary | ICD-10-CM

## 2022-05-06 DIAGNOSIS — J4489 Other specified chronic obstructive pulmonary disease: Secondary | ICD-10-CM | POA: Insufficient documentation

## 2022-05-06 DIAGNOSIS — Z122 Encounter for screening for malignant neoplasm of respiratory organs: Secondary | ICD-10-CM

## 2022-05-06 MED ORDER — ALBUTEROL SULFATE HFA 108 (90 BASE) MCG/ACT IN AERS: 1.0000 | INHALATION_SPRAY | Freq: Four times a day (QID) | RESPIRATORY_TRACT | 1 refills | Status: DC | PRN

## 2022-05-06 MED ORDER — BREZTRI AEROSPHERE 160-9-4.8 MCG/ACT IN AERO: 2.0000 | INHALATION_SPRAY | Freq: Two times a day (BID) | RESPIRATORY_TRACT | 0 refills | Status: DC

## 2022-05-06 NOTE — Assessment & Plan Note (Signed)
Controlled on current regimen. Continue allegra for trigger prevention

## 2022-05-06 NOTE — Progress Notes (Signed)
$'@Patient'c$  ID: Janice Pace, female    DOB: 05-05-1946, 76 y.o.   MRN: [id number]  Chief Complaint  Patient presents with   Follow-up    Pt f/u she is still experiencing SOB, wheezing. Inhalers do help some. She never started neb treatments due to medication cost    Referring provider: Lorrene Reid, PA-C  HPI: 76 year old female, former smoker followed for COPD with asthma. She is a patient of Dr. August Albino and last seen in office 02/03/2022. Past medical history significant for allergic rhinitis, GERD, HLD, obesity, prediabetes, anxiety.   TEST/EVENTS:  05/17/2021 CTA chest: No evidence of PE. No LAD. Diffuse b/l bronchial wall thickening and mild mosaic attenuation. Minimal paraseptal emphysema. Centrilobular pulmonary nodules. Findings consistent with smoking related bronchiolitis 08/28/2021 PFTs: FVC 84, FEV1 94, ratio 80, TLC 186, DLCOcor 112. No BD  02/03/2022: OV with Dr. Erin Fulling. Doesn't notice much benefit from [place] use. Wants to try nebulizer regimen - started on budesonide and LABA twice daily. Referred to lung cancer screening program.   05/06/2022: Today - follow up Patient presents today for follow up with her husband. At her last visit, she was changed from [facility] to budesonide/perforomist nebs. Today, she says that she never started the nebulizers because they were too expensive. She is still using the [facility]; feels like it does ok but she still struggles with some dyspnea. Her activity is mostly limited by her chronic back pain, which she is seeing Dr. Saintclair Halsted for. Her husband does notice that she wheezes at night. Denies any cough, chest congestion, orthopnea, PND, leg swelling.   Allergies  Allergen Reactions   Sulfa Antibiotics Hives and Itching   Atorvastatin Other (See Comments)    mucsle aches   Metformin Hcl Other (See Comments)    joint pains and muscle aches    Immunization History  Administered Date(s) Administered   Fluad Quad(high Dose 65+) 05/07/2021    Influenza Split 06/17/2012, 04/26/2014   Influenza, High Dose Seasonal PF 05/02/2015, 05/08/2016, 05/25/2017, 05/26/2018, 04/21/2019, 05/09/2020   PFIZER(Purple Top)SARS-COV-2 Vaccination 08/04/2019, 08/24/2019, 04/27/2020   Pneumococcal Conjugate-13 04/26/2014   Pneumococcal Polysaccharide-23 01/27/2012   Tdap 05/08/2016   Zoster Recombinat (Shingrix) 07/22/2018, 10/12/2018    Past Medical History:  Diagnosis Date   Arthritis    Bronchitis    Cancer ([place])    dermatosivros arcoma and protuverans   Chronic back pain    degenerative disc disease   Constipation    stool softener daily   Constipation    GERD (gastroesophageal reflux disease)    takes Prilosec daily   GERD (gastroesophageal reflux disease)    Heart murmur    Heart valve disorder    Hemorrhoids    History of hiatal hernia    Insomnia    d/t chantix   Joint pain    Nocturia    Personal history of colonic adenoma 03/13/2003   03/13/2003 - 5 mm adenoma   Pre-diabetes    Prediabetes     Tobacco History: Social History   Tobacco Use  Smoking Status Former   Packs/day: 1.00   Years: 50.00   Total pack years: 50.00   Types: Cigarettes   Quit date: 08/17/2011   Years since quitting: 10.7  Smokeless Tobacco Never   Counseling given: Not Answered   Outpatient Medications Prior to Visit  Medication Sig Dispense Refill   acetaminophen (TYLENOL) 500 MG tablet Take 1,000 mg by mouth every 6 (six) hours as needed for moderate pain or headache.  ascorbic acid (VITAMIN C) 1000 MG tablet Take 1 tablet by mouth daily.     aspirin 81 MG EC tablet Take 1 tablet by mouth daily.     Calcium Carbonate-Vit D-Min (CALCIUM 600+D3 PLUS MINERALS) 600-800 MG-UNIT CHEW 1 tablet with a meal     cyclobenzaprine (FLEXERIL) 5 MG tablet Take 5 mg by mouth See admin instructions. Take 5 mg at bedtime, may take a second 5 mg dose in the middle of the night as needed for spasms     cyclobenzaprine (FLEXERIL) 5 MG tablet Take 1 tablet  by mouth in the morning, at noon, and at bedtime.     docusate sodium (COLACE) 100 MG capsule Take 500 mg by mouth at bedtime.     fluticasone furoate-vilanterol (BREO ELLIPTA) 100-25 MCG/ACT AEPB Inhale 1 puff into the lungs daily. 28 each 6   gabapentin (NEURONTIN) 300 MG capsule Take 1-2 capsules (300-600 mg total) by mouth 3 (three) times daily as needed (Nerve pain). 180 capsule 2   ibuprofen (ADVIL) 200 MG tablet Take 1 tablet by mouth as needed.     metoprolol succinate (TOPROL-XL) 50 MG 24 hr tablet Take 1 tablet (50 mg total) by mouth daily. Take with or immediately following a meal. 30 tablet 2   omeprazole (PRILOSEC) 20 MG capsule Take 1 capsule (20 mg total) by mouth daily before breakfast. 90 capsule 1   rosuvastatin (CRESTOR) 10 MG tablet Take 1 tablet (10 mg total) by mouth daily. 90 tablet 1   albuterol (VENTOLIN HFA) 108 (90 Base) MCG/ACT inhaler Inhale 1-2 puffs into the lungs every 6 (six) hours as needed for wheezing or shortness of breath.     formoterol (PERFOROMIST) 20 MCG/2ML nebulizer solution Take 2 mLs (20 mcg total) by nebulization 2 (two) times daily. 120 mL 8   ergocalciferol (DRISDOL) 1.25 MG (50000 UT) capsule Take 1 capsule (50,000 Units total) by mouth once a week. 4 capsule 5   estradiol (CLIMARA) 0.0375 mg/24hr patch Place 1 patch (0.0375 mg total) onto the skin once a week. 4 patch 2   fexofenadine (ALLEGRA) 180 MG tablet Take 180 mg by mouth daily as needed for allergies or rhinitis.     Misc Natural Products (BLACK CHERRY CONCENTRATE PO) Take 2 capsules by mouth daily.     No facility-administered medications prior to visit.     Review of Systems:   Constitutional: No weight loss or gain, night sweats, fevers, chills, fatigue, or lassitude. HEENT: No headaches, difficulty swallowing, tooth/dental problems, or sore throat. No sneezing, itching, ear ache, nasal congestion, or post nasal drip CV:  No chest pain, orthopnea, PND, swelling in lower extremities,  anasarca, dizziness, palpitations, syncope Resp: + shortness of breath with exertion. No excess mucus or change in color of mucus. No productive or non-productive. No hemoptysis. No wheezing.  No chest wall deformity GI:  No heartburn, indigestion, abdominal pain, nausea, vomiting, diarrhea, change in bowel habits, loss of appetite, bloody stools.  MSK:  No joint pain or swelling.  No decreased range of motion.  +Chronic back pain. Neuro: No dizziness or lightheadedness.  Psych: No depression or anxiety. Mood stable.     Physical Exam:  BP 124/72   Pulse 71   Ht '5\' 2"'$  (1.575 m)   Wt 193 lb 6.4 oz (87.7 kg)   SpO2 98%   BMI 35.37 kg/m   GEN: Pleasant, interactive, well-appearing; obese; in no acute distress. HEENT:  Normocephalic and atraumatic. PERRLA. Sclera white. Nasal turbinates pink,  moist and patent bilaterally. No rhinorrhea present. Oropharynx pink and moist, without exudate or edema. No lesions, ulcerations, or postnasal drip.  NECK:  Supple w/ fair ROM. No JVD present. Normal carotid impulses w/o bruits. Thyroid symmetrical with no goiter or nodules palpated. No lymphadenopathy.   CV: RRR, no m/r/g, no peripheral edema. Pulses intact, +2 bilaterally. No cyanosis, pallor or clubbing. PULMONARY:  Unlabored, regular breathing. Clear bilaterally A&P w/o wheezes/rales/rhonchi. No accessory muscle use.  GI: BS present and normoactive. Soft, non-tender to palpation. No organomegaly or masses detected. MSK: No erythema, warmth or tenderness. No deformities or joint swelling noted.  Neuro: A/Ox3. No focal deficits noted.   Skin: Warm, no lesions or rashe Psych: Normal affect and behavior. Judgement and thought content appropriate.     Lab Results:  CBC    Component Value Date/Time   WBC 6.6 10/01/2021 0841   WBC 11.7 (H) 03/01/2021 0903   RBC 4.68 10/01/2021 0841   RBC 3.87 03/01/2021 0903   HGB 14.3 10/01/2021 0841   HCT 42.2 10/01/2021 0841   PLT 230 10/01/2021 0841    MCV 90 10/01/2021 0841   MCH 30.6 10/01/2021 0841   MCH 32.3 03/01/2021 0903   MCHC 33.9 10/01/2021 0841   MCHC 34.5 03/01/2021 0903   RDW 12.8 10/01/2021 0841   LYMPHSABS 2.0 10/01/2021 0841   MONOABS 1.4 (H) 03/01/2021 0903   EOSABS 0.1 10/01/2021 0841   BASOSABS 0.0 10/01/2021 0841    BMET    Component Value Date/Time   NA 141 10/01/2021 0841   K 5.0 10/01/2021 0841   CL 103 10/01/2021 0841   CO2 24 10/01/2021 0841   GLUCOSE 109 (H) 10/01/2021 0841   GLUCOSE 91 03/08/2021 0818   BUN 13 10/01/2021 0841   CREATININE 0.74 10/01/2021 0841   CALCIUM 9.3 10/01/2021 0841   GFRNONAA >60 03/08/2021 0818   GFRAA 100 04/24/2020 1600    BNP No results found for: "BNP"   Imaging:  CT LUMBAR SPINE WO CONTRAST  Result Date: 04/30/2022 CLINICAL DATA:  76 year old female with persistent low back pain. Prior lumbar surgeries, most recently 1 year ago. EXAM: CT LUMBAR SPINE WITHOUT CONTRAST TECHNIQUE: Multidetector CT imaging of the lumbar spine was performed without intravenous contrast administration. Multiplanar CT image reconstructions were also generated. RADIATION DOSE REDUCTION: This exam was performed according to the departmental dose-optimization program which includes automated exposure control, adjustment of the mA and/or kV according to patient size and/or use of iterative reconstruction technique. COMPARISON:  Lumbar spine CT myelogram 06/27/2020. CT Abdomen and Pelvis 03/07/2021. FINDINGS: Segmentation: Transitional lumbosacral anatomy, hypoplastic ribs designated at T12 as on the 2021 CT myelogram resulting in otherwise normal lumbar segmentation. Correlation with radiographs is recommended prior to any operative intervention. Alignment: Lumbar lordosis has not significantly changed since 2021. No significant scoliosis. Vertebrae: Postoperative details are below. Some underlying osteopenia. No acute osseous abnormality identified. Visible sacrum and SI joints appear intact.  Paraspinal and other soft tissues: Negative visible costophrenic angles. Aortoiliac calcified atherosclerosis. Negative other visible noncontrast abdominal viscera. Postoperative changes to the lumbar paraspinal soft tissues, stable from the CT Abdomen and Pelvis last year. No paraspinal fluid collection. Disc levels: T11-T12: Mild partially calcified disc bulging.  No stenosis. T12-L1: Chronic circumferential and left paracentral partially calcified disc and endplate spurring appears mildly increased since the previous CT myelogram, with evidence of new mild spinal and up to moderate left lateral recess stenosis at that level (left L1 nerve level series 3, image 26). No  significant foraminal stenosis. L1-L2: Bilateral pedicle screws were in place last year, new since 2021. Chronic severe underlying L1-L2 disc and endplate degeneration with bulky endplate spurring and sclerosis. But regressed vacuum disc since 2021. No evidence of screw loosening. And there is evidence of posterior element arthrodesis (on the left series 8, image 58). Underlying circumferential disc osteophyte complex is eccentric to the right and stable to improved since 2021. No significant L1 foraminal stenosis. L2-L3: Posterior and interbody fusion is new since 2021, stable from last year. Evidence of solid arthrodesis. No hardware loosening. Residual endplate and posterior element hypertrophy, but no convincing stenosis. L3-L4: Prior decompression and fusion with posterior and interbody hardware, new since 2021 and stable from last year. Evidence of developing interbody arthrodesis at this level although probably no solid bridging bone (series 7, image 32). No hardware loosening. Residual endplate spurring. No definite stenosis. L4-L5: Chronic decompression and fusion. Stable hardware since 2021. Solid posterior element arthrodesis. Residual endplate spurring but no significant stenosis. L5-S1: Chronic decompression and fusion. Stable hardware  since 2021. Solid arthrodesis. No stenosis. IMPRESSION: 1. Mildly transitional lumbosacral anatomy with hypoplastic ribs designated at T12 as on a 2021 CT myelogram. 2. Cephalad extension of chronic lumbar fusion since 2021, now to the L1 level: No evidence of hardware loosening. Evidence of solid arthrodesis at each level except L3-L4 - but developing arthrodesis with incomplete solid bridging bone is suspected there. No convincing residual stenosis at those levels. 3. Adjacent segment disease at T12-L1 where left paracentral partially calcified disc appears progressed since the 2021 myelogram, with moderate left lateral recess and mild spinal stenosis suspected there now. Query left L1 radiculitis. 4. Aortic Atherosclerosis (ICD10-I70.0). Electronically Signed   By: Genevie Ann M.D.   On: 04/30/2022 16:00         Latest Ref Rng & Units 08/28/2021   10:46 AM  PFT Results  FVC-Pre L 2.21   FVC-Predicted Pre % 84   FVC-Post L 2.32   FVC-Predicted Post % 88   Pre FEV1/FVC % % 80   Post FEV1/FCV % % 80   FEV1-Pre L 1.78   FEV1-Predicted Pre % 90   FEV1-Post L 1.86   DLCO uncorrected ml/min/mmHg 20.18   DLCO UNC% % 112   DLCO corrected ml/min/mmHg 20.18   DLCO COR %Predicted % 112   DLVA Predicted % 109   TLC L 8.89   TLC % Predicted % 186   RV % Predicted % 294     No results found for: "NITRICOXIDE"      Assessment & Plan:   COPD with asthma COPD with asthma. Not optimized on current regimen; however, unclear how much is related to her breathing vs chronic back pain. She does have some nocturnal bronchospasm per her husband's report. We will try addition of LAMA therapy and change to [facility]. Provided with samples today. Continue prn albuterol. Asthma action plan in place.  Patient Instructions  Stop Breo. Trial Breztri 2 puffs Twice daily. Brush tongue and rinse mouth afterwards. If you don't notice an improvement, you can go back to your Breo. If you like the [facility], call me for a  prescription. Use with spacer. Continue Albuterol inhaler 2 puffs every 6 hours as needed for shortness of breath or wheezing. Notify if symptoms persist despite rescue inhaler/neb use. Continue Allegra 1 tab daily Continue omeprazole 1 tab daily  Someone from the lung cancer screening program should be reaching out to schedule your next CT scan   Follow  up in 3 months with Dr. Erin Fulling. If symptoms do not improve or worsen, please contact office for sooner follow up or seek emergency care.     Allergic rhinitis due to pollen Controlled on current regimen. Continue allegra for trigger prevention   Former smoker Former smoker with 50 pack year history; quit 2013. Reached out to lung cancer screening program as she is due for CT in November 2023; they will contact her for scheduling.    I spent 35 minutes of dedicated to the care of this patient on the date of this encounter to include pre-visit review of records, face-to-face time with the patient discussing conditions above, post visit ordering of testing, clinical documentation with the electronic health record, making appropriate referrals as documented, and communicating necessary findings to members of the patients care team.  Clayton Bibles, NP 05/06/2022  Pt aware and understands NP's role.

## 2022-05-06 NOTE — Patient Instructions (Addendum)
Stop Breo. Trial Breztri 2 puffs Twice daily. Brush tongue and rinse mouth afterwards. If you don't notice an improvement, you can go back to your Breo. If you like the [facility], call me for a prescription. Use with spacer. Continue Albuterol inhaler 2 puffs every 6 hours as needed for shortness of breath or wheezing. Notify if symptoms persist despite rescue inhaler/neb use. Continue Allegra 1 tab daily Continue omeprazole 1 tab daily  Someone from the lung cancer screening program should be reaching out to schedule your next CT scan   Follow up in 3 months with Dr. Erin Fulling. If symptoms do not improve or worsen, please contact office for sooner follow up or seek emergency care.

## 2022-05-06 NOTE — Assessment & Plan Note (Signed)
COPD with asthma. Not optimized on current regimen; however, unclear how much is related to her breathing vs chronic back pain. She does have some nocturnal bronchospasm per her husband's report. We will try addition of LAMA therapy and change to [facility]. Provided with samples today. Continue prn albuterol. Asthma action plan in place.  Patient Instructions  Stop Breo. Trial Breztri 2 puffs Twice daily. Brush tongue and rinse mouth afterwards. If you don't notice an improvement, you can go back to your Breo. If you like the [facility], call me for a prescription. Use with spacer. Continue Albuterol inhaler 2 puffs every 6 hours as needed for shortness of breath or wheezing. Notify if symptoms persist despite rescue inhaler/neb use. Continue Allegra 1 tab daily Continue omeprazole 1 tab daily  Someone from the lung cancer screening program should be reaching out to schedule your next CT scan   Follow up in 3 months with Dr. Erin Fulling. If symptoms do not improve or worsen, please contact office for sooner follow up or seek emergency care.

## 2022-05-06 NOTE — Assessment & Plan Note (Signed)
Former smoker with 50 pack year history; quit 2013. Reached out to lung cancer screening program as she is due for CT in November 2023; they will contact her for scheduling.

## 2022-05-20 ENCOUNTER — Telehealth: Payer: Self-pay | Admitting: Pulmonary Disease

## 2022-05-20 ENCOUNTER — Ambulatory Visit (INDEPENDENT_AMBULATORY_CARE_PROVIDER_SITE_OTHER): Payer: Medicare Other | Admitting: Physician Assistant

## 2022-05-20 ENCOUNTER — Other Ambulatory Visit: Payer: Self-pay

## 2022-05-20 ENCOUNTER — Encounter: Payer: Self-pay | Admitting: Physician Assistant

## 2022-05-20 VITALS — BP 145/78 | HR 81 | Resp 20 | Ht 61.81 in | Wt 191.2 lb

## 2022-05-20 DIAGNOSIS — B9689 Other specified bacterial agents as the cause of diseases classified elsewhere: Secondary | ICD-10-CM

## 2022-05-20 DIAGNOSIS — Z130 Encounter for screening for diseases of the blood and blood-forming organs and certain disorders involving the immune mechanism: Secondary | ICD-10-CM

## 2022-05-20 DIAGNOSIS — R051 Acute cough: Secondary | ICD-10-CM | POA: Diagnosis not present

## 2022-05-20 DIAGNOSIS — J988 Other specified respiratory disorders: Secondary | ICD-10-CM | POA: Diagnosis not present

## 2022-05-20 DIAGNOSIS — J449 Chronic obstructive pulmonary disease, unspecified: Secondary | ICD-10-CM

## 2022-05-20 DIAGNOSIS — Z0000 Encounter for general adult medical examination without abnormal findings: Secondary | ICD-10-CM

## 2022-05-20 MED ORDER — DOXYCYCLINE HYCLATE 100 MG PO TABS: 100.0000 mg | ORAL_TABLET | Freq: Two times a day (BID) | ORAL | 0 refills | Status: DC

## 2022-05-20 NOTE — Progress Notes (Signed)
Established patient acute visit   Patient: Janice Pace   DOB: 1946/04/29   76 y.o. Female  MRN: [id number] Visit Date: 05/20/2022  Chief Complaint  Patient presents with   Cough        Subjective    HPI HPI     Cough    Additional comments:        Last edited by Gemma Payor, CMA on 05/20/2022 11:21 AM.      Patient presents with c/o productive cough with green sputum for 1 week. States her symptoms started after starting the new inhaler- Breztri. Unsure about a fever. Has chest congestion. Mild headache. Does report chills last night. Denies chest pain, palpitations, shortness of breath, wheezing, sinus pressure or runny nose. Taken Tylenol as needed. Denies sick contact exposures. Feeling more tired and worse today.    Medications: Outpatient Medications Prior to Visit  Medication Sig   acetaminophen (TYLENOL) 500 MG tablet Take 1,000 mg by mouth every 6 (six) hours as needed for moderate pain or headache.   albuterol (VENTOLIN HFA) 108 (90 Base) MCG/ACT inhaler Inhale 1-2 puffs into the lungs every 6 (six) hours as needed for wheezing or shortness of breath.   ascorbic acid (VITAMIN C) 1000 MG tablet Take 1 tablet by mouth daily.   aspirin 81 MG EC tablet Take 1 tablet by mouth daily.   Budeson-Glycopyrrol-Formoterol (BREZTRI AEROSPHERE) 160-9-4.8 MCG/ACT AERO Inhale 2 puffs into the lungs in the morning and at bedtime.   Calcium Carbonate-Vit D-Min (CALCIUM 600+D3 PLUS MINERALS) 600-800 MG-UNIT CHEW 1 tablet with a meal   cyclobenzaprine (FLEXERIL) 5 MG tablet Take 5 mg by mouth See admin instructions. Take 5 mg at bedtime, may take a second 5 mg dose in the middle of the night as needed for spasms   docusate sodium (COLACE) 100 MG capsule Take 500 mg by mouth at bedtime.   fluticasone furoate-vilanterol (BREO ELLIPTA) 100-25 MCG/ACT AEPB Inhale 1 puff into the lungs daily.   gabapentin (NEURONTIN) 300 MG capsule Take 1-2 capsules (300-600 mg total) by mouth 3  (three) times daily as needed (Nerve pain).   ibuprofen (ADVIL) 200 MG tablet Take 1 tablet by mouth as needed.   omeprazole (PRILOSEC) 20 MG capsule Take 1 capsule (20 mg total) by mouth daily before breakfast.   rosuvastatin (CRESTOR) 10 MG tablet Take 1 tablet (10 mg total) by mouth daily.   cyclobenzaprine (FLEXERIL) 5 MG tablet Take 1 tablet by mouth in the morning, at noon, and at bedtime.   ergocalciferol (DRISDOL) 1.25 MG (50000 UT) capsule Take 1 capsule (50,000 Units total) by mouth once a week.   estradiol (CLIMARA) 0.0375 mg/24hr patch Place 1 patch (0.0375 mg total) onto the skin once a week.   fexofenadine (ALLEGRA) 180 MG tablet Take 180 mg by mouth daily as needed for allergies or rhinitis.   metoprolol succinate (TOPROL-XL) 50 MG 24 hr tablet Take 1 tablet (50 mg total) by mouth daily. Take with or immediately following a meal.   Misc Natural Products (BLACK CHERRY CONCENTRATE PO) Take 2 capsules by mouth daily.   No facility-administered medications prior to visit.    Review of Systems Review of Systems:  A fourteen system review of systems was performed and found to be positive as per HPI.     Objective    BP (!) 145/78 (BP Location: Left Arm, Patient Position: Sitting, Cuff Size: Large)   Pulse 81   Resp 20   Ht 5' 1.81" (1.57  m)   Wt 191 lb 3.2 oz (86.7 kg)   SpO2 97%   BMI 35.19 kg/m    Physical Exam  General: cooperative, in no acute distress, appropriate for stated age.  Neuro:  Alert and oriented,  extra-ocular muscles intact  HEENT:  Normocephalic, atraumatic, PERRL, no sinus tenderness, normal TM's of both ears, boggy turbinates, mild erythema of posterior oropharynx without exudates, uvula midline, no neck supple, +anterior cervical adenopathy   Skin:  no gross rash, warm, pink. Cardiac:  RRR, S1 S2 Respiratory: No wheezing, rhonchi, crackles or rales. There is some dec air movement at lung bases. Vascular:  Ext warm, no cyanosis apprec.; cap RF less  2 sec. Psych:  No HI/SI, judgement and insight good, Euthymic mood. Full Affect.   No results found for any visits on 05/20/22.  Assessment & Plan     Advised to hold Breztri and contact her pulmonologist- common side effects include URI, cough, sinusitis, pneumonia, etc. Due to worsening symptoms which have been ongoing for >7 days will start oral antibiotic therapy with doxycyline 100 mg BID x 7 days for suspected bacterial LRI. Patient reports is planning to have a back injection soon, pt unsure what kind of injection so will hold off on oral corticosteroid at this time. If symptoms fail to improve or worsen recommend obtaining a chest x-ray and labs- CBC, CMP.   Return if symptoms worsen or fail to improve.        Lorrene Reid, PA-C  [facility] [phone] (phone) [phone] (fax)  [place]

## 2022-05-20 NOTE — Telephone Encounter (Signed)
Called and spoke to patient and she states that she stopped the [place] today due to she believes it gave her PNA. She states that her PCP gave her doxycycline for her PNA. She is wanting a different inhaler.  Please advise sir

## 2022-05-20 NOTE — Telephone Encounter (Signed)
She can go back on the Breo inhaler she was on before, otherwise Memory Dance carries similar risk of pneumonia.   Thanks, JD

## 2022-05-21 ENCOUNTER — Other Ambulatory Visit: Payer: Medicare Other

## 2022-05-21 DIAGNOSIS — Z1321 Encounter for screening for nutritional disorder: Secondary | ICD-10-CM

## 2022-05-21 DIAGNOSIS — Z0000 Encounter for general adult medical examination without abnormal findings: Secondary | ICD-10-CM

## 2022-05-21 MED ORDER — FLUTICASONE FUROATE-VILANTEROL 100-25 MCG/ACT IN AEPB: 1.0000 | INHALATION_SPRAY | Freq: Every day | RESPIRATORY_TRACT | 6 refills | Status: DC

## 2022-05-21 NOTE — Telephone Encounter (Signed)
Called and spoke to patient and advised her that Dr Erin Fulling states she can go back to her Breo inhaler. She verbalized understanding. While I had her on the phone I asked if she needed refills on her Memory Dance and she stated yes. Verified pharmacy. Nothing further needed

## 2022-05-22 LAB — CBC WITH DIFFERENTIAL/PLATELET
Basophils Absolute: 0 10*3/uL (ref 0.0–0.2)
Basos: 1 %
EOS (ABSOLUTE): 0.2 10*3/uL (ref 0.0–0.4)
Eos: 2 %
Hematocrit: 40.4 % (ref 34.0–46.6)
Hemoglobin: 13.5 g/dL (ref 11.1–15.9)
Immature Grans (Abs): 0 10*3/uL (ref 0.0–0.1)
Immature Granulocytes: 1 %
Lymphocytes Absolute: 1.9 10*3/uL (ref 0.7–3.1)
Lymphs: 30 %
MCH: 30.5 pg (ref 26.6–33.0)
MCHC: 33.4 g/dL (ref 31.5–35.7)
MCV: 91 fL (ref 79–97)
Monocytes Absolute: 0.5 10*3/uL (ref 0.1–0.9)
Monocytes: 8 %
Neutrophils Absolute: 3.8 10*3/uL (ref 1.4–7.0)
Neutrophils: 58 %
Platelets: 207 10*3/uL (ref 150–450)
RBC: 4.42 x10E6/uL (ref 3.77–5.28)
RDW: 13.1 % (ref 11.7–15.4)
WBC: 6.4 10*3/uL (ref 3.4–10.8)

## 2022-05-22 LAB — COMPREHENSIVE METABOLIC PANEL
ALT: 26 IU/L (ref 0–32)
AST: 20 IU/L (ref 0–40)
Albumin/Globulin Ratio: 2.3 — ABNORMAL HIGH (ref 1.2–2.2)
Albumin: 4.2 g/dL (ref 3.8–4.8)
Alkaline Phosphatase: 89 IU/L (ref 44–121)
BUN/Creatinine Ratio: 16 (ref 12–28)
BUN: 12 mg/dL (ref 8–27)
Bilirubin Total: 0.6 mg/dL (ref 0.0–1.2)
CO2: 23 mmol/L (ref 20–29)
Calcium: 9.1 mg/dL (ref 8.7–10.3)
Chloride: 105 mmol/L (ref 96–106)
Creatinine, Ser: 0.77 mg/dL (ref 0.57–1.00)
Globulin, Total: 1.8 g/dL (ref 1.5–4.5)
Glucose: 103 mg/dL — ABNORMAL HIGH (ref 70–99)
Potassium: 5 mmol/L (ref 3.5–5.2)
Sodium: 141 mmol/L (ref 134–144)
Total Protein: 6 g/dL (ref 6.0–8.5)
eGFR: 80 mL/min/{1.73_m2} (ref >59)

## 2022-05-22 LAB — HEMOGLOBIN A1C
Est. average glucose Bld gHb Est-mCnc: 117 mg/dL
Hgb A1c MFr Bld: 5.7 % — ABNORMAL HIGH (ref 4.8–5.6)

## 2022-05-22 LAB — LIPID PANEL
Chol/HDL Ratio: 2.7 ratio (ref 0.0–4.4)
Cholesterol, Total: 106 mg/dL (ref 100–199)
HDL: 39 mg/dL — ABNORMAL LOW
LDL Chol Calc (NIH): 48 mg/dL (ref 0–99)
Triglycerides: 98 mg/dL (ref 0–149)
VLDL Cholesterol Cal: 19 mg/dL (ref 5–40)

## 2022-05-22 LAB — TSH: TSH: 2.08 u[IU]/mL (ref 0.450–4.500)

## 2022-05-28 ENCOUNTER — Ambulatory Visit (INDEPENDENT_AMBULATORY_CARE_PROVIDER_SITE_OTHER): Payer: Medicare Other | Admitting: Nurse Practitioner

## 2022-05-28 ENCOUNTER — Encounter: Payer: Self-pay | Admitting: Nurse Practitioner

## 2022-05-28 VITALS — BP 119/67 | HR 82 | Ht 61.81 in | Wt 195.0 lb

## 2022-05-28 DIAGNOSIS — Z1231 Encounter for screening mammogram for malignant neoplasm of breast: Secondary | ICD-10-CM

## 2022-05-28 DIAGNOSIS — E7849 Other hyperlipidemia: Secondary | ICD-10-CM | POA: Diagnosis not present

## 2022-05-28 DIAGNOSIS — Z0000 Encounter for general adult medical examination without abnormal findings: Secondary | ICD-10-CM | POA: Diagnosis not present

## 2022-05-28 DIAGNOSIS — Z23 Encounter for immunization: Secondary | ICD-10-CM | POA: Diagnosis not present

## 2022-05-28 DIAGNOSIS — E2839 Other primary ovarian failure: Secondary | ICD-10-CM

## 2022-05-28 DIAGNOSIS — K219 Gastro-esophageal reflux disease without esophagitis: Secondary | ICD-10-CM | POA: Diagnosis not present

## 2022-05-28 DIAGNOSIS — F411 Generalized anxiety disorder: Secondary | ICD-10-CM

## 2022-05-28 DIAGNOSIS — Z13820 Encounter for screening for osteoporosis: Secondary | ICD-10-CM

## 2022-05-28 HISTORY — DX: Other primary ovarian failure: E28.39

## 2022-05-28 MED ORDER — OMEPRAZOLE 20 MG PO CPDR: 20.0000 mg | DELAYED_RELEASE_CAPSULE | Freq: Every day | ORAL | 1 refills | Status: DC

## 2022-05-28 MED ORDER — CHLORDIAZEPOXIDE HCL 10 MG PO CAPS: 10.0000 mg | ORAL_CAPSULE | Freq: Two times a day (BID) | ORAL | 2 refills | Status: DC | PRN

## 2022-05-28 MED ORDER — ROSUVASTATIN CALCIUM 10 MG PO TABS: 10.0000 mg | ORAL_TABLET | Freq: Every day | ORAL | 1 refills | Status: DC

## 2022-05-28 NOTE — Progress Notes (Signed)
Subjective:   Janice Pace is a 76 y.o. female who presents for Medicare Annual (Subsequent) preventive examination. -mild elevation of HgbA1c  -low HDL -would like to get her flu shot today -does have chronic lower back pain.  -has had multiple surgeries on lumbar spine  -continues to see neurosurgeon for management.  -seeing pulmonologist  -seeing cardiology   Review of Systems    Review of Systems  Constitutional:  Negative for chills, fever and malaise/fatigue.  HENT:  Negative for congestion, sinus pain and sore throat.   Eyes: Negative.   Respiratory:  Negative for cough, shortness of breath and wheezing.   Cardiovascular:  Negative for chest pain, palpitations and leg swelling.  Gastrointestinal:  Negative for constipation, diarrhea, nausea and vomiting.  Genitourinary: Negative.   Musculoskeletal:  Positive for back pain and myalgias.       Chronic low back pain for which she does see neurosurgery  Skin: Negative.   Neurological:  Negative for dizziness and headaches.  Endo/Heme/Allergies:  Does not bruise/bleed easily.  Psychiatric/Behavioral:  Negative for depression. The patient is not nervous/anxious.           Objective:    Today's Vitals   05/28/22 0849 05/28/22 1256  BP: 119/67   Pulse: 82   SpO2: 97%   Weight: 195 lb (88.5 kg)   Height: 5' 1.81" (1.57 m)   PainSc:  5    Body mass index is 35.89 kg/m.    Row Labels 03/02/2021    8:00 AM 02/19/2021    8:35 AM 09/02/2011    9:12 AM  Advanced Directives   Section Header. No data exists in this row.     Does Patient Have a Medical Advance Directive?   Yes Yes Patient does not have advance directive;Patient would like information  Type of Advance Directive   [place];Living will [place];Living will   Does patient want to make changes to medical advance directive?   No - Patient declined    Copy of [place] in Chart?   Yes - validated most  recent copy scanned in chart (See row information) Yes - validated most recent copy scanned in chart (See row information)   Would patient like information on creating a medical advance directive?     Advance directive packet given  Pre-existing out of facility DNR order (yellow form or pink MOST form)     No    Current Medications (verified) Outpatient Encounter Medications as of 05/28/2022  Medication Sig   acetaminophen (TYLENOL) 500 MG tablet Take 1,000 mg by mouth every 6 (six) hours as needed for moderate pain or headache.   albuterol (VENTOLIN HFA) 108 (90 Base) MCG/ACT inhaler Inhale 1-2 puffs into the lungs every 6 (six) hours as needed for wheezing or shortness of breath.   ascorbic acid (VITAMIN C) 1000 MG tablet Take 1 tablet by mouth daily.   aspirin 81 MG EC tablet Take 1 tablet by mouth daily.   Calcium Carbonate-Vit D-Min (CALCIUM 600+D3 PLUS MINERALS) 600-800 MG-UNIT CHEW 1 tablet with a meal   chlordiazePOXIDE (LIBRIUM) 10 MG capsule Take 1 capsule (10 mg total) by mouth 2 (two) times daily as needed for anxiety.   cyclobenzaprine (FLEXERIL) 5 MG tablet Take 5 mg by mouth See admin instructions. Take 5 mg at bedtime, may take a second 5 mg dose in the middle of the night as needed for spasms   docusate sodium (COLACE) 100 MG  capsule Take 500 mg by mouth at bedtime.   fluticasone furoate-vilanterol (BREO ELLIPTA) 100-25 MCG/ACT AEPB Inhale 1 puff into the lungs daily.   gabapentin (NEURONTIN) 300 MG capsule Take 1-2 capsules (300-600 mg total) by mouth 3 (three) times daily as needed (Nerve pain).   ibuprofen (ADVIL) 200 MG tablet Take 1 tablet by mouth as needed.   [DISCONTINUED] omeprazole (PRILOSEC) 20 MG capsule Take 1 capsule (20 mg total) by mouth daily before breakfast.   [DISCONTINUED] rosuvastatin (CRESTOR) 10 MG tablet Take 1 tablet (10 mg total) by mouth daily.   metoprolol succinate (TOPROL-XL) 50 MG 24 hr tablet Take 1 tablet (50 mg total) by mouth daily. Take with  or immediately following a meal.   omeprazole (PRILOSEC) 20 MG capsule Take 1 capsule (20 mg total) by mouth daily before breakfast.   rosuvastatin (CRESTOR) 10 MG tablet Take 1 tablet (10 mg total) by mouth daily.   [DISCONTINUED] doxycycline (VIBRA-TABS) 100 MG tablet Take 1 tablet (100 mg total) by mouth 2 (two) times daily.   No facility-administered encounter medications on file as of 05/28/2022.    Allergies (verified) Sulfa antibiotics, Atorvastatin, and Metformin hcl   History: Past Medical History:  Diagnosis Date   Arthritis    Bronchitis    Cancer ([place])    dermatosivros arcoma and protuverans   Chronic back pain    degenerative disc disease   Constipation    stool softener daily   Constipation    GERD (gastroesophageal reflux disease)    takes Prilosec daily   GERD (gastroesophageal reflux disease)    Heart murmur    Heart valve disorder    Hemorrhoids    History of hiatal hernia    Insomnia    d/t chantix   Joint pain    Nocturia    Personal history of colonic adenoma 03/13/2003   03/13/2003 - 5 mm adenoma   Pre-diabetes    Prediabetes    Past Surgical History:  Procedure Laterality Date   ABDOMINAL HYSTERECTOMY     ANTERIOR LAT LUMBAR FUSION N/A 02/27/2021   Procedure: Anterior lateral interbody fusion - lateral two - lateral three - lateral three - lateral four with exploration fusion L4-S1;  Surgeon: Kary Kos, MD;  Location: [facility];  Service: Neurosurgery;  Laterality: N/A;   BACK SURGERY  1988   BACK SURGERY  09-03-11   rods,screws x 8    BUNIONECTOMY     bilateral   BUNIONECTOMY     bil feet   COLONOSCOPY     dermatosibrosarcoma protuberans     LAMINECTOMY WITH POSTERIOR LATERAL ARTHRODESIS LEVEL 3 N/A 02/27/2021   Procedure: Posterior augmentation with pedicle screws Latreal one - lateral four - Posterior Lateral and Interbody fusion;  Surgeon: Kary Kos, MD;  Location: [facility];  Service: Neurosurgery;  Laterality: N/A;   PARTIAL HYSTERECTOMY      RECTOCELE REPAIR     Rectum Repair     RESECTION TUMOR WRIST RADICAL     x 2 rt   TUBAL LIGATION  1974   TUBAL LIGATION     WRIST SURGERY     left   Family History  Problem Relation Age of Onset   Leukemia Mother    Anesthesia problems Neg Hx    Hypotension Neg Hx    Malignant hyperthermia Neg Hx    Pseudochol deficiency Neg Hx    Colon cancer Neg Hx    Social History   Socioeconomic History   Marital status: Married  Spouse name: david   Number of children: 3   Years of education: Not on file   Highest education level: Not on file  Occupational History   Occupation: retired  Tobacco Use   Smoking status: Former    Packs/day: 1.00    Years: 50.00    Total pack years: 50.00    Types: Cigarettes    Quit date: 08/17/2011    Years since quitting: 10.7   Smokeless tobacco: Never  Vaping Use   Vaping Use: Never used  Substance and Sexual Activity   Alcohol use: No    Comment: occasional wine   Drug use: No   Sexual activity: Yes    Birth control/protection: Surgical, Other-see comments  Other Topics Concern   Not on file  Social History Narrative   ** Merged History Encounter **       Social Determinants of Health   Financial Resource Strain: Low Risk  (05/28/2022)   Overall Financial Resource Strain (CARDIA)    Difficulty of Paying Living Expenses: Not hard at all  Food Insecurity: No Food Insecurity (05/28/2022)   Hunger Vital Sign    Worried About Running Out of Food in the Last Year: Never true    [place] in the Last Year: Never true  Transportation Needs: No Transportation Needs (05/28/2022)   PRAPARE - Hydrologist (Medical): No    Lack of Transportation (Non-Medical): No  Physical Activity: Inactive (05/28/2022)   Exercise Vital Sign    Days of Exercise per Week: 0 days    Minutes of Exercise per Session: 0 min  Stress: No Stress Concern Present (05/28/2022)   [place]    Feeling of Stress : Only a little  Social Connections: Socially Integrated (05/28/2022)   Social Connection and Isolation Panel [NHANES]    Frequency of Communication with Friends and Family: More than three times a week    Frequency of Social Gatherings with Friends and Family: Once a week    Attends Religious Services: 1 to 4 times per year    Active Member of [employer] or Organizations: Yes    Attends Archivist Meetings: 1 to 4 times per year    Marital Status: Married    Tobacco Counseling Counseling given: Not Answered   Clinical Intake:  Pre-visit preparation completed: Yes  Pain : 0-10 Pain Score: 5  Pain Type: Chronic pain, Neuropathic pain Pain Location: Back Pain Descriptors / Indicators: Aching, Constant Pain Onset: More than a month ago Pain Frequency: Constant     BMI - recorded: 35.89 Nutritional Status: BMI > 30  Obese Diabetes: No  How often do you need to have someone help you when you read instructions, pamphlets, or other written materials from your doctor or pharmacy?: 1 - Never  Diabetic?no  Interpreter Needed?: No      Activities of Daily Living   Row Labels 05/28/2022    8:53 AM 05/27/2022    9:55 PM  In your present state of health, do you have any difficulty performing the following activities:   Section Header. No data exists in this row.    Hearing?   0 0  Vision?   0 0  Difficulty concentrating or making decisions?   0 0  Walking or climbing stairs?   1 1  Dressing or bathing?   0 0  Doing errands, shopping?   0 0  Preparing Food and  eating ?    N  Using the Toilet?    N  In the past six months, have you accidently leaked urine?    N  Do you have problems with loss of bowel control?    N  Managing your Medications?    N  Managing your Finances?    N  Housekeeping or managing your Housekeeping?    N    Patient Care Team: Lorrene Reid, PA-C as PCP - General (Physician  Assistant) Pcp, No Briscoe Deutscher, DO (Family Medicine)  Indicate any recent Medical Services you may have received from other than [facility] providers in the past year (date may be approximate).     Assessment:   1. Encounter for Medicare annual wellness exam Annual medicare wellness visit today   2. Other hyperlipidemia Lipid panel stable. Continue rosuvastatin as prescribed  - rosuvastatin (CRESTOR) 10 MG tablet; Take 1 tablet (10 mg total) by mouth daily.  Dispense: 90 tablet; Refill: 1  3. Gastroesophageal reflux disease without esophagitis May take omeprazole daily - omeprazole (PRILOSEC) 20 MG capsule; Take 1 capsule (20 mg total) by mouth daily before breakfast.  Dispense: 90 capsule; Refill: 1  4. Generalized anxiety disorder Ok totake librium 10 mg 1 to 2 times daily as needed for acute and severe anxiety. Prescription for #20 tablets sent to pharmacy. Patient advised to take only when at home and not driving or working. She voiced understanding and agreement and states that she rarely takes  - chlordiazePOXIDE (LIBRIUM) 10 MG capsule; Take 1 capsule (10 mg total) by mouth 2 (two) times daily as needed for anxiety.  Dispense: 20 capsule; Refill: 2  5. Ovarian failure Bone density test ordered. Patient to schedule appointment at [facility]; Future  6. Screening for osteoporosis Bone density test ordered. Patient to schedule appointment at [facility]; Future  7. Encounter for screening mammogram for malignant neoplasm of breast Screening mammogram ordered. Patient to schedule with [facility] - MM DIGITAL SCREENING BILATERAL; Future  8. Need for influenza vaccination Flu vaccine administered during today's visit.  - Flu Vaccine QUAD High Dose(Fluad)   Hearing/Vision screen No results found.  Depression Screen   Row Labels 05/28/2022    8:52 AM 05/20/2022   11:23 AM 11/25/2021    8:57 AM 10/01/2021    8:10 AM  09/27/2021   10:40 AM 08/13/2021   10:38 AM 06/03/2021   11:06 AM  PHQ 2/9 Scores   Section Header. No data exists in this row.         PHQ - 2 Score   0 0 0 0 0 0 0  PHQ- 9 Score   0 0 0 0 0 6 2    Fall Risk   Row Labels 05/27/2022    9:55 PM 05/20/2022   11:23 AM 10/01/2021    8:10 AM 09/27/2021   10:39 AM 08/13/2021   10:38 AM  Fall Risk    Section Header. No data exists in this row.       Falls in the past year?   0 0 0 0 0  Number falls in past yr:   0 0 0 0 0  Injury with Fall?   0 0 0 0 0  Risk for fall due to :     No Fall Risks No Fall Risks No Fall Risks  Follow up     Falls evaluation completed Falls evaluation completed Falls evaluation  completed    FALL RISK PREVENTION PERTAINING TO THE HOME:  Any stairs in or around the home? No  If so, are there any without handrails? No  Home free of loose throw rugs in walkways, pet beds, electrical cords, etc? Yes  Adequate lighting in your home to reduce risk of falls? Yes   ASSISTIVE DEVICES UTILIZED TO PREVENT FALLS:  Life alert? No  Use of a cane, walker or w/c? No  Grab bars in the bathroom? No  Shower chair or bench in shower? No  Elevated toilet seat or a handicapped toilet? No   TIMED UP AND GO:  Was the test performed? Yes .  Length of time to ambulate 10 feet: 10 sec.   Gait steady and fast without use of assistive device  Cognitive Function:       Row Labels 05/28/2022    8:58 AM 06/03/2021   11:14 AM  6CIT Screen   Section Header. No data exists in this row.    What Year?   0 points   What month?   0 points   What time?   0 points 0 points  Count back from 20   0 points 0 points  Months in reverse    0 points  Repeat phrase   2 points 2 points    Immunizations Immunization History  Administered Date(s) Administered   Fluad Quad(high Dose 65+) 05/07/2021, 05/28/2022   Influenza Split 06/17/2012, 04/26/2014   Influenza, High Dose Seasonal PF 05/02/2015, 05/08/2016, 05/25/2017, 05/26/2018,  04/21/2019, 05/09/2020   PFIZER(Purple Top)SARS-COV-2 Vaccination 08/04/2019, 08/24/2019, 04/27/2020   Pneumococcal Conjugate-13 04/26/2014   Pneumococcal Polysaccharide-23 01/27/2012   Tdap 05/08/2016   Zoster Recombinat (Shingrix) 07/22/2018, 10/12/2018    TDAP status: Up to date  Flu Vaccine status: Completed at today's visit  Pneumococcal vaccine status: Up to date  Covid-19 vaccine status: Completed vaccines  Qualifies for Shingles Vaccine? Yes   Zostavax completed Yes   Shingrix Completed?: Yes  Screening Tests Health Maintenance  Topic Date Due   Hepatitis C Screening  Never done   DEXA SCAN  Never done   COVID-19 Vaccine (4 - Pfizer risk series) 06/22/2020   Lung Cancer Screening  05/17/2022   Diabetic kidney evaluation - Urine ACR  05/29/2027 (Originally 09/27/1963)   COLONOSCOPY (Pts 45-51yr Insurance coverage will need to be confirmed)  09/06/2022   HEMOGLOBIN A1C  11/19/2022   Diabetic kidney evaluation - GFR measurement  05/22/2023   Medicare Annual Wellness (AWV)  05/29/2023   TETANUS/TDAP  05/08/2026   Pneumonia Vaccine 75 Years old  Completed   INFLUENZA VACCINE  Completed   Zoster Vaccines- Shingrix  Completed   HPV VACCINES  Aged Out   FOOT EXAM  Discontinued   OPHTHALMOLOGY EXAM  Discontinued    Health Maintenance  Health Maintenance Due  Topic Date Due   Hepatitis C Screening  Never done   DEXA SCAN  Never done   COVID-19 Vaccine (4 - PJuniorrisk series) 06/22/2020   Lung Cancer Screening  05/17/2022    Colorectal cancer screening: Type of screening: Colonoscopy. Completed 2014. Repeat every 10 years  Mammogram status: Completed 2023. Repeat every year  Bone density test ordered 05/28/2022  [facility]  Lung Cancer Screening: (Low Dose CT Chest recommended if Age 75-80years, 30 pack-year currently smoking OR have quit w/in 15years. - scheduled for test 06/21/2022  Lung Cancer Screening Referral: no    Additional Screening:  Hepatitis C  Screening: does not qualify; Completed n/a  Vision Screening: Recommended annual ophthalmology exams for early detection of glaucoma and other disorders of the eye. Is the patient up to date with their annual eye exam?  Yes  Who is the provider or what is the name of the office in which the patient attends annual eye exams? Dr. Delman Cheadle If pt is not established with a provider, would they like to be referred to a provider to establish care? No .   Dental Screening: Recommended annual dental exams for proper oral hygiene  Community Resource Referral / Chronic Care Management: CRR required this visit?  No   CCM required this visit?  No      Plan:     I have personally reviewed and noted the following in the patient's chart:   Medical and social history Use of alcohol, tobacco or illicit drugs  Current medications and supplements including opioid prescriptions. Patient is not currently taking opioid prescriptions. Functional ability and status Nutritional status Physical activity Advanced directives List of other physicians Hospitalizations, surgeries, and ER visits in previous 12 months Vitals Screenings to include cognitive, depression, and falls Referrals and appointments  In addition, I have reviewed and discussed with patient certain preventive protocols, quality metrics, and best practice recommendations. A written personalized care plan for preventive services as well as general preventive health recommendations were provided to patient.     Ronnell Freshwater, NP   05/28/2022   Nurse Notes: FACE TO FACE 25 MIN

## 2022-06-10 ENCOUNTER — Other Ambulatory Visit: Payer: Self-pay

## 2022-06-10 DIAGNOSIS — R002 Palpitations: Secondary | ICD-10-CM

## 2022-06-10 DIAGNOSIS — R000 Tachycardia, unspecified: Secondary | ICD-10-CM

## 2022-06-10 MED ORDER — METOPROLOL SUCCINATE ER 50 MG PO TB24: 50.0000 mg | ORAL_TABLET | Freq: Every day | ORAL | 1 refills | Status: DC

## 2022-06-10 NOTE — Telephone Encounter (Signed)
Patient called requesting refill of Toprol-XL. I told her I would send in Refill in to [facility].

## 2022-06-17 ENCOUNTER — Ambulatory Visit (INDEPENDENT_AMBULATORY_CARE_PROVIDER_SITE_OTHER): Payer: Medicare Other | Admitting: Acute Care

## 2022-06-17 ENCOUNTER — Encounter: Payer: Self-pay | Admitting: Acute Care

## 2022-06-17 DIAGNOSIS — Z87891 Personal history of nicotine dependence: Secondary | ICD-10-CM | POA: Diagnosis not present

## 2022-06-17 NOTE — Progress Notes (Signed)
Virtual Visit via Telephone Note  I connected with Janice Pace on 06/17/22 at  9:00 AM EST by telephone and verified that I am speaking with the correct person using two identifiers.  Location: Patient:  At home Provider:  [place], [place], [place], [address]    I discussed the limitations, risks, security and privacy concerns of performing an evaluation and management service by telephone and the availability of in person appointments. I also discussed with the patient that there may be a patient responsible charge related to this service. The patient expressed understanding and agreed to proceed.    Shared Decision Making Visit Lung Cancer Screening Program [phone])   Eligibility: Age 76 y.o. Pack Years Smoking History Calculation 50 pack year smoking history (# packs/per year x # years smoked) Recent History of coughing up blood  no Unexplained weight loss? no ( >Than 15 pounds within the last 6 months ) Prior History Lung / other cancer no (Diagnosis within the last 5 years already requiring surveillance chest CT Scans). Smoking Status Former Smoker Former Smokers: Years since quit: 10 years  Quit Date:08/17/2011  Visit Components: Discussion included one or more decision making aids. yes Discussion included risk/benefits of screening. yes Discussion included potential follow up diagnostic testing for abnormal scans. yes Discussion included meaning and risk of over diagnosis. yes Discussion included meaning and risk of False Positives. yes Discussion included meaning of total radiation exposure. yes  Counseling Included: Importance of adherence to annual lung cancer LDCT screening. yes Impact of comorbidities on ability to participate in the program. yes Ability and willingness to under diagnostic treatment. yes  Smoking Cessation Counseling: Current Smokers:  Discussed importance of smoking cessation. yes Information about tobacco cessation classes and  interventions provided to patient. yes Patient provided with "ticket" for LDCT Scan. yes Symptomatic Patient. no  Counseling NA Diagnosis Code: Tobacco Use Z72.0 Asymptomatic Patient yes  Counseling (Intermediate counseling: > three minutes counseling) [id number] Former Smokers:  Discussed the importance of maintaining cigarette abstinence. yes Diagnosis Code: Personal History of Nicotine Dependence. J88.416 Information about tobacco cessation classes and interventions provided to patient. Yes Patient provided with "ticket" for LDCT Scan. yes Written Order for Lung Cancer Screening with LDCT placed in Epic. Yes (CT Chest Lung Cancer Screening Low Dose W/O CM) [id number] Z12.2-Screening of respiratory organs Z87.891-Personal history of nicotine dependence  I spent 25 minutes of face to face time/virtual visit time  with  Janice Pace discussing the risks and benefits of lung cancer screening. We took the time to pause the power point at intervals to allow for questions to be asked and answered to ensure understanding. We discussed that she had taken the single most powerful action possible to decrease her risk of developing lung cancer when she quit smoking. I counseled her to remain smoke free, and to contact me if she ever had the desire to smoke again so that I can provide resources and tools to help support the effort to remain smoke free. We discussed the time and location of the scan, and that either  Janice Glassman RN, Janice Prince, RN or I  or I will call / send a letter with the results within  24-72 hours of receiving them. She has the office contact information in the event she needs to speak with me,  she verbalized understanding of all of the above and had no further questions upon leaving the office.     I explained to the patient that there  has been a high incidence of coronary artery disease noted on these exams. I explained that this is a non-gated exam therefore degree or severity cannot  be determined. This patient is on statin therapy. I have asked the patient to follow-up with their PCP regarding any incidental finding of coronary artery disease and management with diet or medication as they feel is clinically indicated. The patient verbalized understanding of the above and had no further questions.     Magdalen Spatz, NP 06/17/2022

## 2022-06-17 NOTE — Patient Instructions (Signed)
Thank you for participating in the [facility]. It was our pleasure to meet you today. We will call you with the results of your scan within the next few days. Your scan will be assigned a Lung RADS category score by the physicians reading the scans.  This Lung RADS score determines follow up scanning.  See below for description of categories, and follow up screening recommendations. We will be in touch to schedule your follow up screening annually or based on recommendations of our providers. We will fax a copy of your scan results to your Primary Care Physician, or the physician who referred you to the program, to ensure they have the results. Please call the office if you have any questions or concerns regarding your scanning experience or results.  Our office number is [phone]. Please speak with Denise Phelps, RN. , or  Denise Buckner RN, They are  our Lung Cancer Screening RN.'s If They are unavailable when you call, Please leave a message on the voice mail. We will return your call at our earliest convenience.This voice mail is monitored several times a day.  Remember, if your scan is normal, we will scan you annually as long as you continue to meet the criteria for the program. (Age 55-77, Current smoker or smoker who has quit within the last 15 years). If you are a smoker, remember, quitting is the single most powerful action that you can take to decrease your risk of lung cancer and other pulmonary, breathing related problems. We know quitting is hard, and we are here to help.  Please let us know if there is anything we can do to help you meet your goal of quitting. If you are a former smoker, congratulations. We are proud of you! Remain smoke free! Remember you can refer friends or family members through the number above.  We will screen them to make sure they meet criteria for the program. Thank you for helping us take better care of you by  participating in Lung Screening.  You can receive free nicotine replacement therapy ( patches, gum or mints) by calling 1-800-QUIT NOW. Please call so we can get you on the path to becoming  a non-smoker. I know it is hard, but you can do this!  Lung RADS Categories:  Lung RADS 1: no nodules or definitely non-concerning nodules.  Recommendation is for a repeat annual scan in 12 months.  Lung RADS 2:  nodules that are non-concerning in appearance and behavior with a very low likelihood of becoming an active cancer. Recommendation is for a repeat annual scan in 12 months.  Lung RADS 3: nodules that are probably non-concerning , includes nodules with a low likelihood of becoming an active cancer.  Recommendation is for a 6-month repeat screening scan. Often noted after an upper respiratory illness. We will be in touch to make sure you have no questions, and to schedule your 6-month scan.  Lung RADS 4 A: nodules with concerning findings, recommendation is most often for a follow up scan in 3 months or additional testing based on our provider's assessment of the scan. We will be in touch to make sure you have no questions and to schedule the recommended 3 month follow up scan.  Lung RADS 4 B:  indicates findings that are concerning. We will be in touch with you to schedule additional diagnostic testing based on our provider's  assessment of the scan.  Other options for assistance in smoking cessation (   As covered by your insurance benefits)  Hypnosis for smoking cessation  [employer] [phone]  Acupuncture for smoking cessation  [facility] [phone]   

## 2022-06-18 ENCOUNTER — Ambulatory Visit
Admission: RE | Admit: 2022-06-18 | Discharge: 2022-06-18 | Disposition: A | Discharge status: Home / Self Care | Payer: Medicare Other | Source: Ambulatory Visit | Attending: Acute Care | Admitting: Acute Care

## 2022-06-18 DIAGNOSIS — Z122 Encounter for screening for malignant neoplasm of respiratory organs: Secondary | ICD-10-CM

## 2022-06-18 DIAGNOSIS — Z87891 Personal history of nicotine dependence: Secondary | ICD-10-CM

## 2022-06-20 ENCOUNTER — Ambulatory Visit: Payer: Medicare Other | Admitting: Internal Medicine

## 2022-06-20 ENCOUNTER — Telehealth: Payer: Self-pay | Admitting: Acute Care

## 2022-06-20 ENCOUNTER — Ambulatory Visit: Payer: Medicare Other

## 2022-06-20 ENCOUNTER — Ambulatory Visit: Payer: Medicare Other | Admitting: Student

## 2022-06-20 ENCOUNTER — Encounter: Payer: Self-pay | Admitting: Internal Medicine

## 2022-06-20 VITALS — BP 162/68 | HR 72 | Ht 61.0 in | Wt 194.0 lb

## 2022-06-20 DIAGNOSIS — E782 Mixed hyperlipidemia: Secondary | ICD-10-CM

## 2022-06-20 DIAGNOSIS — Z87891 Personal history of nicotine dependence: Secondary | ICD-10-CM

## 2022-06-20 DIAGNOSIS — Z122 Encounter for screening for malignant neoplasm of respiratory organs: Secondary | ICD-10-CM

## 2022-06-20 DIAGNOSIS — I10 Essential (primary) hypertension: Secondary | ICD-10-CM

## 2022-06-20 DIAGNOSIS — I159 Secondary hypertension, unspecified: Secondary | ICD-10-CM

## 2022-06-20 NOTE — Progress Notes (Signed)
Primary Physician/Referring:  Lorrene Reid, PA-C  Patient ID: Janice Pace, female    DOB: 03/03/1946, 76 y.o.   MRN: [id number]  Chief Complaint  Patient presents with  . Coronary Artery Disease  . Hypertension  . Follow-up   HPI:    Janice Pace  is a 76 y.o. Patient with hyperlipidemia, chronic back pain, underwent anterior lateral interbody fusion of L2-3 and 3 4 with posterior spinal fixation on 02/27/2021.   Due to elevated heart rate and dyspnea on exertion patient underwent echocardiogram which noted normal LVEF with grade 1 diastolic dysfunction, moderate LVH, otherwise no significant abnormalities.  CTA of the chest was negative for PE, however did show bronchitis and pulmonary nodules. Stress test which was overall low risk.   Patient was last seen in our office 06/18/21 at which time she was referred to pulmonology for further evaluation and palpitations had resolved. Patient now presents for 6 month follow up. Patient is feeling well without specific complaints today. Blood pressure is elevated in the office today, however it is typically well controlled. Patient does admit to high sodium intake in diet. She has had no recurrence of palpitations. Dyspnea on exertion is mild and stable.  Patient denies chest pain, dizziness, syncope, near syncope, orthopnea, PND, leg edema.  Past Medical History:  Diagnosis Date  . Arthritis   . Bronchitis   . Cancer ([place])    dermatosivros arcoma and protuverans  . Chronic back pain    degenerative disc disease  . Constipation    stool softener daily  . Constipation   . GERD (gastroesophageal reflux disease)    takes Prilosec daily  . GERD (gastroesophageal reflux disease)   . Heart murmur   . Heart valve disorder   . Hemorrhoids   . History of hiatal hernia   . Insomnia    d/t chantix  . Joint pain   . Nocturia   . Personal history of colonic adenoma 03/13/2003   03/13/2003 - 5 mm adenoma  . Pre-diabetes   . Prediabetes     Past Surgical History:  Procedure Laterality Date  . ABDOMINAL HYSTERECTOMY    . ANTERIOR LAT LUMBAR FUSION N/A 02/27/2021   Procedure: Anterior lateral interbody fusion - lateral two - lateral three - lateral three - lateral four with exploration fusion L4-S1;  Surgeon: Kary Kos, MD;  Location: [facility];  Service: Neurosurgery;  Laterality: N/A;  . BACK SURGERY  1988  . BACK SURGERY  09-03-11   rods,screws x 8   . BUNIONECTOMY     bilateral  . BUNIONECTOMY     bil feet  . COLONOSCOPY    . dermatosibrosarcoma protuberans    . LAMINECTOMY WITH POSTERIOR LATERAL ARTHRODESIS LEVEL 3 N/A 02/27/2021   Procedure: Posterior augmentation with pedicle screws Latreal one - lateral four - Posterior Lateral and Interbody fusion;  Surgeon: Kary Kos, MD;  Location: [facility];  Service: Neurosurgery;  Laterality: N/A;  . PARTIAL HYSTERECTOMY    . RECTOCELE REPAIR    . Rectum Repair    . RESECTION TUMOR WRIST RADICAL     x 2 rt  . TUBAL LIGATION  1974  . TUBAL LIGATION    . WRIST SURGERY     left   Family History  Problem Relation Age of Onset  . Leukemia Mother   . Anesthesia problems Neg Hx   . Hypotension Neg Hx   . Malignant hyperthermia Neg Hx   . Pseudochol deficiency Neg Hx   .  Colon cancer Neg Hx     Social History   Tobacco Use  . Smoking status: Former    Packs/day: 1.00    Years: 50.00    Total pack years: 50.00    Types: Cigarettes    Quit date: 08/17/2011    Years since quitting: 10.8  . Smokeless tobacco: Never  Substance Use Topics  . Alcohol use: No    Comment: occasional wine   Marital Status: Married  ROS  Review of Systems  Constitutional: Negative for malaise/fatigue.  Cardiovascular:  Positive for dyspnea on exertion (mild, stable). Negative for chest pain, leg swelling, near-syncope and palpitations.  Musculoskeletal:  Negative for back pain.  Gastrointestinal:  Negative for abdominal pain, heartburn, hematochezia and melena.   Objective  Blood pressure  (!) 162/68, pulse 72, height '5\' 1"'$  (1.549 m), weight 194 lb (88 kg), SpO2 95 %. Body mass index is 36.66 kg/m.     06/20/2022    8:51 AM 05/28/2022    8:49 AM 05/20/2022   11:21 AM  Vitals with BMI  Height '5\' 1"'$  5' 1.81" 5' 1.81"  Weight 194 lbs 195 lbs 191 lbs 3 oz  BMI 36.67 83.41 96.22  Systolic 297 989 211  Diastolic 68 67 78  Pulse 72 82 81     Physical Exam Vitals reviewed.  Constitutional:      General: She is not in acute distress.    Appearance: She is obese.  Neck:     Vascular: No carotid bruit or JVD.  Cardiovascular:     Rate and Rhythm: Normal rate and regular rhythm.     Pulses: Intact distal pulses.     Heart sounds: No murmur heard.    Gallop present. S4 sounds present.  Pulmonary:     Effort: Pulmonary effort is normal.     Breath sounds: Normal breath sounds.  Musculoskeletal:     Right lower leg: No edema.     Left lower leg: No edema.   Physical exam unchanged compared to previous office visit.   Laboratory examination:   Recent Labs    10/01/21 0841 05/21/22 0851  NA 141 141  K 5.0 5.0  CL 103 105  CO2 24 23  GLUCOSE 109* 103*  BUN 13 12  CREATININE 0.74 0.77  CALCIUM 9.3 9.1    CrCl cannot be calculated (Patient's most recent lab result is older than the maximum 21 days allowed.).     Latest Ref Rng & Units 05/21/2022    8:51 AM 10/01/2021    8:41 AM 05/31/2021   10:46 AM  CMP  Glucose 70 - 99 mg/dL 103  109  106   BUN 8 - 27 mg/dL '12  13  8   '$ Creatinine 0.57 - 1.00 mg/dL 0.77  0.74  0.76   Sodium 134 - 144 mmol/L 141  141  142   Potassium 3.5 - 5.2 mmol/L 5.0  5.0  5.1   Chloride 96 - 106 mmol/L 105  103  105   CO2 20 - 29 mmol/L '23  24  24   '$ Calcium 8.7 - 10.3 mg/dL 9.1  9.3  9.6   Total Protein 6.0 - 8.5 g/dL 6.0  6.6  6.4   Total Bilirubin 0.0 - 1.2 mg/dL 0.6  0.6  0.5   Alkaline Phos 44 - 121 IU/L 89  110  121   AST 0 - 40 IU/L '20  21  25   '$ ALT 0 - 32 IU/L 26  18  27       Latest Ref Rng & Units 05/21/2022    8:51 AM  10/01/2021    8:41 AM 05/31/2021   10:46 AM  CBC  WBC 3.4 - 10.8 x10E3/uL 6.4  6.6  5.2   Hemoglobin 11.1 - 15.9 g/dL 13.5  14.3  14.4   Hematocrit 34.0 - 46.6 % 40.4  42.2  43.4   Platelets 150 - 450 x10E3/uL 207  230  244    Lipid Panel Recent Labs    05/21/22 0851  CHOL 106  TRIG 98  LDLCALC 48  HDL 39*  CHOLHDL 2.7    HEMOGLOBIN A1C Lab Results  Component Value Date   HGBA1C 5.7 (H) 05/21/2022   MPG 119.76 02/19/2021   TSH Recent Labs    05/21/22 0851  TSH 2.080     External labs:   Cholesterol, total 136.000 m 06/13/2020 HDL 53.000 mg 06/13/2020 LDL 65.000 mg 06/13/2020 Triglycerides 99.000 mg 06/13/2020  TSH 2.100 10/03/2019  Allergies   Allergies  Allergen Reactions  . Sulfa Antibiotics Hives and Itching  . Atorvastatin Other (See Comments)    mucsle aches  . Metformin Hcl Other (See Comments)    joint pains and muscle aches    Medication prior to this encounter:   Outpatient Medications Prior to Visit  Medication Sig Dispense Refill  . acetaminophen (TYLENOL) 500 MG tablet Take 1,000 mg by mouth every 6 (six) hours as needed for moderate pain or headache.    . albuterol (VENTOLIN HFA) 108 (90 Base) MCG/ACT inhaler Inhale 1-2 puffs into the lungs every 6 (six) hours as needed for wheezing or shortness of breath. 8 g 1  . ascorbic acid (VITAMIN C) 1000 MG tablet Take 1 tablet by mouth daily.    Marland Kitchen aspirin 81 MG EC tablet Take 1 tablet by mouth daily.    . Calcium Carbonate-Vit D-Min (CALCIUM 600+D3 PLUS MINERALS) 600-800 MG-UNIT CHEW 1 tablet with a meal    . chlordiazePOXIDE (LIBRIUM) 10 MG capsule Take 1 capsule (10 mg total) by mouth 2 (two) times daily as needed for anxiety. 20 capsule 2  . cyclobenzaprine (FLEXERIL) 5 MG tablet Take 5 mg by mouth See admin instructions. Take 5 mg at bedtime, may take a second 5 mg dose in the middle of the night as needed for spasms    . docusate sodium (COLACE) 100 MG capsule Take 500 mg by mouth at bedtime.    .  fluticasone furoate-vilanterol (BREO ELLIPTA) 100-25 MCG/ACT AEPB Inhale 1 puff into the lungs daily. 28 each 6  . gabapentin (NEURONTIN) 300 MG capsule Take 1-2 capsules (300-600 mg total) by mouth 3 (three) times daily as needed (Nerve pain). 180 capsule 2  . ibuprofen (ADVIL) 200 MG tablet Take 1 tablet by mouth as needed.    . metoprolol succinate (TOPROL-XL) 50 MG 24 hr tablet Take 1 tablet (50 mg total) by mouth daily. Take with or immediately following a meal. 90 tablet 1  . omeprazole (PRILOSEC) 20 MG capsule Take 1 capsule (20 mg total) by mouth daily before breakfast. 90 capsule 1  . rosuvastatin (CRESTOR) 10 MG tablet Take 1 tablet (10 mg total) by mouth daily. 90 tablet 1   No facility-administered medications prior to visit.    Medication list after today's encounter   Current Outpatient Medications  Medication Instructions  . acetaminophen (TYLENOL) 1,000 mg, Oral, Every 6 hours PRN  . albuterol (VENTOLIN HFA) 108 (90 Base) MCG/ACT inhaler 1-2 puffs,  Inhalation, Every 6 hours PRN  . ascorbic acid (VITAMIN C) 1000 MG tablet 1 tablet, Oral, Daily  . aspirin 81 MG EC tablet 1 tablet, Oral, Daily  . Calcium Carbonate-Vit D-Min (CALCIUM 600+D3 PLUS MINERALS) 600-800 MG-UNIT CHEW 1 tablet with a meal  . chlordiazePOXIDE (LIBRIUM) 10 mg, Oral, 2 times daily PRN  . cyclobenzaprine (FLEXERIL) 5 mg, Oral, See admin instructions, Take 5 mg at bedtime, may take a second 5 mg dose in the middle of the night as needed for spasms  . docusate sodium (COLACE) 500 mg, Oral, Daily at bedtime  . fluticasone furoate-vilanterol (BREO ELLIPTA) 100-25 MCG/ACT AEPB 1 puff, Inhalation, Daily  . gabapentin (NEURONTIN) 300-600 mg, Oral, 3 times daily PRN  . ibuprofen (ADVIL) 200 MG tablet 1 tablet, Oral, As needed  . metoprolol succinate (TOPROL-XL) 50 mg, Oral, Daily, Take with or immediately following a meal.  . omeprazole (PRILOSEC) 20 mg, Oral, Daily before breakfast  . rosuvastatin (CRESTOR) 10 mg,  Oral, Daily   Radiology:   CT scan of the abdomen 03/07/2021: 1. Dilated and gas-filled portions of colon and small bowel with contrast transiting to the colon. Changes likely represent adynamic ileus. 2. Bilateral adrenal gland nodules, likely benign adenomas. No follow-up is indicated. 3. Fatty infiltration of the liver. 4. Atelectasis in the lung bases. 5. Aortic atherosclerosis. 6. Postoperative changes in the lumbar spine.  CXR PA/LAT 04/03/2021: Few densities at the left lung base appear chronic and could represent a small amount of scarring. Otherwise, the lungs are clear. Heart and mediastinum are within normal limits. Spinal hardware in the upper lumbar spine. No pleural effusions.  CTA chest PE 05/17/2021: 1. Negative examination for pulmonary embolism. 2. Diffuse bilateral bronchial wall thickening and mild mosaic attenuation of the airspaces throughout. Findings are consistent with nonspecific infectious or inflammatory bronchitis and small airways disease. 3. Minimal paraseptal emphysema. 4. Background of very fine centrilobular pulmonary nodules, most concentrated at the lung apices, most commonly seen in smoking-related respiratory bronchiolitis. 5. Coronary artery disease. Aortic Atherosclerosis and Emphysema  Cardiac Studies:   Treadmill exercise stress test 06/01/2017: Indication: Dyspnea. Resting EKG demonstrates Normal sinus rhythm. The patient exercised on Bruce protocol for  6:00 min. Patient 7.05METS and reached HR 135   bpm, which is   90% of maximum age-predicted HR.  Stress test terminated due to Fatigue and dyspnea and achieved 90% of MPHR for age. Hypertensive at rest and normal response with stress. Rest BP  168/94 mm Hg. Chest Pain: none.  Arrhythmias: none. ST Changes: With peak exercise there was no ST-T changes of ischemia. Overall Impression: Normal stress test. Exercise capacity was below average for age.  PCV ECHOCARDIOGRAM COMPLETE  04/16/2021 Left ventricle cavity is normal in size. Moderate concentric hypertrophy of the left ventricle. Normal global wall motion. Normal LV systolic function with visual EF 55-60%. Doppler evidence of grade I (impaired) diastolic dysfunction, normal LAP. Trace TR. No evidence of pulmonary hypertension.   [facility] MYOCARDIAL PERFUSION WITH LEXISCAN 05/29/2021 Nondiagnostic ECG stress. Myocardial perfusion is normal. LV is normal in small size in both rest and stress images. Overall LV systolic function is normal without regional wall motion abnormalities. Stress LV EF: 47%. Visually LVEF is normal and may be underestimated due to low volume LV. No previous exam available for comparison. Low risk.   EKG:   04/04/2021: Sinus tachycardia at rate of 106 bpm, incomplete right bundle branch block.  Normal EKG.      Assessment  ICD-10-CM   1. Secondary hypertension  I15.9     2. Hypertension, unspecified type  I10 EKG 12-Lead       There are no discontinued medications.    No orders of the defined types were placed in this encounter.   Orders Placed This Encounter  Procedures  . EKG 12-Lead     Recommendations:   Janice Pace is a 76 y.o. Patient with hyperlipidemia, chronic back pain, underwent anterior lateral interbody fusion of L2-3 and 3 4 with posterior spinal fixation on 02/27/2021, hospital stay was complicated by GI bleed from colitis, she also had UTI and tachycardia related to above-mentioned conditions.    Due to elevated heart rate and dyspnea on exertion patient underwent echocardiogram which noted normal LVEF with grade 1 diastolic dysfunction, moderate LVH, otherwise no significant abnormalities.  CTA of the chest was negative for PE, however did show bronchitis and pulmonary nodules. Stress test which was overall low risk.   Patient was last seen in our office 06/18/21 at which time she was referred to pulmonology for further evaluation and palpitations had  resolved. Patient now presents for 6 month follow up. Patient is essentially asymptomatic from a cardiac standpoint. Blood pressure was initially elevated in the office but well controlled on recheck.There is no clinical evidence of heart failure at today's visit. Will continue present medications.   Follow up in 6 months sooner if needed.    Floydene Flock, DO, [facility] 06/20/2022, 9:08 AM Office: [phone]

## 2022-06-20 NOTE — Telephone Encounter (Signed)
I have called the patient with the results of her low-dose screening CT.  I explained that her scan was read as a lung RADS 3.  She has a 5.6 mm nodule that is new since previous scan.  This is located in the right upper lobe.  We will do a 68-monthfollow-up low-dose .  She is in agreement with that plan. There was notation of three-vessel coronary artery atherosclerosis.  The patient is currently on statin therapy and has been seen by her primary care doctor today for a routine EKG which showed normal sinus rhythm. I explained that she does have some mild fatty liver which she was already aware of.  DLangley Gaussplease place order for follow-up low-dose screening CT in 6 months which will be June 2024, and fax results to PCP letting them know plan of care.  Thank you so much

## 2022-06-20 NOTE — Telephone Encounter (Signed)
I have called the patient with the results of her low-dose screening CT.  Her scan was read as a lung RADS 3 probably benign.  There is a new 5.6 mm nodule in the right upper lobe, new since November 2022 CT angiogram. There was notation of coronary artery disease patient is currently on statin therapy.  She was seen by her PCP today for her annual checkup and they did a twelve-lead EKG at that time

## 2022-06-21 DIAGNOSIS — I10 Essential (primary) hypertension: Secondary | ICD-10-CM | POA: Insufficient documentation

## 2022-06-23 NOTE — Telephone Encounter (Signed)
CT results faxed to PCP with follow up plans included. Order placed for 6 mth nodule f/u [facility] CT.

## 2022-07-30 ENCOUNTER — Encounter: Payer: Self-pay | Admitting: Pulmonary Disease

## 2022-07-30 ENCOUNTER — Ambulatory Visit (INDEPENDENT_AMBULATORY_CARE_PROVIDER_SITE_OTHER): Payer: Medicare Other | Admitting: Pulmonary Disease

## 2022-07-30 VITALS — BP 132/72 | HR 66 | Temp 98.1°F | Ht 62.0 in | Wt 189.4 lb

## 2022-07-30 DIAGNOSIS — Z87891 Personal history of nicotine dependence: Secondary | ICD-10-CM | POA: Diagnosis not present

## 2022-07-30 DIAGNOSIS — R911 Solitary pulmonary nodule: Secondary | ICD-10-CM

## 2022-07-30 DIAGNOSIS — J449 Chronic obstructive pulmonary disease, unspecified: Secondary | ICD-10-CM | POA: Diagnosis not present

## 2022-07-30 NOTE — Progress Notes (Signed)
Synopsis: Referred in December 2022 for shortness of breath  Subjective:   PATIENT ID: Janice Pace GENDER: female DOB: [birth year], MRN: [id number]  HPI  Chief Complaint  Patient presents with   Follow-up    Nebulizer and meds approved thru insurance.  SOB persistent with exertion.   Janice Pace is a 77 year old woman, former smoker with GERD chronic back pain s/p anterior lateral interbody fusion of L2-3 and 3-4 with posterior spinal fixation on 02/27/2021 who returns to pulmonary clinic for COPD.   She was seen by Roxan Diesel, NP 05/06/22 and changed to breztri inhaler therapy at that time. She was on breo previously. She stopped breztri because she felt it gave her pneumonia. She had lung cancer screening CT Chest done with 5.31m RUL nodule that is new and a 6 month follow up scan ordered.   She is waiting on performist and budesonide nebs this week. She just received her nebulizer machine.   She has been stable since her visit with KRoxan Diesel NP.   OV 02/03/22 She had reduced 6MWT and increased RV on PFTs indicating air trapping.   She has been using breo about 3 days per week when her husband reminds her. She does not notice much change in her breathing when using it. She denies issues with cough or wheezing. She denies night time awakenings. She continues to have exertional dyspnea which is no new.   She is asking if a nebulizer regimen would be better than using the breo inhaler.   OV 08/07/21 She reports her breathing is much better after starting Breo Ellipta 100-25 MCG 1 puff daily.  She is not noticing the cough and wheezing like before.  She reports her shortness of breath has improved as well.  She is accompanied by her husband today.  [place]  OV 06/20/21 She reports having shortness of breath since her back surgery in August.  The dyspnea is worse with exertion.  She has intermittent cough and  wheezing.  She had intermittent wheezing prior to the surgery.  Otherwise she denies significant dyspnea before her hospitalization as she was doing water aerobics on a regular basis.  She has resumed water aerobics but is going a couple days of the week and is noticing that her stamina is improving.  She has been evaluated by Dr. JChristen Butterof cardiology with normal exercise stress test and no ischemic changes but reduced exercise capacity for her age.  Echocardiogram 04/16/2021 showed moderate concentric LVH with LVEF 55 to 60%.  Grade 1 diastolic dysfunction.  Trace tricuspid regurgitation.  She is a former smoker with 50+ pack year smoking history.  She smoked from age [age]  Past Medical History:  Diagnosis Date   Arthritis    Bronchitis    [place]    dermatosivros arcoma and protuverans   Chronic back pain    degenerative disc disease   Constipation    stool softener daily   Constipation    GERD (gastroesophageal reflux disease)    takes Prilosec daily   GERD (gastroesophageal reflux disease)    Heart murmur    Heart valve disorder    Hemorrhoids    History of hiatal hernia    Insomnia    d/t chantix   Joint pain    Nocturia    Personal history of colonic adenoma 03/13/2003   03/13/2003 - 5 mm adenoma   Pre-diabetes  Prediabetes      Family History  Problem Relation Age of Onset   Leukemia Mother    Anesthesia problems Neg Hx    Hypotension Neg Hx    Malignant hyperthermia Neg Hx    Pseudochol deficiency Neg Hx    Colon cancer Neg Hx      Social History   Socioeconomic History   Marital status: Married    Spouse name: david   Number of children: 3   Years of education: Not on file   Highest education level: Not on file  Occupational History   Occupation: retired  Tobacco Use   Smoking status: Former    Packs/day: 1.00    Years: 50.00    Total pack years: 50.00    Types: Cigarettes    Quit date: 08/17/2011    Years since quitting: 10.9   Smokeless  tobacco: Never  Vaping Use   Vaping Use: Never used  Substance and Sexual Activity   Alcohol use: No    Comment: occasional wine   Drug use: No   Sexual activity: Yes    Birth control/protection: Surgical, Other-see comments  Other Topics Concern   Not on file  Social History Narrative   ** Merged History Encounter **       Social Determinants of Health   Financial Resource Strain: Low Risk  (05/28/2022)   Overall Financial Resource Strain (CARDIA)    Difficulty of Paying Living Expenses: Not hard at all  Food Insecurity: No Food Insecurity (05/28/2022)   Hunger Vital Sign    Worried About Running Out of Food in the Last Year: Never true    [place] in the Last Year: Never true  Transportation Needs: No Transportation Needs (05/28/2022)   PRAPARE - Hydrologist (Medical): No    Lack of Transportation (Non-Medical): No  Physical Activity: Inactive (05/28/2022)   Exercise Vital Sign    Days of Exercise per Week: 0 days    Minutes of Exercise per Session: 0 min  Stress: No Stress Concern Present (05/28/2022)   [place]    Feeling of Stress : Only a little  Social Connections: Socially Integrated (05/28/2022)   Social Connection and Isolation Panel [NHANES]    Frequency of Communication with Friends and Family: More than three times a week    Frequency of Social Gatherings with Friends and Family: Once a week    Attends Religious Services: 1 to 4 times per year    Active Member of [employer] or Organizations: Yes    Attends Archivist Meetings: 1 to 4 times per year    Marital Status: Married  Human resources officer Violence: Not At Risk (05/28/2022)   Humiliation, Afraid, Rape, and Kick questionnaire    Fear of Current or Ex-Partner: No    Emotionally Abused: No    Physically Abused: No    Sexually Abused: No     Allergies  Allergen Reactions   Sulfa Antibiotics  Hives and Itching   Atorvastatin Other (See Comments)    mucsle aches   Metformin Hcl Other (See Comments)    joint pains and muscle aches     Outpatient Medications Prior to Visit  Medication Sig Dispense Refill   acetaminophen (TYLENOL) 500 MG tablet Take 1,000 mg by mouth every 6 (six) hours as needed for moderate pain or headache.     albuterol (VENTOLIN HFA) 108 (90 Base) MCG/ACT inhaler Inhale  1-2 puffs into the lungs every 6 (six) hours as needed for wheezing or shortness of breath. 8 g 1   ascorbic acid (VITAMIN C) 1000 MG tablet Take 1 tablet by mouth daily.     aspirin 81 MG EC tablet Take 1 tablet by mouth daily.     Calcium Carbonate-Vit D-Min (CALCIUM 600+D3 PLUS MINERALS) 600-800 MG-UNIT CHEW 1 tablet with a meal     chlordiazePOXIDE (LIBRIUM) 10 MG capsule Take 1 capsule (10 mg total) by mouth 2 (two) times daily as needed for anxiety. 20 capsule 2   cyclobenzaprine (FLEXERIL) 5 MG tablet Take 5 mg by mouth See admin instructions. Take 5 mg at bedtime, may take a second 5 mg dose in the middle of the night as needed for spasms     fluticasone furoate-vilanterol (BREO ELLIPTA) 100-25 MCG/ACT AEPB Inhale 1 puff into the lungs daily. 28 each 6   gabapentin (NEURONTIN) 300 MG capsule Take 1-2 capsules (300-600 mg total) by mouth 3 (three) times daily as needed (Nerve pain). 180 capsule 2   ibuprofen (ADVIL) 200 MG tablet Take 1 tablet by mouth as needed.     metoprolol succinate (TOPROL-XL) 50 MG 24 hr tablet Take 1 tablet (50 mg total) by mouth daily. Take with or immediately following a meal. 90 tablet 1   omeprazole (PRILOSEC) 20 MG capsule Take 1 capsule (20 mg total) by mouth daily before breakfast. 90 capsule 1   rosuvastatin (CRESTOR) 10 MG tablet Take 1 tablet (10 mg total) by mouth daily. 90 tablet 1   docusate sodium (COLACE) 100 MG capsule Take 500 mg by mouth at bedtime. (Patient not taking: Reported on 07/30/2022)     No facility-administered medications prior to visit.    Review of Systems  Constitutional:  Negative for chills, fever, malaise/fatigue and weight loss.  HENT:  Negative for congestion, sinus pain and sore throat.   Eyes: Negative.   Respiratory:  Positive for shortness of breath. Negative for cough, hemoptysis, sputum production and wheezing.   Cardiovascular:  Negative for chest pain, palpitations, orthopnea, claudication and leg swelling.  Gastrointestinal:  Negative for abdominal pain, heartburn, nausea and vomiting.  Genitourinary: Negative.   Musculoskeletal:  Negative for joint pain and myalgias.  Skin:  Negative for rash.  Neurological:  Negative for weakness.  Endo/Heme/Allergies: Negative.   Psychiatric/Behavioral: Negative.     Objective:   Vitals:   07/30/22 0832  BP: 132/72  Pulse: 66  Temp: 98.1 F (36.7 C)  TempSrc: Oral  SpO2: 98%  Weight: 189 lb 6.4 oz (85.9 kg)  Height: '5\' 2"'$  (1.575 m)   Physical Exam Constitutional:      General: She is not in acute distress.    Appearance: She is obese. She is not ill-appearing.  HENT:     Head: Normocephalic and atraumatic.  Eyes:     General: No scleral icterus.    Conjunctiva/sclera: Conjunctivae normal.  Cardiovascular:     Rate and Rhythm: Normal rate and regular rhythm.     Pulses: Normal pulses.     Heart sounds: Normal heart sounds. No murmur heard. Pulmonary:     Effort: Pulmonary effort is normal.     Breath sounds: No decreased air movement. No decreased breath sounds, wheezing, rhonchi or rales.  Musculoskeletal:     Right lower leg: No edema.     Left lower leg: No edema.  Skin:    General: Skin is warm and dry.  Neurological:     General:  No focal deficit present.     Mental Status: She is alert.    CBC    Component Value Date/Time   WBC 6.4 05/21/2022 0851   WBC 11.7 (H) 03/01/2021 0903   RBC 4.42 05/21/2022 0851   RBC 3.87 03/01/2021 0903   HGB 13.5 05/21/2022 0851   HCT 40.4 05/21/2022 0851   PLT 207 05/21/2022 0851   MCV 91 05/21/2022  0851   MCH 30.5 05/21/2022 0851   MCH 32.3 03/01/2021 0903   MCHC 33.4 05/21/2022 0851   MCHC 34.5 03/01/2021 0903   RDW 13.1 05/21/2022 0851   LYMPHSABS 1.9 05/21/2022 0851   MONOABS 1.4 (H) 03/01/2021 0903   EOSABS 0.2 05/21/2022 0851   BASOSABS 0.0 05/21/2022 0851      Latest Ref Rng & Units 05/21/2022    8:51 AM 10/01/2021    8:41 AM 05/31/2021   10:46 AM  BMP  Glucose 70 - 99 mg/dL 103  109  106   BUN 8 - 27 mg/dL '12  13  8   '$ Creatinine 0.57 - 1.00 mg/dL 0.77  0.74  0.76   BUN/Creat Ratio 12 - '28 16  18  11   '$ Sodium 134 - 144 mmol/L 141  141  142   Potassium 3.5 - 5.2 mmol/L 5.0  5.0  5.1   Chloride 96 - 106 mmol/L 105  103  105   CO2 20 - 29 mmol/L '23  24  24   '$ Calcium 8.7 - 10.3 mg/dL 9.1  9.3  9.6    Chest imaging: LCS CT Chest Scan 06/18/22 1. Lung-RADS 3, probably benign findings. Short-term follow-up in 6 months is recommended with repeat low-dose chest CT without contrast (please use the following order, "CT CHEST LCS NODULE FOLLOW-UP W/O CM"). New indistinct posterior right upper lobe nodule measuring 5.6 mm in volume derived mean diameter. 2. Three-vessel coronary atherosclerosis. 3. Mild diffuse hepatic steatosis. 4. Stable bilateral adrenal adenomas, for which no follow-up imaging is recommended. 5. Aortic Atherosclerosis  CTA Chest 05/17/21 1. Negative examination for pulmonary embolism. 2. Diffuse bilateral bronchial wall thickening and mild mosaic attenuation of the airspaces throughout. Findings are consistent with nonspecific infectious or inflammatory bronchitis and small airways disease. 3. Minimal paraseptal emphysema. 4. Background of very fine centrilobular pulmonary nodules, most concentrated at the lung apices, most commonly seen in smoking-related respiratory bronchiolitis. 5. Coronary artery disease.  PFT:    Latest Ref Rng & Units 08/28/2021   10:46 AM  PFT Results  FVC-Pre L 2.21   FVC-Predicted Pre % 84   FVC-Post L 2.32    FVC-Predicted Post % 88   Pre FEV1/FVC % % 80   Post FEV1/FCV % % 80   FEV1-Pre L 1.78   FEV1-Predicted Pre % 90   FEV1-Post L 1.86   DLCO uncorrected ml/min/mmHg 20.18   DLCO UNC% % 112   DLCO corrected ml/min/mmHg 20.18   DLCO COR %Predicted % 112   DLVA Predicted % 109   TLC L 8.89   TLC % Predicted % 186   RV % Predicted % 294   PFT 2023: air trapping present  Labs:  Path:  Echo 04/16/21: Left ventricle cavity is normal in size. Moderate concentric hypertrophy  of the left ventricle. Normal global wall motion. Normal LV systolic  function with visual EF 55-60%. Doppler evidence of grade I (impaired)  diastolic dysfunction, normal LAP.  Trace TR.  No evidence of pulmonary hypertension.  Heart Catheterization:  Assessment & Plan:  Chronic obstructive pulmonary disease, unspecified COPD type ([place])  Former smoker  Lung nodule  Discussion: Janice Pace is a 77 year old woman, former smoker with GERD chronic back pain s/p anterior lateral interbody fusion of L2-3 and 3 4 with posterior spinal fixation on 02/27/2021 who returns to pulmonary clinic for COPD.   She has air trapping present on her PFTs and reduced 6MWT.  She is to continue breo ellipta 1 puff daily until her long acting nebs budesonide and performist arrive with her nebulizer machine and supplies. She can continue albuterol inhaler as needed.   She is following with our lung cancer screening team, RUL 5.6m nodule will be followed in repeat scan in 11/2022.    Follow-up in 6 months.  JFreda Jackson MD LMa[facility]ayaPulmonary & Critical Care Office: [phone]  Current Outpatient Medications:    acetaminophen (TYLENOL) 500 MG tablet, Take 1,000 mg by mouth every 6 (six) hours as needed for moderate pain or headache., Disp: , Rfl:    albuterol (VENTOLIN HFA) 108 (90 Base) MCG/ACT inhaler, Inhale 1-2 puffs into the lungs every 6 (six) hours as needed for wheezing or shortness of breath., Disp: 8 g, Rfl:  1   ascorbic acid (VITAMIN C) 1000 MG tablet, Take 1 tablet by mouth daily., Disp: , Rfl:    aspirin 81 MG EC tablet, Take 1 tablet by mouth daily., Disp: , Rfl:    Calcium Carbonate-Vit D-Min (CALCIUM 600+D3 PLUS MINERALS) 600-800 MG-UNIT CHEW, 1 tablet with a meal, Disp: , Rfl:    chlordiazePOXIDE (LIBRIUM) 10 MG capsule, Take 1 capsule (10 mg total) by mouth 2 (two) times daily as needed for anxiety., Disp: 20 capsule, Rfl: 2   cyclobenzaprine (FLEXERIL) 5 MG tablet, Take 5 mg by mouth See admin instructions. Take 5 mg at bedtime, may take a second 5 mg dose in the middle of the night as needed for spasms, Disp: , Rfl:    fluticasone furoate-vilanterol (BREO ELLIPTA) 100-25 MCG/ACT AEPB, Inhale 1 puff into the lungs daily., Disp: 28 each, Rfl: 6   gabapentin (NEURONTIN) 300 MG capsule, Take 1-2 capsules (300-600 mg total) by mouth 3 (three) times daily as needed (Nerve pain)., Disp: 180 capsule, Rfl: 2   ibuprofen (ADVIL) 200 MG tablet, Take 1 tablet by mouth as needed., Disp: , Rfl:    metoprolol succinate (TOPROL-XL) 50 MG 24 hr tablet, Take 1 tablet (50 mg total) by mouth daily. Take with or immediately following a meal., Disp: 90 tablet, Rfl: 1   omeprazole (PRILOSEC) 20 MG capsule, Take 1 capsule (20 mg total) by mouth daily before breakfast., Disp: 90 capsule, Rfl: 1   rosuvastatin (CRESTOR) 10 MG tablet, Take 1 tablet (10 mg total) by mouth daily., Disp: 90 tablet, Rfl: 1   docusate sodium (COLACE) 100 MG capsule, Take 500 mg by mouth at bedtime. (Patient not taking: Reported on 07/30/2022), Disp: , Rfl:

## 2022-07-30 NOTE — Patient Instructions (Addendum)
Continue breo ellipta 1 puff daily until your nebulizer solutions come in.  Start performist and budesonide nebulizer treatments twice daily  Stop breo once you start the nebulizer treatments  Continue to use albuterol inhaler as needed  Follow up in 6 months

## 2022-08-05 ENCOUNTER — Encounter: Payer: Self-pay | Admitting: Pulmonary Disease

## 2022-08-07 LAB — HM MAMMOGRAPHY

## 2022-08-11 ENCOUNTER — Encounter: Payer: Self-pay | Admitting: Sports Medicine

## 2022-09-02 ENCOUNTER — Other Ambulatory Visit: Payer: Self-pay | Admitting: Neurosurgery

## 2022-09-11 ENCOUNTER — Encounter (HOSPITAL_COMMUNITY): Payer: Self-pay

## 2022-09-11 ENCOUNTER — Encounter: Payer: Self-pay | Admitting: Cardiology

## 2022-09-11 NOTE — Progress Notes (Signed)
Surgical Instructions    Your procedure is scheduled on Wednesday, September 17, 2022.  Report to [facility] Main Entrance "A" at 6:30 A.M., then check in with the Admitting office.  Call this number if you have problems the morning of surgery:  [phone]   If you have any questions prior to your surgery date call [phone]: Open Monday-Friday 8am-4pm If you experience any cold or flu symptoms such as cough, fever, chills, shortness of breath, etc. between now and your scheduled surgery, please notify us at the above number     Remember:  Do not eat or drink after midnight the night before your surgery    Take these medicines the morning of surgery with A SIP OF WATER:  budesonide (PULMICORT)  fluticasone furoate-vilanterol (BREO ELLIPTA)  formoterol (PERFOROMIST)  metoprolol succinate (TOPROL-XL)  omeprazole (PRILOSEC)  rosuvastatin (CRESTOR)    As needed: acetaminophen (TYLENOL)  albuterol (VENTOLIN HFA)  chlordiazePOXIDE (LIBRIUM)  gabapentin (NEURONTIN)    Follow your surgeon's instructions on when to stop Aspirin.  If no instructions were given by your surgeon then you will need to call the office to get those instructions.    As of today, STOP taking any Aleve, Naproxen, Ibuprofen, Motrin, Advil, Goody's, BC's, all herbal medications, fish oil, and all vitamins.           Do not wear jewelry or makeup. Do not wear lotions, powders, perfumes or deodorant. Do not shave 48 hours prior to surgery.   Do not bring valuables to the hospital. Do not wear nail polish, gel polish, artificial nails, or any other type of covering on natural nails (fingers and toes) If you have artificial nails or gel coating that need to be removed by a nail salon, please have this removed prior to surgery. Artificial nails or gel coating may interfere with anesthesia's ability to adequately monitor your vital signs.  [facility] is not responsible for any belongings or valuables.    Do NOT  Smoke (Tobacco/Vaping)  24 hours prior to your procedure  If you use a CPAP at night, you may bring your mask for your overnight stay.   Contacts, glasses, hearing aids, dentures or partials may not be worn into surgery, please bring cases for these belongings   For patients admitted to the hospital, discharge time will be determined by your treatment team.   Patients discharged the day of surgery will not be allowed to drive home, and someone needs to stay with them for 24 hours.   SURGICAL WAITING ROOM VISITATION Patients having surgery or a procedure may have no more than 2 support people in the waiting area - these visitors may rotate.   Children under the age of 53 must have an adult with them who is not the patient. If the patient needs to stay at the hospital during part of their recovery, the visitor guidelines for inpatient rooms apply. Pre-op nurse will coordinate an appropriate time for 1 support person to accompany patient in pre-op.  This support person may not rotate.   Please refer to [email or web address] for the visitor guidelines for Inpatients (after your surgery is over and you are in a regular room).    Special instructions:    Oral Hygiene is also important to reduce your risk of infection.  Remember - BRUSH YOUR TEETH THE MORNING OF SURGERY WITH YOUR REGULAR TOOTHPASTE   [facility]- Preparing For Surgery  Before surgery, you can play an important role. Because skin is not sterile, your  skin needs to be as free of germs as possible. You can reduce the number of germs on your skin by washing with CHG (chlorahexidine gluconate) Soap before surgery.  CHG is an antiseptic cleaner which kills germs and bonds with the skin to continue killing germs even after washing.     Please do not use if you have an allergy to CHG or antibacterial soaps. If your skin becomes reddened/irritated stop using the CHG.  Do not shave  (including legs and underarms) for at least 48 hours prior to first CHG shower. It is OK to shave your face.  Please follow these instructions carefully.     Shower the NIGHT BEFORE SURGERY and the MORNING OF SURGERY with CHG Soap.   If you chose to wash your hair, wash your hair first as usual with your normal shampoo. After you shampoo, rinse your hair and body thoroughly to remove the shampoo.  Then [employer] and genitals (private parts) with your normal soap and rinse thoroughly to remove soap.  After that Use CHG Soap as you would any other liquid soap. You can apply CHG directly to the skin and wash gently with a scrungie or a clean washcloth.   Apply the CHG Soap to your body ONLY FROM THE NECK DOWN.  Do not use on open wounds or open sores. Avoid contact with your eyes, ears, mouth and genitals (private parts). Wash Face and genitals (private parts)  with your normal soap.   Wash thoroughly, paying special attention to the area where your surgery will be performed.  Thoroughly rinse your body with warm water from the neck down.  DO NOT shower/wash with your normal soap after using and rinsing off the CHG Soap.  Pat yourself dry with a CLEAN TOWEL.  Wear CLEAN PAJAMAS to bed the night before surgery  Place CLEAN SHEETS on your bed the night before your surgery  DO NOT SLEEP WITH PETS.   Day of Surgery:  Take a shower with CHG soap. Wear Clean/Comfortable clothing the morning of surgery Do not apply any deodorants/lotions.   Remember to brush your teeth WITH YOUR REGULAR TOOTHPASTE.    If you received a COVID test during your pre-op visit, it is requested that you wear a mask when out in public, stay away from anyone that may not be feeling well, and notify your surgeon if you develop symptoms. If you have been in contact with anyone that has tested positive in the last 10 days, please notify your surgeon.    Please read over the following fact sheets that you were  given.

## 2022-09-12 ENCOUNTER — Telehealth: Payer: Self-pay | Admitting: Internal Medicine

## 2022-09-12 ENCOUNTER — Encounter (HOSPITAL_COMMUNITY)
Admission: RE | Admit: 2022-09-12 | Discharge: 2022-09-12 | Disposition: A | Discharge status: Home / Self Care | Payer: Medicare Other | Source: Ambulatory Visit | Attending: Neurosurgery | Admitting: Neurosurgery

## 2022-09-12 ENCOUNTER — Encounter (HOSPITAL_COMMUNITY): Payer: Self-pay

## 2022-09-12 ENCOUNTER — Other Ambulatory Visit: Payer: Self-pay

## 2022-09-12 VITALS — BP 147/59 | HR 77 | Temp 97.8°F | Resp 18 | Ht 62.0 in | Wt 195.6 lb

## 2022-09-12 DIAGNOSIS — D3501 Benign neoplasm of right adrenal gland: Secondary | ICD-10-CM | POA: Insufficient documentation

## 2022-09-12 DIAGNOSIS — J439 Emphysema, unspecified: Secondary | ICD-10-CM | POA: Insufficient documentation

## 2022-09-12 DIAGNOSIS — Z01812 Encounter for preprocedural laboratory examination: Secondary | ICD-10-CM | POA: Diagnosis present

## 2022-09-12 DIAGNOSIS — I700 Atherosclerosis of aorta: Secondary | ICD-10-CM | POA: Insufficient documentation

## 2022-09-12 DIAGNOSIS — E785 Hyperlipidemia, unspecified: Secondary | ICD-10-CM | POA: Diagnosis not present

## 2022-09-12 DIAGNOSIS — I10 Essential (primary) hypertension: Secondary | ICD-10-CM | POA: Diagnosis not present

## 2022-09-12 DIAGNOSIS — Z87891 Personal history of nicotine dependence: Secondary | ICD-10-CM | POA: Diagnosis not present

## 2022-09-12 DIAGNOSIS — R911 Solitary pulmonary nodule: Secondary | ICD-10-CM | POA: Insufficient documentation

## 2022-09-12 DIAGNOSIS — Z01818 Encounter for other preprocedural examination: Secondary | ICD-10-CM

## 2022-09-12 DIAGNOSIS — K760 Fatty (change of) liver, not elsewhere classified: Secondary | ICD-10-CM | POA: Diagnosis not present

## 2022-09-12 DIAGNOSIS — D3502 Benign neoplasm of left adrenal gland: Secondary | ICD-10-CM | POA: Diagnosis not present

## 2022-09-12 DIAGNOSIS — I2510 Atherosclerotic heart disease of native coronary artery without angina pectoris: Secondary | ICD-10-CM | POA: Insufficient documentation

## 2022-09-12 HISTORY — DX: Chronic obstructive pulmonary disease, unspecified: J44.9

## 2022-09-12 HISTORY — DX: Unspecified asthma, uncomplicated: J45.909

## 2022-09-12 LAB — CBC
HCT: 39.1 % (ref 36.0–46.0)
Hemoglobin: 13.5 g/dL (ref 12.0–15.0)
MCH: 31.6 pg (ref 26.0–34.0)
MCHC: 34.5 g/dL (ref 30.0–36.0)
MCV: 91.6 fL (ref 80.0–100.0)
Platelets: 199 10*3/uL (ref 150–400)
RBC: 4.27 MIL/uL (ref 3.87–5.11)
RDW: 12.5 % (ref 11.5–15.5)
WBC: 6.9 10*3/uL (ref 4.0–10.5)
nRBC: 0 % (ref 0.0–0.2)

## 2022-09-12 LAB — BASIC METABOLIC PANEL
Anion gap: 8 (ref 5–15)
BUN: 9 mg/dL (ref 8–23)
CO2: 27 mmol/L (ref 22–32)
Calcium: 9.2 mg/dL (ref 8.9–10.3)
Chloride: 105 mmol/L (ref 98–111)
Creatinine, Ser: 0.79 mg/dL (ref 0.44–1.00)
GFR, Estimated: >60 mL/min (ref >60)
Glucose, Bld: 125 mg/dL — ABNORMAL HIGH (ref 70–99)
Potassium: 4.1 mmol/L (ref 3.5–5.1)
Sodium: 140 mmol/L (ref 135–145)

## 2022-09-12 LAB — TYPE AND SCREEN
ABO/RH(D): B POS
Antibody Screen: NEGATIVE

## 2022-09-12 LAB — SURGICAL PCR SCREEN
MRSA, PCR: NEGATIVE
Staphylococcus aureus: NEGATIVE

## 2022-09-12 NOTE — Progress Notes (Signed)
PCP - New PCP [facility] same practice Cardiologist - Dr. Einar Gip with [facility]  PPM/ICD - Denies  Chest x-ray - 04/04/21 EKG - 06/20/22 Stress Test - 05/29/21 ECHO - 04/19/21 Cardiac Cath - Denies  Sleep Study - Denies  DM - Denies  Blood Thinner Instructions: N/A Aspirin Instructions: Per patient instructed by surgeon to stop 5-7 days prior  ERAS Protcol -No  COVID TEST- N/A  Patient denies any respiratory illness, PNA, Covid, or cold in the last 2 months.  Anesthesia review:   Patient denies shortness of breath, fever, cough and chest pain at PAT appointment   All instructions explained to the patient, with a verbal understanding of the material. Patient agrees to go over the instructions while at home for a better understanding.  The opportunity to ask questions was provided.

## 2022-09-12 NOTE — Telephone Encounter (Signed)
Fax received from Dr. Kary Kos with [facility] to perform a [place] on patient.  Patient needs surgery clearance. Surgery is 09/17/2022. Patient was seen on 07/30/2022. Office protocol is a risk assessment can be sent to surgeon if patient has been seen in 60 days or less.   Sending to Dr Erin Fulling for risk assessment or recommendations if patient needs to be seen in office prior to surgical procedure.

## 2022-09-13 NOTE — Telephone Encounter (Signed)
  ARISCAT Score Intermediate risk 13.3% risk of in-hospital post-op pulmonary complications (composite including respiratory failure, respiratory infection, pleural effusion, atelectasis, pneumothorax, bronchospasm, aspiration pneumonitis)  Thanks, Madie Reno

## 2022-09-15 NOTE — Progress Notes (Signed)
Anesthesia Chart Review:  Follows cardiology for history of HTN and HLD.  Stress test 11/22 was low risk.  Echo 10/22 showed normal LV systolic function, grade 1 DD, normal valves.  Last seen by Dr. Shellia Carwin 06/20/2022, stable at that time, no changes to management, 1 year follow-up recommended.  Follows with pulmonology for history of COPD.  She is a former smoker, 50 pack years, quit 2013.Janice Pace  She is maintained on nebulized formoterol and budesonide.  Perioperative risk stratification per telephone encounter by Dr. Erin Fulling on 09/13/2022 states, "ARISCAT Score Intermediate risk 13.3% risk of in-hospital post-op pulmonary complications (composite including respiratory failure, respiratory infection, pleural effusion, atelectasis, pneumothorax, bronchospasm, aspiration pneumonitis)."  History of hiatal hernia.  Labs reviewed, WNL.  EKG 06/20/2022: Sinus rhythm.  Rate 67.  Low-dose CT chest 06/18/2022: IMPRESSION: 1. Lung-RADS 3, probably benign findings. Short-term follow-up in 6 months is recommended with repeat low-dose chest CT without contrast (please use the following order, "CT CHEST LCS NODULE FOLLOW-UP W/O CM"). New indistinct posterior right upper lobe nodule measuring 5.6 mm in volume derived mean diameter. 2. Three-vessel coronary atherosclerosis. 3. Mild diffuse hepatic steatosis. 4. Stable bilateral adrenal adenomas, for which no follow-up imaging is recommended. 5. Aortic Atherosclerosis (ICD10-I70.0) and Emphysema (ICD10-J43.9).  TTE 04/16/2021 Left ventricle cavity is normal in size. Moderate concentric hypertrophy of the left ventricle. Normal global wall motion. Normal LV systolic function with visual EF 55-60%. Doppler evidence of grade I (impaired) diastolic dysfunction, normal LAP. Trace TR. No evidence of pulmonary hypertension.   MYOCARDIAL PERFUSION WITH LEXISCAN 05/29/2021 Nondiagnostic ECG stress. Myocardial perfusion is normal. LV is normal in small size in both  rest and stress images. Overall LV systolic function is normal without regional wall motion abnormalities. Stress LV EF: 47%. Visually LVEF is normal and may be underestimated due to low volume LV. No previous exam available for comparison. Low risk.     [facility]/Anesthesiology Phone [phone] 09/15/2022 4:28 PM

## 2022-09-15 NOTE — Telephone Encounter (Signed)
OV notes and clearance form have been faxed back to [facility]. Nothing further needed at this time.

## 2022-09-15 NOTE — Anesthesia Preprocedure Evaluation (Signed)
Anesthesia Evaluation  Patient identified by MRN, date of birth, ID band Patient awake    Reviewed: Allergy & Precautions, NPO status , Patient's Chart, lab work & pertinent test results, reviewed documented beta blocker date and time   Airway Mallampati: II  TM Distance: >3 FB Neck ROM: Limited    Dental  (+) Missing, Teeth Intact, Dental Advisory Given   Pulmonary asthma , COPD, former smoker   Pulmonary exam normal breath sounds clear to auscultation       Cardiovascular hypertension, Pt. on home beta blockers and Pt. on medications + Valvular Problems/Murmurs  Rhythm:Regular Rate:Normal - Systolic murmurs Echocardiogram 04/16/2021:  Left ventricle cavity is normal in size. Moderate concentric hypertrophy  of the left ventricle. Normal global wall motion. Normal LV systolic  function with visual EF 55-60%. Doppler evidence of grade I (impaired)  diastolic dysfunction, normal LAP.  Trace TR.  No evidence of pulmonary hypertension.    Lexiscan Nuclear stress test 05/29/2021:  Nondiagnostic ECG stress. Myocardial perfusion is normal. LV is normal in small size in both rest and stress images. Overall LV systolic function is normal without regional wall motion abnormalities. Stress LV EF: 47%. Visually LVEF is normal and may be underestimated due to low volume LV.  No previous exam available for comparison. Low risk.     Neuro/Psych  PSYCHIATRIC DISORDERS Anxiety      Neuromuscular disease    GI/Hepatic Neg liver ROS, hiatal hernia,GERD  Controlled and Medicated,,  Endo/Other  negative endocrine ROS    Renal/GU negative Renal ROS     Musculoskeletal  (+) Arthritis ,    Abdominal   Peds  Hematology negative hematology ROS (+)   Anesthesia Other Findings   Reproductive/Obstetrics                             Anesthesia Physical Anesthesia Plan  ASA: 3  Anesthesia Plan: General    Post-op Pain Management: Tylenol PO (pre-op)* and Gabapentin PO (pre-op)*   Induction: Intravenous, Rapid sequence and Cricoid pressure planned  PONV Risk Score and Plan: 3 and Ondansetron, Dexamethasone and Treatment may vary due to age or medical condition  Airway Management Planned: Oral ETT  Additional Equipment: Arterial line  Intra-op Plan:   Post-operative Plan: Extubation in OR  Informed Consent: I have reviewed the patients History and Physical, chart, labs and discussed the procedure including the risks, benefits and alternatives for the proposed anesthesia with the patient or authorized representative who has indicated his/her understanding and acceptance.     Dental advisory given  Plan Discussed with: CRNA  Anesthesia Plan Comments: (2 x PIV  PAT note by Karoline Caldwell, PA-C:  [place] [facility] for history of HTN and HLD.  Stress test 11/22 was low risk.  Echo 10/22 showed normal LV systolic function, grade 1 DD, normal valves.  Last seen by Dr. Shellia Carwin 06/20/2022, stable at that time, no changes to management, 1 year follow-up recommended.  Follows with pulmonology for history of COPD.  She is a former smoker, 50 pack years, quit 2013.Marland Kitchen  She is maintained on nebulized formoterol and budesonide.  Perioperative risk stratification per telephone encounter by Dr. Erin Fulling on 09/13/2022 states, "ARISCAT Score Intermediate risk 13.3% risk of in-hospital post-op pulmonary complications (composite including respiratory failure, respiratory infection, pleural effusion, atelectasis, pneumothorax, bronchospasm, aspiration pneumonitis)."  History of hiatal hernia.  Labs reviewed, WNL.  EKG 06/20/2022: Sinus rhythm.  Rate 67.  Low-dose CT chest 06/18/2022:  IMPRESSION: 1. Lung-RADS 3, probably benign findings. Short-term follow-up in 6 months is recommended with repeat low-dose chest CT without contrast (please use the following order, "CT CHEST LCS NODULE FOLLOW-UP W/O CM"). New  indistinct posterior right upper lobe nodule measuring 5.6 mm in volume derived mean diameter. 2. Three-vessel coronary atherosclerosis. 3. Mild diffuse hepatic steatosis. 4. Stable bilateral adrenal adenomas, for which no follow-up imaging is recommended. 5. Aortic Atherosclerosis (ICD10-I70.0) and Emphysema (ICD10-J43.9).  TTE 04/16/2021 Left ventricle cavity is normal in size. Moderate concentric hypertrophy of the left ventricle. Normal global wall motion. Normal LV systolic function with visual EF 55-60%. Doppler evidence of grade I (impaired) diastolic dysfunction, normal LAP. Trace TR. No evidence of pulmonary hypertension.  MYOCARDIAL PERFUSION WITH LEXISCAN 05/29/2021 Nondiagnostic ECG stress. Myocardial perfusion is normal. LV is normal in small size in both rest and stress images. Overall LV systolic function is normal without regional wall motion abnormalities. Stress LV EF: 47%. Visually LVEF is normal and may be underestimated due to low volume LV. No previous exam available for comparison. Low risk.    )        Anesthesia Quick Evaluation

## 2022-09-17 ENCOUNTER — Inpatient Hospital Stay (HOSPITAL_COMMUNITY): Payer: Medicare Other | Admitting: Physician Assistant

## 2022-09-17 ENCOUNTER — Inpatient Hospital Stay (HOSPITAL_COMMUNITY)
Admission: RE | Admit: 2022-09-17 | Discharge: 2022-09-18 | DRG: 460 | Disposition: A | Discharge status: Home / Self Care | Payer: Medicare Other | Attending: Neurosurgery | Admitting: Neurosurgery

## 2022-09-17 ENCOUNTER — Inpatient Hospital Stay (HOSPITAL_COMMUNITY): Payer: Medicare Other | Admitting: Certified Registered"

## 2022-09-17 ENCOUNTER — Other Ambulatory Visit: Payer: Self-pay

## 2022-09-17 ENCOUNTER — Encounter (HOSPITAL_COMMUNITY): Payer: Self-pay | Admitting: Neurosurgery

## 2022-09-17 ENCOUNTER — Encounter (HOSPITAL_COMMUNITY)
Admission: RE | Disposition: A | Discharge status: Home / Self Care | Payer: Self-pay | Source: Home / Self Care | Attending: Neurosurgery

## 2022-09-17 ENCOUNTER — Inpatient Hospital Stay (HOSPITAL_COMMUNITY): Payer: Medicare Other

## 2022-09-17 DIAGNOSIS — M40204 Unspecified kyphosis, thoracic region: Secondary | ICD-10-CM | POA: Diagnosis present

## 2022-09-17 DIAGNOSIS — M5134 Other intervertebral disc degeneration, thoracic region: Secondary | ICD-10-CM | POA: Diagnosis not present

## 2022-09-17 DIAGNOSIS — Z79899 Other long term (current) drug therapy: Secondary | ICD-10-CM

## 2022-09-17 DIAGNOSIS — Z888 Allergy status to other drugs, medicaments and biological substances status: Secondary | ICD-10-CM

## 2022-09-17 DIAGNOSIS — M960 Pseudarthrosis after fusion or arthrodesis: Secondary | ICD-10-CM | POA: Diagnosis present

## 2022-09-17 DIAGNOSIS — Z87891 Personal history of nicotine dependence: Secondary | ICD-10-CM

## 2022-09-17 DIAGNOSIS — Z882 Allergy status to sulfonamides status: Secondary | ICD-10-CM | POA: Diagnosis not present

## 2022-09-17 DIAGNOSIS — M5135 Other intervertebral disc degeneration, thoracolumbar region: Secondary | ICD-10-CM | POA: Diagnosis present

## 2022-09-17 DIAGNOSIS — G8929 Other chronic pain: Secondary | ICD-10-CM | POA: Diagnosis present

## 2022-09-17 DIAGNOSIS — M5136 Other intervertebral disc degeneration, lumbar region: Secondary | ICD-10-CM | POA: Diagnosis present

## 2022-09-17 DIAGNOSIS — Y838 Other surgical procedures as the cause of abnormal reaction of the patient, or of later complication, without mention of misadventure at the time of the procedure: Secondary | ICD-10-CM | POA: Diagnosis present

## 2022-09-17 DIAGNOSIS — M51369 Other intervertebral disc degeneration, lumbar region without mention of lumbar back pain or lower extremity pain: Secondary | ICD-10-CM | POA: Diagnosis present

## 2022-09-17 DIAGNOSIS — J449 Chronic obstructive pulmonary disease, unspecified: Secondary | ICD-10-CM

## 2022-09-17 DIAGNOSIS — Z981 Arthrodesis status: Secondary | ICD-10-CM | POA: Diagnosis not present

## 2022-09-17 DIAGNOSIS — I10 Essential (primary) hypertension: Secondary | ICD-10-CM

## 2022-09-17 DIAGNOSIS — J4489 Other specified chronic obstructive pulmonary disease: Secondary | ICD-10-CM | POA: Diagnosis present

## 2022-09-17 DIAGNOSIS — Z7951 Long term (current) use of inhaled steroids: Secondary | ICD-10-CM | POA: Diagnosis not present

## 2022-09-17 DIAGNOSIS — K5900 Constipation, unspecified: Secondary | ICD-10-CM | POA: Diagnosis present

## 2022-09-17 DIAGNOSIS — Z806 Family history of leukemia: Secondary | ICD-10-CM | POA: Diagnosis not present

## 2022-09-17 DIAGNOSIS — K219 Gastro-esophageal reflux disease without esophagitis: Secondary | ICD-10-CM | POA: Diagnosis present

## 2022-09-17 SURGERY — POSTERIOR LUMBAR FUSION 3 LEVEL
Anesthesia: General | Site: Back

## 2022-09-17 MED ORDER — PHENYLEPHRINE HCL-NACL 20-0.9 MG/250ML-% IV SOLN
INTRAVENOUS | Status: DC | PRN
  Administered 2022-09-17: 20 ug/min via INTRAVENOUS

## 2022-09-17 MED ORDER — GABAPENTIN 300 MG PO CAPS: 300.0000 mg | ORAL_CAPSULE | Freq: Once | ORAL | Status: DC

## 2022-09-17 MED ORDER — VITAMIN C 500 MG PO TABS
1000.0000 mg | ORAL_TABLET | Freq: Every day | ORAL | Status: DC
  Administered 2022-09-17: 1000 mg via ORAL
  Filled 2022-09-17: qty 2

## 2022-09-17 MED ORDER — ASPIRIN 81 MG PO TBEC
81.0000 mg | DELAYED_RELEASE_TABLET | Freq: Every day | ORAL | Status: DC
  Administered 2022-09-17: 81 mg via ORAL
  Filled 2022-09-17: qty 1

## 2022-09-17 MED ORDER — LIDOCAINE-EPINEPHRINE 1 %-1:100000 IJ SOLN
INTRAMUSCULAR | Status: DC | PRN
  Administered 2022-09-17: 10 mL

## 2022-09-17 MED ORDER — CHLORHEXIDINE GLUCONATE CLOTH 2 % EX PADS: 6.0000 | MEDICATED_PAD | Freq: Once | CUTANEOUS | Status: DC

## 2022-09-17 MED ORDER — BUPIVACAINE LIPOSOME 1.3 % IJ SUSP
INTRAMUSCULAR | Status: AC
  Filled 2022-09-17: qty 20

## 2022-09-17 MED ORDER — ACETAMINOPHEN 500 MG PO TABS: 1000.0000 mg | ORAL_TABLET | Freq: Four times a day (QID) | ORAL | Status: DC | PRN

## 2022-09-17 MED ORDER — SUFENTANIL CITRATE 50 MCG/ML IV SOLN
INTRAVENOUS | Status: DC | PRN
  Administered 2022-09-17 (×2): 5 ug via INTRAVENOUS
  Administered 2022-09-17: 10 ug via INTRAVENOUS

## 2022-09-17 MED ORDER — CYCLOBENZAPRINE HCL 5 MG PO TABS: 5.0000 mg | ORAL_TABLET | Freq: Every evening | ORAL | Status: DC | PRN

## 2022-09-17 MED ORDER — THROMBIN 20000 UNITS EX SOLR
CUTANEOUS | Status: AC
  Filled 2022-09-17: qty 20000

## 2022-09-17 MED ORDER — BUPIVACAINE HCL (PF) 0.25 % IJ SOLN
INTRAMUSCULAR | Status: AC
  Filled 2022-09-17: qty 30

## 2022-09-17 MED ORDER — BUPIVACAINE LIPOSOME 1.3 % IJ SUSP
INTRAMUSCULAR | Status: DC | PRN
  Administered 2022-09-17: 20 mL

## 2022-09-17 MED ORDER — ALUM & MAG HYDROXIDE-SIMETH 200-200-20 MG/5ML PO SUSP: 30.0000 mL | Freq: Four times a day (QID) | ORAL | Status: DC | PRN

## 2022-09-17 MED ORDER — LACTATED RINGERS IV SOLN: INTRAVENOUS | Status: DC | PRN

## 2022-09-17 MED ORDER — HYDROMORPHONE HCL 1 MG/ML IJ SOLN
0.2500 mg | INTRAMUSCULAR | Status: DC | PRN
  Administered 2022-09-17: 0.25 mg via INTRAVENOUS
  Administered 2022-09-17 (×2): 0.5 mg via INTRAVENOUS
  Administered 2022-09-17: 0.25 mg via INTRAVENOUS

## 2022-09-17 MED ORDER — CEFAZOLIN SODIUM-DEXTROSE 2-4 GM/100ML-% IV SOLN
2.0000 g | INTRAVENOUS | Status: AC
  Administered 2022-09-17: 2 g via INTRAVENOUS
  Filled 2022-09-17: qty 100

## 2022-09-17 MED ORDER — HYDROMORPHONE HCL 1 MG/ML IJ SOLN
INTRAMUSCULAR | Status: AC
  Filled 2022-09-17: qty 1

## 2022-09-17 MED ORDER — AMISULPRIDE (ANTIEMETIC) 5 MG/2ML IV SOLN: 10.0000 mg | Freq: Once | INTRAVENOUS | Status: AC | PRN

## 2022-09-17 MED ORDER — PROPOFOL 500 MG/50ML IV EMUL
INTRAVENOUS | Status: DC | PRN
  Administered 2022-09-17: 25 ug/kg/min via INTRAVENOUS

## 2022-09-17 MED ORDER — PHENOL 1.4 % MT LIQD: 1.0000 | OROMUCOSAL | Status: DC | PRN

## 2022-09-17 MED ORDER — LIDOCAINE 2% (20 MG/ML) 5 ML SYRINGE
INTRAMUSCULAR | Status: DC | PRN
  Administered 2022-09-17: 80 mg via INTRAVENOUS

## 2022-09-17 MED ORDER — FLUTICASONE FUROATE-VILANTEROL 100-25 MCG/ACT IN AEPB
1.0000 | INHALATION_SPRAY | Freq: Every day | RESPIRATORY_TRACT | Status: DC
  Filled 2022-09-17: qty 28

## 2022-09-17 MED ORDER — ONDANSETRON HCL 4 MG/2ML IJ SOLN
INTRAMUSCULAR | Status: DC | PRN
  Administered 2022-09-17: 4 mg via INTRAVENOUS

## 2022-09-17 MED ORDER — ROCURONIUM BROMIDE 10 MG/ML (PF) SYRINGE
PREFILLED_SYRINGE | INTRAVENOUS | Status: DC | PRN
  Administered 2022-09-17: 70 mg via INTRAVENOUS

## 2022-09-17 MED ORDER — ALBUTEROL SULFATE (2.5 MG/3ML) 0.083% IN NEBU: 3.0000 mL | INHALATION_SOLUTION | Freq: Four times a day (QID) | RESPIRATORY_TRACT | Status: DC | PRN

## 2022-09-17 MED ORDER — CHLORDIAZEPOXIDE HCL 5 MG PO CAPS
10.0000 mg | ORAL_CAPSULE | Freq: Two times a day (BID) | ORAL | Status: DC | PRN
  Filled 2022-09-17: qty 2

## 2022-09-17 MED ORDER — ACETAMINOPHEN 500 MG PO TABS: 1000.0000 mg | ORAL_TABLET | Freq: Once | ORAL | Status: DC

## 2022-09-17 MED ORDER — BUDESONIDE 0.5 MG/2ML IN SUSP
0.5000 mg | Freq: Two times a day (BID) | RESPIRATORY_TRACT | Status: DC
  Administered 2022-09-17 – 2022-09-18 (×2): 0.5 mg via RESPIRATORY_TRACT
  Filled 2022-09-17 (×3): qty 2

## 2022-09-17 MED ORDER — ACETAMINOPHEN 650 MG RE SUPP: 650.0000 mg | RECTAL | Status: DC | PRN

## 2022-09-17 MED ORDER — PHENYLEPHRINE 80 MCG/ML (10ML) SYRINGE FOR IV PUSH (FOR BLOOD PRESSURE SUPPORT)
PREFILLED_SYRINGE | INTRAVENOUS | Status: DC | PRN
  Administered 2022-09-17 (×4): 80 ug via INTRAVENOUS

## 2022-09-17 MED ORDER — DEXAMETHASONE SODIUM PHOSPHATE 10 MG/ML IJ SOLN
INTRAMUSCULAR | Status: DC | PRN
  Administered 2022-09-17: 10 mg via INTRAVENOUS

## 2022-09-17 MED ORDER — SUFENTANIL CITRATE 50 MCG/ML IV SOLN
INTRAVENOUS | Status: AC
  Filled 2022-09-17: qty 1

## 2022-09-17 MED ORDER — ACETAMINOPHEN 325 MG PO TABS: 650.0000 mg | ORAL_TABLET | ORAL | Status: DC | PRN

## 2022-09-17 MED ORDER — PANTOPRAZOLE SODIUM 40 MG PO TBEC
40.0000 mg | DELAYED_RELEASE_TABLET | Freq: Every day | ORAL | Status: DC
  Administered 2022-09-17: 40 mg via ORAL
  Filled 2022-09-17: qty 1

## 2022-09-17 MED ORDER — ONDANSETRON HCL 4 MG/2ML IJ SOLN: 4.0000 mg | Freq: Four times a day (QID) | INTRAMUSCULAR | Status: DC | PRN

## 2022-09-17 MED ORDER — THROMBIN 5000 UNITS EX SOLR
CUTANEOUS | Status: AC
  Filled 2022-09-17: qty 5000

## 2022-09-17 MED ORDER — PANTOPRAZOLE SODIUM 40 MG IV SOLR: 40.0000 mg | Freq: Every day | INTRAVENOUS | Status: DC

## 2022-09-17 MED ORDER — PROPOFOL 10 MG/ML IV BOLUS
INTRAVENOUS | Status: DC | PRN
  Administered 2022-09-17: 120 mg via INTRAVENOUS

## 2022-09-17 MED ORDER — MEPERIDINE HCL 25 MG/ML IJ SOLN: 6.2500 mg | INTRAMUSCULAR | Status: DC | PRN

## 2022-09-17 MED ORDER — SODIUM CHLORIDE 0.9% FLUSH: 3.0000 mL | INTRAVENOUS | Status: DC | PRN

## 2022-09-17 MED ORDER — ROSUVASTATIN CALCIUM 5 MG PO TABS
10.0000 mg | ORAL_TABLET | Freq: Every day | ORAL | Status: DC
  Administered 2022-09-17: 10 mg via ORAL
  Filled 2022-09-17 (×2): qty 2

## 2022-09-17 MED ORDER — METOPROLOL SUCCINATE ER 50 MG PO TB24
50.0000 mg | ORAL_TABLET | Freq: Every day | ORAL | Status: DC
  Administered 2022-09-17: 50 mg via ORAL
  Filled 2022-09-17: qty 1

## 2022-09-17 MED ORDER — THROMBIN 20000 UNITS EX SOLR: CUTANEOUS | Status: DC | PRN

## 2022-09-17 MED ORDER — SUGAMMADEX SODIUM 200 MG/2ML IV SOLN
INTRAVENOUS | Status: DC | PRN
  Administered 2022-09-17: 200 mg via INTRAVENOUS

## 2022-09-17 MED ORDER — HYDROMORPHONE HCL 1 MG/ML IJ SOLN: 0.5000 mg | INTRAMUSCULAR | Status: DC | PRN

## 2022-09-17 MED ORDER — LIDOCAINE-EPINEPHRINE 1 %-1:100000 IJ SOLN
INTRAMUSCULAR | Status: AC
  Filled 2022-09-17: qty 1

## 2022-09-17 MED ORDER — MIDAZOLAM HCL 2 MG/2ML IJ SOLN
INTRAMUSCULAR | Status: DC | PRN
  Administered 2022-09-17: 2 mg via INTRAVENOUS

## 2022-09-17 MED ORDER — SODIUM CHLORIDE 0.9% FLUSH
3.0000 mL | Freq: Two times a day (BID) | INTRAVENOUS | Status: DC
  Administered 2022-09-17: 3 mL via INTRAVENOUS

## 2022-09-17 MED ORDER — EPHEDRINE SULFATE-NACL 50-0.9 MG/10ML-% IV SOSY
PREFILLED_SYRINGE | INTRAVENOUS | Status: DC | PRN
  Administered 2022-09-17 (×2): 10 mg via INTRAVENOUS

## 2022-09-17 MED ORDER — AMISULPRIDE (ANTIEMETIC) 5 MG/2ML IV SOLN
INTRAVENOUS | Status: AC
  Administered 2022-09-17: 10 mg via INTRAVENOUS
  Filled 2022-09-17: qty 4

## 2022-09-17 MED ORDER — ONDANSETRON HCL 4 MG PO TABS: 4.0000 mg | ORAL_TABLET | Freq: Four times a day (QID) | ORAL | Status: DC | PRN

## 2022-09-17 MED ORDER — ORAL CARE MOUTH RINSE: 15.0000 mL | Freq: Once | OROMUCOSAL | Status: AC

## 2022-09-17 MED ORDER — 0.9 % SODIUM CHLORIDE (POUR BTL) OPTIME
TOPICAL | Status: DC | PRN
  Administered 2022-09-17: 1000 mL

## 2022-09-17 MED ORDER — MIDAZOLAM HCL 2 MG/2ML IJ SOLN
INTRAMUSCULAR | Status: AC
  Filled 2022-09-17: qty 2

## 2022-09-17 MED ORDER — PROPOFOL 10 MG/ML IV BOLUS
INTRAVENOUS | Status: AC
  Filled 2022-09-17: qty 20

## 2022-09-17 MED ORDER — HYDROCODONE-ACETAMINOPHEN 5-325 MG PO TABS
2.0000 | ORAL_TABLET | ORAL | Status: DC | PRN
  Administered 2022-09-17 – 2022-09-18 (×4): 2 via ORAL
  Filled 2022-09-17 (×5): qty 2

## 2022-09-17 MED ORDER — CEFAZOLIN SODIUM-DEXTROSE 2-4 GM/100ML-% IV SOLN
2.0000 g | Freq: Three times a day (TID) | INTRAVENOUS | Status: AC
  Administered 2022-09-17 (×2): 2 g via INTRAVENOUS
  Filled 2022-09-17 (×2): qty 100

## 2022-09-17 MED ORDER — DOCUSATE SODIUM 100 MG PO CAPS
500.0000 mg | ORAL_CAPSULE | Freq: Every day | ORAL | Status: DC
  Administered 2022-09-17: 500 mg via ORAL
  Filled 2022-09-17: qty 5

## 2022-09-17 MED ORDER — CYCLOBENZAPRINE HCL 10 MG PO TABS
10.0000 mg | ORAL_TABLET | Freq: Three times a day (TID) | ORAL | Status: DC | PRN
  Administered 2022-09-17 – 2022-09-18 (×2): 10 mg via ORAL
  Filled 2022-09-17 (×2): qty 1

## 2022-09-17 MED ORDER — MENTHOL 3 MG MT LOZG: 1.0000 | LOZENGE | OROMUCOSAL | Status: DC | PRN

## 2022-09-17 MED ORDER — LACTATED RINGERS IV SOLN: INTRAVENOUS | Status: DC

## 2022-09-17 MED ORDER — SODIUM CHLORIDE (PF) 0.9 % IJ SOLN
INTRAMUSCULAR | Status: AC
  Filled 2022-09-17: qty 10

## 2022-09-17 MED ORDER — GABAPENTIN 300 MG PO CAPS
300.0000 mg | ORAL_CAPSULE | Freq: Every day | ORAL | Status: DC
  Administered 2022-09-17: 300 mg via ORAL
  Filled 2022-09-17: qty 1

## 2022-09-17 MED ORDER — SODIUM CHLORIDE 0.9 % IV SOLN
250.0000 mL | INTRAVENOUS | Status: DC
  Administered 2022-09-17: 250 mL via INTRAVENOUS

## 2022-09-17 MED ORDER — CHLORHEXIDINE GLUCONATE 0.12 % MT SOLN
15.0000 mL | Freq: Once | OROMUCOSAL | Status: AC
  Administered 2022-09-17: 15 mL via OROMUCOSAL
  Filled 2022-09-17: qty 15

## 2022-09-17 MED ORDER — ARFORMOTEROL TARTRATE 15 MCG/2ML IN NEBU
15.0000 ug | INHALATION_SOLUTION | Freq: Two times a day (BID) | RESPIRATORY_TRACT | Status: DC
  Administered 2022-09-17 – 2022-09-18 (×2): 15 ug via RESPIRATORY_TRACT
  Filled 2022-09-17 (×4): qty 2

## 2022-09-17 SURGICAL SUPPLY — 79 items
ADH SKN CLS APL DERMABOND .7 (GAUZE/BANDAGES/DRESSINGS) ×1
APL SKNCLS STERI-STRIP NONHPOA (GAUZE/BANDAGES/DRESSINGS) ×1
BAG COUNTER SPONGE SURGICOUNT (BAG) ×2 IMPLANT
BAG SPNG CNTER NS LX DISP (BAG) ×1
BASKET BONE COLLECTION (BASKET) ×2 IMPLANT
BENZOIN TINCTURE PRP APPL 2/3 (GAUZE/BANDAGES/DRESSINGS) ×2 IMPLANT
BLADE BONE MILL MEDIUM (MISCELLANEOUS) ×2 IMPLANT
BLADE CLIPPER SURG (BLADE) IMPLANT
BLADE SURG 11 STRL SS (BLADE) ×2 IMPLANT
BONE FIBERS PLIAFX 10 (Bone Implant) ×2 IMPLANT
BONE VIVIGEN FORMABLE 10CC (Bone Implant) ×1 IMPLANT
BUR CUTTER 7.0 ROUND (BURR) ×2 IMPLANT
BUR MATCHSTICK NEURO 3.0 LAGG (BURR) ×2 IMPLANT
CANISTER SUCT 3000ML PPV (MISCELLANEOUS) ×2 IMPLANT
CAP LOCKING THREADED (Cap) IMPLANT
CNTNR URN SCR LID CUP LEK RST (MISCELLANEOUS) ×2 IMPLANT
CONNECTOR INLINE 5.5-5.5 (Connector) IMPLANT
CONT SPEC 4OZ STRL OR WHT (MISCELLANEOUS) ×1
COVER BACK TABLE 60X90IN (DRAPES) ×2 IMPLANT
DERMABOND ADVANCED .7 DNX12 (GAUZE/BANDAGES/DRESSINGS) ×2 IMPLANT
DRAPE C-ARM 42X72 X-RAY (DRAPES) ×2 IMPLANT
DRAPE C-ARMOR (DRAPES) IMPLANT
DRAPE HALF SHEET 40X57 (DRAPES) IMPLANT
DRAPE LAPAROTOMY 100X72X124 (DRAPES) ×2 IMPLANT
DRAPE SURG 17X23 STRL (DRAPES) ×2 IMPLANT
DRSG OPSITE 4X5.5 SM (GAUZE/BANDAGES/DRESSINGS) ×2 IMPLANT
DRSG OPSITE POSTOP 4X10 (GAUZE/BANDAGES/DRESSINGS) IMPLANT
DRSG OPSITE POSTOP 4X8 (GAUZE/BANDAGES/DRESSINGS) ×2 IMPLANT
DURAPREP 26ML APPLICATOR (WOUND CARE) ×2 IMPLANT
ELECT BLADE 4.0 EZ CLEAN MEGAD (MISCELLANEOUS) ×1
ELECT REM PT RETURN 9FT ADLT (ELECTROSURGICAL) ×1
ELECTRODE BLDE 4.0 EZ CLN MEGD (MISCELLANEOUS) IMPLANT
ELECTRODE REM PT RTRN 9FT ADLT (ELECTROSURGICAL) ×2 IMPLANT
EVACUATOR 1/8 PVC DRAIN (DRAIN) IMPLANT
EVACUATOR 3/16  PVC DRAIN (DRAIN) ×1
EVACUATOR 3/16 PVC DRAIN (DRAIN) ×2 IMPLANT
GAUZE 4X4 16PLY ~~LOC~~+RFID DBL (SPONGE) ×2 IMPLANT
GAUZE SPONGE 4X4 12PLY STRL (GAUZE/BANDAGES/DRESSINGS) ×2 IMPLANT
GLOVE BIO SURGEON STRL SZ7 (GLOVE) IMPLANT
GLOVE BIO SURGEON STRL SZ8 (GLOVE) ×4 IMPLANT
GLOVE BIOGEL PI IND STRL 7.0 (GLOVE) IMPLANT
GLOVE EXAM NITRILE XL STR (GLOVE) IMPLANT
GLOVE INDICATOR 8.5 STRL (GLOVE) ×4 IMPLANT
GOWN STRL REUS W/ TWL LRG LVL3 (GOWN DISPOSABLE) IMPLANT
GOWN STRL REUS W/ TWL XL LVL3 (GOWN DISPOSABLE) ×4 IMPLANT
GOWN STRL REUS W/TWL 2XL LVL3 (GOWN DISPOSABLE) IMPLANT
GOWN STRL REUS W/TWL LRG LVL3 (GOWN DISPOSABLE)
GOWN STRL REUS W/TWL XL LVL3 (GOWN DISPOSABLE) ×2
GRAFT BNE FBR PLIAFX PRIME 10 (Bone Implant) IMPLANT
GRAFT BNE MATRIX VG FRMBL L 10 (Bone Implant) IMPLANT
KIT BASIN OR (CUSTOM PROCEDURE TRAY) ×2 IMPLANT
KIT GRAFTMAG DEL NEURO DISP (NEUROSURGERY SUPPLIES) IMPLANT
KIT INFUSE SMALL (Orthopedic Implant) IMPLANT
KIT TURNOVER KIT B (KITS) ×2 IMPLANT
MILL BONE PREP (MISCELLANEOUS) ×2 IMPLANT
NDL HYPO 21X1.5 SAFETY (NEEDLE) ×2 IMPLANT
NDL HYPO 25X1 1.5 SAFETY (NEEDLE) ×2 IMPLANT
NEEDLE HYPO 21X1.5 SAFETY (NEEDLE) ×1 IMPLANT
NEEDLE HYPO 25X1 1.5 SAFETY (NEEDLE) ×1 IMPLANT
NS IRRIG 1000ML POUR BTL (IV SOLUTION) ×2 IMPLANT
PACK LAMINECTOMY NEURO (CUSTOM PROCEDURE TRAY) ×2 IMPLANT
PAD ARMBOARD 7.5X6 YLW CONV (MISCELLANEOUS) ×6 IMPLANT
RASP 3.0MM (RASP) IMPLANT
ROD CREO STRAIGHT 125MM (Rod) IMPLANT
SCREW CREO AMP 5.5X35 (Screw) IMPLANT
SCREW PA THRD CREO TULIP 5.5X4 (Head) IMPLANT
SPIKE FLUID TRANSFER (MISCELLANEOUS) ×2 IMPLANT
SPONGE SURGIFOAM ABS GEL 100 (HEMOSTASIS) ×2 IMPLANT
SPONGE T-LAP 4X18 ~~LOC~~+RFID (SPONGE) IMPLANT
STRIP CLOSURE SKIN 1/2X4 (GAUZE/BANDAGES/DRESSINGS) ×4 IMPLANT
SUT VIC AB 0 CT1 18XCR BRD8 (SUTURE) ×4 IMPLANT
SUT VIC AB 0 CT1 8-18 (SUTURE) ×2
SUT VIC AB 2-0 CT1 18 (SUTURE) ×4 IMPLANT
SUT VIC AB 4-0 PS2 27 (SUTURE) ×2 IMPLANT
SYR 20ML LL LF (SYRINGE) ×2 IMPLANT
TOWEL GREEN STERILE (TOWEL DISPOSABLE) ×2 IMPLANT
TOWEL GREEN STERILE FF (TOWEL DISPOSABLE) ×2 IMPLANT
TRAY FOLEY MTR SLVR 16FR STAT (SET/KITS/TRAYS/PACK) ×2 IMPLANT
WATER STERILE IRR 1000ML POUR (IV SOLUTION) ×2 IMPLANT

## 2022-09-17 NOTE — Anesthesia Postprocedure Evaluation (Signed)
Anesthesia Post Note  Patient: Janice Pace  Procedure(s) Performed: THORACIC TEN-THORACIC ELEVEN - THORACIC ELEVEN-THORACIC TWELVE - THORACIC TWELVE-LUMBAR ONE POSTERIOR THORACOLUMBAR  FUSION WITH INSTRUMENTATION (Back)     Patient location during evaluation: PACU Anesthesia Type: General Level of consciousness: sedated and patient cooperative Pain management: pain level controlled Vital Signs Assessment: post-procedure vital signs reviewed and stable Respiratory status: spontaneous breathing Cardiovascular status: stable Anesthetic complications: no   No notable events documented.  Last Vitals:  Vitals:   09/17/22 1531 09/17/22 1922  BP: (!) 141/71 (!) 141/69  Pulse: 72 80  Resp: 14 18  Temp: (!) 36.4 C 36.6 C  SpO2: 98% 97%    Last Pain:  Vitals:   09/17/22 1922  TempSrc: Oral  PainSc:                  [name]

## 2022-09-17 NOTE — H&P (Signed)
Janice Pace is an 77 y.o. female.   Chief Complaint: Back pain HPI: 77 year old female previously undergone L1-S1 fusion over successive operations and has developed progressive worsening back pain.  Imaging has shown progressive degeneration deterioration above her fusion primarily at T12-L1 patient did respond preoperatively to facet blocks in a temporary fashion and ablation treatments however nothing was long-lasting and pain has persisted.  Due to her progression of clinical syndrome imaging findings of a conservative treatment I recommended extension of her fusion up to T10 with pedicle screws placed at T10, T11, T12 tying into her old construct.  I have extensively gone over the risks and benefits of that operation with her as well as perioperative course expectations of outcome and alternatives to surgery and she understands and agrees to proceed forward.  Past Medical History:  Diagnosis Date   Arthritis    Asthma    Bronchitis    Cancer ([facility])    dermatosivros arcoma and protuverans   Chronic back pain    degenerative disc disease   Constipation    stool softener daily   Constipation    COPD (chronic obstructive pulmonary disease) ([facility])    GERD (gastroesophageal reflux disease)    takes Prilosec daily   GERD (gastroesophageal reflux disease)    Heart murmur    Heart valve disorder    Hemorrhoids    History of hiatal hernia    Insomnia    d/t chantix   Joint pain    Nocturia    Personal history of colonic adenoma 03/13/2003   03/13/2003 - 5 mm adenoma   Pre-diabetes    Prediabetes     Past Surgical History:  Procedure Laterality Date   ABDOMINAL HYSTERECTOMY     ANTERIOR LAT LUMBAR FUSION N/A 02/27/2021   Procedure: Anterior lateral interbody fusion - lateral two - lateral three - lateral three - lateral four with exploration fusion L4-S1;  Surgeon: Kary Kos, MD;  Location: [place] [facility];  Service: Neurosurgery;  Laterality: N/A;   BACK SURGERY  1988   BACK SURGERY  09-03-11    rods,screws x 8    BUNIONECTOMY     bilateral   BUNIONECTOMY     bil feet   COLONOSCOPY     dermatosibrosarcoma protuberans     LAMINECTOMY WITH POSTERIOR LATERAL ARTHRODESIS LEVEL 3 N/A 02/27/2021   Procedure: Posterior augmentation with pedicle screws Latreal one - lateral four - Posterior Lateral and Interbody fusion;  Surgeon: Kary Kos, MD;  Location: [facility];  Service: Neurosurgery;  Laterality: N/A;   PARTIAL HYSTERECTOMY     RECTOCELE REPAIR     Rectum Repair     RESECTION TUMOR WRIST RADICAL     x 2 rt   TUBAL LIGATION  1974   TUBAL LIGATION     WRIST SURGERY     left    Family History  Problem Relation Age of Onset   Leukemia Mother    Anesthesia problems Neg Hx    Hypotension Neg Hx    Malignant hyperthermia Neg Hx    Pseudochol deficiency Neg Hx    Colon cancer Neg Hx    Social History:  reports that she quit smoking about 11 years ago. Her smoking use included cigarettes. She has a 50.00 pack-year smoking history. She has never used smokeless tobacco. She reports that she does not drink alcohol and does not use drugs.  Allergies:  Allergies  Allergen Reactions   Sulfa Antibiotics Hives and Itching   Atorvastatin Other (  See Comments)    mucsle aches   Metformin Hcl Other (See Comments)    joint pains and muscle aches    Medications Prior to Admission  Medication Sig Dispense Refill   acetaminophen (TYLENOL) 500 MG tablet Take 1,000 mg by mouth every 6 (six) hours as needed for moderate pain or headache.     albuterol (VENTOLIN HFA) 108 (90 Base) MCG/ACT inhaler Inhale 1-2 puffs into the lungs every 6 (six) hours as needed for wheezing or shortness of breath. 8 g 1   ascorbic acid (VITAMIN C) 1000 MG tablet Take 1 tablet by mouth daily.     aspirin 81 MG EC tablet Take 1 tablet by mouth daily.     budesonide (PULMICORT) 0.5 MG/2ML nebulizer solution Take 0.5 mg by nebulization 2 (two) times daily.     chlordiazePOXIDE (LIBRIUM) 10 MG capsule Take 1 capsule  (10 mg total) by mouth 2 (two) times daily as needed for anxiety. 20 capsule 2   cyclobenzaprine (FLEXERIL) 5 MG tablet Take 5 mg by mouth at bedtime as needed for muscle spasms.     docusate sodium (COLACE) 100 MG capsule Take 500 mg by mouth at bedtime.     formoterol (PERFOROMIST) 20 MCG/2ML nebulizer solution Take 20 mcg by nebulization 2 (two) times daily.     gabapentin (NEURONTIN) 300 MG capsule Take 1-2 capsules (300-600 mg total) by mouth 3 (three) times daily as needed (Nerve pain). (Patient taking differently: Take 300 mg by mouth at bedtime.) 180 capsule 2   metoprolol succinate (TOPROL-XL) 50 MG 24 hr tablet Take 1 tablet (50 mg total) by mouth daily. Take with or immediately following a meal. 90 tablet 1   omeprazole (PRILOSEC) 20 MG capsule Take 1 capsule (20 mg total) by mouth daily before breakfast. (Patient taking differently: Take 20 mg by mouth daily as needed (acid reflux).) 90 capsule 1   rosuvastatin (CRESTOR) 10 MG tablet Take 1 tablet (10 mg total) by mouth daily. 90 tablet 1   fluticasone furoate-vilanterol (BREO ELLIPTA) 100-25 MCG/ACT AEPB Inhale 1 puff into the lungs daily. (Patient not taking: Reported on 09/09/2022) 28 each 6    No results found for this or any previous visit (from the past 48 hour(s)). No results found.  Review of Systems  Musculoskeletal:  Positive for back pain.    Blood pressure (!) 144/75, pulse 85, temperature 97.6 F (36.4 C), temperature source Oral, resp. rate 18, height '5\' 2"'$  (1.575 m), weight 88.5 kg, SpO2 97 %. Physical Exam HENT:     Head: Normocephalic.     Right Ear: Tympanic membrane normal.     Nose: Nose normal.     Mouth/Throat:     Mouth: Mucous membranes are moist.  Eyes:     Pupils: Pupils are equal, round, and reactive to light.  Cardiovascular:     Rate and Rhythm: Normal rate.     Pulses: Normal pulses.  Pulmonary:     Effort: Pulmonary effort is normal.  Abdominal:     General: Abdomen is flat.   Musculoskeletal:        General: Normal range of motion.  Neurological:     Mental Status: She is alert.     Comments: Patient is awake and alert strength is 5 out of 5 iliopsoas, quads, hamstrings, gastroc, into tibialis, and EHL.      Assessment/Plan 77 year old presents for extension of her fusion T10-L1.  Elaina Hoops, MD 09/17/2022, 8:13 AM

## 2022-09-17 NOTE — Op Note (Signed)
Preoperative diagnosis: Segmental degeneration above her previous fusion from L1-S1 with degenerative disc disease and degeneration at T12-L1 T11-12 and T10-11.  Possible pseudoarthrosis L1-L2  Postoperative diagnosis: Same.  Procedure: #1 exploration fusion removal of hardware with cutting the rod below the L1 screw and removal of bilateral L1 pedicle screws.  2.  Pedicle screw fixation T10-11 T11-12 T12-L1 utilizing the globus Creo modular pedicle screw set and globus addition with end and connections.  3.  Posterolateral arthrodesis T10-11 T11-12 T12-L1 and L1-L2.  Surgeon: Kary Kos.  Assistant: Nash Shearer.  Anesthesia: General.  EBL: Minimal.  HPI: 77 year old female previous L1-S1 fusion over multiple operations presented with progressive degeneration above her fusion at T11-12 and T12-L1 patient was placed through facet blocks and injections and at that site and improved significantly however they were long-lasting.  Due to her progression clinical syndrome imaging findings of a conservative treatment I recommended extension of her fusion up to T10 I extensively went over the risks and benefits of that operation with her as well as perioperative course expectations of outcome and alternatives of surgery and she understood and agreed to proceed forward.  Operative procedure: Patient was brought into the OR was induced under general anesthesia positioned prone the Philbin frame her back was prepped and draped in routine sterile fashion.  Roll incision was marked out and extended cephalad after infiltration of 10 cc lidocaine with epi.  Subperiosteal section was then carried out on the lamina of T9, T10, T11, T12 and exposed the hardware and construct from L1-L2.  There was extensive bony overgrowth of the L1 screw this was all removed with a Leksell rongeur exposing the top of the L1 screw head the rod was exposed down to just L2 and I exposed the posterior lateral bone from L2 all the  way up to T10.  Utilizing AP and lateral fluoroscopy pedicle screws were placed bilaterally at T10, T11, and T12 and all screws had excellent purchase.  Postop imaging confirmed good position of screw placement.  Then I then looked at the kyphosis that was happening at the level of T12-L1 and the angle of the L1 screws and with extensive bony overgrowth of the L1 screws I felt it was possible the screws might of loosened and may be a sign of some underlying pseudoarthrosis however there was extensive posterior lateral bone from L1-L2 so I think at least that part of the fusion was solid.  But I did elect to cut the rod right below the L1 screw removed bilateral L1 screws and then contoured and bent the rod after cleaning up around the residual rod and placing end and connectors.  Then the new rods were contoured but prior to rod placement I aggressively decorticated irrigation at L1-T12 L1 T11-T12 and T10-11 packed and extensive mount of both BMP cortical fibers Vivigen and some bone dust and residual autograft posterior laterally at T10-11 T11-12 T12-L1 and L1-L2.  Then we assembled the heads we advanced the screws a little bit then contoured the rod and anchored everything in place.  At this point we packed some additional bone graft underneath the rod along lateral to it then injected Exparel in the fascia placed a medium Hemovac drain and closed the wound in layers with interrupted Vicryl and a running 4 subcuticular.  Dermabond benzoin Steri-Strips and a sterile dressing was applied patient recovery in stable condition.  At the end the case all needle count sponge counts were correct.

## 2022-09-17 NOTE — Transfer of Care (Signed)
Immediate Anesthesia Transfer of Care Note  Patient: NOBUKO PROTO  Procedure(s) Performed: THORACIC TEN-THORACIC ELEVEN - THORACIC ELEVEN-THORACIC TWELVE - THORACIC TWELVE-LUMBAR ONE POSTERIOR THORACOLUMBAR  FUSION WITH INSTRUMENTATION (Back)  Patient Location: PACU  Anesthesia Type:General  Level of Consciousness: awake, oriented, and drowsy  Airway & Oxygen Therapy: Patient Spontanous Breathing and Patient connected to nasal cannula oxygen  Post-op Assessment: Report given to RN, Post -op Vital signs reviewed and stable, and Patient moving all extremities  Post vital signs: Reviewed and stable  Last Vitals:  Vitals Value Taken Time  BP 126/77 09/17/22 1130  Temp    Pulse 83 09/17/22 1133  Resp 20 09/17/22 1133  SpO2 96 % 09/17/22 1133  Vitals shown include unvalidated device data.  Last Pain:  Vitals:   09/17/22 0707  TempSrc:   PainSc: 0-No pain         Complications: No notable events documented.

## 2022-09-17 NOTE — Anesthesia Procedure Notes (Signed)
Procedure Name: Intubation Date/Time: 09/17/2022 8:48 AM  Performed by: Anastasio Auerbach, CRNAPre-anesthesia Checklist: Patient identified, Emergency Drugs available, Suction available and Patient being monitored Patient Re-evaluated:Patient Re-evaluated prior to induction Oxygen Delivery Method: Circle system utilized Preoxygenation: Pre-oxygenation with 100% oxygen Induction Type: IV induction Ventilation: Mask ventilation without difficulty Laryngoscope Size: Mac and 3 Grade View: Grade II Tube type: Oral Number of attempts: 1 Airway Equipment and Method: Stylet and Oral airway Placement Confirmation: ETT inserted through vocal cords under direct vision, positive ETCO2 and breath sounds checked- equal and bilateral Secured at: 22 cm Tube secured with: Tape Dental Injury: Teeth and Oropharynx as per pre-operative assessment

## 2022-09-17 NOTE — Anesthesia Procedure Notes (Signed)
Arterial Line Insertion Start/End3/12/2022 8:50 AM, 09/17/2022 9:05 AM Performed by: Anastasio Auerbach, CRNA, CRNA  Patient location: OR. Preanesthetic checklist: patient identified, IV checked, site marked, risks and benefits discussed, surgical consent, monitors and equipment checked, pre-op evaluation, timeout performed and anesthesia consent Left, radial was placed Catheter size: 20 G Hand hygiene performed , maximum sterile barriers used  and Seldinger technique used Allen's test indicative of satisfactory collateral circulation Attempts: 2 Procedure performed without using ultrasound guided technique. Following insertion, Biopatch. Post procedure assessment: normal

## 2022-09-18 MED ORDER — OXYCODONE-ACETAMINOPHEN 5-325 MG PO TABS: 1.0000 | ORAL_TABLET | ORAL | 0 refills | Status: DC | PRN

## 2022-09-18 MED ORDER — CYCLOBENZAPRINE HCL 5 MG PO TABS: 5.0000 mg | ORAL_TABLET | Freq: Every evening | ORAL | 0 refills | Status: DC | PRN

## 2022-09-18 NOTE — Discharge Summary (Signed)
Physician Discharge Summary  Patient ID: Janice Pace MRN: [id number] DOB/AGE: Jan 24, 1946 77 y.o.  Admit date: 09/17/2022 Discharge date: 09/18/2022  Admission Diagnoses:  Segmental degeneration above her previous fusion from L1-S1 with degenerative disc disease and degeneration at T12-L1 T11-12 and T10-11. Possible pseudoarthrosis L1-L2    Discharge Diagnoses: same   Discharged Condition: good  Hospital Course: The patient was admitted on 09/17/2022 and taken to the operating room where the patient underwent extension of fusion T10-L1. The patient tolerated the procedure well and was taken to the recovery room and then to the floor in stable condition. The hospital course was routine. There were no complications. The wound remained clean dry and intact. Pt had appropriate back soreness. No complaints of leg pain or new N/T/W. The patient remained afebrile with stable vital signs, and tolerated a regular diet. The patient continued to increase activities, and pain was well controlled with oral pain medications.   Consults: None  Significant Diagnostic Studies:  Results for orders placed or performed during the hospital encounter of 09/12/22  Surgical pcr screen   Specimen: Nasal Mucosa; Nasal Swab  Result Value Ref Range   MRSA, PCR NEGATIVE NEGATIVE   Staphylococcus aureus NEGATIVE NEGATIVE  Basic metabolic panel per protocol  Result Value Ref Range   Sodium 140 135 - 145 mmol/L   Potassium 4.1 3.5 - 5.1 mmol/L   Chloride 105 98 - 111 mmol/L   CO2 27 22 - 32 mmol/L   Glucose, Bld 125 (H) 70 - 99 mg/dL   BUN 9 8 - 23 mg/dL   Creatinine, Ser 0.79 0.44 - 1.00 mg/dL   Calcium 9.2 8.9 - 10.3 mg/dL   GFR, Estimated >60 >60 mL/min   Anion gap 8 5 - 15  CBC per protocol  Result Value Ref Range   WBC 6.9 4.0 - 10.5 K/uL   RBC 4.27 3.87 - 5.11 MIL/uL   Hemoglobin 13.5 12.0 - 15.0 g/dL   HCT 39.1 36.0 - 46.0 %   MCV 91.6 80.0 - 100.0 fL   MCH 31.6 26.0 - 34.0 pg   MCHC 34.5 30.0 -  36.0 g/dL   RDW 12.5 11.5 - 15.5 %   Platelets 199 150 - 400 K/uL   nRBC 0.0 0.0 - 0.2 %  Type and screen [place]  Result Value Ref Range   ABO/RH(D) B POS    Antibody Screen NEG    Sample Expiration 09/26/2022,2359    Extend sample reason      NO TRANSFUSIONS OR PREGNANCY IN THE PAST 3 MONTHS Performed at [facility], [address] [place], [place] [address]     DG THORACOLUMBAR SPINE  Result Date: 09/17/2022 CLINICAL DATA:  Thoracolumbar fusion, intraoperative examination EXAM: THORACOLUMBAR SPINE 1V COMPARISON:  04/29/2022 FINDINGS: Two fluoroscopic intraoperative radiographs demonstrate interval placement of bilateral pedicle screws at T11-L1. Pre-existing surgical hardware partially visualized at L2-3. Normal alignment of the visualized thoracolumbar segment. Fluoroscopic images: 2 Fluoroscopic time: 64.7 seconds Fluoroscopic dose: 42.74 mGy IMPRESSION: 1. Intraoperative radiographs during thoracolumbar fusion. Electronically Signed   By: Fidela Salisbury M.D.   On: 09/17/2022 10:40   DG C-Arm 1-60 Min-No Report  Result Date: 09/17/2022 Fluoroscopy was utilized by the requesting physician.  No radiographic interpretation.    Antibiotics:  Anti-infectives (From admission, onward)    Start     Dose/Rate Route Frequency Ordered Stop   09/17/22 1245  ceFAZolin (ANCEF) IVPB 2g/100 mL premix  2 g 200 mL/hr over 30 Minutes Intravenous Every 8 hours 09/17/22 1236 09/17/22 2122   09/17/22 0715  ceFAZolin (ANCEF) IVPB 2g/100 mL premix        2 g 200 mL/hr over 30 Minutes Intravenous On call to O.R. 09/17/22 0703 09/17/22 0919       Discharge Exam: Blood pressure 120/73, pulse 76, temperature 98.4 F (36.9 C), temperature source Oral, resp. rate 18, height '5\' 2"'$  (1.575 m), weight 88.5 kg, SpO2 99 %. Neurologic: Grossly normal Ambulating and voiding well incision cdi   Discharge Medications:   Allergies as of 09/18/2022       Reactions   Sulfa  Antibiotics Hives, Itching   Atorvastatin Other (See Comments)   mucsle aches   Metformin Hcl Other (See Comments)   joint pains and muscle aches        Medication List     TAKE these medications    acetaminophen 500 MG tablet Commonly known as: TYLENOL Take 1,000 mg by mouth every 6 (six) hours as needed for moderate pain or headache.   albuterol 108 (90 Base) MCG/ACT inhaler Commonly known as: VENTOLIN HFA Inhale 1-2 puffs into the lungs every 6 (six) hours as needed for wheezing or shortness of breath.   ascorbic acid 1000 MG tablet Commonly known as: VITAMIN C Take 1 tablet by mouth daily.   aspirin EC 81 MG tablet Take 1 tablet by mouth daily.   budesonide 0.5 MG/2ML nebulizer solution Commonly known as: PULMICORT Take 0.5 mg by nebulization 2 (two) times daily.   chlordiazePOXIDE 10 MG capsule Commonly known as: LIBRIUM Take 1 capsule (10 mg total) by mouth 2 (two) times daily as needed for anxiety.   cyclobenzaprine 5 MG tablet Commonly known as: FLEXERIL Take 1 tablet (5 mg total) by mouth at bedtime as needed for muscle spasms.   docusate sodium 100 MG capsule Commonly known as: COLACE Take 500 mg by mouth at bedtime.   fluticasone furoate-vilanterol 100-25 MCG/ACT Aepb Commonly known as: Breo Ellipta Inhale 1 puff into the lungs daily.   formoterol 20 MCG/2ML nebulizer solution Commonly known as: PERFOROMIST Take 20 mcg by nebulization 2 (two) times daily.   gabapentin 300 MG capsule Commonly known as: NEURONTIN Take 1-2 capsules (300-600 mg total) by mouth 3 (three) times daily as needed (Nerve pain). What changed:  how much to take when to take this   metoprolol succinate 50 MG 24 hr tablet Commonly known as: TOPROL-XL Take 1 tablet (50 mg total) by mouth daily. Take with or immediately following a meal.   omeprazole 20 MG capsule Commonly known as: PRILOSEC Take 1 capsule (20 mg total) by mouth daily before breakfast. What changed:   when to take this reasons to take this   oxyCODONE-acetaminophen 5-325 MG tablet Commonly known as: Percocet Take 1 tablet by mouth every 4 (four) hours as needed for severe pain.   rosuvastatin 10 MG tablet Commonly known as: Crestor Take 1 tablet (10 mg total) by mouth daily.        Disposition: home   Final Dx: extension of fusion T10-L1  Discharge Instructions      Remove dressing in 72 hours   Complete by: As directed    Call MD for:  difficulty breathing, headache or visual disturbances   Complete by: As directed    Call MD for:  hives   Complete by: As directed    Call MD for:  persistant dizziness or light-headedness   Complete by:  As directed    Call MD for:  persistant nausea and vomiting   Complete by: As directed    Call MD for:  redness, tenderness, or signs of infection (pain, swelling, redness, odor or green/yellow discharge around incision site)   Complete by: As directed    Call MD for:  severe uncontrolled pain   Complete by: As directed    Call MD for:  temperature >100.4   Complete by: As directed    Diet - low sodium heart healthy   Complete by: As directed    Driving Restrictions   Complete by: As directed    No driving for 2 weeks, no riding in the car for 1 week   Increase activity slowly   Complete by: As directed    Lifting restrictions   Complete by: As directed    No lifting more than 8 lbs          Signed: Ocie Cornfield Deasiah Pace 09/18/2022, 7:36 AM

## 2022-09-18 NOTE — Plan of Care (Signed)
  Problem: Education: Goal: Ability to verbalize activity precautions or restrictions will improve Outcome: Completed/Met Goal: Knowledge of the prescribed therapeutic regimen will improve Outcome: Completed/Met Goal: Understanding of discharge needs will improve Outcome: Completed/Met   Problem: Activity: Goal: Ability to avoid complications of mobility impairment will improve Outcome: Completed/Met Goal: Ability to tolerate increased activity will improve Outcome: Completed/Met Goal: Will remain free from falls Outcome: Completed/Met   Problem: Bowel/Gastric: Goal: Gastrointestinal status for postoperative course will improve Outcome: Completed/Met   Problem: Clinical Measurements: Goal: Ability to maintain clinical measurements within normal limits will improve Outcome: Completed/Met Goal: Postoperative complications will be avoided or minimized Outcome: Completed/Met Goal: Diagnostic test results will improve Outcome: Completed/Met   Problem: Pain Management: Goal: Pain level will decrease Outcome: Completed/Met   Problem: Skin Integrity: Goal: Will show signs of wound healing Outcome: Completed/Met   Problem: Health Behavior/Discharge Planning: Goal: Identification of resources available to assist in meeting health care needs will improve Outcome: Completed/Met   Problem: Bladder/Genitourinary: Goal: Urinary functional status for postoperative course will improve Outcome: Completed/Met Patient alert and oriented, void, ambulate, drain removed surgical site clean dry. D/c instructions explain and given, all questions answered. Pt. D/c  home per order.

## 2022-09-18 NOTE — Evaluation (Signed)
Occupational Therapy Evaluation Patient Details Name: Janice Pace MRN: [id number] DOB: Nov 17, 1945 Today's Date: 09/18/2022   History of Present Illness Pt is a 77 y.o. female with segmental degeneration above her previous fusion from L1-S1. Now s/p thoracic 10-L1 posterior thoracolumbar fusion with instrumentation 3/6.   PMHx significant for several back surgeries including previous L4-S1 fusion in 2013 and lumbago, COPD, GERD, heart murmur, and pre-diabetes.   Clinical Impression   PTA, pt lived with her husband and was independent. Pt reporting husband is attentive to detail and will assist her in the home setting as needed. Upon eval, pt performing at supervision level. Pt educated and demonstrating use of compensatory techniques for LB ADL, grooming, toileting, shower transfers, and brace application within precautions. Recommending discharge home with family to assist as needed. OT to sign off, thank you for this order.      Recommendations for follow up therapy are one component of a multi-disciplinary discharge planning process, led by the attending physician.  Recommendations may be updated based on patient status, additional functional criteria and insurance authorization.   Follow Up Recommendations  No OT follow up     Assistance Recommended at Discharge Intermittent Supervision/Assistance  Patient can return home with the following A little help with walking and/or transfers;A little help with bathing/dressing/bathroom;Assistance with cooking/housework;Assist for transportation;Help with stairs or ramp for entrance    Functional Status Assessment  Patient has had a recent decline in their functional status and demonstrates the ability to make significant improvements in function in a reasonable and predictable amount of time.  Equipment Recommendations  None recommended by OT    Recommendations for Other Services       Precautions / Restrictions Precautions Precautions:  Back Precaution Booklet Issued: Yes (comment) Precaution Comments: all precautions reviewed within the context of ADL Required Braces or Orthoses: Spinal Brace Spinal Brace: Thoracolumbosacral orthotic (with leg support) Restrictions Weight Bearing Restrictions: No      Mobility Bed Mobility               General bed mobility comments: EOB on arrival and in chair on departure    Transfers Overall transfer level: Needs assistance Equipment used: Rolling walker (2 wheels) Transfers: Sit to/from Stand Sit to Stand: Supervision           General transfer comment: 1-2 bouts of difficulty problem solving (hand placement) prior to rise, but overall distant supervision for safety      Balance Overall balance assessment: Mild deficits observed, not formally tested                                         ADL either performed or assessed with clinical judgement   ADL Overall ADL's : Needs assistance/impaired Eating/Feeding: Independent   Grooming: Supervision/safety;Standing   Upper Body Bathing: Set up;Sitting   Lower Body Bathing: Set up;Supervison/ safety;Sit to/from stand   Upper Body Dressing : Maximal assistance;Standing Upper Body Dressing Details (indicate cue type and reason): Able to don clothing with set-up A, but max A to don brace Lower Body Dressing: Supervision/safety;Sit to/from stand   Toilet Transfer: Supervision/safety;Ambulation;Rolling walker (2 wheels);Regular Toilet   Toileting- Clothing Manipulation and Hygiene: Set up;Sit to/from stand Toileting - Clothing Manipulation Details (indicate cue type and reason): bidet at home Tub/ Shower Transfer: Walk-in shower;Min guard;Ambulation;Rolling walker (2 wheels) Tub/Shower Transfer Details (indicate cue type and reason): Pt reports she  uses 3 in 1 in shower Functional mobility during ADLs: Supervision/safety;Rolling walker (2 wheels) General ADL Comments: supervision for safety  approaching mod I for functional mobiltiy     Vision Baseline Vision/History: 0 No visual deficits Ability to See in Adequate Light: 0 Adequate Patient Visual Report: No change from baseline Vision Assessment?: No apparent visual deficits     Perception     Praxis      Pertinent Vitals/Pain Pain Assessment Pain Assessment: Faces Faces Pain Scale: Hurts a little bit Pain Location: back Pain Descriptors / Indicators: Discomfort Pain Intervention(s): Limited activity within patient's tolerance, Monitored during session     Hand Dominance Right   Extremity/Trunk Assessment Upper Extremity Assessment Upper Extremity Assessment: Overall WFL for tasks assessed   Lower Extremity Assessment Lower Extremity Assessment: Defer to PT evaluation   Cervical / Trunk Assessment Cervical / Trunk Assessment: Back Surgery   Communication Communication Communication: No difficulties   Cognition Arousal/Alertness: Awake/alert Behavior During Therapy: WFL for tasks assessed/performed Overall Cognitive Status: Within Functional Limits for tasks assessed                                 General Comments: Able to recall all precautions and using compensatory strategies without cues     General Comments  VSS, daughter present    Exercises     Shoulder Instructions      Home Living Family/patient expects to be discharged to:: Private residence Living Arrangements: Spouse/significant other Available Help at Discharge: Family;Available 24 hours/day Type of Home: House Home Access: Stairs to enter [employer] of Steps: 1 step then small threshold Entrance Stairs-Rails: Left Home Layout: One level     Bathroom Shower/Tub: Occupational psychologist: Standard     Home Equipment: BSC/3in1;Other (comment) (bidet)          Prior Functioning/Environment Prior Level of Function : Independent/Modified Independent;Driving               ADLs  Comments: Indep in ADL and IADL:        OT Problem List: Decreased strength;Impaired balance (sitting and/or standing);Decreased activity tolerance;Decreased knowledge of use of DME or AE;Decreased knowledge of precautions      OT Treatment/Interventions:      OT Goals(Current goals can be found in the care plan section) Acute Rehab OT Goals Patient Stated Goal: No more surgeries OT Goal Formulation: With patient/family Time For Goal Achievement: 10/02/22 Potential to Achieve Goals: Good  OT Frequency:      Co-evaluation              AM-PAC OT "6 Clicks" Daily Activity     Outcome Measure Help from another person eating meals?: None Help from another person taking care of personal grooming?: A Little Help from another person toileting, which includes using toliet, bedpan, or urinal?: A Little Help from another person bathing (including washing, rinsing, drying)?: A Little Help from another person to put on and taking off regular upper body clothing?: A Lot Help from another person to put on and taking off regular lower body clothing?: A Little 6 Click Score: 18   End of Session Equipment Utilized During Treatment: Gait belt;Rolling walker (2 wheels);Back brace Nurse Communication: Mobility status  Activity Tolerance: Patient tolerated treatment well Patient left: in chair;with call bell/phone within reach;with family/visitor present  OT Visit Diagnosis: Unsteadiness on feet (R26.81);Muscle weakness (generalized) (M62.81)  [id number] OT Time Calculation (min): 30 min Charges:  OT General Charges $OT Visit: 1 Visit OT Evaluation $OT Eval Low Complexity: 1 Low OT Treatments $Self Care/Home Management : 8-22 mins  Elder Cyphers, OTR/L [facility] Acute Rehabilitation Office: [phone]   Magnus Ivan 09/18/2022, 9:36 AM

## 2022-09-18 NOTE — Evaluation (Signed)
Physical Therapy Evaluation  Patient Details Name: Janice Pace MRN: [id number] DOB: 1945-08-14 Today's Date: 09/18/2022  History of Present Illness  Pt is a 77 y.o. female with segmental degeneration above her previous fusion from L1-S1. Now s/p thoracic 10-L1 posterior thoracolumbar fusion with instrumentation 3/6.   PMHx significant for several back surgeries including previous L4-S1 fusion in 2013 and lumbago, COPD, GERD, heart murmur, and pre-diabetes.   Clinical Impression  Pt admitted with above diagnosis. At the time of PT eval, pt was able to demonstrate transfers and ambulation with gross min guard assist to min assist and RW for support. Clamshell brace adjusted for optimal fit. Pt was educated on precautions, brace application/wearing schedule, appropriate activity progression, and car transfer. Pt currently with functional limitations due to the deficits listed below (see PT Problem List). Pt will benefit from skilled PT to increase their independence and safety with mobility to allow discharge to the venue listed below.         Recommendations for follow up therapy are one component of a multi-disciplinary discharge planning process, led by the attending physician.  Recommendations may be updated based on patient status, additional functional criteria and insurance authorization.  Follow Up Recommendations No PT follow up      Assistance Recommended at Discharge PRN  Patient can return home with the following  A little help with walking and/or transfers;A little help with bathing/dressing/bathroom;Assistance with cooking/housework;Assist for transportation;Help with stairs or ramp for entrance    Equipment Recommendations Rolling walker (2 wheels)  Recommendations for Other Services       Functional Status Assessment Patient has had a recent decline in their functional status and demonstrates the ability to make significant improvements in function in a reasonable and  predictable amount of time.     Precautions / Restrictions Precautions Precautions: Back Precaution Booklet Issued: Yes (comment) Precaution Comments: all precautions reviewed within the context of ADL Required Braces or Orthoses: Spinal Brace Spinal Brace: Thoracolumbosacral orthotic;Applied in sitting position (Clamshell with leg component) Restrictions Weight Bearing Restrictions: No      Mobility  Bed Mobility               General bed mobility comments: Pt sitting in chair upon arrival. Verbally reviewed log roll technique.    Transfers Overall transfer level: Needs assistance Equipment used: Rolling walker (2 wheels) Transfers: Sit to/from Stand Sit to Stand: Min assist           General transfer comment: Assist to rise from chair without arm rests. VC's for improved posture and to keep from pulling up from the RW to prevent it from tipping back on her.    Ambulation/Gait Ambulation/Gait assistance: Min assist Gait Distance (Feet): 300 Feet Assistive device: Rolling walker (2 wheels) Gait Pattern/deviations: Step-through pattern, Decreased stride length, Trunk flexed, Antalgic Gait velocity: Decreased Gait velocity interpretation: <1.31 ft/sec, indicative of household ambulator   General Gait Details: Slow and guarded but generally steady with RW for support. Mildly antalgic due to leg component of brace. No knee buckling noted and no overt LOB.  Stairs Stairs: Yes Stairs assistance: Min guard Stair Management: One rail Left, Step to pattern, Forwards Number of Stairs: 1 General stair comments: VC's for sequencing and general safety with negotiating a step with leg component of brace locked.  Wheelchair Mobility    Modified Rankin (Stroke Patients Only)       Balance Overall balance assessment: Mild deficits observed, not formally tested  Pertinent Vitals/Pain Pain Assessment Pain  Assessment: Faces Faces Pain Scale: Hurts a little bit Pain Location: back Pain Descriptors / Indicators: Discomfort Pain Intervention(s): Limited activity within patient's tolerance, Monitored during session, Repositioned    Home Living Family/patient expects to be discharged to:: Private residence Living Arrangements: Spouse/significant other Available Help at Discharge: Family;Available 24 hours/day Type of Home: House Home Access: Stairs to enter Entrance Stairs-Rails: Left Entrance Stairs-Number of Steps: 1 step then small threshold   Home Layout: One level Home Equipment: BSC/3in1;Other (comment) (bidet)      Prior Function Prior Level of Function : Independent/Modified Independent;Driving               ADLs Comments: Indep in ADL and IADL:     Hand Dominance   Dominant Hand: Right    Extremity/Trunk Assessment   Upper Extremity Assessment Upper Extremity Assessment: Overall WFL for tasks assessed    Lower Extremity Assessment Lower Extremity Assessment: Generalized weakness    Cervical / Trunk Assessment Cervical / Trunk Assessment: Back Surgery  Communication   Communication: No difficulties  Cognition Arousal/Alertness: Awake/alert Behavior During Therapy: WFL for tasks assessed/performed Overall Cognitive Status: Within Functional Limits for tasks assessed                                          General Comments General comments (skin integrity, edema, etc.): VSS, daughter present    Exercises     Assessment/Plan    PT Assessment Patient needs continued PT services  PT Problem List Decreased strength;Decreased activity tolerance;Decreased balance;Decreased mobility;Decreased knowledge of use of DME;Decreased safety awareness;Decreased knowledge of precautions;Pain       PT Treatment Interventions DME instruction;Gait training;Stair training;Functional mobility training;Therapeutic activities;Therapeutic exercise;Balance  training;Patient/family education    PT Goals (Current goals can be found in the Care Plan section)  Acute Rehab PT Goals Patient Stated Goal: Home today PT Goal Formulation: With patient/family Time For Goal Achievement: 09/25/22 Potential to Achieve Goals: Good    Frequency Min 5X/week     Co-evaluation               AM-PAC PT "6 Clicks" Mobility  Outcome Measure Help needed turning from your back to your side while in a flat bed without using bedrails?: A Little Help needed moving from lying on your back to sitting on the side of a flat bed without using bedrails?: A Little Help needed moving to and from a bed to a chair (including a wheelchair)?: A Little Help needed standing up from a chair using your arms (e.g., wheelchair or bedside chair)?: A Little Help needed to walk in hospital room?: A Little Help needed climbing 3-5 steps with a railing? : A Little 6 Click Score: 18    End of Session Equipment Utilized During Treatment: Gait belt;Back brace Activity Tolerance: Patient tolerated treatment well Patient left: with call bell/phone within reach;with family/visitor present;in bed (Sitting EOB) Nurse Communication: Mobility status PT Visit Diagnosis: Unsteadiness on feet (R26.81);Pain Pain - part of body:  (back)    Time: [id number] PT Time Calculation (min) (ACUTE ONLY): 31 min   Charges:   PT Evaluation $PT Eval Low Complexity: 1 Low PT Treatments $Gait Training: 8-22 mins        Rolinda Roan, PT, DPT Acute Rehabilitation Services Secure Chat Preferred Office: [phone]   Thelma Comp 09/18/2022, 10:41 AM

## 2022-09-24 ENCOUNTER — Encounter: Payer: Self-pay | Admitting: Internal Medicine

## 2022-09-25 MED FILL — Sodium Chloride IV Soln 0.9%: INTRAVENOUS | Qty: 1000 | Status: AC

## 2022-09-25 MED FILL — Heparin Sodium (Porcine) Inj 1000 Unit/ML: INTRAMUSCULAR | Qty: 30 | Status: AC

## 2022-09-26 ENCOUNTER — Ambulatory Visit: Payer: Medicare Other | Admitting: Family Medicine

## 2022-09-26 NOTE — Progress Notes (Deleted)
   Established Patient Office Visit  Subjective   Patient ID: Janice Pace, female    DOB: 03/12/1946  Age: 77 y.o. MRN: [id number]  No chief complaint on file.   HPI Janice Pace is a 77 y.o. female presenting today for follow up of HTN, HLD, prediabetes, mood. Hypertension: Patient here for follow-up of elevated blood pressure. She {is/is not:9024} exercising and {is/is not:9024} adherent to low salt diet.   Pt denies chest pain, SOB, dizziness, edema, syncope, fatigue or heart palpitations. Taking metoprolol, reports {excellent/good/fair/poor:19665} compliance with treatment. Denies side effects. Hyperlipidemia: tolerating rosuvastatin well with no myalgias or significant side effects. Currently consuming a {diet types:17450} diet. {types:19826} The ASCVD Risk score (Arnett DK, et al., 2019) failed to calculate for the following reasons:   The valid total cholesterol range is 130 to 320 mg/dL Prediabetes: denies hypoglycemic events, wounds or sores that are not healing well, increased thirst or urination. Has been following *** diet and *** for exercise. Mood: Patient is here to follow up for ***. she is currently managing with ***. Taking medication without side effects, reports {excellent/good/fair/poor:19665} compliance with treatment. Denies mood changes or SI/HI. she feels mood is {improved/worse/stable:29130} since last visit. Denies chest pain, difficulty concentrating, dizziness, fatigue, insomnia, irritability, palpitations, panic attacks, racing thoughts, SOB, sweating. Denies anhedonia, depressed mood, difficulty concentrating, fatigue, feelings of worthlessness/guilt, hopelessness, hypersomnia, impaired memory, insomnia, psychomotor agitation, psychomotor retardation, recurrent thoughts of death, weight changes.     2022/06/23    8:52 AM 05/20/2022   11:23 AM 11/25/2021    8:57 AM  Depression screen PHQ 2/9  Decreased Interest 0 0 0  Down, Depressed, Hopeless 0 0 0  PHQ - 2  Score 0 0 0  Altered sleeping 0 0 0  Tired, decreased energy 0 0 0  Change in appetite 0 0 0  Feeling bad or failure about yourself  0 0 0  Trouble concentrating 0 0 0  Moving slowly or fidgety/restless 0 0 0  Suicidal thoughts 0 0 0  PHQ-9 Score 0 0 0  Difficult doing work/chores  Not difficult at all        Jun 23, 2022    8:53 AM 05/20/2022   11:23 AM 11/25/2021    8:57 AM 10/01/2021    8:10 AM  GAD 7 : Generalized Anxiety Score  Nervous, Anxious, on Edge 0 0 0 0  Control/stop worrying 0 0 0 0  Worry too much - different things 0 0 0 0  Trouble relaxing 0 0 0 0  Restless 0 0 0 0  Easily annoyed or irritable 0 0 0 0  Afraid - awful might happen 0 0 0 0  Total GAD 7 Score 0 0 0 0  Anxiety Difficulty  Not difficult at all  Not difficult at all     ROS Negative unless otherwise noted in HPI   Objective:     There were no vitals taken for this visit.  Physical Exam   No results found for any visits on 09/26/22.  {Labs (Optional):23779}  The ASCVD Risk score (Arnett DK, et al., 2019) failed to calculate for the following reasons:   The valid total cholesterol range is 130 to 320 mg/dL    Assessment & Plan:  There are no diagnoses linked to this encounter.  No follow-ups on file.    Velva Harman, PA

## 2022-10-15 ENCOUNTER — Ambulatory Visit (INDEPENDENT_AMBULATORY_CARE_PROVIDER_SITE_OTHER): Payer: Medicare Other | Admitting: Family Medicine

## 2022-10-15 ENCOUNTER — Encounter: Payer: Self-pay | Admitting: Family Medicine

## 2022-10-15 VITALS — BP 144/83 | HR 79 | Resp 18 | Ht 62.0 in | Wt 200.0 lb

## 2022-10-15 DIAGNOSIS — F411 Generalized anxiety disorder: Secondary | ICD-10-CM

## 2022-10-15 DIAGNOSIS — R61 Generalized hyperhidrosis: Secondary | ICD-10-CM

## 2022-10-15 DIAGNOSIS — D3502 Benign neoplasm of left adrenal gland: Secondary | ICD-10-CM | POA: Diagnosis not present

## 2022-10-15 DIAGNOSIS — R7303 Prediabetes: Secondary | ICD-10-CM | POA: Diagnosis not present

## 2022-10-15 DIAGNOSIS — R232 Flushing: Secondary | ICD-10-CM | POA: Diagnosis not present

## 2022-10-15 DIAGNOSIS — E782 Mixed hyperlipidemia: Secondary | ICD-10-CM

## 2022-10-15 DIAGNOSIS — D3501 Benign neoplasm of right adrenal gland: Secondary | ICD-10-CM

## 2022-10-15 DIAGNOSIS — N951 Menopausal and female climacteric states: Secondary | ICD-10-CM

## 2022-10-15 DIAGNOSIS — I10 Essential (primary) hypertension: Secondary | ICD-10-CM | POA: Diagnosis not present

## 2022-10-15 NOTE — Assessment & Plan Note (Signed)
Blood pressure elevated in office 144/83, remained elevated on repeat 139/82.  Patient states that she has been experiencing episodes of diaphoresis and flushing for approximately a year now.  This does coincide with the time frame in which she started metoprolol which can sometimes cause this type of symptom.  I will advise patient to contact her cardiologist to schedule an appointment to discuss changing her current medication in case that is what is causing her symptoms.

## 2022-10-15 NOTE — Patient Instructions (Signed)
We will check some hormone levels at your next visit when we get your routine lab work as well.  I will look through your medications today and see if there is a new that may be related to the flushing and sweating.

## 2022-10-15 NOTE — Assessment & Plan Note (Signed)
Patient has a history of menopausal vasomotor syndrome for which hormone replacement therapy in the past was helpful.  Patient stated that the most recent episodes of sweating and flushing that began a little over a year ago were also treated with HRT but this was not as successful.  She is not currently using HRT but is still experiencing daily episodes of sweating and flushing.  I do believe at this point it is worthwhile to investigate other causes including ruling out medication side effects and any potential changes in her bilateral adrenal adenomas.

## 2022-10-15 NOTE — Progress Notes (Signed)
Established Patient Office Visit  Subjective   Patient ID: Janice Pace, female    DOB: [birth year]  Age: 77 y.o. MRN: [id number]  Chief Complaint  Patient presents with   Anxiety   Depression   Hypertension    HPI Janice Pace is a 77 y.o. female presenting today for follow up of hypertension, hyperlipidemia, anxiety.  She is also complaining of episodes of diaphoresis and flushing that have been happening daily for about a year.  She typically showers first thing in the morning before getting ready.  While she is getting ready, she finds that her head and hair become "drenched in sweat".  She denies sensation of being warm, it feels different than a hot flash does.  Denies chest pain, headache, shortness of breath, nausea, vomiting, dizziness, palpitations.  Flushing is the only symptom that occurs at the same time as the episodes of diaphoresis.  Patient has had menopausal vasomotor symptoms in the past, and prior episodes have been successfully treated with hormone replacement therapy.  These most recent episodes of diaphoresis and flushing were likewise treated with hormone replacement therapy, but patient states that this was not fully effective. Hypertension: Patient here for follow-up of elevated blood pressure.   Pt denies chest pain, SOB, dizziness, edema, syncope, fatigue or heart palpitations. Taking metoprolol, reports excellent compliance with treatment. Denies side effects. Hyperlipidemia: tolerating rosuvastatin well with no myalgias or significant side effects. Currently consuming a  low-carb  diet.  The ASCVD Risk score (Arnett DK, et al., 2019) failed to calculate for the following reasons:   The valid total cholesterol range is 130 to 320 mg/dL Mood: Patient is here to follow up for anxiety, currently managing with Librium.  Denies side effects from medication.  Denies mood changes or SI/HI. she feels mood is stable since last visit. Denies chest pain, difficulty  concentrating, dizziness, fatigue, insomnia, irritability, palpitations, panic attacks, racing thoughts, SOB. Denies anhedonia, depressed mood, difficulty concentrating, fatigue, feelings of worthlessness/guilt, hopelessness, hypersomnia, impaired memory, insomnia, psychomotor agitation, psychomotor retardation, recurrent thoughts of death, weight changes.     11-04-2022   11:25 AM 05/28/2022    8:52 AM 05/20/2022   11:23 AM  Depression screen PHQ 2/9  Decreased Interest 0 0 0  Down, Depressed, Hopeless 0 0 0  PHQ - 2 Score 0 0 0  Altered sleeping 2 0 0  Tired, decreased energy 0 0 0  Change in appetite 0 0 0  Feeling bad or failure about yourself  0 0 0  Trouble concentrating 0 0 0  Moving slowly or fidgety/restless 0 0 0  Suicidal thoughts 0 0 0  PHQ-9 Score 2 0 0  Difficult doing work/chores Not difficult at all  Not difficult at all       Nov 04, 2022   11:26 AM 05/28/2022    8:53 AM 05/20/2022   11:23 AM 11/25/2021    8:57 AM  GAD 7 : Generalized Anxiety Score  Nervous, Anxious, on Edge 0 0 0 0  Control/stop worrying 0 0 0 0  Worry too much - different things 0 0 0 0  Trouble relaxing 0 0 0 0  Restless 0 0 0 0  Easily annoyed or irritable 0 0 0 0  Afraid - awful might happen 0 0 0 0  Total GAD 7 Score 0 0 0 0  Anxiety Difficulty Not difficult at all  Not difficult at all    ROS Negative unless otherwise noted in HPI   Objective:  BP (!) 144/83 (BP Location: Left Arm, Patient Position: Sitting, Cuff Size: Normal)   Pulse 79   Resp 18   Ht 5\' 2"  (1.575 m)   Wt 200 lb (90.7 kg)   SpO2 98%   BMI 36.58 kg/m   Physical Exam Constitutional:      General: She is not in acute distress.    Appearance: Normal appearance.     Comments: Wearing back brace  HENT:     Head: Normocephalic and atraumatic.  Cardiovascular:     Rate and Rhythm: Normal rate and regular rhythm.     Heart sounds: Normal heart sounds. No murmur heard.    No friction rub. No gallop.   Pulmonary:     Effort: Pulmonary effort is normal. No respiratory distress.     Breath sounds: No wheezing, rhonchi or rales.  Skin:    General: Skin is warm and dry.  Neurological:     Mental Status: She is alert and oriented to person, place, and time.     Assessment & Plan:  Primary hypertension Assessment & Plan: Blood pressure elevated in office 144/83, remained elevated on repeat 139/82.  Patient states that she has been experiencing episodes of diaphoresis and flushing for approximately a year now.  This does coincide with the time frame in which she started metoprolol which can sometimes cause this type of symptom.  I will advise patient to contact her cardiologist to schedule an appointment to discuss changing her current medication in case that is what is causing her symptoms.   Generalized anxiety disorder Assessment & Plan: PHQ-9 score of 2, GAD-7 score of 0.  Patient states that her mood is stable and she would like to continue with current regimen.  Continue chlordiazepoxide 10 mg as needed.  Will continue to monitor.   Mixed hyperlipidemia Assessment & Plan: Continue rosuvastatin 10 mg daily.  Will repeat lipid panel with fasting blood work before next appointment.  Will continue to monitor.   Bilateral adrenal adenomas Assessment & Plan: First identified around 2016 on CT.  Most recent CT imaging in 2022 revealed stable size of bilateral adrenal.  From what I can see in review of her chart, initial findings were incidental.  It is possible that these may be contributing to her recent symptoms if not due to medication or menopausal vasomotor symptoms.   Prediabetes Assessment & Plan: Last A1c 5.7.  Will repeat A1c at next appointment.  Will continue to monitor.   Menopausal vasomotor syndrome Assessment & Plan: Patient has a history of menopausal vasomotor syndrome for which hormone replacement therapy in the past was helpful.  Patient stated that the most recent  episodes of sweating and flushing that began a little over a year ago were also treated with HRT but this was not as successful.  She is not currently using HRT but is still experiencing daily episodes of sweating and flushing.  I do believe at this point it is worthwhile to investigate other causes including ruling out medication side effects and any potential changes in her bilateral adrenal adenomas.     Return in about 4 months (around 02/14/2023) for follow-up, fasting blood work 1 week before.  Orders for labs have already been submitted.  I spent 70 minutes on the day of the encounter to include pre-visit record review, face-to-face time with the patient and post visit ordering of test.  Velva Harman, PA

## 2022-10-15 NOTE — Assessment & Plan Note (Signed)
First identified around 2016 on CT.  Most recent CT imaging in 2022 revealed stable size of bilateral adrenal.  From what I can see in review of her chart, initial findings were incidental.  It is possible that these may be contributing to her recent symptoms if not due to medication or menopausal vasomotor symptoms.

## 2022-10-15 NOTE — Assessment & Plan Note (Addendum)
PHQ-9 score of 2, GAD-7 score of 0.  Patient states that her mood is stable and she would like to continue with current regimen.  Continue chlordiazepoxide 10 mg as needed.  Will continue to monitor.

## 2022-10-15 NOTE — Assessment & Plan Note (Signed)
Last A1c 5.7.  Will repeat A1c at next appointment.  Will continue to monitor.

## 2022-10-15 NOTE — Assessment & Plan Note (Signed)
Continue rosuvastatin 10 mg daily.  Will repeat lipid panel with fasting blood work before next appointment.  Will continue to monitor.

## 2022-10-20 ENCOUNTER — Encounter: Payer: Self-pay | Admitting: Internal Medicine

## 2022-10-21 DIAGNOSIS — M47814 Spondylosis without myelopathy or radiculopathy, thoracic region: Secondary | ICD-10-CM | POA: Diagnosis not present

## 2022-10-27 ENCOUNTER — Encounter: Payer: Medicare Other | Admitting: Internal Medicine

## 2022-10-28 LAB — HM DEXA SCAN

## 2022-10-29 ENCOUNTER — Telehealth: Payer: Self-pay | Admitting: Sports Medicine

## 2022-10-29 DIAGNOSIS — M810 Age-related osteoporosis without current pathological fracture: Secondary | ICD-10-CM

## 2022-10-29 NOTE — Telephone Encounter (Signed)
I received a fax from [facility] regarding Janice Pace's bone density test, it did show osteoporosis with a T-score of -3.2.  This will be scanned into the medical record, I have never seen this patient and did not order this test, she will need to follow-up with her PCP for treatment discussion, although my recommendation would be Evenity for a year followed by Prolia. 

## 2022-10-29 NOTE — Assessment & Plan Note (Signed)
I received a fax from [facility] regarding Janice Pace's bone density test, it did show osteoporosis with a T-score of -3.2.  This will be scanned into the medical record, I have never seen this patient and did not order this test, she will need to follow-up with her PCP for treatment discussion, although my recommendation would be Evenity for a year followed by Prolia.

## 2022-10-30 ENCOUNTER — Encounter: Payer: Self-pay | Admitting: Internal Medicine

## 2022-10-30 ENCOUNTER — Ambulatory Visit (AMBULATORY_SURGERY_CENTER): Payer: Medicare Other | Admitting: *Deleted

## 2022-10-30 VITALS — Ht 62.0 in | Wt 195.0 lb

## 2022-10-30 DIAGNOSIS — Z1211 Encounter for screening for malignant neoplasm of colon: Secondary | ICD-10-CM

## 2022-10-30 MED ORDER — NA SULFATE-K SULFATE-MG SULF 17.5-3.13-1.6 GM/177ML PO SOLN: 1.0000 | Freq: Once | ORAL | 0 refills | Status: AC

## 2022-10-30 NOTE — Progress Notes (Signed)
Pt's pre-visit is done over the phone.. Pt's name and DOB verified at the beginning of the pre-visit. Pt denies any difficulty with ambulating.  No egg or soy allergy known to patient  No issues known to pt with past sedation with any surgeries or procedures Pt denies having issues being intubated Pt has no issues moving head neck or swallowing No FH of Malignant Hyperthermia Pt is not on diet pills Pt is not on home 02  Pt is not on blood thinners  Pt has frequent issues with constipation. Uses Colace and keep her "regular" Pt is not on dialysis Pt denies any upcoming cardiac testing Pt encouraged to use to use Singlecare or Goodrx to reduce cost  Patient's chart reviewed by Cathlyn Parsons CNRA prior to pre-visit and patient appropriate for the [facility].  Pre-visit completed and red dot placed by patient's name on their procedure day (on provider's schedule).  . Visit by phone Pt states  weight is 195 lb Instructions reviewed with pt and pt states understanding. Instructed to review again prior to procedure. Pt states they will.  Instructions sent by mail with coupon and by my chart Pt still has brace from back surgery but has no issues with ambulation and sitting or laying.

## 2022-11-19 ENCOUNTER — Ambulatory Visit
Admission: RE | Admit: 2022-11-19 | Discharge: 2022-11-19 | Disposition: A | Discharge status: Home / Self Care | Payer: Medicare Other | Source: Ambulatory Visit | Attending: Family Medicine | Admitting: Family Medicine

## 2022-11-19 DIAGNOSIS — R61 Generalized hyperhidrosis: Secondary | ICD-10-CM

## 2022-11-19 DIAGNOSIS — R232 Flushing: Secondary | ICD-10-CM | POA: Diagnosis not present

## 2022-11-19 DIAGNOSIS — K760 Fatty (change of) liver, not elsewhere classified: Secondary | ICD-10-CM | POA: Diagnosis not present

## 2022-11-19 DIAGNOSIS — I700 Atherosclerosis of aorta: Secondary | ICD-10-CM | POA: Diagnosis not present

## 2022-11-19 DIAGNOSIS — E279 Disorder of adrenal gland, unspecified: Secondary | ICD-10-CM | POA: Diagnosis not present

## 2022-11-20 DIAGNOSIS — M47814 Spondylosis without myelopathy or radiculopathy, thoracic region: Secondary | ICD-10-CM | POA: Diagnosis not present

## 2022-11-26 ENCOUNTER — Ambulatory Visit (AMBULATORY_SURGERY_CENTER): Payer: Medicare Other | Admitting: Internal Medicine

## 2022-11-26 ENCOUNTER — Encounter: Payer: Self-pay | Admitting: Internal Medicine

## 2022-11-26 VITALS — BP 141/58 | HR 71 | Temp 97.3°F | Resp 16 | Ht 62.0 in | Wt 195.0 lb

## 2022-11-26 DIAGNOSIS — D125 Benign neoplasm of sigmoid colon: Secondary | ICD-10-CM

## 2022-11-26 DIAGNOSIS — D122 Benign neoplasm of ascending colon: Secondary | ICD-10-CM

## 2022-11-26 DIAGNOSIS — Z1211 Encounter for screening for malignant neoplasm of colon: Secondary | ICD-10-CM

## 2022-11-26 DIAGNOSIS — J449 Chronic obstructive pulmonary disease, unspecified: Secondary | ICD-10-CM | POA: Diagnosis not present

## 2022-11-26 DIAGNOSIS — D123 Benign neoplasm of transverse colon: Secondary | ICD-10-CM

## 2022-11-26 DIAGNOSIS — R7303 Prediabetes: Secondary | ICD-10-CM | POA: Diagnosis not present

## 2022-11-26 DIAGNOSIS — D124 Benign neoplasm of descending colon: Secondary | ICD-10-CM

## 2022-11-26 MED ORDER — SODIUM CHLORIDE 0.9 % IV SOLN: 500.0000 mL | Freq: Once | INTRAVENOUS | Status: DC

## 2022-11-26 NOTE — Op Note (Signed)
[facility] Patient Name: Janice Pace Procedure Date: 11/26/2022 8:35 AM MRN: [id number] Endoscopist: Iva Boop , MD, [id number] Age: 77 Referring MD:  Date of Birth: [birth year] Gender: Female Account #: [id number] Procedure:                Colonoscopy Indications:              Screening for colorectal malignant neoplasm, Last                            colonoscopy: 2014 Medicines:                Monitored Anesthesia Care Procedure:                Pre-Anesthesia Assessment:                           - Prior to the procedure, a History and Physical                            was performed, and patient medications and                            allergies were reviewed. The patient's tolerance of                            previous anesthesia was also reviewed. The risks                            and benefits of the procedure and the sedation                            options and risks were discussed with the patient.                            All questions were answered, and informed consent                            was obtained. Prior Anticoagulants: The patient has                            taken no anticoagulant or antiplatelet agents. ASA                            Grade Assessment: III - A patient with severe                            systemic disease. After reviewing the risks and                            benefits, the patient was deemed in satisfactory                            condition to undergo the procedure.  After obtaining informed consent, the colonoscope                            was passed under direct vision. Throughout the                            procedure, the patient's blood pressure, pulse, and                            oxygen saturations were monitored continuously. The                            Olympus PCF-[id number] (#[id number]) Colonoscope was                            introduced through the anus and advanced  to the the                            cecum, identified by appendiceal orifice and                            ileocecal valve. The colonoscopy was performed                            without difficulty. The patient tolerated the                            procedure well. The quality of the bowel                            preparation was good. The bowel preparation used                            was SUPREP via split dose instruction. Scope In: 8:41:21 AM Scope Out: 9:02:58 AM Scope Withdrawal Time: 0 hours 17 minutes 35 seconds  Total Procedure Duration: 0 hours 21 minutes 37 seconds  Findings:                 Skin tags were found on perianal exam.                           Eight sessile polyps were found in the sigmoid                            colon, descending colon, transverse colon and                            ascending colon. The polyps were diminutive in                            size. These polyps were removed with a cold snare.                            Resection and retrieval were complete. Verification  of patient identification for the specimen was                            done. Estimated blood loss was minimal.                           Anal papilla(e) were hypertrophied.                           The exam was otherwise without abnormality on                            direct and retroflexion views. Complications:            No immediate complications. Estimated Blood Loss:     Estimated blood loss was minimal. Impression:               - Perianal skin tags found on perianal exam.                           - Eight diminutive polyps in the sigmoid colon, in                            the descending colon, in the transverse colon and                            in the ascending colon, removed with a cold snare.                            Resected and retrieved.                           - Anal papilla(e) were hypertrophied.                            - The examination was otherwise normal on direct                            and retroflexion views. Recommendation:           - Patient has a contact number available for                            emergencies. The signs and symptoms of potential                            delayed complications were discussed with the                            patient. Return to normal activities tomorrow.                            Written discharge instructions were provided to the                            patient.                           -  Resume previous diet.                           - Continue present medications.                           - No recommendation at this time regarding repeat                            colonoscopy due to age. Iva Boop, MD 11/26/2022 9:11:17 AM This report has been signed electronically.

## 2022-11-26 NOTE — Progress Notes (Signed)
Called to room to assist during endoscopic procedure.  Patient ID and intended procedure confirmed with present staff. Received instructions for my participation in the procedure from the performing physician.  

## 2022-11-26 NOTE — Progress Notes (Signed)
To pacu, VSS. Report to [email or web address].tb 

## 2022-11-26 NOTE — Patient Instructions (Addendum)
   I found and removed 8 tiny polyps that look benign.  I will let you know pathology results and when/if to have another routine colonoscopy by mail and/or My Chart.  I appreciate the opportunity to care for you. Iva Boop, MD, [facility]   HANDOUT ON POLYPS GIVEN TO YOU TODAY  AWAIT PATHOLOGY RESULTS ON POLYPS REMOVED     YOU HAD AN ENDOSCOPIC PROCEDURE TODAY AT THE [facility]:   Refer to the procedure report that was given to you for any specific questions about what was found during the examination.  If the procedure report does not answer your questions, please call your gastroenterologist to clarify.  If you requested that your care partner not be given the details of your procedure findings, then the procedure report has been included in a sealed envelope for you to review at your convenience later.  YOU SHOULD EXPECT: Some feelings of bloating in the abdomen. Passage of more gas than usual.  Walking can help get rid of the air that was put into your GI tract during the procedure and reduce the bloating. If you had a lower endoscopy (such as a colonoscopy or flexible sigmoidoscopy) you may notice spotting of blood in your stool or on the toilet paper. If you underwent a bowel prep for your procedure, you may not have a normal bowel movement for a few days.  Please Note:  You might notice some irritation and congestion in your nose or some drainage.  This is from the oxygen used during your procedure.  There is no need for concern and it should clear up in a day or so.  SYMPTOMS TO REPORT IMMEDIATELY:  Following lower endoscopy (colonoscopy or flexible sigmoidoscopy):  Excessive amounts of blood in the stool  Significant tenderness or worsening of abdominal pains  Swelling of the abdomen that is new, acute  Fever of 100F or higher   For urgent or emergent issues, a gastroenterologist can be reached at any hour by calling [phone]. Do not use MyChart messaging  for urgent concerns.    DIET:  We do recommend a small meal at first, but then you may proceed to your regular diet.  Drink plenty of fluids but you should avoid alcoholic beverages for 24 hours.  ACTIVITY:  You should plan to take it easy for the rest of today and you should NOT DRIVE or use heavy machinery until tomorrow (because of the sedation medicines used during the test).    FOLLOW UP: Our staff will call the number listed on your records the next business day following your procedure.  We will call around 7:15- 8:00 am to check on you and address any questions or concerns that you may have regarding the information given to you following your procedure. If we do not reach you, we will leave a message.     If any biopsies were taken you will be contacted by phone or by letter within the next 1-3 weeks.  Please call us at [phone] if you have not heard about the biopsies in 3 weeks.    SIGNATURES/CONFIDENTIALITY: You and/or your care partner have signed paperwork which will be entered into your electronic medical record.  These signatures attest to the fact that that the information above on your After Visit Summary has been reviewed and is understood.  Full responsibility of the confidentiality of this discharge information lies with you and/or your care-partner.

## 2022-11-26 NOTE — Progress Notes (Signed)
[facility] History and Physical   Primary Care Physician:  Melida Quitter, PA   Reason for Procedure:   CRCA screening  Plan:    colonoscopy     HPI: Janice Pace is a 77 y.o. female for repeat screening exam, negative colonoscopy 2014   Past Medical History:  Diagnosis Date   Arthritis    Asthma    Bronchitis    Cancer (HCC)    dermatosivros arcoma and protuverans   Chronic back pain    degenerative disc disease   Constipation    stool softener daily   Constipation    COPD (chronic obstructive pulmonary disease) (HCC)    GERD (gastroesophageal reflux disease)    takes Prilosec daily   GERD (gastroesophageal reflux disease)    Heart murmur    Heart valve disorder    Hemorrhoids    History of hiatal hernia    Insomnia    d/t chantix   Joint pain    Nocturia    Personal history of colonic adenoma 03/13/2003   03/13/2003 - 5 mm adenoma   Pre-diabetes    Prediabetes    White coat syndrome without diagnosis of hypertension     Past Surgical History:  Procedure Laterality Date   ABDOMINAL HYSTERECTOMY     ANTERIOR LAT LUMBAR FUSION N/A 02/27/2021   Procedure: Anterior lateral interbody fusion - lateral two - lateral three - lateral three - lateral four with exploration fusion L4-S1;  Surgeon: Donalee Citrin, MD;  Location: [facility] OR;  Service: Neurosurgery;  Laterality: N/A;   BACK SURGERY  1988   BACK SURGERY  09-03-11   rods,screws x 8    BUNIONECTOMY     bilateral   BUNIONECTOMY     bil feet   COLONOSCOPY     dermatosibrosarcoma protuberans     LAMINECTOMY WITH POSTERIOR LATERAL ARTHRODESIS LEVEL 3 N/A 02/27/2021   Procedure: Posterior augmentation with pedicle screws Latreal one - lateral four - Posterior Lateral and Interbody fusion;  Surgeon: Donalee Citrin, MD;  Location: [facility] OR;  Service: Neurosurgery;  Laterality: N/A;   PARTIAL HYSTERECTOMY     RECTOCELE REPAIR     Rectum Repair     RESECTION TUMOR WRIST RADICAL     x 2 rt   TUBAL LIGATION   1974   TUBAL LIGATION     WRIST SURGERY     left    Prior to Admission medications   Medication Sig Start Date End Date Taking? Authorizing Provider  albuterol (VENTOLIN HFA) 108 (90 Base) MCG/ACT inhaler Inhale 1-2 puffs into the lungs every 6 (six) hours as needed for wheezing or shortness of breath. 05/06/22  Yes Cobb, Ruby Cola, NP  ascorbic acid (VITAMIN C) 1000 MG tablet Take 1 tablet by mouth daily.   Yes [provider]  aspirin 81 MG EC tablet Take 1 tablet by mouth daily.   Yes [provider]  budesonide (PULMICORT) 0.5 MG/2ML nebulizer solution Take 0.5 mg by nebulization 2 (two) times daily. 07/30/22  Yes [provider]  chlordiazePOXIDE (LIBRIUM) 10 MG capsule Take 1 capsule (10 mg total) by mouth 2 (two) times daily as needed for anxiety. 05/28/22  Yes Boscia, Kathlynn Grate, NP  docusate sodium (COLACE) 100 MG capsule Take 500 mg by mouth at bedtime.   Yes [provider]  formoterol (PERFOROMIST) 20 MCG/2ML nebulizer solution Take 20 mcg by nebulization 2 (two) times daily. 07/30/22  Yes [provider]  gabapentin (NEURONTIN) 300 MG capsule  Take 1-2 capsules (300-600 mg total) by mouth 3 (three) times daily as needed (Nerve pain). Patient taking differently: Take 300 mg by mouth at bedtime. 02/15/21  Yes Rodolph Bong, MD  metoprolol succinate (TOPROL-XL) 50 MG 24 hr tablet Take 1 tablet (50 mg total) by mouth daily. Take with or immediately following a meal. 06/10/22 12/07/22 Yes Yates Decamp, MD  omeprazole (PRILOSEC) 20 MG capsule Take 1 capsule (20 mg total) by mouth daily before breakfast. Patient taking differently: Take 20 mg by mouth daily as needed (acid reflux). 05/28/22  Yes Boscia, Kathlynn Grate, NP  rosuvastatin (CRESTOR) 10 MG tablet Take 1 tablet (10 mg total) by mouth daily. 05/28/22  Yes Boscia, Kathlynn Grate, NP  acetaminophen (TYLENOL) 500 MG tablet Take 1,000 mg by mouth every 6 (six) hours as needed for moderate pain or headache.     [provider]    Current Outpatient Medications  Medication Sig Dispense Refill   albuterol (VENTOLIN HFA) 108 (90 Base) MCG/ACT inhaler Inhale 1-2 puffs into the lungs every 6 (six) hours as needed for wheezing or shortness of breath. 8 g 1   ascorbic acid (VITAMIN C) 1000 MG tablet Take 1 tablet by mouth daily.     aspirin 81 MG EC tablet Take 1 tablet by mouth daily.     budesonide (PULMICORT) 0.5 MG/2ML nebulizer solution Take 0.5 mg by nebulization 2 (two) times daily.     chlordiazePOXIDE (LIBRIUM) 10 MG capsule Take 1 capsule (10 mg total) by mouth 2 (two) times daily as needed for anxiety. 20 capsule 2   docusate sodium (COLACE) 100 MG capsule Take 500 mg by mouth at bedtime.     formoterol (PERFOROMIST) 20 MCG/2ML nebulizer solution Take 20 mcg by nebulization 2 (two) times daily.     gabapentin (NEURONTIN) 300 MG capsule Take 1-2 capsules (300-600 mg total) by mouth 3 (three) times daily as needed (Nerve pain). (Patient taking differently: Take 300 mg by mouth at bedtime.) 180 capsule 2   metoprolol succinate (TOPROL-XL) 50 MG 24 hr tablet Take 1 tablet (50 mg total) by mouth daily. Take with or immediately following a meal. 90 tablet 1   omeprazole (PRILOSEC) 20 MG capsule Take 1 capsule (20 mg total) by mouth daily before breakfast. (Patient taking differently: Take 20 mg by mouth daily as needed (acid reflux).) 90 capsule 1   rosuvastatin (CRESTOR) 10 MG tablet Take 1 tablet (10 mg total) by mouth daily. 90 tablet 1   acetaminophen (TYLENOL) 500 MG tablet Take 1,000 mg by mouth every 6 (six) hours as needed for moderate pain or headache.     Current Facility-Administered Medications  Medication Dose Route Frequency Provider Last Rate Last Admin   0.9 %  sodium chloride infusion  500 mL Intravenous Once Iva Boop, MD        Allergies as of 11/26/2022 - Review Complete 11/26/2022  Allergen Reaction Noted   Sulfa antibiotics Hives and Itching 10/03/2020    Atorvastatin Other (See Comments) 10/03/2020   Metformin hcl Other (See Comments) 10/03/2020    Family History  Problem Relation Age of Onset   Leukemia Mother    Anesthesia problems Neg Hx    Hypotension Neg Hx    Malignant hyperthermia Neg Hx    Pseudochol deficiency Neg Hx    Colon cancer Neg Hx    Colon polyps Neg Hx    Esophageal cancer Neg Hx    Stomach cancer Neg Hx    Rectal cancer  Neg Hx     Social History   Socioeconomic History   Marital status: Married    Spouse name: david   Number of children: 3   Years of education: Not on file   Highest education level: Not on file  Occupational History   Occupation: retired  Tobacco Use   Smoking status: Former    Packs/day: 1.00    Years: 50.00    Additional pack years: 0.00    Total pack years: 50.00    Types: Cigarettes    Quit date: 08/17/2011    Years since quitting: 11.2    Passive exposure: Never   Smokeless tobacco: Never  Vaping Use   Vaping Use: Never used  Substance and Sexual Activity   Alcohol use: No    Comment: occasional wine   Drug use: No   Sexual activity: Yes    Birth control/protection: Surgical, Other-see comments  Other Topics Concern   Not on file  Social History Narrative   ** Merged History Encounter **       Social Determinants of Health   Financial Resource Strain: Low Risk  (05/28/2022)   Overall Financial Resource Strain (CARDIA)    Difficulty of Paying Living Expenses: Not hard at all  Food Insecurity: No Food Insecurity (05/28/2022)   Hunger Vital Sign    Worried About Running Out of Food in the Last Year: Never true    Ran Out of Food in the Last Year: Never true  Transportation Needs: No Transportation Needs (05/28/2022)   PRAPARE - Administrator, Civil Service (Medical): No    Lack of Transportation (Non-Medical): No  Physical Activity: Inactive (05/28/2022)   Exercise Vital Sign    Days of Exercise per Week: 0 days    Minutes of Exercise per Session: 0  min  Stress: No Stress Concern Present (05/28/2022)   [employer] of Occupational Health - Occupational Stress Questionnaire    Feeling of Stress : Only a little  Social Connections: Socially Integrated (05/28/2022)   Social Connection and Isolation Panel [NHANES]    Frequency of Communication with Friends and Family: More than three times a week    Frequency of Social Gatherings with Friends and Family: Once a week    Attends Religious Services: 1 to 4 times per year    Active Member of [employer] or Organizations: Yes    Attends Banker Meetings: 1 to 4 times per year    Marital Status: Married  Catering manager Violence: Not At Risk (05/28/2022)   Humiliation, Afraid, Rape, and Kick questionnaire    Fear of Current or Ex-Partner: No    Emotionally Abused: No    Physically Abused: No    Sexually Abused: No    Review of Systems:  All other review of systems negative except as mentioned in the HPI.  Physical Exam: Vital signs BP 126/63 (BP Location: Left Arm, Patient Position: Sitting, Cuff Size: Normal)   Pulse 77   Temp (!) 97.3 F (36.3 C) (Temporal)   Ht 5\' 2"  (1.575 m)   Wt 195 lb (88.5 kg)   SpO2 96%   BMI 35.67 kg/m   General:   Alert,  Well-developed, well-nourished, pleasant and cooperative in NAD Lungs:  Clear throughout to auscultation.   Heart:  Regular rate and rhythm; no murmurs, clicks, rubs,  or gallops. Abdomen:  Soft, nontender and nondistended. Normal bowel sounds.   Neuro/Psych:  Alert and cooperative. Normal mood and affect. A and O  x 3   @Taylan Mayhan  Sena Slate, MD, [facility] [phone] (pager) 11/26/2022 8:33 AM@

## 2022-11-26 NOTE — Progress Notes (Signed)
Patient states that she doesn't need her back brace.  States that her doctor said that she only needs it when she drives.

## 2022-11-26 NOTE — Progress Notes (Signed)
Vitals-DT  Pt's states no medical or surgical changes since previsit or office visit.  

## 2022-11-27 ENCOUNTER — Telehealth: Payer: Self-pay

## 2022-11-27 NOTE — Telephone Encounter (Signed)
Patient returned call, states she is doing well, has no concerns or questions.

## 2022-11-27 NOTE — Telephone Encounter (Signed)
Attempted f/u call. No answer, left VM. 

## 2022-12-02 ENCOUNTER — Other Ambulatory Visit: Payer: Medicare Other

## 2022-12-02 DIAGNOSIS — E782 Mixed hyperlipidemia: Secondary | ICD-10-CM

## 2022-12-02 DIAGNOSIS — R7303 Prediabetes: Secondary | ICD-10-CM | POA: Diagnosis not present

## 2022-12-02 DIAGNOSIS — R61 Generalized hyperhidrosis: Secondary | ICD-10-CM

## 2022-12-02 DIAGNOSIS — I10 Essential (primary) hypertension: Secondary | ICD-10-CM | POA: Diagnosis not present

## 2022-12-02 DIAGNOSIS — R232 Flushing: Secondary | ICD-10-CM | POA: Diagnosis not present

## 2022-12-03 LAB — CBC WITH DIFFERENTIAL/PLATELET
Hemoglobin: 12.7 g/dL (ref 11.1–15.9)
Lymphs: 37 %
Monocytes Absolute: 0.3 10*3/uL (ref 0.1–0.9)
Neutrophils Absolute: 2 10*3/uL (ref 1.4–7.0)

## 2022-12-03 LAB — TSH: TSH: 2.05 u[IU]/mL (ref 0.450–4.500)

## 2022-12-03 LAB — LIPID PANEL: Triglycerides: 126 mg/dL (ref 0–149)

## 2022-12-04 ENCOUNTER — Encounter: Payer: Self-pay | Admitting: Internal Medicine

## 2022-12-10 LAB — CBC WITH DIFFERENTIAL/PLATELET
Basophils Absolute: 0 10*3/uL (ref 0.0–0.2)
Monocytes: 9 %

## 2022-12-10 LAB — LIPID PANEL
HDL: 33 mg/dL — ABNORMAL LOW (ref >39)
VLDL Cholesterol Cal: 23 mg/dL (ref 5–40)

## 2022-12-10 LAB — METANEPHRINES, PLASMA

## 2022-12-10 LAB — 5-HIAA, PLASMA

## 2022-12-13 LAB — CBC WITH DIFFERENTIAL/PLATELET
Basos: 1 %
Hematocrit: 38.5 % (ref 34.0–46.6)
Immature Grans (Abs): 0 10*3/uL (ref 0.0–0.1)
Lymphocytes Absolute: 1.5 10*3/uL (ref 0.7–3.1)
MCV: 88 fL (ref 79–97)
Neutrophils: 50 %
RBC: 4.38 x10E6/uL (ref 3.77–5.28)
WBC: 4 10*3/uL (ref 3.4–10.8)

## 2022-12-13 LAB — LIPID PANEL: Chol/HDL Ratio: 4.1 ratio (ref 0.0–4.4)

## 2022-12-16 ENCOUNTER — Encounter: Payer: Self-pay | Admitting: Family Medicine

## 2022-12-18 LAB — CBC WITH DIFFERENTIAL/PLATELET
EOS (ABSOLUTE): 0.1 10*3/uL (ref 0.0–0.4)
Eos: 3 %
Immature Granulocytes: 0 %
MCH: 29 pg (ref 26.6–33.0)
MCHC: 33 g/dL (ref 31.5–35.7)
Platelets: 221 10*3/uL (ref 150–450)
RDW: 13.5 % (ref 11.7–15.4)

## 2022-12-18 LAB — COMPREHENSIVE METABOLIC PANEL WITH GFR
ALT: 44 IU/L — ABNORMAL HIGH (ref 0–32)
AST: 42 IU/L — ABNORMAL HIGH (ref 0–40)
Albumin/Globulin Ratio: 1.7 (ref 1.2–2.2)
Albumin: 3.9 g/dL (ref 3.8–4.8)
Alkaline Phosphatase: 111 IU/L (ref 44–121)
BUN/Creatinine Ratio: 10 — ABNORMAL LOW (ref 12–28)
BUN: 8 mg/dL (ref 8–27)
Bilirubin Total: 0.5 mg/dL (ref 0.0–1.2)
CO2: 20 mmol/L (ref 20–29)
Calcium: 9.1 mg/dL (ref 8.7–10.3)
Chloride: 109 mmol/L — ABNORMAL HIGH (ref 96–106)
Creatinine, Ser: 0.8 mg/dL (ref 0.57–1.00)
Globulin, Total: 2.3 g/dL (ref 1.5–4.5)
Glucose: 121 mg/dL — ABNORMAL HIGH (ref 70–99)
Potassium: 4.5 mmol/L (ref 3.5–5.2)
Sodium: 144 mmol/L (ref 134–144)
Total Protein: 6.2 g/dL (ref 6.0–8.5)
eGFR: 76 mL/min/1.73

## 2022-12-18 LAB — METANEPHRINES, PLASMA: Metanephrine, Free: 26.5 pg/mL (ref 0.0–88.0)

## 2022-12-18 LAB — LIPID PANEL
Cholesterol, Total: 136 mg/dL (ref 100–199)
LDL Chol Calc (NIH): 80 mg/dL (ref 0–99)

## 2022-12-18 LAB — HEMOGLOBIN A1C
Est. average glucose Bld gHb Est-mCnc: 131 mg/dL
Hgb A1c MFr Bld: 6.2 % — ABNORMAL HIGH (ref 4.8–5.6)

## 2022-12-23 ENCOUNTER — Ambulatory Visit
Admission: RE | Admit: 2022-12-23 | Discharge: 2022-12-23 | Disposition: A | Discharge status: Home / Self Care | Payer: Medicare Other | Source: Ambulatory Visit | Attending: Family Medicine | Admitting: Family Medicine

## 2022-12-23 DIAGNOSIS — R911 Solitary pulmonary nodule: Secondary | ICD-10-CM | POA: Diagnosis not present

## 2022-12-23 DIAGNOSIS — Z87891 Personal history of nicotine dependence: Secondary | ICD-10-CM

## 2022-12-23 DIAGNOSIS — Z122 Encounter for screening for malignant neoplasm of respiratory organs: Secondary | ICD-10-CM

## 2022-12-29 ENCOUNTER — Other Ambulatory Visit: Payer: Self-pay | Admitting: Acute Care

## 2022-12-29 DIAGNOSIS — Z122 Encounter for screening for malignant neoplasm of respiratory organs: Secondary | ICD-10-CM

## 2022-12-29 DIAGNOSIS — Z87891 Personal history of nicotine dependence: Secondary | ICD-10-CM

## 2023-01-16 ENCOUNTER — Other Ambulatory Visit: Payer: Self-pay | Admitting: Pulmonary Disease

## 2023-01-17 ENCOUNTER — Other Ambulatory Visit: Payer: Self-pay | Admitting: Pulmonary Disease

## 2023-01-28 ENCOUNTER — Other Ambulatory Visit: Payer: Self-pay

## 2023-01-28 DIAGNOSIS — E782 Mixed hyperlipidemia: Secondary | ICD-10-CM

## 2023-01-28 DIAGNOSIS — Z0000 Encounter for general adult medical examination without abnormal findings: Secondary | ICD-10-CM

## 2023-01-28 DIAGNOSIS — I10 Essential (primary) hypertension: Secondary | ICD-10-CM

## 2023-02-02 ENCOUNTER — Other Ambulatory Visit: Payer: Self-pay | Admitting: Nurse Practitioner

## 2023-02-02 DIAGNOSIS — K219 Gastro-esophageal reflux disease without esophagitis: Secondary | ICD-10-CM

## 2023-02-04 ENCOUNTER — Ambulatory Visit (INDEPENDENT_AMBULATORY_CARE_PROVIDER_SITE_OTHER): Payer: Medicare Other | Admitting: Pulmonary Disease

## 2023-02-04 ENCOUNTER — Encounter: Payer: Self-pay | Admitting: Pulmonary Disease

## 2023-02-04 ENCOUNTER — Telehealth: Payer: Self-pay

## 2023-02-04 VITALS — BP 132/64 | HR 68 | Temp 98.2°F | Ht 62.0 in | Wt 200.2 lb

## 2023-02-04 DIAGNOSIS — R911 Solitary pulmonary nodule: Secondary | ICD-10-CM

## 2023-02-04 DIAGNOSIS — J449 Chronic obstructive pulmonary disease, unspecified: Secondary | ICD-10-CM

## 2023-02-04 DIAGNOSIS — Z87891 Personal history of nicotine dependence: Secondary | ICD-10-CM | POA: Diagnosis not present

## 2023-02-04 DIAGNOSIS — R0683 Snoring: Secondary | ICD-10-CM | POA: Diagnosis not present

## 2023-02-04 MED ORDER — REVEFENACIN 175 MCG/3ML IN SOLN: 175.0000 ug | Freq: Every day | RESPIRATORY_TRACT | 11 refills | Status: DC

## 2023-02-04 MED ORDER — REVEFENACIN 175 MCG/3ML IN SOLN: 175.0000 ug | Freq: Every day | RESPIRATORY_TRACT | Status: DC

## 2023-02-04 NOTE — Telephone Encounter (Signed)
Patient states she has been having problems with her feet and legs swelling also she has been experiencing high B/P . Patient wants to know what she should do. Patient states she has a up and coming appointment in December 9 th

## 2023-02-04 NOTE — Progress Notes (Signed)
Synopsis: Referred in December 2022 for shortness of breath  Subjective:   PATIENT ID: Janice Pace GENDER: female DOB: December 18, 1945, MRN: [id number]  HPI  Chief Complaint  Patient presents with   Follow-up    Breathing has has improved some. She has prod cough with clear sputum. She is using her albuterol inhaler once every other wk on average.    Janice Pace is a 77 year old woman, former smoker with GERD chronic back pain s/p anterior lateral interbody fusion of L2-3 and 3-4 with posterior spinal fixation in 2022 who returns to pulmonary clinic for COPD.   She complains of increase in exertional dyspnea, sweating and fatigue. For example after getting bathed and dressed it takes her a few minutes to recover. She denies chest pain, jaw pain or arm pain. She is using performist and budesonide nebs twice daily with as needed albuterol. She does snore at night and will wake her self up due to snoring. She reports labile BP readings.   OV 07/30/22 She was seen by Rhunette Croft, NP 05/06/22 and changed to breztri inhaler therapy at that time. She was on breo previously. She stopped breztri because she felt it gave her pneumonia. She had lung cancer screening CT Chest done with 5.22mm RUL nodule that is new and a 6 month follow up scan ordered.   She is waiting on performist and budesonide nebs this week. She just received her nebulizer machine.   She has been stable since her visit with Rhunette Croft, NP.   OV 02/03/22 She had reduced and increased RV on PFTs indicating air trapping.   She has been using breo about 3 days per week when her husband reminds her. She does not notice much change in her breathing when using it. She denies issues with cough or wheezing. She denies night time awakenings. She continues to have exertional dyspnea which is no new.   She is asking if a nebulizer regimen would be better than using the breo inhaler.   OV 08/07/21 She reports her breathing is much  better after starting Breo Ellipta 100-25 MCG 1 puff daily.  She is not noticing the cough and wheezing like before.  She reports her shortness of breath has improved as well.  She is accompanied by her husband today.  They are planning a [place] trip out [place] in [place] on their [employer] motorcycle.  OV 06/20/21 She reports having shortness of breath since her back surgery in August.  The dyspnea is worse with exertion.  She has intermittent cough and wheezing.  She had intermittent wheezing prior to the surgery.  Otherwise she denies significant dyspnea before her hospitalization as she was doing water aerobics on a regular basis.  She has resumed water aerobics but is going a couple days of the week and is noticing that her stamina is improving.  She has been evaluated by Dr. Jeanella Cara of cardiology with normal exercise stress test and no ischemic changes but reduced exercise capacity for her age.  Echocardiogram 04/16/2021 showed moderate concentric LVH with LVEF 55 to 60%.  Grade 1 diastolic dysfunction.  Trace tricuspid regurgitation.  She is a former smoker with 50+ pack year smoking history.  She smoked from age [age].  Past Medical History:  Diagnosis Date   Arthritis    Asthma    Bronchitis    Cancer (HCC)    dermatosivros arcoma and protuverans   Chronic back pain    degenerative disc disease  Constipation    stool softener daily   Constipation    COPD (chronic obstructive pulmonary disease) (HCC)    GERD (gastroesophageal reflux disease)    takes Prilosec daily   GERD (gastroesophageal reflux disease)    Heart murmur    Heart valve disorder    Hemorrhoids    History of hiatal hernia    Insomnia    d/t chantix   Joint pain    Nocturia    Personal history of colonic adenoma 03/13/2003   03/13/2003 - 5 mm adenoma   Pre-diabetes    Prediabetes    White coat syndrome without diagnosis of hypertension      Family History  Problem Relation Age of Onset   Leukemia Mother     Anesthesia problems Neg Hx    Hypotension Neg Hx    Malignant hyperthermia Neg Hx    Pseudochol deficiency Neg Hx    Colon cancer Neg Hx    Colon polyps Neg Hx    Esophageal cancer Neg Hx    Stomach cancer Neg Hx    Rectal cancer Neg Hx      Social History   Socioeconomic History   Marital status: Married    Spouse name: david   Number of children: 3   Years of education: Not on file   Highest education level: Not on file  Occupational History   Occupation: retired  Tobacco Use   Smoking status: Former    Current packs/day: 0.00    Average packs/day: 1 pack/day for 50.0 years (50.0 ttl pk-yrs)    Types: Cigarettes    Start date: 08/16/1961    Quit date: 08/17/2011    Years since quitting: 11.4    Passive exposure: Never   Smokeless tobacco: Never  Vaping Use   Vaping status: Never Used  Substance and Sexual Activity   Alcohol use: No    Comment: occasional wine   Drug use: No   Sexual activity: Yes    Birth control/protection: Surgical, Other-see comments  Other Topics Concern   Not on file  Social History Narrative   ** Merged History Encounter **       Social Determinants of Health   Financial Resource Strain: Low Risk  (05/28/2022)   Overall Financial Resource Strain (CARDIA)    Difficulty of Paying Living Expenses: Not hard at all  Food Insecurity: No Food Insecurity (05/28/2022)   Hunger Vital Sign    Worried About Running Out of Food in the Last Year: Never true    Ran Out of Food in the Last Year: Never true  Transportation Needs: No Transportation Needs (05/28/2022)   PRAPARE - Administrator, Civil Service (Medical): No    Lack of Transportation (Non-Medical): No  Physical Activity: Inactive (05/28/2022)   Exercise Vital Sign    Days of Exercise per Week: 0 days    Minutes of Exercise per Session: 0 min  Stress: No Stress Concern Present (05/28/2022)   [employer] of Occupational Health - Occupational Stress Questionnaire     Feeling of Stress : Only a little  Social Connections: Socially Integrated (05/28/2022)   Social Connection and Isolation Panel [NHANES]    Frequency of Communication with Friends and Family: More than three times a week    Frequency of Social Gatherings with Friends and Family: Once a week    Attends Religious Services: 1 to 4 times per year    Active Member of [employer] or Organizations: Yes  Attends Banker Meetings: 1 to 4 times per year    Marital Status: Married  Catering manager Violence: Not At Risk (05/28/2022)   Humiliation, Afraid, Rape, and Kick questionnaire    Fear of Current or Ex-Partner: No    Emotionally Abused: No    Physically Abused: No    Sexually Abused: No     Allergies  Allergen Reactions   Sulfa Antibiotics Hives and Itching   Atorvastatin Other (See Comments)    mucsle aches   Metformin Hcl Other (See Comments)    joint pains and muscle aches     Outpatient Medications Prior to Visit  Medication Sig Dispense Refill   albuterol (VENTOLIN HFA) 108 (90 Base) MCG/ACT inhaler Inhale 1-2 puffs into the lungs every 6 (six) hours as needed for wheezing or shortness of breath. 8 g 1   ascorbic acid (VITAMIN C) 1000 MG tablet Take 1 tablet by mouth daily.     aspirin 81 MG EC tablet Take 1 tablet by mouth daily.     budesonide (PULMICORT) 0.5 MG/2ML nebulizer solution Generic for Pulmicort. Inhale one vial in nebulizer twice a day. Rinse mouth after use. 60 mL 11   chlordiazePOXIDE (LIBRIUM) 10 MG capsule Take 1 capsule (10 mg total) by mouth 2 (two) times daily as needed for anxiety. 20 capsule 2   docusate sodium (COLACE) 100 MG capsule Take 500 mg by mouth at bedtime.     formoterol (PERFOROMIST) 20 MCG/2ML nebulizer solution Substituted for: Perforomist Solution Inhale one vial in nebulizer twice a day. 60 mL 11   gabapentin (NEURONTIN) 300 MG capsule Take 1-2 capsules (300-600 mg total) by mouth 3 (three) times daily as needed (Nerve pain). (Patient  taking differently: Take 300 mg by mouth at bedtime.) 180 capsule 2   metoprolol succinate (TOPROL-XL) 50 MG 24 hr tablet Take 1 tablet (50 mg total) by mouth daily. Take with or immediately following a meal. 90 tablet 1   omeprazole (PRILOSEC) 20 MG capsule Take 1 capsule (20 mg total) by mouth daily as needed (acid reflux). 90 capsule 0   rosuvastatin (CRESTOR) 10 MG tablet Take 1 tablet (10 mg total) by mouth daily. 90 tablet 1   No facility-administered medications prior to visit.   Review of Systems  Constitutional:  Positive for malaise/fatigue. Negative for chills, fever and weight loss.  HENT:  Negative for congestion, sinus pain and sore throat.   Eyes: Negative.   Respiratory:  Positive for shortness of breath. Negative for cough, hemoptysis, sputum production and wheezing.   Cardiovascular:  Negative for chest pain, palpitations, orthopnea, claudication and leg swelling.  Gastrointestinal:  Negative for abdominal pain, heartburn, nausea and vomiting.  Genitourinary: Negative.   Musculoskeletal:  Negative for joint pain and myalgias.  Skin:  Negative for rash.  Neurological:  Negative for weakness.  Endo/Heme/Allergies: Negative.   Psychiatric/Behavioral: Negative.     Objective:   Vitals:   02/04/23 0825  BP: 132/64  Pulse: 68  Temp: 98.2 F (36.8 C)  TempSrc: Oral  SpO2: 97%  Weight: 200 lb 3.2 oz (90.8 kg)  Height: 5\' 2"  (1.575 m)    Physical Exam Constitutional:      General: She is not in acute distress.    Appearance: She is obese. She is not ill-appearing.  HENT:     Head: Normocephalic and atraumatic.  Eyes:     General: No scleral icterus.    Conjunctiva/sclera: Conjunctivae normal.  Cardiovascular:     Rate and  Rhythm: Normal rate and regular rhythm.     Pulses: Normal pulses.     Heart sounds: Normal heart sounds. No murmur heard. Pulmonary:     Effort: Pulmonary effort is normal.     Breath sounds: Decreased air movement present. No decreased  breath sounds, wheezing, rhonchi or rales.  Musculoskeletal:     Right lower leg: No edema.     Left lower leg: No edema.  Skin:    General: Skin is warm and dry.  Neurological:     General: No focal deficit present.     Mental Status: She is alert.    CBC    Component Value Date/Time   WBC 4.0 12/02/2022 0936   WBC 6.9 09/12/2022 0825   RBC 4.38 12/02/2022 0936   RBC 4.27 09/12/2022 0825   HGB 12.7 12/02/2022 0936   HCT 38.5 12/02/2022 0936   PLT 221 12/02/2022 0936   MCV 88 12/02/2022 0936   MCH 29.0 12/02/2022 0936   MCH 31.6 09/12/2022 0825   MCHC 33.0 12/02/2022 0936   MCHC 34.5 09/12/2022 0825   RDW 13.5 12/02/2022 0936   LYMPHSABS 1.5 12/02/2022 0936   MONOABS 1.4 (H) 03/01/2021 0903   EOSABS 0.1 12/02/2022 0936   BASOSABS 0.0 12/02/2022 0936      Latest Ref Rng & Units 12/02/2022    9:36 AM 09/12/2022    8:25 AM 05/21/2022    8:51 AM  BMP  Glucose 70 - 99 mg/dL 270  350  093   BUN 8 - 27 mg/dL 8  9  12    Creatinine 0.57 - 1.00 mg/dL 8.18  2.99  3.71   BUN/Creat Ratio 12 - 28 10   16    Sodium 134 - 144 mmol/L 144  140  141   Potassium 3.5 - 5.2 mmol/L 4.5  4.1  5.0   Chloride 96 - 106 mmol/L 109  105  105   CO2 20 - 29 mmol/L 20  27  23    Calcium 8.7 - 10.3 mg/dL 9.1  9.2  9.1    Chest imaging: LCS CT Chest 12/23/22 1. Lung-RADS 2S, benign appearance or behavior. Continue annual screening with low-dose chest CT without contrast in 12 months. 2. The "S" modifier above refers to potentially clinically significant non lung cancer related findings. Specifically, there is aortic atherosclerosis, in addition to left main and three-vessel coronary artery disease. Please note that although the presence of coronary artery calcium documents the presence of coronary artery disease, the severity of this disease and any potential stenosis cannot be assessed on this non-gated CT examination. Assessment for potential risk factor modification, dietary therapy or  pharmacologic therapy may be warranted, if clinically indicated. 3. Mild diffuse bronchial wall thickening with very mild centrilobular and paraseptal emphysema; imaging findings suggestive of underlying COPD. 4. There are calcifications of the aortic valve. Echocardiographic correlation for evaluation of potential valvular dysfunction may be warranted if clinically indicated.  LCS CT Chest Scan 06/18/22 1. Lung-RADS 3, probably benign findings. Short-term follow-up in 6 months is recommended with repeat low-dose chest CT without contrast (please use the following order, "CT CHEST LCS NODULE FOLLOW-UP W/O CM"). New indistinct posterior right upper lobe nodule measuring 5.6 mm in volume derived mean diameter. 2. Three-vessel coronary atherosclerosis. 3. Mild diffuse hepatic steatosis. 4. Stable bilateral adrenal adenomas, for which no follow-up imaging is recommended. 5. Aortic Atherosclerosis  CTA Chest 05/17/21 1. Negative examination for pulmonary embolism. 2. Diffuse bilateral bronchial wall thickening and mild  mosaic attenuation of the airspaces throughout. Findings are consistent with nonspecific infectious or inflammatory bronchitis and small airways disease. 3. Minimal paraseptal emphysema. 4. Background of very fine centrilobular pulmonary nodules, most concentrated at the lung apices, most commonly seen in smoking-related respiratory bronchiolitis. 5. Coronary artery disease.  PFT:    Latest Ref Rng & Units 08/28/2021   10:46 AM  PFT Results  FVC-Pre L 2.21   FVC-Predicted Pre % 84   FVC-Post L 2.32   FVC-Predicted Post % 88   Pre FEV1/FVC % % 80   Post FEV1/FCV % % 80   FEV1-Pre L 1.78   FEV1-Predicted Pre % 90   FEV1-Post L 1.86   DLCO uncorrected ml/min/mmHg 20.18   DLCO UNC% % 112   DLCO corrected ml/min/mmHg 20.18   DLCO COR %Predicted % 112   DLVA Predicted % 109   TLC L 8.89   TLC % Predicted % 186   RV % Predicted % 294   PFT 2023: air trapping  present  Labs:  Path:  Echo 04/16/21: Left ventricle cavity is normal in size. Moderate concentric hypertrophy  of the left ventricle. Normal global wall motion. Normal LV systolic  function with visual EF 55-60%. Doppler evidence of grade I (impaired)  diastolic dysfunction, normal LAP.  Trace TR.  No evidence of pulmonary hypertension.  Heart Catheterization:  Assessment & Plan:   Chronic obstructive pulmonary disease, unspecified COPD type (HCC) - Plan: Home sleep test  Lung nodule  Former smoker  Snoring - Plan: Home sleep test  Discussion: Lakecia Deschamps is a 77 year old woman, former smoker with GERD chronic back pain s/p anterior lateral interbody fusion of L2-3 and 3 4 with posterior spinal fixation in 2022 who returns to pulmonary clinic for COPD.   She has air trapping on her PFTs and reduced . Recent CT Chest shows stable RUL nodule, plan for repeat LCS screening CT next year.   She is to continue budesonide and performist nebs. We will add yupelri nebs today for her on going symptoms.   We will check a home sleep study to evaluate for sleep apnea given her blood pressure issues, dyspnea, fatigue and snoring.   Simple walk in clinic without O2 desaturations below 88% after two laps. She stopped due to hip pain.   Recommend she follow up with cardiology to ensure her symptoms aren't cardiac related.  Follow up in 3 months.  Melody Comas, MD [facility] Office: [phone]   Current Outpatient Medications:    albuterol (VENTOLIN HFA) 108 (90 Base) MCG/ACT inhaler, Inhale 1-2 puffs into the lungs every 6 (six) hours as needed for wheezing or shortness of breath., Disp: 8 g, Rfl: 1   ascorbic acid (VITAMIN C) 1000 MG tablet, Take 1 tablet by mouth daily., Disp: , Rfl:    aspirin 81 MG EC tablet, Take 1 tablet by mouth daily., Disp: , Rfl:    budesonide (PULMICORT) 0.5 MG/2ML nebulizer solution, Generic for Pulmicort. Inhale one vial  in nebulizer twice a day. Rinse mouth after use., Disp: 60 mL, Rfl: 11   chlordiazePOXIDE (LIBRIUM) 10 MG capsule, Take 1 capsule (10 mg total) by mouth 2 (two) times daily as needed for anxiety., Disp: 20 capsule, Rfl: 2   docusate sodium (COLACE) 100 MG capsule, Take 500 mg by mouth at bedtime., Disp: , Rfl:    formoterol (PERFOROMIST) 20 MCG/2ML nebulizer solution, Substituted for: Perforomist Solution Inhale one vial in nebulizer twice a day., Disp:  60 mL, Rfl: 11   gabapentin (NEURONTIN) 300 MG capsule, Take 1-2 capsules (300-600 mg total) by mouth 3 (three) times daily as needed (Nerve pain). (Patient taking differently: Take 300 mg by mouth at bedtime.), Disp: 180 capsule, Rfl: 2   metoprolol succinate (TOPROL-XL) 50 MG 24 hr tablet, Take 1 tablet (50 mg total) by mouth daily. Take with or immediately following a meal., Disp: 90 tablet, Rfl: 1   omeprazole (PRILOSEC) 20 MG capsule, Take 1 capsule (20 mg total) by mouth daily as needed (acid reflux)., Disp: 90 capsule, Rfl: 0   rosuvastatin (CRESTOR) 10 MG tablet, Take 1 tablet (10 mg total) by mouth daily., Disp: 90 tablet, Rfl: 1

## 2023-02-04 NOTE — Patient Instructions (Addendum)
Continue pulmicort twice daily  Continue Performist twice daily  Start yupelri nebulizer daily - we will order this from [facility] as well  Use albuterol as needed  We will check a home sleep study  We will check your oxygen levels with simple walk today  Recommend follow up with Dr. Jacinto Halim to evaluate your symptoms to make sure your heart ok  Follow up in 3 months

## 2023-02-04 NOTE — Telephone Encounter (Signed)
It has been some time I have seen her, can she see her PCP until she can see [place] and please move her appointment up

## 2023-02-09 NOTE — Telephone Encounter (Signed)
Appt had been made for 02/25/23 @ 9:45 for patient to see [facility]

## 2023-02-11 ENCOUNTER — Other Ambulatory Visit: Payer: Medicare Other

## 2023-02-11 DIAGNOSIS — Z0000 Encounter for general adult medical examination without abnormal findings: Secondary | ICD-10-CM | POA: Diagnosis not present

## 2023-02-11 DIAGNOSIS — E782 Mixed hyperlipidemia: Secondary | ICD-10-CM | POA: Diagnosis not present

## 2023-02-11 DIAGNOSIS — I10 Essential (primary) hypertension: Secondary | ICD-10-CM | POA: Diagnosis not present

## 2023-02-14 ENCOUNTER — Encounter: Payer: Self-pay | Admitting: Pulmonary Disease

## 2023-02-16 ENCOUNTER — Encounter: Payer: Self-pay | Admitting: Family Medicine

## 2023-02-16 ENCOUNTER — Ambulatory Visit (INDEPENDENT_AMBULATORY_CARE_PROVIDER_SITE_OTHER): Payer: Medicare Other | Admitting: Family Medicine

## 2023-02-16 VITALS — BP 153/77 | HR 71 | Resp 18 | Ht 62.0 in

## 2023-02-16 DIAGNOSIS — R0602 Shortness of breath: Secondary | ICD-10-CM | POA: Diagnosis not present

## 2023-02-16 DIAGNOSIS — K219 Gastro-esophageal reflux disease without esophagitis: Secondary | ICD-10-CM

## 2023-02-16 DIAGNOSIS — R000 Tachycardia, unspecified: Secondary | ICD-10-CM

## 2023-02-16 DIAGNOSIS — G4730 Sleep apnea, unspecified: Secondary | ICD-10-CM | POA: Diagnosis not present

## 2023-02-16 DIAGNOSIS — I10 Essential (primary) hypertension: Secondary | ICD-10-CM

## 2023-02-16 DIAGNOSIS — R7303 Prediabetes: Secondary | ICD-10-CM | POA: Diagnosis not present

## 2023-02-16 DIAGNOSIS — E782 Mixed hyperlipidemia: Secondary | ICD-10-CM

## 2023-02-16 DIAGNOSIS — R002 Palpitations: Secondary | ICD-10-CM | POA: Diagnosis not present

## 2023-02-16 MED ORDER — METOPROLOL SUCCINATE ER 50 MG PO TB24
50.0000 mg | ORAL_TABLET | Freq: Every day | ORAL | 1 refills | Status: DC
Start: 2023-02-16 — End: 2023-08-10

## 2023-02-16 MED ORDER — OMEPRAZOLE 20 MG PO CPDR
20.0000 mg | DELAYED_RELEASE_CAPSULE | Freq: Every day | ORAL | 0 refills | Status: DC | PRN
Start: 2023-02-16 — End: 2023-06-25

## 2023-02-16 MED ORDER — ROSUVASTATIN CALCIUM 10 MG PO TABS
10.0000 mg | ORAL_TABLET | Freq: Every day | ORAL | 1 refills | Status: DC
Start: 2023-02-16 — End: 2023-08-26

## 2023-02-16 NOTE — Progress Notes (Signed)
Established Patient Office Visit  Subjective   Patient ID: Janice Pace, female    DOB: 25-Dec-1945  Age: 77 y.o. MRN: [id number]  Chief Complaint  Patient presents with   Hypertension    HPI Janice Pace is a 77 y.o. female presenting today for follow up of hypertension, hyperlipidemia, prediabetes.  She continues to complain of shortness of breath, fatigue, and fluctuating blood pressure that is either too high or too low when she checks it.  She has not kept a log of what those specific values have been.  She also continues to struggle with diaphoresis typically in the morning and occasional chills at night. Hypertension:  Pt denies chest pain, dizziness, edema, syncope, or heart palpitations. Taking metoprolol succinate, reports excellent compliance with treatment. Denies side effects. Hyperlipidemia: tolerating rosuvastatin well with no myalgias or significant side effects.  The ASCVD Risk score (Arnett DK, et al., 2019) failed to calculate for the following reasons:   The valid total cholesterol range is 130 to 320 mg/dL Prediabetes: denies hypoglycemic events, wounds or sores that are not healing well, increased thirst or urination.   ROS Negative unless otherwise noted in HPI   Objective:     BP (!) 153/77 (BP Location: Left Arm, Patient Position: Sitting, Cuff Size: Normal)   Pulse 71   Resp 18   Ht 5\' 2"  (1.575 m)   SpO2 96%   BMI 36.62 kg/m   Physical Exam Constitutional:      General: She is not in acute distress.    Appearance: Normal appearance.  HENT:     Head: Normocephalic and atraumatic.  Cardiovascular:     Rate and Rhythm: Normal rate and regular rhythm.     Heart sounds: No murmur heard.    No friction rub. No gallop.  Pulmonary:     Effort: Pulmonary effort is normal. No respiratory distress.     Breath sounds: No wheezing, rhonchi or rales.  Skin:    General: Skin is warm and dry.  Neurological:     Mental Status: She is alert and oriented  to person, place, and time.      Assessment & Plan:  Primary hypertension Assessment & Plan: Blood pressure elevated in office 153/77.  Would likely need to add on medication for hypertension, she has an upcoming appointment with cardiology on 02/25/2023 and they can discuss the best option given her recent symptoms as well.   Palpitations -     Metoprolol Succinate ER; Take 1 tablet (50 mg total) by mouth daily. Take with or immediately following a meal.  Dispense: 90 tablet; Refill: 1  Sinus tachycardia -     Metoprolol Succinate ER; Take 1 tablet (50 mg total) by mouth daily. Take with or immediately following a meal.  Dispense: 90 tablet; Refill: 1  Gastroesophageal reflux disease without esophagitis -     Omeprazole; Take 1 capsule (20 mg total) by mouth daily as needed (acid reflux).  Dispense: 90 capsule; Refill: 0  Mixed hyperlipidemia Assessment & Plan: Last lipid panel: LDL 51, HDL 44, triglycerides 109.  AST and ALT elevated but stable from last check consistent with nonalcoholic fatty liver.  Continue rosuvastatin 10 mg daily.   Will continue to monitor.  Orders: -     Rosuvastatin Calcium; Take 1 tablet (10 mg total) by mouth daily.  Dispense: 90 tablet; Refill: 1  Prediabetes Assessment & Plan: Last A1c 6.2.  Will repeat A1c at next appointment.  Will continue to monitor.  Shortness of breath -     ECHOCARDIOGRAM COMPLETE; Future  Given recent symptoms and potentially uncontrolled hypertension, ordering echocardiogram to rule out heart failure.  It is also possible that her symptoms are due to COPD.  CBC was unremarkable for changes in WBC, hemoglobin/hematocrit.  It is possible that diaphoresis and chills may be side effects from metoprolol, but would like to rule out any cardiac etiology prior to changing medication regimen.  Following her appointment with cardiology when they adjust her medications, we will investigate other potential causes.  She is also  interested in discussing weight management and will call her insurance company before next appointment to see which medications they may cover.  We will also discuss the results of her DEXA scan at her next appointment.  Return in about 3 months (around 05/19/2023) for follow-up for HTN, HLD, sweats/chills, weight management, osteoporosis.    Melida Quitter, PA

## 2023-02-16 NOTE — Assessment & Plan Note (Addendum)
Last A1c 6.2.  Will repeat A1c at next appointment.  Will continue to monitor.

## 2023-02-16 NOTE — Assessment & Plan Note (Signed)
Blood pressure elevated in office 153/77.  Would likely need to add on medication for hypertension, she has an upcoming appointment with cardiology on 02/25/2023 and they can discuss the best option given her recent symptoms as well.

## 2023-02-16 NOTE — Assessment & Plan Note (Signed)
Last lipid panel: LDL 51, HDL 44, triglycerides 109.  AST and ALT elevated but stable from last check consistent with nonalcoholic fatty liver.  Continue rosuvastatin 10 mg daily.   Will continue to monitor.

## 2023-02-16 NOTE — Patient Instructions (Signed)
HOMEWORK: Call your insurance company and ask them which weight loss medications they will cover and what their criteria is.  Keep a log of your blood pressure when you are at home, especially when you are having symptoms.

## 2023-02-19 DIAGNOSIS — M47814 Spondylosis without myelopathy or radiculopathy, thoracic region: Secondary | ICD-10-CM | POA: Diagnosis not present

## 2023-02-20 ENCOUNTER — Ambulatory Visit (HOSPITAL_COMMUNITY)
Admission: RE | Admit: 2023-02-20 | Discharge: 2023-02-20 | Disposition: A | Discharge status: Home / Self Care | Payer: Medicare Other | Source: Ambulatory Visit | Attending: Family Medicine | Admitting: Family Medicine

## 2023-02-20 DIAGNOSIS — R0602 Shortness of breath: Secondary | ICD-10-CM | POA: Diagnosis not present

## 2023-02-20 DIAGNOSIS — I10 Essential (primary) hypertension: Secondary | ICD-10-CM | POA: Diagnosis not present

## 2023-02-20 DIAGNOSIS — E785 Hyperlipidemia, unspecified: Secondary | ICD-10-CM | POA: Diagnosis not present

## 2023-02-20 LAB — ECHOCARDIOGRAM COMPLETE
AR max vel: 1.73 cm2
AV Area VTI: 1.65 cm2
AV Area mean vel: 1.67 cm2
AV Mean grad: 5 mmHg
AV Peak grad: 9.9 mmHg
Ao pk vel: 1.57 m/s
Area-P 1/2: 3.42 cm2
S' Lateral: 2.3 cm

## 2023-02-25 ENCOUNTER — Encounter: Payer: Self-pay | Admitting: Cardiology

## 2023-02-25 ENCOUNTER — Ambulatory Visit: Payer: Medicare Other | Admitting: Cardiology

## 2023-02-25 VITALS — BP 139/59 | HR 73 | Resp 16 | Ht 62.0 in | Wt 198.0 lb

## 2023-02-25 DIAGNOSIS — I2510 Atherosclerotic heart disease of native coronary artery without angina pectoris: Secondary | ICD-10-CM

## 2023-02-25 DIAGNOSIS — I10 Essential (primary) hypertension: Secondary | ICD-10-CM

## 2023-02-25 DIAGNOSIS — E782 Mixed hyperlipidemia: Secondary | ICD-10-CM

## 2023-02-25 DIAGNOSIS — R0609 Other forms of dyspnea: Secondary | ICD-10-CM | POA: Diagnosis not present

## 2023-02-25 DIAGNOSIS — R0989 Other specified symptoms and signs involving the circulatory and respiratory systems: Secondary | ICD-10-CM

## 2023-02-25 MED ORDER — SPIRONOLACTONE 25 MG PO TABS: 25.0000 mg | ORAL_TABLET | ORAL | 2 refills | Status: DC

## 2023-02-25 NOTE — Progress Notes (Unsigned)
Primary Physician/Referring:  Melida Quitter, PA  Patient ID: Janice Pace, female    DOB: Jun 06, 1946, 77 y.o.   MRN: [id number]  Chief Complaint  Patient presents with   Hypertension   Leg Swelling   HPI:    Janice Pace  is a 77 y.o. Patient female patient with hypercholesterolemia, COPD with centrilobular emphysema from prior tobacco use (quit 2014), obesity, chronic back pain, prior lumbosacral spinal fixation, severe triple-vessel coronary artery calcification noted on the CT scan including left main disease, was previously evaluated by [place] in 2022 for elevated heart rate and dyspnea on exertion.  She made an appointment to see [place] due to leg edema and dyspnea on exertion.  Patient states that over the past 6 to 8 weeks, she has noticed marked increasing dyspnea, uncontrolled hypertension and also leg edema.  No PND or orthopnea.  Her activity is markedly limited due to arthritis in the back and dyspnea.  Denies chest pain or palpitations.  No dizziness or syncope.  Denies symptoms of claudication.  Past Medical History:  Diagnosis Date   Arthritis    Asthma    Bronchitis    Cancer (HCC)    dermatosivros arcoma and protuverans   Chronic back pain    degenerative disc disease   Constipation    stool softener daily   Constipation    COPD (chronic obstructive pulmonary disease) (HCC)    GERD (gastroesophageal reflux disease)    takes Prilosec daily   GERD (gastroesophageal reflux disease)    Heart murmur    Heart valve disorder    Hemorrhoids    History of hiatal hernia    Insomnia    d/t chantix   Joint pain    Nocturia    Personal history of colonic adenoma 03/13/2003   03/13/2003 - 5 mm adenoma   Pre-diabetes    Prediabetes    White coat syndrome without diagnosis of hypertension    Past Surgical History:  Procedure Laterality Date   ABDOMINAL HYSTERECTOMY     ANTERIOR LAT LUMBAR FUSION N/A 02/27/2021   Procedure: Anterior lateral interbody fusion - lateral  two - lateral three - lateral three - lateral four with exploration fusion L4-S1;  Surgeon: Donalee Citrin, MD;  Location: [facility] OR;  Service: Neurosurgery;  Laterality: N/A;   BACK SURGERY  1988   BACK SURGERY  09-03-11   rods,screws x 8    BUNIONECTOMY     bilateral   BUNIONECTOMY     bil feet   COLONOSCOPY     dermatosibrosarcoma protuberans     LAMINECTOMY WITH POSTERIOR LATERAL ARTHRODESIS LEVEL 3 N/A 02/27/2021   Procedure: Posterior augmentation with pedicle screws Latreal one - lateral four - Posterior Lateral and Interbody fusion;  Surgeon: Donalee Citrin, MD;  Location: [facility] OR;  Service: Neurosurgery;  Laterality: N/A;   PARTIAL HYSTERECTOMY     RECTOCELE REPAIR     Rectum Repair     RESECTION TUMOR WRIST RADICAL     x 2 rt   TUBAL LIGATION  1974   TUBAL LIGATION     WRIST SURGERY     left   Family History  Problem Relation Age of Onset   Leukemia Mother    Anesthesia problems Neg Hx    Hypotension Neg Hx    Malignant hyperthermia Neg Hx    Pseudochol deficiency Neg Hx    Colon cancer Neg Hx    Colon polyps Neg Hx    Esophageal cancer Neg  Hx    Stomach cancer Neg Hx    Rectal cancer Neg Hx     Social History   Tobacco Use   Smoking status: Former    Current packs/day: 0.00    Average packs/day: 1 pack/day for 50.0 years (50.0 ttl pk-yrs)    Types: Cigarettes    Start date: 08/16/1961    Quit date: 08/17/2011    Years since quitting: 11.5    Passive exposure: Never   Smokeless tobacco: Never  Substance Use Topics   Alcohol use: No    Comment: occasional wine   Marital Status: Married  ROS  Review of Systems  Cardiovascular:  Positive for dyspnea on exertion and leg swelling (occasiona). Negative for chest pain.   Objective      02/25/2023    9:41 AM 02/16/2023   10:48 AM 02/16/2023   10:23 AM  Vitals with BMI  Height 5\' 2"   5\' 2"   Weight 198 lbs  --  BMI 36.21    Systolic 139 153 161  Diastolic 59 77 77  Pulse 73  71   Blood pressure (!) 139/59, pulse 73,  resp. rate 16, height 5\' 2"  (1.575 m), weight 198 lb (89.8 kg), SpO2 95%.  Physical Exam Constitutional:      Appearance: She is obese.  Neck:     Vascular: Carotid bruit (bilateral) present. No JVD.  Cardiovascular:     Rate and Rhythm: Normal rate and regular rhythm.     Pulses: Intact distal pulses.     Heart sounds: Normal heart sounds. No murmur heard.    No gallop.  Pulmonary:     Effort: Pulmonary effort is normal.     Breath sounds: Normal breath sounds.  Abdominal:     General: Bowel sounds are normal.     Palpations: Abdomen is soft.  Musculoskeletal:     Right lower leg: No edema.     Left lower leg: No edema.    Laboratory examination:   Recent Labs    09/12/22 0825 12/02/22 0936 02/11/23 0845  NA 140 144 144  K 4.1 4.5 4.8  CL 105 109* 106  CO2 27 20 23   GLUCOSE 125* 121* 107*  BUN 9 8 9   CREATININE 0.79 0.80 0.79  CALCIUM 9.2 9.1 9.3  GFRNONAA >60  --   --     Lab Results  Component Value Date   GLUCOSE 107 (H) 02/11/2023   NA 144 02/11/2023   K 4.8 02/11/2023   CL 106 02/11/2023   CO2 23 02/11/2023   BUN 9 02/11/2023   CREATININE 0.79 02/11/2023   EGFR 77 02/11/2023   CALCIUM 9.3 02/11/2023   PROT 6.4 02/11/2023   ALBUMIN 4.1 02/11/2023   LABGLOB 2.3 02/11/2023   AGRATIO 1.7 12/02/2022   BILITOT 0.5 02/11/2023   ALKPHOS 108 02/11/2023   AST 49 (H) 02/11/2023   ALT 47 (H) 02/11/2023   ANIONGAP 8 09/12/2022      Lab Results  Component Value Date   ALT 47 (H) 02/11/2023   AST 49 (H) 02/11/2023   ALKPHOS 108 02/11/2023   BILITOT 0.5 02/11/2023       Latest Ref Rng & Units 12/02/2022    9:36 AM 09/12/2022    8:25 AM 05/21/2022    8:51 AM  CBC  WBC 3.4 - 10.8 x10E3/uL 4.0  6.9  6.4   Hemoglobin 11.1 - 15.9 g/dL 09.6  04.5  40.9   Hematocrit 34.0 - 46.6 % 38.5  39.1  40.4   Platelets 150 - 450 x10E3/uL 221  199  207        Latest Ref Rng & Units 02/11/2023    8:45 AM 12/02/2022    9:36 AM 05/21/2022    8:51 AM  Hepatic Function   Total Protein 6.0 - 8.5 g/dL 6.4  6.2  6.0   Albumin 3.8 - 4.8 g/dL 4.1  3.9  4.2   AST 0 - 40 IU/L 49  42  20   ALT 0 - 32 IU/L 47  44  26   Alk Phosphatase 44 - 121 IU/L 108  111  89   Total Bilirubin 0.0 - 1.2 mg/dL 0.5  0.5  0.6     Lipid Panel Recent Labs    05/21/22 0851 12/02/22 0936 02/11/23 0845  CHOL 106 136 115  TRIG 98 126 109  LDLCALC 48 80 51  HDL 39* 33* 44  CHOLHDL 2.7 4.1 2.6    HEMOGLOBIN A1C Lab Results  Component Value Date   HGBA1C 6.2 (H) 12/02/2022   MPG 119.76 02/19/2021   TSH  Recent Labs    05/21/22 0851 12/02/22 0936  TSH 2.080 2.050    Radiology:   CXR PA/LAT 04/03/2021: Few densities at the left lung base appear chronic and could represent a small amount of scarring. Otherwise, the lungs are clear. Heart and mediastinum are within normal limits. Spinal hardware in the upper lumbar spine. No pleural effusions.  LCS CT Chest 12/23/22 1. Lung-RADS 2S, benign appearance or behavior. Continue annual screening with low-dose chest CT without contrast in 12 months. 2. Aortic atherosclerosis, in addition to left main and three-vessel coronary artery disease. Please note that although the presence of coronary artery calcium documents the presence of coronary artery disease, the severity of this disease and any potential stenosis cannot be assessed on this non-gated CT examination. Assessment for potential risk factor modification, dietary therapy or pharmacologic therapy may be warranted, if clinically indicated. 3. Mild diffuse bronchial wall thickening with very mild centrilobular and paraseptal emphysema; imaging findings suggestive of underlying COPD. 4. There are calcifications of the aortic valve. Echocardiographic correlation for evaluation of potential valvular dysfunction may be warranted if clinically indicated  Cardiac Studies:   Treadmill exercise stress test 06/01/2017: Indication: Dyspnea. Resting EKG demonstrates Normal  sinus rhythm. The patient exercised on Bruce protocol for  6:00 min. Patient 7.05METS and reached HR 135   bpm, which is   90% of maximum age-predicted HR.  Stress test terminated due to Fatigue and dyspnea and achieved 90% of MPHR for age. Hypertensive at rest and normal response with stress. Rest BP  168/94 mm Hg. Chest Pain: none.  Arrhythmias: none. ST Changes: With peak exercise there was no ST-T changes of ischemia. Overall Impression: Normal stress test. Exercise capacity was below average for age.  PCV MYOCARDIAL PERFUSION WITH LEXISCAN 05/29/2021 Nondiagnostic ECG stress. Myocardial perfusion is normal. LV is normal in small size in both rest and stress images. Overall LV systolic function is normal without regional wall motion abnormalities. Stress LV EF: 47%. Visually LVEF is normal and may be underestimated due to low volume LV. No previous exam available for comparison. Low risk.   Echocardiogram 02/20/2023:    1. Left ventricular ejection fraction, by estimation, is 70 to 75%. The left ventricle has hyperdynamic function. The left ventricle has no regional wall motion abnormalities. There is mild left ventricular hypertrophy. Left ventricular diastolic  parameters were normal.  2. Right ventricular  systolic function is normal. The right ventricular size is normal.  3. The mitral valve is normal in structure. Trivial mitral valve regurgitation.  4. The aortic valve was not well visualized. Aortic valve regurgitation is trivial. No aortic stenosis is present.  5. The inferior vena cava is normal in size with greater than 50% respiratory variability, suggesting right atrial pressure of 3 mmHg.  EKG:   EKG 02/25/2023: Normal sinus rhythm with rate of 65 bpm, incomplete right bundle branch block.  Normal EKG.  Compared to 06/20/2022, no significant change.  Medications and allergies   Allergies  Allergen Reactions   Sulfa Antibiotics Hives and Itching   Atorvastatin Other (See  Comments)    mucsle aches   Metformin Hcl Other (See Comments)    joint pains and muscle aches    Medication list   Current Outpatient Medications:    albuterol (VENTOLIN HFA) 108 (90 Base) MCG/ACT inhaler, Inhale 1-2 puffs into the lungs every 6 (six) hours as needed for wheezing or shortness of breath., Disp: 8 g, Rfl: 1   ascorbic acid (VITAMIN C) 1000 MG tablet, Take 1 tablet by mouth daily., Disp: , Rfl:    aspirin 81 MG EC tablet, Take 1 tablet by mouth daily., Disp: , Rfl:    budesonide (PULMICORT) 0.5 MG/2ML nebulizer solution, Generic for Pulmicort. Inhale one vial in nebulizer twice a day. Rinse mouth after use., Disp: 60 mL, Rfl: 11   chlordiazePOXIDE (LIBRIUM) 10 MG capsule, Take 1 capsule (10 mg total) by mouth 2 (two) times daily as needed for anxiety., Disp: 20 capsule, Rfl: 2   docusate sodium (COLACE) 100 MG capsule, Take 500 mg by mouth at bedtime., Disp: , Rfl:    formoterol (PERFOROMIST) 20 MCG/2ML nebulizer solution, Substituted for: Perforomist Solution Inhale one vial in nebulizer twice a day., Disp: 60 mL, Rfl: 11   gabapentin (NEURONTIN) 300 MG capsule, Take 1-2 capsules (300-600 mg total) by mouth 3 (three) times daily as needed (Nerve pain). (Patient taking differently: Take 300 mg by mouth at bedtime.), Disp: 180 capsule, Rfl: 2   metoprolol succinate (TOPROL-XL) 50 MG 24 hr tablet, Take 1 tablet (50 mg total) by mouth daily. Take with or immediately following a meal., Disp: 90 tablet, Rfl: 1   omeprazole (PRILOSEC) 20 MG capsule, Take 1 capsule (20 mg total) by mouth daily as needed (acid reflux)., Disp: 90 capsule, Rfl: 0   revefenacin (YUPELRI) 175 MCG/3ML nebulizer solution, Take 3 mLs (175 mcg total) by nebulization daily., Disp: , Rfl:    revefenacin (YUPELRI) 175 MCG/3ML nebulizer solution, Take 3 mLs (175 mcg total) by nebulization daily., Disp: 90 mL, Rfl: 11   rosuvastatin (CRESTOR) 10 MG tablet, Take 1 tablet (10 mg total) by mouth daily., Disp: 90 tablet,  Rfl: 1   spironolactone (ALDACTONE) 25 MG tablet, Take 1 tablet (25 mg total) by mouth every morning., Disp: 30 tablet, Rfl: 2  Assessment     ICD-10-CM   1. Atherosclerosis of native coronary artery of native heart without angina pectoris  I25.10 EKG 12-Lead    PCV MYOCARDIAL PERFUSION WITH LEXISCAN    2. Dyspnea on exertion  R06.09 PCV MYOCARDIAL PERFUSION WITH LEXISCAN    Basic metabolic panel    Brain natriuretic peptide    3. Primary hypertension  I10 spironolactone (ALDACTONE) 25 MG tablet    4. Mixed hyperlipidemia  E78.2     5. Bilateral carotid bruits  R09.89 PCV CAROTID DUPLEX (BILATERAL)       Orders  Placed This Encounter  Procedures   Basic metabolic panel   Brain natriuretic peptide   PCV MYOCARDIAL PERFUSION WITH LEXISCAN    Standing Status:   Future    Standing Expiration Date:   04/27/2023   EKG 12-Lead    Meds ordered this encounter  Medications   spironolactone (ALDACTONE) 25 MG tablet    Sig: Take 1 tablet (25 mg total) by mouth every morning.    Dispense:  30 tablet    Refill:  2    There are no discontinued medications.   Recommendations:   Janice Pace is a 77 y.o. Patient female patient with hypercholesterolemia, COPD with centrilobular emphysema from prior tobacco use (quit 2014), obesity, chronic back pain, prior lumbosacral spinal fixation, evaluated by [place] for elevated heart rate and dyspnea on exertion.  She made an appointment to see [place] due to leg edema and dyspnea on exertion.   1. Atherosclerosis of native coronary artery of native heart without angina pectoris CT Chest 12/23/2022 revealing left main and three-vessel coronary artery disease.  She is likely secondary due to severe dyspnea and hence angina could be masked, in view of recent worsening dyspnea, will complete oncologic nuclear stress test she is unable to exercise due to dyspnea.  Presently on aspirin, metoprolol and rosuvastatin.  - EKG 12-Lead - PCV MYOCARDIAL PERFUSION WITH  LEXISCAN; Future  2. Dyspnea on exertion As dictated above, will also exclude CHF, obtain BMP, recent echocardiogram reviewed revealing hyperdynamic LVEF.  She may have a component of chronic diastolic heart failure. - PCV MYOCARDIAL PERFUSION WITH LEXISCAN; Future - Basic metabolic panel - Brain natriuretic peptide  3. Primary hypertension Blood pressure is elevated, will add spironolactone 25 mg 1 tablet daily. Labs reviewed, renal function is normal and potassium levels are normal.  She will need repeat BMP in 2 to 3 weeks after starting spironolactone. - spironolactone (ALDACTONE) 25 MG tablet; Take 1 tablet (25 mg total) by mouth every morning.  Dispense: 30 tablet; Refill: 2  4. Mixed hyperlipidemia Lipids reviewed, LDL is under excellent control at 48 rosuvastatin.  5. Bilateral carotid bruits Will obtain carotid artery duplex.  I would like to see her back in 2 months for follow-up. - PCV CAROTID DUPLEX (BILATERAL); Future     Yates Decamp, MD, [facility] 02/26/2023, 10:31 PM Office: [phone]

## 2023-02-26 ENCOUNTER — Telehealth: Payer: Self-pay | Admitting: *Deleted

## 2023-02-26 NOTE — Telephone Encounter (Signed)
Pt calling to see if PCP received message from Dr. Jacinto Halim about starting on a medication she thought to help who lose some weight.  Please advise and let patient know.

## 2023-03-02 ENCOUNTER — Ambulatory Visit: Payer: Medicare Other

## 2023-03-02 DIAGNOSIS — I2510 Atherosclerotic heart disease of native coronary artery without angina pectoris: Secondary | ICD-10-CM

## 2023-03-02 DIAGNOSIS — R0609 Other forms of dyspnea: Secondary | ICD-10-CM | POA: Diagnosis not present

## 2023-03-02 NOTE — Telephone Encounter (Signed)
I did not receive anything from Dr. Jacinto Halim regarding any medication.  If it is something that she wants to discuss we can do so at her upcoming appointment on 05/19/2023, or she can try to schedule something sooner if she does not want to wait that long.

## 2023-03-03 ENCOUNTER — Telehealth: Payer: Self-pay

## 2023-03-03 NOTE — Telephone Encounter (Signed)
Patient states you was suppose to send a letter to her PCP for a weight loss medication. Patient wasn't sure what medication it was. I also looked in note and couldn't find it. Patient wants to be started as soon as possible.

## 2023-03-03 NOTE — Telephone Encounter (Signed)
Pt was informed of below and was going to reach back out to Dr. Nadara Eaton.

## 2023-03-04 ENCOUNTER — Other Ambulatory Visit: Payer: Medicare Other

## 2023-03-04 ENCOUNTER — Telehealth: Payer: Self-pay | Admitting: Pulmonary Disease

## 2023-03-04 DIAGNOSIS — G4733 Obstructive sleep apnea (adult) (pediatric): Secondary | ICD-10-CM

## 2023-03-04 NOTE — Telephone Encounter (Signed)
Pt calling for her HST results

## 2023-03-04 NOTE — Telephone Encounter (Signed)
Nalaxone  for weight loss, Consider Wellbutrin combination, I am including Lequita Halt in this chat

## 2023-03-06 ENCOUNTER — Other Ambulatory Visit: Payer: Medicare Other

## 2023-03-09 NOTE — Progress Notes (Signed)
Regadenoson Nuclear stress test 03/02/2023: Myocardial perfusion is normal. Overall LV systolic function is normal without regional wall motion abnormalities. Stress LV EF: 62%.  Nondiagnostic ECG stress. The heart rate response was consistent with Regadenoson.  No previous exam available for comparison. Low risk.

## 2023-03-10 ENCOUNTER — Encounter (INDEPENDENT_AMBULATORY_CARE_PROVIDER_SITE_OTHER): Payer: Medicare Other

## 2023-03-10 DIAGNOSIS — R0683 Snoring: Secondary | ICD-10-CM

## 2023-03-10 DIAGNOSIS — G4733 Obstructive sleep apnea (adult) (pediatric): Secondary | ICD-10-CM | POA: Diagnosis not present

## 2023-03-10 DIAGNOSIS — J449 Chronic obstructive pulmonary disease, unspecified: Secondary | ICD-10-CM

## 2023-03-12 ENCOUNTER — Telehealth: Payer: Self-pay

## 2023-03-12 ENCOUNTER — Ambulatory Visit: Payer: Medicare Other

## 2023-03-12 DIAGNOSIS — R0989 Other specified symptoms and signs involving the circulatory and respiratory systems: Secondary | ICD-10-CM

## 2023-03-12 NOTE — Telephone Encounter (Signed)
Pt calls back after speaking with insurance informing us that insurance doesn't have a preferred weight loss medication but a PA should be submit with enough supporting reasons as to why it is necessary.   Pt states that both her Cardiologist and Pulmonologist are both requesting and suggesting about a 50-60 lb loss. Pt explains that she is very limited to the exercise that she can do because of past back surgery .  I informed pt that provider would be back in the office early part of next week.

## 2023-03-13 DIAGNOSIS — R0609 Other forms of dyspnea: Secondary | ICD-10-CM | POA: Diagnosis not present

## 2023-03-14 LAB — BASIC METABOLIC PANEL
BUN/Creatinine Ratio: 13 (ref 12–28)
BUN: 10 mg/dL (ref 8–27)
CO2: 23 mmol/L (ref 20–29)
Calcium: 8.8 mg/dL (ref 8.7–10.3)
Chloride: 106 mmol/L (ref 96–106)
Creatinine, Ser: 0.78 mg/dL (ref 0.57–1.00)
Glucose: 106 mg/dL — ABNORMAL HIGH (ref 70–99)
Potassium: 4.4 mmol/L (ref 3.5–5.2)
Sodium: 141 mmol/L (ref 134–144)
eGFR: 79 mL/min/{1.73_m2} (ref >59)

## 2023-03-14 LAB — BRAIN NATRIURETIC PEPTIDE: BNP: 106.1 pg/mL — ABNORMAL HIGH (ref 0.0–100.0)

## 2023-03-17 NOTE — Telephone Encounter (Signed)
Before starting a new medication for her, I we will want to have an appointment with her to discuss the options, how they work, and potential side effects.  We can even do this over a video visit.  You can add her onto a day that I have < 16 patients

## 2023-03-24 ENCOUNTER — Encounter: Payer: Self-pay | Admitting: Family Medicine

## 2023-03-24 ENCOUNTER — Ambulatory Visit (INDEPENDENT_AMBULATORY_CARE_PROVIDER_SITE_OTHER): Payer: Medicare Other | Admitting: Family Medicine

## 2023-03-24 VITALS — BP 149/84 | HR 66 | Resp 18 | Ht 62.0 in | Wt 195.0 lb

## 2023-03-24 DIAGNOSIS — E782 Mixed hyperlipidemia: Secondary | ICD-10-CM

## 2023-03-24 DIAGNOSIS — R7303 Prediabetes: Secondary | ICD-10-CM

## 2023-03-24 DIAGNOSIS — Z6835 Body mass index (BMI) 35.0-35.9, adult: Secondary | ICD-10-CM

## 2023-03-24 DIAGNOSIS — I10 Essential (primary) hypertension: Secondary | ICD-10-CM

## 2023-03-24 DIAGNOSIS — G4733 Obstructive sleep apnea (adult) (pediatric): Secondary | ICD-10-CM | POA: Diagnosis not present

## 2023-03-24 MED ORDER — NALTREXONE-BUPROPION HCL ER 8-90 MG PO TB12
ORAL_TABLET | ORAL | 0 refills | Status: DC
Start: 2023-03-24 — End: 2023-04-07

## 2023-03-24 NOTE — Progress Notes (Signed)
Established Patient Office Visit  Subjective   Patient ID: Janice Pace, female    DOB: 1946-02-02  Age: 77 y.o. MRN: [id number]  Chief Complaint  Patient presents with   Weight Management Screening    HPI Janice Pace is a 77 y.o. female presenting today to discuss weight management.  Her cardiologist recommended naloxone for weight loss either alone or in combination with Wellbutrin.  Patient has struggled with weight for quite some time and knows that it is contributing negatively to her health.  With her history of hypertension and hardening of the aorta, her cardiologist is concerned with controlling blood pressure which is more difficult due to excess weight affecting metabolic health and impeding her ability to exercise.  Likewise, she had a home sleep test conducted with findings of obstructive sleep apnea for which pulmonology recommended weight loss.  Her cardiologist's specific goal is for her to lose around 50 pounds, though even a 5% reduction in weight would provide health benefits.  She is motivated to lose weight as she believes that all improve her health and make her feel better.  ROS Negative unless otherwise noted in HPI   Objective:     BP (!) 149/84 (BP Location: Left Arm, Patient Position: Sitting, Cuff Size: Normal)   Pulse 66   Resp 18   Ht 5\' 2"  (1.575 m)   Wt 195 lb (88.5 kg)   SpO2 97%   BMI 35.67 kg/m   Physical Exam Constitutional:      General: She is not in acute distress.    Appearance: Normal appearance.  HENT:     Head: Normocephalic and atraumatic.  Cardiovascular:     Rate and Rhythm: Normal rate and regular rhythm.     Heart sounds: No murmur heard.    No friction rub. No gallop.  Pulmonary:     Effort: Pulmonary effort is normal. No respiratory distress.     Breath sounds: No wheezing, rhonchi or rales.  Skin:    General: Skin is warm and dry.  Neurological:     Mental Status: She is alert and oriented to person, place, and  time.     Assessment & Plan:  Severe obesity (BMI 35.0-35.9 with comorbidity) (HCC) Assessment & Plan: Patient has been unsuccessful in weight management with diet and exercise alone.  Her exercise is also limited by back pain that is exacerbated by weight, which creates a vicious cycle.  She would greatly benefit from therapy with naltrexone and bupropion to reduce weight and better control hypertension, hyperlipidemia, prevent prediabetes from becoming diabetes, and improve obstructive sleep apnea.  Reduced weight would also alleviate pain on her back, hips, and knees allowing her improved mobility and the ability to get routine physical activity pain-free.  We discussed mechanism of action and potential side effects.  Patient is agreeable to starting naltrexone-bupropion.  I provided written instructions regarding titrating schedule over the first month.  Answered all questions and addressed concerns from patient and her husband.  Will continue to monitor.  If there is less than 5% reduction in weight after the first 3 months, recommend changing therapy.  Orders: -     Naltrexone-buPROPion HCl ER; 1 tab daily for week 1, then 1 tab BID for week 2, then 2 tab PO qAM and 1 tab PO qPM for week 3, then 2 tabs BID.  Dispense: 120 tablet; Refill: 0  Primary hypertension Assessment & Plan: BP goal <130/80.  Cardiology recommended addition of spironolactone 25  mg daily.  Patient is not at goal in office today yet.  She will return in 2 weeks for a nurse blood pressure visit and keep an ambulatory blood pressure log until then.  Continue metoprolol 50 mg daily, spironolactone 25 mg daily.  If necessary, will contact cardiology to further adjust medication to meet blood pressure goal.  Orders: -     Naltrexone-buPROPion HCl ER; 1 tab daily for week 1, then 1 tab BID for week 2, then 2 tab PO qAM and 1 tab PO qPM for week 3, then 2 tabs BID.  Dispense: 120 tablet; Refill: 0  Mixed hyperlipidemia -      Naltrexone-buPROPion HCl ER; 1 tab daily for week 1, then 1 tab BID for week 2, then 2 tab PO qAM and 1 tab PO qPM for week 3, then 2 tabs BID.  Dispense: 120 tablet; Refill: 0  Prediabetes -     Naltrexone-buPROPion HCl ER; 1 tab daily for week 1, then 1 tab BID for week 2, then 2 tab PO qAM and 1 tab PO qPM for week 3, then 2 tabs BID.  Dispense: 120 tablet; Refill: 0  OSA (obstructive sleep apnea) -     Naltrexone-buPROPion HCl ER; 1 tab daily for week 1, then 1 tab BID for week 2, then 2 tab PO qAM and 1 tab PO qPM for week 3, then 2 tabs BID.  Dispense: 120 tablet; Refill: 0    Return in 8 weeks (on 05/19/2023) for follow-up for weight management, HTN, HLD, prediabetes.   I spent 40 minutes on the day of the encounter to include pre-visit record review and coordination of care, face-to-face time with the patient, and post visit ordering of medication.  Melida Quitter, PA

## 2023-03-24 NOTE — Patient Instructions (Addendum)
Starting naltrexone-bupropion combination pills: Week 1: 1 tablet daily each morning Week 2: 1 tablet twice a day Week 3: 2 tablets each morning and 1 tablet each evening Week 4: 2 tablets twice a day  GOALS: -80 g protein daily -20-25 g fiber daily -64 oz fluid daily

## 2023-03-24 NOTE — Assessment & Plan Note (Signed)
Patient has been unsuccessful in weight management with diet and exercise alone.  Her exercise is also limited by back pain that is exacerbated by weight, which creates a vicious cycle.  She would greatly benefit from therapy with naltrexone and bupropion to reduce weight and better control hypertension, hyperlipidemia, prevent prediabetes from becoming diabetes, and improve obstructive sleep apnea.  Reduced weight would also alleviate pain on her back, hips, and knees allowing her improved mobility and the ability to get routine physical activity pain-free.  We discussed mechanism of action and potential side effects.  Patient is agreeable to starting naltrexone-bupropion.  I provided written instructions regarding titrating schedule over the first month.  Answered all questions and addressed concerns from patient and her husband.  Will continue to monitor.  If there is less than 5% reduction in weight after the first 3 months, recommend changing therapy.

## 2023-03-24 NOTE — Assessment & Plan Note (Signed)
BP goal <130/80.  Cardiology recommended addition of spironolactone 25 mg daily.  Patient is not at goal in office today yet.  She will return in 2 weeks for a nurse blood pressure visit and keep an ambulatory blood pressure log until then.  Continue metoprolol 50 mg daily, spironolactone 25 mg daily.  If necessary, will contact cardiology to further adjust medication to meet blood pressure goal.

## 2023-03-31 NOTE — Telephone Encounter (Signed)
Patient has mild sleep apnea with apnea hypopnea index of 6 which means she is stopping breathing or underbreathing 6 times per hour. She has significant drops in her oxygen into the low 80s during these episodes. We can consider trial of auto-titrating CPAP 5-15cmH2O with humidification and mask fitting session if she would like to start CPAP therapy. Please place DME order if she would like to start cpap. Treatment of her sleep apnea may help her blood pressure control.  Thanks, JD

## 2023-04-03 NOTE — Telephone Encounter (Signed)
Patient is returning call to find out her results. She can be reached at [phone]

## 2023-04-03 NOTE — Telephone Encounter (Signed)
Lm x1 for pt.

## 2023-04-04 NOTE — Telephone Encounter (Signed)
Called patient but he did not answer. Left message for her to call us back on Monday morning.

## 2023-04-06 ENCOUNTER — Telehealth: Payer: Self-pay | Admitting: Pulmonary Disease

## 2023-04-06 NOTE — Telephone Encounter (Signed)
Pt returning missed call. 

## 2023-04-06 NOTE — Telephone Encounter (Signed)
PT calling Margie back. Pls call @ [phone]

## 2023-04-07 ENCOUNTER — Other Ambulatory Visit (HOSPITAL_COMMUNITY): Payer: Self-pay

## 2023-04-07 ENCOUNTER — Telehealth: Payer: Self-pay

## 2023-04-07 ENCOUNTER — Ambulatory Visit (INDEPENDENT_AMBULATORY_CARE_PROVIDER_SITE_OTHER): Payer: Medicare Other | Admitting: Family Medicine

## 2023-04-07 VITALS — BP 138/64 | HR 81 | Ht 62.0 in | Wt 195.0 lb

## 2023-04-07 DIAGNOSIS — I10 Essential (primary) hypertension: Secondary | ICD-10-CM

## 2023-04-07 MED ORDER — BUPROPION HCL ER (SR) 100 MG PO TB12: 100.0000 mg | ORAL_TABLET | Freq: Two times a day (BID) | ORAL | 3 refills | Status: DC

## 2023-04-07 MED ORDER — NALTREXONE HCL (PAIN) 4.5 MG PO CAPS
4.5000 mg | ORAL_CAPSULE | Freq: Every day | ORAL | 3 refills | Status: DC
Start: 2023-04-07 — End: 2023-04-07

## 2023-04-07 MED ORDER — NALTREXONE HCL (PAIN) 4.5 MG PO CAPS
4.5000 mg | ORAL_CAPSULE | Freq: Every day | ORAL | 3 refills | Status: DC
Start: 2023-04-07 — End: 2023-04-28
  Filled 2023-04-07: qty 30, fill #0

## 2023-04-07 NOTE — Telephone Encounter (Signed)
Patient has been informed of the medication change. Patient verbalized understanding. All questions and concerns have been addressed.

## 2023-04-07 NOTE — Telephone Encounter (Signed)
Patient came in for BP check and stated that she spoke with her insurance carrier and was informed that her insurance might cover the generic weight loss medication. Patient was informed that provider did not prescribe brand only medication.

## 2023-04-07 NOTE — Telephone Encounter (Signed)
Called and spoke to patient. She is aware of need for f/u 31-90 days after cpap setup.  She will call back to make appt once setup on cpap. Order has been placed. Nothing further needed.

## 2023-04-07 NOTE — Telephone Encounter (Signed)
Pharmacy called to advise provider that the Naltrexone HCl, Pain, 4.5 MG CAPS only comes in 50 mg CAPS. She suggested maybe a compound pharmacy.

## 2023-04-07 NOTE — Telephone Encounter (Signed)
Medicare will not cover Contrave and the combination pill is not available and its generic form.  Instead, I am prescribing separate naltrexone 4.5 mg capsule to take 1 time a day, and bupropion 100 mg tablets to take twice a day.

## 2023-04-07 NOTE — Progress Notes (Signed)
Pt denies CP, SOB, dizziness, or heart palpitations. taking meds as directed without problems. Denies med side effects. 5 min spent with pt. 

## 2023-04-07 NOTE — Telephone Encounter (Signed)
I will try sending to [facility] pharmacy first, then if necessary will send to compounding pharmacy.

## 2023-04-07 NOTE — Telephone Encounter (Signed)
I read HST results and she would like to move forward with CPAP therapy. Please order.

## 2023-04-08 ENCOUNTER — Other Ambulatory Visit (HOSPITAL_COMMUNITY): Payer: Self-pay

## 2023-04-09 NOTE — Telephone Encounter (Signed)
Janice Pace, CMA      04/07/23 11:58 AM Note Called and spoke to patient. She is aware of need for f/u 31-90 days after cpap setup.  She will call back to make appt once setup on cpap. Order has been placed. Nothing further needed.

## 2023-04-27 NOTE — Progress Notes (Unsigned)
Cardiology Office Note:  .   Date:  04/28/2023  ID:  Janice Pace, DOB Jan 06, 1946, MRN [id number] PCP: Melida Quitter, PA  [facility] Providers Cardiologist:  None    History of Present Illness: .   Janice Pace is a 77 y.o. female patient with hypercholesterolemia, COPD with centrilobular emphysema from prior tobacco use (quit 2014), obesity, chronic back pain, prior lumbosacral spinal fixation, severe triple-vessel coronary artery calcification noted on the CT scan including left main disease last seen by me on 02/25/2023 for worsening dyspnea and leg edema underwent cardiac evaluation including stress testing, carotid artery duplex and was also started on spironolactone, now presents for follow-up.  Discussed the use of AI scribe software for clinical note transcription with the patient, who gave verbal consent to proceed.  History of Present Illness   Ms. Janice Pace, a patient with a history of back pain and obesity, presents for a follow-up visit. She reports that her physical activity has been limited due to her back pain. Despite her limited physical activity, she has lost seven pounds, which she attributes to her diet and the naltrexone medication she has been taking. She reports that the medication has been effective in reducing her appetite and she takes it twice daily, once in the morning and once at night. She expresses satisfaction with the medication and requests an increase in the dosage to further aid her weight loss.  In addition to her back pain and weight loss, Ms. Janice Pace also reports that she recently underwent a sleep study. The results of the study indicated that she stops breathing six times an hour, and she will be starting CPAP therapy. She denies any current issues with leg swelling or chest pain. She also reports that she quit smoking in 2014 after being advised that it would hinder the healing of her back.      Review of Systems  Cardiovascular:  Negative  for chest pain, dyspnea on exertion and leg swelling.  Musculoskeletal:  Positive for back pain.   Risk Assessment/Calculations:     Lab Results  Component Value Date   CHOL 115 02/11/2023   HDL 44 02/11/2023   LDLCALC 51 02/11/2023   TRIG 109 02/11/2023   CHOLHDL 2.6 02/11/2023   Lab Results  Component Value Date   NA 141 03/13/2023   K 4.4 03/13/2023   CO2 23 03/13/2023   GLUCOSE 106 (H) 03/13/2023   BUN 10 03/13/2023   CREATININE 0.78 03/13/2023   CALCIUM 8.8 03/13/2023   EGFR 79 03/13/2023   GFRNONAA >60 09/12/2022   Lab Results  Component Value Date   WBC 4.0 12/02/2022   HGB 12.7 12/02/2022   HCT 38.5 12/02/2022   MCV 88 12/02/2022   PLT 221 12/02/2022   Physical Exam:   VS:  BP 134/68 (BP Location: Left Arm, Patient Position: Sitting, Cuff Size: Large)   Pulse 66   Resp 16   Ht 5\' 2"  (1.575 m)   Wt 189 lb 12.8 oz (86.1 kg)   SpO2 98%   BMI 34.71 kg/m    Wt Readings from Last 3 Encounters:  04/28/23 189 lb 12.8 oz (86.1 kg)  04/07/23 195 lb (88.5 kg)  03/24/23 195 lb (88.5 kg)     Physical Exam Constitutional:      Appearance: She is obese.  Neck:     Vascular: Carotid bruit (bilateral) present. No JVD.  Cardiovascular:     Rate and Rhythm: Normal rate and regular rhythm.  Pulses: Intact distal pulses.     Heart sounds: Normal heart sounds. No murmur heard.    No gallop.  Pulmonary:     Effort: Pulmonary effort is normal.     Breath sounds: Normal breath sounds.  Abdominal:     General: Bowel sounds are normal.     Palpations: Abdomen is soft.  Musculoskeletal:     Right lower leg: No edema.     Left lower leg: No edema.     Studies Reviewed: Marland Kitchen    EKG:         LCS CT Chest 12/23/22 1. Lung-RADS 2S, benign appearance or behavior. Continue annual screening with low-dose chest CT without contrast in 12 months. 2. Aortic atherosclerosis, in addition to left main and three-vessel coronary artery disease. Please note that although the  presence of coronary artery calcium documents the presence of coronary artery disease, the severity of this disease and any potential stenosis cannot be assessed on this non-gated CT examination. Assessment for potential risk factor modification, dietary therapy or pharmacologic therapy may be warranted, if clinically indicated. 3. Mild diffuse bronchial wall thickening with very mild centrilobular and paraseptal emphysema; imaging findings suggestive of underlying COPD. 4. There are calcifications of the aortic valve. Echocardiographic correlation for evaluation of potential valvular dysfunction may be warranted if clinically indicated  Echocardiogram 02/20/2023:    1. Left ventricular ejection fraction, by estimation, is 70 to 75%. The left ventricle has hyperdynamic function. The left ventricle has no regional wall motion abnormalities. There is mild left ventricular hypertrophy. Left ventricular diastolic  parameters were normal.  2. Right ventricular systolic function is normal. The right ventricular size is normal.  3. The mitral valve is normal in structure. Trivial mitral valve regurgitation.  4. The aortic valve was not well visualized. Aortic valve regurgitation is trivial. No aortic stenosis is present.  5. The inferior vena cava is normal in size with greater than 50% respiratory variability, suggesting right atrial pressure of 3 mmHg.  Regadenoson Nuclear stress test 03/02/2023: Myocardial perfusion is normal. Overall LV systolic function is normal without regional wall motion abnormalities. Stress LV EF: 62%. Nondiagnostic ECG stress. The heart rate response was consistent with Regadenoson. No significant change from  05/29/2021 . Low risk.  Carotid artery duplex 03/12/2023: Duplex suggests stenosis in the right internal carotid artery (minimal). Duplex suggests stenosis in the left internal carotid artery (minimal). Antegrade right vertebral artery flow. Antegrade left  vertebral artery flow.   ASSESSMENT AND PLAN: .      ICD-10-CM   1. Atherosclerosis of native coronary artery of native heart without angina pectoris  I25.10     2. Primary hypertension  I10     3. Bilateral carotid bruits  R09.89     4. Class 1 obesity due to excess calories without serious comorbidity with body mass index (BMI) of 34.0 to 34.9 in adult  E66.811 naltrexone (DEPADE) 50 MG tablet   E66.09    Z68.34       Assessment and Plan    Obesity Significant weight loss achieved with Naltrexone and diet modification. Limited physical activity due to back pain. -Increase Naltrexone to 25mg  daily. -Encourage continuation of diet modification and weight loss. -Recommend simple chair and bed exercises to strengthen back. -Consider obtaining stationary bicycle for home use.  Back Pain Likely exacerbated by obesity. -Continue weight loss efforts to reduce strain on back. -Implement simple chair and bed exercises to strengthen back.  Sleep Apnea Patient reports stopping breathing  six times per hour during sleep study. -She is waiting on initiation of CPAP therapy.  Coronary Atherosclerosis Well controlled with Rosuvastatin 10mg  daily, aspirin 81 mg daily, her chronic dyspnea has remained stable in fact has improved since weight loss.  She has had a low risk nuclear stress test.  Blood pressure is also well-controlled. -Continue Rosuvastatin 10mg  daily.  Hypertension Well controlled, likely improved with weight loss and Spironolactone. -Continue current regimen.  Carotid Artery Bruit Minimal plaque noted, patient already on cholesterol medication and baby aspirin. -Continue current regimen including cholesterol medication and baby aspirin.  Follow-up as needed. For prescription refills, contact primary care provider.   Signed,  Yates Decamp, MD, [facility] 04/28/2023, 10:26 AM [facility] [address] [place], [place] [address] Phone: [phone]. Fax:   [phone]

## 2023-04-28 ENCOUNTER — Ambulatory Visit: Payer: Medicare Other | Attending: Cardiology | Admitting: Cardiology

## 2023-04-28 ENCOUNTER — Encounter: Payer: Self-pay | Admitting: Cardiology

## 2023-04-28 VITALS — BP 134/68 | HR 66 | Resp 16 | Ht 62.0 in | Wt 189.8 lb

## 2023-04-28 DIAGNOSIS — R0989 Other specified symptoms and signs involving the circulatory and respiratory systems: Secondary | ICD-10-CM | POA: Diagnosis not present

## 2023-04-28 DIAGNOSIS — E6609 Other obesity due to excess calories: Secondary | ICD-10-CM

## 2023-04-28 DIAGNOSIS — R0609 Other forms of dyspnea: Secondary | ICD-10-CM

## 2023-04-28 DIAGNOSIS — I10 Essential (primary) hypertension: Secondary | ICD-10-CM

## 2023-04-28 DIAGNOSIS — E66811 Obesity, class 1: Secondary | ICD-10-CM | POA: Diagnosis not present

## 2023-04-28 DIAGNOSIS — J432 Centrilobular emphysema: Secondary | ICD-10-CM

## 2023-04-28 DIAGNOSIS — I2510 Atherosclerotic heart disease of native coronary artery without angina pectoris: Secondary | ICD-10-CM

## 2023-04-28 DIAGNOSIS — Z6834 Body mass index (BMI) 34.0-34.9, adult: Secondary | ICD-10-CM

## 2023-04-28 MED ORDER — NALTREXONE HCL 50 MG PO TABS
25.0000 mg | ORAL_TABLET | Freq: Every day | ORAL | 0 refills | Status: DC
Start: 2023-04-28 — End: 2023-05-19

## 2023-04-28 NOTE — Patient Instructions (Signed)
Medication Instructions:  Your physician has recommended you make the following change in your medication:   1) START naltrexone 25 mg daily  *If you need a refill on your cardiac medications before your next appointment, please call your pharmacy*  Lab Work: None ordered today.  Testing/Procedures: None ordered today.  Follow-Up: As needed

## 2023-05-04 ENCOUNTER — Encounter: Payer: Self-pay | Admitting: Family Medicine

## 2023-05-04 NOTE — Progress Notes (Signed)
BP at goal in cardiology office

## 2023-05-19 ENCOUNTER — Ambulatory Visit (INDEPENDENT_AMBULATORY_CARE_PROVIDER_SITE_OTHER): Payer: Medicare Other | Admitting: Family Medicine

## 2023-05-19 ENCOUNTER — Other Ambulatory Visit: Payer: Self-pay | Admitting: Cardiology

## 2023-05-19 ENCOUNTER — Encounter: Payer: Self-pay | Admitting: Family Medicine

## 2023-05-19 VITALS — BP 124/64 | HR 62 | Resp 18 | Ht 62.0 in | Wt 189.0 lb

## 2023-05-19 DIAGNOSIS — Z23 Encounter for immunization: Secondary | ICD-10-CM

## 2023-05-19 DIAGNOSIS — I10 Essential (primary) hypertension: Secondary | ICD-10-CM | POA: Diagnosis not present

## 2023-05-19 DIAGNOSIS — R7303 Prediabetes: Secondary | ICD-10-CM | POA: Diagnosis not present

## 2023-05-19 DIAGNOSIS — M810 Age-related osteoporosis without current pathological fracture: Secondary | ICD-10-CM

## 2023-05-19 DIAGNOSIS — E782 Mixed hyperlipidemia: Secondary | ICD-10-CM | POA: Diagnosis not present

## 2023-05-19 DIAGNOSIS — Z6835 Body mass index (BMI) 35.0-35.9, adult: Secondary | ICD-10-CM

## 2023-05-19 LAB — POCT GLYCOSYLATED HEMOGLOBIN (HGB A1C): HbA1c POC (<> result, manual entry): 6.2 % (ref 4.0–5.6)

## 2023-05-19 MED ORDER — BUPROPION HCL ER (SR) 100 MG PO TB12
100.0000 mg | ORAL_TABLET | Freq: Two times a day (BID) | ORAL | 1 refills | Status: DC
Start: 2023-05-19 — End: 2023-06-25

## 2023-05-19 MED ORDER — NALTREXONE HCL 50 MG PO TABS
25.0000 mg | ORAL_TABLET | Freq: Every day | ORAL | 1 refills | Status: DC
Start: 2023-05-19 — End: 2023-06-25

## 2023-05-19 MED ORDER — ROMOSOZUMAB-AQQG 105 MG/1.17ML ~~LOC~~ SOSY
210.0000 mg | PREFILLED_SYRINGE | SUBCUTANEOUS | 11 refills | Status: DC
Start: 2023-05-19 — End: 2023-05-29

## 2023-05-19 NOTE — Assessment & Plan Note (Addendum)
BP goal <130/80.  Stable, at goal.  Continue metoprolol 50 mg daily, spironolactone 25 mg daily.  Continue ambulatory blood pressure monitoring.

## 2023-05-19 NOTE — Patient Instructions (Signed)
I have sent the prescription for the shopped for your bones to your pharmacy.  We will likely need to fill out some paperwork for it, so it may not be ready for a couple of days.  Once you do get it, bring it to your nurse visit for the injection once each month.   The current recommendation for osteoporosis treatment includes:   #1 calcium: total of 1200 mg of calcium daily.  If you eat a very calcium rich diet you may be able to obtain that without a supplement.  If not, then I recommend calcium 500 mg twice a day.  There are several products over-the-counter such as Caltrate D and Viactiv chews which are great options that contain calcium and vitamin D. #2 vitamin D: recommend 800 international units daily. #3 exercise-recommend 30 minutes of weightbearing exercise 3 days a week.  Resistance training, such as doing bands and light weights, can be particularly helpful.

## 2023-05-19 NOTE — Assessment & Plan Note (Addendum)
At last appointment, started bupropion 100 mg twice daily and lowest dose of naltrexone 25 mg daily.  Visit #2: Starting Weight: 195 lbs  Current weight: 189 lbs Previous weight: 195 lbs Change in weight: -6 lbs total (3.1% starting weight) Goals: Control hypertension and, hyperlipidemia, prediabetes.  Improve OSA, alleviate joint pain to improve mobility. Medication: Continue naltrexone 25 mg daily, Wellbutrin 100 mg twice daily Follow-up and referrals: follow up in 5 weeks to assess progress and adjust routine as needed

## 2023-05-19 NOTE — Assessment & Plan Note (Addendum)
DEXA scan has not previously been completed, first scan on 11/07/2022 with T-score -3.2.  T-score -3.2 meets criteria for recommendation to start anabolic therapy like Evenity for T-score less than 3.0 as this indicates high risk of fracture. Start Evenity monthly injections for a year followed by Prolia injections every 6 months or a weekly bisphosphonate after that.  Patient agreeable to starting Evenity monthly injections.  Prescription has been ordered, patient will have nurse visit for injections once a month. [place] [employer] Osteoporosis guidelines regarding Evenity: "The best candidates are women at very high risk of fracture, including those with prior and especially recent fractures, very low BMD (T-score below 3.0), and those who sustain fractures or lose BMD while taking antiremodeling therapy."  Vitamin D low at 26.7.  Start vitamin D 50,000 units once weekly for 12 weeks, then switch to over-the-counter vitamin D3 daily supplement.  Calcium within normal limits.

## 2023-05-19 NOTE — Assessment & Plan Note (Signed)
Last A1c 6.2, A1c today stable at 6.2.  Will continue to monitor.

## 2023-05-19 NOTE — Progress Notes (Addendum)
Established Patient Office Visit  Subjective   Patient ID: Janice Pace, female    DOB: 07-02-1946  Age: 77 y.o. MRN: [id number]  Chief Complaint  Patient presents with   Hyperlipidemia   Hypertension   Osteoporosis   Weight Management Screening    HPI Janice Pace is a 77 y.o. female presenting today for follow up of hypertension, hyperlipidemia, weight management, osteoporosis. Hypertension:  Pt denies chest pain, SOB, dizziness, edema, syncope, fatigue or heart palpitations. Taking metoprolol succinate and spironolactone, reports excellent compliance with treatment. Denies side effects.  Also followed by cardiology, last appointment 04/28/2023.  Blood pressure well-controlled at that visit at 134/68. Hyperlipidemia: tolerating rosuvastatin 10 mg daily well with no myalgias or significant side effects.  The ASCVD Risk score (Arnett DK, et al., 2019) failed to calculate for the following reasons:   The valid total cholesterol range is 130 to 320 mg/dL Weight management: Weight has decreased 6 lbs since last visit.  At her last appointment, discussed weight management with naltrexone and bupropion in agreement with recommendations from her cardiologist.  Cardiologist increased dose of naltrexone at last appointment.  She is currently taking naltrexone and bupropion as 2 separate prescriptions, insurance would not pay for the combination medication.  This has been going well, denies side effects.  She has noticed a decrease in appetite and that portion control is easier.  Goals are to reduce weight for better control of hypertension/hyperlipidemia, prevent prediabetes from becoming diabetes, and improve obstructive sleep apnea. Reduced weight would also alleviate pain on her back, hips, and knees allowing her improved mobility and the ability to get routine physical activity pain-free.  Osteoporosis: Not currently taking calcium or vitamin D supplements.   Outpatient Medications Prior to  Visit  Medication Sig   albuterol (VENTOLIN HFA) 108 (90 Base) MCG/ACT inhaler Inhale 1-2 puffs into the lungs every 6 (six) hours as needed for wheezing or shortness of breath.   aspirin 81 MG EC tablet Take 1 tablet by mouth daily.   budesonide (PULMICORT) 0.5 MG/2ML nebulizer solution Generic for Pulmicort. Inhale one vial in nebulizer twice a day. Rinse mouth after use.   docusate sodium (COLACE) 100 MG capsule Take 500 mg by mouth at bedtime.   formoterol (PERFOROMIST) 20 MCG/2ML nebulizer solution Substituted for: Perforomist Solution Inhale one vial in nebulizer twice a day.   gabapentin (NEURONTIN) 300 MG capsule Take 1-2 capsules (300-600 mg total) by mouth 3 (three) times daily as needed (Nerve pain). (Patient taking differently: Take 300 mg by mouth at bedtime.)   metoprolol succinate (TOPROL-XL) 50 MG 24 hr tablet Take 1 tablet (50 mg total) by mouth daily. Take with or immediately following a meal.   omeprazole (PRILOSEC) 20 MG capsule Take 1 capsule (20 mg total) by mouth daily as needed (acid reflux).   revefenacin (YUPELRI) 175 MCG/3ML nebulizer solution Take 3 mLs (175 mcg total) by nebulization daily.   rosuvastatin (CRESTOR) 10 MG tablet Take 1 tablet (10 mg total) by mouth daily.   spironolactone (ALDACTONE) 25 MG tablet TAKE 1 TABLET BY MOUTH ONCE DAILY IN THE MORNING   [DISCONTINUED] buPROPion ER (WELLBUTRIN SR) 100 MG 12 hr tablet Take 1 tablet (100 mg total) by mouth 2 (two) times daily. START: 1 tab daily for week 1, then 1 tab BID   [DISCONTINUED] chlordiazePOXIDE (LIBRIUM) 10 MG capsule Take 1 capsule (10 mg total) by mouth 2 (two) times daily as needed for anxiety.   [DISCONTINUED] naltrexone (DEPADE) 50 MG tablet  Take 0.5 tablets (25 mg total) by mouth daily.   [DISCONTINUED] ascorbic acid (VITAMIN C) 1000 MG tablet Take 1 tablet by mouth daily.   No facility-administered medications prior to visit.    ROS Negative unless otherwise noted in HPI   Objective:      BP 124/64 Comment: home BP  Pulse 62   Resp 18   Ht 5\' 2"  (1.575 m)   Wt 189 lb (85.7 kg)   SpO2 96%   BMI 34.57 kg/m   Physical Exam Constitutional:      General: She is not in acute distress.    Appearance: Normal appearance.  HENT:     Head: Normocephalic and atraumatic.  Cardiovascular:     Rate and Rhythm: Normal rate and regular rhythm.     Heart sounds: No murmur heard.    No friction rub. No gallop.  Pulmonary:     Effort: Pulmonary effort is normal. No respiratory distress.     Breath sounds: No wheezing, rhonchi or rales.  Skin:    General: Skin is warm and dry.  Neurological:     Mental Status: She is alert and oriented to person, place, and time.      Assessment & Plan:  Essential hypertension Assessment & Plan: BP goal <130/80.  Stable, at goal.  Continue metoprolol 50 mg daily, spironolactone 25 mg daily.  Continue ambulatory blood pressure monitoring.   Mixed hyperlipidemia Assessment & Plan: Last lipid panel: LDL 51, HDL 44, triglycerides 109.  Most recent AST and ALT elevated but stable from last check consistent with nonalcoholic fatty liver.  Continue rosuvastatin 10 mg daily.   Will continue to monitor.   Prediabetes Assessment & Plan: Last A1c 6.2, A1c today stable at 6.2.  Will continue to monitor.  Orders: -     POCT glycosylated hemoglobin (Hb A1C)  Severe obesity (BMI 35.0-35.9 with comorbidity) (HCC) Assessment & Plan: At last appointment, started bupropion 100 mg twice daily and lowest dose of naltrexone 25 mg daily.  Visit #2: Starting Weight: 195 lbs  Current weight: 189 lbs Previous weight: 195 lbs Change in weight: -6 lbs total (3.1% starting weight) Goals: Control hypertension and, hyperlipidemia, prediabetes.  Improve OSA, alleviate joint pain to improve mobility. Medication: Continue naltrexone 25 mg daily, Wellbutrin 100 mg twice daily Follow-up and referrals: follow up in 5 weeks to assess progress and adjust routine  as needed  Orders: -     Naltrexone HCl; Take 0.5 tablets (25 mg total) by mouth daily.  Dispense: 45 tablet; Refill: 1 -     buPROPion HCl ER (SR); Take 1 tablet (100 mg total) by mouth 2 (two) times daily. START: 1 tab daily for week 1, then 1 tab BID  Dispense: 180 tablet; Refill: 1  Age-related osteoporosis without current pathological fracture Assessment & Plan: DEXA scan has not previously been completed, first scan on 11/07/2022 with T-score -3.2.  T-score -3.2 meets criteria for recommendation to start anabolic therapy like Evenity for T-score less than 3.0 as this indicates high risk of fracture. Start Evenity monthly injections for a year followed by Prolia injections every 6 months or a weekly bisphosphonate after that.  Patient agreeable to starting Evenity monthly injections.  Prescription has been ordered, patient will have nurse visit for injections once a month. [place] [employer] Osteoporosis guidelines regarding Evenity: "The best candidates are women at very high risk of fracture, including those with prior and especially recent fractures, very low BMD (T-score below 3.0),  and those who sustain fractures or lose BMD while taking antiremodeling therapy."  Vitamin D low at 26.7.  Start vitamin D 50,000 units once weekly for 12 weeks, then switch to over-the-counter vitamin D3 daily supplement.  Calcium within normal limits.  Orders: -     Basic metabolic panel; Future -     VITAMIN D 25 Hydroxy (Vit-D Deficiency, Fractures); Future -     Romosozumab-aqqg; Inject 210 mg into the skin every 30 (thirty) days.  Dispense: 2.4 mL; Refill: 11 -     Romosozumab-aqqg  High risk for fracture due to osteoporosis by DEXA scan -     Romosozumab-aqqg; Inject 210 mg into the skin every 30 (thirty) days.  Dispense: 2.4 mL; Refill: 11  Need for influenza vaccination -     Flu Vaccine Trivalent High Dose (Fluad)  Primary hypertension    Return in about 5 weeks (around  06/23/2023) for follow-up for weight management and second Evenity injection.    Melida Quitter, PA

## 2023-05-19 NOTE — Assessment & Plan Note (Signed)
Last lipid panel: LDL 51, HDL 44, triglycerides 109.  Most recent AST and ALT elevated but stable from last check consistent with nonalcoholic fatty liver.  Continue rosuvastatin 10 mg daily.   Will continue to monitor.

## 2023-05-20 ENCOUNTER — Ambulatory Visit (INDEPENDENT_AMBULATORY_CARE_PROVIDER_SITE_OTHER): Payer: Medicare Other | Admitting: Pulmonary Disease

## 2023-05-20 ENCOUNTER — Encounter: Payer: Self-pay | Admitting: Pulmonary Disease

## 2023-05-20 ENCOUNTER — Other Ambulatory Visit: Payer: Self-pay | Admitting: Family Medicine

## 2023-05-20 VITALS — BP 125/79 | HR 69 | Ht 62.0 in | Wt 189.4 lb

## 2023-05-20 DIAGNOSIS — G4733 Obstructive sleep apnea (adult) (pediatric): Secondary | ICD-10-CM

## 2023-05-20 DIAGNOSIS — E559 Vitamin D deficiency, unspecified: Secondary | ICD-10-CM

## 2023-05-20 LAB — BASIC METABOLIC PANEL
BUN/Creatinine Ratio: 9 — ABNORMAL LOW (ref 12–28)
BUN: 7 mg/dL — ABNORMAL LOW (ref 8–27)
CO2: 23 mmol/L (ref 20–29)
Calcium: 9.4 mg/dL (ref 8.7–10.3)
Chloride: 107 mmol/L — ABNORMAL HIGH (ref 96–106)
Creatinine, Ser: 0.82 mg/dL (ref 0.57–1.00)
Glucose: 102 mg/dL — ABNORMAL HIGH (ref 70–99)
Potassium: 4.5 mmol/L (ref 3.5–5.2)
Sodium: 144 mmol/L (ref 134–144)
eGFR: 74 mL/min/{1.73_m2} (ref >59)

## 2023-05-20 LAB — VITAMIN D 25 HYDROXY (VIT D DEFICIENCY, FRACTURES): Vit D, 25-Hydroxy: 26.7 ng/mL — ABNORMAL LOW (ref 30.0–100.0)

## 2023-05-20 MED ORDER — VITAMIN D (ERGOCALCIFEROL) 1.25 MG (50000 UNIT) PO CAPS: 50000.0000 [IU] | ORAL_CAPSULE | ORAL | 0 refills | Status: DC

## 2023-05-20 NOTE — Progress Notes (Signed)
Synopsis: Referred in December 2022 for shortness of breath  Subjective:   PATIENT ID: Janice Pace GENDER: female DOB: 07-24-1945, MRN: [id number]  HPI  Chief Complaint  Patient presents with   Follow-up    Pt is here for CT F/U visit.    Janice Pace is a 77 year old woman, former smoker with GERD chronic back pain s/p anterior lateral interbody fusion of L2-3 and 3-4 with posterior spinal fixation in 2022 who returns to pulmonary clinic for COPD and OSA.   She has been using a CPAP machine for at least four hours every night. However, she reports discomfort with the machine and often wakes up to remove it. She has not noticed any significant improvement in energy levels or reduction in daytime fatigue since starting the CPAP therapy. She also reports general sleep issues, often waking up in the middle of the night and staying awake for several hours.  In addition to the CPAP therapy, she has been working on weight loss and has lost eleven pounds since the sleep study was conducted. She is on medication to assist with weight loss and reports feeling a little better with her breathing since losing weight. She is also on Pulmicort, Perforomist, and Yupelri for her breathing issues and reports no need for albuterol.  OV 02/04/23 She complains of increase in exertional dyspnea, sweating and fatigue. For example after getting bathed and dressed it takes her a few minutes to recover. She denies chest pain, jaw pain or arm pain. She is using performist and budesonide nebs twice daily with as needed albuterol. She does snore at night and will wake her self up due to snoring. She reports labile BP readings.   OV 07/30/22 She was seen by Rhunette Croft, NP 05/06/22 and changed to breztri inhaler therapy at that time. She was on breo previously. She stopped breztri because she felt it gave her pneumonia. She had lung cancer screening CT Chest done with 5.31mm RUL nodule that is new and a 6 month follow up  scan ordered.   She is waiting on performist and budesonide nebs this week. She just received her nebulizer machine.   She has been stable since her visit with Rhunette Croft, NP.   OV 02/03/22 She had reduced and increased RV on PFTs indicating air trapping.   She has been using breo about 3 days per week when her husband reminds her. She does not notice much change in her breathing when using it. She denies issues with cough or wheezing. She denies night time awakenings. She continues to have exertional dyspnea which is no new.   She is asking if a nebulizer regimen would be better than using the breo inhaler.   OV 08/07/21 She reports her breathing is much better after starting Breo Ellipta 100-25 MCG 1 puff daily.  She is not noticing the cough and wheezing like before.  She reports her shortness of breath has improved as well.  She is accompanied by her husband today.  They are planning a motorcycle trip out [place] in [place] on their [place] motorcycle.  OV 06/20/21 She reports having shortness of breath since her back surgery in August.  The dyspnea is worse with exertion.  She has intermittent cough and wheezing.  She had intermittent wheezing prior to the surgery.  Otherwise she denies significant dyspnea before her hospitalization as she was doing water aerobics on a regular basis.  She has resumed water aerobics but is going a  couple days of the week and is noticing that her stamina is improving.  She has been evaluated by Dr. Jeanella Cara of cardiology with normal exercise stress test and no ischemic changes but reduced exercise capacity for her age.  Echocardiogram 04/16/2021 showed moderate concentric LVH with LVEF 55 to 60%.  Grade 1 diastolic dysfunction.  Trace tricuspid regurgitation.  She is a former smoker with 50+ pack year smoking history.  She smoked from age [age].  Past Medical History:  Diagnosis Date   Arthritis    Asthma    Bronchitis    Cancer (HCC)    dermatosivros  arcoma and protuverans   Chronic back pain    degenerative disc disease   Constipation    stool softener daily   Constipation    COPD (chronic obstructive pulmonary disease) (HCC)    GERD (gastroesophageal reflux disease)    takes Prilosec daily   GERD (gastroesophageal reflux disease)    Heart murmur    Heart valve disorder    Hemorrhoids    History of hiatal hernia    Insomnia    d/t chantix   Joint pain    Nocturia    Personal history of colonic adenoma 03/13/2003   03/13/2003 - 5 mm adenoma   Pre-diabetes    Prediabetes    White coat syndrome without diagnosis of hypertension      Family History  Problem Relation Age of Onset   Leukemia Mother    Anesthesia problems Neg Hx    Hypotension Neg Hx    Malignant hyperthermia Neg Hx    Pseudochol deficiency Neg Hx    Colon cancer Neg Hx    Colon polyps Neg Hx    Esophageal cancer Neg Hx    Stomach cancer Neg Hx    Rectal cancer Neg Hx      Social History   Socioeconomic History   Marital status: Married    Spouse name: david   Number of children: 3   Years of education: Not on file   Highest education level: GED or equivalent  Occupational History   Occupation: retired  Tobacco Use   Smoking status: Former    Current packs/day: 0.00    Average packs/day: 1 pack/day for 50.0 years (50.0 ttl pk-yrs)    Types: Cigarettes    Start date: 08/16/1961    Quit date: 08/17/2011    Years since quitting: 11.7    Passive exposure: Never   Smokeless tobacco: Never  Vaping Use   Vaping status: Never Used  Substance and Sexual Activity   Alcohol use: No    Comment: occasional wine   Drug use: No   Sexual activity: Yes    Birth control/protection: Surgical, Other-see comments  Other Topics Concern   Not on file  Social History Narrative   ** Merged History Encounter **       Social Determinants of Health   Financial Resource Strain: Low Risk  (05/17/2023)   Overall Financial Resource Strain (CARDIA)    Difficulty  of Paying Living Expenses: Not hard at all  Food Insecurity: No Food Insecurity (05/17/2023)   Hunger Vital Sign    Worried About Running Out of Food in the Last Year: Never true    Ran Out of Food in the Last Year: Never true  Transportation Needs: No Transportation Needs (05/17/2023)   PRAPARE - Administrator, Civil Service (Medical): No    Lack of Transportation (Non-Medical): No  Physical Activity: Inactive (05/17/2023)  Exercise Vital Sign    Days of Exercise per Week: 0 days    Minutes of Exercise per Session: 0 min  Stress: No Stress Concern Present (05/17/2023)   [employer] of Occupational Health - Occupational Stress Questionnaire    Feeling of Stress : Not at all  Social Connections: Socially Integrated (05/17/2023)   Social Connection and Isolation Panel [NHANES]    Frequency of Communication with Friends and Family: More than three times a week    Frequency of Social Gatherings with Friends and Family: More than three times a week    Attends Religious Services: More than 4 times per year    Active Member of [employer] or Organizations: Yes    Attends Engineer, structural: More than 4 times per year    Marital Status: Married  Catering manager Violence: Not At Risk (05/28/2022)   Humiliation, Afraid, Rape, and Kick questionnaire    Fear of Current or Ex-Partner: No    Emotionally Abused: No    Physically Abused: No    Sexually Abused: No     Allergies  Allergen Reactions   Sulfa Antibiotics Hives and Itching   Atorvastatin Other (See Comments)    mucsle aches   Metformin Hcl Other (See Comments)    joint pains and muscle aches     Outpatient Medications Prior to Visit  Medication Sig Dispense Refill   albuterol (VENTOLIN HFA) 108 (90 Base) MCG/ACT inhaler Inhale 1-2 puffs into the lungs every 6 (six) hours as needed for wheezing or shortness of breath. 8 g 1   aspirin 81 MG EC tablet Take 1 tablet by mouth daily.     budesonide (PULMICORT)  0.5 MG/2ML nebulizer solution Generic for Pulmicort. Inhale one vial in nebulizer twice a day. Rinse mouth after use. 60 mL 11   buPROPion ER (WELLBUTRIN SR) 100 MG 12 hr tablet Take 1 tablet (100 mg total) by mouth 2 (two) times daily. START: 1 tab daily for week 1, then 1 tab BID 180 tablet 1   docusate sodium (COLACE) 100 MG capsule Take 500 mg by mouth at bedtime.     formoterol (PERFOROMIST) 20 MCG/2ML nebulizer solution Substituted for: Perforomist Solution Inhale one vial in nebulizer twice a day. 60 mL 11   gabapentin (NEURONTIN) 300 MG capsule Take 1-2 capsules (300-600 mg total) by mouth 3 (three) times daily as needed (Nerve pain). (Patient taking differently: Take 300 mg by mouth at bedtime.) 180 capsule 2   metoprolol succinate (TOPROL-XL) 50 MG 24 hr tablet Take 1 tablet (50 mg total) by mouth daily. Take with or immediately following a meal. 90 tablet 1   naltrexone (DEPADE) 50 MG tablet Take 0.5 tablets (25 mg total) by mouth daily. 45 tablet 1   omeprazole (PRILOSEC) 20 MG capsule Take 1 capsule (20 mg total) by mouth daily as needed (acid reflux). 90 capsule 0   revefenacin (YUPELRI) 175 MCG/3ML nebulizer solution Take 3 mLs (175 mcg total) by nebulization daily.     Romosozumab-aqqg (EVENITY) 105 MG/1. SOSY injection Inject 210 mg into the skin every 30 (thirty) days. 2.4 mL 11   rosuvastatin (CRESTOR) 10 MG tablet Take 1 tablet (10 mg total) by mouth daily. 90 tablet 1   spironolactone (ALDACTONE) 25 MG tablet TAKE 1 TABLET BY MOUTH ONCE DAILY IN THE MORNING 90 tablet 3   Vitamin D, Ergocalciferol, (DRISDOL) 1.25 MG (50000 UNIT) CAPS capsule Take 1 capsule (50,000 Units total) by mouth every 7 (seven) days. 12 capsule  0   No facility-administered medications prior to visit.   Review of Systems  Constitutional:  Negative for chills, fever, malaise/fatigue and weight loss.  HENT:  Negative for congestion, sinus pain and sore throat.   Eyes: Negative.   Respiratory:  Negative  for cough, hemoptysis, sputum production, shortness of breath and wheezing.   Cardiovascular:  Negative for chest pain, palpitations, orthopnea, claudication and leg swelling.  Gastrointestinal:  Negative for abdominal pain, heartburn, nausea and vomiting.  Genitourinary: Negative.   Musculoskeletal:  Negative for joint pain and myalgias.  Skin:  Negative for rash.  Neurological:  Negative for weakness.  Endo/Heme/Allergies: Negative.   Psychiatric/Behavioral: Negative.     Objective:   Vitals:   05/20/23 1113  BP: 125/79  Pulse: 69  SpO2: 97%  Weight: 189 lb 6.4 oz (85.9 kg)  Height: 5\' 2"  (1.575 m)    Physical Exam Constitutional:      General: She is not in acute distress.    Appearance: She is obese. She is not ill-appearing.  HENT:     Head: Normocephalic and atraumatic.  Eyes:     General: No scleral icterus.    Conjunctiva/sclera: Conjunctivae normal.  Cardiovascular:     Rate and Rhythm: Normal rate and regular rhythm.     Pulses: Normal pulses.     Heart sounds: Normal heart sounds. No murmur heard. Pulmonary:     Effort: Pulmonary effort is normal.     Breath sounds: Decreased air movement present. No decreased breath sounds, wheezing, rhonchi or rales.  Musculoskeletal:     Right lower leg: No edema.     Left lower leg: No edema.  Skin:    General: Skin is warm and dry.  Neurological:     General: No focal deficit present.     Mental Status: She is alert.    CBC    Component Value Date/Time   WBC 4.0 12/02/2022 0936   WBC 6.9 09/12/2022 0825   RBC 4.38 12/02/2022 0936   RBC 4.27 09/12/2022 0825   HGB 12.7 12/02/2022 0936   HCT 38.5 12/02/2022 0936   PLT 221 12/02/2022 0936   MCV 88 12/02/2022 0936   MCH 29.0 12/02/2022 0936   MCH 31.6 09/12/2022 0825   MCHC 33.0 12/02/2022 0936   MCHC 34.5 09/12/2022 0825   RDW 13.5 12/02/2022 0936   LYMPHSABS 1.5 12/02/2022 0936   MONOABS 1.4 (H) 03/01/2021 0903   EOSABS 0.1 12/02/2022 0936   BASOSABS 0.0  12/02/2022 0936      Latest Ref Rng & Units 05/19/2023    9:09 AM 03/13/2023    9:16 AM 02/11/2023    8:45 AM  BMP  Glucose 70 - 99 mg/dL 829  562  130   BUN 8 - 27 mg/dL 7  10  9    Creatinine 0.57 - 1.00 mg/dL 8.65  7.84  6.96   BUN/Creat Ratio 12 - 28 9  13  11    Sodium 134 - 144 mmol/L 144  141  144   Potassium 3.5 - 5.2 mmol/L 4.5  4.4  4.8   Chloride 96 - 106 mmol/L 107  106  106   CO2 20 - 29 mmol/L 23  23  23    Calcium 8.7 - 10.3 mg/dL 9.4  8.8  9.3    Chest imaging: LCS CT Chest 12/23/22 1. Lung-RADS 2S, benign appearance or behavior. Continue annual screening with low-dose chest CT without contrast in 12 months. 2. The "S" modifier above refers  to potentially clinically significant non lung cancer related findings. Specifically, there is aortic atherosclerosis, in addition to left main and three-vessel coronary artery disease. Please note that although the presence of coronary artery calcium documents the presence of coronary artery disease, the severity of this disease and any potential stenosis cannot be assessed on this non-gated CT examination. Assessment for potential risk factor modification, dietary therapy or pharmacologic therapy may be warranted, if clinically indicated. 3. Mild diffuse bronchial wall thickening with very mild centrilobular and paraseptal emphysema; imaging findings suggestive of underlying COPD. 4. There are calcifications of the aortic valve. Echocardiographic correlation for evaluation of potential valvular dysfunction may be warranted if clinically indicated.  LCS CT Chest Scan 06/18/22 1. Lung-RADS 3, probably benign findings. Short-term follow-up in 6 months is recommended with repeat low-dose chest CT without contrast (please use the following order, "CT CHEST [facility]CS NODULE FOLLOW-UP W/O CM"). New indistinct posterior right upper lobe nodule measuring 5.6 mm in volume derived mean diameter. 2. Three-vessel coronary atherosclerosis. 3.  Mild diffuse hepatic steatosis. 4. Stable bilateral adrenal adenomas, for which no follow-up imaging is recommended. 5. Aortic Atherosclerosis  CTA Chest 05/17/21 1. Negative examination for pulmonary embolism. 2. Diffuse bilateral bronchial wall thickening and mild mosaic attenuation of the airspaces throughout. Findings are consistent with nonspecific infectious or inflammatory bronchitis and small airways disease. 3. Minimal paraseptal emphysema. 4. Background of very fine centrilobular pulmonary nodules, most concentrated at the lung apices, most commonly seen in smoking-related respiratory bronchiolitis. 5. Coronary artery disease.  PFT:    Latest Ref Rng & Units 08/28/2021   10:46 AM  PFT Results  FVC-Pre L 2.21   FVC-Predicted Pre % 84   FVC-Post L 2.32   FVC-Predicted Post % 88   Pre FEV1/FVC % % 80   Post FEV1/FCV % % 80   FEV1-Pre L 1.78   FEV1-Predicted Pre % 90   FEV1-Post L 1.86   DLCO uncorrected ml/min/mmHg 20.18   DLCO UNC% % 112   DLCO corrected ml/min/mmHg 20.18   DLCO COR %Predicted % 112   DLVA Predicted % 109   TLC L 8.89   TLC % Predicted % 186   RV % Predicted % 294   PFT 2023: air trapping present  Labs:  Path:  Echo 04/16/21: Left ventricle cavity is normal in size. Moderate concentric hypertrophy  of the left ventricle. Normal global wall motion. Normal LV systolic  function with visual EF 55-60%. Doppler evidence of grade I (impaired)  diastolic dysfunction, normal LAP.  Trace TR.  No evidence of pulmonary hypertension.  Heart Catheterization:  Assessment & Plan:   OSA (obstructive sleep apnea) - Plan: AMB REFERRAL FOR DME  Discussion: Janice Pace is a 77 year old woman, former smoker with GERD chronic back pain s/p anterior lateral interbody fusion of L2-3 and 3 4 with posterior spinal fixation in 2022 who returns to pulmonary clinic for COPD and OSA.   Obstructive Sleep Apnea (OSA) Mild OSA diagnosed via home sleep study.  Patient has been compliant with CPAP therapy but reports difficulty sleeping and would prefer to discontinue use. Patient is actively losing weight which may improve OSA severity. -Discontinue CPAP therapy. -Encourage continued weight loss. -Consider repeat home sleep study after further weight loss to reassess OSA severity.  Chronic Obstructive Pulmonary Disease (COPD) Stable, no reported cough or wheezing. Compliant with Pulmicort, Perforomist, and Yupelri nebulizers. No need for rescue inhaler. -Continue current nebulizer regimen. -Follow-up in 6 months or sooner if symptoms worsen.  Lung Cancer Screening Last low-dose CT scan in June 2024 was normal. Next scan due in June 2025. -Continue annual lung cancer screening as scheduled.  Weight Management Patient has lost 11 pounds since last visit and is on naltrexone for weight loss. No changes in diet or activity level reported. -Encourage continued weight loss efforts. -Continue naltrexone as prescribed  Follow up in 6 months.  Melody Comas, MD [facility] Office: [phone]   Current Outpatient Medications:    albuterol (VENTOLIN HFA) 108 (90 Base) MCG/ACT inhaler, Inhale 1-2 puffs into the lungs every 6 (six) hours as needed for wheezing or shortness of breath., Disp: 8 g, Rfl: 1   aspirin 81 MG EC tablet, Take 1 tablet by mouth daily., Disp: , Rfl:    budesonide (PULMICORT) 0.5 MG/2ML nebulizer solution, Generic for Pulmicort. Inhale one vial in nebulizer twice a day. Rinse mouth after use., Disp: 60 mL, Rfl: 11   buPROPion ER (WELLBUTRIN SR) 100 MG 12 hr tablet, Take 1 tablet (100 mg total) by mouth 2 (two) times daily. START: 1 tab daily for week 1, then 1 tab BID, Disp: 180 tablet, Rfl: 1   docusate sodium (COLACE) 100 MG capsule, Take 500 mg by mouth at bedtime., Disp: , Rfl:    formoterol (PERFOROMIST) 20 MCG/2ML nebulizer solution, Substituted for: Perforomist Solution Inhale one vial in nebulizer  twice a day., Disp: 60 mL, Rfl: 11   gabapentin (NEURONTIN) 300 MG capsule, Take 1-2 capsules (300-600 mg total) by mouth 3 (three) times daily as needed (Nerve pain). (Patient taking differently: Take 300 mg by mouth at bedtime.), Disp: 180 capsule, Rfl: 2   metoprolol succinate (TOPROL-XL) 50 MG 24 hr tablet, Take 1 tablet (50 mg total) by mouth daily. Take with or immediately following a meal., Disp: 90 tablet, Rfl: 1   naltrexone (DEPADE) 50 MG tablet, Take 0.5 tablets (25 mg total) by mouth daily., Disp: 45 tablet, Rfl: 1   omeprazole (PRILOSEC) 20 MG capsule, Take 1 capsule (20 mg total) by mouth daily as needed (acid reflux)., Disp: 90 capsule, Rfl: 0   revefenacin (YUPELRI) 175 MCG/3ML nebulizer solution, Take 3 mLs (175 mcg total) by nebulization daily., Disp: , Rfl:    Romosozumab-aqqg (EVENITY) 105 MG/1. SOSY injection, Inject 210 mg into the skin every 30 (thirty) days., Disp: 2.4 mL, Rfl: 11   rosuvastatin (CRESTOR) 10 MG tablet, Take 1 tablet (10 mg total) by mouth daily., Disp: 90 tablet, Rfl: 1   spironolactone (ALDACTONE) 25 MG tablet, TAKE 1 TABLET BY MOUTH ONCE DAILY IN THE MORNING, Disp: 90 tablet, Rfl: 3   Vitamin D, Ergocalciferol, (DRISDOL) 1.25 MG (50000 UNIT) CAPS capsule, Take 1 capsule (50,000 Units total) by mouth every 7 (seven) days., Disp: 12 capsule, Rfl: 0

## 2023-05-20 NOTE — Patient Instructions (Addendum)
Continue nebulizer treatments  We will cancel your CPAP machine orders given difficulty wearing it  Keep working on the weight loss  Follow up in 6 months, call sooner if needed

## 2023-05-22 ENCOUNTER — Encounter: Payer: Self-pay | Admitting: Pulmonary Disease

## 2023-05-22 MED ORDER — ROMOSOZUMAB-AQQG 105 MG/1.17ML ~~LOC~~ SOSY
210.0000 mg | PREFILLED_SYRINGE | Freq: Once | SUBCUTANEOUS | Status: DC
Start: 2023-06-05 — End: 2023-05-29

## 2023-05-22 NOTE — Addendum Note (Signed)
Addended by: Saralyn Pilar on: 05/22/2023 12:45 PM   Modules accepted: Orders

## 2023-05-22 NOTE — Addendum Note (Signed)
Addended by: Verlan Friends on: 05/22/2023 12:21 PM   Modules accepted: Orders

## 2023-05-26 ENCOUNTER — Ambulatory Visit: Payer: Medicare Other

## 2023-05-27 ENCOUNTER — Telehealth: Payer: Self-pay

## 2023-05-27 NOTE — Telephone Encounter (Signed)
Spoke with Suzette Battiest stated PA was denied and a determination letter was faxed over with instructions on how to appeal.

## 2023-05-27 NOTE — Telephone Encounter (Signed)
[employer] called and LVM to discuss PA.   Call back number [phone]

## 2023-05-29 ENCOUNTER — Encounter: Payer: Self-pay | Admitting: Family Medicine

## 2023-05-29 ENCOUNTER — Other Ambulatory Visit: Payer: Self-pay | Admitting: Family Medicine

## 2023-05-29 DIAGNOSIS — M810 Age-related osteoporosis without current pathological fracture: Secondary | ICD-10-CM

## 2023-05-29 MED ORDER — TERIPARATIDE 620 MCG/2.48ML ~~LOC~~ SOPN
20.0000 ug | PEN_INJECTOR | Freq: Every day | SUBCUTANEOUS | 11 refills | Status: DC
Start: 2023-05-29 — End: 2023-08-26

## 2023-05-29 NOTE — Progress Notes (Signed)
Age-related osteoporosis without current pathological fracture High risk for fracture due to osteoporosis by DEXA scan DEXA scan has not previously been completed, first scan on 11/07/2022 with T-score -3.2.  T-score -3.2 meets criteria for recommendation to start anabolic therapy for T-score less than 3.0 as this indicates high risk of fracture. Initially recommended Evenity monthly injections for a year followed by Prolia injections every 6 months or a weekly bisphosphonate after that.  Patient agreeable to starting Evenity monthly injections.  Prescription ordered but denied by insurance, suggested alternatives consistent with guidelines recommending pharmacotherapy with anabolic medication include teriparatide or Tymlos daily injections (alternatives alendronate, ibandronate, risedronate are not anabolic medications. Prolia reserved for patients unable to use anabolic therapy). [place] [employer] Osteoporosis guidelines regarding Evenity: "The best candidates are women at very high risk of fracture, including those with prior and especially recent fractures, very low BMD (T-score below 3.0), and those who sustain fractures or lose BMD while taking antiremodeling therapy." Start teriparatide 20 mcg subQ injection daily for 1 year. Then switch to either Prolia injections every 6 months OR weekly oral bisphosphonate therapy.   Vitamin D low at 26.7.  Start vitamin D 50,000 units once weekly for 12 weeks, then switch to over-the-counter vitamin D3 daily supplement.  Calcium within normal limits, continue calcium supplement.  Meds ordered this encounter  Medications   Teriparatide 620 MCG/2.48ML SOPN    Sig: Inject 20 mcg into the skin daily.    Dispense:  2.48 mL    Refill:  11    Order Specific Question:   Supervising Provider    Answer:   Sandre Kitty [[id number]]

## 2023-06-03 ENCOUNTER — Ambulatory Visit: Payer: Medicare Other

## 2023-06-03 DIAGNOSIS — Z0000 Encounter for general adult medical examination without abnormal findings: Secondary | ICD-10-CM

## 2023-06-03 NOTE — Progress Notes (Signed)
Subjective:   Janice Pace is a 77 y.o. female who presents for Medicare Annual (Subsequent) preventive examination.  Visit Complete: Virtual I connected with  [name] on 06/03/23 by a audio enabled telemedicine application and verified that I am speaking with the correct person using two identifiers.  Patient Location: Home  Provider Location: Office/Clinic  I discussed the limitations of evaluation and management by telemedicine. The patient expressed understanding and agreed to proceed.  Vital Signs: Because this visit was a virtual/telehealth visit, some criteria may be missing or patient reported. Any vitals not documented were not able to be obtained and vitals that have been documented are patient reported.    Cardiac Risk Factors include: advanced age (>49men, >12 women);dyslipidemia;hypertension     Objective:    Today's Vitals   There is no height or weight on file to calculate BMI.     06/03/2023    8:34 AM 09/17/2022    7:05 AM 09/12/2022    8:18 AM 03/02/2021    8:00 AM 02/19/2021    8:35 AM 09/02/2011    9:12 AM  Advanced Directives  Does Patient Have a Medical Advance Directive? Yes Yes Yes Yes Yes Patient does not have advance directive;Patient would like information  Type of Advance Directive Healthcare Power of [place];Living will Living will Living will;Healthcare Power of [employer] Power of [place];Living will Healthcare Power of [place];Living will   Does patient want to make changes to medical advance directive?   No - Patient declined No - Patient declined    Copy of Healthcare Power of Attorney in Chart? Yes - validated most recent copy scanned in chart (See row information) No - copy requested No - copy requested Yes - validated most recent copy scanned in chart (See row information) Yes - validated most recent copy scanned in chart (See row information)   Would patient like information on creating a medical advance directive?       Advance directive packet given  Pre-existing out of facility DNR order (yellow form or pink MOST form)      No    Current Medications (verified) Outpatient Encounter Medications as of 06/03/2023  Medication Sig   albuterol (VENTOLIN HFA) 108 (90 Base) MCG/ACT inhaler Inhale 1-2 puffs into the lungs every 6 (six) hours as needed for wheezing or shortness of breath.   aspirin 81 MG EC tablet Take 1 tablet by mouth daily.   budesonide (PULMICORT) 0.5 MG/2ML nebulizer solution Generic for Pulmicort. Inhale one vial in nebulizer twice a day. Rinse mouth after use.   buPROPion ER (WELLBUTRIN SR) 100 MG 12 hr tablet Take 1 tablet (100 mg total) by mouth 2 (two) times daily. START: 1 tab daily for week 1, then 1 tab BID   docusate sodium (COLACE) 100 MG capsule Take 500 mg by mouth at bedtime.   formoterol (PERFOROMIST) 20 MCG/2ML nebulizer solution Substituted for: Perforomist Solution Inhale one vial in nebulizer twice a day.   metoprolol succinate (TOPROL-XL) 50 MG 24 hr tablet Take 1 tablet (50 mg total) by mouth daily. Take with or immediately following a meal.   naltrexone (DEPADE) 50 MG tablet Take 0.5 tablets (25 mg total) by mouth daily.   omeprazole (PRILOSEC) 20 MG capsule Take 1 capsule (20 mg total) by mouth daily as needed (acid reflux).   revefenacin (YUPELRI) 175 MCG/3ML nebulizer solution Take 3 mLs (175 mcg total) by nebulization daily.   rosuvastatin (CRESTOR) 10 MG tablet Take 1 tablet (10 mg  total) by mouth daily.   spironolactone (ALDACTONE) 25 MG tablet TAKE 1 TABLET BY MOUTH ONCE DAILY IN THE MORNING   Vitamin D, Ergocalciferol, (DRISDOL) 1.25 MG (50000 UNIT) CAPS capsule Take 1 capsule (50,000 Units total) by mouth every 7 (seven) days.   gabapentin (NEURONTIN) 300 MG capsule Take 1-2 capsules (300-600 mg total) by mouth 3 (three) times daily as needed (Nerve pain). (Patient not taking: Reported on 06/03/2023)   Teriparatide 620 MCG/2.48ML SOPN Inject 20 mcg into the skin daily.  (Patient not taking: Reported on 06/03/2023)   No facility-administered encounter medications on file as of 06/03/2023.    Allergies (verified) Sulfa antibiotics, Atorvastatin, and Metformin hcl   History: Past Medical History:  Diagnosis Date   Arthritis    Asthma    Bronchitis    Cancer (HCC)    dermatosivros arcoma and protuverans   Chronic back pain    degenerative disc disease   Constipation    stool softener daily   Constipation    COPD (chronic obstructive pulmonary disease) (HCC)    GERD (gastroesophageal reflux disease)    takes Prilosec daily   GERD (gastroesophageal reflux disease)    Heart murmur    Heart valve disorder    Hemorrhoids    History of hiatal hernia    Insomnia    d/t chantix   Joint pain    Nocturia    Personal history of colonic adenoma 03/13/2003   03/13/2003 - 5 mm adenoma   Pre-diabetes    Prediabetes    White coat syndrome without diagnosis of hypertension    Past Surgical History:  Procedure Laterality Date   ABDOMINAL HYSTERECTOMY     ANTERIOR LAT LUMBAR FUSION N/A 02/27/2021   Procedure: Anterior lateral interbody fusion - lateral two - lateral three - lateral three - lateral four with exploration fusion L4-S1;  Surgeon: Donalee Citrin, MD;  Location: [facility] OR;  Service: Neurosurgery;  Laterality: N/A;   BACK SURGERY  1988   BACK SURGERY  09/03/2011   rods,screws x 8    BACK SURGERY     09/2022   BUNIONECTOMY     bilateral   BUNIONECTOMY     bil feet   COLONOSCOPY     dermatosibrosarcoma protuberans     LAMINECTOMY WITH POSTERIOR LATERAL ARTHRODESIS LEVEL 3 N/A 02/27/2021   Procedure: Posterior augmentation with pedicle screws Latreal one - lateral four - Posterior Lateral and Interbody fusion;  Surgeon: Donalee Citrin, MD;  Location: [facility] OR;  Service: Neurosurgery;  Laterality: N/A;   PARTIAL HYSTERECTOMY     RECTOCELE REPAIR     Rectum Repair     RESECTION TUMOR WRIST RADICAL     x 2 rt   TUBAL LIGATION  1974   TUBAL LIGATION      WRIST SURGERY     left   Family History  Problem Relation Age of Onset   Leukemia Mother    Anesthesia problems Neg Hx    Hypotension Neg Hx    Malignant hyperthermia Neg Hx    Pseudochol deficiency Neg Hx    Colon cancer Neg Hx    Colon polyps Neg Hx    Esophageal cancer Neg Hx    Stomach cancer Neg Hx    Rectal cancer Neg Hx    Social History   Socioeconomic History   Marital status: Married    Spouse name: david   Number of children: 3   Years of education: Not on file   Highest education  level: GED or equivalent  Occupational History   Occupation: retired  Tobacco Use   Smoking status: Former    Current packs/day: 0.00    Average packs/day: 1 pack/day for 50.0 years (50.0 ttl pk-yrs)    Types: Cigarettes    Start date: 08/16/1961    Quit date: 08/17/2011    Years since quitting: 11.8    Passive exposure: Never   Smokeless tobacco: Never  Vaping Use   Vaping status: Never Used  Substance and Sexual Activity   Alcohol use: No    Comment: occasional wine   Drug use: No   Sexual activity: Yes    Birth control/protection: Surgical, Other-see comments  Other Topics Concern   Not on file  Social History Narrative   ** Merged History Encounter **       Social Determinants of Health   Financial Resource Strain: Low Risk  (06/03/2023)   Overall Financial Resource Strain (CARDIA)    Difficulty of Paying Living Expenses: Not hard at all  Food Insecurity: No Food Insecurity (06/03/2023)   Hunger Vital Sign    Worried About Running Out of Food in the Last Year: Never true    Ran Out of Food in the Last Year: Never true  Transportation Needs: No Transportation Needs (06/03/2023)   PRAPARE - Administrator, Civil Service (Medical): No    Lack of Transportation (Non-Medical): No  Physical Activity: Inactive (06/03/2023)   Exercise Vital Sign    Days of Exercise per Week: 0 days    Minutes of Exercise per Session: 0 min  Stress: No Stress Concern Present  (06/03/2023)   [employer] of Occupational Health - Occupational Stress Questionnaire    Feeling of Stress : Not at all  Social Connections: Socially Integrated (06/03/2023)   Social Connection and Isolation Panel [NHANES]    Frequency of Communication with Friends and Family: More than three times a week    Frequency of Social Gatherings with Friends and Family: Once a week    Attends Religious Services: More than 4 times per year    Active Member of [employer] or Organizations: Yes    Attends Engineer, structural: More than 4 times per year    Marital Status: Married    Tobacco Counseling Counseling given: Not Answered   Clinical Intake:  Pre-visit preparation completed: Yes  Pain : No/denies pain     Nutritional Risks: None Diabetes: No  How often do you need to have someone help you when you read instructions, pamphlets, or other written materials from your doctor or pharmacy?: 1 - Never  Interpreter Needed?: No  Information entered by :: NAllen LPN   Activities of Daily Living    06/03/2023    8:27 AM 09/12/2022    8:21 AM  In your present state of health, do you have any difficulty performing the following activities:  Hearing? 0   Vision? 0   Difficulty concentrating or making decisions? 0   Walking or climbing stairs? 1   Comment due to back surgery   Dressing or bathing? 0   Doing errands, shopping? 0 0  Preparing Food and eating ? N   Using the Toilet? N   In the past six months, have you accidently leaked urine? N   Do you have problems with loss of bowel control? N   Managing your Medications? N   Managing your Finances? N   Housekeeping or managing your Housekeeping? N     Patient  Care Team: Melida Quitter, PA as PCP - General (Family Medicine) Pcp, No Helane Rima, DO (Family Medicine) Pa, [facility]  Indicate any recent Medical Services you may have received from other than [facility] providers in the past year  (date may be approximate).     Assessment:   This is a routine wellness examination for [place].  Hearing/Vision screen Hearing Screening - Comments:: Denies hearing issues Vision Screening - Comments:: Regular eye exams, [facility]   Goals Addressed             This Visit's Progress    Patient Stated       06/03/2023, wants to start walking again       Depression Screen    06/03/2023    8:36 AM 03/24/2023   10:40 AM 10/15/2022   11:25 AM 05/28/2022    8:52 AM 05/20/2022   11:23 AM 11/25/2021    8:57 AM 10/01/2021    8:10 AM  PHQ 2/9 Scores  PHQ - 2 Score 0 0 0 0 0 0 0  PHQ- 9 Score 0 3 2 0 0 0 0    Fall Risk    06/03/2023    8:35 AM 10/15/2022   11:26 AM 05/27/2022    9:55 PM 05/20/2022   11:23 AM 10/01/2021    8:10 AM  Fall Risk   Falls in the past year? 0 0 0 0 0  Number falls in past yr: 0 0 0 0 0  Injury with Fall? 0 0 0 0 0  Risk for fall due to : Medication side effect No Fall Risks   No Fall Risks  Follow up Falls prevention discussed;Falls evaluation completed    Falls evaluation completed    MEDICARE RISK AT HOME: Medicare Risk at Home Any stairs in or around the home?: Yes If so, are there any without handrails?: No Home free of loose throw rugs in walkways, pet beds, electrical cords, etc?: Yes Adequate lighting in your home to reduce risk of falls?: Yes Life alert?: No Use of a cane, walker or w/c?: Yes Grab bars in the bathroom?: No Shower chair or bench in shower?: No Elevated toilet seat or a handicapped toilet?: No  TIMED UP AND GO:  Was the test performed?  No    Cognitive Function:        06/03/2023    8:36 AM 05/28/2022    8:58 AM 06/03/2021   11:14 AM  6CIT Screen  What Year? 0 points 0 points   What month? 0 points 0 points   What time? 0 points 0 points 0 points  Count back from 20 0 points 0 points 0 points  Months in reverse 0 points  0 points  Repeat phrase 0 points 2 points 2 points  Total Score 0 points       Immunizations Immunization History  Administered Date(s) Administered   Fluad Quad(high Dose 65+) 05/07/2021, 05/28/2022   Fluad Trivalent(High Dose 65+) 05/19/2023   Influenza Split 06/17/2012, 04/26/2014   Influenza, High Dose Seasonal PF 05/02/2015, 05/08/2016, 05/25/2017, 05/26/2018, 04/21/2019, 05/09/2020   PFIZER(Purple Top)SARS-COV-2 Vaccination 08/04/2019, 08/24/2019, 04/27/2020   Pneumococcal Conjugate-13 04/26/2014   Pneumococcal Polysaccharide-23 01/27/2012   Tdap 05/08/2016   Zoster Recombinant(Shingrix) 07/22/2018, 10/12/2018    TDAP status: Up to date  Flu Vaccine status: Up to date  Pneumococcal vaccine status: Up to date  Covid-19 vaccine status: Information provided on how to obtain vaccines.   Qualifies for Shingles Vaccine? Yes  Zostavax completed Yes   Shingrix Completed?: Yes  Screening Tests Health Maintenance  Topic Date Due   Hepatitis C Screening  Never done   COVID-19 Vaccine (4 - 2023-24 season) 03/15/2023   Diabetic kidney evaluation - Urine ACR  05/29/2027 (Originally 09/27/1963)   HEMOGLOBIN A1C  11/16/2023   Lung Cancer Screening  12/23/2023   Diabetic kidney evaluation - eGFR measurement  05/18/2024   Medicare Annual Wellness (AWV)  06/02/2024   Colonoscopy  11/25/2025   DTaP/Tdap/Td (2 - Td or Tdap) 05/08/2026   Pneumonia Vaccine 63+ Years old  Completed   INFLUENZA VACCINE  Completed   DEXA SCAN  Completed   Zoster Vaccines- Shingrix  Completed   HPV VACCINES  Aged Out   FOOT EXAM  Discontinued   OPHTHALMOLOGY EXAM  Discontinued    Health Maintenance  Health Maintenance Due  Topic Date Due   Hepatitis C Screening  Never done   COVID-19 Vaccine (4 - 2023-24 season) 03/15/2023    Colorectal cancer screening: Type of screening: Colonoscopy. Completed 11/26/2022. Repeat every 3 years  Mammogram status: Completed 08/07/2022. Repeat every year  Bone Density status: Completed 10/28/2022.   Lung Cancer Screening: (Low Dose CT  Chest recommended if Age 59-80 years, 20 pack-year currently smoking OR have quit w/in 15years.) does qualify.   Lung Cancer Screening Referral: CT scan 12/23/2022  Additional Screening:  Hepatitis C Screening: does qualify;   Vision Screening: Recommended annual ophthalmology exams for early detection of glaucoma and other disorders of the eye. Is the patient up to date with their annual eye exam?  Yes  Who is the provider or what is the name of the office in which the patient attends annual eye exams? [facility] If pt is not established with a provider, would they like to be referred to a provider to establish care? No .   Dental Screening: Recommended annual dental exams for proper oral hygiene  Diabetic Foot Exam: n/a  Community Resource Referral / Chronic Care Management: CRR required this visit?  No   CCM required this visit?  No     Plan:     I have personally reviewed and noted the following in the patient's chart:   Medical and social history Use of alcohol, tobacco or illicit drugs  Current medications and supplements including opioid prescriptions. Patient is not currently taking opioid prescriptions. Functional ability and status Nutritional status Physical activity Advanced directives List of other physicians Hospitalizations, surgeries, and ER visits in previous 12 months Vitals Screenings to include cognitive, depression, and falls Referrals and appointments  In addition, I have reviewed and discussed with patient certain preventive protocols, quality metrics, and best practice recommendations. A written personalized care plan for preventive services as well as general preventive health recommendations were provided to patient.     Barb Merino, LPN   [id number]   After Visit Summary: (MyChart) Due to this being a telephonic visit, the after visit summary with patients personalized plan was offered to patient via MyChart   Nurse Notes:  none

## 2023-06-03 NOTE — Patient Instructions (Signed)
Janice Pace , Thank you for taking time to come for your Medicare Wellness Visit. I appreciate your ongoing commitment to your health goals. Please review the following plan we discussed and let me know if I can assist you in the future.   Referrals/Orders/Follow-Ups/Clinician Recommendations: none  This is a list of the screening recommended for you and due dates:  Health Maintenance  Topic Date Due   Hepatitis C Screening  Never done   COVID-19 Vaccine (4 - 2023-24 season) 03/15/2023   Yearly kidney health urinalysis for diabetes  05/29/2027*   Hemoglobin A1C  11/16/2023   Screening for Lung Cancer  12/23/2023   Yearly kidney function blood test for diabetes  05/18/2024   Medicare Annual Wellness Visit  06/02/2024   Colon Cancer Screening  11/25/2025   DTaP/Tdap/Td vaccine (2 - Td or Tdap) 05/08/2026   Pneumonia Vaccine  Completed   Flu Shot  Completed   DEXA scan (bone density measurement)  Completed   Zoster (Shingles) Vaccine  Completed   HPV Vaccine  Aged Out   Complete foot exam   Discontinued   Eye exam for diabetics  Discontinued  *Topic was postponed. The date shown is not the original due date.    Advanced directives: (In Chart) A copy of your advanced directives are scanned into your chart should your provider ever need it.  Next Medicare Annual Wellness Visit scheduled for next year: No, schedule is not open for next year  Insert Preventive Care attachment Insert FALL PREVENTION attachment if needed

## 2023-06-19 ENCOUNTER — Ambulatory Visit: Payer: Medicare Other | Admitting: Internal Medicine

## 2023-06-22 ENCOUNTER — Ambulatory Visit: Payer: Medicare Other | Admitting: Cardiology

## 2023-06-25 ENCOUNTER — Encounter: Payer: Self-pay | Admitting: Family Medicine

## 2023-06-25 ENCOUNTER — Ambulatory Visit (INDEPENDENT_AMBULATORY_CARE_PROVIDER_SITE_OTHER): Payer: Medicare Other | Admitting: Family Medicine

## 2023-06-25 VITALS — BP 136/87 | HR 71 | Ht 62.0 in | Wt 182.4 lb

## 2023-06-25 DIAGNOSIS — K219 Gastro-esophageal reflux disease without esophagitis: Secondary | ICD-10-CM | POA: Diagnosis not present

## 2023-06-25 DIAGNOSIS — Z6835 Body mass index (BMI) 35.0-35.9, adult: Secondary | ICD-10-CM

## 2023-06-25 DIAGNOSIS — M810 Age-related osteoporosis without current pathological fracture: Secondary | ICD-10-CM | POA: Diagnosis not present

## 2023-06-25 MED ORDER — OMEPRAZOLE 20 MG PO CPDR
20.0000 mg | DELAYED_RELEASE_CAPSULE | Freq: Every day | ORAL | 0 refills | Status: DC | PRN
Start: 2023-06-25 — End: 2023-12-22

## 2023-06-25 MED ORDER — BUPROPION HCL ER (SR) 150 MG PO TB12
150.0000 mg | ORAL_TABLET | Freq: Two times a day (BID) | ORAL | 1 refills | Status: DC
Start: 2023-06-25 — End: 2023-08-26

## 2023-06-25 MED ORDER — NALTREXONE HCL 50 MG PO TABS: 25.0000 mg | ORAL_TABLET | Freq: Every day | ORAL | 1 refills | Status: DC

## 2023-06-25 NOTE — Progress Notes (Signed)
Established Patient Office Visit  Subjective   Patient ID: Janice Pace, female    DOB: Oct 06, 1945  Age: 77 y.o. MRN: [id number]  Chief Complaint  Patient presents with   Medical Management of Chronic Issues    HPI Janice Pace is a 77 y.o. female presenting today for follow up of weight management. Weight has decreased 7 lbs since last visit. They have been following a low-carb diet prioritizing protein.  She is currently taking low-dose naltrexone and bupropion 100 mg twice daily.  She has noticed that as time has gone on, the medication has been less effective at controlling appetite.  She would like to increase dose if possible.  Outpatient Medications Prior to Visit  Medication Sig   albuterol (VENTOLIN HFA) 108 (90 Base) MCG/ACT inhaler Inhale 1-2 puffs into the lungs every 6 (six) hours as needed for wheezing or shortness of breath.   aspirin 81 MG EC tablet Take 1 tablet by mouth daily.   budesonide (PULMICORT) 0.5 MG/2ML nebulizer solution Generic for Pulmicort. Inhale one vial in nebulizer twice a day. Rinse mouth after use.   docusate sodium (COLACE) 100 MG capsule Take 500 mg by mouth at bedtime.   formoterol (PERFOROMIST) 20 MCG/2ML nebulizer solution Substituted for: Perforomist Solution Inhale one vial in nebulizer twice a day.   metoprolol succinate (TOPROL-XL) 50 MG 24 hr tablet Take 1 tablet (50 mg total) by mouth daily. Take with or immediately following a meal.   revefenacin (YUPELRI) 175 MCG/3ML nebulizer solution Take 3 mLs (175 mcg total) by nebulization daily.   rosuvastatin (CRESTOR) 10 MG tablet Take 1 tablet (10 mg total) by mouth daily.   spironolactone (ALDACTONE) 25 MG tablet TAKE 1 TABLET BY MOUTH ONCE DAILY IN THE MORNING   Teriparatide 620 MCG/2.48ML SOPN Inject 20 mcg into the skin daily. (Patient not taking: Reported on 06/03/2023)   Vitamin D, Ergocalciferol, (DRISDOL) 1.25 MG (50000 UNIT) CAPS capsule Take 1 capsule (50,000 Units total) by mouth  every 7 (seven) days.   [DISCONTINUED] buPROPion ER (WELLBUTRIN SR) 100 MG 12 hr tablet Take 1 tablet (100 mg total) by mouth 2 (two) times daily. START: 1 tab daily for week 1, then 1 tab BID   [DISCONTINUED] gabapentin (NEURONTIN) 300 MG capsule Take 1-2 capsules (300-600 mg total) by mouth 3 (three) times daily as needed (Nerve pain). (Patient not taking: Reported on 06/03/2023)   [DISCONTINUED] naltrexone (DEPADE) 50 MG tablet Take 0.5 tablets (25 mg total) by mouth daily.   [DISCONTINUED] omeprazole (PRILOSEC) 20 MG capsule Take 1 capsule (20 mg total) by mouth daily as needed (acid reflux).   No facility-administered medications prior to visit.    ROS Negative unless otherwise noted in HPI   Objective:     BP 136/87   Pulse 71   Ht 5\' 2"  (1.575 m)   Wt 182 lb 6.4 oz (82.7 kg)   SpO2 98%   BMI 33.36 kg/m   Physical Exam Constitutional:      General: She is not in acute distress.    Appearance: Normal appearance.  HENT:     Head: Normocephalic and atraumatic.  Cardiovascular:     Rate and Rhythm: Normal rate and regular rhythm.     Heart sounds: No murmur heard.    No friction rub. No gallop.  Pulmonary:     Effort: Pulmonary effort is normal. No respiratory distress.     Breath sounds: No wheezing, rhonchi or rales.  Skin:    General:  Skin is warm and dry.  Neurological:     Mental Status: She is alert and oriented to person, place, and time.     Assessment & Plan:  Severe obesity (BMI 35.0-35.9 with comorbidity) (HCC) Assessment & Plan: Increase bupropion to 150 mg twice daily and continue lowest dose of naltrexone 25 mg daily.  Visit #3: Starting Weight: 195 lbs  Current weight: 182 lbs Change in weight: -13 lbs total (6.7% starting weight) Goals: Control hypertension and, hyperlipidemia, prediabetes.  Improve OSA, alleviate joint pain to improve mobility. Medication: Continue naltrexone 25 mg daily, Wellbutrin 100 mg twice daily Follow-up and referrals:  follow up in 2 months  Orders: -     buPROPion HCl ER (SR); Take 1 tablet (150 mg total) by mouth 2 (two) times daily.  Dispense: 180 tablet; Refill: 1 -     Naltrexone HCl; Take 0.5 tablets (25 mg total) by mouth daily.  Dispense: 45 tablet; Refill: 1  Gastroesophageal reflux disease without esophagitis -     Omeprazole; Take 1 capsule (20 mg total) by mouth daily as needed (acid reflux).  Dispense: 90 capsule; Refill: 0  Age-related osteoporosis without current pathological fracture Assessment & Plan: She was finally able to start on medication.  Continue teriparatide 20 mcg daily injection.  Continue vitamin D supplement.  Will continue to monitor.     Return in about 2 months (around 08/26/2023) for follow-up for weight management.    Melida Quitter, PA

## 2023-06-25 NOTE — Patient Instructions (Signed)
GOALS: -64 oz fluid intake daily -80-90 g protein daily -20-25 g fiber daily

## 2023-06-25 NOTE — Assessment & Plan Note (Signed)
She was finally able to start on medication.  Continue teriparatide 20 mcg daily injection.  Continue vitamin D supplement.  Will continue to monitor.

## 2023-06-25 NOTE — Assessment & Plan Note (Addendum)
Increase bupropion to 150 mg twice daily and continue lowest dose of naltrexone 25 mg daily.  Visit #3: Starting Weight: 195 lbs  Current weight: 182 lbs Change in weight: -13 lbs total (6.7% starting weight) Goals: Control hypertension and, hyperlipidemia, prediabetes.  Improve OSA, alleviate joint pain to improve mobility. Medication: Continue naltrexone 25 mg daily, Wellbutrin 100 mg twice daily Follow-up and referrals: follow up in 2 months

## 2023-06-30 ENCOUNTER — Telehealth: Payer: Self-pay | Admitting: Pulmonary Disease

## 2023-06-30 DIAGNOSIS — R0683 Snoring: Secondary | ICD-10-CM

## 2023-06-30 DIAGNOSIS — G4733 Obstructive sleep apnea (adult) (pediatric): Secondary | ICD-10-CM

## 2023-06-30 NOTE — Telephone Encounter (Signed)
Atc pt no answer, lvmm

## 2023-06-30 NOTE — Telephone Encounter (Signed)
Patient needs a new prescription to be sent to [facility] fax [phone] Attention [place].

## 2023-07-09 NOTE — Telephone Encounter (Signed)
Called and spoke with pt who needed a order to d/c cpap which her and Francine Graven spoke about. Nfn

## 2023-07-23 DIAGNOSIS — M533 Sacrococcygeal disorders, not elsewhere classified: Secondary | ICD-10-CM | POA: Diagnosis not present

## 2023-08-10 ENCOUNTER — Other Ambulatory Visit: Payer: Self-pay | Admitting: Family Medicine

## 2023-08-10 DIAGNOSIS — R002 Palpitations: Secondary | ICD-10-CM

## 2023-08-10 DIAGNOSIS — R000 Tachycardia, unspecified: Secondary | ICD-10-CM

## 2023-08-13 ENCOUNTER — Encounter: Payer: Self-pay | Admitting: Family Medicine

## 2023-08-13 DIAGNOSIS — Z1231 Encounter for screening mammogram for malignant neoplasm of breast: Secondary | ICD-10-CM | POA: Diagnosis not present

## 2023-08-13 LAB — HM MAMMOGRAPHY

## 2023-08-20 ENCOUNTER — Encounter: Payer: Self-pay | Admitting: Family Medicine

## 2023-08-26 ENCOUNTER — Encounter: Payer: Self-pay | Admitting: Family Medicine

## 2023-08-26 ENCOUNTER — Ambulatory Visit (INDEPENDENT_AMBULATORY_CARE_PROVIDER_SITE_OTHER): Payer: Medicare Other | Admitting: Family Medicine

## 2023-08-26 VITALS — BP 138/72 | HR 72 | Ht 62.0 in | Wt 176.2 lb

## 2023-08-26 DIAGNOSIS — M810 Age-related osteoporosis without current pathological fracture: Secondary | ICD-10-CM

## 2023-08-26 DIAGNOSIS — R8290 Unspecified abnormal findings in urine: Secondary | ICD-10-CM

## 2023-08-26 DIAGNOSIS — I10 Essential (primary) hypertension: Secondary | ICD-10-CM

## 2023-08-26 DIAGNOSIS — Z1159 Encounter for screening for other viral diseases: Secondary | ICD-10-CM

## 2023-08-26 DIAGNOSIS — Z6835 Body mass index (BMI) 35.0-35.9, adult: Secondary | ICD-10-CM

## 2023-08-26 DIAGNOSIS — F411 Generalized anxiety disorder: Secondary | ICD-10-CM | POA: Diagnosis not present

## 2023-08-26 DIAGNOSIS — E782 Mixed hyperlipidemia: Secondary | ICD-10-CM

## 2023-08-26 DIAGNOSIS — R7303 Prediabetes: Secondary | ICD-10-CM

## 2023-08-26 DIAGNOSIS — E559 Vitamin D deficiency, unspecified: Secondary | ICD-10-CM | POA: Diagnosis not present

## 2023-08-26 LAB — POCT URINALYSIS DIPSTICK
Bilirubin, UA: NEGATIVE
Blood, UA: NEGATIVE
Glucose, UA: NEGATIVE
Ketones, UA: NEGATIVE
Nitrite, UA: NEGATIVE
Protein, UA: POSITIVE — AB
Spec Grav, UA: 1.025 (ref 1.010–1.025)
Urobilinogen, UA: 1 U/dL
pH, UA: 7 (ref 5.0–8.0)

## 2023-08-26 MED ORDER — BUPROPION HCL ER (SR) 100 MG PO TB12
200.0000 mg | ORAL_TABLET | Freq: Two times a day (BID) | ORAL | 1 refills | Status: DC
Start: 2023-09-25 — End: 2023-12-22

## 2023-08-26 MED ORDER — ALENDRONATE SODIUM 70 MG PO TABS
70.0000 mg | ORAL_TABLET | ORAL | 11 refills | Status: DC
Start: 2023-08-26 — End: 2023-12-16

## 2023-08-26 MED ORDER — ROSUVASTATIN CALCIUM 10 MG PO TABS
10.0000 mg | ORAL_TABLET | Freq: Every day | ORAL | 1 refills | Status: DC
Start: 2023-08-26 — End: 2024-02-18

## 2023-08-26 MED ORDER — CHLORDIAZEPOXIDE HCL 10 MG PO CAPS: 10.0000 mg | ORAL_CAPSULE | ORAL | 1 refills | Status: DC | PRN

## 2023-08-26 NOTE — Progress Notes (Signed)
Established Patient Office Visit  Subjective   Patient ID: Janice Pace, female    DOB: August 30, 1945  Age: 78 y.o. MRN: [id number]  Chief Complaint  Patient presents with   Weight Management Screening   UTI Symptoms    HPI Janice Pace is a 78 y.o. female presenting today for follow up of weight management. Weight has decreased 6 lbs since last visit. They have been following a low-carb diet prioritizing protein. She is currently taking low-dose naltrexone and bupropion 150 mg twice daily.  Her bupropion dose was increased at her last appointment after noticing that it has been less effective in controlling appetite over time.  She also would like her urine checked for a UTI, she notes that her urine developed an odor over the past week.  In addition to odor, she has experienced discomfort with urination, urinary urgency, and lower abdominal pain.  She would like to discuss alternatives to the teriparatide injections for osteoporosis.  Since starting the injections, she endorses fatigue, flatulence, dry mouth, constipation, lightheadedness, and increased anxiety.  Outpatient Medications Prior to Visit  Medication Sig   albuterol (VENTOLIN HFA) 108 (90 Base) MCG/ACT inhaler Inhale 1-2 puffs into the lungs every 6 (six) hours as needed for wheezing or shortness of breath.   aspirin 81 MG EC tablet Take 1 tablet by mouth daily.   budesonide (PULMICORT) 0.5 MG/2ML nebulizer solution Generic for Pulmicort. Inhale one vial in nebulizer twice a day. Rinse mouth after use.   docusate sodium (COLACE) 100 MG capsule Take 500 mg by mouth at bedtime.   formoterol (PERFOROMIST) 20 MCG/2ML nebulizer solution Substituted for: Perforomist Solution Inhale one vial in nebulizer twice a day.   metoprolol succinate (TOPROL-XL) 50 MG 24 hr tablet TAKE 1 TABLET BY MOUTH ONCE DAILY. TAKE WITH OR IMMEDIATELY FOLLOWING A MEAL   naltrexone (DEPADE) 50 MG tablet Take 0.5 tablets (25 mg total) by mouth daily.    omeprazole (PRILOSEC) 20 MG capsule Take 1 capsule (20 mg total) by mouth daily as needed (acid reflux).   revefenacin (YUPELRI) 175 MCG/3ML nebulizer solution Take 3 mLs (175 mcg total) by nebulization daily.   spironolactone (ALDACTONE) 25 MG tablet TAKE 1 TABLET BY MOUTH ONCE DAILY IN THE MORNING   [DISCONTINUED] buPROPion (WELLBUTRIN SR) 150 MG 12 hr tablet Take 1 tablet (150 mg total) by mouth 2 (two) times daily.   [DISCONTINUED] rosuvastatin (CRESTOR) 10 MG tablet Take 1 tablet (10 mg total) by mouth daily.   [DISCONTINUED] Teriparatide 620 MCG/2.48ML SOPN Inject 20 mcg into the skin daily.   [DISCONTINUED] Vitamin D, Ergocalciferol, (DRISDOL) 1.25 MG (50000 UNIT) CAPS capsule Take 1 capsule (50,000 Units total) by mouth every 7 (seven) days.   No facility-administered medications prior to visit.    ROS Negative unless otherwise noted in HPI   Objective:     BP 138/72   Pulse 72   Ht 5\' 2"  (1.575 m)   Wt 176 lb 4 oz (79.9 kg)   SpO2 98%   BMI 32.24 kg/m   Physical Exam Constitutional:      General: She is not in acute distress.    Appearance: Normal appearance.  HENT:     Head: Normocephalic and atraumatic.  Cardiovascular:     Rate and Rhythm: Normal rate and regular rhythm.     Heart sounds: No murmur heard.    No friction rub. No gallop.  Pulmonary:     Effort: Pulmonary effort is normal. No respiratory distress.  Breath sounds: No wheezing, rhonchi or rales.  Skin:    General: Skin is warm and dry.  Neurological:     Mental Status: She is alert and oriented to person, place, and time.    Results for orders placed or performed in visit on 08/26/23  POCT urinalysis dipstick  Result Value Ref Range   Color, UA yellow    Clarity, UA cloudy    Glucose, UA Negative Negative   Bilirubin, UA negative    Ketones, UA negative    Spec Grav, UA 1.025 1.010 - 1.025   Blood, UA negative    pH, UA 7.0 5.0 - 8.0   Protein, UA Positive (A) Negative   Urobilinogen,  UA 1.0 0.2 or 1.0 E.U./dL   Nitrite, UA negative    Leukocytes, UA Small (1+) (A) Negative   Appearance     Odor      Assessment & Plan:  Severe obesity (BMI 35.0-35.9 with comorbidity) (HCC) Assessment & Plan: Increase bupropion to 200 mg twice daily and continue lowest dose of naltrexone 25 mg daily.  Visit #4: Starting Weight: 195 lbs  Current weight: 176 lbs Change in weight: -19 lbs total (9.7% starting weight) Goals: Control hypertension and, hyperlipidemia, prediabetes.  Improve OSA, alleviate joint pain to improve mobility. Medication: Continue naltrexone 25 mg daily, Wellbutrin 200 mg twice daily Follow-up and referrals: follow up in 2 months  Orders: -     buPROPion HCl ER (SR); Take 2 tablets (200 mg total) by mouth 2 (two) times daily.  Dispense: 360 tablet; Refill: 1  Age-related osteoporosis without current pathological fracture Assessment & Plan: Discontinue teriparatide 20 mcg daily injection due to side effects.  Start alendronate 70 mg weekly, continue calcium and vitamin D over-the-counter supplements.  Will continue to monitor.  Orders: -     Alendronate Sodium; Take 1 tablet (70 mg total) by mouth once a week. Take with a full glass of water on an empty stomach.  Dispense: 4 tablet; Refill: 11  Mixed hyperlipidemia -     Rosuvastatin Calcium; Take 1 tablet (10 mg total) by mouth daily.  Dispense: 90 tablet; Refill: 1 -     CBC with Differential/Platelet; Future -     Comprehensive metabolic panel; Future -     Lipid panel; Future  Generalized anxiety disorder -     chlordiazePOXIDE HCl; Take 1 capsule (10 mg total) by mouth as needed for anxiety. Maximum twice daily.  Dispense: 20 capsule; Refill: 1  Essential hypertension Assessment & Plan: BP goal <130/80.  Stable, close to goal.  Continue metoprolol 50 mg daily, spironolactone 25 mg daily.  Continue ambulatory blood pressure monitoring.  Blood pressure should continue to improve with weight loss as  well.  Orders: -     CBC with Differential/Platelet; Future -     Comprehensive metabolic panel; Future  Vitamin D deficiency -     VITAMIN D 25 Hydroxy (Vit-D Deficiency, Fractures); Future  Prediabetes -     Hemoglobin A1c; Future  Screening for viral disease -     Hepatitis C antibody; Future  Abnormal urine odor -     POCT urinalysis dipstick  Urinalysis positive for leukocytes but not nitrites, inconclusive for UTI.  Sending for urine culture prior to initiating antibiotic therapy.  Return in about 2 months (around 10/24/2023) for follow-up for HTN, HLD, preDM, weight management, fasting labs 1 week before.    Melida Quitter, PA

## 2023-08-26 NOTE — Assessment & Plan Note (Signed)
BP goal <130/80.  Stable, close to goal.  Continue metoprolol 50 mg daily, spironolactone 25 mg daily.  Continue ambulatory blood pressure monitoring.  Blood pressure should continue to improve with weight loss as well.

## 2023-08-26 NOTE — Assessment & Plan Note (Signed)
Discontinue teriparatide 20 mcg daily injection due to side effects.  Start alendronate 70 mg weekly, continue calcium and vitamin D over-the-counter supplements.  Will continue to monitor.

## 2023-08-26 NOTE — Assessment & Plan Note (Addendum)
Increase bupropion to 200 mg twice daily and continue lowest dose of naltrexone 25 mg daily.  Visit #4: Starting Weight: 195 lbs  Current weight: 176 lbs Change in weight: -19 lbs total (9.7% starting weight) Goals: Control hypertension and, hyperlipidemia, prediabetes.  Improve OSA, alleviate joint pain to improve mobility. Medication: Continue naltrexone 25 mg daily, Wellbutrin 200 mg twice daily Follow-up and referrals: follow up in 2 months

## 2023-08-26 NOTE — Patient Instructions (Addendum)
It has been so lovely working with you, thank you for trusting me to be part of your healthcare team!  I am so excited for you and proud of your progress.    CALCIUM & VITAMIN D: Calcium: total of 1200 mg of calcium daily.  If you eat a very calcium rich diet you may be able to obtain that without a supplement.  If not, then I recommend calcium 500 mg twice a day.  There are several products over-the-counter such as Caltrate D and Viactiv chews which are great options that contain calcium and vitamin D. Vitamin D: recommend 2000 international units daily.

## 2023-09-02 LAB — URINE CULTURE

## 2023-09-02 NOTE — Telephone Encounter (Signed)
Copied from CRM [id number]. Topic: General - Other >> Sep 02, 2023 12:22 PM Antony Haste wrote: Reason for CRM: Mickie from [facility] wanted to let this patient's PCP know that the "last sample hasn't been received for testing and it's now past stability." Mickie's Callback #: [phone]

## 2023-09-02 NOTE — Telephone Encounter (Signed)
Tried to call back [facility], no answer. We think there was not enough sample given to perform a culture.

## 2023-09-03 ENCOUNTER — Encounter: Payer: Self-pay | Admitting: Family Medicine

## 2023-09-23 ENCOUNTER — Other Ambulatory Visit: Payer: Self-pay

## 2023-09-23 ENCOUNTER — Ambulatory Visit
Admission: EM | Admit: 2023-09-23 | Discharge: 2023-09-23 | Disposition: A | Discharge status: Home / Self Care | Attending: Physician Assistant | Admitting: Physician Assistant

## 2023-09-23 DIAGNOSIS — R300 Dysuria: Secondary | ICD-10-CM | POA: Diagnosis not present

## 2023-09-23 DIAGNOSIS — N390 Urinary tract infection, site not specified: Secondary | ICD-10-CM

## 2023-09-23 LAB — POCT URINALYSIS DIP (MANUAL ENTRY)
Blood, UA: NEGATIVE
Glucose, UA: 100 mg/dL — AB
Nitrite, UA: POSITIVE — AB
Protein Ur, POC: 100 mg/dL — AB
Spec Grav, UA: >=1.03 — AB (ref 1.010–1.025)
Urobilinogen, UA: 2 U/dL — AB
pH, UA: 5 (ref 5.0–8.0)

## 2023-09-23 MED ORDER — CEFTRIAXONE SODIUM 1 G IJ SOLR
1.0000 g | Freq: Once | INTRAMUSCULAR | Status: AC
  Administered 2023-09-23: 1 g via INTRAMUSCULAR

## 2023-09-23 MED ORDER — CIPROFLOXACIN HCL 500 MG PO TABS: 500.0000 mg | ORAL_TABLET | Freq: Two times a day (BID) | ORAL | 0 refills | Status: DC

## 2023-09-23 NOTE — Discharge Instructions (Addendum)
 We are treating you for an urinary tract infection.  We gave you an injection of antibiotics today and I would like you to start ciprofloxacin twice daily for 5 days.  If you develop any joint pain, nausea, vomiting, elevated blood sugar stop the medication to be seen immediately.  Make sure you are drinking plenty of fluid.  Follow-up with your primary care in a few weeks to ensure that your urine normalizes after we treat the infection.  If your symptoms are not improving within a few days or if anything worsens and you have fever, abdominal pain, blood in your urine, nausea/vomiting, weakness you need to go to the ER.  We will contact you if we need to stop or change your antibiotics based on culture results.

## 2023-09-23 NOTE — ED Triage Notes (Signed)
 Patient here today with c/o burning in urination and lower abd pressure X 2 days. She tried taking AZO last night and cranberry pills this morning with little relief.

## 2023-09-23 NOTE — ED Provider Notes (Signed)
 [facility]    CSN: [id number] Arrival date & time: 09/23/23  1008      History   Chief Complaint Chief Complaint  Patient presents with   Dysuria    HPI Janice Pace is a 78 y.o. female.   Patient presents today with a 2-day history of UTI symptoms.  She reports dysuria, frequency, urgency, lower abdominal pain, lower back pain.  Denies any fever, vaginal symptoms, nausea, vomiting, diarrhea.  She does have a history of UTI with similar presentation.  She reports that this is infrequent and she does not see urology.  Denies history of nephrolithiasis.  Denies any recent abdominal procedure or catheterization.  She does have a history of prediabetes but does not take any aspirin due to inhibitor.  Denies any recent antibiotics in the past 90 days.    Past Medical History:  Diagnosis Date   Arthritis    Asthma    Bronchitis    Cancer (HCC)    dermatosivros arcoma and protuverans   Chronic back pain    degenerative disc disease   Constipation    stool softener daily   Constipation    COPD (chronic obstructive pulmonary disease) (HCC)    GERD (gastroesophageal reflux disease)    takes Prilosec daily   GERD (gastroesophageal reflux disease)    Heart murmur    Heart valve disorder    Hemorrhoids    History of hiatal hernia    Insomnia    d/t chantix   Joint pain    Nocturia    Personal history of colonic adenoma 03/13/2003   03/13/2003 - 5 mm adenoma   Pre-diabetes    Prediabetes    White coat syndrome without diagnosis of hypertension     Patient Active Problem List   Diagnosis Date Noted   DDD (degenerative disc disease), lumbar 09/17/2022   Essential hypertension 06/21/2022   Ovarian failure 05/28/2022   COPD with asthma (HCC) 05/06/2022   Leg cramps 11/25/2021   Menopausal vasomotor syndrome 11/25/2021   Bilateral adrenal adenomas 08/13/2021   Allergic rhinitis due to pollen 08/13/2021   Asthma without status asthmaticus 08/13/2021   Fatty  liver 08/13/2021   Lumbosacral spondylosis without myelopathy 08/13/2021   Generalized anxiety disorder 08/13/2021   Colitis 03/02/2021   Ileus (HCC) 03/02/2021   GERD (gastroesophageal reflux disease) 03/02/2021   Spinal stenosis of lumbar region 02/27/2021   Osteoporosis 12/11/2020   Lumbago with sciatica, unspecified side 10/16/2020   Hardening of the aorta (main artery of the heart) (HCC) 07/23/2020   Prediabetes 10/18/2019   Mixed hyperlipidemia 10/03/2019   Vitamin D deficiency 10/03/2019   Severe obesity (BMI 35.0-35.9 with comorbidity) (HCC) 10/03/2019   Unilateral primary osteoarthritis, left knee 08/18/2019   Carpal tunnel syndrome, left upper limb 11/06/2017   Hx of adenomatous colonic polyps 03/13/2003    Past Surgical History:  Procedure Laterality Date   ABDOMINAL HYSTERECTOMY     ANTERIOR LAT LUMBAR FUSION N/A 02/27/2021   Procedure: Anterior lateral interbody fusion - lateral two - lateral three - lateral three - lateral four with exploration fusion L4-S1;  Surgeon: Donalee Citrin, MD;  Location: [facility] OR;  Service: Neurosurgery;  Laterality: N/A;   BACK SURGERY  1988   BACK SURGERY  09/03/2011   rods,screws x 8    BACK SURGERY     09/2022   BUNIONECTOMY     bilateral   BUNIONECTOMY     bil feet   COLONOSCOPY     dermatosibrosarcoma  protuberans     LAMINECTOMY WITH POSTERIOR LATERAL ARTHRODESIS LEVEL 3 N/A 02/27/2021   Procedure: Posterior augmentation with pedicle screws Latreal one - lateral four - Posterior Lateral and Interbody fusion;  Surgeon: Donalee Citrin, MD;  Location: [facility] OR;  Service: Neurosurgery;  Laterality: N/A;   PARTIAL HYSTERECTOMY     RECTOCELE REPAIR     Rectum Repair     RESECTION TUMOR WRIST RADICAL     x 2 rt   TUBAL LIGATION  1974   TUBAL LIGATION     WRIST SURGERY     left    OB History     Gravida  3   Para  0   Term  0   Preterm  0   AB  0   Living         SAB  0   IAB  0   Ectopic  0   Multiple      Live Births                Home Medications    Prior to Admission medications   Medication Sig Start Date End Date Taking? Authorizing Provider  ciprofloxacin (CIPRO) 500 MG tablet Take 1 tablet (500 mg total) by mouth every 12 (twelve) hours. 09/23/23  Yes Arden Axon K, PA-C  albuterol (VENTOLIN HFA) 108 (90 Base) MCG/ACT inhaler Inhale 1-2 puffs into the lungs every 6 (six) hours as needed for wheezing or shortness of breath. 05/06/22   Cobb, Ruby Cola, NP  alendronate (FOSAMAX) 70 MG tablet Take 1 tablet (70 mg total) by mouth once a week. Take with a full glass of water on an empty stomach. 08/26/23   Melida Quitter, PA  aspirin 81 MG EC tablet Take 1 tablet by mouth daily.    [provider]  budesonide (PULMICORT) 0.5 MG/2ML nebulizer solution Generic for Pulmicort. Inhale one vial in nebulizer twice a day. Rinse mouth after use. 01/19/23   Martina Sinner, MD  buPROPion ER (WELLBUTRIN SR) 100 MG 12 hr tablet Take 2 tablets (200 mg total) by mouth 2 (two) times daily. 09/25/23   Melida Quitter, PA  chlordiazePOXIDE (LIBRIUM) 10 MG capsule Take 1 capsule (10 mg total) by mouth as needed for anxiety. Maximum twice daily. 09/25/23   Melida Quitter, PA  docusate sodium (COLACE) 100 MG capsule Take 500 mg by mouth at bedtime.    [provider]  formoterol (PERFOROMIST) 20 MCG/2ML nebulizer solution Substituted for: Perforomist Solution Inhale one vial in nebulizer twice a day. 01/19/23   Martina Sinner, MD  metoprolol succinate (TOPROL-XL) 50 MG 24 hr tablet TAKE 1 TABLET BY MOUTH ONCE DAILY. TAKE WITH OR IMMEDIATELY FOLLOWING A MEAL 08/10/23   Saralyn Pilar A, PA  naltrexone (DEPADE) 50 MG tablet Take 0.5 tablets (25 mg total) by mouth daily. 06/25/23   Melida Quitter, PA  omeprazole (PRILOSEC) 20 MG capsule Take 1 capsule (20 mg total) by mouth daily as needed (acid reflux). 06/25/23   Melida Quitter, PA  revefenacin (YUPELRI) 175 MCG/3ML nebulizer solution Take 3  mLs (175 mcg total) by nebulization daily. 02/04/23   Martina Sinner, MD  rosuvastatin (CRESTOR) 10 MG tablet Take 1 tablet (10 mg total) by mouth daily. 08/26/23   Melida Quitter, PA  spironolactone (ALDACTONE) 25 MG tablet TAKE 1 TABLET BY MOUTH ONCE DAILY IN THE MORNING 05/19/23   Yates Decamp, MD    Family History Family History  Problem  Relation Age of Onset   Leukemia Mother    Anesthesia problems Neg Hx    Hypotension Neg Hx    Malignant hyperthermia Neg Hx    Pseudochol deficiency Neg Hx    Colon cancer Neg Hx    Colon polyps Neg Hx    Esophageal cancer Neg Hx    Stomach cancer Neg Hx    Rectal cancer Neg Hx     Social History Social History   Tobacco Use   Smoking status: Former    Current packs/day: 0.00    Average packs/day: 1 pack/day for 50.0 years (50.0 ttl pk-yrs)    Types: Cigarettes    Start date: 08/16/1961    Quit date: 08/17/2011    Years since quitting: 12.1    Passive exposure: Never   Smokeless tobacco: Never  Vaping Use   Vaping status: Never Used  Substance Use Topics   Alcohol use: No    Comment: occasional wine   Drug use: No     Allergies   Sulfa antibiotics, Atorvastatin, and Metformin hcl   Review of Systems Review of Systems  Constitutional:  Positive for activity change. Negative for appetite change, fatigue and fever.  Gastrointestinal:  Positive for abdominal pain (Lower abdomen). Negative for diarrhea, nausea and vomiting.  Genitourinary:  Positive for dysuria, frequency and urgency. Negative for flank pain, pelvic pain, vaginal bleeding, vaginal discharge and vaginal pain.  Musculoskeletal:  Positive for back pain. Negative for arthralgias and myalgias.     Physical Exam Triage Vital Signs ED Triage Vitals  Encounter Vitals Group     BP 09/23/23 1056 (!) 142/82     Systolic BP Percentile --      Diastolic BP Percentile --      Pulse Rate 09/23/23 1056 72     Resp 09/23/23 1056 16     Temp 09/23/23 1056 99 F (37.2 C)      Temp Source 09/23/23 1056 Oral     SpO2 09/23/23 1056 94 %     Weight 09/23/23 1057 176 lb (79.8 kg)     Height 09/23/23 1057 5\' 2"  (1.575 m)     Head Circumference --      Peak Flow --      Pain Score 09/23/23 1057 9     Pain Loc --      Pain Education --      Exclude from Growth Chart --    No data found.  Updated Vital Signs BP (!) 142/82 (BP Location: Right Arm)   Pulse 72   Temp 99 F (37.2 C) (Oral)   Resp 16   Ht 5\' 2"  (1.575 m)   Wt 176 lb (79.8 kg)   SpO2 94%   BMI 32.19 kg/m   Visual Acuity Right Eye Distance:   Left Eye Distance:   Bilateral Distance:    Right Eye Near:   Left Eye Near:    Bilateral Near:     Physical Exam Vitals reviewed.  Constitutional:      General: She is awake. She is not in acute distress.    Appearance: Normal appearance. She is well-developed. She is not ill-appearing.     Comments: Very pleasant female appears stated age in no acute distress sitting comfortably in exam room  HENT:     Head: Normocephalic and atraumatic.  Cardiovascular:     Rate and Rhythm: Normal rate and regular rhythm.     Heart sounds: Normal heart sounds, S1 normal and S2 normal.  No murmur heard. Pulmonary:     Effort: Pulmonary effort is normal.     Breath sounds: Normal breath sounds. No wheezing, rhonchi or rales.     Comments: Clear to auscultation bilaterally Abdominal:     General: Bowel sounds are normal.     Palpations: Abdomen is soft.     Tenderness: There is abdominal tenderness in the suprapubic area. There is no right CVA tenderness, left CVA tenderness, guarding or rebound.     Comments: Mild tenderness palpation in lower abdomen.  No CVA tenderness.  Psychiatric:        Behavior: Behavior is cooperative.      UC Treatments / Results  Labs (all labs ordered are listed, but only abnormal results are displayed) Labs Reviewed  POCT URINALYSIS DIP (MANUAL ENTRY) - Abnormal; Notable for the following components:      Result Value    Color, UA orange (*)    Clarity, UA cloudy (*)    Glucose, UA =100 (*)    Bilirubin, UA small (*)    Ketones, POC UA trace (5) (*)    Spec Grav, UA >=1.030 (*)    Protein Ur, POC =100 (*)    Urobilinogen, UA 2.0 (*)    Nitrite, UA Positive (*)    Leukocytes, UA Trace (*)    All other components within normal limits  URINE CULTURE    EKG   Radiology No results found.  Procedures Procedures (including critical care time)  Medications Ordered in UC Medications  cefTRIAXone (ROCEPHIN) injection 1 g (1 g Intramuscular Given 09/23/23 1231)    Initial Impression / Assessment and Plan / UC Course  I have reviewed the triage vital signs and the nursing notes.  Pertinent labs & imaging results that were available during my care of the patient were reviewed by me and considered in my medical decision making (see chart for details).     Patient is well-appearing, afebrile, nontoxic, nontachycardic.  She has significant UTI symptoms.  Her UA was abnormal but she had also taken Azo.  Given she is symptomatic we will treat for UTI and she was given 1 g of Rocephin in clinic.  She was started on ciprofloxacin 500 mg twice daily for 5 days.  No indication for dose adjustment based on metabolic panel from 05/19/2023 with creatinine of 0.82 and EGFR of 73.56 mL extremity.  She does not have a history of prolonged QT with her last QTc 432 on 02/25/2023.  Will send her urine for culture and contact her if we need to change or discontinue her antibiotics based on culture results.  We discussed that if anything worsens or changes and she has high fever, worsening abdominal pain, hematuria, nausea/vomiting interfering with oral intake she needs to be seen immediately.  Recommend close follow-up with her primary care to have urine rechecked and ensure that hematuria noted today resolved with treatment of infection and discontinuation of Azo.  Strict return precautions given.  All questions answered to  patient satisfaction.  Final Clinical Impressions(s) / UC Diagnoses   Final diagnoses:  Acute UTI  Dysuria     Discharge Instructions      We are treating you for an urinary tract infection.  We gave you an injection of antibiotics today and I would like you to start ciprofloxacin twice daily for 5 days.  If you develop any joint pain, nausea, vomiting, elevated blood sugar stop the medication to be seen immediately.  Make sure you are drinking  plenty of fluid.  Follow-up with your primary care in a few weeks to ensure that your urine normalizes after we treat the infection.  If your symptoms are not improving within a few days or if anything worsens and you have fever, abdominal pain, blood in your urine, nausea/vomiting, weakness you need to go to the ER.  We will contact you if we need to stop or change your antibiotics based on culture results.     ED Prescriptions     Medication Sig Dispense Auth. Provider   ciprofloxacin (CIPRO) 500 MG tablet Take 1 tablet (500 mg total) by mouth every 12 (twelve) hours. 10 tablet Cloys Vera, Noberto Retort, PA-C      PDMP not reviewed this encounter.   Jeani Hawking, PA-C 09/23/23 1254

## 2023-09-25 LAB — URINE CULTURE: Culture: >=100000 — AB

## 2023-11-11 ENCOUNTER — Other Ambulatory Visit: Payer: Self-pay | Admitting: Family Medicine

## 2023-11-11 DIAGNOSIS — R002 Palpitations: Secondary | ICD-10-CM

## 2023-11-11 DIAGNOSIS — R000 Tachycardia, unspecified: Secondary | ICD-10-CM

## 2023-12-14 ENCOUNTER — Inpatient Hospital Stay (HOSPITAL_COMMUNITY)
Admission: EM | Admit: 2023-12-14 | Discharge: 2023-12-16 | DRG: 436 | Disposition: A | Discharge status: Home / Self Care | Attending: Internal Medicine | Admitting: Internal Medicine

## 2023-12-14 ENCOUNTER — Encounter (HOSPITAL_COMMUNITY): Payer: Self-pay

## 2023-12-14 ENCOUNTER — Other Ambulatory Visit: Payer: Self-pay | Admitting: *Deleted

## 2023-12-14 ENCOUNTER — Observation Stay (HOSPITAL_COMMUNITY)

## 2023-12-14 ENCOUNTER — Emergency Department (HOSPITAL_COMMUNITY)

## 2023-12-14 ENCOUNTER — Other Ambulatory Visit: Payer: Self-pay

## 2023-12-14 ENCOUNTER — Ambulatory Visit: Payer: Self-pay

## 2023-12-14 DIAGNOSIS — C23 Malignant neoplasm of gallbladder: Secondary | ICD-10-CM | POA: Diagnosis not present

## 2023-12-14 DIAGNOSIS — R7303 Prediabetes: Secondary | ICD-10-CM

## 2023-12-14 DIAGNOSIS — Z888 Allergy status to other drugs, medicaments and biological substances status: Secondary | ICD-10-CM

## 2023-12-14 DIAGNOSIS — I2510 Atherosclerotic heart disease of native coronary artery without angina pectoris: Secondary | ICD-10-CM | POA: Diagnosis present

## 2023-12-14 DIAGNOSIS — K5900 Constipation, unspecified: Secondary | ICD-10-CM | POA: Diagnosis present

## 2023-12-14 DIAGNOSIS — M51369 Other intervertebral disc degeneration, lumbar region without mention of lumbar back pain or lower extremity pain: Secondary | ICD-10-CM | POA: Diagnosis present

## 2023-12-14 DIAGNOSIS — Z66 Do not resuscitate: Secondary | ICD-10-CM | POA: Diagnosis present

## 2023-12-14 DIAGNOSIS — F32A Depression, unspecified: Secondary | ICD-10-CM | POA: Diagnosis present

## 2023-12-14 DIAGNOSIS — E782 Mixed hyperlipidemia: Secondary | ICD-10-CM

## 2023-12-14 DIAGNOSIS — E559 Vitamin D deficiency, unspecified: Secondary | ICD-10-CM

## 2023-12-14 DIAGNOSIS — F411 Generalized anxiety disorder: Secondary | ICD-10-CM | POA: Diagnosis present

## 2023-12-14 DIAGNOSIS — I10 Essential (primary) hypertension: Secondary | ICD-10-CM

## 2023-12-14 DIAGNOSIS — Z860100 Personal history of colon polyps, unspecified: Secondary | ICD-10-CM

## 2023-12-14 DIAGNOSIS — Z806 Family history of leukemia: Secondary | ICD-10-CM

## 2023-12-14 DIAGNOSIS — K219 Gastro-esophageal reflux disease without esophagitis: Secondary | ICD-10-CM | POA: Diagnosis present

## 2023-12-14 DIAGNOSIS — C786 Secondary malignant neoplasm of retroperitoneum and peritoneum: Principal | ICD-10-CM | POA: Diagnosis present

## 2023-12-14 DIAGNOSIS — Z7983 Long term (current) use of bisphosphonates: Secondary | ICD-10-CM

## 2023-12-14 DIAGNOSIS — Z79899 Other long term (current) drug therapy: Secondary | ICD-10-CM

## 2023-12-14 DIAGNOSIS — I700 Atherosclerosis of aorta: Secondary | ICD-10-CM | POA: Diagnosis present

## 2023-12-14 DIAGNOSIS — Z981 Arthrodesis status: Secondary | ICD-10-CM

## 2023-12-14 DIAGNOSIS — R1013 Epigastric pain: Secondary | ICD-10-CM

## 2023-12-14 DIAGNOSIS — Z1159 Encounter for screening for other viral diseases: Secondary | ICD-10-CM

## 2023-12-14 DIAGNOSIS — J4489 Other specified chronic obstructive pulmonary disease: Secondary | ICD-10-CM | POA: Diagnosis present

## 2023-12-14 DIAGNOSIS — K828 Other specified diseases of gallbladder: Secondary | ICD-10-CM

## 2023-12-14 DIAGNOSIS — Z6831 Body mass index (BMI) 31.0-31.9, adult: Secondary | ICD-10-CM

## 2023-12-14 DIAGNOSIS — G8929 Other chronic pain: Secondary | ICD-10-CM | POA: Diagnosis present

## 2023-12-14 DIAGNOSIS — R109 Unspecified abdominal pain: Principal | ICD-10-CM | POA: Diagnosis present

## 2023-12-14 DIAGNOSIS — E876 Hypokalemia: Secondary | ICD-10-CM | POA: Diagnosis present

## 2023-12-14 DIAGNOSIS — Z7982 Long term (current) use of aspirin: Secondary | ICD-10-CM

## 2023-12-14 DIAGNOSIS — M549 Dorsalgia, unspecified: Secondary | ICD-10-CM | POA: Diagnosis present

## 2023-12-14 DIAGNOSIS — M1712 Unilateral primary osteoarthritis, left knee: Secondary | ICD-10-CM | POA: Diagnosis present

## 2023-12-14 DIAGNOSIS — G4700 Insomnia, unspecified: Secondary | ICD-10-CM | POA: Diagnosis present

## 2023-12-14 DIAGNOSIS — Z515 Encounter for palliative care: Secondary | ICD-10-CM

## 2023-12-14 DIAGNOSIS — E66811 Obesity, class 1: Secondary | ICD-10-CM | POA: Diagnosis present

## 2023-12-14 DIAGNOSIS — D630 Anemia in neoplastic disease: Secondary | ICD-10-CM | POA: Diagnosis present

## 2023-12-14 DIAGNOSIS — Z87891 Personal history of nicotine dependence: Secondary | ICD-10-CM

## 2023-12-14 DIAGNOSIS — Z882 Allergy status to sulfonamides status: Secondary | ICD-10-CM

## 2023-12-14 DIAGNOSIS — Z90711 Acquired absence of uterus with remaining cervical stump: Secondary | ICD-10-CM

## 2023-12-14 LAB — COMPREHENSIVE METABOLIC PANEL WITH GFR
ALT: 16 U/L (ref 0–44)
AST: 19 U/L (ref 15–41)
Albumin: 3.5 g/dL (ref 3.5–5.0)
Alkaline Phosphatase: 68 U/L (ref 38–126)
Anion gap: 8 (ref 5–15)
BUN: 7 mg/dL — ABNORMAL LOW (ref 8–23)
CO2: 23 mmol/L (ref 22–32)
Calcium: 9 mg/dL (ref 8.9–10.3)
Chloride: 108 mmol/L (ref 98–111)
Creatinine, Ser: 0.66 mg/dL (ref 0.44–1.00)
GFR, Estimated: >60 mL/min (ref >60)
Glucose, Bld: 119 mg/dL — ABNORMAL HIGH (ref 70–99)
Potassium: 4.1 mmol/L (ref 3.5–5.1)
Sodium: 139 mmol/L (ref 135–145)
Total Bilirubin: 0.8 mg/dL (ref 0.0–1.2)
Total Protein: 6.3 g/dL — ABNORMAL LOW (ref 6.5–8.1)

## 2023-12-14 LAB — CBC
HCT: 40.6 % (ref 36.0–46.0)
HCT: 37.1 % (ref 36.0–46.0)
Hemoglobin: 13.3 g/dL (ref 12.0–15.0)
Hemoglobin: 12.1 g/dL (ref 12.0–15.0)
MCH: 29.6 pg (ref 26.0–34.0)
MCH: 29.1 pg (ref 26.0–34.0)
MCHC: 32.8 g/dL (ref 30.0–36.0)
MCHC: 32.6 g/dL (ref 30.0–36.0)
MCV: 90.2 fL (ref 80.0–100.0)
MCV: 89.2 fL (ref 80.0–100.0)
Platelets: 215 10*3/uL (ref 150–400)
Platelets: 195 10*3/uL (ref 150–400)
RBC: 4.5 MIL/uL (ref 3.87–5.11)
RBC: 4.16 MIL/uL (ref 3.87–5.11)
RDW: 13 % (ref 11.5–15.5)
RDW: 13 % (ref 11.5–15.5)
WBC: 5.2 10*3/uL (ref 4.0–10.5)
WBC: 5 10*3/uL (ref 4.0–10.5)
nRBC: 0 % (ref 0.0–0.2)
nRBC: 0 % (ref 0.0–0.2)

## 2023-12-14 LAB — LIPASE, BLOOD: Lipase: 35 U/L (ref 11–51)

## 2023-12-14 LAB — URINALYSIS, ROUTINE W REFLEX MICROSCOPIC
Bilirubin Urine: NEGATIVE
Glucose, UA: NEGATIVE mg/dL
Hgb urine dipstick: NEGATIVE
Ketones, ur: NEGATIVE mg/dL
Leukocytes,Ua: NEGATIVE
Nitrite: NEGATIVE
Protein, ur: NEGATIVE mg/dL
Specific Gravity, Urine: 1.023 (ref 1.005–1.030)
pH: 5 (ref 5.0–8.0)

## 2023-12-14 LAB — PROTIME-INR
INR: 1.1 (ref 0.8–1.2)
Prothrombin Time: 14.4 s (ref 11.4–15.2)

## 2023-12-14 LAB — CREATININE, SERUM
Creatinine, Ser: 0.63 mg/dL (ref 0.44–1.00)
GFR, Estimated: >60 mL/min (ref >60)

## 2023-12-14 MED ORDER — ARFORMOTEROL TARTRATE 15 MCG/2ML IN NEBU
15.0000 ug | INHALATION_SOLUTION | Freq: Two times a day (BID) | RESPIRATORY_TRACT | Status: DC
  Administered 2023-12-14 – 2023-12-16 (×4): 15 ug via RESPIRATORY_TRACT
  Filled 2023-12-14 (×4): qty 2

## 2023-12-14 MED ORDER — ACETAMINOPHEN 650 MG RE SUPP: 650.0000 mg | Freq: Four times a day (QID) | RECTAL | Status: DC | PRN

## 2023-12-14 MED ORDER — HYDRALAZINE HCL 20 MG/ML IJ SOLN: 10.0000 mg | INTRAMUSCULAR | Status: DC | PRN

## 2023-12-14 MED ORDER — OXYCODONE HCL 5 MG PO TABS
5.0000 mg | ORAL_TABLET | ORAL | Status: DC | PRN
  Administered 2023-12-14: 5 mg via ORAL
  Filled 2023-12-14: qty 1

## 2023-12-14 MED ORDER — GUAIFENESIN 100 MG/5ML PO LIQD: 5.0000 mL | ORAL | Status: DC | PRN

## 2023-12-14 MED ORDER — FENTANYL CITRATE PF 50 MCG/ML IJ SOSY
50.0000 ug | PREFILLED_SYRINGE | INTRAMUSCULAR | Status: DC | PRN
  Administered 2023-12-14: 50 ug via INTRAVENOUS
  Filled 2023-12-14 (×3): qty 1

## 2023-12-14 MED ORDER — METOPROLOL SUCCINATE ER 50 MG PO TB24
50.0000 mg | ORAL_TABLET | Freq: Every day | ORAL | Status: DC
  Administered 2023-12-15 – 2023-12-16 (×2): 50 mg via ORAL
  Filled 2023-12-14 (×2): qty 1

## 2023-12-14 MED ORDER — FENTANYL CITRATE PF 50 MCG/ML IJ SOSY
50.0000 ug | PREFILLED_SYRINGE | Freq: Once | INTRAMUSCULAR | Status: AC
  Administered 2023-12-14: 50 ug via INTRAVENOUS

## 2023-12-14 MED ORDER — IOHEXOL 300 MG/ML  SOLN
100.0000 mL | Freq: Once | INTRAMUSCULAR | Status: AC | PRN
  Administered 2023-12-14: 100 mL via INTRAVENOUS

## 2023-12-14 MED ORDER — SENNOSIDES-DOCUSATE SODIUM 8.6-50 MG PO TABS
1.0000 | ORAL_TABLET | Freq: Every evening | ORAL | Status: DC | PRN
Start: 2023-12-14 — End: 2023-12-15

## 2023-12-14 MED ORDER — METOPROLOL TARTRATE 5 MG/5ML IV SOLN: 5.0000 mg | INTRAVENOUS | Status: DC | PRN

## 2023-12-14 MED ORDER — PANTOPRAZOLE SODIUM 40 MG PO TBEC
40.0000 mg | DELAYED_RELEASE_TABLET | Freq: Every day | ORAL | Status: DC
  Administered 2023-12-15 – 2023-12-16 (×2): 40 mg via ORAL
  Filled 2023-12-14 (×2): qty 1

## 2023-12-14 MED ORDER — REVEFENACIN 175 MCG/3ML IN SOLN
175.0000 ug | Freq: Every day | RESPIRATORY_TRACT | Status: DC
  Administered 2023-12-15 – 2023-12-16 (×2): 175 ug via RESPIRATORY_TRACT
  Filled 2023-12-14 (×2): qty 3

## 2023-12-14 MED ORDER — SODIUM CHLORIDE (PF) 0.9 % IJ SOLN
INTRAMUSCULAR | Status: AC
Start: 2023-12-14 — End: ?
  Filled 2023-12-14: qty 50

## 2023-12-14 MED ORDER — ONDANSETRON HCL 4 MG PO TABS: 4.0000 mg | ORAL_TABLET | Freq: Four times a day (QID) | ORAL | Status: DC | PRN

## 2023-12-14 MED ORDER — ACETAMINOPHEN 325 MG PO TABS: 650.0000 mg | ORAL_TABLET | Freq: Four times a day (QID) | ORAL | Status: DC | PRN

## 2023-12-14 MED ORDER — ROSUVASTATIN CALCIUM 10 MG PO TABS
10.0000 mg | ORAL_TABLET | Freq: Every day | ORAL | Status: DC
  Administered 2023-12-15 – 2023-12-16 (×2): 10 mg via ORAL
  Filled 2023-12-14: qty 1
  Filled 2023-12-14 (×2): qty 0.5
  Filled 2023-12-14: qty 1
  Filled 2023-12-14: qty 2

## 2023-12-14 MED ORDER — NICOTINE 21 MG/24HR TD PT24
21.0000 mg | MEDICATED_PATCH | Freq: Every day | TRANSDERMAL | Status: DC | PRN
Start: 2023-12-14 — End: 2023-12-16

## 2023-12-14 MED ORDER — GADOBUTROL 1 MMOL/ML IV SOLN
7.0000 mL | Freq: Once | INTRAVENOUS | Status: AC | PRN
  Administered 2023-12-14: 7 mL via INTRAVENOUS

## 2023-12-14 MED ORDER — HYDROMORPHONE HCL 1 MG/ML IJ SOLN
0.5000 mg | INTRAMUSCULAR | Status: DC | PRN
  Administered 2023-12-15: 0.5 mg via INTRAVENOUS
  Administered 2023-12-15 (×2): 1 mg via INTRAVENOUS
  Filled 2023-12-14 (×3): qty 1

## 2023-12-14 MED ORDER — ENOXAPARIN SODIUM 40 MG/0.4ML IJ SOSY: 40.0000 mg | PREFILLED_SYRINGE | INTRAMUSCULAR | Status: DC

## 2023-12-14 MED ORDER — ONDANSETRON HCL 4 MG/2ML IJ SOLN
4.0000 mg | Freq: Four times a day (QID) | INTRAMUSCULAR | Status: DC | PRN
  Administered 2023-12-14 – 2023-12-15 (×3): 4 mg via INTRAVENOUS
  Filled 2023-12-14 (×3): qty 2

## 2023-12-14 MED ORDER — BUPROPION HCL ER (SR) 100 MG PO TB12
200.0000 mg | ORAL_TABLET | Freq: Two times a day (BID) | ORAL | Status: DC
  Administered 2023-12-14 – 2023-12-16 (×4): 200 mg via ORAL
  Filled 2023-12-14 (×4): qty 2

## 2023-12-14 MED ORDER — IPRATROPIUM-ALBUTEROL 0.5-2.5 (3) MG/3ML IN SOLN: 3.0000 mL | RESPIRATORY_TRACT | Status: DC | PRN

## 2023-12-14 MED ORDER — DEXTROSE-SODIUM CHLORIDE 5-0.45 % IV SOLN: INTRAVENOUS | Status: AC

## 2023-12-14 MED ORDER — TRAZODONE HCL 100 MG PO TABS
50.0000 mg | ORAL_TABLET | Freq: Every evening | ORAL | Status: DC | PRN
  Administered 2023-12-15: 50 mg via ORAL
  Filled 2023-12-14: qty 1

## 2023-12-14 NOTE — Plan of Care (Signed)

## 2023-12-14 NOTE — Consult Note (Signed)
 Gynecologic Oncology Consultation  Janice Pace 78 y.o. female  CC:  Chief Complaint  Patient presents with   Abdominal Pain   Constipation    HPI: Janice Pace is a 78 year old female who presented to the [facility] ER on 12/14/2023 with upper abdominal pain and constipation for the past two weeks. Pain is described in ER notes as constant, worse at nighttime. She also reported decreased appetite and frequent belching. Labs in the ER included lipase at 35, Cmet, normal CBC, UA with hazy urine. She underwent an abdominal US  returning with: 1. The gallbladder was not well visualized or evaluated during the exam; however, the sonographer reports right upper quadrant pain in this region. A follow-up CT of the abdomen and pelvis with IV contrast could be considered for further characterization. 2. No intrahepatic or extrahepatic biliary ductal dilation.  This was followed by a CT AP with contrast revealing: *Multiple pulmonary nodules in the lower lobes bilaterally suspicious for metastatic lung disease. *Irregular appearance of the gallbladder with a suggestion of capsular thickening along the fundus of the gallbladder. In addition the gallbladder appears inseparable from the adjacent portion of the liver, the possibility of gallbladder carcinoma should be considered and excluded clinically. Correlation with recent ultrasound demonstrates poor visualization of the gallbladder. The findings are not typical for acute cholecystitis in the CT images. Correlation with MR and MRCP is recommended. *Mesenteric nodular thickening along the mid lower abdominal cavity may suggest peritoneal carcinomatosis. *Soft tissue nodular mass in the left lower quadrant region of the pelvis measuring 3.6 x 2.7 cm this could correlate with an ovarian neoplastic lesion. Again not present on prior examination 2024. *Extensive postsurgical changes of the thoracolumbar spine with intervertebral disc replacements L2-L3 L4 and  L5.  Tumor markers CA 19-9, CEA, CA 125 were recommended along with a MR abdomen with MRCP. MR abdomen MRCP performed 12/14/23 returned with: 1. Thickened, contracted gallbladder with a heterogeneous mass arising from the gallbladder fundus, contiguous with the adjacent liver parenchyma of hepatic segment IVB measuring 3.5 x 2.8 cm. 2. Multiple small rim enhancing lesions within the liver parenchyma. 3. Enlarged, necrotic appearing portacaval and porta hepatis lymph nodes. Small, although rim enhancing and abnormal appearing retroperitoneal lymph nodes. 4. Peritoneal stranding and nodularity, incompletely visualized on this examination although present in the left upper quadrant, consistent with peritoneal metastatic disease. 5. Numerous small bilateral pulmonary nodules, better assessed by CT. 6. 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  Past medical history includes arthritis, asthma, chronic back pain, COPD, heart murmur and valve disorder, hx hiatal hernia, insomnia, pre-diabetes. Past surgical history includes tubal ligation, hysterectomy for endometriosis, multiple back surgeries, wrist surgery, rectocele repair.    Interval History:  Came in with abdominal pain x 2 weeks (right upper abd). No nausea or emesis at home. Constipation for past 2 weeks. Has tried laxative and mushroom coffee which helped. No vaginal bleeding. No pelvic pain. Has had bloating x 2 weeks. Has been trying to lose weight and was at 200 lbs and now at 169 lb since last year (10 mths). Was put on two different medications to help lose weight which has curbed her appetite. Does not eat as much. G3, 6 grandchildren. All vaginal deliveries. Had hysterectomy for endometriosis. Ovaries remained. No hx of abnormal pap smears. Husband at the bedside. Currently nauseated after taking medication on an empty stomach.   Review of Systems: See interval.  Current Meds:  Current inpatient and  outpatient medications reviewed  Allergy:  Allergies  Allergen Reactions   Sulfa Antibiotics Hives and Itching   Atorvastatin Other (See Comments)    mucsle aches   Metformin Hcl Other (See Comments)    joint pains and muscle aches    Social Hx:   Social History   Socioeconomic History   Marital status: Married    Spouse name: david   Number of children: 3   Years of education: Not on file   Highest education level: GED or equivalent  Occupational History   Occupation: retired  Tobacco Use   Smoking status: Former    Current packs/day: 0.00    Average packs/day: 1 pack/day for 50.0 years (50.0 ttl pk-yrs)    Types: Cigarettes    Start date: 08/16/1961    Quit date: 08/17/2011    Years since quitting: 12.3    Passive exposure: Never   Smokeless tobacco: Never  Vaping Use   Vaping status: Never Used  Substance and Sexual Activity   Alcohol use: No    Comment: occasional wine   Drug use: No   Sexual activity: Yes    Birth control/protection: Surgical, Other-see comments  Other Topics Concern   Not on file  Social History Narrative   ** Merged History Encounter **       Social Drivers of Health   Financial Resource Strain: Low Risk  (08/25/2023)   Overall Financial Resource Strain (CARDIA)    Difficulty of Paying Living Expenses: Not hard at all  Food Insecurity: No Food Insecurity (08/25/2023)   Hunger Vital Sign    Worried About Running Out of Food in the Last Year: Never true    Ran Out of Food in the Last Year: Never true  Transportation Needs: No Transportation Needs (08/25/2023)   PRAPARE - Administrator, Civil Service (Medical): No    Lack of Transportation (Non-Medical): No  Physical Activity: Inactive (08/25/2023)   Exercise Vital Sign    Days of Exercise per Week: 0 days    Minutes of Exercise per Session: 0 min  Stress: No Stress Concern Present (08/25/2023)   [employer] of Occupational Health - Occupational  Stress Questionnaire    Feeling of Stress : Not at all  Social Connections: Socially Integrated (08/25/2023)   Social Connection and Isolation Panel [NHANES]    Frequency of Communication with Friends and Family: More than three times a week    Frequency of Social Gatherings with Friends and Family: Once a week    Attends Religious Services: More than 4 times per year    Active Member of [employer] or Organizations: Yes    Attends Engineer, structural: More than 4 times per year    Marital Status: Married  Catering manager Violence: Not At Risk (06/03/2023)   Humiliation, Afraid, Rape, and Kick questionnaire    Fear of Current or Ex-Partner: No    Emotionally Abused: No    Physically Abused: No    Sexually Abused: No    Past Surgical Hx:  Past Surgical History:  Procedure Laterality Date   ABDOMINAL HYSTERECTOMY     ANTERIOR LAT LUMBAR FUSION N/A 02/27/2021   Procedure: Anterior lateral interbody fusion - lateral two - lateral three - lateral three - lateral four with exploration fusion L4-S1;  Surgeon: Gearl Keens, MD;  Location: [facility] OR;  Service: Neurosurgery;  Laterality: N/A;   BACK SURGERY  1988   BACK SURGERY  09/03/2011   rods,screws x 8  BACK SURGERY     09/2022   BUNIONECTOMY     bilateral   BUNIONECTOMY     bil feet   COLONOSCOPY     dermatosibrosarcoma protuberans     LAMINECTOMY WITH POSTERIOR LATERAL ARTHRODESIS LEVEL 3 N/A 02/27/2021   Procedure: Posterior augmentation with pedicle screws Latreal one - lateral four - Posterior Lateral and Interbody fusion;  Surgeon: Gearl Keens, MD;  Location: [facility] OR;  Service: Neurosurgery;  Laterality: N/A;   PARTIAL HYSTERECTOMY     RECTOCELE REPAIR     Rectum Repair     RESECTION TUMOR WRIST RADICAL     x 2 rt   TUBAL LIGATION  1974   TUBAL LIGATION     WRIST SURGERY     left    Past Medical Hx:  Past Medical History:  Diagnosis Date   Arthritis    Asthma    Bronchitis    Cancer (HCC)    dermatosivros arcoma  and protuverans   Chronic back pain    degenerative disc disease   Constipation    stool softener daily   Constipation    COPD (chronic obstructive pulmonary disease) (HCC)    GERD (gastroesophageal reflux disease)    takes Prilosec daily   GERD (gastroesophageal reflux disease)    Heart murmur    Heart valve disorder    Hemorrhoids    History of hiatal hernia    Insomnia    d/t chantix    Joint pain    Nocturia    Personal history of colonic adenoma 03/13/2003   03/13/2003 - 5 mm adenoma   Pre-diabetes    Prediabetes    White coat syndrome without diagnosis of hypertension     Family Hx:  Family History  Problem Relation Age of Onset   Leukemia Mother    Anesthesia problems Neg Hx    Hypotension Neg Hx    Malignant hyperthermia Neg Hx    Pseudochol deficiency Neg Hx    Colon cancer Neg Hx    Colon polyps Neg Hx    Esophageal cancer Neg Hx    Stomach cancer Neg Hx    Rectal cancer Neg Hx     Vitals:  Blood pressure 137/63, pulse (!) 58, temperature 98 F (36.7 C), temperature source Oral, resp. rate 16, SpO2 99%.  Physical Exam:  Alert, oriented, in no acute distress, sitting on side of bed, husband at the bedside. Breathing unlabored.  Assessment/Plan: 79 year old female currently admitted with evidence of widespread metastatic disease on imaging. She is s/p IR biopsy today. Dr. Daisey Dryer had discussion with patient and husband. Suspect gallbladder as primary source. Tumor markers pending. Will await results of biopsy taken today. If gynecologic in nature, given extent of disease, surgery would not be advised with the recommendation being for systemic chemotherapy treatment. All questions answered. No needs voiced at this time. Continue with current plan of care.   Suellyn Emory, NP 12/14/2023, 4:08 PM

## 2023-12-14 NOTE — H&P (Signed)
 History and Physical    Janice Pace:[id number] DOB: 11/28/1945 DOA: 12/14/2023  PCP: Odilia Bennett, PA-C Patient coming from: Home  Chief Complaint: Abd pain  HPI: Janice Pace is a 78 y.o. female with medical history significant of essential hypertension, osteoarthritis, COPD/asthma, GERD, depression, anxiety, hiatal hernia, chronic back pain comes to the hospital complains of abdominal pain.  Patient states she started having abdominal pain which has been constant in the epigastric region for about past 2 weeks.  Denies any nausea, vomiting, fever, weight loss, chills and any other complaints.  For the most part she has been able to tolerate p.o.  Colonoscopy 2024 showing multiple polyps which were removed with cold snare Had hysterectomy about 50 years ago Reports that she is not up to date with her GYN screening  In the ER today her routine blood work was unremarkable with CT abdomen pelvis showed multiple pleural nodules, abnormal gallbladder pathology and peritoneal carcinomatosis.  Medical team requested to admit this patient.   Review of Systems: As per HPI otherwise 10 point review of systems negative.  Review of Systems Otherwise negative except as per HPI, including: General: Denies fever, chills, night sweats or unintended weight loss. Resp: Denies cough, wheezing, shortness of breath. Cardiac: Denies chest pain, palpitations, orthopnea, paroxysmal nocturnal dyspnea. GI: Denies  nausea, vomiting, diarrhea or constipation GU: Denies dysuria, frequency, hesitancy or incontinence MS: Denies muscle aches, joint pain or swelling Neuro: Denies headache, neurologic deficits (focal weakness, numbness, tingling), abnormal gait Psych: Denies anxiety, depression, SI/HI/AVH Skin: Denies new rashes or lesions ID: Denies sick contacts, exotic exposures, travel  Past Medical History:  Diagnosis Date   Arthritis    Asthma    Bronchitis    Cancer (HCC)    dermatosivros arcoma  and protuverans   Chronic back pain    degenerative disc disease   Constipation    stool softener daily   Constipation    COPD (chronic obstructive pulmonary disease) (HCC)    GERD (gastroesophageal reflux disease)    takes Prilosec daily   GERD (gastroesophageal reflux disease)    Heart murmur    Heart valve disorder    Hemorrhoids    History of hiatal hernia    Insomnia    d/t chantix    Joint pain    Nocturia    Personal history of colonic adenoma 03/13/2003   03/13/2003 - 5 mm adenoma   Pre-diabetes    Prediabetes    White coat syndrome without diagnosis of hypertension     Past Surgical History:  Procedure Laterality Date   ABDOMINAL HYSTERECTOMY     ANTERIOR LAT LUMBAR FUSION N/A 02/27/2021   Procedure: Anterior lateral interbody fusion - lateral two - lateral three - lateral three - lateral four with exploration fusion L4-S1;  Surgeon: Gearl Keens, MD;  Location: [facility] OR;  Service: Neurosurgery;  Laterality: N/A;   BACK SURGERY  1988   BACK SURGERY  09/03/2011   rods,screws x 8    BACK SURGERY     09/2022   BUNIONECTOMY     bilateral   BUNIONECTOMY     bil feet   COLONOSCOPY     dermatosibrosarcoma protuberans     LAMINECTOMY WITH POSTERIOR LATERAL ARTHRODESIS LEVEL 3 N/A 02/27/2021   Procedure: Posterior augmentation with pedicle screws Latreal one - lateral four - Posterior Lateral and Interbody fusion;  Surgeon: Gearl Keens, MD;  Location: [facility] OR;  Service: Neurosurgery;  Laterality: N/A;   PARTIAL HYSTERECTOMY  RECTOCELE REPAIR     Rectum Repair     RESECTION TUMOR WRIST RADICAL     x 2 rt   TUBAL LIGATION  1974   TUBAL LIGATION     WRIST SURGERY     left    SOCIAL HISTORY:  reports that she quit smoking about 12 years ago. Her smoking use included cigarettes. She started smoking about 62 years ago. She has a 50 pack-year smoking history. She has never been exposed to tobacco smoke. She has never used smokeless tobacco. She reports that she does not drink  alcohol and does not use drugs.  Allergies  Allergen Reactions   Sulfa Antibiotics Hives and Itching   Atorvastatin Other (See Comments)    mucsle aches   Metformin Hcl Other (See Comments)    joint pains and muscle aches    FAMILY HISTORY: Family History  Problem Relation Age of Onset   Leukemia Mother    Anesthesia problems Neg Hx    Hypotension Neg Hx    Malignant hyperthermia Neg Hx    Pseudochol deficiency Neg Hx    Colon cancer Neg Hx    Colon polyps Neg Hx    Esophageal cancer Neg Hx    Stomach cancer Neg Hx    Rectal cancer Neg Hx      Prior to Admission medications   Medication Sig Start Date End Date Taking? Authorizing Provider  albuterol  (VENTOLIN  HFA) 108 (90 Base) MCG/ACT inhaler Inhale 1-2 puffs into the lungs every 6 (six) hours as needed for wheezing or shortness of breath. 05/06/22   Cobb, Mariah Shines, NP  alendronate  (FOSAMAX ) 70 MG tablet Take 1 tablet (70 mg total) by mouth once a week. Take with a full glass of water on an empty stomach. 08/26/23   Noreene Bearded, PA  aspirin  81 MG EC tablet Take 1 tablet by mouth daily.    [provider]  budesonide  (PULMICORT ) 0.5 MG/2ML nebulizer solution Generic for Pulmicort . Inhale one vial in nebulizer twice a day. Rinse mouth after use. 01/19/23   Wilfredo Hanly, MD  buPROPion  ER (WELLBUTRIN  SR) 100 MG 12 hr tablet Take 2 tablets (200 mg total) by mouth 2 (two) times daily. 09/25/23   Noreene Bearded, PA  chlordiazePOXIDE  (LIBRIUM ) 10 MG capsule Take 1 capsule (10 mg total) by mouth as needed for anxiety. Maximum twice daily. 09/25/23   Noreene Bearded, PA  ciprofloxacin  (CIPRO ) 500 MG tablet Take 1 tablet (500 mg total) by mouth every 12 (twelve) hours. 09/23/23   Raspet, Erin K, PA-C  docusate sodium  (COLACE) 100 MG capsule Take 500 mg by mouth at bedtime.    [provider]  formoterol  (PERFOROMIST ) 20 MCG/2ML nebulizer solution Substituted for: Perforomist  Solution Inhale one vial in  nebulizer twice a day. 01/19/23   Wilfredo Hanly, MD  metoprolol  succinate (TOPROL -XL) 50 MG 24 hr tablet TAKE 1 TABLET BY MOUTH ONCE DAILY. TAKE WITH OR IMMEDIATELY FOLLOWING A MEAL 11/11/23   Laneta Pintos, MD  naltrexone  (DEPADE) 50 MG tablet Take 0.5 tablets (25 mg total) by mouth daily. 06/25/23   Noreene Bearded, PA  omeprazole  (PRILOSEC) 20 MG capsule Take 1 capsule (20 mg total) by mouth daily as needed (acid reflux). 06/25/23   Noreene Bearded, PA  revefenacin  (YUPELRI ) 175 MCG/3ML nebulizer solution Take 3 mLs (175 mcg total) by nebulization daily. 02/04/23   Wilfredo Hanly, MD  rosuvastatin  (CRESTOR ) 10 MG tablet Take 1 tablet (10 mg  total) by mouth daily. 08/26/23   Noreene Bearded, PA  spironolactone  (ALDACTONE ) 25 MG tablet TAKE 1 TABLET BY MOUTH ONCE DAILY IN THE MORNING 05/19/23   Knox Perl, MD    Physical Exam: Vitals:   12/14/23 1011 12/14/23 1328  BP: (!) 150/91 137/63  Pulse: 81 (!) 58  Resp: 17 16  Temp: 98.3 F (36.8 C) 98 F (36.7 C)  TempSrc:  Oral  SpO2: 97% 99%      Constitutional: NAD, calm, comfortable Eyes: PERRL, lids and conjunctivae normal ENMT: Mucous membranes are moist. Posterior pharynx clear of any exudate or lesions.Normal dentition.  Neck: normal, supple, no masses, no thyromegaly Respiratory: clear to auscultation bilaterally, no wheezing, no crackles. Normal respiratory effort. No accessory muscle use.  Cardiovascular: Regular rate and rhythm, no murmurs / rubs / gallops. No extremity edema. 2+ pedal pulses. No carotid bruits.  Abdomen: no tenderness, no masses palpated. No hepatosplenomegaly. Bowel sounds positive.  Musculoskeletal: no clubbing / cyanosis. No joint deformity upper and lower extremities. Good ROM, no contractures. Normal muscle tone.  Skin: no rashes, lesions, ulcers. No induration Neurologic: CN 2-12 grossly intact. Sensation intact, DTR normal. Strength 5/5 in all 4.  Psychiatric: Normal judgment and insight.  Alert and oriented x 3. Normal mood.    There is no height or weight on file to calculate BMI.      Labs on Admission: I have personally reviewed following labs and imaging studies  CBC: Recent Labs  Lab 12/14/23 1044  WBC 5.2  HGB 13.3  HCT 40.6  MCV 90.2  PLT 215   Basic Metabolic Panel: Recent Labs  Lab 12/14/23 1044  NA 139  K 4.1  CL 108  CO2 23  GLUCOSE 119*  BUN 7*  CREATININE 0.66  CALCIUM  9.0   GFR: CrCl cannot be calculated (Unknown ideal weight.). Liver Function Tests: Recent Labs  Lab 12/14/23 1044  AST 19  ALT 16  ALKPHOS 68  BILITOT 0.8  PROT 6.3*  ALBUMIN  3.5   Recent Labs  Lab 12/14/23 1044  LIPASE 35   No results for input(s): "AMMONIA" in the last 168 hours. Coagulation Profile: No results for input(s): "INR", "PROTIME" in the last 168 hours. Cardiac Enzymes: No results for input(s): "CKTOTAL", "CKMB", "CKMBINDEX", "TROPONINI" in the last 168 hours. BNP (last 3 results) No results for input(s): "PROBNP" in the last 8760 hours. HbA1C: No results for input(s): "HGBA1C" in the last 72 hours. CBG: No results for input(s): "GLUCAP" in the last 168 hours. Lipid Profile: No results for input(s): "CHOL", "HDL", "LDLCALC", "TRIG", "CHOLHDL", "LDLDIRECT" in the last 72 hours. Thyroid  Function Tests: No results for input(s): "TSH", "T4TOTAL", "FREET4", "T3FREE", "THYROIDAB" in the last 72 hours. Anemia Panel: No results for input(s): "VITAMINB12", "FOLATE", "FERRITIN", "TIBC", "IRON", "RETICCTPCT" in the last 72 hours. Urine analysis:    Component Value Date/Time   COLORURINE YELLOW 12/14/2023 1112   APPEARANCEUR HAZY (A) 12/14/2023 1112   LABSPEC 1.023 12/14/2023 1112   PHURINE 5.0 12/14/2023 1112   GLUCOSEU NEGATIVE 12/14/2023 1112   HGBUR NEGATIVE 12/14/2023 1112   BILIRUBINUR NEGATIVE 12/14/2023 1112   BILIRUBINUR small (A) 09/23/2023 1109   BILIRUBINUR negative 08/26/2023 0944   KETONESUR NEGATIVE 12/14/2023 1112   PROTEINUR  NEGATIVE 12/14/2023 1112   UROBILINOGEN 2.0 (A) 09/23/2023 1109   NITRITE NEGATIVE 12/14/2023 1112   LEUKOCYTESUR NEGATIVE 12/14/2023 1112   Sepsis Labs: !!!!!!!!!!!!!!!!!!!!!!!!!!!!!!!!!!!!!!!!!!!! @LABRCNTIP (procalcitonin:4,lacticidven:4) )No results found for this or any previous visit (from the past 240 hours).  Radiological Exams on Admission: CT ABDOMEN PELVIS W CONTRAST Result Date: 12/14/2023 CLINICAL DATA:  Right upper quadrant abdominal pain EXAM: CT ABDOMEN AND PELVIS WITH CONTRAST TECHNIQUE: Multidetector CT imaging of the abdomen and pelvis was performed using the standard protocol following bolus administration of intravenous contrast. RADIATION DOSE REDUCTION: This exam was performed according to the departmental dose-optimization program which includes automated exposure control, adjustment of the mA and/or kV according to patient size and/or use of iterative reconstruction technique. CONTRAST:  OMNIPAQUE  IOHEXOL  300 MG/ML  SOLN COMPARISON:  Nov 19, 2022 FINDINGS: Lower chest: Several nodules in both lower lobes are identified, measuring between 3 and 4 mm in the right lower lobe and 3 and 5 mm in the lingula, as well as multiple nodules in the left lower lobe posteromedial segment the largest 9 mm in diameter on image number 8. And in the right lower lobe image number 25 measuring 12 mm in diameter. These nodules were not identified on the prior CT from 2024 there are 4 suspicious for metastatic lung disease. Hepatobiliary: Liver appears normal in size no parenchymal lesions however, gallbladder appears irregular, along the fundus of the gallbladder with a suggestion of capsular thickening measuring 2.4 x 0.9 cm. In addition the gallbladder appears inseparable from the adjacent portion of the liver, the possibility of gallbladder carcinoma should be considered and excluded clinically. Correlation with recent ultrasound demonstrates poor visualization of the gallbladder. The findings  are not typical for acute cholecystitis in the CT images. Correlation with MR and MRCP is recommended. Pancreas: Unremarkable. No pancreatic ductal dilatation or surrounding inflammatory changes. Spleen: Normal in size without focal abnormality. Adrenals/Urinary Tract: Adrenal glands are unremarkable. Kidneys are normal, without renal calculi, focal lesion, or hydronephrosis. Bladder is unremarkable. Stomach/Bowel: There is mesenteric nodular thickening along the mid lower abdominal cavity better seen on images 56-60 and may suggest peritoneal carcinomatosis. Soft tissue nodular masses noted in the left lower quadrant region of the pelvis on image number 65 measuring 3.6 x 2.7 cm this could correlate with an ovarian neoplastic lesion. Again not present on prior examination 2024 Reproductive: Prior hysterectomy Other: No abdominal wall hernia or abnormality. No abdominopelvic ascites. Musculoskeletal: Extensive postsurgical changes of the thoracolumbar spine with intervertebral disc replacements L2-L3 L4 and L5. IMPRESSION: *Multiple pulmonary nodules in the lower lobes bilaterally suspicious for metastatic lung disease. *Irregular appearance of the gallbladder with a suggestion of capsular thickening along the fundus of the gallbladder. In addition the gallbladder appears inseparable from the adjacent portion of the liver, the possibility of gallbladder carcinoma should be considered and excluded clinically. Correlation with recent ultrasound demonstrates poor visualization of the gallbladder. The findings are not typical for acute cholecystitis in the CT images. Correlation with MR and MRCP is recommended. *Mesenteric nodular thickening along the mid lower abdominal cavity may suggest peritoneal carcinomatosis. *Soft tissue nodular mass in the left lower quadrant region of the pelvis measuring 3.6 x 2.7 cm this could correlate with an ovarian neoplastic lesion. Again not present on prior examination 2024.  *Extensive postsurgical changes of the thoracolumbar spine with intervertebral disc replacements L2-L3 L4 and L5. Electronically Signed   By: Fredrich Jefferson M.D.   On: 12/14/2023 15:18   US  Abdomen Limited RUQ (LIVER/GB) Result Date: 12/14/2023 CLINICAL DATA:  [id number] RUQ pain 151471 EXAM: ULTRASOUND ABDOMEN LIMITED RIGHT UPPER QUADRANT COMPARISON:  Nov 19, 2022 FINDINGS: Gallbladder: The gallbladder was not well visualized or evaluated. The sonographer reports right upper quadrant pain in this region during  the exam. Common bile duct: Diameter: 4 mm Liver: Normal echogenicity. No focal lesion identified. No intrahepatic biliary ductal dilation. Portal vein is patent on color Doppler imaging with normal direction of blood flow towards the liver. Other: None. IMPRESSION: 1. The gallbladder was not well visualized or evaluated during the exam; however, the sonographer reports right upper quadrant pain in this region. A follow-up CT of the abdomen and pelvis with IV contrast could be considered for further characterization. 2. No intrahepatic or extrahepatic biliary ductal dilation. Electronically Signed   By: Rance Burrows M.D.   On: 12/14/2023 13:05    Nutritional status  All images have been reviewed by me personally.    Assessment/Plan Principal Problem:   Abdominal pain Active Problems:   Unilateral primary osteoarthritis, left knee   Mixed hyperlipidemia   GERD (gastroesophageal reflux disease)   COPD with asthma (HCC)   Generalized anxiety disorder   DDD (degenerative disc disease), lumbar   Abdominal pain, nonspecific -Admit patient to the hospital.  Currently on clear liquid diet, IV fluids - CT abdomen pelvis shows concerns of multiple pulmonary nodule, gallbladder thickening concerning for carcinoma, mesenteric nodular thickening concerning for peritoneal carcinomatosis and nodular mass around her left ovary. - Will order MRCP, CA 19-9, CA125, CEA -Consulted Dr. Daisey Dryer from GYN  oncology who will see the patient tomorrow.  For now no need for transvaginal ultrasound - Consulted IR to help obtain biopsy from any of the above accessible site -Currently appears nontoxic, hold off on any IV antibiotics.  Low suspicion for acute cholecystitis but depending on MRCP result, we can consider HIDA scan.  LFTs and lipase are normal  COPD/asthma - Bronchodilators  Pulmonary nodules - Follows with outpatient [facility].  Osteoarthritis - Pain control  Hyperlipidemia - Statin  GERD - PPI  Generalized anxiety disorder - Continue home meds  CAD - Follows outpatient Dr. Berry [place].  Currently on aspirin  and statin  Essential hypertension - On Aldactone  which is currently on hold.  Continue Toprol -XL.  IV as needed  Chronic back pain - Follows outpatient neurosurgery   DVT prophylaxis: Lovenox Code Status: DNR Family Communication: Husband at bedside Consults called: Discussed with Dr. Daisey Dryer from GYN oncology.  EMR consult placed for IR for biopsy Admission status: Observation MedSurg  Status is: Observation The patient remains OBS appropriate and will d/c before 2 midnights.   Time Spent: 65 minutes.  >50% of the time was devoted to discussing the patients care, assessment, plan and disposition with other care givers along with counseling the patient about the risks and benefits of treatment.    Maggie Schooner MD [facility]  If 7PM-7AM, please contact night-coverage   12/14/2023, 4:02 PM

## 2023-12-14 NOTE — Telephone Encounter (Signed)
 Chief Complaint: abdominal pain Symptoms: pain, constipation, gas  Frequency: 2 wks Pertinent Negatives: Patient denies fever, N/V Disposition: [x] ED /[] Urgent Care (no appt availability in office) / [] Appointment(In office/virtual)/ []  [facility]/ [] Home Care/ [] Refused Recommended Disposition /[] [facility]/ []  Follow-up with PCP Additional Notes: abdominal pain for 2 wks and worsening. Reports 10/10 pain today, worse in the middle and on the R side, radiates to the R shoulder. Worse after eating spicy food. RN advised ED. Pt's husband will take her. RN advised call 911 for worsening. Pt verbalized understanding.    Copied from CRM [id number]. Topic: Clinical - Red Word Triage >> Dec 14, 2023  8:27 AM Zipporah Him wrote: Red Word that prompted transfer to Nurse Triage: Stomach is hurting, suspects its a gall bladder. Hurting about 2 weeks. Reason for Disposition  [1] SEVERE pain (e.g., excruciating) AND [2] present > 1 hour  Answer Assessment - Initial Assessment Questions 1. LOCATION: "Where does it hurt?"      "More in the middle and the side, goes up into my R shoulder" 2. RADIATION: "Does the pain shoot anywhere else?" (e.g., chest, back)     R shoulder 3. ONSET: "When did the pain begin?" (e.g., minutes, hours or days ago)      2 wks 4. SUDDEN: "Gradual or sudden onset?"     Hurting some before she ate spicy food, then got worse  5. PATTERN "Does the pain come and go, or is it constant?"    - If it comes and goes: "How long does it last?" "Do you have pain now?"     (Note: Comes and goes means the pain is intermittent. It goes away completely between bouts.)    - If constant: "Is it getting better, staying the same, or getting worse?"      (Note: Constant means the pain never goes away completely; most serious pain is constant and gets worse.)       6. SEVERITY: "How bad is the pain?"  (e.g., Scale 1-10; mild, moderate, or severe)    - MILD (1-3): Doesn't  interfere with normal activities, abdomen soft and not tender to touch.     - MODERATE (4-7): Interferes with normal activities or awakens from sleep, abdomen tender to touch.     - SEVERE (8-10): Excruciating pain, doubled over, unable to do any normal activities.       10/10 7. RECURRENT SYMPTOM: "Have you ever had this type of stomach pain before?" If Yes, ask: "When was the last time?" and "What happened that time?"      Denies 8. CAUSE: "What do you think is causing the stomach pain?"     Gallbladder  9. RELIEVING/AGGRAVATING FACTORS: "What makes it better or worse?" (e.g., antacids, bending or twisting motion, bowel movement)     Eating makes it worse  10. OTHER SYMPTOMS: "Do you have any other symptoms?" (e.g., back pain, diarrhea, fever, urination pain, vomiting)       Constipation, gas, took oxycodone , dramamine, aspirin . Tender to the touch, bloating. Daughter brought mushroom coffee, states she had a BM yesterday. Denies N/V. Saturday pt ate spicy food, made it worse. "I have chest pain some but it feels like trapped gas."  Protocols used: Abdominal Pain - [facility]

## 2023-12-14 NOTE — ED Provider Notes (Signed)
 [place] EMERGENCY DEPARTMENT AT [facility] Provider Note   CSN: [id number] Arrival date & time: 12/14/23  1008     History  Chief Complaint  Patient presents with   Abdominal Pain   Constipation    Janice Pace is a 78 y.o. female.  HPI    78 year old female comes in with chief complaint of abdominal pain and constipation. Patient indicates that she has been having abdominal pain in the upper quadrants for the last 2 weeks.  She has also been burping a lot.  She has history of tubal ligation, but no other abdominal surgery.  Her pain is fairly constant and it is worse at nighttime.  She denies any vomiting.  She has reduced appetite.  She is having bowel movements.  Family convinced her to come to the ER because patient is uncomfortable through the night.  Review of system is negative for any fevers.  Home Medications Prior to Admission medications   Medication Sig Start Date End Date Taking? Authorizing Provider  albuterol  (VENTOLIN  HFA) 108 (90 Base) MCG/ACT inhaler Inhale 1-2 puffs into the lungs every 6 (six) hours as needed for wheezing or shortness of breath. 05/06/22  Yes Cobb, Mariah Shines, NP  aspirin  81 MG EC tablet Take 81 mg by mouth daily.   Yes [provider]  budesonide  (PULMICORT ) 0.5 MG/2ML nebulizer solution Generic for Pulmicort . Inhale one vial in nebulizer twice a day. Rinse mouth after use. Patient taking differently: Take 0.5 mg by nebulization 2 (two) times daily. 01/19/23  Yes Wilfredo Hanly, MD  buPROPion  ER (WELLBUTRIN  SR) 100 MG 12 hr tablet Take 2 tablets (200 mg total) by mouth 2 (two) times daily. 09/25/23  Yes Maryclare Smoke A, PA  docusate sodium  (COLACE) 100 MG capsule Take 500 mg by mouth at bedtime.   Yes [provider]  formoterol  (PERFOROMIST ) 20 MCG/2ML nebulizer solution Substituted for: Perforomist  Solution Inhale one vial in nebulizer twice a day. Patient taking differently: Take 20 mcg by nebulization 2  (two) times daily. 01/19/23  Yes Wilfredo Hanly, MD  metoprolol  succinate (TOPROL -XL) 50 MG 24 hr tablet TAKE 1 TABLET BY MOUTH ONCE DAILY. TAKE WITH OR IMMEDIATELY FOLLOWING A MEAL Patient taking differently: Take 50 mg by mouth at bedtime. 11/11/23  Yes Laneta Pintos, MD  naltrexone  (DEPADE) 50 MG tablet Take 0.5 tablets (25 mg total) by mouth daily. Patient not taking: Reported on 12/17/2023 06/25/23  Yes Maryclare Smoke A, PA  NON FORMULARY Take 8 oz by mouth See admin instructions. Mushroom coffee: Drink 8-10 ounces by mouth once a day as needed to aid bowel movements   Yes [provider]  omeprazole  (PRILOSEC) 20 MG capsule Take 1 capsule (20 mg total) by mouth daily as needed (acid reflux). 06/25/23  Yes Maryclare Smoke A, PA  revefenacin  (YUPELRI ) 175 MCG/3ML nebulizer solution Take 3 mLs (175 mcg total) by nebulization daily. 02/04/23  Yes Wilfredo Hanly, MD  rosuvastatin  (CRESTOR ) 10 MG tablet Take 1 tablet (10 mg total) by mouth daily. 08/26/23  Yes Maryclare Smoke A, PA  spironolactone  (ALDACTONE ) 25 MG tablet TAKE 1 TABLET BY MOUTH ONCE DAILY IN THE MORNING Patient not taking: Reported on 12/17/2023 05/19/23  Yes Knox Perl, MD  chlordiazePOXIDE  (LIBRIUM ) 10 MG capsule Take 1 capsule (10 mg total) by mouth as needed for anxiety (max of twice daily). 12/16/23 01/15/24  Unk Garb, DO  ondansetron  (ZOFRAN ) 4 MG tablet Take 1 tablet (4 mg total) by mouth  every 6 (six) hours as needed for nausea. 12/16/23   Unk Garb, DO  oxyCODONE  10 MG TABS Take 1-1.5 tablets (10-15 mg total) by mouth every 4 (four) hours as needed for moderate pain (pain score 4-6) or severe pain (pain score 7-10). 12/16/23   Unk Garb, DO  pantoprazole  (PROTONIX ) 40 MG tablet Take 1 tablet (40 mg total) by mouth daily before breakfast. 12/17/23 01/16/24  Unk Garb, DO      Allergies    Sulfa antibiotics, Forteo [parathyroid hormone (recomb)], Atorvastatin, and Metformin hcl    Review of Systems   Review of Systems   All other systems reviewed and are negative.   Physical Exam Updated Vital Signs BP 111/61 (BP Location: Left Arm)   Pulse 66   Temp 97.8 F (36.6 C)   Resp 16   Wt 77.1 kg   SpO2 97%   BMI 31.09 kg/m  Physical Exam Vitals and nursing note reviewed.  Constitutional:      Appearance: She is well-developed.  HENT:     Head: Normocephalic and atraumatic.  Eyes:     Extraocular Movements: Extraocular movements intact.  Cardiovascular:     Rate and Rhythm: Normal rate.  Pulmonary:     Effort: Pulmonary effort is normal.  Abdominal:     Tenderness: There is abdominal tenderness in the right upper quadrant and epigastric area. There is guarding and rebound. Positive signs include Murphy's sign.  Musculoskeletal:     Cervical back: Normal range of motion and neck supple.  Skin:    General: Skin is dry.  Neurological:     Mental Status: She is alert and oriented to person, place, and time.     ED Results / Procedures / Treatments   Labs (all labs ordered are listed, but only abnormal results are displayed) Labs Reviewed  COMPREHENSIVE METABOLIC PANEL WITH GFR - Abnormal; Notable for the following components:      Result Value   Glucose, Bld 119 (*)    BUN 7 (*)    Total Protein 6.3 (*)    All other components within normal limits  URINALYSIS, ROUTINE W REFLEX MICROSCOPIC - Abnormal; Notable for the following components:   APPearance HAZY (*)    All other components within normal limits  CANCER ANTIGEN 19-9 - Abnormal; Notable for the following components:   CA 19-9 14,368 (*)    All other components within normal limits  CEA - Abnormal; Notable for the following components:   CEA 98.7 (*)    All other components within normal limits  CA 125 - Abnormal; Notable for the following components:   Cancer Antigen (CA) 125 91.3 (*)    All other components within normal limits  BASIC METABOLIC PANEL WITH GFR - Abnormal; Notable for the following components:   Potassium 3.3  (*)    Glucose, Bld 135 (*)    BUN <5 (*)    Calcium  8.5 (*)    All other components within normal limits  CBC - Abnormal; Notable for the following components:   Hemoglobin 11.9 (*)    All other components within normal limits  BASIC METABOLIC PANEL WITH GFR - Abnormal; Notable for the following components:   Glucose, Bld 112 (*)    BUN 6 (*)    All other components within normal limits  LIPASE, BLOOD  CBC  CBC  CREATININE, SERUM  PROTIME-INR  MAGNESIUM   CBC  MAGNESIUM   SURGICAL PATHOLOGY    EKG EKG Interpretation Date/Time:  Monday December 14 2023 10:17:36 EDT Ventricular Rate:  70 PR Interval:  146 QRS Duration:  77 QT Interval:  404 QTC Calculation: 436 R Axis:   71  Text Interpretation: Sinus rhythm Low voltage, precordial leads No acute changes No significant change since last tracing Confirmed by Deatra Face [phone]) on 12/14/2023 3:09:57 PM  Radiology No results found.   Procedures Procedures    Medications Ordered in ED Medications  dextrose  5 % and 0.45 % NaCl infusion (0 mLs Intravenous Stopped 12/15/23 2013)  iohexol  (OMNIPAQUE ) 300 MG/ML solution 100 mL (100 mLs Intravenous Contrast Given 12/14/23 1338)  fentaNYL  (SUBLIMAZE ) injection 50 mcg (50 mcg Intravenous Given 12/14/23 1604)  gadobutrol  (GADAVIST ) 1 MMOL/ML injection 7 mL (7 mLs Intravenous Contrast Given 12/14/23 1707)  potassium chloride  SA (KLOR-CON  M) CR tablet 40 mEq (40 mEq Oral Given 12/15/23 1223)  midazolam  (VERSED ) injection (1 mg Intravenous Given 12/15/23 1111)  fentaNYL  (SUBLIMAZE ) injection (50 mcg Intravenous Given 12/15/23 1111)    ED Course/ Medical Decision Making/ A&P                                 Medical Decision Making Amount and/or Complexity of Data Reviewed Labs: ordered. Radiology: ordered.  Risk Prescription drug management. Decision regarding hospitalization.   This patient presents to the ED with chief complaint(s) of abdominal pain with pertinent past medical  history of constipation, no abdominal surgical history besides tubal ligation and.The complaint involves an extensive differential diagnosis and also carries with it a high risk of complications and morbidity.    The differential diagnosis includes : Pancreatitis, Hepatobiliary pathology including cholelithiasis and cholecystitis, Gastritis/peptic ulcer disease, small bowel obstruction, tumor  The initial plan is to get ultrasound right upper quadrant and basic labs.   Additional history obtained: Additional history obtained from family  Independent labs interpretation:  The following labs were independently interpreted: Patient CBC is normal.  CMP and lipase is normal.  Ultrasound of the abdomen is equivocal.  CT abdomen pelvis ordered.  I have independently interpreted CT scan, no evidence of perforation.  ? Tumor.  Will admit  Final Clinical Impression(s) / ED Diagnoses Final diagnoses:  Carcinomatosis peritonei (HCC)  Generalized anxiety disorder    Rx / DC Orders ED Discharge Orders          Ordered    pantoprazole  (PROTONIX ) 40 MG tablet  Daily before breakfast        12/16/23 1402    Amb Referral to Palliative Care        12/16/23 0958    oxyCODONE  10 MG TABS  Every 4 hours PRN        12/16/23 1402    chlordiazePOXIDE  (LIBRIUM ) 10 MG capsule  As needed        12/16/23 1402    ondansetron  (ZOFRAN ) 4 MG tablet  Every 6 hours PRN        12/16/23 1402    Increase activity slowly        12/16/23 1402    Diet - low sodium heart healthy        12/16/23 1402    Discharge instructions       Comments: 1. Follow up with your primary care provider in 1-2 weeks following discharge from hospital. 2. Oncology office will call you with appointment once your biopsy results are back.   12/16/23 1402    No wound care  12/16/23 1402    Call MD for:  temperature >100.4        12/16/23 1402    Call MD for:  persistant nausea and vomiting        12/16/23 1402    Call MD  for:  severe uncontrolled pain        12/16/23 1402    Call MD for:  redness, tenderness, or signs of infection (pain, swelling, redness, odor or green/yellow discharge around incision site)        12/16/23 1402    Call MD for:  difficulty breathing, headache or visual disturbances        12/16/23 1402    Call MD for:  hives        12/16/23 1402    Call MD for:  persistant dizziness or light-headedness        12/16/23 1402    Call MD for:  extreme fatigue        12/16/23 1402              Deatra Face, MD 12/21/23 2051

## 2023-12-14 NOTE — ED Triage Notes (Signed)
 PT arrives via POV. Pt reports she has been experiencing upper abdominal pain and constipation for the past two weeks. PT states the abdominal pain does occasionally radiate up to her chest, describes it as trapped gas. Pt AxOx4.

## 2023-12-15 ENCOUNTER — Other Ambulatory Visit

## 2023-12-15 ENCOUNTER — Observation Stay (HOSPITAL_COMMUNITY)

## 2023-12-15 DIAGNOSIS — Z888 Allergy status to other drugs, medicaments and biological substances status: Secondary | ICD-10-CM | POA: Diagnosis not present

## 2023-12-15 DIAGNOSIS — E782 Mixed hyperlipidemia: Secondary | ICD-10-CM | POA: Diagnosis present

## 2023-12-15 DIAGNOSIS — Z6831 Body mass index (BMI) 31.0-31.9, adult: Secondary | ICD-10-CM | POA: Diagnosis not present

## 2023-12-15 DIAGNOSIS — E559 Vitamin D deficiency, unspecified: Secondary | ICD-10-CM

## 2023-12-15 DIAGNOSIS — Z515 Encounter for palliative care: Secondary | ICD-10-CM

## 2023-12-15 DIAGNOSIS — Z7983 Long term (current) use of bisphosphonates: Secondary | ICD-10-CM | POA: Diagnosis not present

## 2023-12-15 DIAGNOSIS — R1011 Right upper quadrant pain: Secondary | ICD-10-CM

## 2023-12-15 DIAGNOSIS — F32A Depression, unspecified: Secondary | ICD-10-CM | POA: Diagnosis present

## 2023-12-15 DIAGNOSIS — Z7189 Other specified counseling: Secondary | ICD-10-CM | POA: Diagnosis not present

## 2023-12-15 DIAGNOSIS — R7303 Prediabetes: Secondary | ICD-10-CM

## 2023-12-15 DIAGNOSIS — R1084 Generalized abdominal pain: Secondary | ICD-10-CM | POA: Diagnosis not present

## 2023-12-15 DIAGNOSIS — J4489 Other specified chronic obstructive pulmonary disease: Secondary | ICD-10-CM | POA: Diagnosis present

## 2023-12-15 DIAGNOSIS — I2510 Atherosclerotic heart disease of native coronary artery without angina pectoris: Secondary | ICD-10-CM | POA: Diagnosis present

## 2023-12-15 DIAGNOSIS — K219 Gastro-esophageal reflux disease without esophagitis: Secondary | ICD-10-CM | POA: Diagnosis present

## 2023-12-15 DIAGNOSIS — K828 Other specified diseases of gallbladder: Secondary | ICD-10-CM | POA: Diagnosis not present

## 2023-12-15 DIAGNOSIS — C786 Secondary malignant neoplasm of retroperitoneum and peritoneum: Secondary | ICD-10-CM | POA: Diagnosis present

## 2023-12-15 DIAGNOSIS — Z66 Do not resuscitate: Secondary | ICD-10-CM | POA: Diagnosis present

## 2023-12-15 DIAGNOSIS — E876 Hypokalemia: Secondary | ICD-10-CM | POA: Diagnosis present

## 2023-12-15 DIAGNOSIS — Z87891 Personal history of nicotine dependence: Secondary | ICD-10-CM | POA: Diagnosis not present

## 2023-12-15 DIAGNOSIS — C23 Malignant neoplasm of gallbladder: Secondary | ICD-10-CM | POA: Diagnosis present

## 2023-12-15 DIAGNOSIS — M51369 Other intervertebral disc degeneration, lumbar region without mention of lumbar back pain or lower extremity pain: Secondary | ICD-10-CM | POA: Diagnosis present

## 2023-12-15 DIAGNOSIS — Z882 Allergy status to sulfonamides status: Secondary | ICD-10-CM | POA: Diagnosis not present

## 2023-12-15 DIAGNOSIS — I700 Atherosclerosis of aorta: Secondary | ICD-10-CM | POA: Diagnosis present

## 2023-12-15 DIAGNOSIS — E66811 Obesity, class 1: Secondary | ICD-10-CM | POA: Diagnosis present

## 2023-12-15 DIAGNOSIS — D630 Anemia in neoplastic disease: Secondary | ICD-10-CM | POA: Diagnosis present

## 2023-12-15 DIAGNOSIS — M549 Dorsalgia, unspecified: Secondary | ICD-10-CM | POA: Diagnosis present

## 2023-12-15 DIAGNOSIS — F411 Generalized anxiety disorder: Secondary | ICD-10-CM | POA: Diagnosis present

## 2023-12-15 DIAGNOSIS — Z7982 Long term (current) use of aspirin: Secondary | ICD-10-CM | POA: Diagnosis not present

## 2023-12-15 DIAGNOSIS — I10 Essential (primary) hypertension: Secondary | ICD-10-CM

## 2023-12-15 DIAGNOSIS — Z1159 Encounter for screening for other viral diseases: Secondary | ICD-10-CM

## 2023-12-15 DIAGNOSIS — G8929 Other chronic pain: Secondary | ICD-10-CM | POA: Diagnosis present

## 2023-12-15 LAB — BASIC METABOLIC PANEL WITH GFR
Anion gap: 5 (ref 5–15)
BUN: <5 mg/dL — ABNORMAL LOW (ref 8–23)
CO2: 25 mmol/L (ref 22–32)
Calcium: 8.5 mg/dL — ABNORMAL LOW (ref 8.9–10.3)
Chloride: 106 mmol/L (ref 98–111)
Creatinine, Ser: 0.8 mg/dL (ref 0.44–1.00)
GFR, Estimated: >60 mL/min (ref >60)
Glucose, Bld: 135 mg/dL — ABNORMAL HIGH (ref 70–99)
Potassium: 3.3 mmol/L — ABNORMAL LOW (ref 3.5–5.1)
Sodium: 136 mmol/L (ref 135–145)

## 2023-12-15 LAB — CBC
HCT: 36.4 % (ref 36.0–46.0)
Hemoglobin: 11.9 g/dL — ABNORMAL LOW (ref 12.0–15.0)
MCH: 29.8 pg (ref 26.0–34.0)
MCHC: 32.7 g/dL (ref 30.0–36.0)
MCV: 91 fL (ref 80.0–100.0)
Platelets: 187 10*3/uL (ref 150–400)
RBC: 4 MIL/uL (ref 3.87–5.11)
RDW: 13 % (ref 11.5–15.5)
WBC: 5.2 10*3/uL (ref 4.0–10.5)
nRBC: 0 % (ref 0.0–0.2)

## 2023-12-15 LAB — MAGNESIUM: Magnesium: 2 mg/dL (ref 1.7–2.4)

## 2023-12-15 MED ORDER — MIDAZOLAM HCL 2 MG/2ML IJ SOLN
INTRAMUSCULAR | Status: AC | PRN
  Administered 2023-12-15: 1 mg via INTRAVENOUS

## 2023-12-15 MED ORDER — ENOXAPARIN SODIUM 40 MG/0.4ML IJ SOSY
40.0000 mg | PREFILLED_SYRINGE | Freq: Every day | INTRAMUSCULAR | Status: DC
  Administered 2023-12-16: 40 mg via SUBCUTANEOUS
  Filled 2023-12-15: qty 0.4

## 2023-12-15 MED ORDER — HYDROMORPHONE HCL 1 MG/ML IJ SOLN: 0.5000 mg | INTRAMUSCULAR | Status: DC | PRN

## 2023-12-15 MED ORDER — FENTANYL CITRATE (PF) 100 MCG/2ML IJ SOLN
INTRAMUSCULAR | Status: AC | PRN
  Administered 2023-12-15: 50 ug via INTRAVENOUS

## 2023-12-15 MED ORDER — FENTANYL CITRATE (PF) 100 MCG/2ML IJ SOLN
INTRAMUSCULAR | Status: AC
  Filled 2023-12-15: qty 2

## 2023-12-15 MED ORDER — POTASSIUM CHLORIDE CRYS ER 20 MEQ PO TBCR
40.0000 meq | EXTENDED_RELEASE_TABLET | ORAL | Status: AC
  Administered 2023-12-15 (×2): 40 meq via ORAL
  Filled 2023-12-15 (×3): qty 2

## 2023-12-15 MED ORDER — OXYCODONE HCL 5 MG PO TABS
10.0000 mg | ORAL_TABLET | ORAL | Status: DC | PRN
  Administered 2023-12-15 – 2023-12-16 (×2): 10 mg via ORAL
  Filled 2023-12-15 (×2): qty 2

## 2023-12-15 MED ORDER — MIDAZOLAM HCL 2 MG/2ML IJ SOLN
INTRAMUSCULAR | Status: AC
  Filled 2023-12-15: qty 2

## 2023-12-15 MED ORDER — SENNOSIDES-DOCUSATE SODIUM 8.6-50 MG PO TABS
2.0000 | ORAL_TABLET | Freq: Every day | ORAL | Status: DC
  Administered 2023-12-15: 2 via ORAL
  Filled 2023-12-15: qty 2

## 2023-12-15 NOTE — Consult Note (Cosign Needed)
 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 Lilyan Remedies  HISTORY OF PRESENTING ILLNESS:  Janice Pace 78 y.o. female who was admitted on 12/14/2023 with complaints of aggressively worsening abdominal pain.  Patient reports that pain started approximately 2 weeks ago and radiates to her right shoulder.  Also complains of constipation with gas which "feels trapped".  Also admits to acid reflux.  Denies nausea and vomiting.   Workup done in the ED includes CT scan of abdomen pelvis which showed gallbladder mass with pulmonary nodules and peritoneal carcinomatosis.  Findings concerning for metastatic disease and therefore oncology consult has been requested. Patient is seen, assessed and examined today.  She had just returned from biopsy.  Spouse at bedside.  She is a good historian and details chief complaints as documented above. Medical history includes COPD, hyperlipidemia for which she is on statins at home, and irregular heart rate per patient. Surgical history includes 4 back surgeries status post trauma for which she states she has multiple rods and screws. Family oncologic history includes mother with leukemia and several aunts and uncles with various cancers. Social history significant for 50-year tobacco use history starting at age 60 and quitting at age 36, 1 pack/day x 50 years.  Admits to being social alcohol drinker.  Denies recreational or illicit drug use.  Denies occupational hazardous exposure, worked previously Warden/ranger trailers.     I have reviewed her chart and materials related to her cancer extensively and collaborated history with the patient. Summary of oncologic  history is as follows: Oncology History   No history exists.    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 -- CT abdomen and pelvis and MR abdomen both done 6/2 show multiple pulmonary nodes in the lower lobes suspicious for mets.  Gallbladder appears inseparable from liver, suspicious for gallbladder carcinoma.  -- Status post ct guided abd mass biopsy today, will follow results of path. - Tumor markers pending - Medical oncology/Dr. Arno Bibles following and will make further evaluation recommendations.  Anemia -Mild - Hemoglobin 11.9 today.  Baseline hemoglobin appears to be in the 13-14 range. - No transfusional intervention required at this time. - Continue to monitor CBC with differential  Constipation GERD - Likely secondary to malignancy - Continue laxatives with Senokot as ordered - Continue PPI with Protonix  as ordered - Continue supportive care  COPD Hyperlipidemia -- stable, monitor closely -- On statin   MEDICAL HISTORY:  Past Medical History:  Diagnosis Date   Arthritis    Asthma    Bronchitis    Cancer (HCC)    dermatosivros arcoma and protuverans   Chronic back pain    degenerative disc disease   Constipation    stool softener daily   Constipation    COPD (chronic obstructive pulmonary disease) (HCC)    GERD (gastroesophageal reflux disease)    takes Prilosec daily   GERD (gastroesophageal reflux disease)    Heart murmur    Heart valve disorder    Hemorrhoids    History of hiatal hernia    Insomnia    d/t chantix    Joint  pain    Nocturia    Personal history of colonic adenoma 03/13/2003   03/13/2003 - 5 mm adenoma   Pre-diabetes    Prediabetes    White coat syndrome without diagnosis of hypertension     SURGICAL HISTORY: Past Surgical History:  Procedure Laterality Date    ABDOMINAL HYSTERECTOMY     ANTERIOR LAT LUMBAR FUSION N/A 02/27/2021   Procedure: Anterior lateral interbody fusion - lateral two - lateral three - lateral three - lateral four with exploration fusion L4-S1;  Surgeon: Gearl Keens, MD;  Location: [facility] OR;  Service: Neurosurgery;  Laterality: N/A;   BACK SURGERY  1988   BACK SURGERY  09/03/2011   rods,screws x 8    BACK SURGERY     09/2022   BUNIONECTOMY     bilateral   BUNIONECTOMY     bil feet   COLONOSCOPY     dermatosibrosarcoma protuberans     LAMINECTOMY WITH POSTERIOR LATERAL ARTHRODESIS LEVEL 3 N/A 02/27/2021   Procedure: Posterior augmentation with pedicle screws Latreal one - lateral four - Posterior Lateral and Interbody fusion;  Surgeon: Gearl Keens, MD;  Location: [facility] OR;  Service: Neurosurgery;  Laterality: N/A;   PARTIAL HYSTERECTOMY     RECTOCELE REPAIR     Rectum Repair     RESECTION TUMOR WRIST RADICAL     x 2 rt   TUBAL LIGATION  1974   TUBAL LIGATION     WRIST SURGERY     left    SOCIAL HISTORY: Social History   Socioeconomic History   Marital status: Married    Spouse name: david   Number of children: 3   Years of education: Not on file   Highest education level: GED or equivalent  Occupational History   Occupation: retired  Tobacco Use   Smoking status: Former    Current packs/day: 0.00    Average packs/day: 1 pack/day for 50.0 years (50.0 ttl pk-yrs)    Types: Cigarettes    Start date: 08/16/1961    Quit date: 08/17/2011    Years since quitting: 12.3    Passive exposure: Never   Smokeless tobacco: Never  Vaping Use   Vaping status: Never Used  Substance and Sexual Activity   Alcohol use: No    Comment: occasional wine   Drug use: No   Sexual activity: Yes    Birth control/protection: Surgical, Other-see comments  Other Topics Concern   Not on file  Social History Narrative   ** Merged History Encounter **       Social Drivers of Health   Financial Resource Strain: Low Risk  (08/25/2023)    Overall Financial Resource Strain (CARDIA)    Difficulty of Paying Living Expenses: Not hard at all  Food Insecurity: No Food Insecurity (12/14/2023)   Hunger Vital Sign    Worried About Running Out of Food in the Last Year: Never true    Ran Out of Food in the Last Year: Never true  Transportation Needs: No Transportation Needs (12/14/2023)   PRAPARE - Administrator, Civil Service (Medical): No    Lack of Transportation (Non-Medical): No  Physical Activity: Inactive (08/25/2023)   Exercise Vital Sign    Days of Exercise per Week: 0 days    Minutes of Exercise per Session: 0 min  Stress: No Stress Concern Present (08/25/2023)   [employer] of Occupational Health - Occupational Stress Questionnaire    Feeling of Stress : Not at all  Social Connections:  Socially Integrated (12/14/2023)   Social Connection and Isolation Panel [NHANES]    Frequency of Communication with Friends and Family: More than three times a week    Frequency of Social Gatherings with Friends and Family: Once a week    Attends Religious Services: More than 4 times per year    Active Member of [employer] or Organizations: Yes    Attends Engineer, structural: More than 4 times per year    Marital Status: Married  Catering manager Violence: Not At Risk (12/14/2023)   Humiliation, Afraid, Rape, and Kick questionnaire    Fear of Current or Ex-Partner: No    Emotionally Abused: No    Physically Abused: No    Sexually Abused: No    FAMILY HISTORY: Family History  Problem Relation Age of Onset   Leukemia Mother    Anesthesia problems Neg Hx    Hypotension Neg Hx    Malignant hyperthermia Neg Hx    Pseudochol deficiency Neg Hx    Colon cancer Neg Hx    Colon polyps Neg Hx    Esophageal cancer Neg Hx    Stomach cancer Neg Hx    Rectal cancer Neg Hx      PHYSICAL EXAMINATION: ECOG PERFORMANCE STATUS: 2 - Symptomatic, <50% confined to bed  Vitals:   12/15/23 0615 12/15/23 0806  BP:    Pulse:     Resp:    Temp:    SpO2: 100% 96%   Filed Weights   12/15/23 0450  Weight: 169 lb 15.6 oz (77.1 kg)    GENERAL: alert, no distress and comfortable SKIN: skin color, texture, turgor are normal, no rashes or significant lesions EYES: normal, conjunctiva are pink and non-injected, sclera clear OROPHARYNX: no exudate, no erythema and lips, buccal mucosa, and tongue normal  NECK: supple, thyroid  normal size, non-tender, without nodularity LYMPH: no palpable lymphadenopathy in the cervical, axillary or inguinal LUNGS: clear to auscultation and percussion with normal breathing effort HEART: regular rate & rhythm and no murmurs and no lower extremity edema ABDOMEN: abdomen soft, non-tender and normal bowel sounds MUSCULOSKELETAL: no cyanosis of digits and no clubbing  PSYCH: alert & oriented x 3 with fluent speech NEURO: no focal motor/sensory deficits   ALLERGIES:  is allergic to sulfa antibiotics, forteo [parathyroid hormone (recomb)], atorvastatin, and metformin hcl.  MEDICATIONS:  Current Facility-Administered Medications  Medication Dose Route Frequency Provider Last Rate Last Admin   acetaminophen  (TYLENOL ) tablet 650 mg  650 mg Oral Q6H PRN Amin, Ankit C, MD       Or   acetaminophen  (TYLENOL ) suppository 650 mg  650 mg Rectal Q6H PRN Amin, Ankit C, MD       arformoterol  (BROVANA ) nebulizer solution 15 mcg  15 mcg Nebulization BID Amin, Ankit C, MD   15 mcg at 12/15/23 0615   buPROPion  ER (WELLBUTRIN  SR) 12 hr tablet 200 mg  200 mg Oral BID Amin, Ankit C, MD   200 mg at 12/15/23 0904   dextrose  5 % and 0.45 % NaCl infusion   Intravenous Continuous Amin, Ankit C, MD 75 mL/hr at 12/14/23 1824 New Bag at 12/14/23 1824   enoxaparin (LOVENOX) injection 40 mg  40 mg Subcutaneous Q24H Amin, Ankit C, MD       guaiFENesin (ROBITUSSIN) 100 MG/5ML liquid 5 mL  5 mL Oral Q4H PRN Amin, Ankit C, MD       hydrALAZINE (APRESOLINE) injection 10 mg  10 mg Intravenous Q4H PRN Amin, Ankit C, MD  HYDROmorphone  (DILAUDID ) injection 0.5-1 mg  0.5-1 mg Intravenous Q2H PRN Amin, Ankit C, MD   1 mg at 12/15/23 1610   ipratropium-albuterol  (DUONEB) 0.5-2.5 (3) MG/3ML nebulizer solution 3 mL  3 mL Nebulization Q4H PRN Amin, Ankit C, MD       metoprolol  succinate (TOPROL -XL) 24 hr tablet 50 mg  50 mg Oral Daily Amin, Ankit C, MD   50 mg at 12/15/23 0901   metoprolol  tartrate (LOPRESSOR ) injection 5 mg  5 mg Intravenous Q4H PRN Amin, Ankit C, MD       nicotine (NICODERM CQ - dosed in mg/24 hours) patch 21 mg  21 mg Transdermal Daily PRN Amin, Ankit C, MD       ondansetron  (ZOFRAN ) tablet 4 mg  4 mg Oral Q6H PRN Amin, Ankit C, MD       Or   ondansetron  (ZOFRAN ) injection 4 mg  4 mg Intravenous Q6H PRN Amin, Ankit C, MD   4 mg at 12/14/23 2125   oxyCODONE  (Oxy IR/ROXICODONE ) immediate release tablet 5 mg  5 mg Oral Q4H PRN Amin, Ankit C, MD   5 mg at 12/14/23 1917   pantoprazole  (PROTONIX ) EC tablet 40 mg  40 mg Oral QAC breakfast Amin, Ankit C, MD   40 mg at 12/15/23 0900   potassium chloride  SA (KLOR-CON  M) CR tablet 40 mEq  40 mEq Oral Q4H Modena Andes, MD   40 mEq at 12/15/23 0902   revefenacin  (YUPELRI ) nebulizer solution 175 mcg  175 mcg Nebulization Daily Amin, Ankit C, MD   175 mcg at 12/15/23 9604   rosuvastatin  (CRESTOR ) tablet 10 mg  10 mg Oral Daily Amin, Ankit C, MD   10 mg at 12/15/23 0901   senna-docusate (Senokot-S) tablet 1 tablet  1 tablet Oral QHS PRN Amin, Ankit C, MD       traZODone (DESYREL) tablet 50 mg  50 mg Oral QHS PRN Amin, Ankit C, MD         LABORATORY DATA:  I have reviewed the data as listed Lab Results  Component Value Date   WBC 5.2 12/15/2023   HGB 11.9 (L) 12/15/2023   HCT 36.4 12/15/2023   MCV 91.0 12/15/2023   PLT 187 12/15/2023   Recent Labs    02/11/23 0845 03/13/23 0916 05/19/23 0909 12/14/23 1044 12/14/23 1825 12/15/23 0424  NA 144   < > 144 139  --  136  K 4.8   < > 4.5 4.1  --  3.3*  CL 106   < > 107* 108  --  106  CO2 23   < > 23 23   --  25  GLUCOSE 107*   < > 102* 119*  --  135*  BUN 9   < > 7* 7*  --  <5*  CREATININE 0.79   < > 0.82 0.66 0.63 0.80  CALCIUM  9.3   < > 9.4 9.0  --  8.5*  GFRNONAA  --   --   --  >60 >60 >60  PROT 6.4  --   --  6.3*  --   --   ALBUMIN  4.1  --   --  3.5  --   --   AST 49*  --   --  19  --   --   ALT 47*  --   --  16  --   --   ALKPHOS 108  --   --  68  --   --  BILITOT 0.5  --   --  0.8  --   --    < > = values in this interval not displayed.    RADIOGRAPHIC STUDIES: I have personally reviewed the radiological images as listed and agreed with the findings in the report. MR ABDOMEN MRCP W WO CONTAST Result Date: 12/14/2023 CLINICAL DATA:  Gallbladder malignancy suspected, gallbladder mass, pulmonary metastases, and peritoneal metastatic disease identified by prior CT EXAM: MRI ABDOMEN WITHOUT AND WITH CONTRAST (INCLUDING MRCP) TECHNIQUE: Multiplanar multisequence MR imaging of the abdomen was performed both before and after the administration of intravenous contrast. Heavily T2-weighted images of the biliary and pancreatic ducts were obtained, and three-dimensional MRCP images were rendered by post processing. CONTRAST:  7mL GADAVIST GADOBUTROL 1 MMOL/ML IV SOLN COMPARISON:  CT abdomen pelvis, 12/14/2023 FINDINGS: Lower chest: No acute abnormality. Numerous small bilateral pulmonary nodules, better assessed by CT (series 14, image 5). Hepatobiliary: Thickened, contracted gallbladder with a heterogeneous mass arising from the gallbladder fundus, contiguous with the adjacent liver parenchyma of hepatic segment IVB measuring 3.5 x 2.8 cm (series 14, image 37). Multiple small rim enhancing lesions within the liver parenchyma, for example in anterior hepatic segment II measuring 1.2 x 1.1 cm (series 14, image 19). Pancreas: Unremarkable. No pancreatic ductal dilatation or surrounding inflammatory changes. Spleen: Normal in size without significant abnormality. Adrenals/Urinary Tract: Unchanged,  definitively benign macroscopic fat containing adrenal adenomata requiring no specific further follow-up or characterization. Kidneys are normal, without obvious renal calculi, solid lesion, or hydronephrosis. Stomach/Bowel: Stomach is within normal limits. No evidence of bowel wall thickening, distention, or inflammatory changes. Vascular/Lymphatic: Aortic atherosclerosis. Enlarged, necrotic appearing portacaval and porta hepatis lymph nodes measuring up 2 2.9 x 1.6 cm (series 14, image 31). Small, although rim enhancing and abnormal appearing retroperitoneal lymph nodes (series 14, image 50). Other: No abdominal wall hernia or abnormality. No ascites. Peritoneal stranding and nodularity, incompletely visualized on this examination although present in the left upper quadrant (series 14, image 29). Musculoskeletal: No acute or significant osseous findings. IMPRESSION: 1. Thickened, contracted gallbladder with a heterogeneous mass arising from the gallbladder fundus, contiguous with the adjacent liver parenchyma of hepatic segment IVB measuring 3.5 x 2.8 cm. 2. Multiple small rim enhancing lesions within the liver parenchyma. 3. Enlarged, necrotic appearing portacaval and porta hepatis lymph nodes. Small, although rim enhancing and abnormal appearing retroperitoneal lymph nodes. 4. Peritoneal stranding and nodularity, incompletely visualized on this examination although present in the left upper quadrant, consistent with peritoneal metastatic disease. 5. Numerous small bilateral pulmonary nodules, better assessed by CT. 6. 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 Aortic Atherosclerosis (ICD10-I70.0). Electronically Signed   By: Fredricka Jenny M.D.   On: 12/14/2023 21:08   MR 3D Recon At Scanner Result Date: 12/14/2023 CLINICAL DATA:  Gallbladder malignancy suspected, gallbladder mass, pulmonary metastases, and peritoneal metastatic disease identified by  prior CT EXAM: MRI ABDOMEN WITHOUT AND WITH CONTRAST (INCLUDING MRCP) TECHNIQUE: Multiplanar multisequence MR imaging of the abdomen was performed both before and after the administration of intravenous contrast. Heavily T2-weighted images of the biliary and pancreatic ducts were obtained, and three-dimensional MRCP images were rendered by post processing. CONTRAST:  7mL GADAVIST GADOBUTROL 1 MMOL/ML IV SOLN COMPARISON:  CT abdomen pelvis, 12/14/2023 FINDINGS: Lower chest: No acute abnormality. Numerous small bilateral pulmonary nodules, better assessed by CT (series 14, image 5). Hepatobiliary: Thickened, contracted gallbladder with a heterogeneous mass arising from the gallbladder fundus, contiguous with the adjacent liver parenchyma  of hepatic segment IVB measuring 3.5 x 2.8 cm (series 14, image 37). Multiple small rim enhancing lesions within the liver parenchyma, for example in anterior hepatic segment II measuring 1.2 x 1.1 cm (series 14, image 19). Pancreas: Unremarkable. No pancreatic ductal dilatation or surrounding inflammatory changes. Spleen: Normal in size without significant abnormality. Adrenals/Urinary Tract: Unchanged, definitively benign macroscopic fat containing adrenal adenomata requiring no specific further follow-up or characterization. Kidneys are normal, without obvious renal calculi, solid lesion, or hydronephrosis. Stomach/Bowel: Stomach is within normal limits. No evidence of bowel wall thickening, distention, or inflammatory changes. Vascular/Lymphatic: Aortic atherosclerosis. Enlarged, necrotic appearing portacaval and porta hepatis lymph nodes measuring up 2 2.9 x 1.6 cm (series 14, image 31). Small, although rim enhancing and abnormal appearing retroperitoneal lymph nodes (series 14, image 50). Other: No abdominal wall hernia or abnormality. No ascites. Peritoneal stranding and nodularity, incompletely visualized on this examination although present in the left upper quadrant (series  14, image 29). Musculoskeletal: No acute or significant osseous findings. IMPRESSION: 1. Thickened, contracted gallbladder with a heterogeneous mass arising from the gallbladder fundus, contiguous with the adjacent liver parenchyma of hepatic segment IVB measuring 3.5 x 2.8 cm. 2. Multiple small rim enhancing lesions within the liver parenchyma. 3. Enlarged, necrotic appearing portacaval and porta hepatis lymph nodes. Small, although rim enhancing and abnormal appearing retroperitoneal lymph nodes. 4. Peritoneal stranding and nodularity, incompletely visualized on this examination although present in the left upper quadrant, consistent with peritoneal metastatic disease. 5. Numerous small bilateral pulmonary nodules, better assessed by CT. 6. 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 Aortic Atherosclerosis (ICD10-I70.0). Electronically Signed   By: Fredricka Jenny M.D.   On: 12/14/2023 21:08   CT ABDOMEN PELVIS W CONTRAST Result Date: 12/14/2023 CLINICAL DATA:  Right upper quadrant abdominal pain EXAM: CT ABDOMEN AND PELVIS WITH CONTRAST TECHNIQUE: Multidetector CT imaging of the abdomen and pelvis was performed using the standard protocol following bolus administration of intravenous contrast. RADIATION DOSE REDUCTION: This exam was performed according to the departmental dose-optimization program which includes automated exposure control, adjustment of the mA and/or kV according to patient size and/or use of iterative reconstruction technique. CONTRAST:  OMNIPAQUE  IOHEXOL  300 MG/ML  SOLN COMPARISON:  Nov 19, 2022 FINDINGS: Lower chest: Several nodules in both lower lobes are identified, measuring between 3 and 4 mm in the right lower lobe and 3 and 5 mm in the lingula, as well as multiple nodules in the left lower lobe posteromedial segment the largest 9 mm in diameter on image number 8. And in the right lower lobe image number 25 measuring 12 mm in  diameter. These nodules were not identified on the prior CT from 2024 there are 4 suspicious for metastatic lung disease. Hepatobiliary: Liver appears normal in size no parenchymal lesions however, gallbladder appears irregular, along the fundus of the gallbladder with a suggestion of capsular thickening measuring 2.4 x 0.9 cm. In addition the gallbladder appears inseparable from the adjacent portion of the liver, the possibility of gallbladder carcinoma should be considered and excluded clinically. Correlation with recent ultrasound demonstrates poor visualization of the gallbladder. The findings are not typical for acute cholecystitis in the CT images. Correlation with MR and MRCP is recommended. Pancreas: Unremarkable. No pancreatic ductal dilatation or surrounding inflammatory changes. Spleen: Normal in size without focal abnormality. Adrenals/Urinary Tract: Adrenal glands are unremarkable. Kidneys are normal, without renal calculi, focal lesion, or hydronephrosis. Bladder is unremarkable. Stomach/Bowel: There is mesenteric nodular thickening along  the mid lower abdominal cavity better seen on images 56-60 and may suggest peritoneal carcinomatosis. Soft tissue nodular masses noted in the left lower quadrant region of the pelvis on image number 65 measuring 3.6 x 2.7 cm this could correlate with an ovarian neoplastic lesion. Again not present on prior examination 2024 Reproductive: Prior hysterectomy Other: No abdominal wall hernia or abnormality. No abdominopelvic ascites. Musculoskeletal: Extensive postsurgical changes of the thoracolumbar spine with intervertebral disc replacements L2-L3 L4 and L5. IMPRESSION: *Multiple pulmonary nodules in the lower lobes bilaterally suspicious for metastatic lung disease. *Irregular appearance of the gallbladder with a suggestion of capsular thickening along the fundus of the gallbladder. In addition the gallbladder appears inseparable from the adjacent portion of the liver,  the possibility of gallbladder carcinoma should be considered and excluded clinically. Correlation with recent ultrasound demonstrates poor visualization of the gallbladder. The findings are not typical for acute cholecystitis in the CT images. Correlation with MR and MRCP is recommended. *Mesenteric nodular thickening along the mid lower abdominal cavity may suggest peritoneal carcinomatosis. *Soft tissue nodular mass in the left lower quadrant region of the pelvis measuring 3.6 x 2.7 cm this could correlate with an ovarian neoplastic lesion. Again not present on prior examination 2024. *Extensive postsurgical changes of the thoracolumbar spine with intervertebral disc replacements L2-L3 L4 and L5. Electronically Signed   By: Fredrich Jefferson M.D.   On: 12/14/2023 15:18   US  Abdomen Limited RUQ (LIVER/GB) Result Date: 12/14/2023 CLINICAL DATA:  [id number] RUQ pain [id number] EXAM: ULTRASOUND ABDOMEN LIMITED RIGHT UPPER QUADRANT COMPARISON:  Nov 19, 2022 FINDINGS: Gallbladder: The gallbladder was not well visualized or evaluated. The sonographer reports right upper quadrant pain in this region during the exam. Common bile duct: Diameter: 4 mm Liver: Normal echogenicity. No focal lesion identified. No intrahepatic biliary ductal dilation. Portal vein is patent on color Doppler imaging with normal direction of blood flow towards the liver. Other: None. IMPRESSION: 1. The gallbladder was not well visualized or evaluated during the exam; however, the sonographer reports right upper quadrant pain in this region. A follow-up CT of the abdomen and pelvis with IV contrast could be considered for further characterization. 2. No intrahepatic or extrahepatic biliary ductal dilation. Electronically Signed   By: Rance Burrows M.D.   On: 12/14/2023 13:05     The total time spent in the appointment was 55 minutes encounter with patients including review of chart and various tests results, discussions about plan of care and  coordination of care plan   All questions were answered. The patient knows to call the clinic with any problems, questions or concerns. No barriers to learning was detected.  Jacqualin Mate, NP 6/3/202510:45 AM   Attending Note  I personally saw the patient, reviewed the chart and examined the patient. The plan of care was discussed with the patient and the admitting team. I agree with the assessment and plan as documented above. Thank you very much for the consultation. We did discuss the imaging findings and concern for possible gall bladder primary with concern for metastatic disease. We discussed the palliative intent of treatment. Will see her again once path is available. Agree with palliative consult for pain management.

## 2023-12-15 NOTE — Procedures (Signed)
 Pre procedural Dx: Omental thickening  Post procedural Dx: Same  Technically successful CT guided biopsy of midline omentum   EBL: None.   Complications: None immediate.   Zettie Hillock, MD Pager #: [phone]

## 2023-12-15 NOTE — Plan of Care (Signed)

## 2023-12-15 NOTE — Progress Notes (Signed)
 PROGRESS NOTE    Janice Pace  ZOX:[id number] DOB: 1945/11/04 DOA: 12/14/2023 PCP: Odilia Bennett, PA-C   Brief Narrative:  HPI: Janice Pace is a 78 y.o. female with medical history significant of essential hypertension, osteoarthritis, COPD/asthma, GERD, depression, anxiety, hiatal hernia, chronic back pain comes to the hospital complains of abdominal pain.  Patient states she started having abdominal pain which has been constant in the epigastric region for about past 2 weeks.  Denies any nausea, vomiting, fever, weight loss, chills and any other complaints.  For the most part she has been able to tolerate p.o.   Colonoscopy 2024 showing multiple polyps which were removed with cold snare Had hysterectomy about 50 years ago Reports that she is not up to date with her GYN screening   In the ER today her routine blood work was unremarkable with CT abdomen pelvis showed multiple pleural nodules, abnormal gallbladder pathology and peritoneal carcinomatosis.  Medical team requested to admit this patient.  Assessment & Plan:   Principal Problem:   Abdominal pain Active Problems:   Unilateral primary osteoarthritis, left knee   Mixed hyperlipidemia   GERD (gastroesophageal reflux disease)   COPD with asthma (HCC)   Generalized anxiety disorder   DDD (degenerative disc disease), lumbar  Abdominal pain/abdominal carcinomatosis with presumed malignancy of gallbladder, POA, - CT abdomen pelvis shows concerns of multiple pulmonary nodule, gallbladder thickening concerning for carcinoma, mesenteric nodular thickening concerning for peritoneal carcinomatosis and nodular mass around her left ovary. CA 19-9, CA125, CEA ordered and pending.  MRCP findings consistent with gallbladder malignancy and associated metastatic disease.  Admitting hospitalist consulted Dr. Daisey Dryer from GYN oncology who will see the patient today.  Also consulted IR to help obtain biopsy from any of the above accessible site.   I have now consulted general oncology.  Will start on full liquid diet once biopsy is done.  Hypokalemia: Replenished.   COPD/asthma - Stable.  Bronchodilators   Pulmonary nodules - Follows with outpatient [facility].  But likely metastatic disease.   Osteoarthritis - Pain control   Hyperlipidemia - Statin   GERD - PPI   Generalized anxiety disorder - Continue home meds   CAD - Follows outpatient Dr. Berry Bristol.  Currently on aspirin  and statin   Essential hypertension - On Aldactone  which is currently on hold.  Continue Toprol -XL.  IV as needed   Chronic back pain - Follows outpatient neurosurgery  DVT prophylaxis: enoxaparin (LOVENOX) injection 40 mg Start: 12/15/23 0000   Code Status: Limited: Do not attempt resuscitation (DNR) -DNR-LIMITED -Do Not Intubate/DNI   Family Communication: Husband present at bedside.  Plan of care discussed with patient in length and he/she verbalized understanding and agreed with it.  Status is: Observation The patient will require care spanning > 2 midnights and should be moved to inpatient because: Needs biopsy and evaluation by oncology.   Estimated body mass index is 31.09 kg/m as calculated from the following:   Height as of 09/23/23: 5\' 2"  (1.575 m).   Weight as of this encounter: 77.1 kg.    Nutritional Assessment: Body mass index is 31.09 kg/m.Aaron Aas Seen by dietician.  I agree with the assessment and plan as outlined below: Nutrition Status:        . Skin Assessment: I have examined the patient's skin and I agree with the wound assessment as performed by the wound care RN as outlined below:    Consultants:  Oncology and IR  Procedures:  As above  Antimicrobials:  Anti-infectives (From admission, onward)    None         Subjective: Patient seen and examined, husband at bedside.  She states that her abdominal pain is better today as long as she is getting pain medications.  No nausea or any other  complaint.  Unfortunately, I had to break the news about the MRCP results indicating high suspicion of possible gallbladder as primary malignancy with carcinomatosis.  As expected, patient was sent.  I informed her that oncology will be coming to talk to her.  She was hopeful for some treatment options.  Objective: Vitals:   12/14/23 2006 12/14/23 2359 12/15/23 0450 12/15/23 0615  BP: (!) 158/63 129/62 126/68   Pulse: 66 71 77   Resp: 17 18 16    Temp: 97.8 F (36.6 C) 97.7 F (36.5 C) 97.9 F (36.6 C)   TempSrc: Oral Oral Oral   SpO2: 100% 97% 98% 100%  Weight:   77.1 kg     Intake/Output Summary (Last 24 hours) at 12/15/2023 0752 Last data filed at 12/15/2023 0500 Gross per 24 hour  Intake 1515 ml  Output --  Net 1515 ml   Filed Weights   12/15/23 0450  Weight: 77.1 kg    Examination:  General exam: Appears calm and comfortable  Respiratory system: Clear to auscultation. Respiratory effort normal. Cardiovascular system: S1 & S2 heard, RRR. No JVD, murmurs, rubs, gallops or clicks. No pedal edema. Gastrointestinal system: Abdomen is nondistended, soft and moderate tenderness at epigastrium and right upper quadrant. No organomegaly or masses felt. Normal bowel sounds heard. Central nervous system: Alert and oriented. No focal neurological deficits. Extremities: Symmetric 5 x 5 power. Skin: No rashes, lesions or ulcers Psychiatry: Judgement and insight appear normal. Mood & affect appropriate.    Data Reviewed: I have personally reviewed following labs and imaging studies  CBC: Recent Labs  Lab 12/14/23 1044 12/14/23 1825 12/15/23 0424  WBC 5.2 5.0 5.2  HGB 13.3 12.1 11.9*  HCT 40.6 37.1 36.4  MCV 90.2 89.2 91.0  PLT 215 195 187   Basic Metabolic Panel: Recent Labs  Lab 12/14/23 1044 12/14/23 1825 12/15/23 0424  NA 139  --  136  K 4.1  --  3.3*  CL 108  --  106  CO2 23  --  25  GLUCOSE 119*  --  135*  BUN 7*  --  <5*  CREATININE 0.66 0.63 0.80  CALCIUM   9.0  --  8.5*  MG  --   --  2.0   GFR: Estimated Creatinine Clearance: 56.6 mL/min (by C-G formula based on SCr of 0.8 mg/dL). Liver Function Tests: Recent Labs  Lab 12/14/23 1044  AST 19  ALT 16  ALKPHOS 68  BILITOT 0.8  PROT 6.3*  ALBUMIN  3.5   Recent Labs  Lab 12/14/23 1044  LIPASE 35   No results for input(s): "AMMONIA" in the last 168 hours. Coagulation Profile: Recent Labs  Lab 12/14/23 1825  INR 1.1   Cardiac Enzymes: No results for input(s): "CKTOTAL", "CKMB", "CKMBINDEX", "TROPONINI" in the last 168 hours. BNP (last 3 results) No results for input(s): "PROBNP" in the last 8760 hours. HbA1C: No results for input(s): "HGBA1C" in the last 72 hours. CBG: No results for input(s): "GLUCAP" in the last 168 hours. Lipid Profile: No results for input(s): "CHOL", "HDL", "LDLCALC", "TRIG", "CHOLHDL", "LDLDIRECT" in the last 72 hours. Thyroid  Function Tests: No results for input(s): "TSH", "T4TOTAL", "FREET4", "T3FREE", "THYROIDAB" in the last 72 hours.  Anemia Panel: No results for input(s): "VITAMINB12", "FOLATE", "FERRITIN", "TIBC", "IRON", "RETICCTPCT" in the last 72 hours. Sepsis Labs: No results for input(s): "PROCALCITON", "LATICACIDVEN" in the last 168 hours.  No results found for this or any previous visit (from the past 240 hours).   Radiology Studies: MR ABDOMEN MRCP W WO CONTAST Result Date: 12/14/2023 CLINICAL DATA:  Gallbladder malignancy suspected, gallbladder mass, pulmonary metastases, and peritoneal metastatic disease identified by prior CT EXAM: MRI ABDOMEN WITHOUT AND WITH CONTRAST (INCLUDING MRCP) TECHNIQUE: Multiplanar multisequence MR imaging of the abdomen was performed both before and after the administration of intravenous contrast. Heavily T2-weighted images of the biliary and pancreatic ducts were obtained, and three-dimensional MRCP images were rendered by post processing. CONTRAST:  7mL GADAVIST GADOBUTROL 1 MMOL/ML IV SOLN COMPARISON:  CT  abdomen pelvis, 12/14/2023 FINDINGS: Lower chest: No acute abnormality. Numerous small bilateral pulmonary nodules, better assessed by CT (series 14, image 5). Hepatobiliary: Thickened, contracted gallbladder with a heterogeneous mass arising from the gallbladder fundus, contiguous with the adjacent liver parenchyma of hepatic segment IVB measuring 3.5 x 2.8 cm (series 14, image 37). Multiple small rim enhancing lesions within the liver parenchyma, for example in anterior hepatic segment II measuring 1.2 x 1.1 cm (series 14, image 19). Pancreas: Unremarkable. No pancreatic ductal dilatation or surrounding inflammatory changes. Spleen: Normal in size without significant abnormality. Adrenals/Urinary Tract: Unchanged, definitively benign macroscopic fat containing adrenal adenomata requiring no specific further follow-up or characterization. Kidneys are normal, without obvious renal calculi, solid lesion, or hydronephrosis. Stomach/Bowel: Stomach is within normal limits. No evidence of bowel wall thickening, distention, or inflammatory changes. Vascular/Lymphatic: Aortic atherosclerosis. Enlarged, necrotic appearing portacaval and porta hepatis lymph nodes measuring up 2 2.9 x 1.6 cm (series 14, image 31). Small, although rim enhancing and abnormal appearing retroperitoneal lymph nodes (series 14, image 50). Other: No abdominal wall hernia or abnormality. No ascites. Peritoneal stranding and nodularity, incompletely visualized on this examination although present in the left upper quadrant (series 14, image 29). Musculoskeletal: No acute or significant osseous findings. IMPRESSION: 1. Thickened, contracted gallbladder with a heterogeneous mass arising from the gallbladder fundus, contiguous with the adjacent liver parenchyma of hepatic segment IVB measuring 3.5 x 2.8 cm. 2. Multiple small rim enhancing lesions within the liver parenchyma. 3. Enlarged, necrotic appearing portacaval and porta hepatis lymph nodes. Small,  although rim enhancing and abnormal appearing retroperitoneal lymph nodes. 4. Peritoneal stranding and nodularity, incompletely visualized on this examination although present in the left upper quadrant, consistent with peritoneal metastatic disease. 5. Numerous small bilateral pulmonary nodules, better assessed by CT. 6. 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 Aortic Atherosclerosis (ICD10-I70.0). Electronically Signed   By: Fredricka Jenny M.D.   On: 12/14/2023 21:08   MR 3D Recon At Scanner Result Date: 12/14/2023 CLINICAL DATA:  Gallbladder malignancy suspected, gallbladder mass, pulmonary metastases, and peritoneal metastatic disease identified by prior CT EXAM: MRI ABDOMEN WITHOUT AND WITH CONTRAST (INCLUDING MRCP) TECHNIQUE: Multiplanar multisequence MR imaging of the abdomen was performed both before and after the administration of intravenous contrast. Heavily T2-weighted images of the biliary and pancreatic ducts were obtained, and three-dimensional MRCP images were rendered by post processing. CONTRAST:  7mL GADAVIST GADOBUTROL 1 MMOL/ML IV SOLN COMPARISON:  CT abdomen pelvis, 12/14/2023 FINDINGS: Lower chest: No acute abnormality. Numerous small bilateral pulmonary nodules, better assessed by CT (series 14, image 5). Hepatobiliary: Thickened, contracted gallbladder with a heterogeneous mass arising from the gallbladder fundus, contiguous with the adjacent liver  parenchyma of hepatic segment IVB measuring 3.5 x 2.8 cm (series 14, image 37). Multiple small rim enhancing lesions within the liver parenchyma, for example in anterior hepatic segment II measuring 1.2 x 1.1 cm (series 14, image 19). Pancreas: Unremarkable. No pancreatic ductal dilatation or surrounding inflammatory changes. Spleen: Normal in size without significant abnormality. Adrenals/Urinary Tract: Unchanged, definitively benign macroscopic fat containing adrenal adenomata  requiring no specific further follow-up or characterization. Kidneys are normal, without obvious renal calculi, solid lesion, or hydronephrosis. Stomach/Bowel: Stomach is within normal limits. No evidence of bowel wall thickening, distention, or inflammatory changes. Vascular/Lymphatic: Aortic atherosclerosis. Enlarged, necrotic appearing portacaval and porta hepatis lymph nodes measuring up 2 2.9 x 1.6 cm (series 14, image 31). Small, although rim enhancing and abnormal appearing retroperitoneal lymph nodes (series 14, image 50). Other: No abdominal wall hernia or abnormality. No ascites. Peritoneal stranding and nodularity, incompletely visualized on this examination although present in the left upper quadrant (series 14, image 29). Musculoskeletal: No acute or significant osseous findings. IMPRESSION: 1. Thickened, contracted gallbladder with a heterogeneous mass arising from the gallbladder fundus, contiguous with the adjacent liver parenchyma of hepatic segment IVB measuring 3.5 x 2.8 cm. 2. Multiple small rim enhancing lesions within the liver parenchyma. 3. Enlarged, necrotic appearing portacaval and porta hepatis lymph nodes. Small, although rim enhancing and abnormal appearing retroperitoneal lymph nodes. 4. Peritoneal stranding and nodularity, incompletely visualized on this examination although present in the left upper quadrant, consistent with peritoneal metastatic disease. 5. Numerous small bilateral pulmonary nodules, better assessed by CT. 6. 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 Aortic Atherosclerosis (ICD10-I70.0). Electronically Signed   By: Fredricka Jenny M.D.   On: 12/14/2023 21:08   CT ABDOMEN PELVIS W CONTRAST Result Date: 12/14/2023 CLINICAL DATA:  Right upper quadrant abdominal pain EXAM: CT ABDOMEN AND PELVIS WITH CONTRAST TECHNIQUE: Multidetector CT imaging of the abdomen and pelvis was performed using the standard protocol  following bolus administration of intravenous contrast. RADIATION DOSE REDUCTION: This exam was performed according to the departmental dose-optimization program which includes automated exposure control, adjustment of the mA and/or kV according to patient size and/or use of iterative reconstruction technique. CONTRAST:  OMNIPAQUE  IOHEXOL  300 MG/ML  SOLN COMPARISON:  Nov 19, 2022 FINDINGS: Lower chest: Several nodules in both lower lobes are identified, measuring between 3 and 4 mm in the right lower lobe and 3 and 5 mm in the lingula, as well as multiple nodules in the left lower lobe posteromedial segment the largest 9 mm in diameter on image number 8. And in the right lower lobe image number 25 measuring 12 mm in diameter. These nodules were not identified on the prior CT from 2024 there are 4 suspicious for metastatic lung disease. Hepatobiliary: Liver appears normal in size no parenchymal lesions however, gallbladder appears irregular, along the fundus of the gallbladder with a suggestion of capsular thickening measuring 2.4 x 0.9 cm. In addition the gallbladder appears inseparable from the adjacent portion of the liver, the possibility of gallbladder carcinoma should be considered and excluded clinically. Correlation with recent ultrasound demonstrates poor visualization of the gallbladder. The findings are not typical for acute cholecystitis in the CT images. Correlation with MR and MRCP is recommended. Pancreas: Unremarkable. No pancreatic ductal dilatation or surrounding inflammatory changes. Spleen: Normal in size without focal abnormality. Adrenals/Urinary Tract: Adrenal glands are unremarkable. Kidneys are normal, without renal calculi, focal lesion, or hydronephrosis. Bladder is unremarkable. Stomach/Bowel: There is mesenteric nodular thickening  along the mid lower abdominal cavity better seen on images 56-60 and may suggest peritoneal carcinomatosis. Soft tissue nodular masses noted in the left  lower quadrant region of the pelvis on image number 65 measuring 3.6 x 2.7 cm this could correlate with an ovarian neoplastic lesion. Again not present on prior examination 2024 Reproductive: Prior hysterectomy Other: No abdominal wall hernia or abnormality. No abdominopelvic ascites. Musculoskeletal: Extensive postsurgical changes of the thoracolumbar spine with intervertebral disc replacements L2-L3 L4 and L5. IMPRESSION: *Multiple pulmonary nodules in the lower lobes bilaterally suspicious for metastatic lung disease. *Irregular appearance of the gallbladder with a suggestion of capsular thickening along the fundus of the gallbladder. In addition the gallbladder appears inseparable from the adjacent portion of the liver, the possibility of gallbladder carcinoma should be considered and excluded clinically. Correlation with recent ultrasound demonstrates poor visualization of the gallbladder. The findings are not typical for acute cholecystitis in the CT images. Correlation with MR and MRCP is recommended. *Mesenteric nodular thickening along the mid lower abdominal cavity may suggest peritoneal carcinomatosis. *Soft tissue nodular mass in the left lower quadrant region of the pelvis measuring 3.6 x 2.7 cm this could correlate with an ovarian neoplastic lesion. Again not present on prior examination 2024. *Extensive postsurgical changes of the thoracolumbar spine with intervertebral disc replacements L2-L3 L4 and L5. Electronically Signed   By: Fredrich Jefferson M.D.   On: 12/14/2023 15:18   US  Abdomen Limited RUQ (LIVER/GB) Result Date: 12/14/2023 CLINICAL DATA:  [id number] RUQ pain [id number] EXAM: ULTRASOUND ABDOMEN LIMITED RIGHT UPPER QUADRANT COMPARISON:  Nov 19, 2022 FINDINGS: Gallbladder: The gallbladder was not well visualized or evaluated. The sonographer reports right upper quadrant pain in this region during the exam. Common bile duct: Diameter: 4 mm Liver: Normal echogenicity. No focal lesion identified. No  intrahepatic biliary ductal dilation. Portal vein is patent on color Doppler imaging with normal direction of blood flow towards the liver. Other: None. IMPRESSION: 1. The gallbladder was not well visualized or evaluated during the exam; however, the sonographer reports right upper quadrant pain in this region. A follow-up CT of the abdomen and pelvis with IV contrast could be considered for further characterization. 2. No intrahepatic or extrahepatic biliary ductal dilation. Electronically Signed   By: Rance Burrows M.D.   On: 12/14/2023 13:05    Scheduled Meds:  arformoterol   15 mcg Nebulization BID   buPROPion  ER  200 mg Oral BID   enoxaparin (LOVENOX) injection  40 mg Subcutaneous Q24H   metoprolol  succinate  50 mg Oral Daily   pantoprazole   40 mg Oral QAC breakfast   potassium chloride   40 mEq Oral Q4H   revefenacin   175 mcg Nebulization Daily   rosuvastatin   10 mg Oral Daily   Continuous Infusions:  dextrose  5 % and 0.45 % NaCl 75 mL/hr at 12/14/23 1824     LOS: 0 days   Modena Andes, MD [facility]  12/15/2023, 7:52 AM   *Please note that this is a verbal dictation therefore any spelling or grammatical errors are due to the "[employer]" system interpretation.  Please page via Amion and do not message via secure chat for urgent patient care matters. Secure chat can be used for non urgent patient care matters.  How to contact the [facility] Attending or Consulting provider 7A - 7P or covering provider during after hours 7P -7A, for this patient?  Check the care team in [facility] and look for a) attending/consulting [facility] provider listed and b) the  [facility] team listed. Page or secure chat 7A-7P. Log into [email or web address] and use [facility]'s universal password to access. If you do not have the password, please contact the hospital operator. Locate the [facility] provider you are looking for under [facility] and page to a number that you can be directly reached. If you still have  difficulty reaching the provider, please page the [facility] (Director on Call) for the Hospitalists listed on amion for assistance.

## 2023-12-15 NOTE — Progress Notes (Addendum)
 MEDICATION-RELATED CONSULT NOTE   IR Procedure Consult - Anticoagulant/Antiplatelet PTA/Inpatient Med List Review by Pharmacist    Procedure: image guided peritoneal mass biopsy     Completed: 12/15/23 11:37 am  Post-Procedural bleeding risk per IR MD assessment:  standard  Antithrombotic medications on inpatient or PTA profile prior to procedure:   none    Recommended restart time per IR Post-Procedure Guidelines: day + 1, next AM    Plan:    LMWH 40 to start 6/4 am  Janice Pace, Pharm.D Use secure chat for questions 12/15/2023 11:55 AM

## 2023-12-15 NOTE — TOC Initial Note (Signed)
 Transition of Care [facility]) - Initial/Assessment Note    Patient Details  Name: Janice Pace MRN: [id number] Date of Birth: 10-21-1945  Transition [facility] CM/SW Contact:    Kathryn Parish, RN Phone Number: 12/15/2023, 12:18 PM  Clinical Narrative:                 MOON completed. CM spoke with patient and spouse Myrtie Atkinson in the room. PTA lives in a house; PCP/insurance verified; DME- cane, walker, rollator, 3/1, WC; No HH, oxgyen or SDOH needs; Patients husband will transport home at discharge. No TOC needs identified during visit. TOC will follow progression to discharge.  Expected Discharge Plan: Home/Self Care Barriers to Discharge: No Barriers Identified   Patient Goals and CMS Choice Patient states their goals for this hospitalization and ongoing recovery are:: Home with spouse CMS Medicare.gov Compare Post Acute Care list provided to::  (NA) Choice offered to / list presented to : NA      Expected Discharge Plan and Services In-house Referral: NA Discharge Planning Services: CM Consult Post Acute Care Choice: NA Living arrangements for the past 2 months: Single Family Home                 DME Arranged: N/A DME Agency: NA       HH Arranged:  (NA) HH Agency: NA        Prior Living Arrangements/Services Living arrangements for the past 2 months: Single Family Home Lives with:: Spouse Patient language and need for interpreter reviewed:: Yes Do you feel safe going back to the place where you live?: Yes      Need for Family Participation in Patient Care: No (Comment) Care giver support system in place?: Yes (comment) Current home services: DME (cane, walker, rollator, 3/1, WC) Criminal Activity/Legal Involvement Pertinent to Current Situation/Hospitalization: No - Comment as needed  Activities of Daily Living      Permission Sought/Granted Permission sought to share information with : Case Manager Permission granted to share information with : Yes, Verbal  Permission Granted  Share Information with NAME: Myrtie Atkinson     Permission granted to share info w Relationship: Spouse  Permission granted to share info w Contact Information: [phone]  Emotional Assessment Appearance:: Appears stated age Attitude/Demeanor/Rapport: Gracious Affect (typically observed): Appropriate Orientation: : Oriented to Self, Oriented to Place, Oriented to  Time, Oriented to Situation Alcohol / Substance Use: Not Applicable Psych Involvement: No (comment)  Admission diagnosis:  Abdominal pain [R10.9] Patient Active Problem List   Diagnosis Date Noted   Abdominal pain 12/14/2023   DDD (degenerative disc disease), lumbar 09/17/2022   Essential hypertension 06/21/2022   Ovarian failure 05/28/2022   COPD with asthma (HCC) 05/06/2022   Leg cramps 11/25/2021   Menopausal vasomotor syndrome 11/25/2021   Bilateral adrenal adenomas 08/13/2021   Allergic rhinitis due to pollen 08/13/2021   Asthma without status asthmaticus 08/13/2021   Fatty liver 08/13/2021   Lumbosacral spondylosis without myelopathy 08/13/2021   Generalized anxiety disorder 08/13/2021   Colitis 03/02/2021   Ileus (HCC) 03/02/2021   GERD (gastroesophageal reflux disease) 03/02/2021   Spinal stenosis of lumbar region 02/27/2021   Osteoporosis 12/11/2020   Lumbago with sciatica, unspecified side 10/16/2020   Hardening of the aorta (main artery of the heart) (HCC) 07/23/2020   Prediabetes 10/18/2019   Mixed hyperlipidemia 10/03/2019   Vitamin D  deficiency 10/03/2019   Severe obesity (BMI 35.0-35.9 with comorbidity) (HCC) 10/03/2019   Unilateral primary osteoarthritis, left knee 08/18/2019   Carpal tunnel  syndrome, left upper limb 11/06/2017   Hx of adenomatous colonic polyps 03/13/2003   PCP:  Odilia Bennett PA-C Pharmacy:   [facility] [address]), [place] - [address] [place]) [place] [address] Phone: [phone] Fax: [phone]     Social  Drivers of Health (SDOH) Social History: SDOH Screenings   Food Insecurity: No Food Insecurity (12/14/2023)  Housing: Low Risk  (12/14/2023)  Transportation Needs: No Transportation Needs (12/14/2023)  Utilities: Not At Risk (12/14/2023)  Alcohol Screen: Low Risk  (08/25/2023)  Depression (PHQ2-9): Low Risk  (08/26/2023)  Financial Resource Strain: Low Risk  (08/25/2023)  Physical Activity: Inactive (08/25/2023)  Social Connections: Socially Integrated (12/14/2023)  Stress: No Stress Concern Present (08/25/2023)  Tobacco Use: Medium Risk (12/14/2023)  Health Literacy: Adequate Health Literacy (06/03/2023)   SDOH Interventions:     Readmission Risk Interventions     No data to display

## 2023-12-15 NOTE — Consult Note (Signed)
 Chief Complaint: Abdominal pain  Referring Provider(s): Amin,A  Supervising Physician: Babcock,G  Patient Status: [facility] - In-pt  History of Present Illness: Janice Pace is a 78 y.o. female ex smoker with past medical history significant for arthritis, hypertension, asthma, colon polyps, prior hysterectomy, chronic back pain, COPD, GERD, dermatosviros arcoma and protuverans who was admitted to [facility] yesterday with persistent abdominal  pain.  CT of the abdomen and pelvis revealed:  *Multiple pulmonary nodules in the lower lobes bilaterally suspicious for metastatic lung disease. *Irregular appearance of the gallbladder with a suggestion of capsular thickening along the fundus of the gallbladder. In addition the gallbladder appears inseparable from the adjacent portion of the liver, the possibility of gallbladder carcinoma should be considered and excluded clinically. Correlation with recent ultrasound demonstrates poor visualization of the gallbladder. The findings are not typical for acute cholecystitis in the CT images. Correlation with MR and MRCP is recommended. *Mesenteric nodular thickening along the mid lower abdominal cavity may suggest peritoneal carcinomatosis. *Soft tissue nodular mass in the left lower quadrant region of the pelvis measuring 3.6 x 2.7 cm this could correlate with an ovarian neoplastic lesion. Again not present on prior examination 2024. *Extensive postsurgical changes of the thoracolumbar spine with intervertebral disc replacements L2-L3 L4 and L5.  MRI of the abdomen revealed:  1. Thickened, contracted gallbladder with a heterogeneous mass arising from the gallbladder fundus, contiguous with the adjacent liver parenchyma of hepatic segment IVB measuring 3.5 x 2.8 cm. 2. Multiple small rim enhancing lesions within the liver parenchyma. 3. Enlarged, necrotic appearing portacaval and porta hepatis lymph nodes. Small, although rim  enhancing and abnormal appearing retroperitoneal lymph nodes. 4. Peritoneal stranding and nodularity, incompletely visualized on this examination although present in the left upper quadrant, consistent with peritoneal metastatic disease. 5. Numerous small bilateral pulmonary nodules, better assessed by CT. 6. 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   Aortic Atherosclerosis  CA 125, CEA and CA 19-9 are pending.  Patient is afebrile, WBC normal, hemoglobin 11.9, platelets normal, PT/INR normal, creatinine normal  Request now received from primary care team for image guided peritoneal mass biopsy     Pt is DNR limited  Past Medical History:  Diagnosis Date   Arthritis    Asthma    Bronchitis    Cancer (HCC)    dermatosivros arcoma and protuverans   Chronic back pain    degenerative disc disease   Constipation    stool softener daily   Constipation    COPD (chronic obstructive pulmonary disease) (HCC)    GERD (gastroesophageal reflux disease)    takes Prilosec daily   GERD (gastroesophageal reflux disease)    Heart murmur    Heart valve disorder    Hemorrhoids    History of hiatal hernia    Insomnia    d/t chantix    Joint pain    Nocturia    Personal history of colonic adenoma 03/13/2003   03/13/2003 - 5 mm adenoma   Pre-diabetes    Prediabetes    White coat syndrome without diagnosis of hypertension     Past Surgical History:  Procedure Laterality Date   ABDOMINAL HYSTERECTOMY     ANTERIOR LAT LUMBAR FUSION N/A 02/27/2021   Procedure: Anterior lateral interbody fusion - lateral two - lateral three - lateral three - lateral four with exploration fusion L4-S1;  Surgeon: Gearl Keens, MD;  Location: [facility] OR;  Service: Neurosurgery;  Laterality: N/A;  BACK SURGERY  1988   BACK SURGERY  09/03/2011   rods,screws x 8    BACK SURGERY     09/2022   BUNIONECTOMY     bilateral   BUNIONECTOMY     bil feet    COLONOSCOPY     dermatosibrosarcoma protuberans     LAMINECTOMY WITH POSTERIOR LATERAL ARTHRODESIS LEVEL 3 N/A 02/27/2021   Procedure: Posterior augmentation with pedicle screws Latreal one - lateral four - Posterior Lateral and Interbody fusion;  Surgeon: Gearl Keens, MD;  Location: [facility] OR;  Service: Neurosurgery;  Laterality: N/A;   PARTIAL HYSTERECTOMY     RECTOCELE REPAIR     Rectum Repair     RESECTION TUMOR WRIST RADICAL     x 2 rt   TUBAL LIGATION  1974   TUBAL LIGATION     WRIST SURGERY     left    Allergies: Sulfa antibiotics, Forteo [parathyroid hormone (recomb)], Atorvastatin, and Metformin hcl  Medications: Prior to Admission medications   Medication Sig Start Date End Date Taking? Authorizing Provider  albuterol  (VENTOLIN  HFA) 108 (90 Base) MCG/ACT inhaler Inhale 1-2 puffs into the lungs every 6 (six) hours as needed for wheezing or shortness of breath. 05/06/22  Yes Cobb, Mariah Shines, NP  aspirin  81 MG EC tablet Take 81 mg by mouth daily.   Yes [provider]  budesonide  (PULMICORT ) 0.5 MG/2ML nebulizer solution Generic for Pulmicort . Inhale one vial in nebulizer twice a day. Rinse mouth after use. Patient taking differently: Take 0.5 mg by nebulization 2 (two) times daily. 01/19/23  Yes Wilfredo Hanly, MD  buPROPion  ER (WELLBUTRIN  SR) 100 MG 12 hr tablet Take 2 tablets (200 mg total) by mouth 2 (two) times daily. 09/25/23  Yes Maryclare Smoke A, PA  chlordiazePOXIDE  (LIBRIUM ) 10 MG capsule Take 1 capsule (10 mg total) by mouth as needed for anxiety. Maximum twice daily. Patient taking differently: Take 10 mg by mouth as needed for anxiety (max of twice daily). 09/25/23  Yes Maryclare Smoke A, PA  docusate sodium  (COLACE) 100 MG capsule Take 500 mg by mouth at bedtime.   Yes [provider]  formoterol  (PERFOROMIST ) 20 MCG/2ML nebulizer solution Substituted for: Perforomist  Solution Inhale one vial in nebulizer twice a day. Patient taking differently: Take  20 mcg by nebulization 2 (two) times daily. 01/19/23  Yes Wilfredo Hanly, MD  metoprolol  succinate (TOPROL -XL) 50 MG 24 hr tablet TAKE 1 TABLET BY MOUTH ONCE DAILY. TAKE WITH OR IMMEDIATELY FOLLOWING A MEAL Patient taking differently: Take 50 mg by mouth at bedtime. 11/11/23  Yes Laneta Pintos, MD  naltrexone  (DEPADE) 50 MG tablet Take 0.5 tablets (25 mg total) by mouth daily. 06/25/23  Yes Maryclare Smoke A, PA  NON FORMULARY Take 8 oz by mouth See admin instructions. Mushroom coffee: Drink 8-10 ounces by mouth once a day as needed to aid bowel movements   Yes [provider]  omeprazole  (PRILOSEC) 20 MG capsule Take 1 capsule (20 mg total) by mouth daily as needed (acid reflux). 06/25/23  Yes Maryclare Smoke A, PA  revefenacin  (YUPELRI ) 175 MCG/3ML nebulizer solution Take 3 mLs (175 mcg total) by nebulization daily. 02/04/23  Yes Wilfredo Hanly, MD  rosuvastatin  (CRESTOR ) 10 MG tablet Take 1 tablet (10 mg total) by mouth daily. 08/26/23  Yes Maryclare Smoke A, PA  spironolactone  (ALDACTONE ) 25 MG tablet TAKE 1 TABLET BY MOUTH ONCE DAILY IN THE MORNING 05/19/23  Yes Knox Perl, MD  alendronate  (FOSAMAX ) 70 MG  tablet Take 1 tablet (70 mg total) by mouth once a week. Take with a full glass of water on an empty stomach. Patient not taking: Reported on 12/14/2023 08/26/23   Maryclare Smoke A, PA  ciprofloxacin  (CIPRO ) 500 MG tablet Take 1 tablet (500 mg total) by mouth every 12 (twelve) hours. Patient not taking: Reported on 12/14/2023 09/23/23   Raspet, Betsey Brow, PA-C     Family History  Problem Relation Age of Onset   Leukemia Mother    Anesthesia problems Neg Hx    Hypotension Neg Hx    Malignant hyperthermia Neg Hx    Pseudochol deficiency Neg Hx    Colon cancer Neg Hx    Colon polyps Neg Hx    Esophageal cancer Neg Hx    Stomach cancer Neg Hx    Rectal cancer Neg Hx     Social History   Socioeconomic History   Marital status: Married    Spouse name: david   Number of  children: 3   Years of education: Not on file   Highest education level: GED or equivalent  Occupational History   Occupation: retired  Tobacco Use   Smoking status: Former    Current packs/day: 0.00    Average packs/day: 1 pack/day for 50.0 years (50.0 ttl pk-yrs)    Types: Cigarettes    Start date: 08/16/1961    Quit date: 08/17/2011    Years since quitting: 12.3    Passive exposure: Never   Smokeless tobacco: Never  Vaping Use   Vaping status: Never Used  Substance and Sexual Activity   Alcohol use: No    Comment: occasional wine   Drug use: No   Sexual activity: Yes    Birth control/protection: Surgical, Other-see comments  Other Topics Concern   Not on file  Social History Narrative   ** Merged History Encounter **       Social Drivers of Health   Financial Resource Strain: Low Risk  (08/25/2023)   Overall Financial Resource Strain (CARDIA)    Difficulty of Paying Living Expenses: Not hard at all  Food Insecurity: No Food Insecurity (12/14/2023)   Hunger Vital Sign    Worried About Running Out of Food in the Last Year: Never true    Ran Out of Food in the Last Year: Never true  Transportation Needs: No Transportation Needs (12/14/2023)   PRAPARE - Administrator, Civil Service (Medical): No    Lack of Transportation (Non-Medical): No  Physical Activity: Inactive (08/25/2023)   Exercise Vital Sign    Days of Exercise per Week: 0 days    Minutes of Exercise per Session: 0 min  Stress: No Stress Concern Present (08/25/2023)   [employer] of Occupational Health - Occupational Stress Questionnaire    Feeling of Stress : Not at all  Social Connections: Socially Integrated (12/14/2023)   Social Connection and Isolation Panel [NHANES]    Frequency of Communication with Friends and Family: More than three times a week    Frequency of Social Gatherings with Friends and Family: Once a week    Attends Religious Services: More than 4 times per year    Active Member  of [employer] or Organizations: Yes    Attends Engineer, structural: More than 4 times per year    Marital Status: Married       Review of Systems see above, currently denies fever, headache, chest pain, dyspnea, cough, nausea, vomiting or bleeding.  She does have abdominal  and back pain  Vital Signs: BP 126/68 (BP Location: Left Arm)   Pulse 77   Temp 97.9 F (36.6 C) (Oral)   Resp 16   Wt 169 lb 15.6 oz (77.1 kg)   SpO2 96%   BMI 31.09 kg/m   Advance Care Plan: No documents on file  Physical Exam awake, alert.  Chest clear to auscultation bilaterally.  Heart with regular rate and rhythm.  Abdomen soft, positive bowel sounds, some mild generalized tenderness to palpation, more so midline/RUQ  Imaging: MR ABDOMEN MRCP W WO CONTAST Result Date: 12/14/2023 CLINICAL DATA:  Gallbladder malignancy suspected, gallbladder mass, pulmonary metastases, and peritoneal metastatic disease identified by prior CT EXAM: MRI ABDOMEN WITHOUT AND WITH CONTRAST (INCLUDING MRCP) TECHNIQUE: Multiplanar multisequence MR imaging of the abdomen was performed both before and after the administration of intravenous contrast. Heavily T2-weighted images of the biliary and pancreatic ducts were obtained, and three-dimensional MRCP images were rendered by post processing. CONTRAST:  7mL GADAVIST GADOBUTROL 1 MMOL/ML IV SOLN COMPARISON:  CT abdomen pelvis, 12/14/2023 FINDINGS: Lower chest: No acute abnormality. Numerous small bilateral pulmonary nodules, better assessed by CT (series 14, image 5). Hepatobiliary: Thickened, contracted gallbladder with a heterogeneous mass arising from the gallbladder fundus, contiguous with the adjacent liver parenchyma of hepatic segment IVB measuring 3.5 x 2.8 cm (series 14, image 37). Multiple small rim enhancing lesions within the liver parenchyma, for example in anterior hepatic segment II measuring 1.2 x 1.1 cm (series 14, image 19). Pancreas: Unremarkable. No pancreatic ductal  dilatation or surrounding inflammatory changes. Spleen: Normal in size without significant abnormality. Adrenals/Urinary Tract: Unchanged, definitively benign macroscopic fat containing adrenal adenomata requiring no specific further follow-up or characterization. Kidneys are normal, without obvious renal calculi, solid lesion, or hydronephrosis. Stomach/Bowel: Stomach is within normal limits. No evidence of bowel wall thickening, distention, or inflammatory changes. Vascular/Lymphatic: Aortic atherosclerosis. Enlarged, necrotic appearing portacaval and porta hepatis lymph nodes measuring up 2 2.9 x 1.6 cm (series 14, image 31). Small, although rim enhancing and abnormal appearing retroperitoneal lymph nodes (series 14, image 50). Other: No abdominal wall hernia or abnormality. No ascites. Peritoneal stranding and nodularity, incompletely visualized on this examination although present in the left upper quadrant (series 14, image 29). Musculoskeletal: No acute or significant osseous findings. IMPRESSION: 1. Thickened, contracted gallbladder with a heterogeneous mass arising from the gallbladder fundus, contiguous with the adjacent liver parenchyma of hepatic segment IVB measuring 3.5 x 2.8 cm. 2. Multiple small rim enhancing lesions within the liver parenchyma. 3. Enlarged, necrotic appearing portacaval and porta hepatis lymph nodes. Small, although rim enhancing and abnormal appearing retroperitoneal lymph nodes. 4. Peritoneal stranding and nodularity, incompletely visualized on this examination although present in the left upper quadrant, consistent with peritoneal metastatic disease. 5. Numerous small bilateral pulmonary nodules, better assessed by CT. 6. 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 Aortic Atherosclerosis (ICD10-I70.0). Electronically Signed   By: Fredricka Jenny M.D.   On: 12/14/2023 21:08   MR 3D Recon At Scanner Result Date:  12/14/2023 CLINICAL DATA:  Gallbladder malignancy suspected, gallbladder mass, pulmonary metastases, and peritoneal metastatic disease identified by prior CT EXAM: MRI ABDOMEN WITHOUT AND WITH CONTRAST (INCLUDING MRCP) TECHNIQUE: Multiplanar multisequence MR imaging of the abdomen was performed both before and after the administration of intravenous contrast. Heavily T2-weighted images of the biliary and pancreatic ducts were obtained, and three-dimensional MRCP images were rendered by post processing. CONTRAST:  7mL GADAVIST GADOBUTROL 1 MMOL/ML IV SOLN COMPARISON:  CT abdomen pelvis, 12/14/2023 FINDINGS: Lower chest: No acute abnormality. Numerous small bilateral pulmonary nodules, better assessed by CT (series 14, image 5). Hepatobiliary: Thickened, contracted gallbladder with a heterogeneous mass arising from the gallbladder fundus, contiguous with the adjacent liver parenchyma of hepatic segment IVB measuring 3.5 x 2.8 cm (series 14, image 37). Multiple small rim enhancing lesions within the liver parenchyma, for example in anterior hepatic segment II measuring 1.2 x 1.1 cm (series 14, image 19). Pancreas: Unremarkable. No pancreatic ductal dilatation or surrounding inflammatory changes. Spleen: Normal in size without significant abnormality. Adrenals/Urinary Tract: Unchanged, definitively benign macroscopic fat containing adrenal adenomata requiring no specific further follow-up or characterization. Kidneys are normal, without obvious renal calculi, solid lesion, or hydronephrosis. Stomach/Bowel: Stomach is within normal limits. No evidence of bowel wall thickening, distention, or inflammatory changes. Vascular/Lymphatic: Aortic atherosclerosis. Enlarged, necrotic appearing portacaval and porta hepatis lymph nodes measuring up 2 2.9 x 1.6 cm (series 14, image 31). Small, although rim enhancing and abnormal appearing retroperitoneal lymph nodes (series 14, image 50). Other: No abdominal wall hernia or  abnormality. No ascites. Peritoneal stranding and nodularity, incompletely visualized on this examination although present in the left upper quadrant (series 14, image 29). Musculoskeletal: No acute or significant osseous findings. IMPRESSION: 1. Thickened, contracted gallbladder with a heterogeneous mass arising from the gallbladder fundus, contiguous with the adjacent liver parenchyma of hepatic segment IVB measuring 3.5 x 2.8 cm. 2. Multiple small rim enhancing lesions within the liver parenchyma. 3. Enlarged, necrotic appearing portacaval and porta hepatis lymph nodes. Small, although rim enhancing and abnormal appearing retroperitoneal lymph nodes. 4. Peritoneal stranding and nodularity, incompletely visualized on this examination although present in the left upper quadrant, consistent with peritoneal metastatic disease. 5. Numerous small bilateral pulmonary nodules, better assessed by CT. 6. 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 Aortic Atherosclerosis (ICD10-I70.0). Electronically Signed   By: Fredricka Jenny M.D.   On: 12/14/2023 21:08   CT ABDOMEN PELVIS W CONTRAST Result Date: 12/14/2023 CLINICAL DATA:  Right upper quadrant abdominal pain EXAM: CT ABDOMEN AND PELVIS WITH CONTRAST TECHNIQUE: Multidetector CT imaging of the abdomen and pelvis was performed using the standard protocol following bolus administration of intravenous contrast. RADIATION DOSE REDUCTION: This exam was performed according to the departmental dose-optimization program which includes automated exposure control, adjustment of the mA and/or kV according to patient size and/or use of iterative reconstruction technique. CONTRAST:  OMNIPAQUE  IOHEXOL  300 MG/ML  SOLN COMPARISON:  Nov 19, 2022 FINDINGS: Lower chest: Several nodules in both lower lobes are identified, measuring between 3 and 4 mm in the right lower lobe and 3 and 5 mm in the lingula, as well as multiple  nodules in the left lower lobe posteromedial segment the largest 9 mm in diameter on image number 8. And in the right lower lobe image number 25 measuring 12 mm in diameter. These nodules were not identified on the prior CT from 2024 there are 4 suspicious for metastatic lung disease. Hepatobiliary: Liver appears normal in size no parenchymal lesions however, gallbladder appears irregular, along the fundus of the gallbladder with a suggestion of capsular thickening measuring 2.4 x 0.9 cm. In addition the gallbladder appears inseparable from the adjacent portion of the liver, the possibility of gallbladder carcinoma should be considered and excluded clinically. Correlation with recent ultrasound demonstrates poor visualization of the gallbladder. The findings are not typical for acute cholecystitis in the CT images. Correlation with MR and MRCP is recommended. Pancreas:  Unremarkable. No pancreatic ductal dilatation or surrounding inflammatory changes. Spleen: Normal in size without focal abnormality. Adrenals/Urinary Tract: Adrenal glands are unremarkable. Kidneys are normal, without renal calculi, focal lesion, or hydronephrosis. Bladder is unremarkable. Stomach/Bowel: There is mesenteric nodular thickening along the mid lower abdominal cavity better seen on images 56-60 and may suggest peritoneal carcinomatosis. Soft tissue nodular masses noted in the left lower quadrant region of the pelvis on image number 65 measuring 3.6 x 2.7 cm this could correlate with an ovarian neoplastic lesion. Again not present on prior examination 2024 Reproductive: Prior hysterectomy Other: No abdominal wall hernia or abnormality. No abdominopelvic ascites. Musculoskeletal: Extensive postsurgical changes of the thoracolumbar spine with intervertebral disc replacements L2-L3 L4 and L5. IMPRESSION: *Multiple pulmonary nodules in the lower lobes bilaterally suspicious for metastatic lung disease. *Irregular appearance of the gallbladder  with a suggestion of capsular thickening along the fundus of the gallbladder. In addition the gallbladder appears inseparable from the adjacent portion of the liver, the possibility of gallbladder carcinoma should be considered and excluded clinically. Correlation with recent ultrasound demonstrates poor visualization of the gallbladder. The findings are not typical for acute cholecystitis in the CT images. Correlation with MR and MRCP is recommended. *Mesenteric nodular thickening along the mid lower abdominal cavity may suggest peritoneal carcinomatosis. *Soft tissue nodular mass in the left lower quadrant region of the pelvis measuring 3.6 x 2.7 cm this could correlate with an ovarian neoplastic lesion. Again not present on prior examination 2024. *Extensive postsurgical changes of the thoracolumbar spine with intervertebral disc replacements L2-L3 L4 and L5. Electronically Signed   By: Fredrich Jefferson M.D.   On: 12/14/2023 15:18   US  Abdomen Limited RUQ (LIVER/GB) Result Date: 12/14/2023 CLINICAL DATA:  [id number] RUQ pain [id number] EXAM: ULTRASOUND ABDOMEN LIMITED RIGHT UPPER QUADRANT COMPARISON:  Nov 19, 2022 FINDINGS: Gallbladder: The gallbladder was not well visualized or evaluated. The sonographer reports right upper quadrant pain in this region during the exam. Common bile duct: Diameter: 4 mm Liver: Normal echogenicity. No focal lesion identified. No intrahepatic biliary ductal dilation. Portal vein is patent on color Doppler imaging with normal direction of blood flow towards the liver. Other: None. IMPRESSION: 1. The gallbladder was not well visualized or evaluated during the exam; however, the sonographer reports right upper quadrant pain in this region. A follow-up CT of the abdomen and pelvis with IV contrast could be considered for further characterization. 2. No intrahepatic or extrahepatic biliary ductal dilation. Electronically Signed   By: Rance Burrows M.D.   On: 12/14/2023 13:05     Labs:  CBC: Recent Labs    12/14/23 1044 12/14/23 1825 12/15/23 0424  WBC 5.2 5.0 5.2  HGB 13.3 12.1 11.9*  HCT 40.6 37.1 36.4  PLT 215 195 187    COAGS: Recent Labs    12/14/23 1825  INR 1.1    BMP: Recent Labs    03/13/23 0916 05/19/23 0909 12/14/23 1044 12/14/23 1825 12/15/23 0424  NA 141 144 139  --  136  K 4.4 4.5 4.1  --  3.3*  CL 106 107* 108  --  106  CO2 23 23 23   --  25  GLUCOSE 106* 102* 119*  --  135*  BUN 10 7* 7*  --  <5*  CALCIUM  8.8 9.4 9.0  --  8.5*  CREATININE 0.78 0.82 0.66 0.63 0.80  GFRNONAA  --   --  >60 >60 >60    LIVER FUNCTION TESTS: Recent Labs    02/11/23  0845 12/14/23 1044  BILITOT 0.5 0.8  AST 49* 19  ALT 47* 16  ALKPHOS 108 68  PROT 6.4 6.3*  ALBUMIN  4.1 3.5    TUMOR MARKERS: No results for input(s): "AFPTM", "CEA", "CA199", "CHROMGRNA" in the last 8760 hours.  Assessment and Plan: 78 y.o. female ex smoker with past medical history significant for arthritis, hypertension, asthma, colon polyps, prior hysterectomy, chronic back pain, COPD, GERD, dermatosviros arcoma and protuverans who was admitted to [facility] yesterday with persistent abdominal  pain.  CT of the abdomen and pelvis revealed:  *Multiple pulmonary nodules in the lower lobes bilaterally suspicious for metastatic lung disease. *Irregular appearance of the gallbladder with a suggestion of capsular thickening along the fundus of the gallbladder. In addition the gallbladder appears inseparable from the adjacent portion of the liver, the possibility of gallbladder carcinoma should be considered and excluded clinically. Correlation with recent ultrasound demonstrates poor visualization of the gallbladder. The findings are not typical for acute cholecystitis in the CT images. Correlation with MR and MRCP is recommended. *Mesenteric nodular thickening along the mid lower abdominal cavity may suggest peritoneal carcinomatosis. *Soft tissue  nodular mass in the left lower quadrant region of the pelvis measuring 3.6 x 2.7 cm this could correlate with an ovarian neoplastic lesion. Again not present on prior examination 2024. *Extensive postsurgical changes of the thoracolumbar spine with intervertebral disc replacements L2-L3 L4 and L5.  MRI of the abdomen revealed:  1. Thickened, contracted gallbladder with a heterogeneous mass arising from the gallbladder fundus, contiguous with the adjacent liver parenchyma of hepatic segment IVB measuring 3.5 x 2.8 cm. 2. Multiple small rim enhancing lesions within the liver parenchyma. 3. Enlarged, necrotic appearing portacaval and porta hepatis lymph nodes. Small, although rim enhancing and abnormal appearing retroperitoneal lymph nodes. 4. Peritoneal stranding and nodularity, incompletely visualized on this examination although present in the left upper quadrant, consistent with peritoneal metastatic disease. 5. Numerous small bilateral pulmonary nodules, better assessed by CT. 6. 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   Aortic Atherosclerosis  CA 125, CEA and CA 19-9 are pending.  Patient is afebrile, WBC normal, hemoglobin 11.9, platelets normal, PT/INR normal, creatinine normal  Request now received from primary care team for image guided peritoneal mass biopsy.  Imaging studies have been reviewed by Dr. Burna Carrier.Risks and benefits of procedure was discussed with the patient/spouse including, but not limited to bleeding, infection, damage to adjacent structures or low yield requiring additional tests.  All of the questions were answered and there is agreement to proceed.  Consent signed and in chart.    Thank you for allowing our service to participate in Janice Pace 's care.  Electronically Signed: D. Honore Lux, PA-C   12/15/2023, 10:07 AM      I spent a total of 40 Minutes    in face to face in  clinical consultation, greater than 50% of which was counseling/coordinating care for image guided peritoneal mass biopsy

## 2023-12-15 NOTE — Consult Note (Signed)
 Palliative Care Consult Note                                  Date: 12/15/2023   Patient Name: Janice Pace  DOB: [birth year]  MRN: [id number]  Age / Sex: 78 y.o., female  PCP: Melene Sportsman Referring Physician: Modena Andes, MD  Reason for Consultation: Establishing goals of care, pain control  HPI/Patient Profile: 78 y.o. female  with past medical history of COPD, GERD, and hyperlipidemia who presented to the ED on 12/14/2023 with complaint of worsening abdominal pain x 2 weeks.  CT abdomen pelvis showed gallbladder mass with pulmonary nodules and peritoneal carcinomatosis.  Findings concerning for metastatic disease with possible gallbladder primary. Patient underwent biopsy of midline omentum on 6/3; results are pending.  Palliative Medicine has been consulted for goals of care discussions and symptom management in the setting of likely metastatic cancer.  Clinical Assessment and Goals of Care:   Extensive chart review has been completed prior to meeting with patient/family including labs, vital signs, imaging, progress/consult notes, orders, medications and available advance directive documents.    I met with *** to discuss diagnosis, prognosis, GOC, and symptom management.  I introduced Palliative Medicine as specialized medical care for people living with serious illness. It focuses on providing relief from the symptoms and stress of a serious illness.   Created space and opportunity for patient and family to express thoughts and feelings regarding current medical situation. Values and goals of care were attempted to be elicited.  Life Review: ***  Functional Status: ***  Patient/Family Understanding of Illness: ***  Discussion: Patient reports feeling overwhelmed by her current medical situation. She verbalizes understanding that she likely has cancer and that it has "spread".  We discussed patient's current illness  and what it means in the larger context of his/her ongoing co-morbidities. Current clinical status was reviewed. Natural disease trajectory of *** was discussed.  Discussed the importance of continued conversation with the medical team regarding overall plan of care and treatment options, ensuring decisions are within the context of the patients values and GOCs.  Questions and concerns addressed. Patient/family encouraged to call with questions or concerns.   Review of Systems  Gastrointestinal:  Positive for abdominal pain and nausea.    Objective:   Primary Diagnoses: Present on Admission:  Abdominal pain  Unilateral primary osteoarthritis, left knee  Mixed hyperlipidemia  GERD (gastroesophageal reflux disease)  COPD with asthma (HCC)  Generalized anxiety disorder  DDD (degenerative disc disease), lumbar  Carcinomatosis peritonei [facility])   Physical Exam Vitals reviewed.  Constitutional:      General: She is not in acute distress.    Appearance: She is ill-appearing.  Pulmonary:     Effort: Pulmonary effort is normal.  Neurological:     Mental Status: She is alert and oriented to person, place, and time.  Psychiatric:        Behavior: Behavior normal.     Vital Signs:  BP 126/77 (BP Location: Left Arm)   Pulse 65   Temp 97.7 F (36.5 C) (Oral)   Resp 16   Wt 77.1 kg   SpO2 96%   BMI 31.09 kg/m   Palliative Assessment/Data: ***     Assessment & Plan:   SUMMARY OF RECOMMENDATIONS   Continue current supportive interventions Goals of care - to be determined pending biopsy results and follow-up with oncology Referral to outpatient  Palliative Care clinic at [facility]  Primary Decision Maker: PATIENT  Existing Vynca/ACP Documentation:   Code Status/Advance Care Planning: DNR - Limited  Symptom Management:  Increased oxycodone  IR to 10-15 mg every 4 hours as needed Dilaudid  0.5-1 mg every 2 hours as needed for severe pain not relieved by  oxycodone  IR  Prognosis:  Unable to determine  Discharge Planning:  {Palliative dispostion:23505}   Discussed with: ***    Thank you for allowing us  to participate in the care of [name]   Time Total: ***  Greater than 50%  of this time was spent counseling and coordinating care related to the above assessment and plan.  Signed by: Maisie Scotland, NP Palliative Medicine Team  Team Phone # [phone]  For individual providers, please see AMION

## 2023-12-15 NOTE — Care Management Obs Status (Signed)
 MEDICARE OBSERVATION STATUS NOTIFICATION   Patient Details  Name: Janice Pace MRN: [id number] Date of Birth: 20-Jun-1946   Medicare Observation Status Notification Given:  Yes    Kathryn Parish, RN 12/15/2023, 11:53 AM

## 2023-12-16 DIAGNOSIS — E66811 Obesity, class 1: Secondary | ICD-10-CM | POA: Insufficient documentation

## 2023-12-16 DIAGNOSIS — J4489 Other specified chronic obstructive pulmonary disease: Secondary | ICD-10-CM

## 2023-12-16 DIAGNOSIS — C786 Secondary malignant neoplasm of retroperitoneum and peritoneum: Secondary | ICD-10-CM | POA: Diagnosis not present

## 2023-12-16 DIAGNOSIS — R1084 Generalized abdominal pain: Secondary | ICD-10-CM | POA: Diagnosis not present

## 2023-12-16 DIAGNOSIS — K219 Gastro-esophageal reflux disease without esophagitis: Secondary | ICD-10-CM

## 2023-12-16 DIAGNOSIS — K828 Other specified diseases of gallbladder: Secondary | ICD-10-CM | POA: Diagnosis not present

## 2023-12-16 DIAGNOSIS — F411 Generalized anxiety disorder: Secondary | ICD-10-CM

## 2023-12-16 LAB — CA 125: Cancer Antigen (CA) 125: 91.3 U/mL — ABNORMAL HIGH (ref 0.0–38.1)

## 2023-12-16 LAB — CBC
HCT: 37.2 % (ref 36.0–46.0)
Hemoglobin: 12.2 g/dL (ref 12.0–15.0)
MCH: 30 pg (ref 26.0–34.0)
MCHC: 32.8 g/dL (ref 30.0–36.0)
MCV: 91.6 fL (ref 80.0–100.0)
Platelets: 187 10*3/uL (ref 150–400)
RBC: 4.06 MIL/uL (ref 3.87–5.11)
RDW: 13.1 % (ref 11.5–15.5)
WBC: 5.4 10*3/uL (ref 4.0–10.5)
nRBC: 0 % (ref 0.0–0.2)

## 2023-12-16 LAB — BASIC METABOLIC PANEL WITH GFR
Anion gap: 6 (ref 5–15)
BUN: 6 mg/dL — ABNORMAL LOW (ref 8–23)
CO2: 26 mmol/L (ref 22–32)
Calcium: 8.9 mg/dL (ref 8.9–10.3)
Chloride: 107 mmol/L (ref 98–111)
Creatinine, Ser: 0.8 mg/dL (ref 0.44–1.00)
GFR, Estimated: >60 mL/min (ref >60)
Glucose, Bld: 112 mg/dL — ABNORMAL HIGH (ref 70–99)
Potassium: 4.6 mmol/L (ref 3.5–5.1)
Sodium: 139 mmol/L (ref 135–145)

## 2023-12-16 LAB — CEA: CEA: 98.7 ng/mL — ABNORMAL HIGH (ref 0.0–4.7)

## 2023-12-16 LAB — MAGNESIUM: Magnesium: 2.1 mg/dL (ref 1.7–2.4)

## 2023-12-16 MED ORDER — OXYCODONE HCL 10 MG PO TABS: 10.0000 mg | ORAL_TABLET | ORAL | 0 refills | Status: DC | PRN

## 2023-12-16 MED ORDER — CHLORDIAZEPOXIDE HCL 10 MG PO CAPS
10.0000 mg | ORAL_CAPSULE | ORAL | 0 refills | Status: AC | PRN
Start: 2023-12-16 — End: 2024-01-15

## 2023-12-16 MED ORDER — ONDANSETRON HCL 4 MG PO TABS: 4.0000 mg | ORAL_TABLET | Freq: Four times a day (QID) | ORAL | 0 refills | Status: DC | PRN

## 2023-12-16 MED ORDER — PANTOPRAZOLE SODIUM 40 MG PO TBEC: 40.0000 mg | DELAYED_RELEASE_TABLET | Freq: Every day | ORAL | 0 refills | Status: DC

## 2023-12-16 NOTE — Assessment & Plan Note (Signed)
 12-16-2023 had CT guided biopsy. Biopsy report not back yet. But possible she had gallbladder cancer. Pt seen by both heme/onc and Gyn/Onc. They will ensure that pt gets appropriate f/u appointment. DC to home with oxycodone  10-15 mg prn. Pt advised to get OTC laxatives. Pt states that she friend brought her "mushroom coffee" that helped her have a BM. Will also refill her anxiety meds.

## 2023-12-16 NOTE — Assessment & Plan Note (Signed)
 12-16-2023 DC to home with protonix 

## 2023-12-16 NOTE — Assessment & Plan Note (Addendum)
 12-16-2023 had CT guided biopsy. Biopsy report not back yet. But possible she had gallbladder cancer. Pt seen by both heme/onc and Gyn/Onc. They will ensure that pt gets appropriate f/u appointment. DC to home with oxycodone  10-15 mg prn. Pt advised to get OTC laxatives. Pt states that she friend brought her "mushroom coffee" that helped her have a BM. Will also refill her anxiety meds.

## 2023-12-16 NOTE — Progress Notes (Signed)
 PROGRESS NOTE    Janice Pace  WUJ:[id number] DOB: [birth year] DOA: 12/14/2023 PCP: Odilia Bennett, PA-C  Subjective: Pt seen and examined. Met with pt and her husband at bedside.  Cleared for discharge today by palliative and gyn/onc. Biopsy report still pending.  Pt not using any IV dilaudid  for pain. Only po oxycodone . Pt wants to go home. Husband agreeable to taking her home.   Hospital Course: HPI: Janice Pace is a 78 y.o. female with medical history significant of essential hypertension, osteoarthritis, COPD/asthma, GERD, depression, anxiety, hiatal hernia, chronic back pain comes to the hospital complains of abdominal pain.  Patient states she started having abdominal pain which has been constant in the epigastric region for about past 2 weeks.  Denies any nausea, vomiting, fever, weight loss, chills and any other complaints.  For the most part she has been able to tolerate p.o.   Colonoscopy 2024 showing multiple polyps which were removed with cold snare Had hysterectomy about 50 years ago Reports that she is not up to date with her GYN screening   In the ER today her routine blood work was unremarkable with CT abdomen pelvis showed multiple pleural nodules, abnormal gallbladder pathology and peritoneal carcinomatosis.  Medical team requested to admit this patient.  Significant Events: Admitted 12/14/2023 for abd pain   Admission Labs: WBC 5.2, HgB 13.3, plt 215 UA spg 1.023, negative Nitrite, negative LE Na 139, K 4.1, CO2 of 23, BUN 7, Scr 0.66, glu 119 TP 6.3, alb 3.5, AST 19, ALT 16, alk phos 68 Lipase 35  Admission Imaging Studies: RUQ U/S The gallbladder was not well visualized or evaluated during the exam; however, the sonographer reports right upper quadrant pain in this region. A follow-up CT of the abdomen and pelvis with IV contrast could be considered for further characterization. 2. No intrahepatic or extrahepatic biliary ductal dilation CT abd/pelvis  *Multiple pulmonary nodules in the lower lobes bilaterally suspicious for metastatic lung disease. *Irregular appearance of the gallbladder with a suggestion of capsular thickening along the fundus of the gallbladder. In addition the gallbladder appears inseparable from the adjacent portion of the liver, the possibility of gallbladder carcinoma should be considered and excluded clinically. Correlation with recent ultrasound demonstrates poor visualization of the gallbladder. The findings are not typical for acute cholecystitis in the CT images. Correlation with MR and MRCP is recommended. *Mesenteric nodular thickening along the mid lower abdominal cavity may suggest peritoneal carcinomatosis. *Soft tissue nodular mass in the left lower quadrant region of the pelvis measuring 3.6 x 2.7 cm this could correlate with an ovarian neoplastic lesion. Again not present on prior examination 2024. *Extensive postsurgical changes of the thoracolumbar spine with intervertebral disc replacements L2-L3 L4 and L5. MRCP Thickened, contracted gallbladder with a heterogeneous mass arising from the gallbladder fundus, contiguous with the adjacent liver parenchyma of hepatic segment IVB measuring 3.5 x 2.8 cm. 2. Multiple small rim enhancing lesions within the liver parenchyma. 3. Enlarged, necrotic appearing portacaval and porta hepatis lymph nodes. Small, although rim enhancing and abnormal appearing retroperitoneal lymph nodes. 4. Peritoneal stranding and nodularity, incompletely visualized on this examination although present in the left upper quadrant, consistent with peritoneal metastatic disease. 5. Numerous small bilateral pulmonary nodules, better assessed by CT. 6. 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  Aortic Atherosclerosis   Significant Labs: CEA 98  Significant Imaging Studies:   Antibiotic Therapy: Anti-infectives (From admission, onward)     None  Procedures: 12-14-2023 CT guided abd mass biopsy  Consultants: IR Heme/onc GYN oncology    Assessment and Plan: * Abdominal pain 12-16-2023 had CT guided biopsy. Biopsy report not back yet. But possible she had gallbladder cancer. Pt seen by both heme/onc and Gyn/Onc. They will ensure that pt gets appropriate f/u appointment. DC to home with oxycodone  10-15 mg prn. Pt advised to get OTC laxatives. Pt states that she friend brought her "mushroom coffee" that helped her have a BM. Will also refill her anxiety meds.  Gallbladder mass 12-16-2023 had CT guided biopsy. Biopsy report not back yet. But possible she had gallbladder cancer. Pt seen by both heme/onc and Gyn/Onc. They will ensure that pt gets appropriate f/u appointment. DC to home with oxycodone  10-15 mg prn. Pt advised to get OTC laxatives. Pt states that she friend brought her "mushroom coffee" that helped her have a BM. Will also refill her anxiety meds.  Carcinomatosis peritonei (HCC) 12-16-2023 had CT guided biopsy. Biopsy report not back yet. But possible she had gallbladder cancer. Pt seen by both heme/onc and Gyn/Onc. They will ensure that pt gets appropriate f/u appointment. DC to home with oxycodone  10-15 mg prn. Pt advised to get OTC laxatives. Pt states that she friend brought her "mushroom coffee" that helped her have a BM. Will also refill her anxiety meds.  DDD (degenerative disc disease), lumbar 12-16-2023 stable.  Generalized anxiety disorder 12-16-2023 will refill her librium .  COPD with asthma (HCC) 12-16-2023 stable.  GERD (gastroesophageal reflux disease) 12-16-2023 DC to home with protonix   Mixed hyperlipidemia 12-16-2023 stable.   DVT prophylaxis: enoxaparin (LOVENOX) injection 40 mg Start: 12/16/23 1000    Code Status: Limited: Do not attempt resuscitation (DNR) -DNR-LIMITED -Do Not Intubate/DNI  Family Communication: discussed with pt and husband at bedside Disposition Plan: return  home Reason for continuing need for hospitalization: stable for DC.  Objective: Vitals:   12/15/23 1932 12/15/23 2106 12/16/23 0830 12/16/23 1411  BP: (!) 144/63   111/61  Pulse: 66   66  Resp: 18   16  Temp: 98.4 F (36.9 C)   97.8 F (36.6 C)  TempSrc:      SpO2: 98% 100% 96% 97%  Weight:        Intake/Output Summary (Last 24 hours) at 12/16/2023 1417 Last data filed at 12/15/2023 1511 Gross per 24 hour  Intake --  Output 750 ml  Net -750 ml   Filed Weights   12/15/23 0450  Weight: 77.1 kg    Examination:  Physical Exam Vitals and nursing note reviewed.  Constitutional:      General: She is not in acute distress.    Appearance: She is obese. She is not toxic-appearing or diaphoretic.  HENT:     Head: Normocephalic and atraumatic.  Eyes:     General: No scleral icterus. Cardiovascular:     Rate and Rhythm: Normal rate and regular rhythm.     Pulses: Normal pulses.     Heart sounds: Normal heart sounds.  Pulmonary:     Effort: Pulmonary effort is normal. No respiratory distress.     Breath sounds: No wheezing.  Abdominal:     Tenderness: There is generalized abdominal tenderness.     Comments: No rebound or guarding  Musculoskeletal:     Right lower leg: No edema.     Left lower leg: No edema.  Skin:    General: Skin is warm and dry.     Capillary Refill: Capillary refill takes less than  2 seconds.  Neurological:     Mental Status: She is alert and oriented to person, place, and time.     Data Reviewed: I have personally reviewed following labs and imaging studies  CBC: Recent Labs  Lab 12/14/23 1044 12/14/23 1825 12/15/23 0424 12/16/23 0426  WBC 5.2 5.0 5.2 5.4  HGB 13.3 12.1 11.9* 12.2  HCT 40.6 37.1 36.4 37.2  MCV 90.2 89.2 91.0 91.6  PLT 215 195 187 187   Basic Metabolic Panel: Recent Labs  Lab 12/14/23 1044 12/14/23 1825 12/15/23 0424 12/16/23 0426  NA 139  --  136 139  K 4.1  --  3.3* 4.6  CL 108  --  106 107  CO2 23  --  25 26   GLUCOSE 119*  --  135* 112*  BUN 7*  --  <5* 6*  CREATININE 0.66 0.63 0.80 0.80  CALCIUM  9.0  --  8.5* 8.9  MG  --   --  2.0 2.1   GFR: Estimated Creatinine Clearance: 56.6 mL/min (by C-G formula based on SCr of 0.8 mg/dL). Liver Function Tests: Recent Labs  Lab 12/14/23 1044  AST 19  ALT 16  ALKPHOS 68  BILITOT 0.8  PROT 6.3*  ALBUMIN  3.5   Recent Labs  Lab 12/14/23 1044  LIPASE 35    Coagulation Profile: Recent Labs  Lab 12/14/23 1825  INR 1.1   BNP (last 3 results) Recent Labs    03/13/23 0916  BNP 106.1*    Radiology Studies: CT ABDOMINAL MASS BIOPSY Result Date: 12/15/2023 INDICATION: Pelvic mass EXAM: CT-guided core needle biopsy pelvic mass TECHNIQUE: Multidetector CT imaging of the pelvis was performed following the standard protocol without IV contrast. RADIATION DOSE REDUCTION: This exam was performed according to the departmental dose-optimization program which includes automated exposure control, adjustment of the mA and/or kV according to patient size and/or use of iterative reconstruction technique. MEDICATIONS: None. ANESTHESIA/SEDATION: Moderate (conscious) sedation was employed during this procedure. A total of Versed  1 mg and Fentanyl  100 mcg was administered intravenously by the radiology nurse. Total intra-service moderate Sedation Time: 14 minutes. The patient's level of consciousness and vital signs were monitored continuously by radiology nursing throughout the procedure under my direct supervision. COMPLICATIONS: None immediate. PROCEDURE: Informed written consent was obtained from the patient after a thorough discussion of the procedural risks, benefits and alternatives. All questions were addressed. Maximal Sterile Barrier Technique was utilized including caps, mask, sterile gowns, sterile gloves, sterile drape, hand hygiene and skin antiseptic. A timeout was performed prior to the initiation of the procedure. The patient was placed on the CT gantry  in a supine position. Radiopaque markers were placed on the patient's skin near the target lesion. The the patient's pelvis was then imaged, measurements were obtained, and the patient's skin was marked for the best trajectory for percutaneous biopsy. Local anesthesia was achieved with 1% lidocaine  by infiltrative subcutaneous tissue to the level of the abdominal rectus with 1% lidocaine . A stab incision was then created and the biopsy trocar was advanced to the level of the rectus. Further imaging was obtained. The needle was then removed and the biopsy BioPince needle was advanced through the cannula and a biopsy sample was obtained. Further imaging demonstrated no significant hemorrhage. A second core needle biopsy was obtained, but minimal material returned. All needles were removed the patient. Sterile dressing was applied. IMPRESSION: Successful core needle biopsy of an omental mass in the midline in the patient's pelvis. Electronically Signed   By:  Susan Ensign   On: 12/15/2023 12:17   MR ABDOMEN MRCP W WO CONTAST Result Date: 12/14/2023 CLINICAL DATA:  Gallbladder malignancy suspected, gallbladder mass, pulmonary metastases, and peritoneal metastatic disease identified by prior CT EXAM: MRI ABDOMEN WITHOUT AND WITH CONTRAST (INCLUDING MRCP) TECHNIQUE: Multiplanar multisequence MR imaging of the abdomen was performed both before and after the administration of intravenous contrast. Heavily T2-weighted images of the biliary and pancreatic ducts were obtained, and three-dimensional MRCP images were rendered by post processing. CONTRAST:  7mL GADAVIST GADOBUTROL 1 MMOL/ML IV SOLN COMPARISON:  CT abdomen pelvis, 12/14/2023 FINDINGS: Lower chest: No acute abnormality. Numerous small bilateral pulmonary nodules, better assessed by CT (series 14, image 5). Hepatobiliary: Thickened, contracted gallbladder with a heterogeneous mass arising from the gallbladder fundus, contiguous with the adjacent liver  parenchyma of hepatic segment IVB measuring 3.5 x 2.8 cm (series 14, image 37). Multiple small rim enhancing lesions within the liver parenchyma, for example in anterior hepatic segment II measuring 1.2 x 1.1 cm (series 14, image 19). Pancreas: Unremarkable. No pancreatic ductal dilatation or surrounding inflammatory changes. Spleen: Normal in size without significant abnormality. Adrenals/Urinary Tract: Unchanged, definitively benign macroscopic fat containing adrenal adenomata requiring no specific further follow-up or characterization. Kidneys are normal, without obvious renal calculi, solid lesion, or hydronephrosis. Stomach/Bowel: Stomach is within normal limits. No evidence of bowel wall thickening, distention, or inflammatory changes. Vascular/Lymphatic: Aortic atherosclerosis. Enlarged, necrotic appearing portacaval and porta hepatis lymph nodes measuring up 2 2.9 x 1.6 cm (series 14, image 31). Small, although rim enhancing and abnormal appearing retroperitoneal lymph nodes (series 14, image 50). Other: No abdominal wall hernia or abnormality. No ascites. Peritoneal stranding and nodularity, incompletely visualized on this examination although present in the left upper quadrant (series 14, image 29). Musculoskeletal: No acute or significant osseous findings. IMPRESSION: 1. Thickened, contracted gallbladder with a heterogeneous mass arising from the gallbladder fundus, contiguous with the adjacent liver parenchyma of hepatic segment IVB measuring 3.5 x 2.8 cm. 2. Multiple small rim enhancing lesions within the liver parenchyma. 3. Enlarged, necrotic appearing portacaval and porta hepatis lymph nodes. Small, although rim enhancing and abnormal appearing retroperitoneal lymph nodes. 4. Peritoneal stranding and nodularity, incompletely visualized on this examination although present in the left upper quadrant, consistent with peritoneal metastatic disease. 5. Numerous small bilateral pulmonary nodules, better  assessed by CT. 6. 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 Aortic Atherosclerosis (ICD10-I70.0). Electronically Signed   By: Fredricka Jenny M.D.   On: 12/14/2023 21:08   MR 3D Recon At Scanner Result Date: 12/14/2023 CLINICAL DATA:  Gallbladder malignancy suspected, gallbladder mass, pulmonary metastases, and peritoneal metastatic disease identified by prior CT EXAM: MRI ABDOMEN WITHOUT AND WITH CONTRAST (INCLUDING MRCP) TECHNIQUE: Multiplanar multisequence MR imaging of the abdomen was performed both before and after the administration of intravenous contrast. Heavily T2-weighted images of the biliary and pancreatic ducts were obtained, and three-dimensional MRCP images were rendered by post processing. CONTRAST:  7mL GADAVIST GADOBUTROL 1 MMOL/ML IV SOLN COMPARISON:  CT abdomen pelvis, 12/14/2023 FINDINGS: Lower chest: No acute abnormality. Numerous small bilateral pulmonary nodules, better assessed by CT (series 14, image 5). Hepatobiliary: Thickened, contracted gallbladder with a heterogeneous mass arising from the gallbladder fundus, contiguous with the adjacent liver parenchyma of hepatic segment IVB measuring 3.5 x 2.8 cm (series 14, image 37). Multiple small rim enhancing lesions within the liver parenchyma, for example in anterior hepatic segment II measuring 1.2 x 1.1 cm (series 14, image 19).  Pancreas: Unremarkable. No pancreatic ductal dilatation or surrounding inflammatory changes. Spleen: Normal in size without significant abnormality. Adrenals/Urinary Tract: Unchanged, definitively benign macroscopic fat containing adrenal adenomata requiring no specific further follow-up or characterization. Kidneys are normal, without obvious renal calculi, solid lesion, or hydronephrosis. Stomach/Bowel: Stomach is within normal limits. No evidence of bowel wall thickening, distention, or inflammatory changes. Vascular/Lymphatic: Aortic atherosclerosis.  Enlarged, necrotic appearing portacaval and porta hepatis lymph nodes measuring up 2 2.9 x 1.6 cm (series 14, image 31). Small, although rim enhancing and abnormal appearing retroperitoneal lymph nodes (series 14, image 50). Other: No abdominal wall hernia or abnormality. No ascites. Peritoneal stranding and nodularity, incompletely visualized on this examination although present in the left upper quadrant (series 14, image 29). Musculoskeletal: No acute or significant osseous findings. IMPRESSION: 1. Thickened, contracted gallbladder with a heterogeneous mass arising from the gallbladder fundus, contiguous with the adjacent liver parenchyma of hepatic segment IVB measuring 3.5 x 2.8 cm. 2. Multiple small rim enhancing lesions within the liver parenchyma. 3. Enlarged, necrotic appearing portacaval and porta hepatis lymph nodes. Small, although rim enhancing and abnormal appearing retroperitoneal lymph nodes. 4. Peritoneal stranding and nodularity, incompletely visualized on this examination although present in the left upper quadrant, consistent with peritoneal metastatic disease. 5. Numerous small bilateral pulmonary nodules, better assessed by CT. 6. 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 Aortic Atherosclerosis (ICD10-I70.0). Electronically Signed   By: Fredricka Jenny M.D.   On: 12/14/2023 21:08    Scheduled Meds:  arformoterol   15 mcg Nebulization BID   buPROPion  ER  200 mg Oral BID   enoxaparin (LOVENOX) injection  40 mg Subcutaneous Daily   metoprolol  succinate  50 mg Oral Daily   pantoprazole   40 mg Oral QAC breakfast   revefenacin   175 mcg Nebulization Daily   rosuvastatin   10 mg Oral Daily   senna-docusate  2 tablet Oral QHS   Continuous Infusions:   LOS: 1 day   Time spent: 55 minutes  Unk Garb, DO  [facility]  12/16/2023, 2:17 PM

## 2023-12-16 NOTE — Assessment & Plan Note (Signed)
 12-16-2023 stable

## 2023-12-16 NOTE — Assessment & Plan Note (Signed)
 12-16-2023 will refill her librium .

## 2023-12-16 NOTE — Hospital Course (Signed)
 HPI: Janice Pace is a 78 y.o. female with medical history significant of essential hypertension, osteoarthritis, COPD/asthma, GERD, depression, anxiety, hiatal hernia, chronic back pain comes to the hospital complains of abdominal pain.  Patient states she started having abdominal pain which has been constant in the epigastric region for about past 2 weeks.  Denies any nausea, vomiting, fever, weight loss, chills and any other complaints.  For the most part she has been able to tolerate p.o.   Colonoscopy 2024 showing multiple polyps which were removed with cold snare Had hysterectomy about 50 years ago Reports that she is not up to date with her GYN screening   In the ER today her routine blood work was unremarkable with CT abdomen pelvis showed multiple pleural nodules, abnormal gallbladder pathology and peritoneal carcinomatosis.  Medical team requested to admit this patient.  Significant Events: Admitted 12/14/2023 for abd pain   Admission Labs: WBC 5.2, HgB 13.3, plt 215 UA spg 1.023, negative Nitrite, negative LE Na 139, K 4.1, CO2 of 23, BUN 7, Scr 0.66, glu 119 TP 6.3, alb 3.5, AST 19, ALT 16, alk phos 68 Lipase 35  Admission Imaging Studies: RUQ U/S The gallbladder was not well visualized or evaluated during the exam; however, the sonographer reports right upper quadrant pain in this region. A follow-up CT of the abdomen and pelvis with IV contrast could be considered for further characterization. 2. No intrahepatic or extrahepatic biliary ductal dilation CT abd/pelvis *Multiple pulmonary nodules in the lower lobes bilaterally suspicious for metastatic lung disease. *Irregular appearance of the gallbladder with a suggestion of capsular thickening along the fundus of the gallbladder. In addition the gallbladder appears inseparable from the adjacent portion of the liver, the possibility of gallbladder carcinoma should be considered and excluded clinically. Correlation with recent  ultrasound demonstrates poor visualization of the gallbladder. The findings are not typical for acute cholecystitis in the CT images. Correlation with MR and MRCP is recommended. *Mesenteric nodular thickening along the mid lower abdominal cavity may suggest peritoneal carcinomatosis. *Soft tissue nodular mass in the left lower quadrant region of the pelvis measuring 3.6 x 2.7 cm this could correlate with an ovarian neoplastic lesion. Again not present on prior examination 2024. *Extensive postsurgical changes of the thoracolumbar spine with intervertebral disc replacements L2-L3 L4 and L5. MRCP Thickened, contracted gallbladder with a heterogeneous mass arising from the gallbladder fundus, contiguous with the adjacent liver parenchyma of hepatic segment IVB measuring 3.5 x 2.8 cm. 2. Multiple small rim enhancing lesions within the liver parenchyma. 3. Enlarged, necrotic appearing portacaval and porta hepatis lymph nodes. Small, although rim enhancing and abnormal appearing retroperitoneal lymph nodes. 4. Peritoneal stranding and nodularity, incompletely visualized on this examination although present in the left upper quadrant, consistent with peritoneal metastatic disease. 5. Numerous small bilateral pulmonary nodules, better assessed by CT. 6. 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  Aortic Atherosclerosis   Significant Labs: CEA 98  Significant Imaging Studies:   Antibiotic Therapy: Anti-infectives (From admission, onward)    None       Procedures: 12-14-2023 CT guided abd mass biopsy  Consultants: IR Heme/onc GYN oncology

## 2023-12-16 NOTE — Plan of Care (Signed)
  Problem: Education: Goal: Knowledge of General Education information will improve Description: Including pain rating scale, medication(s)/side effects and non-pharmacologic comfort measures Outcome: Progressing   Problem: Clinical Measurements: Goal: Will remain free from infection Outcome: Progressing   Problem: Nutrition: Goal: Adequate nutrition will be maintained Outcome: Progressing   Problem: Pain Managment: Goal: General experience of comfort will improve and/or be controlled Outcome: Progressing   Problem: Safety: Goal: Ability to remain free from injury will improve Outcome: Progressing   Problem: Skin Integrity: Goal: Risk for impaired skin integrity will decrease Outcome: Progressing

## 2023-12-16 NOTE — Assessment & Plan Note (Signed)
Body mass index is 31.09 kg/m². °

## 2023-12-16 NOTE — Progress Notes (Signed)
 Palliative Medicine Progress Note   Patient Name: Janice Pace       Date: 12/16/2023 DOB: [birth year]  Age: 78 y.o. MRN#: [id number] Attending Physician: Unk Garb, DO Primary Care Physician: Melene Sportsman Admit Date: 12/14/2023  Reason for Consultation/Follow-up: {Reason for Consult:23484}  HPI/Patient Profile: 78 y.o. female  with past medical history of COPD, GERD, and hyperlipidemia who presented to the ED on 12/14/2023 with complaint of worsening abdominal pain x 2 weeks.  CT abdomen pelvis showed gallbladder mass with pulmonary nodules and peritoneal carcinomatosis.  Findings concerning for metastatic disease with possible gallbladder primary. Patient underwent biopsy of midline omentum on 6/3; results are pending.   Palliative Medicine has been consulted for goals of care discussions and symptom management in the setting of likely metastatic cancer.  Subjective: ***  Objective:  Physical Exam          Vital Signs: BP (!) 144/63 (BP Location: Left Arm)   Pulse 66   Temp 98.4 F (36.9 C)   Resp 18   Wt 77.1 kg   SpO2 96%   BMI 31.09 kg/m  SpO2: SpO2: 96 % O2 Device: O2 Device: Room Air O2 Flow Rate: O2 Flow Rate (L/min): 2 L/min  Intake/output summary:  Intake/Output Summary (Last 24 hours) at 12/16/2023 1317 Last data filed at 12/15/2023 1511 Gross per 24 hour  Intake --  Output 750 ml  Net -750 ml    LBM: Last BM Date : 12/13/23     Palliative Assessment/Data: ***     Palliative Medicine Assessment & Plan   Assessment: Principal Problem:   Abdominal pain Active Problems:   Unilateral primary osteoarthritis, left knee   Mixed hyperlipidemia   GERD (gastroesophageal reflux disease)   COPD with asthma (HCC)   Generalized anxiety disorder   DDD  (degenerative disc disease), lumbar   Carcinomatosis peritonei (HCC)   Gallbladder mass    Recommendations/Plan: ***  Goals of Care and Additional Recommendations: Limitations on Scope of Treatment: {Recommended Scope and Preferences:21019}  Code Status:   Prognosis:  {Palliative Care Prognosis:23504}  Discharge Planning: {Palliative dispostion:23505}  Care plan was discussed with ***  Thank you for allowing the Palliative Medicine Team to assist in the care of this patient.   ***   Wynetta Heckle, NP   Please contact Palliative  Medicine Team phone at [phone] for questions and concerns.  For individual providers, please see [facility].

## 2023-12-16 NOTE — Discharge Summary (Addendum)
 [facility] Phys[facility] Discharge Summary   Patient name: Janice Pace  Admit date:     12/14/2023  Discharge date: 12/16/2023  Attending Physician: Modena Andes [[id number]]  Discharge Physician: Unk Garb   PCP: Odilia Bennett, PA-C  Admitted From: Home  Disposition:  Home  Recommendations for Outpatient Follow-up:  Follow up with PCP in 1-2 weeks F/u with either gyn/onc or heme/onc Please follow up on the following pending results: omental biopsy  Home Health:No Equipment/Devices: None    Discharge Condition:Stable CODE STATUS:FULL Diet recommendation: Heart Healthy/Diabetic Fluid Restriction: None  Hospital Summary: HPI: Janice Pace is a 78 y.o. female with medical history significant of essential hypertension, osteoarthritis, COPD/asthma, GERD, depression, anxiety, hiatal hernia, chronic back pain comes to the hospital complains of abdominal pain.  Patient states she started having abdominal pain which has been constant in the epigastric region for about past 2 weeks.  Denies any nausea, vomiting, fever, weight loss, chills and any other complaints.  For the most part she has been able to tolerate p.o.   Colonoscopy 2024 showing multiple polyps which were removed with cold snare Had hysterectomy about 50 years ago Reports that she is not up to date with her GYN screening   In the ER today her routine blood work was unremarkable with CT abdomen pelvis showed multiple pleural nodules, abnormal gallbladder pathology and peritoneal carcinomatosis.  Medical team requested to admit this patient.  Significant Events: Admitted 12/14/2023 for abd pain   Admission Labs: WBC 5.2, HgB 13.3, plt 215 UA spg 1.023, negative Nitrite, negative LE Na 139, K 4.1, CO2 of 23, BUN 7, Scr 0.66, glu 119 TP 6.3, alb 3.5, AST 19, ALT 16, alk phos 68 Lipase 35  Admission Imaging Studies: RUQ U/S The gallbladder was not well visualized or evaluated during the exam; however, the  sonographer reports right upper quadrant pain in this region. A follow-up CT of the abdomen and pelvis with IV contrast could be considered for further characterization. 2. No intrahepatic or extrahepatic biliary ductal dilation CT abd/pelvis *Multiple pulmonary nodules in the lower lobes bilaterally suspicious for metastatic lung disease. *Irregular appearance of the gallbladder with a suggestion of capsular thickening along the fundus of the gallbladder. In addition the gallbladder appears inseparable from the adjacent portion of the liver, the possibility of gallbladder carcinoma should be considered and excluded clinically. Correlation with recent ultrasound demonstrates poor visualization of the gallbladder. The findings are not typical for acute cholecystitis in the CT images. Correlation with MR and MRCP is recommended. *Mesenteric nodular thickening along the mid lower abdominal cavity may suggest peritoneal carcinomatosis. *Soft tissue nodular mass in the left lower quadrant region of the pelvis measuring 3.6 x 2.7 cm this could correlate with an ovarian neoplastic lesion. Again not present on prior examination 2024. *Extensive postsurgical changes of the thoracolumbar spine with intervertebral disc replacements L2-L3 L4 and L5. MRCP Thickened, contracted gallbladder with a heterogeneous mass arising from the gallbladder fundus, contiguous with the adjacent liver parenchyma of hepatic segment IVB measuring 3.5 x 2.8 cm. 2. Multiple small rim enhancing lesions within the liver parenchyma. 3. Enlarged, necrotic appearing portacaval and porta hepatis lymph nodes. Small, although rim enhancing and abnormal appearing retroperitoneal lymph nodes. 4. Peritoneal stranding and nodularity, incompletely visualized on this examination although present in the left upper quadrant, consistent with peritoneal metastatic disease. 5. Numerous small bilateral pulmonary nodules, better assessed by CT. 6. Constellation of  findings is consistent with gallbladder malignancy and associated metastatic  disease.  Aortic Atherosclerosis   Significant Labs: CEA 98  Significant Imaging Studies:   Antibiotic Therapy: Anti-infectives (From admission, onward)    None       Procedures: 12-14-2023 CT guided abd mass biopsy  Consultants: IR Heme/onc GYN oncology   Hospital Course by Problem: * Abdominal pain 12-16-2023 had CT guided biopsy. Biopsy report not back yet. But possible she had gallbladder cancer. Pt seen by both heme/onc and Gyn/Onc. They will ensure that pt gets appropriate f/u appointment. DC to home with oxycodone  10-15 mg prn. Pt advised to get OTC laxatives. Pt states that she friend brought her "mushroom coffee" that helped her have a BM. Will also refill her anxiety meds.  Gallbladder mass 12-16-2023 had CT guided biopsy. Biopsy report not back yet. But possible she had gallbladder cancer. Pt seen by both heme/onc and Gyn/Onc. They will ensure that pt gets appropriate f/u appointment. DC to home with oxycodone  10-15 mg prn. Pt advised to get OTC laxatives. Pt states that she friend brought her "mushroom coffee" that helped her have a BM. Will also refill her anxiety meds.  Carcinomatosis peritonei (HCC) 12-16-2023 had CT guided biopsy. Biopsy report not back yet. But possible she had gallbladder cancer. Pt seen by both heme/onc and Gyn/Onc. They will ensure that pt gets appropriate f/u appointment. DC to home with oxycodone  10-15 mg prn. Pt advised to get OTC laxatives. Pt states that she friend brought her "mushroom coffee" that helped her have a BM. Will also refill her anxiety meds.  Obesity, Class I, BMI 30-34.9 Body mass index is 31.09 kg/m.   DDD (degenerative disc disease), lumbar 12-16-2023 stable.  Generalized anxiety disorder 12-16-2023 will refill her librium .  COPD with asthma (HCC) 12-16-2023 stable.  GERD (gastroesophageal reflux disease) 12-16-2023 DC to home with  protonix   Mixed hyperlipidemia 12-16-2023 stable.    Discharge Diagnoses:  Principal Problem:   Abdominal pain Active Problems:   Carcinomatosis peritonei (HCC)   Gallbladder mass   Mixed hyperlipidemia   GERD (gastroesophageal reflux disease)   COPD with asthma (HCC)   Generalized anxiety disorder   DDD (degenerative disc disease), lumbar   Obesity, Class I, BMI 30-34.9   Discharge Instructions  Discharge Instructions     Amb Referral to Palliative Care   Complete by: As directed    Call MD for:  difficulty breathing, headache or visual disturbances   Complete by: As directed    Call MD for:  extreme fatigue   Complete by: As directed    Call MD for:  hives   Complete by: As directed    Call MD for:  persistant dizziness or light-headedness   Complete by: As directed    Call MD for:  persistant nausea and vomiting   Complete by: As directed    Call MD for:  redness, tenderness, or signs of infection (pain, swelling, redness, odor or green/yellow discharge around incision site)   Complete by: As directed    Call MD for:  severe uncontrolled pain   Complete by: As directed    Call MD for:  temperature >100.4   Complete by: As directed    Diet - low sodium heart healthy   Complete by: As directed    Discharge instructions   Complete by: As directed    1. Follow up with your primary care provider in 1-2 weeks following discharge from hospital. 2. Oncology office will call you with appointment once your biopsy results are back.   Increase activity  slowly   Complete by: As directed    No wound care   Complete by: As directed       Allergies as of 12/16/2023       Reactions   Sulfa Antibiotics Hives, Itching   Forteo [parathyroid Hormone (recomb)] Other (See Comments)   "Too many side effects"   Atorvastatin Other (See Comments)   Muscle aches   Metformin Hcl Other (See Comments)   Joint pains and muscle aches        Medication List     PAUSE taking  these medications    naltrexone  50 MG tablet Wait to take this until your doctor or other care provider tells you to start again. Commonly known as: DEPADE Take 0.5 tablets (25 mg total) by mouth daily.   spironolactone  25 MG tablet Wait to take this until your doctor or other care provider tells you to start again. Commonly known as: ALDACTONE  TAKE 1 TABLET BY MOUTH ONCE DAILY IN THE MORNING       STOP taking these medications    alendronate  70 MG tablet Commonly known as: FOSAMAX    ciprofloxacin  500 MG tablet Commonly known as: CIPRO        TAKE these medications    albuterol  108 (90 Base) MCG/ACT inhaler Commonly known as: VENTOLIN  HFA Inhale 1-2 puffs into the lungs every 6 (six) hours as needed for wheezing or shortness of breath.   aspirin  EC 81 MG tablet Take 81 mg by mouth daily.   budesonide  0.5 MG/2ML nebulizer solution Commonly known as: PULMICORT  Generic for Pulmicort . Inhale one vial in nebulizer twice a day. Rinse mouth after use. What changed: See the new instructions.   buPROPion  ER 100 MG 12 hr tablet Commonly known as: Wellbutrin  SR Take 2 tablets (200 mg total) by mouth 2 (two) times daily.   chlordiazePOXIDE  10 MG capsule Commonly known as: LIBRIUM  Take 1 capsule (10 mg total) by mouth as needed for anxiety (max of twice daily).   docusate sodium  100 MG capsule Commonly known as: COLACE Take 500 mg by mouth at bedtime.   formoterol  20 MCG/2ML nebulizer solution Commonly known as: PERFOROMIST  Substituted for: Perforomist  Solution Inhale one vial in nebulizer twice a day. What changed: See the new instructions.   metoprolol  succinate 50 MG 24 hr tablet Commonly known as: TOPROL -XL TAKE 1 TABLET BY MOUTH ONCE DAILY. TAKE WITH OR IMMEDIATELY FOLLOWING A MEAL What changed: See the new instructions.   NON FORMULARY Take 8 oz by mouth See admin instructions. Mushroom coffee: Drink 8-10 ounces by mouth once a day as needed to aid bowel  movements   omeprazole  20 MG capsule Commonly known as: PRILOSEC Take 1 capsule (20 mg total) by mouth daily as needed (acid reflux).   ondansetron  4 MG tablet Commonly known as: ZOFRAN  Take 1 tablet (4 mg total) by mouth every 6 (six) hours as needed for nausea.   Oxycodone  HCl 10 MG Tabs Take 1-1.5 tablets (10-15 mg total) by mouth every 4 (four) hours as needed for moderate pain (pain score 4-6) or severe pain (pain score 7-10).   pantoprazole  40 MG tablet Commonly known as: PROTONIX  Take 1 tablet (40 mg total) by mouth daily before breakfast. Start taking on: December 17, 2023   revefenacin  175 MCG/3ML nebulizer solution Commonly known as: YUPELRI  Take 3 mLs (175 mcg total) by nebulization daily.   rosuvastatin  10 MG tablet Commonly known as: Crestor  Take 1 tablet (10 mg total) by mouth daily.  Follow-up Information     Sonja [place], MD. Schedule an appointment as soon as possible for a visit in 2 week(s).   Specialties: Hematology, Oncology Contact information: [address] [place] [address] [phone]                Allergies  Allergen Reactions   Sulfa Antibiotics Hives and Itching   Forteo [Parathyroid Hormone (Recomb)] Other (See Comments)    "Too many side effects"   Atorvastatin Other (See Comments)    Muscle aches   Metformin Hcl Other (See Comments)    Joint pains and muscle aches    Discharge Exam: Vitals:   12/16/23 0830 12/16/23 1411  BP:  111/61  Pulse:  66  Resp:  16  Temp:  97.8 F (36.6 C)  SpO2: 96% 97%    Physical Exam Vitals and nursing note reviewed.  Constitutional:      General: She is not in acute distress.    Appearance: She is obese. She is not toxic-appearing or diaphoretic.  HENT:     Head: Normocephalic and atraumatic.  Eyes:     General: No scleral icterus. Cardiovascular:     Rate and Rhythm: Normal rate and regular rhythm.     Pulses: Normal pulses.     Heart sounds: Normal heart sounds.   Pulmonary:     Effort: Pulmonary effort is normal. No respiratory distress.     Breath sounds: No wheezing.  Abdominal:     Tenderness: There is generalized abdominal tenderness.     Comments: No rebound or guarding  Musculoskeletal:     Right lower leg: No edema.     Left lower leg: No edema.  Skin:    General: Skin is warm and dry.     Capillary Refill: Capillary refill takes less than 2 seconds.  Neurological:     Mental Status: She is alert and oriented to person, place, and time.     The results of significant diagnostics from this hospitalization (including imaging, microbiology, ancillary and laboratory) are listed below for reference.    Labs: BNP (last 3 results) Recent Labs    03/13/23 0916  BNP 106.1*   Basic Metabolic Panel: Recent Labs  Lab 12/14/23 1044 12/14/23 1825 12/15/23 0424 12/16/23 0426  NA 139  --  136 139  K 4.1  --  3.3* 4.6  CL 108  --  106 107  CO2 23  --  25 26  GLUCOSE 119*  --  135* 112*  BUN 7*  --  <5* 6*  CREATININE 0.66 0.63 0.80 0.80  CALCIUM  9.0  --  8.5* 8.9  MG  --   --  2.0 2.1   Liver Function Tests: Recent Labs  Lab 12/14/23 1044  AST 19  ALT 16  ALKPHOS 68  BILITOT 0.8  PROT 6.3*  ALBUMIN  3.5   Recent Labs  Lab 12/14/23 1044  LIPASE 35   CBC: Recent Labs  Lab 12/14/23 1044 12/14/23 1825 12/15/23 0424 12/16/23 0426  WBC 5.2 5.0 5.2 5.4  HGB 13.3 12.1 11.9* 12.2  HCT 40.6 37.1 36.4 37.2  MCV 90.2 89.2 91.0 91.6  PLT 215 195 187 187   Urinalysis    Component Value Date/Time   COLORURINE YELLOW 12/14/2023 1112   APPEARANCEUR HAZY (A) 12/14/2023 1112   LABSPEC 1.023 12/14/2023 1112   PHURINE 5.0 12/14/2023 1112   GLUCOSEU NEGATIVE 12/14/2023 1112   HGBUR NEGATIVE 12/14/2023 1112   BILIRUBINUR NEGATIVE 12/14/2023 1112  BILIRUBINUR small (A) 09/23/2023 1109   BILIRUBINUR negative 08/26/2023 0944   KETONESUR NEGATIVE 12/14/2023 1112   PROTEINUR NEGATIVE 12/14/2023 1112   UROBILINOGEN 2.0 (A)  09/23/2023 1109   NITRITE NEGATIVE 12/14/2023 1112   LEUKOCYTESUR NEGATIVE 12/14/2023 1112   Sepsis Labs Recent Labs  Lab 12/14/23 1044 12/14/23 1825 12/15/23 0424 12/16/23 0426  WBC 5.2 5.0 5.2 5.4    Procedures/Studies: CT ABDOMINAL MASS BIOPSY Result Date: 12/15/2023 INDICATION: Pelvic mass EXAM: CT-guided core needle biopsy pelvic mass TECHNIQUE: Multidetector CT imaging of the pelvis was performed following the standard protocol without IV contrast. RADIATION DOSE REDUCTION: This exam was performed according to the departmental dose-optimization program which includes automated exposure control, adjustment of the mA and/or kV according to patient size and/or use of iterative reconstruction technique. MEDICATIONS: None. ANESTHESIA/SEDATION: Moderate (conscious) sedation was employed during this procedure. A total of Versed  1 mg and Fentanyl  100 mcg was administered intravenously by the radiology nurse. Total intra-service moderate Sedation Time: 14 minutes. The patient's level of consciousness and vital signs were monitored continuously by radiology nursing throughout the procedure under my direct supervision. COMPLICATIONS: None immediate. PROCEDURE: Informed written consent was obtained from the patient after a thorough discussion of the procedural risks, benefits and alternatives. All questions were addressed. Maximal Sterile Barrier Technique was utilized including caps, mask, sterile gowns, sterile gloves, sterile drape, hand hygiene and skin antiseptic. A timeout was performed prior to the initiation of the procedure. The patient was placed on the CT gantry in a supine position. Radiopaque markers were placed on the patient's skin near the target lesion. The the patient's pelvis was then imaged, measurements were obtained, and the patient's skin was marked for the best trajectory for percutaneous biopsy. Local anesthesia was achieved with 1% lidocaine  by infiltrative subcutaneous tissue to  the level of the abdominal rectus with 1% lidocaine . A stab incision was then created and the biopsy trocar was advanced to the level of the rectus. Further imaging was obtained. The needle was then removed and the biopsy BioPince needle was advanced through the cannula and a biopsy sample was obtained. Further imaging demonstrated no significant hemorrhage. A second core needle biopsy was obtained, but minimal material returned. All needles were removed the patient. Sterile dressing was applied. IMPRESSION: Successful core needle biopsy of an omental mass in the midline in the patient's pelvis. 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 Heavily T2-weighted images of the biliary and pancreatic ducts were obtained, and three-dimensional MRCP images were rendered by post processing. CONTRAST:  7mL GADAVIST GADOBUTROL 1 MMOL/ML IV SOLN COMPARISON:  CT abdomen pelvis, 12/14/2023 FINDINGS: Lower chest: No acute abnormality. Numerous small bilateral pulmonary nodules, better assessed by CT (series 14, image 5). Hepatobiliary: Thickened, contracted gallbladder with a heterogeneous mass arising from the gallbladder fundus, contiguous with the adjacent liver parenchyma of hepatic segment IVB measuring 3.5 x 2.8 cm (series 14, image 37). Multiple small rim enhancing lesions within the liver parenchyma, for example in anterior hepatic segment II measuring 1.2 x 1.1 cm (series 14, image 19). Pancreas: Unremarkable. No pancreatic ductal dilatation or surrounding inflammatory changes. Spleen: Normal in  size without significant abnormality. Adrenals/Urinary Tract: Unchanged, definitively benign macroscopic fat containing adrenal adenomata requiring no specific further follow-up or characterization. Kidneys are normal, without obvious renal calculi, solid lesion, or hydronephrosis. Stomach/Bowel: Stomach is within normal limits. No evidence of bowel wall thickening, distention, or inflammatory changes. Vascular/Lymphatic: Aortic atherosclerosis. Enlarged, necrotic appearing portacaval and porta hepatis lymph nodes measuring up 2 2.9 x 1.6 cm (series 14, image 31). Small, although rim enhancing and abnormal appearing retroperitoneal lymph nodes (series 14, image 50). Other: No abdominal wall hernia or abnormality. No ascites. Peritoneal stranding and nodularity, incompletely visualized on this examination although present in the left upper quadrant (series 14, image 29). Musculoskeletal: No acute or significant osseous findings. IMPRESSION: 1. Thickened, contracted gallbladder with a heterogeneous mass arising from the gallbladder fundus, contiguous with the adjacent liver parenchyma of hepatic segment IVB measuring 3.5 x 2.8 cm. 2. Multiple small rim enhancing lesions within the liver parenchyma. 3. Enlarged, necrotic appearing portacaval and porta hepatis lymph nodes. Small, although rim enhancing and abnormal appearing retroperitoneal lymph nodes. 4. Peritoneal stranding and nodularity, incompletely visualized on this examination although present in the left upper quadrant, consistent with peritoneal metastatic disease. 5. Numerous small bilateral pulmonary nodules, better assessed by CT. 6. 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 Aortic Atherosclerosis (ICD10-I70.0). Electronically Signed   By: Fredricka Jenny M.D.   On: 12/14/2023 21:08   MR 3D Recon At Scanner Result Date: 12/14/2023 CLINICAL DATA:  Gallbladder malignancy suspected, gallbladder  mass, pulmonary metastases, and peritoneal metastatic disease identified by prior CT EXAM: MRI ABDOMEN WITHOUT AND WITH CONTRAST (INCLUDING MRCP) TECHNIQUE: Multiplanar multisequence MR imaging of the abdomen was performed both before and after the administration of intravenous contrast. Heavily T2-weighted images of the biliary and pancreatic ducts were obtained, and three-dimensional MRCP images were rendered by post processing. CONTRAST:  7mL GADAVIST GADOBUTROL 1 MMOL/ML IV SOLN COMPARISON:  CT abdomen pelvis, 12/14/2023 FINDINGS: Lower chest: No acute abnormality. Numerous small bilateral pulmonary nodules, better assessed by CT (series 14, image 5). Hepatobiliary: Thickened, contracted gallbladder with a heterogeneous mass arising from the gallbladder fundus, contiguous with the adjacent liver parenchyma of hepatic segment IVB measuring 3.5 x 2.8 cm (series 14, image 37). Multiple small rim enhancing lesions within the liver parenchyma, for example in anterior hepatic segment II measuring 1.2 x 1.1 cm (series 14, image 19). Pancreas: Unremarkable. No pancreatic ductal dilatation or surrounding inflammatory changes. Spleen: Normal in size without significant abnormality. Adrenals/Urinary Tract: Unchanged, definitively benign macroscopic fat containing adrenal adenomata requiring no specific further follow-up or characterization. Kidneys are normal, without obvious renal calculi, solid lesion, or hydronephrosis. Stomach/Bowel: Stomach is within normal limits. No evidence of bowel wall thickening, distention, or inflammatory changes. Vascular/Lymphatic: Aortic atherosclerosis. Enlarged, necrotic appearing portacaval and porta hepatis lymph nodes measuring up 2 2.9 x 1.6 cm (series 14, image 31). Small, although rim enhancing and abnormal appearing retroperitoneal lymph nodes (series 14, image 50). Other: No abdominal wall hernia or abnormality. No ascites. Peritoneal stranding and nodularity, incompletely  visualized on this examination although present in the left upper quadrant (series 14, image 29). Musculoskeletal: No acute or significant osseous findings. IMPRESSION: 1. Thickened, contracted gallbladder with a heterogeneous mass arising from the gallbladder fundus, contiguous with the adjacent liver parenchyma of hepatic segment IVB measuring 3.5 x 2.8 cm. 2. Multiple small rim enhancing lesions within the liver parenchyma. 3. Enlarged, necrotic appearing portacaval and porta hepatis lymph nodes. Small, although rim enhancing and abnormal  appearing retroperitoneal lymph nodes. 4. Peritoneal stranding and nodularity, incompletely visualized on this examination although present in the left upper quadrant, consistent with peritoneal metastatic disease. 5. Numerous small bilateral pulmonary nodules, better assessed by CT. 6. 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 Aortic Atherosclerosis (ICD10-I70.0). Electronically Signed   By: Fredricka Jenny M.D.   On: 12/14/2023 21:08   CT ABDOMEN PELVIS W CONTRAST Result Date: 12/14/2023 CLINICAL DATA:  Right upper quadrant abdominal pain EXAM: CT ABDOMEN AND PELVIS WITH CONTRAST TECHNIQUE: Multidetector CT imaging of the abdomen and pelvis was performed using the standard protocol following bolus administration of intravenous contrast. RADIATION DOSE REDUCTION: This exam was performed according to the departmental dose-optimization program which includes automated exposure control, adjustment of the mA and/or kV according to patient size and/or use of iterative reconstruction technique. CONTRAST:  OMNIPAQUE  IOHEXOL  300 MG/ML  SOLN COMPARISON:  Nov 19, 2022 FINDINGS: Lower chest: Several nodules in both lower lobes are identified, measuring between 3 and 4 mm in the right lower lobe and 3 and 5 mm in the lingula, as well as multiple nodules in the left lower lobe posteromedial segment the largest 9 mm in  diameter on image number 8. And in the right lower lobe image number 25 measuring 12 mm in diameter. These nodules were not identified on the prior CT from 2024 there are 4 suspicious for metastatic lung disease. Hepatobiliary: Liver appears normal in size no parenchymal lesions however, gallbladder appears irregular, along the fundus of the gallbladder with a suggestion of capsular thickening measuring 2.4 x 0.9 cm. In addition the gallbladder appears inseparable from the adjacent portion of the liver, the possibility of gallbladder carcinoma should be considered and excluded clinically. Correlation with recent ultrasound demonstrates poor visualization of the gallbladder. The findings are not typical for acute cholecystitis in the CT images. Correlation with MR and MRCP is recommended. Pancreas: Unremarkable. No pancreatic ductal dilatation or surrounding inflammatory changes. Spleen: Normal in size without focal abnormality. Adrenals/Urinary Tract: Adrenal glands are unremarkable. Kidneys are normal, without renal calculi, focal lesion, or hydronephrosis. Bladder is unremarkable. Stomach/Bowel: There is mesenteric nodular thickening along the mid lower abdominal cavity better seen on images 56-60 and may suggest peritoneal carcinomatosis. Soft tissue nodular masses noted in the left lower quadrant region of the pelvis on image number 65 measuring 3.6 x 2.7 cm this could correlate with an ovarian neoplastic lesion. Again not present on prior examination 2024 Reproductive: Prior hysterectomy Other: No abdominal wall hernia or abnormality. No abdominopelvic ascites. Musculoskeletal: Extensive postsurgical changes of the thoracolumbar spine with intervertebral disc replacements L2-L3 L4 and L5. IMPRESSION: *Multiple pulmonary nodules in the lower lobes bilaterally suspicious for metastatic lung disease. *Irregular appearance of the gallbladder with a suggestion of capsular thickening along the fundus of the  gallbladder. In addition the gallbladder appears inseparable from the adjacent portion of the liver, the possibility of gallbladder carcinoma should be considered and excluded clinically. Correlation with recent ultrasound demonstrates poor visualization of the gallbladder. The findings are not typical for acute cholecystitis in the CT images. Correlation with MR and MRCP is recommended. *Mesenteric nodular thickening along the mid lower abdominal cavity may suggest peritoneal carcinomatosis. *Soft tissue nodular mass in the left lower quadrant region of the pelvis measuring 3.6 x 2.7 cm this could correlate with an ovarian neoplastic lesion. Again not present on prior examination 2024. *Extensive postsurgical changes of the thoracolumbar spine with intervertebral disc replacements L2-L3 L4 and L5.  Electronically Signed   By: Fredrich Jefferson M.D.   On: 12/14/2023 15:18   US  Abdomen Limited RUQ (LIVER/GB) Result Date: 12/14/2023 CLINICAL DATA:  [id number] RUQ pain 151471 EXAM: ULTRASOUND ABDOMEN LIMITED RIGHT UPPER QUADRANT COMPARISON:  Nov 19, 2022 FINDINGS: Gallbladder: The gallbladder was not well visualized or evaluated. The sonographer reports right upper quadrant pain in this region during the exam. Common bile duct: Diameter: 4 mm Liver: Normal echogenicity. No focal lesion identified. No intrahepatic biliary ductal dilation. Portal vein is patent on color Doppler imaging with normal direction of blood flow towards the liver. Other: None. IMPRESSION: 1. The gallbladder was not well visualized or evaluated during the exam; however, the sonographer reports right upper quadrant pain in this region. A follow-up CT of the abdomen and pelvis with IV contrast could be considered for further characterization. 2. No intrahepatic or extrahepatic biliary ductal dilation. Electronically Signed   By: Rance Burrows M.D.   On: 12/14/2023 13:05    Time coordinating discharge: 55 mins  SIGNED:  Unk Garb, DO [facility] 12/16/23, 2:21 PM

## 2023-12-16 NOTE — Subjective & Objective (Signed)
 Pt seen and examined. Met with pt and her husband at bedside.  Cleared for discharge today by palliative and gyn/onc. Biopsy report still pending.  Pt not using any IV dilaudid  for pain. Only po oxycodone . Pt wants to go home. Husband agreeable to taking her home.

## 2023-12-17 ENCOUNTER — Telehealth: Payer: Self-pay

## 2023-12-17 LAB — CANCER ANTIGEN 19-9: CA 19-9: 14368 U/mL — ABNORMAL HIGH (ref 0–35)

## 2023-12-17 LAB — SURGICAL PATHOLOGY

## 2023-12-17 NOTE — Patient Instructions (Signed)
Visit Information  Thank you for taking time to visit with me today. Please don't hesitate to contact me if I can be of assistance to you before our next scheduled telephone appointment.  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  Patient awaiting results of biopsy - ambulatory palliative referral noted and reviewed with patient - Oncology appointment scheduled 12/24/23 - (Dr Maryalice Smaller is out of the country so scheduled with Dr Randye Buttner)  Goal:  Over the next 30 days, the patient will not experience hospital readmission  Interventions:  Transitions of Care: Doctor Visits  - discussed the importance of doctor visits Arranged PCP follow-up within 7 days (Care Guide Scheduled) - scheduled 12/22/23 Arranged oncology appointment - scheduled 12/24/23  Reconciled medications - patient was confused as to instructions of meds to pause -education provided - all meds reviewed-patient will discuss paused meds at PCP hospital follow up appointment   COPD Interventions: Assessed social determinant of health barriers Discussed the importance of adequate rest and management of fatigue with COPD Provided instruction about proper use of medications used for management of COPD including inhalers   Oncology: Assessment of understanding of oncology diagnosis: patient awaiting results of biopsy - only current plan at this time is 12/24/23 oncology appointment Assessed available transportation to appointments and treatments. Has consistent/reliable transportation: {YES - husband Assessed support system. Has consistent/reliable family or other support: YES husband  Patient Self Care Activities:   Attend all scheduled provider appointments Call pharmacy for medication refills 3-7 days in advance of running out of medications Call provider office for new concerns or questions  Notify RN Care Manager of [facility] call rescheduling needs Participate in Transition of Care Program/Attend TOC scheduled calls Take medications as prescribed   keep follow-up appointments: Pulmonary appt 03/04/24 with Arlyce Lambert Deward eat healthy/prescribed diet: heart healthy low sodium  Plan:  Telephone follow up appointment with care management team member scheduled for:  12/25/23 11am appointment scheduled with primary [facility] RN, Orpha Blade  The patient has been provided with contact information for the care management team and has been advised to call with any health related questions or concerns.         Patient verbalizes understanding of instructions and care plan provided today and agrees to view in MyChart. Active MyChart status and patient understanding of how to access instructions and care plan via MyChart confirmed with patient.     Telephone follow up appointment with care management team member scheduled for:12/25/23 11am with primary TOC RN, Orpha Blade The patient has been provided with contact information for the care management team and has been advised to call with any health related questions or concerns.   Please call the care guide team at [phone] if you need to cancel or reschedule your appointment.   Please call the Suicide and Crisis Lifeline: 988 call 1-800-273-TALK (toll free, 24 hour hotline) call the [facility]: [phone] if you are experiencing a Mental Health or Behavioral Health Crisis or need someone to talk to.  Tonia Frankel RN, CCM [place]  VBCI-Population Health RN Care Manager  [phone]  

## 2023-12-17 NOTE — Transitions of Care (Post Inpatient/ED Visit) (Signed)
 12/17/2023  Name: Janice Pace MRN: [id number] DOB: [birth year]  Today's TOC FU Call Status: Today's TOC FU Call Status:: Successful TOC FU Call Completed TOC FU Call Complete Date: 12/17/23 Patient's Name and Date of Birth confirmed.  Transition Care Management Follow-up Telephone Call Date of Discharge: 12/16/23 Discharge Facility: [facility]) Type of Discharge: Inpatient Admission Primary Inpatient Discharge Diagnosis:: Carcinomatosis peritonei Abdominal Pain & Constipation How have you been since you were released from the hospital?: Better Any questions or concerns?: No  Items Reviewed: Did you receive and understand the discharge instructions provided?: Yes Medications obtained,verified, and reconciled?: Yes (Medications Reviewed) Any new allergies since your discharge?: No Dietary orders reviewed?: Yes Type of Diet Ordered:: low sodium heart healthy Do you have support at home?: Yes People in Home [RPT]: spouse Name of Support/Comfort Primary Source: lives in home with husband  Medications Reviewed Today: Medications Reviewed Today     Reviewed by Sharmaine Dearth, RN (Registered Nurse) on 12/17/23 at 1014  Med List Status: <None>   Medication Order Taking? Sig Documenting Provider Last Dose Status Informant  albuterol  (VENTOLIN  HFA) 108 (90 Base) MCG/ACT inhaler [id number] Yes Inhale 1-2 puffs into the lungs every 6 (six) hours as needed for wheezing or shortness of breath. Roetta Clarke, NP Taking Active Self  aspirin  81 MG EC tablet [id number] Yes Take 81 mg by mouth daily. [provider] Taking Active Self  budesonide  (PULMICORT ) 0.5 MG/2ML nebulizer solution [id number] Yes Generic for Pulmicort . Inhale one vial in nebulizer twice a day. Rinse mouth after use.  Patient taking differently: Take 0.5 mg by nebulization 2 (two) times daily.   Wilfredo Hanly, MD Taking Active Self  buPROPion  ER (WELLBUTRIN  SR) 100 MG 12 hr tablet [id number] Yes Take 2  tablets (200 mg total) by mouth 2 (two) times daily. Noreene Bearded, PA Taking Active Self  chlordiazePOXIDE  (LIBRIUM ) 10 MG capsule [id number] Yes Take 1 capsule (10 mg total) by mouth as needed for anxiety (max of twice daily). Unk Garb, DO Taking Active   docusate sodium  (COLACE) 100 MG capsule [id number] Yes Take 500 mg by mouth at bedtime. [provider] Taking Active Self  formoterol  (PERFOROMIST ) 20 MCG/2ML nebulizer solution [id number] Yes Substituted for: Perforomist  Solution Inhale one vial in nebulizer twice a day.  Patient taking differently: Take 20 mcg by nebulization 2 (two) times daily.   Wilfredo Hanly, MD Taking Active Self  metoprolol  succinate (TOPROL -XL) 50 MG 24 hr tablet [id number] Yes TAKE 1 TABLET BY MOUTH ONCE DAILY. TAKE WITH OR IMMEDIATELY FOLLOWING A MEAL  Patient taking differently: Take 50 mg by mouth at bedtime.   Laneta Pintos, MD Taking Active Self  naltrexone  (DEPADE) 50 MG tablet [id number] No Take 0.5 tablets (25 mg total) by mouth daily.  Patient not taking: Reported on 12/17/2023   Noreene Bearded, [place] Not Taking Active Self  NON FORMULARY [id number] Yes Take 8 oz by mouth See admin instructions. Mushroom coffee: Drink 8-10 ounces by mouth once a day as needed to aid bowel movements [provider] Taking Active Self  omeprazole  (PRILOSEC) 20 MG capsule [id number] Yes Take 1 capsule (20 mg total) by mouth daily as needed (acid reflux). Noreene Bearded, PA Taking Active Self  ondansetron  (ZOFRAN ) 4 MG tablet [id number] Yes Take 1 tablet (4 mg total) by mouth every 6 (six) hours as needed for nausea. Unk Garb, DO Taking Active   oxyCODONE  10 MG TABS [id number] Yes Take 1-1.5 tablets (  10-15 mg total) by mouth every 4 (four) hours as needed for moderate pain (pain score 4-6) or severe pain (pain score 7-10). Unk Garb, DO Taking Active   pantoprazole  (PROTONIX ) 40 MG tablet [id number] Yes Take 1 tablet (40 mg total) by mouth daily before  breakfast. Unk Garb, DO Taking Active   revefenacin  (YUPELRI ) 175 MCG/3ML nebulizer solution [id number] Yes Take 3 mLs (175 mcg total) by nebulization daily. Wilfredo Hanly, MD Taking Active Self  rosuvastatin  (CRESTOR ) 10 MG tablet [id number] Yes Take 1 tablet (10 mg total) by mouth daily. Noreene Bearded, PA Taking Active Self  spironolactone  (ALDACTONE ) 25 MG tablet [id number] No TAKE 1 TABLET BY MOUTH ONCE DAILY IN THE MORNING  Patient not taking: Reported on 12/17/2023   Knox Perl, MD Not Taking Active Self            Home Care and Equipment/Supplies: Were Home Health Services Ordered?: No Any new equipment or medical supplies ordered?: No  Functional Questionnaire: Do you need assistance with bathing/showering or dressing?: No Do you need assistance with meal preparation?: No Do you need assistance with eating?: No Do you have difficulty maintaining continence: No Do you need assistance with getting out of bed/getting out of a chair/moving?: No Do you have difficulty managing or taking your medications?: Yes (patient states she and her husband manage meds together)  Follow up appointments reviewed: PCP Follow-up appointment confirmed?: Yes Date of PCP follow-up appointment?: 12/22/23 (Care Guide changed regular follow up appointment scheduled 6/10- to hospital follow up) Follow-up Provider: Karel Osler Follow-up appointment confirmed?: Yes Date of Specialist follow-up appointment?: 12/24/23 (Conference call placed to office of Sonja Eveleth, oncology and appt was scheduled with Dr Randye Buttner) Follow-Up Specialty Provider:: Dr Randye Buttner Do you need transportation to your follow-up appointment?: No Do you understand care options if your condition(s) worsen?: Yes-patient verbalized understanding  SDOH Interventions Today    Flowsheet Row Most Recent Value  SDOH Interventions   Food Insecurity Interventions Intervention Not Indicated  Housing Interventions  Intervention Not Indicated  Transportation Interventions Intervention Not Indicated  Utilities Interventions Intervention Not Indicated       Goals Addressed             This Visit's Progress    VBCI Transitions of Care (TOC) Care Plan       Problems:  Recent Hospitalization for treatment of Right side abdominal pain 2 weeks prior to admission - 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  Patient awaiting results of biopsy - ambulatory palliative referral noted and reviewed with patient - Oncology appointment scheduled 12/24/23 - (Dr Maryalice Smaller is out of the [place] so scheduled with Dr Randye Buttner)  Goal:  Over the next 30 days, the patient will not experience hospital readmission  Interventions:  Transitions of Care: Doctor Visits  - discussed the importance of doctor visits Arranged PCP follow-up within 7 days (Care Guide Scheduled) - scheduled 12/22/23 Arranged oncology appointment - scheduled 12/24/23  Reconciled medications - patient was confused as to instructions of meds to pause -education provided - all meds reviewed-patient will discuss paused meds at PCP hospital follow up appointment   COPD Interventions: Assessed social determinant of health barriers Discussed the importance of adequate rest and management of fatigue with COPD Provided instruction about proper use of medications used for management of COPD including inhalers   Oncology: Assessment of understanding of oncology diagnosis: patient awaiting results of biopsy - only current plan at this time is 12/24/23 oncology appointment Assessed  available transportation to appointments and treatments. Has consistent/reliable transportation: {YES - husband Assessed support system. Has consistent/reliable family or other support: YES husband  Patient Self Care Activities:  Attend all scheduled provider appointments Call pharmacy for medication refills 3-7 days in advance of running out of medications Call provider  office for new concerns or questions  Notify RN Care Manager of [facility] call rescheduling needs Participate in Transition of Care Program/Attend TOC scheduled calls Take medications as prescribed   keep follow-up appointments: Pulmonary appt 03/04/24 with Arlyce Lambert Deward eat healthy/prescribed diet: heart healthy low sodium  Plan:  Telephone follow up appointment with care management team member scheduled for:  12/25/23 11am appointment scheduled with primary [facility] RN, Orpha Blade  The patient has been provided with contact information for the care management team and has been advised to call with any health related questions or concerns.         Tonia Frankel RN, CCM [place]  VBCI-Population Health RN Care Manager [phone]

## 2023-12-21 NOTE — ED Provider Notes (Signed)
 [facility]  [facility] GENERAL SURGERY Provider Note   CSN: [id number] Arrival date & time: 12/14/23  1008     History  Chief Complaint  Patient presents with   Abdominal Pain   Constipation    Janice Pace is a 78 y.o. female.  HPI     78 y.o. female with medical history significant of essential hypertension, osteoarthritis, COPD/asthma, GERD, depression, anxiety, hiatal hernia, chronic back pain comes to the hospital complains of abdominal pain.  Patient states she started having abdominal pain which has been constant in the epigastric region for about past 2 weeks.  Denies any nausea, vomiting, fever, weight loss, chills and any other complaints.  For the most part she has been able to tolerate p.o., however, she has noticed that her pain comes about 30 minutes to an hour after p.o. intake.  The pain often is worse at night.  She has not been able to sleep well the last few nights.    Home Medications Prior to Admission medications   Medication Sig Start Date End Date Taking? Authorizing Provider  albuterol  (VENTOLIN  HFA) 108 (90 Base) MCG/ACT inhaler Inhale 1-2 puffs into the lungs every 6 (six) hours as needed for wheezing or shortness of breath. 05/06/22  Yes Cobb, Mariah Shines, NP  aspirin  81 MG EC tablet Take 81 mg by mouth daily.   Yes [provider]  budesonide  (PULMICORT ) 0.5 MG/2ML nebulizer solution Generic for Pulmicort . Inhale one vial in nebulizer twice a day. Rinse mouth after use. Patient taking differently: Take 0.5 mg by nebulization 2 (two) times daily. 01/19/23  Yes Wilfredo Hanly, MD  buPROPion  ER (WELLBUTRIN  SR) 100 MG 12 hr tablet Take 2 tablets (200 mg total) by mouth 2 (two) times daily. 09/25/23  Yes Maryclare Smoke A, PA  docusate sodium  (COLACE) 100 MG capsule Take 500 mg by mouth at bedtime.   Yes [provider]  formoterol  (PERFOROMIST ) 20 MCG/2ML nebulizer solution Substituted for: Perforomist  Solution Inhale one  vial in nebulizer twice a day. Patient taking differently: Take 20 mcg by nebulization 2 (two) times daily. 01/19/23  Yes Wilfredo Hanly, MD  metoprolol  succinate (TOPROL -XL) 50 MG 24 hr tablet TAKE 1 TABLET BY MOUTH ONCE DAILY. TAKE WITH OR IMMEDIATELY FOLLOWING A MEAL Patient taking differently: Take 50 mg by mouth at bedtime. 11/11/23  Yes Laneta Pintos, MD  naltrexone  (DEPADE) 50 MG tablet Take 0.5 tablets (25 mg total) by mouth daily. Patient not taking: Reported on 12/17/2023 06/25/23  Yes Maryclare Smoke A, PA  NON FORMULARY Take 8 oz by mouth See admin instructions. Mushroom coffee: Drink 8-10 ounces by mouth once a day as needed to aid bowel movements   Yes [provider]  omeprazole  (PRILOSEC) 20 MG capsule Take 1 capsule (20 mg total) by mouth daily as needed (acid reflux). 06/25/23  Yes Maryclare Smoke A, PA  revefenacin  (YUPELRI ) 175 MCG/3ML nebulizer solution Take 3 mLs (175 mcg total) by nebulization daily. 02/04/23  Yes Wilfredo Hanly, MD  rosuvastatin  (CRESTOR ) 10 MG tablet Take 1 tablet (10 mg total) by mouth daily. 08/26/23  Yes Maryclare Smoke A, PA  spironolactone  (ALDACTONE ) 25 MG tablet TAKE 1 TABLET BY MOUTH ONCE DAILY IN THE MORNING Patient not taking: Reported on 12/17/2023 05/19/23  Yes Knox Perl, MD  chlordiazePOXIDE  (LIBRIUM ) 10 MG capsule Take 1 capsule (10 mg total) by mouth as needed for anxiety (max of twice daily). 12/16/23 01/15/24  Unk Garb, DO  ondansetron  (ZOFRAN )  4 MG tablet Take 1 tablet (4 mg total) by mouth every 6 (six) hours as needed for nausea. 12/16/23   Unk Garb, DO  oxyCODONE  10 MG TABS Take 1-1.5 tablets (10-15 mg total) by mouth every 4 (four) hours as needed for moderate pain (pain score 4-6) or severe pain (pain score 7-10). 12/16/23   Unk Garb, DO  pantoprazole  (PROTONIX ) 40 MG tablet Take 1 tablet (40 mg total) by mouth daily before breakfast. 12/17/23 01/16/24  Unk Garb, DO      Allergies    Sulfa antibiotics, Forteo [parathyroid  hormone (recomb)], Atorvastatin, and Metformin hcl    Review of Systems   Review of Systems  All other systems reviewed and are negative.   Physical Exam Updated Vital Signs BP 111/61 (BP Location: Left Arm)   Pulse 66   Temp 97.8 F (36.6 C)   Resp 16   Wt 77.1 kg   SpO2 97%   BMI 31.09 kg/m  Physical Exam Vitals and nursing note reviewed.  Constitutional:      Appearance: She is well-developed.  HENT:     Head: Atraumatic.  Cardiovascular:     Rate and Rhythm: Normal rate.  Pulmonary:     Effort: Pulmonary effort is normal.  Abdominal:     Tenderness: There is generalized abdominal tenderness and tenderness in the epigastric area. There is guarding. There is no rebound. Positive signs include Murphy's sign.  Musculoskeletal:     Cervical back: Normal range of motion and neck supple.  Skin:    General: Skin is warm and dry.  Neurological:     Mental Status: She is alert and oriented to person, place, and time.     ED Results / Procedures / Treatments   Labs (all labs ordered are listed, but only abnormal results are displayed) Labs Reviewed  COMPREHENSIVE METABOLIC PANEL WITH GFR - Abnormal; Notable for the following components:      Result Value   Glucose, Bld 119 (*)    BUN 7 (*)    Total Protein 6.3 (*)    All other components within normal limits  URINALYSIS, ROUTINE W REFLEX MICROSCOPIC - Abnormal; Notable for the following components:   APPearance HAZY (*)    All other components within normal limits  CANCER ANTIGEN 19-9 - Abnormal; Notable for the following components:   CA 19-9 14,368 (*)    All other components within normal limits  CEA - Abnormal; Notable for the following components:   CEA 98.7 (*)    All other components within normal limits  CA 125 - Abnormal; Notable for the following components:   Cancer Antigen (CA) 125 91.3 (*)    All other components within normal limits  BASIC METABOLIC PANEL WITH GFR - Abnormal; Notable for the  following components:   Potassium 3.3 (*)    Glucose, Bld 135 (*)    BUN <5 (*)    Calcium  8.5 (*)    All other components within normal limits  CBC - Abnormal; Notable for the following components:   Hemoglobin 11.9 (*)    All other components within normal limits  BASIC METABOLIC PANEL WITH GFR - Abnormal; Notable for the following components:   Glucose, Bld 112 (*)    BUN 6 (*)    All other components within normal limits  LIPASE, BLOOD  CBC  CBC  CREATININE, SERUM  PROTIME-INR  MAGNESIUM   CBC  MAGNESIUM   SURGICAL PATHOLOGY    EKG EKG Interpretation Date/Time:  Monday  December 14 2023 10:17:36 EDT Ventricular Rate:  70 PR Interval:  146 QRS Duration:  77 QT Interval:  404 QTC Calculation: 436 R Axis:   71  Text Interpretation: Sinus rhythm Low voltage, precordial leads No acute changes No significant change since last tracing Confirmed by Deatra Face [phone]) on 12/14/2023 3:09:57 PM  Radiology No results found.  Procedures Procedures    Medications Ordered in ED Medications  dextrose  5 % and 0.45 % NaCl infusion (0 mLs Intravenous Stopped 12/15/23 2013)  iohexol  (OMNIPAQUE ) 300 MG/ML solution 100 mL (100 mLs Intravenous Contrast Given 12/14/23 1338)  fentaNYL  (SUBLIMAZE ) injection 50 mcg (50 mcg Intravenous Given 12/14/23 1604)  gadobutrol  (GADAVIST ) 1 MMOL/ML injection 7 mL (7 mLs Intravenous Contrast Given 12/14/23 1707)  potassium chloride  SA (KLOR-CON  M) CR tablet 40 mEq (40 mEq Oral Given 12/15/23 1223)  midazolam  (VERSED ) injection (1 mg Intravenous Given 12/15/23 1111)  fentaNYL  (SUBLIMAZE ) injection (50 mcg Intravenous Given 12/15/23 1111)    ED Course/ Medical Decision Making/ A&P                                 Medical Decision Making Amount and/or Complexity of Data Reviewed Labs: ordered. Radiology: ordered.  Risk Prescription drug management. Decision regarding hospitalization.   78 year old female comes in with chief complaint of abdominal pain.   Patient has previous history of normal colonoscopy, no concerning surgical history.  ]  Pancreatitis, Hepatobiliary pathology including cholelithiasis and cholecystitis, Gastritis/peptic ulcer disease, small bowel obstruction, Acute coronary syndrome, Aortic Dissection, cancer  Based on initial assessment, high suspicion for cholecystitis. Labs and ultrasound ordered.  Ultrasound shows findings that are equivocal for straight forward cholecystitis, therefore CT abdomen and pelvis has been ordered.  Results of the ED workup discussed with the patient.  Reassessment: I reviewed the CT independently.  No evidence of perforation.  There continues to be irregularity and abnormality around the gallbladder area.  Per radiologist, patient likely has findings that are concerning for malignancy.  We will request admission to medicine for further diagnostics.  Patient made aware of this plan.   Final Clinical Impression(s) / ED Diagnoses Final diagnoses:  Carcinomatosis peritonei (HCC)  Generalized anxiety disorder    Rx / DC Orders ED Discharge Orders          Ordered    pantoprazole  (PROTONIX ) 40 MG tablet  Daily before breakfast        12/16/23 1402    Amb Referral to Palliative Care        12/16/23 0958    oxyCODONE  10 MG TABS  Every 4 hours PRN        12/16/23 1402    chlordiazePOXIDE  (LIBRIUM ) 10 MG capsule  As needed        12/16/23 1402    ondansetron  (ZOFRAN ) 4 MG tablet  Every 6 hours PRN        12/16/23 1402    Increase activity slowly        12/16/23 1402    Diet - low sodium heart healthy        12/16/23 1402    Discharge instructions       Comments: 1. Follow up with your primary care provider in 1-2 weeks following discharge from hospital. 2. Oncology office will call you with appointment once your biopsy results are back.   12/16/23 1402    No wound care        12/16/23  1402    Call MD for:  temperature >100.4        12/16/23 1402    Call MD for:  persistant nausea  and vomiting        12/16/23 1402    Call MD for:  severe uncontrolled pain        12/16/23 1402    Call MD for:  redness, tenderness, or signs of infection (pain, swelling, redness, odor or green/yellow discharge around incision site)        12/16/23 1402    Call MD for:  difficulty breathing, headache or visual disturbances        12/16/23 1402    Call MD for:  hives        12/16/23 1402    Call MD for:  persistant dizziness or light-headedness        12/16/23 1402    Call MD for:  extreme fatigue        12/16/23 1402              Deatra Face, MD 12/21/23 2036

## 2023-12-22 ENCOUNTER — Ambulatory Visit (INDEPENDENT_AMBULATORY_CARE_PROVIDER_SITE_OTHER)

## 2023-12-22 VITALS — BP 122/71 | HR 94 | Ht 62.0 in | Wt 161.8 lb

## 2023-12-22 DIAGNOSIS — E559 Vitamin D deficiency, unspecified: Secondary | ICD-10-CM | POA: Diagnosis not present

## 2023-12-22 DIAGNOSIS — E782 Mixed hyperlipidemia: Secondary | ICD-10-CM | POA: Diagnosis not present

## 2023-12-22 DIAGNOSIS — C786 Secondary malignant neoplasm of retroperitoneum and peritoneum: Secondary | ICD-10-CM

## 2023-12-22 DIAGNOSIS — K5900 Constipation, unspecified: Secondary | ICD-10-CM | POA: Insufficient documentation

## 2023-12-22 DIAGNOSIS — Z1159 Encounter for screening for other viral diseases: Secondary | ICD-10-CM

## 2023-12-22 DIAGNOSIS — K219 Gastro-esophageal reflux disease without esophagitis: Secondary | ICD-10-CM

## 2023-12-22 DIAGNOSIS — R7303 Prediabetes: Secondary | ICD-10-CM

## 2023-12-22 DIAGNOSIS — K5903 Drug induced constipation: Secondary | ICD-10-CM

## 2023-12-22 DIAGNOSIS — I10 Essential (primary) hypertension: Secondary | ICD-10-CM

## 2023-12-22 DIAGNOSIS — Z6835 Body mass index (BMI) 35.0-35.9, adult: Secondary | ICD-10-CM

## 2023-12-22 MED ORDER — OMEPRAZOLE 20 MG PO CPDR
20.0000 mg | DELAYED_RELEASE_CAPSULE | Freq: Two times a day (BID) | ORAL | 3 refills | Status: DC
Start: 2023-12-22 — End: 2024-04-14

## 2023-12-22 NOTE — Assessment & Plan Note (Signed)
 BP goal <130/80.  Stable and at goal today in office.  Patient reports that she is also taking her blood pressure at home and it is within normal limits there.  Continue metoprolol  50 mg daily.  Encouraged to continue ambulatory blood pressure monitoring.  Will continue to monitor.

## 2023-12-22 NOTE — Assessment & Plan Note (Signed)
 Last A1c in November was stable at 6.2.  Am updating A1c today.  Currently controlling with diet and exercise.  Will continue to monitor

## 2023-12-22 NOTE — Assessment & Plan Note (Signed)
 Starting weight: 195 pounds.  At her last visit with Britta Candy she was on bupropion  200 mg twice daily and naltrexone  25 mg daily for weight loss. Current weight: 161 pounds.  And recent diagnosis of cancer and patient being discharged from the hospital on oxycodone , naltrexone  appropriately discontinued at discharge.  Per patient's request due to rapid weight loss likely related to her cancer diagnosis/recent lack of appetite, patient would like to discontinue bupropion  as well.  Can continue to monitor weight at her follow-up visits.

## 2023-12-22 NOTE — Patient Instructions (Addendum)
 It was nice to see you today!  As we discussed in clinic:  -I have updated the medication list you brought with you to reflect that we have stopped the Naltrexone , Buproprion, and Pantoprazole  medications.  -Start a daily dose of Mirilax for constipation. Reserve your milk of magnesia for if you are still constipated after the daily dose of Mirilax. -We increased your Omeprazole  for reflux to 20 mg twice per day  -We are also going to get blood work today. I will send you a MyChart message explaining the results.   -It was nice to meet you!   If you have any problems before your next visit feel free to message me via MyChart (minor issues or questions) or call the office, otherwise you may reach out to schedule an office visit.  Thank you! Meryl Acosta, PA-C

## 2023-12-22 NOTE — Progress Notes (Signed)
 Established Patient Office Visit  Subjective   Patient ID: Janice Pace, female    DOB: 08/24/1945  Age: 78 y.o. MRN: [id number]  Chief Complaint  Patient presents with   Hospitalization Follow-up    HPI  Janice Pace is a 78 y.o. female who presents to the clinic today for hospital follow up and management of HTN, HLD, and preDM.   Hospital follow up: Patient was recently admitted to the hospital on 12/14/2023 with a chief complaint of abdominal pain in the epigastric region that had been going on for 2 weeks.  At the time she denied nausea, vomiting, fever, weight loss, chills or any other complaints.  Upon her arrival to the ER, she was initially being worked up for cholecystitis. Her routine blood work was unremarkable with CT abdomen pelvis showed multiple pleural nodules, abnormal gallbladder pathology and peritoneal carcinomatosis.  Medical team requested to admit this patient. On 12/16/2023 patient had a CT-guided biopsy to investigate gallbladder cancer, suspicious for mets originating from her left ovary. MRCP findings consistent with gallbladder malignancy and associated metastatic disease.  Tumor markers CEA, CA125, CA 19-9 were all elevated.  Biopsy of her omentum did reveal moderately-differentiated adenocarcinoma. Pt seen by both heme/onc and Gyn/Onc during the course of her hospital stay. They will ensure that pt gets appropriate f/u appointment. DC to home with oxycodone  10-15 mg prn. Pt advised to get OTC laxatives to use at home after she left the hospital.  Today the patient reports that she is overall doing well given the circumstances of what she has been through in the last 2 weeks.  She was discharged with oxycodone  10 mg every 4-6 hours to control her abdominal pain.  Patient reports that when she takes his medication it keeps her pain well-controlled, however if it wears off and she forgets to take it her pain is around a 9 out of 10 on the pain scale.  She also endorses  some recent constipation, which she reports last night she tried Motrin coffee, suppository/enema, and a dose of milk of magnesia which did lead to a bowel movement.  Patient is requesting advice on what to do to control the constipation while on the pain medication.  Patient reports that she has her first follow-up appointment with outpatient oncology scheduled for this Thursday, 12/24/2023, to discuss treatment options for her cancer now that her biopsy results are back.  She is hopeful for this appointment and is excited to start treatment as soon as possible.   HTN: Currently taking metoprolol .  Reports excellent compliance.  Denies episodes of hypotension.  Checks BP at home and reports good readings.  Denies chest pain, shortness of breath, vision changes, palpitations, edema.  HLD: Currently on rosuvastatin .  Reports excellent compliance.  Denies side effects or myalgias.  Denies chest pain, palpitations, shortness of breath, edema.   PreDM: Most recent A1c from 6 months ago was 6.2.  Currently controlling with diet and exercise.  Denies polydipsia, polyuria, new sores/wounds that are not healing.   Weight loss: At hospital discharge, patient was instructed to discontinue her naltrexone  which was prescribed by Britta Candy for weight loss.  This medication was discontinued due to the fact that she was discharged on oxycodone  for pain control.  Patient reports that she is already losing enough weight due to her lack of appetite and the stress from her recent cancer diagnosis and is requesting to also discontinue the bupropion  that was prescribed by Britta Candy in combination with the  naltrexone  for weight loss..   Constipation:     ROS Per HPI.    Objective:     BP 122/71   Pulse 94   Ht 5\' 2"  (1.575 m)   Wt 161 lb 12 oz (73.4 kg)   SpO2 97%   BMI 29.58 kg/m    Physical Exam Constitutional:      General: She is not in acute distress.    Appearance: Normal appearance.  HENT:      Mouth/Throat:     Mouth: Mucous membranes are moist.     Pharynx: No posterior oropharyngeal erythema.  Eyes:     Pupils: Pupils are equal, round, and reactive to light.  Cardiovascular:     Rate and Rhythm: Normal rate and regular rhythm.     Heart sounds: Normal heart sounds. No murmur heard.    No friction rub. No gallop.  Pulmonary:     Effort: Pulmonary effort is normal. No respiratory distress.     Breath sounds: Normal breath sounds.  Abdominal:     General: Abdomen is flat. Bowel sounds are normal.     Palpations: Abdomen is soft.     Tenderness: There is abdominal tenderness.  Musculoskeletal:        General: No swelling.     Cervical back: Neck supple.  Lymphadenopathy:     Cervical: No cervical adenopathy.  Skin:    General: Skin is warm and dry.  Neurological:     General: No focal deficit present.     Mental Status: She is alert.  Psychiatric:        Mood and Affect: Mood normal.        Behavior: Behavior normal.        Thought Content: Thought content normal.      No results found for any visits on 12/22/23.    The ASCVD Risk score (Arnett DK, et al., 2019) failed to calculate for the following reasons:   The valid total cholesterol range is 130 to 320 mg/dL    Assessment & Plan:   Screening for viral disease -     Hepatitis C antibody  Vitamin D  deficiency Assessment & Plan: Rechecking vitamin D  level today.  Patient not currently on a supplement.  Will follow-up with the results.  Orders: -     VITAMIN D  25 Hydroxy (Vit-D Deficiency, Fractures)  Prediabetes Assessment & Plan: Last A1c in November was stable at 6.2.  Am updating A1c today.  Currently controlling with diet and exercise.  Will continue to monitor  Orders: -     Hemoglobin A1c  Mixed hyperlipidemia Assessment & Plan: Updating lipid panel today.  Continue rosuvastatin  10 mg daily. Will continue to monitor. The ASCVD Risk score (Arnett DK, et al., 2019) failed to calculate for  the following reasons:   The valid total cholesterol range is 130 to 320 mg/dL   Orders: -     Lipid panel  Essential hypertension Assessment & Plan: BP goal <130/80.  Stable and at goal today in office.  Patient reports that she is also taking her blood pressure at home and it is within normal limits there.  Continue metoprolol  50 mg daily.  Encouraged to continue ambulatory blood pressure monitoring.  Will continue to monitor.  Orders: -     CBC with Differential/Platelet -     Comprehensive metabolic panel with GFR  Severe obesity (BMI 35.0-35.9 with comorbidity) (HCC) Assessment & Plan: Starting weight: 195 pounds.  At her last visit  with Britta Candy she was on bupropion  200 mg twice daily and naltrexone  25 mg daily for weight loss. Current weight: 161 pounds.  And recent diagnosis of cancer and patient being discharged from the hospital on oxycodone , naltrexone  appropriately discontinued at discharge.  Per patient's request due to rapid weight loss likely related to her cancer diagnosis/recent lack of appetite, patient would like to discontinue bupropion  as well.  Can continue to monitor weight at her follow-up visits.  Orders: -     CBC with Differential/Platelet -     Comprehensive metabolic panel with GFR  Gastroesophageal reflux disease without esophagitis Assessment & Plan: Patient previously on omeprazole  20 mg daily for GERD.  At her recent hospital discharge, she was discharged with a duplicate medication of pantoprazole .  Discussed with patient to discontinue the pantoprazole  and will increase omeprazole  to 20 mg twice daily for total of 40 mg.  Will continue to monitor.   Carcinomatosis peritonei [facility]) Assessment & Plan: Advised patient to keep follow-up this coming Thursday (12/24/2023) with oncology to discuss treatment options for her recent cancer diagnosis.  Will continue to monitor and coordinate care with oncology per their recommendations.   Drug-induced  constipation Assessment & Plan: Start single daily dose of MiraLAX  and over-the-counter stool softener to prevent constipation while on oxycodone  for pain control related to cancer.  Also recommended patient to increase her daily water intake to prevent hard/dry stools.  If still experiencing constipation with these measures, advised patient to use over-the-counter suppository/enema or milk of magnesia for relief.  Advised patient that if constipation worsens or does not improve with these recommendations to follow-up.  Will continue to monitor.   Other orders -     Omeprazole ; Take 1 capsule (20 mg total) by mouth 2 (two) times daily before a meal.  Dispense: 60 capsule; Refill: 3    Return in about 6 months (around 06/22/2024) for HTN, DM, HLD.    Odilia Bennett, PA-C

## 2023-12-22 NOTE — Assessment & Plan Note (Addendum)
 Updating lipid panel today.  Continue rosuvastatin  10 mg daily. Will continue to monitor. The ASCVD Risk score (Arnett DK, et al., 2019) failed to calculate for the following reasons:   The valid total cholesterol range is 130 to 320 mg/dL

## 2023-12-22 NOTE — Assessment & Plan Note (Signed)
 Advised patient to keep follow-up this coming Thursday (12/24/2023) with oncology to discuss treatment options for her recent cancer diagnosis.  Will continue to monitor and coordinate care with oncology per their recommendations.

## 2023-12-22 NOTE — Assessment & Plan Note (Signed)
 Rechecking vitamin D  level today.  Patient not currently on a supplement.  Will follow-up with the results.

## 2023-12-22 NOTE — Assessment & Plan Note (Signed)
 Start single daily dose of MiraLAX  and over-the-counter stool softener to prevent constipation while on oxycodone  for pain control related to cancer.  Also recommended patient to increase her daily water intake to prevent hard/dry stools.  If still experiencing constipation with these measures, advised patient to use over-the-counter suppository/enema or milk of magnesia for relief.  Advised patient that if constipation worsens or does not improve with these recommendations to follow-up.  Will continue to monitor.

## 2023-12-22 NOTE — Assessment & Plan Note (Signed)
 Patient previously on omeprazole  20 mg daily for GERD.  At her recent hospital discharge, she was discharged with a duplicate medication of pantoprazole .  Discussed with patient to discontinue the pantoprazole  and will increase omeprazole  to 20 mg twice daily for total of 40 mg.  Will continue to monitor.

## 2023-12-23 ENCOUNTER — Ambulatory Visit: Payer: Self-pay

## 2023-12-23 LAB — CBC WITH DIFFERENTIAL/PLATELET
Basophils Absolute: 0 10*3/uL (ref 0.0–0.2)
Basos: 0 %
EOS (ABSOLUTE): 0 10*3/uL (ref 0.0–0.4)
Eos: 0 %
Hematocrit: 45.8 % (ref 34.0–46.6)
Hemoglobin: 15.2 g/dL (ref 11.1–15.9)
Immature Grans (Abs): 0 10*3/uL (ref 0.0–0.1)
Immature Granulocytes: 0 %
Lymphocytes Absolute: 1.1 10*3/uL (ref 0.7–3.1)
Lymphs: 15 %
MCH: 29.4 pg (ref 26.6–33.0)
MCHC: 33.2 g/dL (ref 31.5–35.7)
MCV: 89 fL (ref 79–97)
Monocytes Absolute: 0.5 10*3/uL (ref 0.1–0.9)
Monocytes: 7 %
Neutrophils Absolute: 5.5 10*3/uL (ref 1.4–7.0)
Neutrophils: 78 %
Platelets: 292 10*3/uL (ref 150–450)
RBC: 5.17 x10E6/uL (ref 3.77–5.28)
RDW: 13.6 % (ref 11.7–15.4)
WBC: 7.1 10*3/uL (ref 3.4–10.8)

## 2023-12-23 LAB — LIPID PANEL
Chol/HDL Ratio: 3.1 ratio (ref 0.0–4.4)
Cholesterol, Total: 122 mg/dL (ref 100–199)
HDL: 39 mg/dL — ABNORMAL LOW (ref >39)
LDL Chol Calc (NIH): 60 mg/dL (ref 0–99)
Triglycerides: 129 mg/dL (ref 0–149)
VLDL Cholesterol Cal: 23 mg/dL (ref 5–40)

## 2023-12-23 LAB — COMPREHENSIVE METABOLIC PANEL WITH GFR
ALT: 22 IU/L (ref 0–32)
AST: 25 IU/L (ref 0–40)
Albumin: 4.7 g/dL (ref 3.8–4.8)
Alkaline Phosphatase: 112 IU/L (ref 44–121)
BUN/Creatinine Ratio: 11 — ABNORMAL LOW (ref 12–28)
BUN: 10 mg/dL (ref 8–27)
Bilirubin Total: 0.9 mg/dL (ref 0.0–1.2)
CO2: 18 mmol/L — ABNORMAL LOW (ref 20–29)
Calcium: 10.2 mg/dL (ref 8.7–10.3)
Chloride: 102 mmol/L (ref 96–106)
Creatinine, Ser: 0.89 mg/dL (ref 0.57–1.00)
Globulin, Total: 2.5 g/dL (ref 1.5–4.5)
Glucose: 141 mg/dL — ABNORMAL HIGH (ref 70–99)
Potassium: 4.4 mmol/L (ref 3.5–5.2)
Sodium: 140 mmol/L (ref 134–144)
Total Protein: 7.2 g/dL (ref 6.0–8.5)
eGFR: 67 mL/min/{1.73_m2} (ref >59)

## 2023-12-23 LAB — HEPATITIS C ANTIBODY: Hep C Virus Ab: NONREACTIVE

## 2023-12-23 LAB — HEMOGLOBIN A1C
Est. average glucose Bld gHb Est-mCnc: 114 mg/dL
Hgb A1c MFr Bld: 5.6 % (ref 4.8–5.6)

## 2023-12-23 LAB — VITAMIN D 25 HYDROXY (VIT D DEFICIENCY, FRACTURES): Vit D, 25-Hydroxy: 38.1 ng/mL (ref 30.0–100.0)

## 2023-12-24 ENCOUNTER — Inpatient Hospital Stay

## 2023-12-24 ENCOUNTER — Encounter: Payer: Self-pay | Admitting: Oncology

## 2023-12-24 ENCOUNTER — Encounter (HOSPITAL_COMMUNITY): Payer: Self-pay

## 2023-12-24 ENCOUNTER — Inpatient Hospital Stay: Attending: Oncology | Admitting: Oncology

## 2023-12-24 ENCOUNTER — Other Ambulatory Visit: Payer: Self-pay | Admitting: Pharmacist

## 2023-12-24 VITALS — BP 143/83 | HR 71 | Temp 97.6°F | Resp 17 | Ht 62.0 in | Wt 163.7 lb

## 2023-12-24 DIAGNOSIS — K5903 Drug induced constipation: Secondary | ICD-10-CM | POA: Insufficient documentation

## 2023-12-24 DIAGNOSIS — J4489 Other specified chronic obstructive pulmonary disease: Secondary | ICD-10-CM | POA: Insufficient documentation

## 2023-12-24 DIAGNOSIS — M1990 Unspecified osteoarthritis, unspecified site: Secondary | ICD-10-CM | POA: Diagnosis not present

## 2023-12-24 DIAGNOSIS — Z66 Do not resuscitate: Secondary | ICD-10-CM | POA: Insufficient documentation

## 2023-12-24 DIAGNOSIS — C778 Secondary and unspecified malignant neoplasm of lymph nodes of multiple regions: Secondary | ICD-10-CM | POA: Insufficient documentation

## 2023-12-24 DIAGNOSIS — I700 Atherosclerosis of aorta: Secondary | ICD-10-CM | POA: Insufficient documentation

## 2023-12-24 DIAGNOSIS — C786 Secondary malignant neoplasm of retroperitoneum and peritoneum: Secondary | ICD-10-CM | POA: Insufficient documentation

## 2023-12-24 DIAGNOSIS — Z7189 Other specified counseling: Secondary | ICD-10-CM | POA: Insufficient documentation

## 2023-12-24 DIAGNOSIS — F419 Anxiety disorder, unspecified: Secondary | ICD-10-CM | POA: Insufficient documentation

## 2023-12-24 DIAGNOSIS — N390 Urinary tract infection, site not specified: Secondary | ICD-10-CM | POA: Diagnosis not present

## 2023-12-24 DIAGNOSIS — K828 Other specified diseases of gallbladder: Secondary | ICD-10-CM | POA: Diagnosis not present

## 2023-12-24 DIAGNOSIS — Z90710 Acquired absence of both cervix and uterus: Secondary | ICD-10-CM | POA: Insufficient documentation

## 2023-12-24 DIAGNOSIS — Z882 Allergy status to sulfonamides status: Secondary | ICD-10-CM | POA: Insufficient documentation

## 2023-12-24 DIAGNOSIS — D6959 Other secondary thrombocytopenia: Secondary | ICD-10-CM | POA: Diagnosis not present

## 2023-12-24 DIAGNOSIS — D72819 Decreased white blood cell count, unspecified: Secondary | ICD-10-CM | POA: Diagnosis not present

## 2023-12-24 DIAGNOSIS — K5900 Constipation, unspecified: Secondary | ICD-10-CM

## 2023-12-24 DIAGNOSIS — T402X5A Adverse effect of other opioids, initial encounter: Secondary | ICD-10-CM | POA: Insufficient documentation

## 2023-12-24 DIAGNOSIS — Z8719 Personal history of other diseases of the digestive system: Secondary | ICD-10-CM | POA: Insufficient documentation

## 2023-12-24 DIAGNOSIS — Z860101 Personal history of adenomatous and serrated colon polyps: Secondary | ICD-10-CM | POA: Insufficient documentation

## 2023-12-24 DIAGNOSIS — Z5112 Encounter for antineoplastic immunotherapy: Secondary | ICD-10-CM | POA: Insufficient documentation

## 2023-12-24 DIAGNOSIS — Z5189 Encounter for other specified aftercare: Secondary | ICD-10-CM | POA: Insufficient documentation

## 2023-12-24 DIAGNOSIS — C23 Malignant neoplasm of gallbladder: Secondary | ICD-10-CM | POA: Insufficient documentation

## 2023-12-24 DIAGNOSIS — I10 Essential (primary) hypertension: Secondary | ICD-10-CM | POA: Diagnosis not present

## 2023-12-24 DIAGNOSIS — F32A Depression, unspecified: Secondary | ICD-10-CM | POA: Diagnosis not present

## 2023-12-24 DIAGNOSIS — Z7952 Long term (current) use of systemic steroids: Secondary | ICD-10-CM | POA: Insufficient documentation

## 2023-12-24 DIAGNOSIS — G893 Neoplasm related pain (acute) (chronic): Secondary | ICD-10-CM | POA: Insufficient documentation

## 2023-12-24 DIAGNOSIS — Z5111 Encounter for antineoplastic chemotherapy: Secondary | ICD-10-CM | POA: Insufficient documentation

## 2023-12-24 DIAGNOSIS — C7801 Secondary malignant neoplasm of right lung: Secondary | ICD-10-CM | POA: Diagnosis not present

## 2023-12-24 DIAGNOSIS — K219 Gastro-esophageal reflux disease without esophagitis: Secondary | ICD-10-CM | POA: Diagnosis not present

## 2023-12-24 DIAGNOSIS — M549 Dorsalgia, unspecified: Secondary | ICD-10-CM | POA: Diagnosis not present

## 2023-12-24 DIAGNOSIS — T451X5A Adverse effect of antineoplastic and immunosuppressive drugs, initial encounter: Secondary | ICD-10-CM | POA: Diagnosis not present

## 2023-12-24 DIAGNOSIS — Z87891 Personal history of nicotine dependence: Secondary | ICD-10-CM | POA: Insufficient documentation

## 2023-12-24 DIAGNOSIS — Z79899 Other long term (current) drug therapy: Secondary | ICD-10-CM | POA: Insufficient documentation

## 2023-12-24 DIAGNOSIS — C7802 Secondary malignant neoplasm of left lung: Secondary | ICD-10-CM | POA: Diagnosis not present

## 2023-12-24 DIAGNOSIS — Z7982 Long term (current) use of aspirin: Secondary | ICD-10-CM | POA: Insufficient documentation

## 2023-12-24 MED ORDER — PROCHLORPERAZINE MALEATE 10 MG PO TABS: 10.0000 mg | ORAL_TABLET | Freq: Four times a day (QID) | ORAL | 1 refills | Status: DC | PRN

## 2023-12-24 MED ORDER — DEXAMETHASONE 4 MG PO TABS: ORAL_TABLET | ORAL | 1 refills | Status: DC

## 2023-12-24 MED ORDER — ONDANSETRON HCL 8 MG PO TABS
8.0000 mg | ORAL_TABLET | Freq: Three times a day (TID) | ORAL | 1 refills | Status: DC | PRN
Start: 2023-12-24 — End: 2024-05-28

## 2023-12-24 MED ORDER — MORPHINE SULFATE ER 15 MG PO TBCR: 15.0000 mg | EXTENDED_RELEASE_TABLET | Freq: Two times a day (BID) | ORAL | 0 refills | Status: DC

## 2023-12-24 MED ORDER — LIDOCAINE-PRILOCAINE 2.5-2.5 % EX CREA: TOPICAL_CREAM | CUTANEOUS | 3 refills | Status: DC

## 2023-12-24 NOTE — Progress Notes (Signed)
 START ON PATHWAY REGIMEN - Hepatobiliary     Cycles 1 through up to 8: A cycle is every 21 days:     Durvalumab      Gemcitabine      Cisplatin    Cycles 9 and beyond: A cycle is every 28 days:     Durvalumab   **Always confirm dose/schedule in your pharmacy ordering system**  Patient Characteristics: Cholangiocarcinoma, Unresectable/Metastatic, Systemic Therapy, First Line Hepatobiliary Disease Type: Cholangiocarcinoma Line of therapy: First Line Intent of Therapy: Non-Curative / Palliative Intent, Discussed with Patient

## 2023-12-24 NOTE — Assessment & Plan Note (Signed)
 Discussed prognosis and treatment goals with her and her family. Emphasized that the cancer is not curable but can be managed to extend life and control symptoms. The goal is to add 1 to 1.5 years to life expectancy with treatment. Discussed possibility of attending a family cruise next September if treatment is well-tolerated, indicating a focus on quality of life and meaningful activities. - Focus on maintaining quality of life and symptom management. - Plan treatments around significant life events, such as the family cruise.

## 2023-12-24 NOTE — Assessment & Plan Note (Signed)
 Constipation secondary to opioid use for pain management. Currently using Miralax  and stool softeners. Discussed importance of maintaining bowel regularity with ongoing opioid use. - Continue Miralax  and stool softeners to manage constipation. - Encourage dietary fiber intake and consider fiber supplements if needed.

## 2023-12-24 NOTE — Addendum Note (Signed)
 Addended by: Rhetta Cellar on: 12/24/2023 03:16 PM   Modules accepted: Orders

## 2023-12-24 NOTE — Assessment & Plan Note (Addendum)
 Please review oncology history for additional details and timeline of events.  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 Biopsy from the omentum confirmed adenocarcinoma, originating from the gallbladder.  Immunostains support diagnosis of gallbladder carcinoma.    PET scan is needed for further staging.  Request placed today.   Today discussed diagnosis, staging, prognosis, plan of care, treatment options.  Reviewed NCCN guidelines.   Given stage IV disease, all treatment options are palliative in nature and not curative in intent.  This was discussed with patient, her daughter and son-in-law who were accompanying.  They verbalized understanding.    Discussed the aggressive nature of stage IV gallbladder adenocarcinoma and overall poor prognosis.  Life expectancy with treatment is 1 to 1.5 years; without treatment, less than six months.   We will submit NGS panel testing on the specimen and also liquid biopsy for mutation analysis.  Plan is to proceed with palliative systemic chemoimmunotherapy with gemcitabine, cisplatin and durvalumab.  Will arrange chemo education for this followed by treatment initiation in the next 1 to 2 weeks.  Request placed for Port-A-Cath placement.  If there is a delay in getting port placed, we will proceed with first treatment using peripheral IV.  Discussed most common side effects with the regimen including nausea, fatigue, cytopenias, electrolyte imbalances, renal dysfunction, neuropathy, ringing in the ears, hearing loss, immune mediated side effects.  Further details will be provided at chemo education.  - Ensure hydration and nutrition are maintained during treatment.  I will plan to see her following chemotherapy education, with intent to begin treatments in the next 7 to 10 days.

## 2023-12-24 NOTE — Progress Notes (Signed)
 [facility]  ONCOLOGY CONSULT NOTE   PATIENT NAME: Janice Pace   MR#: [id number] DOB: 1945-12-09  DATE OF SERVICE: 12/24/2023   REFERRING PROVIDER  Hospital follow-up  Patient Care Team: Melene Sportsman as PCP - General (Physician Assistant) Pcp, No [name], DO (Family Medicine) [place], [facility] Vanetta Generous, RN as [facility] Care Management    CHIEF COMPLAINT/ PURPOSE OF CONSULTATION:   Newly diagnosed metastatic gallbladder adenocarcinoma.  ASSESSMENT & PLAN:   Janice Pace is a 78 y.o. 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  Adenocarcinoma of gallbladder [facility]) Please review oncology history for additional details and timeline of events.  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 Biopsy from the omentum confirmed adenocarcinoma, originating from the gallbladder.  Immunostains support diagnosis of gallbladder carcinoma.    PET scan is needed for further staging.  Request placed today.   Today discussed diagnosis, staging, prognosis, plan of care, treatment options.  Reviewed NCCN guidelines.   Given stage IV disease, all treatment options are palliative in nature and not curative in intent.  This was discussed with patient, her daughter and son-in-law who were accompanying.  They verbalized understanding.    Discussed the aggressive nature of stage IV gallbladder adenocarcinoma and overall poor prognosis.  Life expectancy with treatment is 1 to 1.5 years; without treatment, less than six months.   We will submit NGS panel testing on the specimen and also liquid biopsy for mutation analysis.  Plan is to proceed with palliative systemic  chemoimmunotherapy with gemcitabine, cisplatin and durvalumab.  Will arrange chemo education for this followed by treatment initiation in the next 1 to 2 weeks.  Request placed for Port-A-Cath placement.  If there is a delay in getting port placed, we will proceed with first treatment using peripheral IV.  Discussed most common side effects with the regimen including nausea, fatigue, cytopenias, electrolyte imbalances, renal dysfunction, neuropathy, ringing in the ears, hearing loss, immune mediated side effects.  Further details will be provided at chemo education.  - Ensure hydration and nutrition are maintained during treatment.  I will plan to see her following chemotherapy education, with intent to begin treatments in the next 7 to 10 days.  Cancer associated pain Chronic pain secondary to cancer, managed with oxycodone  10 mg every four hours. Discussed potential for addiction with oxycodone  but emphasized its necessity for pain management. Proposed adding long-acting morphine for consistent pain relief and reducing frequent oxycodone  use. She prefers pill form due to concerns about the patch's initial delay in effectiveness. Discussed risk of constipation with opioid use and need for stool softeners. - Prescribe long-acting morphine for consistent pain control. - Continue oxycodone  for breakthrough pain as needed. - Advise on the use of stool softeners to prevent constipation due to opioid use.  Constipation Constipation secondary to opioid use for pain management. Currently using Miralax  and stool softeners. Discussed importance of maintaining bowel regularity with ongoing opioid use. - Continue Miralax  and stool softeners to manage constipation. - Encourage dietary fiber intake and consider fiber supplements if needed.  Goals of care, counseling/discussion Discussed prognosis and treatment goals with her and her family. Emphasized that the cancer is not curable but can be managed to  extend life and control symptoms.  The goal is to add 1 to 1.5 years to life expectancy with treatment. Discussed possibility of attending a family cruise next September if treatment is well-tolerated, indicating a focus on quality of life and meaningful activities. - Focus on maintaining quality of life and symptom management. - Plan treatments around significant life events, such as the family cruise.   I reviewed lab results and outside records for this visit and discussed relevant results with the patient. Diagnosis, plan of care and treatment options were also discussed in detail with the patient. Opportunity provided to ask questions and answers provided to her apparent satisfaction. Provided instructions to call our clinic with any problems, questions or concerns prior to return visit. I recommended to continue follow-up with PCP and sub-specialists. She verbalized understanding and agreed with the plan. No barriers to learning was detected.  NCCN guidelines have been consulted in the planning of this patient's care.  Arlo Berber, MD  12/24/2023 12:56 PM  [facility] - A DEPT OF [facility] [address] [place] [address] Dept: [phone] Dept Fax: [phone]   HISTORY OF PRESENTING ILLNESS:   I have reviewed her chart and materials related to her cancer extensively and collaborated history with the patient. Summary of oncologic history is as follows:  ONCOLOGY HISTORY:  78 y.o. lady with past medical history of essential hypertension, osteoarthritis, COPD/asthma, GERD, depression, anxiety, hiatal hernia, chronic back pain presented to the ED on 12/14/2023 with complaints of worsening epigastric/abdominal pain over 2-3 weeks.  Routine blood work was unremarkable in the ER.  However CT abdomen and pelvis showed irregular appearance of the gallbladder with suggestion of capsular thickening along the fundus of the  gallbladder.  Gallbladder appeared inseparable from the adjacent portion of the liver.  Clinical picture was concerning for gallbladder carcinoma.  Mesenteric nodular thickening along the mid lower abdominal cavity indicating peritoneal carcinomatosis.  Soft tissue nodular mass in the left lower quadrant region of the pelvis measuring 3.6 x 2.7 cm, likely ovarian neoplastic lesion.  Multiple lung nodules in the lower lobes bilaterally, largest measuring 12 mm in the right lower lobe, concerning for pulmonary metastatic disease.  She was admitted to the hospital for further evaluation and management.  IR was consulted for peritoneal mass biopsy.  CA 19-9 was significant elevated at 14,368.  CEA was increased at 98.7, CA-125 was also increased at 91.3.  On 12/14/2023, MRI abdomen/MRCP showed thickened, contracted gallbladder with a heterogeneous mass arising from the gallbladder fundus, contiguous with the adjacent liver parenchyma of hepatic segment IVB measuring 3.5 x 2.8 cm. Multiple small rim enhancing lesions within the liver parenchyma. Enlarged, necrotic appearing portacaval and porta hepatis lymph nodes. Small, although rim enhancing and abnormal appearing retroperitoneal lymph nodes. Peritoneal stranding and nodularity, incompletely visualized on this examination although present in the left upper quadrant, consistent with peritoneal metastatic disease. Numerous small bilateral pulmonary nodules, better assessed by CT. 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  On 12/15/2023, she underwent CT-guided biopsy of the omental lesion.  Pathology showed metastatic well to moderately differentiated adenocarcinoma. The tumor is positive for cytokeratin 7 and shows focal weak positivity for the GI marker CDX2.  The tumor is negative for the GI markers cytokeratin 20.  The tumor is also negative for the GU and GYN marker PAX8.  The tumor is negative for  the pulmonary adeno marker TTF-1. This immunohistochemical pattern and  histomorphology would be compatible with the clinical suspicion of a gallbladder primary.  The differential diagnosis would also include an upper GI or pancreaticobiliary primary.   Patient presented to our clinic on 12/24/2023 to establish care with us .  Request placed for staging PET scan.  NGS panel testing requested on the specimen.  Will also obtain liquid biopsy.  Plan for palliative systemic chemoimmunotherapy with cisplatin, gemcitabine, durvalumab, pending chemo education.  First dose can be given through peripheral IV, if Port-A-Cath cannot be placed in the meantime.  Oncology History  Adenocarcinoma of gallbladder (HCC)  12/24/2023 Initial Diagnosis   Adenocarcinoma of gallbladder (HCC)   12/24/2023 Cancer Staging   Staging form: Gallbladder, AJCC 8th Edition - Clinical: Stage IVB (cTX, cNX, pM1) - Signed by Arlo Berber, MD on 12/24/2023 Histologic grade (G): G2 Histologic grading system: 3 grade system   12/31/2023 -  Chemotherapy   Patient is on Treatment Plan : BILIARY TRACT Cisplatin + Gemcitabine D1,8 + Durvalumab (1500) D1 q21d / Durvalumab (1500) q28d       INTERVAL HISTORY:  Discussed the use of AI scribe software for clinical note transcription with the patient, who gave verbal consent to proceed.  She is experiencing ongoing pain management issues related to her gallbladder cancer. She takes oxycodone  10 mg every four hours for pain relief, as it starts wearing off after that duration. She is concerned about the addictive potential of oxycodone  but continues to use it due to her pain levels.  She was recently hospitalized where she received a platelet transfusion and underwent diagnostic imaging. An MRI of the abdomen showed abnormalities in the gallbladder area, with findings involving the liver and lung nodules. Biopsies from the omentum confirmed adenocarcinoma, and additional tests are being  conducted to determine the origin.  She has a history of COPD and undergoes annual lung cancer screenings. Her last screening in June 2024 did not show lung nodules, suggesting recent development of these lesions. She also has a history of multiple back surgeries, which may contribute to her current pain.  She experiences nausea and has been prescribed medication for it. She also uses bands for nausea relief. She rests more frequently due to not feeling well and experiences nausea when moving around.  She takes Miralax  daily for constipation, which she attributes to her pain medication use. She also uses stool softeners and laxatives as needed to maintain bowel movements.  Her social history includes a preference for drinking Diet Pepsi over water, which may contribute to dehydration. She is open to trying Pedialyte for hydration.  MEDICAL HISTORY:  Past Medical History:  Diagnosis Date   Arthritis    Asthma    Bronchitis    Cancer (HCC)    dermatosivros arcoma and protuverans   Carpal tunnel syndrome, left upper limb 11/06/2017   Chronic back pain    degenerative disc disease   Constipation    stool softener daily   Constipation    COPD (chronic obstructive pulmonary disease) (HCC)    GERD (gastroesophageal reflux disease)    takes Prilosec daily   GERD (gastroesophageal reflux disease)    Heart murmur    Heart valve disorder    Hemorrhoids    History of hiatal hernia    Ileus (HCC) 03/02/2021   Insomnia    d/t chantix    Joint pain    Nocturia    Ovarian failure 05/28/2022   Personal history of colonic adenoma 03/13/2003   03/13/2003 - 5 mm adenoma   Pre-diabetes  Prediabetes    White coat syndrome without diagnosis of hypertension     SURGICAL HISTORY: Past Surgical History:  Procedure Laterality Date   ABDOMINAL HYSTERECTOMY     ANTERIOR LAT LUMBAR FUSION N/A 02/27/2021   Procedure: Anterior lateral interbody fusion - lateral two - lateral three - lateral three -  lateral four with exploration fusion L4-S1;  Surgeon: Gearl Keens, MD;  Location: [facility] OR;  Service: Neurosurgery;  Laterality: N/A;   BACK SURGERY  1988   BACK SURGERY  09/03/2011   rods,screws x 8    BACK SURGERY     09/2022   BUNIONECTOMY     bilateral   BUNIONECTOMY     bil feet   COLONOSCOPY     dermatosibrosarcoma protuberans     LAMINECTOMY WITH POSTERIOR LATERAL ARTHRODESIS LEVEL 3 N/A 02/27/2021   Procedure: Posterior augmentation with pedicle screws Latreal one - lateral four - Posterior Lateral and Interbody fusion;  Surgeon: Gearl Keens, MD;  Location: [facility] OR;  Service: Neurosurgery;  Laterality: N/A;   PARTIAL HYSTERECTOMY     RECTOCELE REPAIR     Rectum Repair     RESECTION TUMOR WRIST RADICAL     x 2 rt   TUBAL LIGATION  1974   TUBAL LIGATION     WRIST SURGERY     left    SOCIAL HISTORY: Social History   Socioeconomic History   Marital status: Married    Spouse name: david   Number of children: 3   Years of education: Not on file   Highest education level: GED or equivalent  Occupational History   Occupation: retired  Tobacco Use   Smoking status: Former    Current packs/day: 0.00    Average packs/day: 1 pack/day for 50.0 years (50.0 ttl pk-yrs)    Types: Cigarettes    Start date: 08/16/1961    Quit date: 08/17/2011    Years since quitting: 12.3    Passive exposure: Never   Smokeless tobacco: Never  Vaping Use   Vaping status: Never Used  Substance and Sexual Activity   Alcohol use: No    Comment: occasional wine   Drug use: No   Sexual activity: Yes    Birth control/protection: Surgical, Other-see comments  Other Topics Concern   Not on file  Social History Narrative   ** Merged History Encounter **       Social Drivers of Health   Financial Resource Strain: Low Risk  (08/25/2023)   Overall Financial Resource Strain (CARDIA)    Difficulty of Paying Living Expenses: Not hard at all  Food Insecurity: No Food Insecurity (12/24/2023)   Hunger Vital  Sign    Worried About Running Out of Food in the Last Year: Never true    Ran Out of Food in the Last Year: Never true  Transportation Needs: No Transportation Needs (12/24/2023)   PRAPARE - Administrator, Civil Service (Medical): No    Lack of Transportation (Non-Medical): No  Physical Activity: Inactive (08/25/2023)   Exercise Vital Sign    Days of Exercise per Week: 0 days    Minutes of Exercise per Session: 0 min  Stress: No Stress Concern Present (08/25/2023)   [employer] of Occupational Health - Occupational Stress Questionnaire    Feeling of Stress : Not at all  Social Connections: Socially Integrated (12/14/2023)   Social Connection and Isolation Panel    Frequency of Communication with Friends and Family: More than three times a week  Frequency of Social Gatherings with Friends and Family: Once a week    Attends Religious Services: More than 4 times per year    Active Member of [employer] or Organizations: Yes    Attends Engineer, structural: More than 4 times per year    Marital Status: Married  Catering manager Violence: Not At Risk (12/24/2023)   Humiliation, Afraid, Rape, and Kick questionnaire    Fear of Current or Ex-Partner: No    Emotionally Abused: No    Physically Abused: No    Sexually Abused: No    FAMILY HISTORY: Family History  Problem Relation Age of Onset   Leukemia Mother    Anesthesia problems Neg Hx    Hypotension Neg Hx    Malignant hyperthermia Neg Hx    Pseudochol deficiency Neg Hx    Colon cancer Neg Hx    Colon polyps Neg Hx    Esophageal cancer Neg Hx    Stomach cancer Neg Hx    Rectal cancer Neg Hx     ALLERGIES:  She is allergic to sulfa antibiotics, forteo [parathyroid hormone (recomb)], atorvastatin, and metformin hcl.  MEDICATIONS:  Current Outpatient Medications  Medication Sig Dispense Refill   albuterol  (VENTOLIN  HFA) 108 (90 Base) MCG/ACT inhaler Inhale 1-2 puffs into the lungs every 6 (six) hours as  needed for wheezing or shortness of breath. 8 g 1   aspirin  81 MG EC tablet Take 81 mg by mouth daily.     budesonide  (PULMICORT ) 0.5 MG/2ML nebulizer solution Generic for Pulmicort . Inhale one vial in nebulizer twice a day. Rinse mouth after use. (Patient taking differently: Take 0.5 mg by nebulization 2 (two) times daily.) 60 mL 11   chlordiazePOXIDE  (LIBRIUM ) 10 MG capsule Take 1 capsule (10 mg total) by mouth as needed for anxiety (max of twice daily). 60 capsule 0   docusate sodium  (COLACE) 100 MG capsule Take 500 mg by mouth at bedtime.     formoterol  (PERFOROMIST ) 20 MCG/2ML nebulizer solution Substituted for: Perforomist  Solution Inhale one vial in nebulizer twice a day. 60 mL 11   metoprolol  succinate (TOPROL -XL) 50 MG 24 hr tablet TAKE 1 TABLET BY MOUTH ONCE DAILY. TAKE WITH OR IMMEDIATELY FOLLOWING A MEAL 90 tablet 1   morphine (MS CONTIN) 15 MG 12 hr tablet Take 1 tablet (15 mg total) by mouth every 12 (twelve) hours. 60 tablet 0   NON FORMULARY Take 8 oz by mouth See admin instructions. Mushroom coffee: Drink 8-10 ounces by mouth once a day as needed to aid bowel movements     omeprazole  (PRILOSEC) 20 MG capsule Take 1 capsule (20 mg total) by mouth 2 (two) times daily before a meal. 60 capsule 3   ondansetron  (ZOFRAN ) 4 MG tablet Take 1 tablet (4 mg total) by mouth every 6 (six) hours as needed for nausea. 30 tablet 0   oxyCODONE  10 MG TABS Take 1-1.5 tablets (10-15 mg total) by mouth every 4 (four) hours as needed for moderate pain (pain score 4-6) or severe pain (pain score 7-10). 60 tablet 0   revefenacin  (YUPELRI ) 175 MCG/3ML nebulizer solution Take 3 mLs (175 mcg total) by nebulization daily.     rosuvastatin  (CRESTOR ) 10 MG tablet Take 1 tablet (10 mg total) by mouth daily. 90 tablet 1   dexamethasone  (DECADRON ) 4 MG tablet Take 2 tablets (8 mg) by mouth daily x 3 days starting the day after cisplatin chemotherapy. Take with food. 30 tablet 1   lidocaine -prilocaine (EMLA) cream  Apply  to affected area once 30 g 3   ondansetron  (ZOFRAN ) 8 MG tablet Take 1 tablet (8 mg total) by mouth every 8 (eight) hours as needed for nausea or vomiting. Start on the third day after cisplatin. 30 tablet 1   prochlorperazine (COMPAZINE) 10 MG tablet Take 1 tablet (10 mg total) by mouth every 6 (six) hours as needed (Nausea or vomiting). 30 tablet 1   No current facility-administered medications for this visit.    REVIEW OF SYSTEMS:    Review of Systems - Oncology  All other pertinent systems were reviewed with the patient and are negative.  PHYSICAL EXAMINATION:   Onc Performance Status - 12/24/23 0925       ECOG Perf Status   ECOG Perf Status Ambulatory and capable of all selfcare but unable to carry out any work activities.  Up and about more than 50% of waking hours      KPS SCALE   KPS % SCORE Cares for self, unable to carry on normal activity or to do active work          Vitals:   12/24/23 0918  BP: (!) 143/83  Pulse: 71  Resp: 17  Temp: 97.6 F (36.4 C)  SpO2: 99%   Filed Weights   12/24/23 0918  Weight: 163 lb 11.2 oz (74.3 kg)    Physical Exam Constitutional:      General: She is not in acute distress.    Appearance: Normal appearance. She is well-developed.  HENT:     Head: Normocephalic and atraumatic.     Mouth/Throat:     Mouth: Mucous membranes are moist.   Eyes:     General: No scleral icterus.    Conjunctiva/sclera: Conjunctivae normal.    Cardiovascular:     Rate and Rhythm: Normal rate and regular rhythm.     Heart sounds: Normal heart sounds.  Pulmonary:     Effort: Pulmonary effort is normal. No respiratory distress.     Breath sounds: Normal breath sounds.  Abdominal:     Palpations: Abdomen is soft.     Tenderness: There is abdominal tenderness (on mild palpation in RUQ/ epigastric region).   Musculoskeletal:     Right lower leg: No edema.     Left lower leg: No edema.  Lymphadenopathy:     Cervical: No cervical  adenopathy.   Neurological:     General: No focal deficit present.     Mental Status: She is alert and oriented to person, place, and time.   Psychiatric:        Mood and Affect: Mood normal.        Behavior: Behavior normal.      LABORATORY DATA:   I have reviewed the data as listed.   Results for orders placed or performed in visit on 12/22/23 (from the past 72 hours)  CBC w/Diff     Status: None   Collection Time: 12/22/23  9:38 AM  Result Value Ref Range   WBC 7.1 3.4 - 10.8 x10E3/uL   RBC 5.17 3.77 - 5.28 x10E6/uL   Hemoglobin 15.2 11.1 - 15.9 g/dL   Hematocrit 54.0 98.1 - 46.6 %   MCV 89 79 - 97 fL   MCH 29.4 26.6 - 33.0 pg   MCHC 33.2 31.5 - 35.7 g/dL   RDW 19.1 47.8 - 29.5 %   Platelets 292 150 - 450 x10E3/uL   Neutrophils 78 Not Estab. %   Lymphs 15 Not Estab. %   Monocytes  7 Not Estab. %   Eos 0 Not Estab. %   Basos 0 Not Estab. %   Neutrophils Absolute 5.5 1.4 - 7.0 x10E3/uL   Lymphocytes Absolute 1.1 0.7 - 3.1 x10E3/uL   Monocytes Absolute 0.5 0.1 - 0.9 x10E3/uL   EOS (ABSOLUTE) 0.0 0.0 - 0.4 x10E3/uL   Basophils Absolute 0.0 0.0 - 0.2 x10E3/uL   Immature Granulocytes 0 Not Estab. %   Immature Grans (Abs) 0.0 0.0 - 0.1 x10E3/uL  Comp Met (CMET)     Status: Abnormal   Collection Time: 12/22/23  9:38 AM  Result Value Ref Range   Glucose 141 (H) 70 - 99 mg/dL   BUN 10 8 - 27 mg/dL   Creatinine, Ser 9.56 0.57 - 1.00 mg/dL   eGFR 67 >21 HY/QMV/7.84   BUN/Creatinine Ratio 11 (L) 12 - 28   Sodium 140 134 - 144 mmol/L   Potassium 4.4 3.5 - 5.2 mmol/L   Chloride 102 96 - 106 mmol/L   CO2 18 (L) 20 - 29 mmol/L   Calcium  10.2 8.7 - 10.3 mg/dL   Total Protein 7.2 6.0 - 8.5 g/dL   Albumin  4.7 3.8 - 4.8 g/dL   Globulin, Total 2.5 1.5 - 4.5 g/dL   Bilirubin Total 0.9 0.0 - 1.2 mg/dL   Alkaline Phosphatase 112 44 - 121 IU/L   AST 25 0 - 40 IU/L   ALT 22 0 - 32 IU/L  Lipid Profile     Status: Abnormal   Collection Time: 12/22/23  9:38 AM  Result Value Ref  Range   Cholesterol, Total 122 100 - 199 mg/dL   Triglycerides 696 0 - 149 mg/dL   HDL 39 (L) >29 mg/dL   VLDL Cholesterol Cal 23 5 - 40 mg/dL   LDL Chol Calc (NIH) 60 0 - 99 mg/dL   Chol/HDL Ratio 3.1 0.0 - 4.4 ratio    Comment:                                   T. Chol/HDL Ratio                                             Men  Women                               1/2 Avg.Risk  3.4    3.3                                   Avg.Risk  5.0    4.4                                2X Avg.Risk  9.6    7.1                                3X Avg.Risk 23.4   11.0   HgB A1c     Status: None   Collection Time: 12/22/23  9:38 AM  Result Value Ref Range   Hgb A1c MFr Bld 5.6 4.8 - 5.6 %  Comment:          Prediabetes: 5.7 - 6.4          Diabetes: >6.4          Glycemic control for adults with diabetes: <7.0    Est. average glucose Bld gHb Est-mCnc 114 mg/dL  Vitamin D  (25 hydroxy)     Status: None   Collection Time: 12/22/23  9:38 AM  Result Value Ref Range   Vit D, 25-Hydroxy 38.1 30.0 - 100.0 ng/mL    Comment: Vitamin D  deficiency has been defined by the Institute of Medicine and an Endocrine Society practice guideline as a level of serum 25-OH vitamin D  less than 20 ng/mL (1,2). The Endocrine Society went on to further define vitamin D  insufficiency as a level between 21 and 29 ng/mL (2). 1. IOM (Institute of Medicine). 2010. Dietary reference    intakes for calcium  and D. [place]: The    [employer]. 2. Holick MF, Binkley [place], Bischoff-Ferrari HA, et al.    Evaluation, treatment, and prevention of vitamin D     deficiency: an Endocrine Society clinical practice    guideline. JCEM. 2011 Jul; 96(7):1911-30.   Hepatitis C Antibody     Status: None   Collection Time: 12/22/23  9:38 AM  Result Value Ref Range   Hep C Virus Ab Non Reactive Non Reactive    Comment: HCV antibody alone does not differentiate between previously resolved infection and active infection.  Equivocal and Reactive HCV antibody results should be followed up with an HCV RNA test to support the diagnosis of active HCV infection.        RADIOGRAPHIC STUDIES:  I have personally reviewed the radiological images as listed and agree with the findings in the report.  CT ABDOMINAL MASS BIOPSY Result Date: 12/15/2023 INDICATION: Pelvic mass EXAM: CT-guided core needle biopsy pelvic mass TECHNIQUE: Multidetector CT imaging of the pelvis was performed following the standard protocol without IV contrast. RADIATION DOSE REDUCTION: This exam was performed according to the departmental dose-optimization program which includes automated exposure control, adjustment of the mA and/or kV according to patient size and/or use of iterative reconstruction technique. MEDICATIONS: None. ANESTHESIA/SEDATION: Moderate (conscious) sedation was employed during this procedure. A total of Versed  1 mg and Fentanyl  100 mcg was administered intravenously by the radiology nurse. Total intra-service moderate Sedation Time: 14 minutes. The patient's level of consciousness and vital signs were monitored continuously by radiology nursing throughout the procedure under my direct supervision. COMPLICATIONS: None immediate. PROCEDURE: Informed written consent was obtained from the patient after a thorough discussion of the procedural risks, benefits and alternatives. All questions were addressed. Maximal Sterile Barrier Technique was utilized including caps, mask, sterile gowns, sterile gloves, sterile drape, hand hygiene and skin antiseptic. A timeout was performed prior to the initiation of the procedure. The patient was placed on the CT gantry in a supine position. Radiopaque markers were placed on the patient's skin near the target lesion. The the patient's pelvis was then imaged, measurements were obtained, and the patient's skin was marked for the best trajectory for percutaneous biopsy. Local anesthesia was achieved with 1%  lidocaine  by infiltrative subcutaneous tissue to the level of the abdominal rectus with 1% lidocaine . A stab incision was then created and the biopsy trocar was advanced to the level of the rectus. Further imaging was obtained. The needle was then removed and the biopsy BioPince needle was advanced through the cannula and a biopsy sample was obtained. Further imaging demonstrated  no significant hemorrhage. A second core needle biopsy was obtained, but minimal material returned. All needles were removed the patient. Sterile dressing was applied. IMPRESSION: Successful core needle biopsy of an omental mass in the midline in the patient's pelvis. 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 Heavily T2-weighted images of the biliary and pancreatic ducts were obtained, and three-dimensional MRCP images were rendered by post processing. CONTRAST:  7mL GADAVIST  GADOBUTROL  1 MMOL/ML IV SOLN COMPARISON:  CT abdomen pelvis, 12/14/2023 FINDINGS: Lower chest: No acute abnormality. Numerous small bilateral pulmonary nodules, better assessed by CT (series 14, image 5). Hepatobiliary: Thickened, contracted gallbladder with a heterogeneous mass arising from the gallbladder fundus, contiguous with the adjacent liver parenchyma of hepatic segment IVB measuring 3.5 x 2.8 cm (series 14, image 37). Multiple small rim enhancing lesions within the liver parenchyma, for example in anterior hepatic segment II measuring 1.2 x 1.1 cm (series 14, image 19). Pancreas: Unremarkable. No pancreatic ductal dilatation or  surrounding inflammatory changes. Spleen: Normal in size without significant abnormality. Adrenals/Urinary Tract: Unchanged, definitively benign macroscopic fat containing adrenal adenomata requiring no specific further follow-up or characterization. Kidneys are normal, without obvious renal calculi, solid lesion, or hydronephrosis. Stomach/Bowel: Stomach is within normal limits. No evidence of bowel wall thickening, distention, or inflammatory changes. Vascular/Lymphatic: Aortic atherosclerosis. Enlarged, necrotic appearing portacaval and porta hepatis lymph nodes measuring up 2 2.9 x 1.6 cm (series 14, image 31). Small, although rim enhancing and abnormal appearing retroperitoneal lymph nodes (series 14, image 50). Other: No abdominal wall hernia or abnormality. No ascites. Peritoneal stranding and nodularity, incompletely visualized on this examination although present in the left upper quadrant (series 14, image 29). Musculoskeletal: No acute or significant osseous findings. IMPRESSION: 1. Thickened, contracted gallbladder with a heterogeneous mass arising from the gallbladder fundus, contiguous with the adjacent liver parenchyma of hepatic segment IVB measuring 3.5 x 2.8 cm. 2. Multiple small rim enhancing lesions within the liver parenchyma. 3. Enlarged, necrotic appearing portacaval and porta hepatis lymph nodes. Small, although rim enhancing and abnormal appearing retroperitoneal lymph nodes. 4. Peritoneal stranding and nodularity, incompletely visualized on this examination although present in the left upper quadrant, consistent with peritoneal metastatic disease. 5. Numerous small bilateral pulmonary nodules, better assessed by CT. 6. 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 Aortic Atherosclerosis (ICD10-I70.0). Electronically Signed   By: Fredricka Jenny M.D.   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 Heavily T2-weighted images of the biliary and pancreatic ducts were obtained, and three-dimensional MRCP images were rendered by post processing. CONTRAST:  7mL GADAVIST  GADOBUTROL  1 MMOL/ML IV SOLN COMPARISON:  CT abdomen pelvis, 12/14/2023 FINDINGS: Lower chest: No acute abnormality. Numerous small bilateral pulmonary nodules, better assessed by CT (series 14, image 5). Hepatobiliary: Thickened, contracted gallbladder with a heterogeneous mass arising from the gallbladder  fundus, contiguous with the adjacent liver parenchyma of hepatic segment IVB measuring 3.5 x 2.8 cm (series 14, image 37). Multiple small rim enhancing lesions within the liver parenchyma, for example in anterior hepatic segment II measuring 1.2 x 1.1 cm (series 14, image 19). Pancreas: Unremarkable. No pancreatic ductal dilatation or surrounding inflammatory changes. Spleen: Normal in size without significant abnormality. Adrenals/Urinary Tract: Unchanged, definitively benign macroscopic fat containing adrenal adenomata requiring no specific further follow-up or characterization. Kidneys are normal, without obvious renal calculi, solid lesion, or hydronephrosis. Stomach/Bowel: Stomach is within normal limits. No evidence of bowel wall thickening, distention, or inflammatory changes. Vascular/Lymphatic: Aortic atherosclerosis. Enlarged, necrotic appearing portacaval and porta hepatis lymph nodes measuring up 2 2.9 x 1.6 cm (series 14, image 31). Small, although rim enhancing and abnormal appearing retroperitoneal lymph nodes (series 14, image 50). Other: No abdominal wall hernia or abnormality. No ascites. Peritoneal  stranding and nodularity, incompletely visualized on this examination although present in the left upper quadrant (series 14, image 29). Musculoskeletal: No acute or significant osseous findings. IMPRESSION: 1. Thickened, contracted gallbladder with a heterogeneous mass arising from the gallbladder fundus, contiguous with the adjacent liver parenchyma of hepatic segment IVB measuring 3.5 x 2.8 cm. 2. Multiple small rim enhancing lesions within the liver parenchyma. 3. Enlarged, necrotic appearing portacaval and porta hepatis lymph nodes. Small, although rim enhancing and abnormal appearing retroperitoneal lymph nodes. 4. Peritoneal stranding and nodularity, incompletely visualized on this examination although present in the left upper quadrant, consistent with peritoneal metastatic disease. 5. Numerous small bilateral pulmonary nodules, better assessed by CT. 6. 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 Aortic Atherosclerosis (ICD10-I70.0). Electronically Signed   By: Fredricka Jenny M.D.   On: 12/14/2023 21:08   CT ABDOMEN PELVIS W CONTRAST Result Date: 12/14/2023 CLINICAL DATA:  Right upper quadrant abdominal pain EXAM: CT ABDOMEN AND PELVIS WITH CONTRAST TECHNIQUE: Multidetector CT imaging of the abdomen and pelvis was performed using the standard protocol following bolus administration of intravenous contrast. RADIATION DOSE REDUCTION: This exam was performed according to the departmental dose-optimization program which includes automated exposure control, adjustment of the mA and/or kV according to patient size and/or use of iterative reconstruction technique. CONTRAST:  OMNIPAQUE  IOHEXOL  300 MG/ML  SOLN COMPARISON:  Nov 19, 2022 FINDINGS: Lower chest: Several nodules in both lower lobes are identified, measuring between 3 and 4 mm in the right lower lobe and 3 and 5 mm in the lingula, as well as multiple nodules in the left lower lobe  posteromedial segment the largest 9 mm in diameter on image number 8. And in the right lower lobe image number 25 measuring 12 mm in diameter. These nodules were not identified on the prior CT from 2024 there are 4 suspicious for metastatic lung disease. Hepatobiliary: Liver appears normal in size no parenchymal lesions however, gallbladder appears irregular, along the fundus of the gallbladder with a suggestion of capsular thickening measuring 2.4 x 0.9 cm. In addition the gallbladder appears inseparable from the adjacent portion of the liver, the possibility of gallbladder carcinoma should be considered and excluded clinically. Correlation with recent ultrasound demonstrates poor visualization of the gallbladder. The findings are not typical for acute cholecystitis in the CT images. Correlation with MR and MRCP is recommended. Pancreas: Unremarkable. No pancreatic ductal dilatation or surrounding inflammatory changes. Spleen: Normal in size without focal abnormality. Adrenals/Urinary Tract: Adrenal glands are unremarkable. Kidneys are normal, without renal calculi, focal lesion, or hydronephrosis. Bladder is unremarkable.  Stomach/Bowel: There is mesenteric nodular thickening along the mid lower abdominal cavity better seen on images 56-60 and may suggest peritoneal carcinomatosis. Soft tissue nodular masses noted in the left lower quadrant region of the pelvis on image number 65 measuring 3.6 x 2.7 cm this could correlate with an ovarian neoplastic lesion. Again not present on prior examination 2024 Reproductive: Prior hysterectomy Other: No abdominal wall hernia or abnormality. No abdominopelvic ascites. Musculoskeletal: Extensive postsurgical changes of the thoracolumbar spine with intervertebral disc replacements L2-L3 L4 and L5. IMPRESSION: *Multiple pulmonary nodules in the lower lobes bilaterally suspicious for metastatic lung disease. *Irregular appearance of the gallbladder with a suggestion of capsular  thickening along the fundus of the gallbladder. In addition the gallbladder appears inseparable from the adjacent portion of the liver, the possibility of gallbladder carcinoma should be considered and excluded clinically. Correlation with recent ultrasound demonstrates poor visualization of the gallbladder. The findings are not typical for acute cholecystitis in the CT images. Correlation with MR and MRCP is recommended. *Mesenteric nodular thickening along the mid lower abdominal cavity may suggest peritoneal carcinomatosis. *Soft tissue nodular mass in the left lower quadrant region of the pelvis measuring 3.6 x 2.7 cm this could correlate with an ovarian neoplastic lesion. Again not present on prior examination 2024. *Extensive postsurgical changes of the thoracolumbar spine with intervertebral disc replacements L2-L3 L4 and L5. Electronically Signed   By: Fredrich Jefferson M.D.   On: 12/14/2023 15:18   US  Abdomen Limited RUQ (LIVER/GB) Result Date: 12/14/2023 CLINICAL DATA:  [id number] RUQ pain 151471 EXAM: ULTRASOUND ABDOMEN LIMITED RIGHT UPPER QUADRANT COMPARISON:  Nov 19, 2022 FINDINGS: Gallbladder: The gallbladder was not well visualized or evaluated. The sonographer reports right upper quadrant pain in this region during the exam. Common bile duct: Diameter: 4 mm Liver: Normal echogenicity. No focal lesion identified. No intrahepatic biliary ductal dilation. Portal vein is patent on color Doppler imaging with normal direction of blood flow towards the liver. Other: None. IMPRESSION: 1. The gallbladder was not well visualized or evaluated during the exam; however, the sonographer reports right upper quadrant pain in this region. A follow-up CT of the abdomen and pelvis with IV contrast could be considered for further characterization. 2. No intrahepatic or extrahepatic biliary ductal dilation. Electronically Signed   By: Rance Burrows M.D.   On: 12/14/2023 13:05    Orders Placed This Encounter  Procedures    NM PET Image Initial (PI) Skull Base To Thigh    Standing Status:   Future    Expected Date:   12/28/2023    Expiration Date:   12/23/2024    If indicated for the ordered procedure, I authorize the administration of a radiopharmaceutical per Radiology protocol:   Yes    Preferred imaging location?:   [facility]    Standing Status:   Future    Expiration Date:   12/23/2024   CBC with Differential (Cancer Center Only)    Standing Status:   Future    Expected Date:   12/31/2023    Expiration Date:   12/30/2024   CMP (Cancer Center only)    Standing Status:   Future    Expected Date:   12/31/2023    Expiration Date:   12/30/2024   T4    Standing Status:   Future    Expected Date:   12/31/2023    Expiration Date:   12/30/2024   TSH    Standing Status:   Future  Expected Date:   12/31/2023    Expiration Date:   12/30/2024   Magnesium     Standing Status:   Future    Expected Date:   12/31/2023    Expiration Date:   12/30/2024   CBC with Differential (Cancer Center Only)    Standing Status:   Future    Expected Date:   01/07/2024    Expiration Date:   01/06/2025   CMP (Cancer Center only)    Standing Status:   Future    Expected Date:   01/07/2024    Expiration Date:   01/06/2025   Magnesium     Standing Status:   Future    Expected Date:   01/07/2024    Expiration Date:   01/06/2025   CBC with Differential (Cancer Center Only)    Standing Status:   Future    Expected Date:   01/21/2024    Expiration Date:   01/20/2025   CMP (Cancer Center only)    Standing Status:   Future    Expected Date:   01/21/2024    Expiration Date:   01/20/2025   Magnesium     Standing Status:   Future    Expected Date:   01/21/2024    Expiration Date:   01/20/2025   CBC with Differential (Cancer Center Only)    Standing Status:   Future    Expected Date:   01/28/2024    Expiration Date:   01/27/2025   CMP (Cancer Center only)    Standing Status:   Future    Expected Date:   01/28/2024    Expiration  Date:   01/27/2025   Magnesium     Standing Status:   Future    Expected Date:   01/28/2024    Expiration Date:   01/27/2025    CODE STATUS:  Code Status History     Date Active Date Inactive Code Status Order ID Comments User Context   12/14/2023 1601 12/16/2023 2004 Limited: Do not attempt resuscitation (DNR) -DNR-LIMITED -Do Not Intubate/DNI  [id number]  Maggie Schooner, MD ED   09/17/2022 1236 09/18/2022 1519 Full Code [id number]  Gearl Keens, MD Inpatient   02/27/2021 1602 03/09/2021 2325 Full Code [id number]  Gearl Keens, MD Inpatient   06/27/2020 1110 06/28/2020 0504 Full Code [id number]  Aundra Lee, MD HOV    Questions for Most Recent Historical Code Status (Order [id number])     Question Answer   If pulseless and not breathing No CPR or chest compressions.   In Pre-Arrest Conditions (Patient Is Breathing and Has A Pulse) Do not intubate. Provide all appropriate non-invasive medical interventions. Avoid ICU transfer unless indicated or required.   Consent: Discussion documented in EHR or advanced directives reviewed                Advance Directive Documentation    Flowsheet Row Most Recent Value  Type of Advance Directive Healthcare Power of Attorney, Living will  Pre-existing out of facility DNR order (yellow form or pink MOST form) --  MOST Form in Place? --    Future Appointments  Date Time Provider Department Center  12/25/2023 11:00 AM Vanetta Generous, RN [facility] None  01/08/2024 10:00 AM [facility] PET CT 1 [facility] [place]  03/04/2024  8:30 AM Wilfredo Hanly, MD [facility] None     I spent a total of 80 minutes during this encounter with the patient including review of chart and various tests results, discussions about plan of care and coordination of  care plan.  This document was completed utilizing speech recognition software. Grammatical errors, random word insertions, pronoun errors, and incomplete sentences are an occasional consequence of this system due to software  limitations, ambient noise, and hardware issues. Any formal questions or concerns about the content, text or information contained within the body of this dictation should be directly addressed to the provider for clarification.

## 2023-12-24 NOTE — Progress Notes (Signed)
 PATIENT NAVIGATOR PROGRESS NOTE  Name: Janice Pace Date: 12/24/2023 MRN: [id number]  DOB: 09-03-1945   Reason for visit:  New Patient Appt  Comments:  Patient seen in office during her new patient appt with Dr. Randye Buttner.  Patient was present with her daughter Corbin Dess) and husband Myrtie Atkinson).  SDOH were assessed. No urgent needs were identified.  Patient denied needing assistance for transportation or groceries.  Patient's husband drives patient to and from her appointments. Patient was given [facility] binder with information specific to her diagnosis.  Patient was also given my direct contact information and was instructed to contact office if she had any questions or concerns.  Patient verbalized understanding.   Referrals were entered for the following:  Nutrition Social Work  Orders entered for the following:  Port-a-cath placement [facility] testing  Scheduling message sent to schedule patient for chemo education class.   Will follow-up with patient next week.   Time spent counseling/coordinating care: > 60 minutes

## 2023-12-24 NOTE — Assessment & Plan Note (Signed)
 Chronic pain secondary to cancer, managed with oxycodone  10 mg every four hours. Discussed potential for addiction with oxycodone  but emphasized its necessity for pain management. Proposed adding long-acting morphine for consistent pain relief and reducing frequent oxycodone  use. She prefers pill form due to concerns about the patch's initial delay in effectiveness. Discussed risk of constipation with opioid use and need for stool softeners. - Prescribe long-acting morphine for consistent pain control. - Continue oxycodone  for breakthrough pain as needed. - Advise on the use of stool softeners to prevent constipation due to opioid use.

## 2023-12-25 ENCOUNTER — Encounter: Payer: Self-pay | Admitting: General Practice

## 2023-12-25 ENCOUNTER — Other Ambulatory Visit (HOSPITAL_COMMUNITY): Payer: Self-pay

## 2023-12-25 ENCOUNTER — Other Ambulatory Visit: Payer: Self-pay

## 2023-12-25 ENCOUNTER — Other Ambulatory Visit: Payer: Self-pay | Admitting: Oncology

## 2023-12-25 ENCOUNTER — Encounter: Payer: Self-pay | Admitting: Oncology

## 2023-12-25 ENCOUNTER — Other Ambulatory Visit

## 2023-12-25 ENCOUNTER — Inpatient Hospital Stay

## 2023-12-25 DIAGNOSIS — G893 Neoplasm related pain (acute) (chronic): Secondary | ICD-10-CM

## 2023-12-25 MED ORDER — MORPHINE SULFATE ER 15 MG PO TBCR
15.0000 mg | EXTENDED_RELEASE_TABLET | Freq: Two times a day (BID) | ORAL | 0 refills | Status: DC
  Filled 2023-12-25: qty 60, 30d supply, fill #0

## 2023-12-25 NOTE — Transitions of Care (Post Inpatient/ED Visit) (Signed)
 Transition of Care week 2  Visit Note  12/25/2023  Name: Janice Pace MRN: [id number]          DOB: 10/29/1945  Situation: Patient enrolled in [facility] 30-day program. Visit completed with patient by telephone.   Background: New DX of cancer  Initial Transition Care Management Follow-up Telephone Call    Past Medical History:  Diagnosis Date   Arthritis    Asthma    Bronchitis    Cancer (HCC)    dermatosivros arcoma and protuverans   Carpal tunnel syndrome, left upper limb 11/06/2017   Chronic back pain    degenerative disc disease   Constipation    stool softener daily   Constipation    COPD (chronic obstructive pulmonary disease) (HCC)    GERD (gastroesophageal reflux disease)    takes Prilosec daily   GERD (gastroesophageal reflux disease)    Heart murmur    Heart valve disorder    Hemorrhoids    History of hiatal hernia    Ileus (HCC) 03/02/2021   Insomnia    d/t chantix    Joint pain    Nocturia    Ovarian failure 05/28/2022   Personal history of colonic adenoma 03/13/2003   03/13/2003 - 5 mm adenoma   Pre-diabetes    Prediabetes    White coat syndrome without diagnosis of hypertension     Assessment: Reports that she continues to be constipated.  Taking Miralax . BM once a week.  Patient Reported Symptoms: Cognitive Cognitive Status: Able to follow simple commands, Alert and oriented to person, place, and time, Normal speech and language skills      Neurological Neurological Review of Symptoms: No symptoms reported    HEENT HEENT Symptoms Reported: No symptoms reported      Cardiovascular Cardiovascular Symptoms Reported: No symptoms reported    Respiratory Respiratory Symptoms Reported: No symptoms reported    Endocrine Patient reports the following symptoms related to hypoglycemia or hyperglycemia : No symptoms reported    Gastrointestinal Gastrointestinal Symptoms Reported: Abdominal pain or discomfort, Constipation, Nausea Additional  Gastrointestinal Details: Reports pain is decreased at this time. States she is taking the short acting oxycodone  and a schedule basis until she starts witht with the morphone Gastrointestinal Conditions: Constipation, Abdominal pain Gastrointestinal Management Strategies: Activity Gastrointestinal Comment: improved appetite.   drinking pedilyte., encoruaged patient to be active and drink and eat well.    Genitourinary Genitourinary Symptoms Reported: No symptoms reported    Integumentary Integumentary Symptoms Reported: No symptoms reported    Musculoskeletal Musculoskelatal Symptoms Reviewed: No symptoms reported        Psychosocial Additional Psychological Details: Reports that she is dealing well with the new diagnosis of cancer.  Reports that she is staying positive.         Today's Vitals   12/25/23 1049  PainSc: 0-No pain     Medications Reviewed Today     Reviewed by Vanetta Generous, RN (Registered Nurse) on 12/25/23 at 1114  Med List Status: <None>   Medication Order Taking? Sig Documenting Provider Last Dose Status Informant  albuterol  (VENTOLIN  HFA) 108 (90 Base) MCG/ACT inhaler [id number] Yes Inhale 1-2 puffs into the lungs every 6 (six) hours as needed for wheezing or shortness of breath. Roetta Clarke, NP  Active Self  aspirin  81 MG EC tablet [id number] Yes Take 81 mg by mouth daily. [provider]  Active Self  budesonide  (PULMICORT ) 0.5 MG/2ML nebulizer solution [id number] Yes Generic for Pulmicort . Inhale one vial in nebulizer twice  a day. Rinse mouth after use. Wilfredo Hanly, MD  Active Self  chlordiazePOXIDE  (LIBRIUM ) 10 MG capsule [id number] Yes Take 1 capsule (10 mg total) by mouth as needed for anxiety (max of twice daily). Unk Garb, DO  Active   dexamethasone  (DECADRON ) 4 MG tablet [id number] Yes Take 2 tablets (8 mg) by mouth daily x 3 days starting the day after cisplatin chemotherapy. Take with food. Pasam, Gale Jude, MD  Active   docusate  sodium (COLACE) 100 MG capsule [id number] Yes Take 500 mg by mouth at bedtime. [provider]  Active Self  formoterol  (PERFOROMIST ) 20 MCG/2ML nebulizer solution [id number] Yes Substituted for: Perforomist  Solution Inhale one vial in nebulizer twice a day. Wilfredo Hanly, MD  Active Self  lidocaine -prilocaine  (EMLA ) cream [id number] Yes Apply to affected area once Pasam, Avinash, MD  Active   metoprolol  succinate (TOPROL -XL) 50 MG 24 hr tablet [id number] Yes TAKE 1 TABLET BY MOUTH ONCE DAILY. TAKE WITH OR IMMEDIATELY FOLLOWING A MEAL Laneta Pintos, MD  Active Self  morphine  (MS CONTIN ) 15 MG 12 hr tablet [id number]  Take 1 tablet (15 mg total) by mouth every 12 (twelve) hours.  Patient not taking: Reported on 12/25/2023   Arlo Berber, MD  Active   NON FORMULARY [id number]  Take 8 oz by mouth See admin instructions. Mushroom coffee: Drink 8-10 ounces by mouth once a day as needed to aid bowel movements  Patient not taking: Reported on 12/25/2023   [provider]  Active Self  omeprazole  (PRILOSEC) 20 MG capsule [id number] Yes Take 1 capsule (20 mg total) by mouth 2 (two) times daily before a meal. Clapp, Kara F, PA-C  Active   ondansetron  (ZOFRAN ) 4 MG tablet [id number] Yes Take 1 tablet (4 mg total) by mouth every 6 (six) hours as needed for nausea. Unk Garb, DO  Active   ondansetron  (ZOFRAN ) 8 MG tablet [id number] Yes Take 1 tablet (8 mg total) by mouth every 8 (eight) hours as needed for nausea or vomiting. Start on the third day after cisplatin. Pasam, Gale Jude, MD  Active   oxyCODONE  10 MG TABS [id number] Yes Take 1-1.5 tablets (10-15 mg total) by mouth every 4 (four) hours as needed for moderate pain (pain score 4-6) or severe pain (pain score 7-10). Unk Garb, DO  Active   polyethylene glycol (MIRALAX  / GLYCOLAX ) 17 g packet [id number] Yes Take 17 g by mouth 2 (two) times daily. [provider]  Active   prochlorperazine  (COMPAZINE ) 10 MG tablet [id number] Yes Take 1  tablet (10 mg total) by mouth every 6 (six) hours as needed (Nausea or vomiting). Pasam, Gale Jude, MD  Active   revefenacin  (YUPELRI ) 175 MCG/3ML nebulizer solution [id number] Yes Take 3 mLs (175 mcg total) by nebulization daily. Wilfredo Hanly, MD  Active Self  rosuvastatin  (CRESTOR ) 10 MG tablet [id number] Yes Take 1 tablet (10 mg total) by mouth daily. Noreene Bearded, PA  Active Self            Recommendation:   Encouraged patient to contact oncology if she continues to have difficulty with getting morphine  prescription.   Follow Up Plan:   Telephone follow up appointment date/time:  12/31/2023 at 9am  Orpha Blade, RN, BSN, CEN Population Health- Transition of Care Team.  [facility] [phone]

## 2023-12-25 NOTE — Progress Notes (Signed)
 Pharmacist Chemotherapy Monitoring - Initial Assessment    Anticipated start date: 01/01/24   The following has been reviewed per standard work regarding the patient's treatment regimen: The patient's diagnosis, treatment plan and drug doses, and organ/hematologic function Lab orders and baseline tests specific to treatment regimen  The treatment plan start date, drug sequencing, and pre-medications Prior authorization status  Patient's documented medication list, including drug-drug interaction screen and prescriptions for anti-emetics and supportive care specific to the treatment regimen The drug concentrations, fluid compatibility, administration routes, and timing of the medications to be used The patient's access for treatment and lifetime cumulative dose history, if applicable  The patient's medication allergies and previous infusion related reactions, if applicable   Changes made to treatment plan:  N/A  Follow up needed:  N/A   Cherylynn Cosier, RPH, 12/25/2023  10:54 AM

## 2023-12-25 NOTE — Progress Notes (Signed)
 [facility] Clinical Social Work  Initial Assessment   Janice Pace is a 78 y.o. year old female contacted by phone. Clinical Social Work was referred by medical provider for assessment of psychosocial needs.   SDOH (Social Determinants of Health) assessments performed: Yes SDOH Interventions    Flowsheet Row Office Visit from 12/24/2023 in [facility] - A Dept Of [place] Telephone from 12/17/2023 in [place] POPULATION HEALTH DEPARTMENT Clinical Support from 06/03/2023 in [facility] at [facility]  SDOH Interventions     Food Insecurity Interventions Intervention Not Indicated Intervention Not Indicated Intervention Not Indicated  Housing Interventions Intervention Not Indicated Intervention Not Indicated Intervention Not Indicated  Transportation Interventions Intervention Not Indicated Intervention Not Indicated Intervention Not Indicated  Utilities Interventions Intervention Not Indicated Intervention Not Indicated Intervention Not Indicated  Alcohol Usage Interventions -- -- Intervention Not Indicated (Score <7)  Depression Interventions/Treatment  -- -- ZOX0-9 Score <4 Follow-up Not Indicated  Financial Strain Interventions -- -- Intervention Not Indicated  Physical Activity Interventions -- -- Other (Comments)  [since surgery]  Stress Interventions -- -- Intervention Not Indicated  Social Connections Interventions -- -- Intervention Not Indicated  Health Literacy Interventions -- -- Intervention Not Indicated    SDOH Screenings   Food Insecurity: No Food Insecurity (12/24/2023)  Housing: Unknown (12/24/2023)  Transportation Needs: No Transportation Needs (12/24/2023)  Utilities: Not At Risk (12/24/2023)  Alcohol Screen: Low Risk  (08/25/2023)  Depression (PHQ2-9): Low Risk  (12/24/2023)  Recent Concern: Depression (PHQ2-9) - Medium Risk (12/22/2023)  Financial Resource Strain: Low Risk  (08/25/2023)  Physical Activity: Inactive (08/25/2023)   Social Connections: Socially Integrated (12/14/2023)  Stress: No Stress Concern Present (08/25/2023)  Tobacco Use: Medium Risk (12/24/2023)  Health Literacy: Adequate Health Literacy (06/03/2023)     Distress Screen completed: No     No data to display            Family/Social Information:  Housing Arrangement: patient lives with her husband, Janice Pace. Family members/support persons in your life? Family and Church.  Patient three children.  A daughter lives close by, another daughter lives in [place], and her son lives in [place], [place]. Transportation concerns: no  Employment: Retired  Income source: Actor concerns: No Type of concern: None Food access concerns: no Religious or spiritual practice: Yes-Patient identifies as Methodist and receives support from her church. Advanced directives: Yes-In Mychart. Services Currently in place:  [facility] Medicare and [facility].  Coping/ Adjustment to diagnosis: Patient understands treatment plan and what happens next? yes Concerns about diagnosis and/or treatment: I'm not especially worried about anything Patient reported stressors: None per patient. Hopes and/or priorities: Family Patient enjoys crafts, reading, and time with family/ friends Current coping skills/ strengths: Active sense of humor , Average or above average intelligence , Capable of independent living , Communication skills , Contractor , General fund of knowledge , Motivation for treatment/growth , Religious Affiliation , Special hobby/interest , and Supportive family/friends     SUMMARY: Current SDOH Barriers:  None per patient.  Clinical Social Work Clinical Goal(s):  No clinical social work goals at this time  Interventions: Discussed common feeling and emotions when being diagnosed with cancer, and the importance of support during treatment Informed patient of the support team roles and support services at [facility] Provided CSW contact  information and encouraged patient to call with any questions or concerns Provided patient with information about the [employer].  Informed  her that Maudie Sorrow would be her primary CSW beginning next week.   Follow Up Plan: CSW will follow-up with patient by phone  Patient verbalizes understanding of plan: Yes    Kennth Peal, LCSW Clinical Social Worker [facility]

## 2023-12-25 NOTE — Patient Instructions (Signed)
 Visit Information  Thank you for taking time to visit with me today. Please don't hesitate to contact me if I can be of assistance to you before our next scheduled telephone appointment.  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  Patient with a positive attitude. Taking medications as prescribed and reports improved appetite.  Patient awaiting results of biopsy - ambulatory palliative referral noted and reviewed with patient - Oncology appointment scheduled 12/24/23 - (Dr Maryalice Smaller is out of the country so scheduled with Dr Pasam)12/25/2023  Results in and oncology reviewed with patient and family.   Goal:  Over the next 30 days, the patient will not experience hospital readmission  Interventions:  Transitions of Care: Doctor Visits  - discussed the importance of doctor visits, reviewed all pending appointments with patient on the phone. Patient was able to log into my chart while on the phone to see all her appointments. Reviewed with patient each appointment to ease her anxiety with scheduled appointments next week.  Discussed with patient that she could not get RX for morphine  at [facility] - due to being out of stock. Patient has requested RX to be sent to [facility] pharmacy.  Encouraged patient to call me if she needs assistance. She has already sent a message to the cancer center.    COPD Interventions:  Encouraged patient to continue to use her maintenance medications as prescribed.   Oncology: Reviewed treatment plan that patient understands.  Confirmed all appointments and her understanding of reasons for appointments.  Reviewed  causes of constipation and helpful hints:  increase hydration, be active and take OTC medications if needed.   Patient Self Care Activities:  Attend all scheduled provider appointments Call pharmacy for medication refills 3-7 days in advance of running out of medications Call provider office for new concerns or questions  Notify RN Care Manager of TOC call rescheduling needs Participate in Transition of Care Program/Attend TOC scheduled calls Take medications as prescribed   eat healthy and drink plenty of fluids Be as active as you can.  Rest when your body tells you too.  Keep up with when you have a BM  Plan:  Telephone follow up appointment with care management team member scheduled for:  12/31/23 09600 appointment scheduled with primary [facility] RN, Orpha Blade  The patient has been provided with contact information for the care management team and has been advised to call with any health related questions or concerns.         Our next appointment is by telephone on 12/31/2023 at 0900  Please call the care guide team at [phone] if you need to cancel or reschedule your appointment.   If you are experiencing a Mental Health or Behavioral Health Crisis or need someone to talk to, please call the Suicide and Crisis Lifeline: 988 call the [place]  National Suicide Prevention Lifeline: 818-563-4115 or TTY: 367-208-5200 TTY 684-832-0190) to talk to a trained counselor call 1-800-273-TALK (toll free, 24 hour hotline) call 911   Patient verbalizes understanding of instructions and care plan provided today and agrees to view  in MyChart. Active MyChart status and patient understanding of how to access instructions and care plan via MyChart confirmed with patient.     Orpha Blade, RN, BSN, CEN [employer]- Transition of Care Team.  [facility] [phone]

## 2023-12-25 NOTE — Progress Notes (Signed)
 [facility] Spiritual Care Note  Reached Ms Dobos by phone per referral from Nursing for additional layer of support. Ms Ramstad was appreciative of call and welcomes follow-up in person at her first infusion next week. She also has direct Spiritual Care number in case needs arise in the meantime.   [address] [name], [place], [facility] Pager [phone] Voicemail [phone]

## 2023-12-27 ENCOUNTER — Other Ambulatory Visit: Payer: Self-pay

## 2023-12-28 ENCOUNTER — Encounter
Admission: RE | Admit: 2023-12-28 | Discharge: 2023-12-28 | Disposition: A | Discharge status: Home / Self Care | Source: Ambulatory Visit | Attending: Oncology | Admitting: Oncology

## 2023-12-28 DIAGNOSIS — C23 Malignant neoplasm of gallbladder: Secondary | ICD-10-CM | POA: Diagnosis present

## 2023-12-28 LAB — GLUCOSE, CAPILLARY: Glucose-Capillary: 118 mg/dL — ABNORMAL HIGH (ref 70–99)

## 2023-12-28 MED ORDER — FLUDEOXYGLUCOSE F - 18 (FDG) INJECTION
8.4000 | Freq: Once | INTRAVENOUS | Status: AC | PRN
  Administered 2023-12-28: 9.05 via INTRAVENOUS

## 2023-12-30 ENCOUNTER — Inpatient Hospital Stay

## 2023-12-30 ENCOUNTER — Other Ambulatory Visit: Payer: Self-pay | Admitting: Oncology

## 2023-12-30 ENCOUNTER — Ambulatory Visit: Admitting: Oncology

## 2023-12-30 DIAGNOSIS — C23 Malignant neoplasm of gallbladder: Secondary | ICD-10-CM

## 2023-12-30 DIAGNOSIS — Z5112 Encounter for antineoplastic immunotherapy: Secondary | ICD-10-CM | POA: Diagnosis not present

## 2023-12-30 DIAGNOSIS — R300 Dysuria: Secondary | ICD-10-CM

## 2023-12-30 LAB — CMP (CANCER CENTER ONLY)
ALT: 18 U/L (ref 0–44)
AST: 20 U/L (ref 15–41)
Albumin: 4.1 g/dL (ref 3.5–5.0)
Alkaline Phosphatase: 80 U/L (ref 38–126)
Anion gap: 6 (ref 5–15)
BUN: 8 mg/dL (ref 8–23)
CO2: 28 mmol/L (ref 22–32)
Calcium: 9.4 mg/dL (ref 8.9–10.3)
Chloride: 107 mmol/L (ref 98–111)
Creatinine: 0.69 mg/dL (ref 0.44–1.00)
GFR, Estimated: >60 mL/min (ref >60)
Glucose, Bld: 115 mg/dL — ABNORMAL HIGH (ref 70–99)
Potassium: 4.2 mmol/L (ref 3.5–5.1)
Sodium: 141 mmol/L (ref 135–145)
Total Bilirubin: 0.8 mg/dL (ref 0.0–1.2)
Total Protein: 6.6 g/dL (ref 6.5–8.1)

## 2023-12-30 LAB — CBC WITH DIFFERENTIAL (CANCER CENTER ONLY)
Abs Immature Granulocytes: 0.02 10*3/uL (ref 0.00–0.07)
Basophils Absolute: 0 10*3/uL (ref 0.0–0.1)
Basophils Relative: 0 %
Eosinophils Absolute: 0.1 10*3/uL (ref 0.0–0.5)
Eosinophils Relative: 2 %
HCT: 38.7 % (ref 36.0–46.0)
Hemoglobin: 13.3 g/dL (ref 12.0–15.0)
Immature Granulocytes: 0 %
Lymphocytes Relative: 20 %
Lymphs Abs: 1.1 10*3/uL (ref 0.7–4.0)
MCH: 29.6 pg (ref 26.0–34.0)
MCHC: 34.4 g/dL (ref 30.0–36.0)
MCV: 86.2 fL (ref 80.0–100.0)
Monocytes Absolute: 0.4 10*3/uL (ref 0.1–1.0)
Monocytes Relative: 8 %
Neutro Abs: 3.8 10*3/uL (ref 1.7–7.7)
Neutrophils Relative %: 70 %
Platelet Count: 217 10*3/uL (ref 150–400)
RBC: 4.49 MIL/uL (ref 3.87–5.11)
RDW: 12.9 % (ref 11.5–15.5)
WBC Count: 5.4 10*3/uL (ref 4.0–10.5)
nRBC: 0 % (ref 0.0–0.2)

## 2023-12-30 LAB — URINALYSIS, COMPLETE (UACMP) WITH MICROSCOPIC
Bilirubin Urine: NEGATIVE
Glucose, UA: NEGATIVE mg/dL
Hgb urine dipstick: NEGATIVE
Ketones, ur: NEGATIVE mg/dL
Nitrite: NEGATIVE
Protein, ur: NEGATIVE mg/dL
Specific Gravity, Urine: 1.024 (ref 1.005–1.030)
pH: 5 (ref 5.0–8.0)

## 2023-12-30 LAB — TSH: TSH: 0.768 u[IU]/mL (ref 0.350–4.500)

## 2023-12-30 LAB — FOUNDATIONACT

## 2023-12-30 LAB — MAGNESIUM: Magnesium: 2.1 mg/dL (ref 1.7–2.4)

## 2023-12-31 ENCOUNTER — Encounter: Payer: Self-pay | Admitting: Oncology

## 2023-12-31 ENCOUNTER — Inpatient Hospital Stay (HOSPITAL_BASED_OUTPATIENT_CLINIC_OR_DEPARTMENT_OTHER): Admitting: Oncology

## 2023-12-31 ENCOUNTER — Other Ambulatory Visit: Payer: Self-pay

## 2023-12-31 ENCOUNTER — Other Ambulatory Visit (HOSPITAL_COMMUNITY): Payer: Self-pay

## 2023-12-31 ENCOUNTER — Ambulatory Visit

## 2023-12-31 VITALS — BP 146/63 | HR 61 | Temp 97.3°F | Resp 18 | Wt 166.2 lb

## 2023-12-31 DIAGNOSIS — C23 Malignant neoplasm of gallbladder: Secondary | ICD-10-CM | POA: Diagnosis not present

## 2023-12-31 DIAGNOSIS — N390 Urinary tract infection, site not specified: Secondary | ICD-10-CM | POA: Insufficient documentation

## 2023-12-31 DIAGNOSIS — Z5112 Encounter for antineoplastic immunotherapy: Secondary | ICD-10-CM | POA: Diagnosis not present

## 2023-12-31 LAB — T4: T4, Total: 10.4 ug/dL (ref 4.5–12.0)

## 2023-12-31 MED ORDER — CIPROFLOXACIN HCL 500 MG PO TABS
500.0000 mg | ORAL_TABLET | Freq: Two times a day (BID) | ORAL | 0 refills | Status: DC
  Filled 2023-12-31: qty 14, 7d supply, fill #0

## 2023-12-31 NOTE — Progress Notes (Signed)
 Patient seen by Dr. Archie Patten Pasam today  Vitals are within treatment parameters:Yes   Labs are within treatment parameters: Yes   Treatment plan has been signed: Yes   Per physician team, Patient is ready for treatment and there are NO modifications to the treatment plan.

## 2023-12-31 NOTE — Transitions of Care (Post Inpatient/ED Visit) (Deleted)
  Transition of Care week 2  Visit Note  12/31/2023  Name: Janice Pace MRN: [id number]          DOB: 11/06/1945  Situation: Patient enrolled in [facility] 30-day program. Visit completed with patient by telephone.   Background: New cancer diagnosis  Initial Transition Care Management Follow-up Telephone Call    Past Medical History:  Diagnosis Date   Arthritis    Asthma    Bronchitis    Cancer (HCC)    dermatosivros arcoma and protuverans   Carpal tunnel syndrome, left upper limb 11/06/2017   Chronic back pain    degenerative disc disease   Constipation    stool softener daily   Constipation    COPD (chronic obstructive pulmonary disease) (HCC)    GERD (gastroesophageal reflux disease)    takes Prilosec daily   GERD (gastroesophageal reflux disease)    Heart murmur    Heart valve disorder    Hemorrhoids    History of hiatal hernia    Ileus (HCC) 03/02/2021   Insomnia    d/t chantix    Joint pain    Nocturia    Ovarian failure 05/28/2022   Personal history of colonic adenoma 03/13/2003   03/13/2003 - 5 mm adenoma   Pre-diabetes    Prediabetes    White coat syndrome without diagnosis of hypertension     Assessment: Patient Reported Symptoms: Cognitive        Neurological      HEENT        Cardiovascular      Respiratory      Endocrine      Gastrointestinal        Genitourinary      Integumentary      Musculoskeletal          Psychosocial           There were no vitals filed for this visit.  Medications Reviewed Today   Medications were not reviewed in this encounter     Recommendation:   {RECOMMENDATONS:32554}  Follow Up Plan:   {FOLLOWUP:32559}  SIG ***

## 2023-12-31 NOTE — Patient Instructions (Signed)
 Visit Information  Thank you for taking time to visit with me today. Please don't hesitate to contact me if I can be of assistance to you before our next scheduled telephone appointment.  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  Patient with a positive attitude. Taking medications as prescribed and reports improved appetite. 12/31/2023  Reports some pain during the night and took tylenol . Recent loose stools and stopped Miralax .  Reports decrease in appetite.  Patient awaiting results of biopsy - ambulatory palliative referral noted and reviewed with patient - Oncology appointment scheduled 12/24/23 - (Dr Maryalice Smaller is out of the country so scheduled with Dr Pasam)12/25/2023  Results in and oncology reviewed with patient and family. 12/31/2023  Patient reports that she is meeting with oncologist today.   Goal:  Over the next 30 days, the patient will not experience hospital readmission  Interventions:  Transitions of Care: Doctor Visits  - discussed the importance of doctor visits, reviewed all pending appointments with patient on the phone. Patient was able to log into my chart while on the phone to see all her appointments. Reviewed with patient each appointment to ease her anxiety with scheduled appointments next week.  Discussed with patient that she could not get RX for morphine  at [facility] - due to being out of stock. Patient has requested RX to be sent to [facility] pharmacy.  Encouraged patient to call me if she needs assistance. She has already sent a message to the cancer center.    COPD Interventions:  Encouraged patient to continue to  use her maintenance medications as prescribed.  Oncology: Confirmed all appointments and her understanding of reasons for appointments.  Reviewed pain control.  Reviewed diarrhea and importance of hydration Encouraged patient to eat well.  Continue to take medications as prescribed. Reviewed concern for UTI with onset of urinary symptoms. Encouraged patient to discuss with oncologist at office visit today.   Patient Self Care Activities:  Attend all scheduled provider appointments Call pharmacy for medication refills 3-7 days in advance of running out of medications Call provider office for new concerns or questions  Notify RN Care Manager of TOC call rescheduling needs Participate in Transition of Care Program/Attend TOC scheduled calls Take medications as prescribed   eat healthy and drink plenty of fluids Be as active as you can.  Rest when your body tells you too.  Discuss urinary symptoms at office visit today.   Plan:  Telephone follow up appointment with care management team member scheduled for:  [phone] appointment scheduled with primary [facility] RN, Orpha Blade  The patient has been provided with contact information for the care management team and has been advised to call with any health related questions or concerns.          Our next appointment is by telephone on 01/06/2024  at 1100  Please call the care guide team at [phone] if you need to cancel or reschedule your appointment.   If you are experiencing a Mental Health or Behavioral Health Crisis or need someone to talk to, please call  the Suicide and Crisis Lifeline: 988 call the [place]  National Suicide Prevention Lifeline: (236) 278-1704 or TTY: 423-602-2857 TTY 651-828-1982) to talk to a trained counselor call 1-800-273-TALK (toll free, 24 hour hotline) call 911   Patient verbalizes understanding of instructions and care plan provided today and agrees to view in MyChart. Active MyChart status and patient  understanding of how to access instructions and care plan via MyChart confirmed with patient.     Orpha Blade, RN, BSN, CEN [employer]- Transition of Care Team.  [facility] [phone]

## 2023-12-31 NOTE — Assessment & Plan Note (Addendum)
 Please review oncology history for additional details and timeline of events.  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 Biopsy from the omentum confirmed adenocarcinoma, originating from the gallbladder.  Immunostains support diagnosis of gallbladder carcinoma.    Previously I discussed diagnosis, staging, prognosis, plan of care, treatment options.  Reviewed NCCN guidelines.   Given stage IV disease, all treatment options are palliative in nature and not curative in intent.  This was discussed with patient, her daughter and son-in-law who were accompanying.  They verbalized understanding.    Discussed the aggressive nature of stage IV gallbladder adenocarcinoma and overall poor prognosis.  Life expectancy with treatment is 1 to 1.5 years; without treatment, less than six months.   We submitted NGS panel testing on the specimen and also liquid biopsy for mutation analysis.  Staging PET scan on 12/29/2023 showed known gallbladder mass extending into the liver.  Peritoneal carcinomatosis noted.  Numerous lung nodules were identified, small bowel with FDG uptake, concerning for metastatic disease in the lungs.  Metastatic lymphadenopathy was noted in the abdomen and retroperitoneum.  This scan will serve as a good baseline for future compression.  Discussed results with the patient and family members today.  Plan is to proceed with palliative systemic chemoimmunotherapy with gemcitabine, cisplatin and durvalumab.  She had chemo education yesterday.  Scheduled to begin cycle 1 day 1 of chemotherapy using peripheral IV tomorrow.  Port-A-Cath scheduled for next week.  Labs from 12/30/2023 showed no dose-limiting toxicities.  Discussed most common side effects with the regimen including nausea, fatigue, cytopenias, electrolyte imbalances, renal dysfunction, neuropathy, ringing in the ears, hearing loss, immune mediated side effects.  - Ensure hydration and nutrition are  maintained during treatment.  RTC in 1 week for labs, follow-up and continuation of chemotherapy.

## 2023-12-31 NOTE — Assessment & Plan Note (Signed)
 Urinary tract infection with symptoms of dysuria. Allergy to sulfa antibiotics noted. Ciprofloxacin  not listed in allergy list and will be used for treatment. - Prescribe ciprofloxacin  for 7 days, twice daily, and send prescription to [facility].

## 2023-12-31 NOTE — Patient Outreach (Signed)
 Transition of Care week 2  Visit Note  12/31/2023  Name: Janice Pace MRN: [id number]          DOB: February 15, 1946  Situation: Patient enrolled in [facility] 30-day program. Visit completed with patient by telephone.   Background: new diagnosis of cancer  Initial Transition Care Management Follow-up Telephone Call    Past Medical History:  Diagnosis Date   Arthritis    Asthma    Bronchitis    Cancer (HCC)    dermatosivros arcoma and protuverans   Carpal tunnel syndrome, left upper limb 11/06/2017   Chronic back pain    degenerative disc disease   Constipation    stool softener daily   Constipation    COPD (chronic obstructive pulmonary disease) (HCC)    GERD (gastroesophageal reflux disease)    takes Prilosec daily   GERD (gastroesophageal reflux disease)    Heart murmur    Heart valve disorder    Hemorrhoids    History of hiatal hernia    Ileus (HCC) 03/02/2021   Insomnia    d/t chantix    Joint pain    Nocturia    Ovarian failure 05/28/2022   Personal history of colonic adenoma 03/13/2003   03/13/2003 - 5 mm adenoma   Pre-diabetes    Prediabetes    White coat syndrome without diagnosis of hypertension     Assessment: Patient reports she is doing well. Reports that she was tired after her PET scan.  Reports woke up during the night with pain and took a tylenol . Denies any current pain at this time.  Denies any new problems or concerns today. She is looking forward to seeing the oncologist today.  Patient Reported Symptoms: Cognitive Cognitive Status: Able to follow simple commands, Alert and oriented to person, place, and time, Normal speech and language skills      Neurological Neurological Review of Symptoms: No symptoms reported    HEENT HEENT Symptoms Reported: No symptoms reported      Cardiovascular Cardiovascular Symptoms Reported: No symptoms reported    Respiratory Respiratory Symptoms Reported: No symptoms reported    Endocrine Patient reports the  following symptoms related to hypoglycemia or hyperglycemia : No symptoms reported    Gastrointestinal Gastrointestinal Symptoms Reported: Diarrhea Additional Gastrointestinal Details: Reports diarrhea while taking miralax  and stool softner.    Reports poor appetite. eats half of her normal amounts. Feel full      Genitourinary Genitourinary Symptoms Reported: Pain with urination Additional Genitourinary Details: reports that has pain with urination, reports feeling like she has a UTI. Genitourinary Conditions: Urinary tract infection  Integumentary Integumentary Symptoms Reported: No symptoms reported    Musculoskeletal Musculoskelatal Symptoms Reviewed: No symptoms reported        Psychosocial Psychosocial Symptoms Reported: Other Other Psychosocial Conditions: sounds update on the phone and in good spirits.         There were no vitals filed for this visit.  Medications Reviewed Today     Reviewed by Vanetta Generous, RN (Registered Nurse) on 12/31/23 at [phone]  Med List Status: <None>   Medication Order Taking? Sig Documenting Provider Last Dose Status Informant  albuterol  (VENTOLIN  HFA) 108 (90 Base) MCG/ACT inhaler [id number] Yes Inhale 1-2 puffs into the lungs every 6 (six) hours as needed for wheezing or shortness of breath. Roetta Clarke, NP  Active Self  aspirin  81 MG EC tablet [id number] Yes Take 81 mg by mouth daily. [provider]  Active Self  budesonide  (PULMICORT ) 0.5 MG/2ML nebulizer  solution [id number] Yes Generic for Pulmicort . Inhale one vial in nebulizer twice a day. Rinse mouth after use. Wilfredo Hanly, MD  Active Self  chlordiazePOXIDE  (LIBRIUM ) 10 MG capsule [id number] Yes Take 1 capsule (10 mg total) by mouth as needed for anxiety (max of twice daily). Unk Garb, DO  Active   dexamethasone  (DECADRON ) 4 MG tablet [id number] Yes Take 2 tablets (8 mg) by mouth daily x 3 days starting the day after cisplatin chemotherapy. Take with food. Pasam, Gale Jude,  MD  Active   docusate sodium  (COLACE) 100 MG capsule [id number] Yes Take 500 mg by mouth at bedtime. [provider]  Active Self  formoterol  (PERFOROMIST ) 20 MCG/2ML nebulizer solution [id number] Yes Substituted for: Perforomist  Solution Inhale one vial in nebulizer twice a day. Wilfredo Hanly, MD  Active Self  lidocaine -prilocaine  (EMLA ) cream [id number] Yes Apply to affected area once Pasam, Avinash, MD  Active   metoprolol  succinate (TOPROL -XL) 50 MG 24 hr tablet [id number] Yes TAKE 1 TABLET BY MOUTH ONCE DAILY. TAKE WITH OR IMMEDIATELY FOLLOWING A MEAL Laneta Pintos, MD  Active Self  morphine  (MS CONTIN ) 15 MG 12 hr tablet [id number] Yes Take 1 tablet (15 mg total) by mouth every 12 (twelve) hours. Arlo Berber, MD  Active   Kennedy Peabody [id number]  Take 8 oz by mouth See admin instructions. Mushroom coffee: Drink 8-10 ounces by mouth once a day as needed to aid bowel movements  Patient not taking: Reported on 12/31/2023   [provider]  Active Self  omeprazole  (PRILOSEC) 20 MG capsule [id number] Yes Take 1 capsule (20 mg total) by mouth 2 (two) times daily before a meal. Clapp, Kara F, PA-C  Active   ondansetron  (ZOFRAN ) 4 MG tablet [id number] Yes Take 1 tablet (4 mg total) by mouth every 6 (six) hours as needed for nausea. Unk Garb, DO  Active   ondansetron  (ZOFRAN ) 8 MG tablet [id number] Yes Take 1 tablet (8 mg total) by mouth every 8 (eight) hours as needed for nausea or vomiting. Start on the third day after cisplatin. Pasam, Gale Jude, MD  Active   oxyCODONE  10 MG TABS [id number]  Take 1-1.5 tablets (10-15 mg total) by mouth every 4 (four) hours as needed for moderate pain (pain score 4-6) or severe pain (pain score 7-10). Unk Garb, DO  Active   polyethylene glycol (MIRALAX  / GLYCOLAX ) 17 g packet [id number]  Take 17 g by mouth 2 (two) times daily.  Patient not taking: Reported on 12/31/2023   [provider]  Active   prochlorperazine  (COMPAZINE ) 10 MG tablet  [id number] Yes Take 1 tablet (10 mg total) by mouth every 6 (six) hours as needed (Nausea or vomiting). Pasam, Gale Jude, MD  Active   revefenacin  (YUPELRI ) 175 MCG/3ML nebulizer solution [id number] Yes Take 3 mLs (175 mcg total) by nebulization daily. Wilfredo Hanly, MD  Active Self  rosuvastatin  (CRESTOR ) 10 MG tablet [id number] Yes Take 1 tablet (10 mg total) by mouth daily. Noreene Bearded, PA  Active Self            Recommendation:   Specialty provider follow-up today  Follow Up Plan:   Telephone follow up appointment date/time:  01/06/2024  Orpha Blade, RN, BSN, CEN Population Health- Transition of Care Team.  [facility] [phone]

## 2023-12-31 NOTE — Progress Notes (Signed)
 [facility]  ONCOLOGY CLINIC PROGRESS NOTE   Patient Care Team: Janice Beards, NP as PCP - General (Internal Medicine) Pcp, Janice [name], DO (Family Medicine) [place], [facility] Janice Generous, RN as [facility] Janice Pace, Janice Mare, RN as Oncology Nurse Navigator  PATIENT NAME: Janice Pace   MR#: [id number] DOB: [birth year]  Date of visit: 12/31/2023   ASSESSMENT & PLAN:   Janice Pace is a 78 y.o. 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   Adenocarcinoma of gallbladder [facility]) Please review oncology history for additional details and timeline of events.  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 Biopsy from the omentum confirmed adenocarcinoma, originating from the gallbladder.  Immunostains support diagnosis of gallbladder carcinoma.    Previously I discussed diagnosis, staging, prognosis, plan of care, treatment options.  Reviewed NCCN guidelines.   Given stage IV disease, all treatment options are palliative in nature and not curative in intent.  This was discussed with patient, her daughter and son-in-law who were accompanying.  They verbalized understanding.    Discussed the aggressive nature of stage IV gallbladder adenocarcinoma and overall poor prognosis.  Life expectancy with treatment is 1 to 1.5 years; without treatment, less than six months.   We submitted NGS panel testing on the specimen and also liquid biopsy for mutation analysis.  Staging PET scan on 12/29/2023 showed known gallbladder mass extending into the liver.  Peritoneal carcinomatosis noted.  Numerous lung nodules were identified, small bowel with FDG uptake, concerning for  metastatic disease in the lungs.  Metastatic lymphadenopathy was noted in the abdomen and retroperitoneum.  This scan will serve as a good baseline for future compression.  Discussed results with the patient and family members today.  Plan is to proceed with palliative systemic chemoimmunotherapy with gemcitabine, cisplatin and durvalumab.  She had chemo education yesterday.  Scheduled to begin cycle 1 day 1 of chemotherapy using peripheral IV tomorrow.  Port-A-Cath scheduled for next week.  Labs from 12/30/2023 showed Janice dose-limiting toxicities.  Discussed most common side effects with the regimen including nausea, fatigue, cytopenias, electrolyte imbalances, renal dysfunction, neuropathy, ringing in the ears, hearing loss, immune mediated side effects.  - Ensure hydration and nutrition are maintained during treatment.  RTC in 1 week for labs, follow-up and continuation of chemotherapy.  Urinary tract infection Urinary tract infection with symptoms of dysuria. Allergy to sulfa antibiotics noted. Ciprofloxacin  not listed in allergy list and will be used for treatment. - Prescribe ciprofloxacin  for 7 days, twice daily, and send prescription to [facility].    I reviewed lab results and outside records for this visit and discussed relevant results with the patient. Diagnosis, plan of care and treatment options were also discussed in detail with the patient. Opportunity provided to ask questions and answers provided to her apparent satisfaction. Provided instructions to call our clinic with any problems, questions or concerns prior to return visit. I recommended to continue follow-up with PCP and sub-specialists. She verbalized understanding and agreed with the plan.   NCCN guidelines have been consulted in the planning of this patient's care.  I spent a total of 30 minutes during this encounter with the patient including review of chart and various tests results, discussions about plan  of  care and coordination of care plan.   Janice Berber, MD  12/31/2023 2:00 PM  [facility] - A DEPT OF [facility] [address] [place] [address] Dept: [phone] Dept Fax: [phone]    CHIEF COMPLAINT/ REASON FOR VISIT:   Stage IV gallbladder adenocarcinoma with peritoneal mets and lung mets.  Current Treatment: Palliative systemic chemoimmunotherapy with cisplatin, gemcitabine, durvalumab started from 01/01/2024.  INTERVAL HISTORY:    Discussed the use of AI scribe software for clinical note transcription with the patient, who gave verbal consent to proceed.  History of Present Illness Janice Pace is a 78 year old female with gallbladder cancer who presents for chemotherapy education and treatment planning. She is accompanied by her boyfriend.  She has a known diagnosis of gallbladder cancer with peritoneal carcinomatosis and lung nodules. A recent PET scan confirmed disease in the gallbladder, peritoneal lining, and lung nodules, with one nodule measuring approximately 11 mm. This scan will serve as a baseline for future comparisons.  She experiences burning during urination, suggestive of a urinary tract infection. She is allergic to sulfa antibiotics and is uncertain if she has taken ciprofloxacin  before.  Her blood work from the previous day showed normal results, including kidney and liver function, electrolytes, and thyroid  function. She has been prescribed medications for nausea and a numbing medicine.  She is currently taking MS Contin , a long-acting morphine , twice daily for pain management, and oxycodone  as needed. She uses Tylenol  for additional pain relief at night, which has been effective. She also uses stool softeners to manage constipation.  She experiences some back pain at night, which she manages with a TENS unit, providing significant relief. Janice fever or chills. She reports tenderness  in the abdomen but Janice significant pain.     I have reviewed the past medical history, past surgical history, social history and family history with the patient and they are unchanged from previous note.  HISTORY OF PRESENT ILLNESS:   ONCOLOGY HISTORY:   78 y.o. lady with past medical history of essential hypertension, osteoarthritis, COPD/asthma, GERD, depression, anxiety, hiatal hernia, chronic back pain presented to the ED on 12/14/2023 with complaints of worsening epigastric/abdominal pain over 2-3 weeks.  Routine blood work was unremarkable in the ER.  However CT abdomen and pelvis showed irregular appearance of the gallbladder with suggestion of capsular thickening along the fundus of the gallbladder.  Gallbladder appeared inseparable from the adjacent portion of the liver.  Clinical picture was concerning for gallbladder carcinoma.  Mesenteric nodular thickening along the mid lower abdominal cavity indicating peritoneal carcinomatosis.  Soft tissue nodular mass in the left lower quadrant region of the pelvis measuring 3.6 x 2.7 cm, likely ovarian neoplastic lesion.  Multiple lung nodules in the lower lobes bilaterally, largest measuring 12 mm in the right lower lobe, concerning for pulmonary metastatic disease.   She was admitted to the hospital for further evaluation and management.  IR was consulted for peritoneal mass biopsy.  CA 19-9 was significant elevated at 14,368.  CEA was increased at 98.7, CA-125 was also increased at 91.3.   On 12/14/2023, MRI abdomen/MRCP showed thickened, contracted gallbladder with a heterogeneous mass arising from the gallbladder fundus, contiguous with the adjacent liver parenchyma of hepatic segment IVB measuring 3.5 x 2.8 cm. Multiple small rim enhancing lesions within the liver parenchyma. Enlarged, necrotic appearing portacaval and porta hepatis lymph nodes. Small, although rim enhancing and abnormal  appearing retroperitoneal lymph nodes. Peritoneal stranding and  nodularity, incompletely visualized on this examination although present in the left upper quadrant, consistent with peritoneal metastatic disease. Numerous small bilateral pulmonary nodules, better assessed by CT. 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   On 12/15/2023, she underwent CT-guided biopsy of the omental lesion.  Pathology showed metastatic well to moderately differentiated adenocarcinoma. The tumor is positive for cytokeratin 7 and shows focal weak positivity for the GI marker CDX2.  The tumor is negative for the GI markers cytokeratin 20.  The tumor is also negative for the GU and GYN marker PAX8.  The tumor is negative for the pulmonary adeno marker TTF-1. This immunohistochemical pattern and histomorphology would be compatible with the clinical suspicion of a gallbladder primary.  The differential diagnosis would also include an upper GI or pancreaticobiliary primary.    Patient presented to our clinic on 12/24/2023 to establish care with us .  Request placed for staging PET scan.  NGS panel testing requested on the specimen and also liquid biopsy obtained.  Staging PET scan on 12/29/2023 showed known gallbladder mass extending into the liver.  Peritoneal carcinomatosis noted.  Numerous lung nodules were identified, small bowel with FDG uptake, concerning for metastatic disease in the lungs.  Metastatic lymphadenopathy was noted in the abdomen and retroperitoneum.  This scan will serve as a good baseline for future compression.    Plan for palliative systemic chemoimmunotherapy with cisplatin, gemcitabine, durvalumab. Scheduled to start C1D1 from 01/01/24.   Oncology History  Adenocarcinoma of gallbladder (HCC)  12/24/2023 Initial Diagnosis   Adenocarcinoma of gallbladder (HCC)   12/24/2023 Cancer Staging   Staging form: Gallbladder, AJCC 8th Edition - Clinical: Stage IVB (cTX, cNX, pM1) - Signed by Janice Berber, MD on  12/24/2023 Histologic grade (G): G2 Histologic grading system: 3 grade system   12/31/2023 -  Chemotherapy   Patient is on Treatment Plan : BILIARY TRACT Cisplatin + Gemcitabine D1,8 + Durvalumab (1500) D1 q21d / Durvalumab (1500) q28d         REVIEW OF SYSTEMS:   Review of Systems - Oncology  All other pertinent systems were reviewed with the patient and are negative.  ALLERGIES: She is allergic to sulfa antibiotics, forteo  [parathyroid hormone (recomb)], atorvastatin, and metformin hcl.  MEDICATIONS:  Current Outpatient Medications  Medication Sig Dispense Refill   albuterol  (VENTOLIN  HFA) 108 (90 Base) MCG/ACT inhaler Inhale 1-2 puffs into the lungs every 6 (six) hours as needed for wheezing or shortness of breath. 8 g 1   aspirin  81 MG EC tablet Take 81 mg by mouth daily.     budesonide  (PULMICORT ) 0.5 MG/2ML nebulizer solution Generic for Pulmicort . Inhale one vial in nebulizer twice a day. Rinse mouth after use. 60 mL 11   chlordiazePOXIDE  (LIBRIUM ) 10 MG capsule Take 1 capsule (10 mg total) by mouth as needed for anxiety (max of twice daily). 60 capsule 0   ciprofloxacin  (CIPRO ) 500 MG tablet Take 1 tablet (500 mg total) by mouth 2 (two) times daily. 14 tablet 0   dexamethasone  (DECADRON ) 4 MG tablet Take 2 tablets (8 mg) by mouth daily x 3 days starting the day after cisplatin chemotherapy. Take with food. 30 tablet 1   docusate sodium  (COLACE) 100 MG capsule Take 500 mg by mouth at bedtime.     formoterol  (PERFOROMIST ) 20 MCG/2ML nebulizer solution Substituted for: Perforomist  Solution Inhale one vial in nebulizer twice a day. 60 mL 11   lidocaine -prilocaine  (EMLA ) cream  Apply to affected area once 30 g 3   metoprolol  succinate (TOPROL -XL) 50 MG 24 hr tablet TAKE 1 TABLET BY MOUTH ONCE DAILY. TAKE WITH OR IMMEDIATELY FOLLOWING A MEAL 90 tablet 1   morphine  (MS CONTIN ) 15 MG 12 hr tablet Take 1 tablet (15 mg total) by mouth every 12 (twelve) hours. 60 tablet 0   NON FORMULARY  Take 8 oz by mouth See admin instructions. Mushroom coffee: Drink 8-10 ounces by mouth once a day as needed to aid bowel movements     omeprazole  (PRILOSEC) 20 MG capsule Take 1 capsule (20 mg total) by mouth 2 (two) times daily before a meal. 60 capsule 3   ondansetron  (ZOFRAN ) 4 MG tablet Take 1 tablet (4 mg total) by mouth every 6 (six) hours as needed for nausea. 30 tablet 0   ondansetron  (ZOFRAN ) 8 MG tablet Take 1 tablet (8 mg total) by mouth every 8 (eight) hours as needed for nausea or vomiting. Start on the third day after cisplatin. 30 tablet 1   oxyCODONE  10 MG TABS Take 1-1.5 tablets (10-15 mg total) by mouth every 4 (four) hours as needed for moderate pain (pain score 4-6) or severe pain (pain score 7-10). 60 tablet 0   prochlorperazine  (COMPAZINE ) 10 MG tablet Take 1 tablet (10 mg total) by mouth every 6 (six) hours as needed (Nausea or vomiting). 30 tablet 1   revefenacin  (YUPELRI ) 175 MCG/3ML nebulizer solution Take 3 mLs (175 mcg total) by nebulization daily.     rosuvastatin  (CRESTOR ) 10 MG tablet Take 1 tablet (10 mg total) by mouth daily. 90 tablet 1   polyethylene glycol (MIRALAX  / GLYCOLAX ) 17 g packet Take 17 g by mouth 2 (two) times daily. (Patient not taking: Reported on 12/31/2023)     Janice current facility-administered medications for this visit.     VITALS:   Blood pressure (!) 146/63, pulse 61, temperature (!) 97.3 F (36.3 C), temperature source Temporal, resp. rate 18, weight 166 lb 3.2 oz (75.4 kg), SpO2 99%.  Wt Readings from Last 3 Encounters:  12/31/23 166 lb 3.2 oz (75.4 kg)  12/24/23 163 lb 11.2 oz (74.3 kg)  12/22/23 161 lb 12 oz (73.4 kg)    Body mass index is 30.4 kg/m.    Onc Performance Status - 12/31/23 1205       ECOG Perf Status   ECOG Perf Status Ambulatory and capable of all selfcare but unable to carry out any work activities.  Up and about more than 50% of waking hours      KPS SCALE   KPS % SCORE Cares for self, unable to carry on  normal activity or to do active work          PHYSICAL EXAM:   Physical Exam Constitutional:      General: She is not in acute distress.    Appearance: Normal appearance.  HENT:     Head: Normocephalic and atraumatic.   Cardiovascular:     Rate and Rhythm: Normal rate.  Pulmonary:     Effort: Pulmonary effort is normal. Janice respiratory distress.  Abdominal:     General: There is Janice distension.   Neurological:     General: Janice focal deficit present.     Mental Status: She is alert and oriented to person, place, and time.   Psychiatric:        Mood and Affect: Mood normal.        Behavior: Behavior normal.  LABORATORY DATA:   I have reviewed the data as listed.  Janice results found for any visits on 12/31/23.  Results for orders placed or performed in visit on 12/30/23 (from the past 72 hours)  Urinalysis, Complete w Microscopic     Status: Abnormal   Collection Time: 12/30/23  2:48 PM  Result Value Ref Range   Color, Urine YELLOW YELLOW   APPearance HAZY (A) CLEAR   Specific Gravity, Urine 1.024 1.005 - 1.030   pH 5.0 5.0 - 8.0   Glucose, UA NEGATIVE NEGATIVE mg/dL   Hgb urine dipstick NEGATIVE NEGATIVE   Bilirubin Urine NEGATIVE NEGATIVE   Ketones, ur NEGATIVE NEGATIVE mg/dL   Protein, ur NEGATIVE NEGATIVE mg/dL   Nitrite NEGATIVE NEGATIVE   Leukocytes,Ua LARGE (A) NEGATIVE   RBC / HPF 0-5 0 - 5 RBC/hpf   WBC, UA 11-20 0 - 5 WBC/hpf   Bacteria, UA MANY (A) NONE SEEN   Squamous Epithelial / HPF 11-20 0 - 5 /HPF   Mucus PRESENT    Hyaline Casts, UA PRESENT     Comment: Performed at [facility], [address], [place], [place] [address]     RADIOGRAPHIC STUDIES:  I have personally reviewed the radiological images as listed and agree with the findings in the report.  NM PET Image Initial (PI) Skull Base To Thigh Result Date: 12/29/2023 CLINICAL DATA:  Initial treatment strategy for gallbladder carcinoma. EXAM: NUCLEAR MEDICINE PET  SKULL BASE TO THIGH TECHNIQUE: 9.05 mCi F-18 FDG was injected intravenously. Full-ring PET imaging was performed from the skull base to thigh after the radiotracer. CT data was obtained and used for attenuation correction and anatomic localization. Fasting blood glucose: 118 mg/dl COMPARISON:  MRI abdomen and CT abdomen pelvis 12/14/2023. Chest CT 12/23/2022. FINDINGS: Mediastinal blood pool activity: SUV max 2.8 Liver activity: SUV max 2.9 NECK: Janice specific abnormal uptake seen above blood pool in the neck including along lymph node change of the submandibular, posterior triangle or internal jugular region. Near symmetric uptake of the visualized intracranial compartment. Incidental CT findings: The parotid glands, submandibular glands and thyroid  gland are unremarkable scattered vascular calcifications in the neck. Mastoid air cells are clear. The visualized paranasal sinuses are clear. CHEST: There is Janice abnormal uptake seen above blood pool in the axillary regions, hilum or mediastinum. Slight uptake along the lower esophagus but there is a small hiatal hernia. Lungs demonstrate several scattered nodules bilaterally. Many of which are under 5 mm but there are some additional dominant foci seen. Example right lower lobe medially measuring 11 mm on series 6, image 70. This likely other larger foci are hypermetabolic. The specific lesion has maximum SUV of 4.6. Another example medial left lower lobe on image 64 measuring 9 mm in AP dimension has uptake of maximum SUV of 2.4. Again low but the lesions are small. Right upper lobe focus has dimension on image 49 of 9 mm. This abuts the pleura anteriorly and has maximum SUV of 3.7. The lesions are worrisome for lung metastases. Estimating up to 30 nodules are seen in each lung. Incidental CT findings: Some breathing motion. Dependent basilar atelectasis. Janice consolidation, pneumothorax or effusion. Heart is nonenlarged. Coronary artery calcifications are seen. The  thoracic aorta has a normal course and caliber. There are some small mediastinal nodes identified, less than a cm in short axis and not pathologic by size criteria. These do not have abnormal uptake at this time. ABDOMEN/PELVIS: As seen on the prior examinations there  is masslike appearance to the gallbladder in the adjacent portion of the liver in segment 4 with uptake. The uptake in segment 4 has maximum SUV of 6.8 consistent with neoplasm. A few small areas elsewhere in left hepatic lobe seen such as image 80 of the PET scan measuring maximum SUV value of 5.1. This area measures up to a cm in size. Several additional foci seen such as image 76, 77. There is also a surface nodule along the right hepatic lobe laterally which is hypermetabolic of maximum SUV value of 7.0. Slight thickening in this location on image 886 of the liver measuring 7 mm of the capsule. There also hypermetabolic nodules elsewhere including in the mesentery left upper quadrant which has a focus of maximum SUV value of 6.0 and on image 83 of the CT scan measures 11 by 9 mm. Additional area in the anterior pelvic mesentery on image 122 in the midline measures 3.3 x 1.5 cm and has uptake of 5.3 maximum SUV. Other areas as well. Pelvic lesions are identified which are hypermetabolic in the area of the adnexa. These could be additional areas of disease including Krunkenberg tumors. The larger focus on the left on image 127 of the CT scan measures 4.4 x 3.1 cm with maximum SUV of 17.1. The right-sided focus has maximum SUV of 13.3 and a smaller on image 129 measuring 17 by 15 mm. There are few retroperitoneal nodes which have some uptake including aortocaval, portacaval and periaortic. Example left para-aortic node which has maximum SUV of 5.0, on image 96 measures 10 by 7 mm. Portacaval node on image 83 measures 2.5 x 1.3 cm and has maximum SUV of 7.9. Incidental CT findings: Bowel is nondilated. Scattered colonic stool. Few air-fluid levels  along the colon. Spleen is nonenlarged. Bilateral adrenal nodular to are identified which are without abnormal uptake. These could be adenomas but are indeterminate on this examination. However these lesions have been present and stable since at least August 2022 demonstrating long-term stability and likely benign etiology. Scattered vascular calcifications identified along the aorta and branch vessels. Area of potential stenosis along the SMA. Mild mesenteric stranding. Janice frank ascites. Janice definite renal or ureteral stones. Underdistended urinary bladder. SKELETON: Janice abnormal uptake identified along the visualized osseous structures. Incidental CT findings: Significant streak artifact from the extensive hardware fixation throughout the lower thoracic and lumbar spine extending into the upper sacrum. Curvature and degenerative changes are noted along the spine. Degenerative changes elsewhere as well. Areas of canal encroachment seen suggested along the midthoracic spine such as at T5-6 level. Osteophytes seen extending towards the central canal. Laminectomy changes along the lower lumbar spine as well. IMPRESSION: Focal hypermetabolic mass lesion involving the gallbladder extending into segment 4B of the liver. Additional small areas of uptake in the left hepatic lobe. Please correlate for the finding seen on recent MRI of the liver. Areas of peritoneal carcinomatosis with several mesenteric nodules and liver surface. There is also presence of pelvic lesions which are hypermetabolic, possible drop metastases,Krunkenberg like tumors. Numerous lung nodules identified. Many of which are quite small and have Janice rim minimal uptake. Some of the larger lesions have more intense uptake in measure up to 11 mm. These are worrisome for multifocal lung metastases. Few hypermetabolic upper abdominal lymph nodes including portacaval and retroperitoneal consistent with spread of disease. Extensive spinal fixation hardware with  degenerative changes and laminectomy. Areas of potential stenosis. Electronically Signed   By: Adrianna Horde M.D.   On:  12/29/2023 15:25   CT ABDOMINAL MASS BIOPSY Result Date: 12/15/2023 INDICATION: Pelvic mass EXAM: CT-guided core needle biopsy pelvic mass TECHNIQUE: Multidetector CT imaging of the pelvis was performed following the standard protocol without IV contrast. RADIATION DOSE REDUCTION: This exam was performed according to the departmental dose-optimization program which includes automated exposure control, adjustment of the mA and/or kV according to patient size and/or use of iterative reconstruction technique. MEDICATIONS: None. ANESTHESIA/SEDATION: Moderate (conscious) sedation was employed during this procedure. A total of Versed  1 mg and Fentanyl  100 mcg was administered intravenously by the radiology nurse. Total intra-service moderate Sedation Time: 14 minutes. The patient's level of consciousness and vital signs were monitored continuously by radiology nursing throughout the procedure under my direct supervision. COMPLICATIONS: None immediate. PROCEDURE: Informed written consent was obtained from the patient after a thorough discussion of the procedural risks, benefits and alternatives. All questions were addressed. Maximal Sterile Barrier Technique was utilized including caps, mask, sterile gowns, sterile gloves, sterile drape, hand hygiene and skin antiseptic. A timeout was performed prior to the initiation of the procedure. The patient was placed on the CT gantry in a supine position. Radiopaque markers were placed on the patient's skin near the target lesion. The the patient's pelvis was then imaged, measurements were obtained, and the patient's skin was marked for the best trajectory for percutaneous biopsy. Local anesthesia was achieved with 1% lidocaine  by infiltrative subcutaneous tissue to the level of the abdominal rectus with 1% lidocaine . A stab incision was then created and the  biopsy trocar was advanced to the level of the rectus. Further imaging was obtained. The needle was then removed and the biopsy BioPince needle was advanced through the cannula and a biopsy sample was obtained. Further imaging demonstrated Janice significant hemorrhage. A second core needle biopsy was obtained, but minimal material returned. All needles were removed the patient. Sterile dressing was applied. IMPRESSION: Successful core needle biopsy of an omental mass in the midline in the patient's pelvis. 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 Heavily T2-weighted images of the biliary and pancreatic ducts were obtained, and three-dimensional MRCP images were rendered by post processing. CONTRAST:  7mL GADAVIST  GADOBUTROL  1 MMOL/ML IV SOLN COMPARISON:  CT abdomen pelvis, 12/14/2023 FINDINGS: Lower chest: Janice acute abnormality. Numerous small bilateral pulmonary nodules, better assessed by CT (series 14, image 5). Hepatobiliary: Thickened, contracted gallbladder with a heterogeneous mass arising from the gallbladder fundus, contiguous with the adjacent liver parenchyma of hepatic segment IVB measuring 3.5 x 2.8 cm (series 14, image 37). Multiple small rim enhancing lesions within the liver parenchyma, for example in anterior hepatic segment II measuring 1.2 x 1.1 cm (series 14, image 19). Pancreas: Unremarkable. Janice pancreatic ductal dilatation or surrounding inflammatory changes. Spleen: Normal in size without significant abnormality. Adrenals/Urinary Tract: Unchanged, definitively benign  macroscopic fat containing adrenal adenomata requiring Janice specific further follow-up or characterization. Kidneys are normal, without obvious renal calculi, solid lesion, or hydronephrosis. Stomach/Bowel: Stomach is within normal limits. Janice evidence of bowel wall thickening, distention, or inflammatory changes. Vascular/Lymphatic: Aortic atherosclerosis. Enlarged, necrotic appearing portacaval and porta hepatis lymph nodes measuring up 2 2.9 x  1.6 cm (series 14, image 31). Small, although rim enhancing and abnormal appearing retroperitoneal lymph nodes (series 14, image 50). Other: Janice abdominal wall hernia or abnormality. Janice ascites. Peritoneal stranding and nodularity, incompletely visualized on this examination although present in the left upper quadrant (series 14, image 29). Musculoskeletal: Janice acute or significant osseous findings. IMPRESSION: 1. Thickened, contracted gallbladder with a heterogeneous mass arising from the gallbladder fundus, contiguous with the adjacent liver parenchyma of hepatic segment IVB measuring 3.5 x 2.8 cm. 2. Multiple small rim enhancing lesions within the liver parenchyma. 3. Enlarged, necrotic appearing portacaval and porta hepatis lymph nodes. Small, although rim enhancing and abnormal appearing retroperitoneal lymph nodes. 4. Peritoneal stranding and nodularity, incompletely visualized on this examination although present in the left upper quadrant, consistent with peritoneal metastatic disease. 5. Numerous small bilateral pulmonary nodules, better assessed by CT. 6. 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 Aortic Atherosclerosis (ICD10-I70.0). Electronically Signed   By: Fredricka Jenny M.D.   On: 12/14/2023 21:08   MR 3D Recon At Scanner Result Date: 12/14/2023 CLINICAL DATA:  Gallbladder malignancy suspected, gallbladder mass, pulmonary metastases, and peritoneal metastatic disease identified by prior CT EXAM: MRI  ABDOMEN WITHOUT AND WITH CONTRAST (INCLUDING MRCP) TECHNIQUE: Multiplanar multisequence MR imaging of the abdomen was performed both before and after the administration of intravenous contrast. Heavily T2-weighted images of the biliary and pancreatic ducts were obtained, and three-dimensional MRCP images were rendered by post processing. CONTRAST:  7mL GADAVIST  GADOBUTROL  1 MMOL/ML IV SOLN COMPARISON:  CT abdomen pelvis, 12/14/2023 FINDINGS: Lower chest: Janice acute abnormality. Numerous small bilateral pulmonary nodules, better assessed by CT (series 14, image 5). Hepatobiliary: Thickened, contracted gallbladder with a heterogeneous mass arising from the gallbladder fundus, contiguous with the adjacent liver parenchyma of hepatic segment IVB measuring 3.5 x 2.8 cm (series 14, image 37). Multiple small rim enhancing lesions within the liver parenchyma, for example in anterior hepatic segment II measuring 1.2 x 1.1 cm (series 14, image 19). Pancreas: Unremarkable. Janice pancreatic ductal dilatation or surrounding inflammatory changes. Spleen: Normal in size without significant abnormality. Adrenals/Urinary Tract: Unchanged, definitively benign macroscopic fat containing adrenal adenomata requiring Janice specific further follow-up or characterization. Kidneys are normal, without obvious renal calculi, solid lesion, or hydronephrosis. Stomach/Bowel: Stomach is within normal limits. Janice evidence of bowel wall thickening, distention, or inflammatory changes. Vascular/Lymphatic: Aortic atherosclerosis. Enlarged, necrotic appearing portacaval and porta hepatis lymph nodes measuring up 2 2.9 x 1.6 cm (series 14, image 31). Small, although rim enhancing and abnormal appearing retroperitoneal lymph nodes (series 14, image 50). Other: Janice abdominal wall hernia or abnormality. Janice ascites. Peritoneal stranding and nodularity, incompletely visualized on this examination although present in the left upper quadrant (series 14, image 29).  Musculoskeletal: Janice acute or significant osseous findings. IMPRESSION: 1. Thickened, contracted gallbladder with a heterogeneous mass arising from the gallbladder fundus, contiguous with the adjacent liver parenchyma of hepatic segment IVB measuring 3.5 x 2.8 cm. 2. Multiple small rim enhancing lesions within the liver parenchyma. 3. Enlarged, necrotic appearing portacaval and porta hepatis lymph nodes. Small, although rim enhancing and abnormal appearing retroperitoneal lymph nodes. 4. Peritoneal stranding and nodularity, incompletely visualized on this examination although present in the left upper quadrant, consistent with peritoneal metastatic disease. 5. Numerous small bilateral pulmonary nodules, better assessed by CT. 6. 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 Aortic Atherosclerosis (ICD10-I70.0). Electronically Signed   By: Fredricka Jenny M.D.   On: 12/14/2023  21:08   CT ABDOMEN PELVIS W CONTRAST Result Date: 12/14/2023 CLINICAL DATA:  Right upper quadrant abdominal pain EXAM: CT ABDOMEN AND PELVIS WITH CONTRAST TECHNIQUE: Multidetector CT imaging of the abdomen and pelvis was performed using the standard protocol following bolus administration of intravenous contrast. RADIATION DOSE REDUCTION: This exam was performed according to the departmental dose-optimization program which includes automated exposure control, adjustment of the mA and/or kV according to patient size and/or use of iterative reconstruction technique. CONTRAST:  OMNIPAQUE  IOHEXOL  300 MG/ML  SOLN COMPARISON:  Nov 19, 2022 FINDINGS: Lower chest: Several nodules in both lower lobes are identified, measuring between 3 and 4 mm in the right lower lobe and 3 and 5 mm in the lingula, as well as multiple nodules in the left lower lobe posteromedial segment the largest 9 mm in diameter on image number 8. And in the right lower lobe image number 25 measuring 12 mm in diameter.  These nodules were not identified on the prior CT from 2024 there are 4 suspicious for metastatic lung disease. Hepatobiliary: Liver appears normal in size Janice parenchymal lesions however, gallbladder appears irregular, along the fundus of the gallbladder with a suggestion of capsular thickening measuring 2.4 x 0.9 cm. In addition the gallbladder appears inseparable from the adjacent portion of the liver, the possibility of gallbladder carcinoma should be considered and excluded clinically. Correlation with recent ultrasound demonstrates poor visualization of the gallbladder. The findings are not typical for acute cholecystitis in the CT images. Correlation with MR and MRCP is recommended. Pancreas: Unremarkable. Janice pancreatic ductal dilatation or surrounding inflammatory changes. Spleen: Normal in size without focal abnormality. Adrenals/Urinary Tract: Adrenal glands are unremarkable. Kidneys are normal, without renal calculi, focal lesion, or hydronephrosis. Bladder is unremarkable. Stomach/Bowel: There is mesenteric nodular thickening along the mid lower abdominal cavity better seen on images 56-60 and may suggest peritoneal carcinomatosis. Soft tissue nodular masses noted in the left lower quadrant region of the pelvis on image number 65 measuring 3.6 x 2.7 cm this could correlate with an ovarian neoplastic lesion. Again not present on prior examination 2024 Reproductive: Prior hysterectomy Other: Janice abdominal wall hernia or abnormality. Janice abdominopelvic ascites. Musculoskeletal: Extensive postsurgical changes of the thoracolumbar spine with intervertebral disc replacements L2-L3 L4 and L5. IMPRESSION: *Multiple pulmonary nodules in the lower lobes bilaterally suspicious for metastatic lung disease. *Irregular appearance of the gallbladder with a suggestion of capsular thickening along the fundus of the gallbladder. In addition the gallbladder appears inseparable from the adjacent portion of the liver, the  possibility of gallbladder carcinoma should be considered and excluded clinically. Correlation with recent ultrasound demonstrates poor visualization of the gallbladder. The findings are not typical for acute cholecystitis in the CT images. Correlation with MR and MRCP is recommended. *Mesenteric nodular thickening along the mid lower abdominal cavity may suggest peritoneal carcinomatosis. *Soft tissue nodular mass in the left lower quadrant region of the pelvis measuring 3.6 x 2.7 cm this could correlate with an ovarian neoplastic lesion. Again not present on prior examination 2024. *Extensive postsurgical changes of the thoracolumbar spine with intervertebral disc replacements L2-L3 L4 and L5. Electronically Signed   By: Fredrich Jefferson M.D.   On: 12/14/2023 15:18   US  Abdomen Limited RUQ (LIVER/GB) Result Date: 12/14/2023 CLINICAL DATA:  [id number] RUQ pain 151471 EXAM: ULTRASOUND ABDOMEN LIMITED RIGHT UPPER QUADRANT COMPARISON:  Nov 19, 2022 FINDINGS: Gallbladder: The gallbladder was not well visualized or evaluated. The sonographer reports right upper quadrant pain in this region during the  exam. Common bile duct: Diameter: 4 mm Liver: Normal echogenicity. Janice focal lesion identified. Janice intrahepatic biliary ductal dilation. Portal vein is patent on color Doppler imaging with normal direction of blood flow towards the liver. Other: None. IMPRESSION: 1. The gallbladder was not well visualized or evaluated during the exam; however, the sonographer reports right upper quadrant pain in this region. A follow-up CT of the abdomen and pelvis with IV contrast could be considered for further characterization. 2. Janice intrahepatic or extrahepatic biliary ductal dilation. Electronically Signed   By: Rance Burrows M.D.   On: 12/14/2023 13:05    CODE STATUS:  Code Status History     Date Active Date Inactive Code Status Order ID Comments User Context   12/14/2023 1601 12/16/2023 2004 Limited: Do not attempt resuscitation (DNR)  -DNR-LIMITED -Do Not Intubate/DNI  [id number]  Maggie Schooner, MD ED   09/17/2022 1236 09/18/2022 1519 Full Code [id number]  Gearl Keens, MD Inpatient   02/27/2021 1602 03/09/2021 2325 Full Code [id number]  Gearl Keens, MD Inpatient   06/27/2020 1110 06/28/2020 0504 Full Code [id number]  Aundra Lee, MD HOV    Questions for Most Recent Historical Code Status (Order [id number])     Question Answer   If pulseless and not breathing Janice CPR or chest compressions.   In Pre-Arrest Conditions (Patient Is Breathing and Has A Pulse) Do not intubate. Provide all appropriate non-invasive medical interventions. Avoid ICU transfer unless indicated or required.   Consent: Discussion documented in EHR or advanced directives reviewed                Advance Directive Documentation    Flowsheet Row Most Recent Value  Type of Advance Directive Healthcare Power of Attorney, Living will  Pre-existing out of facility DNR order (yellow form or pink MOST form) --  MOST Form in Place? --    Janice orders of the defined types were placed in this encounter.    Future Appointments  Date Time Provider Department Center  01/01/2024  8:00 AM [facility] INFUSION [facility] None  01/01/2024  9:00 AM Woody Heading, RD [facility] None  01/06/2024 11:00 AM Janice Generous, RN [facility] None  01/07/2024  8:15 AM [facility] LAB [facility] None  01/07/2024  8:45 AM Damyon Mullane, MD [facility] None  01/07/2024 12:30 PM [facility] ROOM [facility] None  01/07/2024  2:30 PM [facility] 1 [facility] [place]  01/08/2024  8:45 AM [facility] INFUSION [facility] None  01/11/2024 10:45 AM [facility] INFUSION [facility] None  03/04/2024  8:30 AM Dewald, Francyne Islam, MD [facility] None      This document was completed utilizing speech recognition software. Grammatical errors, random word insertions, pronoun errors, and incomplete sentences are an occasional consequence of this system due to software limitations, ambient noise, and hardware  issues. Any formal questions or concerns about the content, text or information contained within the body of this dictation should be directly addressed to the provider for clarification.

## 2024-01-01 ENCOUNTER — Inpatient Hospital Stay: Admitting: Dietician

## 2024-01-01 ENCOUNTER — Encounter: Payer: Self-pay | Admitting: General Practice

## 2024-01-01 ENCOUNTER — Inpatient Hospital Stay

## 2024-01-01 VITALS — BP 136/52 | HR 62 | Temp 97.8°F | Resp 18

## 2024-01-01 DIAGNOSIS — Z5112 Encounter for antineoplastic immunotherapy: Secondary | ICD-10-CM | POA: Diagnosis not present

## 2024-01-01 DIAGNOSIS — C23 Malignant neoplasm of gallbladder: Secondary | ICD-10-CM

## 2024-01-01 MED ORDER — OXYCODONE HCL 5 MG PO TABS
10.0000 mg | ORAL_TABLET | Freq: Once | ORAL | Status: AC
  Administered 2024-01-01: 10 mg via ORAL
  Filled 2024-01-01: qty 2

## 2024-01-01 MED ORDER — SODIUM CHLORIDE 0.9 % IV SOLN: INTRAVENOUS | Status: DC

## 2024-01-01 MED ORDER — SODIUM CHLORIDE 0.9 % IV SOLN
1000.0000 mg/m2 | Freq: Once | INTRAVENOUS | Status: AC
  Administered 2024-01-01: 1786 mg via INTRAVENOUS
  Filled 2024-01-01: qty 46.97

## 2024-01-01 MED ORDER — SODIUM CHLORIDE 0.9 % IV SOLN
1500.0000 mg | Freq: Once | INTRAVENOUS | Status: AC
  Administered 2024-01-01: 1500 mg via INTRAVENOUS
  Filled 2024-01-01: qty 30

## 2024-01-01 MED ORDER — POTASSIUM CHLORIDE IN NACL 20-0.9 MEQ/L-% IV SOLN
Freq: Once | INTRAVENOUS | Status: AC
  Filled 2024-01-01: qty 1000

## 2024-01-01 MED ORDER — MAGNESIUM SULFATE 2 GM/50ML IV SOLN
2.0000 g | Freq: Once | INTRAVENOUS | Status: AC
  Administered 2024-01-01: 2 g via INTRAVENOUS
  Filled 2024-01-01: qty 50

## 2024-01-01 MED ORDER — SODIUM CHLORIDE 0.9 % IV SOLN
25.0000 mg/m2 | Freq: Once | INTRAVENOUS | Status: AC
  Administered 2024-01-01: 50 mg via INTRAVENOUS
  Filled 2024-01-01: qty 50

## 2024-01-01 MED ORDER — APREPITANT 130 MG/18ML IV EMUL
130.0000 mg | Freq: Once | INTRAVENOUS | Status: AC
  Administered 2024-01-01: 130 mg via INTRAVENOUS
  Filled 2024-01-01: qty 18

## 2024-01-01 MED ORDER — DEXAMETHASONE SODIUM PHOSPHATE 10 MG/ML IJ SOLN
10.0000 mg | Freq: Once | INTRAMUSCULAR | Status: AC
  Administered 2024-01-01: 10 mg via INTRAVENOUS
  Filled 2024-01-01: qty 1

## 2024-01-01 MED ORDER — PALONOSETRON HCL INJECTION 0.25 MG/5ML
0.2500 mg | Freq: Once | INTRAVENOUS | Status: AC
  Administered 2024-01-01: 0.25 mg via INTRAVENOUS
  Filled 2024-01-01: qty 5

## 2024-01-01 NOTE — Progress Notes (Signed)
 Nutrition Assessment   Reason for Assessment: +MST (wt loss)   ASSESSMENT: 78 year old female with newly diagnosed stage IV bladder cancer mets to peritoneum and lungs. She is receiving palliative chemotherapy with cisplatin and gemcitabine (first 6/20). Patient is under the care of Dr. Randye Buttner  Past medical history includes HTN, hardening of aorta, COPD with asthma, GERD, fatty liver, bilateral adrenal adenomas, lumbago with sciatica, DDD, osteoporosis, HLD, vit D deficiency, GAD, constipation  Met with patient in infusion. She is doing well today. Snacking on graham crackers at visit. Pt endorses good appetite. Typically eats 3 small meals. Has eggs for breakfast, tomato/may sandwich for lunch. Ate hibachi chicken/steak/shrimp with rice for dinner.  She drinks premier protein occasionally. Pt does not like water. Unable to tolerate on empty stomach. Says it makes her nauseous. Pt drinks ~40 ounces of diet pepsi. She does not like milk or milky supplements. Pt reports intentional wt loss prior to diagnosis per cardiologist advise. Recalls taking a pill that would make her vomit if she over ate. She did not have emesis while taking, however reports decreased appetite and nausea. Pt has discontinued medication since diagnosis. She is taking miralax  for constipation as needed. Had a BM last night.   Nutrition Focused Physical Exam: deferred    Medications: librium , decadron , colace, toprol , morphine , prilosec, zofran , oxycodone , miralax , compazine , crestor   Labs: glucose 115  Anthropometrics: Intentional 9% wt loss in 6 months   Height: 5'2 Weight: 166 lb 3.2 oz  UBW: 182 lb 6.4 oz (12/12) BMI: 30.4   NUTRITION DIAGNOSIS: Food and nutrition related knowledge deficit related to newly diagnosed cancer as evidenced by no prior need for associated nutrition information   INTERVENTION:  Discussed importance of adequate calories and protein to maintain strength/weights during  treatment Encourage small frequent meals/snacks + daily premier protein - sample of Boost Breeze provided + snack ideas Discussed foods with protein - recommend protein source at every meal - handout provided Discussed importance of hydration, offered ideas on ways to change flavor to water as well as other sources of hydration - pt will work to decrease intake of pepsi + switch to decaf - handout provided  Bowel regimen per MD Contact information provided   MONITORING, EVALUATION, GOAL: Pt will tolerate adequate calories and protein to minimize further wt loss during treatment    Next Visit: Friday June 27 during infusion

## 2024-01-01 NOTE — Progress Notes (Signed)
 PATIENT NAVIGATOR PROGRESS NOTE  Name: Janice Pace Date: 01/01/2024 MRN: [id number]  DOB: December 31, 1945   Reason for visit:  First day of chemo  Comments:   Met with patient in infusion room today.  Patient here for first day of chemo treatment.  Informed patient this is the end of my role in her care.  Going forward, patient is to call Cancer Center main number and follow the prompts to speak to Dr. Denette Finner nurse with any future questions, concerns, or needs medication refills.  Patient verbalized understanding. Patient is scheduled for port-a-cath placement next week.  Will follow in background to make sure port a cath placement is completed.    Time spent counseling/coordinating care: 15-30 minutes

## 2024-01-01 NOTE — Patient Instructions (Signed)
 [facility] - A DEPT OF [facility]  Discharge Instructions: Thank you for choosing [facility] to provide your oncology and hematology care.   If you have a lab appointment with the Cancer Center, please go directly to the Cancer Center and check in at the registration area.   Wear comfortable clothing and clothing appropriate for easy access to any Portacath or PICC line.   We strive to give you quality time with your provider. You may need to reschedule your appointment if you arrive late (15 or more minutes).  Arriving late affects you and other patients whose appointments are after yours.  Also, if you miss three or more appointments without notifying the office, you may be dismissed from the clinic at the provider's discretion.      For prescription refill requests, have your pharmacy contact our office and allow 72 hours for refills to be completed.    Today you received the following chemotherapy and/or immunotherapy agents: Durvalumab (Imfinzi), Gemcitabine (Gemzar), Cisplatin   To help prevent nausea and vomiting after your treatment, we encourage you to take your nausea medication as directed.  BELOW ARE SYMPTOMS THAT SHOULD BE REPORTED IMMEDIATELY: *FEVER GREATER THAN 100.4 F (38 C) OR HIGHER *CHILLS OR SWEATING *NAUSEA AND VOMITING THAT IS NOT CONTROLLED WITH YOUR NAUSEA MEDICATION *UNUSUAL SHORTNESS OF BREATH *UNUSUAL BRUISING OR BLEEDING *URINARY PROBLEMS (pain or burning when urinating, or frequent urination) *BOWEL PROBLEMS (unusual diarrhea, constipation, pain near the anus) TENDERNESS IN MOUTH AND THROAT WITH OR WITHOUT PRESENCE OF ULCERS (sore throat, sores in mouth, or a toothache) UNUSUAL RASH, SWELLING OR PAIN  UNUSUAL VAGINAL DISCHARGE OR ITCHING   Items with * indicate a potential emergency and should be followed up as soon as possible or go to the Emergency Department if any problems should occur.  Please show the  CHEMOTHERAPY ALERT CARD or IMMUNOTHERAPY ALERT CARD at check-in to the Emergency Department and triage nurse.  Should you have questions after your visit or need to cancel or reschedule your appointment, please contact [facility] - A DEPT OF [facility]  Dept: [phone]  and follow the prompts.  Office hours are 8:00 a.m. to 4:30 p.m. Monday - Friday. Please note that voicemails left after 4:00 p.m. may not be returned until the following business day.  We are closed weekends and major holidays. You have access to a nurse at all times for urgent questions. Please call the main number to the clinic Dept: [phone] and follow the prompts.   For any non-urgent questions, you may also contact your provider using MyChart. We now offer e-Visits for anyone 35 and older to request care online for non-urgent symptoms. For details visit [email or web address].   Also download the MyChart app! Go to the app store, search MyChart, open the app, select [facility], and log in with your MyChart username and password.

## 2024-01-01 NOTE — Progress Notes (Signed)
 [facility] Spiritual Care Note  Visited with Janice Pace in infusion to meet in person after connecting via phone. She reports strong coping infrastructure, including supportive family (children, grands, great-grands, who are her heart) and church community (especially a Bible study small group), as well as grounding creative hobbies. She notes that her symptoms are well managed (with morphine  and nausea-fighting watch).   Provided compassionate presence, reflective listening, emotional support, affirmation of strengths (including faith, peace, and positive outlook), and prayer per request.  We plan to follow up at a future treatment.  [address] [name], [place], [facility] Pager [phone] Voicemail [phone]

## 2024-01-02 ENCOUNTER — Other Ambulatory Visit (HOSPITAL_COMMUNITY): Payer: Self-pay

## 2024-01-04 ENCOUNTER — Telehealth: Payer: Self-pay

## 2024-01-04 NOTE — Telephone Encounter (Signed)
 Returned patient's call early this afternoon addressing her concern. Patient had call after hours triage nurse last Saturday and reported hands with blisters since chemo tx last Friday. Denied itching or burning but that the blisters were starting to break. Also reported that hands were swollen. Upon following up, patient reported that she had called her PCP who recommended she apply some hydrocortisone cream to affected areas. Patient said she was much improved and that the blisters were much less noticeable. Patient was advised to contact our office if the problem persists worsens. Dr. Autumn aware.

## 2024-01-06 ENCOUNTER — Other Ambulatory Visit: Payer: Self-pay | Admitting: Radiology

## 2024-01-06 ENCOUNTER — Other Ambulatory Visit

## 2024-01-06 ENCOUNTER — Other Ambulatory Visit: Payer: Self-pay

## 2024-01-06 NOTE — Progress Notes (Signed)
 The proposed treatment discussed in conference is for discussion purpose only and is not a binding recommendation.  The patients have not been physically examined, or presented with their treatment options.  Therefore, final treatment plans cannot be decided.

## 2024-01-06 NOTE — Transitions of Care (Post Inpatient/ED Visit) (Signed)
 Transition of Care week 3  Visit Note  01/06/2024  Name: Janice Pace MRN: [id number]          DOB: 06-03-1946  Situation: Patient enrolled in [facility] 30-day program. Visit completed with pt by telephone.   Background: Recent diagnosis of cancer  Initial Transition Care Management Follow-up Telephone Call    Past Medical History:  Diagnosis Date   Arthritis    Asthma    Bronchitis    Cancer (HCC)    dermatosivros arcoma and protuverans   Carpal tunnel syndrome, left upper limb 11/06/2017   Chronic back pain    degenerative disc disease   Constipation    stool softener daily   Constipation    COPD (chronic obstructive pulmonary disease) (HCC)    GERD (gastroesophageal reflux disease)    takes Prilosec daily   GERD (gastroesophageal reflux disease)    Heart murmur    Heart valve disorder    Hemorrhoids    History of hiatal hernia    Ileus (HCC) 03/02/2021   Insomnia    d/t chantix    Joint pain    Nocturia    Ovarian failure 05/28/2022   Personal history of colonic adenoma 03/13/2003   03/13/2003 - 5 mm adenoma   Pre-diabetes    Prediabetes    White coat syndrome without diagnosis of hypertension     Assessment: Patient reports in general doing well except tired at the end of the day.  Patient Reported Symptoms: Cognitive Cognitive Status: Able to follow simple commands, Alert and oriented to person, place, and time, Normal speech and language skills      Neurological Neurological Review of Symptoms: No symptoms reported    HEENT HEENT Symptoms Reported: No symptoms reported      Cardiovascular Cardiovascular Symptoms Reported: No symptoms reported    Respiratory Respiratory Symptoms Reported: No symptoms reported    Endocrine Patient reports the following symptoms related to hypoglycemia or hyperglycemia : No symptoms reported    Gastrointestinal Gastrointestinal Symptoms Reported: Other Other Gastrointestinal Symptoms: Reports that she is wearing a motion  sickness watch to help with nauea. Gastrointestinal Self-Management Outcome: 3 (uncertain) Gastrointestinal Comment: Reviewed importance of staying hydrated and monitoring frequency of BM's.    Genitourinary Genitourinary Symptoms Reported: Pain with urination Additional Genitourinary Details: Reports decrease in symptoms and is taking her Cipro  as directed Genitourinary Conditions: Urinary tract infection Genitourinary Management Strategies: Medication therapy Genitourinary Comment: Encoraged patient to complete her Cipro  and increase her water intake.  Integumentary Integumentary Symptoms Reported: No symptoms reported    Musculoskeletal Musculoskelatal Symptoms Reviewed: No symptoms reported        Psychosocial Psychosocial Symptoms Reported: No symptoms reported         There were no vitals filed for this visit.  Medications Reviewed Today     Reviewed by Rumalda Alan PENNER, RN (Registered Nurse) on 01/06/24 at 1133  Med List Status: <None>   Medication Order Taking? Sig Documenting Provider Last Dose Status Informant  albuterol  (VENTOLIN  HFA) 108 (90 Base) MCG/ACT inhaler [id number] Yes Inhale 1-2 puffs into the lungs every 6 (six) hours as needed for wheezing or shortness of breath. Malachy Comer GAILS, NP  Active Self  aspirin  81 MG EC tablet [id number] Yes Take 81 mg by mouth daily. [provider]  Active Self  budesonide  (PULMICORT ) 0.5 MG/2ML nebulizer solution [id number] Yes Generic for Pulmicort . Inhale one vial in nebulizer twice a day. Rinse mouth after use. Kara Dorn NOVAK, MD  Active Self  chlordiazePOXIDE  (LIBRIUM ) 10 MG capsule [id number] Yes Take 1 capsule (10 mg total) by mouth as needed for anxiety (max of twice daily). Laurence Locus, DO  Active   ciprofloxacin  (CIPRO ) 500 MG tablet [id number] Yes Take 1 tablet (500 mg total) by mouth 2 (two) times daily. Pasam, Chinita, MD  Active   dexamethasone  (DECADRON ) 4 MG tablet [id number] Yes Take 2 tablets (8 mg) by mouth  daily x 3 days starting the day after cisplatin  chemotherapy. Take with food. Pasam, Chinita, MD  Active   docusate sodium  (COLACE) 100 MG capsule [id number] Yes Take 500 mg by mouth at bedtime. [provider]  Active Self  formoterol  (PERFOROMIST ) 20 MCG/2ML nebulizer solution [id number] Yes Substituted for: Perforomist  Solution Inhale one vial in nebulizer twice a day. Kara Dorn NOVAK, MD  Active Self  lidocaine -prilocaine  (EMLA ) cream [id number] Yes Apply to affected area once Pasam, Avinash, MD  Active   metoprolol  succinate (TOPROL -XL) 50 MG 24 hr tablet [id number] Yes TAKE 1 TABLET BY MOUTH ONCE DAILY. TAKE WITH OR IMMEDIATELY FOLLOWING A MEAL Chandra Toribio POUR, MD  Active Self  morphine  (MS CONTIN ) 15 MG 12 hr tablet [id number] Yes Take 1 tablet (15 mg total) by mouth every 12 (twelve) hours. Autumn Chinita, MD  Active   JESSE SCHLOSSMAN [id number]  Take 8 oz by mouth See admin instructions. Mushroom coffee: Drink 8-10 ounces by mouth once a day as needed to aid bowel movements  Patient not taking: Reported on 01/06/2024   [provider]  Active Self  omeprazole  (PRILOSEC) 20 MG capsule [id number] Yes Take 1 capsule (20 mg total) by mouth 2 (two) times daily before a meal. Clapp, Kara F, PA-C  Active   ondansetron  (ZOFRAN ) 4 MG tablet [id number] Yes Take 1 tablet (4 mg total) by mouth every 6 (six) hours as needed for nausea. Laurence Locus, DO  Active   ondansetron  (ZOFRAN ) 8 MG tablet [id number] Yes Take 1 tablet (8 mg total) by mouth every 8 (eight) hours as needed for nausea or vomiting. Start on the third day after cisplatin . Pasam, Chinita, MD  Active   oxyCODONE  10 MG TABS [id number] Yes Take 1-1.5 tablets (10-15 mg total) by mouth every 4 (four) hours as needed for moderate pain (pain score 4-6) or severe pain (pain score 7-10). Laurence Locus, DO  Active   polyethylene glycol (MIRALAX  / GLYCOLAX ) 17 g packet [id number] Yes Take 17 g by mouth 2 (two) times daily. [provider]   Active   prochlorperazine  (COMPAZINE ) 10 MG tablet [id number] Yes Take 1 tablet (10 mg total) by mouth every 6 (six) hours as needed (Nausea or vomiting). Pasam, Chinita, MD  Active   revefenacin  (YUPELRI ) 175 MCG/3ML nebulizer solution [id number] Yes Take 3 mLs (175 mcg total) by nebulization daily. Kara Dorn NOVAK, MD  Active Self  rosuvastatin  (CRESTOR ) 10 MG tablet [id number] Yes Take 1 tablet (10 mg total) by mouth daily. Wallace Joesph LABOR, PA  Active Self            Recommendation:   Continue Current Plan of Care  Follow Up Plan:   Telephone follow up appointment date/time:  01/12/2024  at 9am  Alan Ee, RN, BSN, CEN Population Health- Transition of Care Team.  [facility] [phone]

## 2024-01-06 NOTE — H&P (Shared)
 Chief Complaint: Adenocarcinoma of gallbladder - IR consulted for port-a-catheter placement  Referring Provider(s): Dr. Chinita Patten  Supervising Physician: Hughes Simmonds  Patient Status: [facility] - Out-pt  History of Present Illness: Janice Pace is a 78 y.o. female with pmhx COPD, GERD, arthritis, chronic back pain, prediabetes, adenocarcinoma of the gallbladder. 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 Given advanced stage of cancer, oncology reports no curative treatment plan, but intent to begin chemotherapy for palliative treatment. Pt now presenting to IR service for port-a-catheter placement to begin the chemotherapy.  *** Patient is Full Code  Past Medical History:  Diagnosis Date   Arthritis    Asthma    Bronchitis    Cancer (HCC)    dermatosivros arcoma and protuverans   Carpal tunnel syndrome, left upper limb 11/06/2017   Chronic back pain    degenerative disc disease   Constipation    stool softener daily   Constipation    COPD (chronic obstructive pulmonary disease) (HCC)    GERD (gastroesophageal reflux disease)    takes Prilosec daily   GERD (gastroesophageal reflux disease)    Heart murmur    Heart valve disorder    Hemorrhoids    History of hiatal hernia    Ileus (HCC) 03/02/2021   Insomnia    d/t chantix    Joint pain    Nocturia    Ovarian failure 05/28/2022   Personal history of colonic adenoma 03/13/2003   03/13/2003 - 5 mm adenoma   Pre-diabetes    Prediabetes    White coat syndrome without diagnosis of hypertension     Past Surgical History:  Procedure Laterality Date   ABDOMINAL HYSTERECTOMY     ANTERIOR LAT LUMBAR FUSION N/A 02/27/2021   Procedure: Anterior lateral interbody fusion - lateral two - lateral three - lateral three - lateral four with exploration fusion L4-S1;  Surgeon: Onetha Kuba, MD;  Location: [facility] OR;  Service: Neurosurgery;   Laterality: N/A;   BACK SURGERY  1988   BACK SURGERY  09/03/2011   rods,screws x 8    BACK SURGERY     09/2022   BUNIONECTOMY     bilateral   BUNIONECTOMY     bil feet   COLONOSCOPY     dermatosibrosarcoma protuberans     LAMINECTOMY WITH POSTERIOR LATERAL ARTHRODESIS LEVEL 3 N/A 02/27/2021   Procedure: Posterior augmentation with pedicle screws Latreal one - lateral four - Posterior Lateral and Interbody fusion;  Surgeon: Onetha Kuba, MD;  Location: [facility] OR;  Service: Neurosurgery;  Laterality: N/A;   PARTIAL HYSTERECTOMY     RECTOCELE REPAIR     Rectum Repair     RESECTION TUMOR WRIST RADICAL     x 2 rt   TUBAL LIGATION  1974   TUBAL LIGATION     WRIST SURGERY     left    Allergies: Sulfa antibiotics, Forteo  [parathyroid hormone (recomb)], Atorvastatin, and Metformin hcl  Medications: Prior to Admission medications   Medication Sig Start Date End Date Taking? Authorizing Provider  albuterol  (VENTOLIN  HFA) 108 (90 Base) MCG/ACT inhaler Inhale 1-2 puffs into the lungs every 6 (six) hours as needed for wheezing or shortness of breath. 05/06/22   Cobb, Comer GAILS, NP  aspirin  81 MG EC tablet Take 81 mg by mouth daily.    [provider]  budesonide  (PULMICORT ) 0.5 MG/2ML nebulizer solution Generic for Pulmicort . Inhale one vial  in nebulizer twice a day. Rinse mouth after use. 01/19/23   Kara Dorn NOVAK, MD  chlordiazePOXIDE  (LIBRIUM ) 10 MG capsule Take 1 capsule (10 mg total) by mouth as needed for anxiety (max of twice daily). 12/16/23 01/15/24  Laurence Locus, DO  ciprofloxacin  (CIPRO ) 500 MG tablet Take 1 tablet (500 mg total) by mouth 2 (two) times daily. 12/31/23   Pasam, Chinita, MD  dexamethasone  (DECADRON ) 4 MG tablet Take 2 tablets (8 mg) by mouth daily x 3 days starting the day after cisplatin  chemotherapy. Take with food. 12/24/23   Pasam, Chinita, MD  docusate sodium  (COLACE) 100 MG capsule Take 500 mg by mouth at bedtime.    [provider]  formoterol   (PERFOROMIST ) 20 MCG/2ML nebulizer solution Substituted for: Perforomist  Solution Inhale one vial in nebulizer twice a day. 01/19/23   Kara Dorn NOVAK, MD  lidocaine -prilocaine  (EMLA ) cream Apply to affected area once 12/24/23   Pasam, Avinash, MD  metoprolol  succinate (TOPROL -XL) 50 MG 24 hr tablet TAKE 1 TABLET BY MOUTH ONCE DAILY. TAKE WITH OR IMMEDIATELY FOLLOWING A MEAL 11/11/23   Chandra Toribio POUR, MD  morphine  (MS CONTIN ) 15 MG 12 hr tablet Take 1 tablet (15 mg total) by mouth every 12 (twelve) hours. 12/25/23   Pasam, Chinita, MD  NON FORMULARY Take 8 oz by mouth See admin instructions. Mushroom coffee: Drink 8-10 ounces by mouth once a day as needed to aid bowel movements Patient not taking: Reported on 01/06/2024    [provider]  omeprazole  (PRILOSEC) 20 MG capsule Take 1 capsule (20 mg total) by mouth 2 (two) times daily before a meal. 12/22/23   Clapp, Kara F, PA-C  ondansetron  (ZOFRAN ) 4 MG tablet Take 1 tablet (4 mg total) by mouth every 6 (six) hours as needed for nausea. 12/16/23   Laurence Locus, DO  ondansetron  (ZOFRAN ) 8 MG tablet Take 1 tablet (8 mg total) by mouth every 8 (eight) hours as needed for nausea or vomiting. Start on the third day after cisplatin . 12/24/23   Pasam, Chinita, MD  oxyCODONE  10 MG TABS Take 1-1.5 tablets (10-15 mg total) by mouth every 4 (four) hours as needed for moderate pain (pain score 4-6) or severe pain (pain score 7-10). 12/16/23   Laurence Locus, DO  polyethylene glycol (MIRALAX  / GLYCOLAX ) 17 g packet Take 17 g by mouth 2 (two) times daily.    [provider]  prochlorperazine  (COMPAZINE ) 10 MG tablet Take 1 tablet (10 mg total) by mouth every 6 (six) hours as needed (Nausea or vomiting). 12/24/23   Pasam, Chinita, MD  revefenacin  (YUPELRI ) 175 MCG/3ML nebulizer solution Take 3 mLs (175 mcg total) by nebulization daily. 02/04/23   Kara Dorn NOVAK, MD  rosuvastatin  (CRESTOR ) 10 MG tablet Take 1 tablet (10 mg total) by mouth daily. 08/26/23    Wallace Joesph LABOR, PA     Family History  Problem Relation Age of Onset   Leukemia Mother    Anesthesia problems Neg Hx    Hypotension Neg Hx    Malignant hyperthermia Neg Hx    Pseudochol deficiency Neg Hx    Colon cancer Neg Hx    Colon polyps Neg Hx    Esophageal cancer Neg Hx    Stomach cancer Neg Hx    Rectal cancer Neg Hx     Social History   Socioeconomic History   Marital status: Married    Spouse name: david   Number of children: 3   Years of education: Not on file  Highest education level: GED or equivalent  Occupational History   Occupation: retired  Tobacco Use   Smoking status: Former    Current packs/day: 0.00    Average packs/day: 1 pack/day for 50.0 years (50.0 ttl pk-yrs)    Types: Cigarettes    Start date: 08/16/1961    Quit date: 08/17/2011    Years since quitting: 12.3    Passive exposure: Never   Smokeless tobacco: Never  Vaping Use   Vaping status: Never Used  Substance and Sexual Activity   Alcohol use: No    Comment: occasional wine   Drug use: No   Sexual activity: Yes    Birth control/protection: Surgical, Other-see comments  Other Topics Concern   Not on file  Social History Narrative   ** Merged History Encounter **       Social Drivers of Health   Financial Resource Strain: Low Risk  (08/25/2023)   Overall Financial Resource Strain (CARDIA)    Difficulty of Paying Living Expenses: Not hard at all  Food Insecurity: No Food Insecurity (12/24/2023)   Hunger Vital Sign    Worried About Running Out of Food in the Last Year: Never true    Ran Out of Food in the Last Year: Never true  Transportation Needs: No Transportation Needs (12/24/2023)   PRAPARE - Administrator, Civil Service (Medical): No    Lack of Transportation (Non-Medical): No  Physical Activity: Inactive (08/25/2023)   Exercise Vital Sign    Days of Exercise per Week: 0 days    Minutes of Exercise per Session: 0 min  Stress: No Stress Concern Present  (08/25/2023)   [employer] of Occupational Health - Occupational Stress Questionnaire    Feeling of Stress : Not at all  Social Connections: Socially Integrated (12/14/2023)   Social Connection and Isolation Panel    Frequency of Communication with Friends and Family: More than three times a week    Frequency of Social Gatherings with Friends and Family: Once a week    Attends Religious Services: More than 4 times per year    Active Member of [employer] or Organizations: Yes    Attends Engineer, structural: More than 4 times per year    Marital Status: Married     Review of Systems: A 12 point ROS discussed and pertinent positives are indicated in the HPI above.  All other systems are negative.  Review of Systems  Vital Signs: There were no vitals taken for this visit.  Advance Care Plan: The advanced care place/surrogate decision maker was discussed at the time of visit and the patient did not wish to discuss or was not able to name a surrogate decision maker or provide an advance care plan.  Physical Exam  Imaging: NM PET Image Initial (PI) Skull Base To Thigh Result Date: 12/29/2023 CLINICAL DATA:  Initial treatment strategy for gallbladder carcinoma. EXAM: NUCLEAR MEDICINE PET SKULL BASE TO THIGH TECHNIQUE: 9.05 mCi F-18 FDG was injected intravenously. Full-ring PET imaging was performed from the skull base to thigh after the radiotracer. CT data was obtained and used for attenuation correction and anatomic localization. Fasting blood glucose: 118 mg/dl COMPARISON:  MRI abdomen and CT abdomen pelvis 12/14/2023. Chest CT 12/23/2022. FINDINGS: Mediastinal blood pool activity: SUV max 2.8 Liver activity: SUV max 2.9 NECK: No specific abnormal uptake seen above blood pool in the neck including along lymph node change of the submandibular, posterior triangle or internal jugular region. Near symmetric uptake of the  visualized intracranial compartment. Incidental CT findings: The  parotid glands, submandibular glands and thyroid  gland are unremarkable scattered vascular calcifications in the neck. Mastoid air cells are clear. The visualized paranasal sinuses are clear. CHEST: There is no abnormal uptake seen above blood pool in the axillary regions, hilum or mediastinum. Slight uptake along the lower esophagus but there is a small hiatal hernia. Lungs demonstrate several scattered nodules bilaterally. Many of which are under 5 mm but there are some additional dominant foci seen. Example right lower lobe medially measuring 11 mm on series 6, image 70. This likely other larger foci are hypermetabolic. The specific lesion has maximum SUV of 4.6. Another example medial left lower lobe on image 64 measuring 9 mm in AP dimension has uptake of maximum SUV of 2.4. Again low but the lesions are small. Right upper lobe focus has dimension on image 49 of 9 mm. This abuts the pleura anteriorly and has maximum SUV of 3.7. The lesions are worrisome for lung metastases. Estimating up to 30 nodules are seen in each lung. Incidental CT findings: Some breathing motion. Dependent basilar atelectasis. No consolidation, pneumothorax or effusion. Heart is nonenlarged. Coronary artery calcifications are seen. The thoracic aorta has a normal course and caliber. There are some small mediastinal nodes identified, less than a cm in short axis and not pathologic by size criteria. These do not have abnormal uptake at this time. ABDOMEN/PELVIS: As seen on the prior examinations there is masslike appearance to the gallbladder in the adjacent portion of the liver in segment 4 with uptake. The uptake in segment 4 has maximum SUV of 6.8 consistent with neoplasm. A few small areas elsewhere in left hepatic lobe seen such as image 80 of the PET scan measuring maximum SUV value of 5.1. This area measures up to a cm in size. Several additional foci seen such as image 76, 77. There is also a surface nodule along the right hepatic  lobe laterally which is hypermetabolic of maximum SUV value of 7.0. Slight thickening in this location on image 886 of the liver measuring 7 mm of the capsule. There also hypermetabolic nodules elsewhere including in the mesentery left upper quadrant which has a focus of maximum SUV value of 6.0 and on image 83 of the CT scan measures 11 by 9 mm. Additional area in the anterior pelvic mesentery on image 122 in the midline measures 3.3 x 1.5 cm and has uptake of 5.3 maximum SUV. Other areas as well. Pelvic lesions are identified which are hypermetabolic in the area of the adnexa. These could be additional areas of disease including Krunkenberg tumors. The larger focus on the left on image 127 of the CT scan measures 4.4 x 3.1 cm with maximum SUV of 17.1. The right-sided focus has maximum SUV of 13.3 and a smaller on image 129 measuring 17 by 15 mm. There are few retroperitoneal nodes which have some uptake including aortocaval, portacaval and periaortic. Example left para-aortic node which has maximum SUV of 5.0, on image 96 measures 10 by 7 mm. Portacaval node on image 83 measures 2.5 x 1.3 cm and has maximum SUV of 7.9. Incidental CT findings: Bowel is nondilated. Scattered colonic stool. Few air-fluid levels along the colon. Spleen is nonenlarged. Bilateral adrenal nodular to are identified which are without abnormal uptake. These could be adenomas but are indeterminate on this examination. However these lesions have been present and stable since at least August 2022 demonstrating long-term stability and likely benign etiology. Scattered vascular  calcifications identified along the aorta and branch vessels. Area of potential stenosis along the SMA. Mild mesenteric stranding. No frank ascites. No definite renal or ureteral stones. Underdistended urinary bladder. SKELETON: No abnormal uptake identified along the visualized osseous structures. Incidental CT findings: Significant streak artifact from the extensive  hardware fixation throughout the lower thoracic and lumbar spine extending into the upper sacrum. Curvature and degenerative changes are noted along the spine. Degenerative changes elsewhere as well. Areas of canal encroachment seen suggested along the midthoracic spine such as at T5-6 level. Osteophytes seen extending towards the central canal. Laminectomy changes along the lower lumbar spine as well. IMPRESSION: Focal hypermetabolic mass lesion involving the gallbladder extending into segment 4B of the liver. Additional small areas of uptake in the left hepatic lobe. Please correlate for the finding seen on recent MRI of the liver. Areas of peritoneal carcinomatosis with several mesenteric nodules and liver surface. There is also presence of pelvic lesions which are hypermetabolic, possible drop metastases,Krunkenberg like tumors. Numerous lung nodules identified. Many of which are quite small and have no rim minimal uptake. Some of the larger lesions have more intense uptake in measure up to 11 mm. These are worrisome for multifocal lung metastases. Few hypermetabolic upper abdominal lymph nodes including portacaval and retroperitoneal consistent with spread of disease. Extensive spinal fixation hardware with degenerative changes and laminectomy. Areas of potential stenosis. Electronically Signed   By: Ranell Bring M.D.   On: 12/29/2023 15:25   CT ABDOMINAL MASS BIOPSY Result Date: 12/15/2023 INDICATION: Pelvic mass EXAM: CT-guided core needle biopsy pelvic mass TECHNIQUE: Multidetector CT imaging of the pelvis was performed following the standard protocol without IV contrast. RADIATION DOSE REDUCTION: This exam was performed according to the departmental dose-optimization program which includes automated exposure control, adjustment of the mA and/or kV according to patient size and/or use of iterative reconstruction technique. MEDICATIONS: None. ANESTHESIA/SEDATION: Moderate (conscious) sedation was employed  during this procedure. A total of Versed  1 mg and Fentanyl  100 mcg was administered intravenously by the radiology nurse. Total intra-service moderate Sedation Time: 14 minutes. The patient's level of consciousness and vital signs were monitored continuously by radiology nursing throughout the procedure under my direct supervision. COMPLICATIONS: None immediate. PROCEDURE: Informed written consent was obtained from the patient after a thorough discussion of the procedural risks, benefits and alternatives. All questions were addressed. Maximal Sterile Barrier Technique was utilized including caps, mask, sterile gowns, sterile gloves, sterile drape, hand hygiene and skin antiseptic. A timeout was performed prior to the initiation of the procedure. The patient was placed on the CT gantry in a supine position. Radiopaque markers were placed on the patient's skin near the target lesion. The the patient's pelvis was then imaged, measurements were obtained, and the patient's skin was marked for the best trajectory for percutaneous biopsy. Local anesthesia was achieved with 1% lidocaine  by infiltrative subcutaneous tissue to the level of the abdominal rectus with 1% lidocaine . A stab incision was then created and the biopsy trocar was advanced to the level of the rectus. Further imaging was obtained. The needle was then removed and the biopsy BioPince needle was advanced through the cannula and a biopsy sample was obtained. Further imaging demonstrated no significant hemorrhage. A second core needle biopsy was obtained, but minimal material returned. All needles were removed the patient. Sterile dressing was applied. IMPRESSION: Successful core needle biopsy of an omental mass in the midline in the patient's pelvis. Electronically Signed   By: Cordella Banner  On: 12/15/2023 12:17   MR ABDOMEN MRCP W WO CONTAST Result Date: 12/14/2023 CLINICAL DATA:  Gallbladder malignancy suspected, gallbladder mass, pulmonary  metastases, and peritoneal metastatic disease identified by prior CT EXAM: MRI ABDOMEN WITHOUT AND WITH CONTRAST (INCLUDING MRCP) TECHNIQUE: Multiplanar multisequence MR imaging of the abdomen was performed both before and after the administration of intravenous contrast. Heavily T2-weighted images of the biliary and pancreatic ducts were obtained, and three-dimensional MRCP images were rendered by post processing. CONTRAST:  7mL GADAVIST  GADOBUTROL  1 MMOL/ML IV SOLN COMPARISON:  CT abdomen pelvis, 12/14/2023 FINDINGS: Lower chest: No acute abnormality. Numerous small bilateral pulmonary nodules, better assessed by CT (series 14, image 5). Hepatobiliary: Thickened, contracted gallbladder with a heterogeneous mass arising from the gallbladder fundus, contiguous with the adjacent liver parenchyma of hepatic segment IVB measuring 3.5 x 2.8 cm (series 14, image 37). Multiple small rim enhancing lesions within the liver parenchyma, for example in anterior hepatic segment II measuring 1.2 x 1.1 cm (series 14, image 19). Pancreas: Unremarkable. No pancreatic ductal dilatation or surrounding inflammatory changes. Spleen: Normal in size without significant abnormality. Adrenals/Urinary Tract: Unchanged, definitively benign macroscopic fat containing adrenal adenomata requiring no specific further follow-up or characterization. Kidneys are normal, without obvious renal calculi, solid lesion, or hydronephrosis. Stomach/Bowel: Stomach is within normal limits. No evidence of bowel wall thickening, distention, or inflammatory changes. Vascular/Lymphatic: Aortic atherosclerosis. Enlarged, necrotic appearing portacaval and porta hepatis lymph nodes measuring up 2 2.9 x 1.6 cm (series 14, image 31). Small, although rim enhancing and abnormal appearing retroperitoneal lymph nodes (series 14, image 50). Other: No abdominal wall hernia or abnormality. No ascites. Peritoneal stranding and nodularity, incompletely visualized on this  examination although present in the left upper quadrant (series 14, image 29). Musculoskeletal: No acute or significant osseous findings. IMPRESSION: 1. Thickened, contracted gallbladder with a heterogeneous mass arising from the gallbladder fundus, contiguous with the adjacent liver parenchyma of hepatic segment IVB measuring 3.5 x 2.8 cm. 2. Multiple small rim enhancing lesions within the liver parenchyma. 3. Enlarged, necrotic appearing portacaval and porta hepatis lymph nodes. Small, although rim enhancing and abnormal appearing retroperitoneal lymph nodes. 4. Peritoneal stranding and nodularity, incompletely visualized on this examination although present in the left upper quadrant, consistent with peritoneal metastatic disease. 5. Numerous small bilateral pulmonary nodules, better assessed by CT. 6. 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 Aortic Atherosclerosis (ICD10-I70.0). Electronically Signed   By: Marolyn JONETTA Jaksch M.D.   On: 12/14/2023 21:08   MR 3D Recon At Scanner Result Date: 12/14/2023 CLINICAL DATA:  Gallbladder malignancy suspected, gallbladder mass, pulmonary metastases, and peritoneal metastatic disease identified by prior CT EXAM: MRI ABDOMEN WITHOUT AND WITH CONTRAST (INCLUDING MRCP) TECHNIQUE: Multiplanar multisequence MR imaging of the abdomen was performed both before and after the administration of intravenous contrast. Heavily T2-weighted images of the biliary and pancreatic ducts were obtained, and three-dimensional MRCP images were rendered by post processing. CONTRAST:  7mL GADAVIST  GADOBUTROL  1 MMOL/ML IV SOLN COMPARISON:  CT abdomen pelvis, 12/14/2023 FINDINGS: Lower chest: No acute abnormality. Numerous small bilateral pulmonary nodules, better assessed by CT (series 14, image 5). Hepatobiliary: Thickened, contracted gallbladder with a heterogeneous mass arising from the gallbladder fundus, contiguous with the adjacent  liver parenchyma of hepatic segment IVB measuring 3.5 x 2.8 cm (series 14, image 37). Multiple small rim enhancing lesions within the liver parenchyma, for example in anterior hepatic segment II measuring 1.2 x 1.1 cm (series 14, image 19). Pancreas: Unremarkable. No pancreatic  ductal dilatation or surrounding inflammatory changes. Spleen: Normal in size without significant abnormality. Adrenals/Urinary Tract: Unchanged, definitively benign macroscopic fat containing adrenal adenomata requiring no specific further follow-up or characterization. Kidneys are normal, without obvious renal calculi, solid lesion, or hydronephrosis. Stomach/Bowel: Stomach is within normal limits. No evidence of bowel wall thickening, distention, or inflammatory changes. Vascular/Lymphatic: Aortic atherosclerosis. Enlarged, necrotic appearing portacaval and porta hepatis lymph nodes measuring up 2 2.9 x 1.6 cm (series 14, image 31). Small, although rim enhancing and abnormal appearing retroperitoneal lymph nodes (series 14, image 50). Other: No abdominal wall hernia or abnormality. No ascites. Peritoneal stranding and nodularity, incompletely visualized on this examination although present in the left upper quadrant (series 14, image 29). Musculoskeletal: No acute or significant osseous findings. IMPRESSION: 1. Thickened, contracted gallbladder with a heterogeneous mass arising from the gallbladder fundus, contiguous with the adjacent liver parenchyma of hepatic segment IVB measuring 3.5 x 2.8 cm. 2. Multiple small rim enhancing lesions within the liver parenchyma. 3. Enlarged, necrotic appearing portacaval and porta hepatis lymph nodes. Small, although rim enhancing and abnormal appearing retroperitoneal lymph nodes. 4. Peritoneal stranding and nodularity, incompletely visualized on this examination although present in the left upper quadrant, consistent with peritoneal metastatic disease. 5. Numerous small bilateral pulmonary nodules,  better assessed by CT. 6. 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 Aortic Atherosclerosis (ICD10-I70.0). Electronically Signed   By: Marolyn JONETTA Jaksch M.D.   On: 12/14/2023 21:08   CT ABDOMEN PELVIS W CONTRAST Result Date: 12/14/2023 CLINICAL DATA:  Right upper quadrant abdominal pain EXAM: CT ABDOMEN AND PELVIS WITH CONTRAST TECHNIQUE: Multidetector CT imaging of the abdomen and pelvis was performed using the standard protocol following bolus administration of intravenous contrast. RADIATION DOSE REDUCTION: This exam was performed according to the departmental dose-optimization program which includes automated exposure control, adjustment of the mA and/or kV according to patient size and/or use of iterative reconstruction technique. CONTRAST:  OMNIPAQUE  IOHEXOL  300 MG/ML  SOLN COMPARISON:  Nov 19, 2022 FINDINGS: Lower chest: Several nodules in both lower lobes are identified, measuring between 3 and 4 mm in the right lower lobe and 3 and 5 mm in the lingula, as well as multiple nodules in the left lower lobe posteromedial segment the largest 9 mm in diameter on image number 8. And in the right lower lobe image number 25 measuring 12 mm in diameter. These nodules were not identified on the prior CT from 2024 there are 4 suspicious for metastatic lung disease. Hepatobiliary: Liver appears normal in size no parenchymal lesions however, gallbladder appears irregular, along the fundus of the gallbladder with a suggestion of capsular thickening measuring 2.4 x 0.9 cm. In addition the gallbladder appears inseparable from the adjacent portion of the liver, the possibility of gallbladder carcinoma should be considered and excluded clinically. Correlation with recent ultrasound demonstrates poor visualization of the gallbladder. The findings are not typical for acute cholecystitis in the CT images. Correlation with MR and MRCP is recommended. Pancreas:  Unremarkable. No pancreatic ductal dilatation or surrounding inflammatory changes. Spleen: Normal in size without focal abnormality. Adrenals/Urinary Tract: Adrenal glands are unremarkable. Kidneys are normal, without renal calculi, focal lesion, or hydronephrosis. Bladder is unremarkable. Stomach/Bowel: There is mesenteric nodular thickening along the mid lower abdominal cavity better seen on images 56-60 and may suggest peritoneal carcinomatosis. Soft tissue nodular masses noted in the left lower quadrant region of the pelvis on image number 65 measuring 3.6 x 2.7 cm this could correlate with  an ovarian neoplastic lesion. Again not present on prior examination 2024 Reproductive: Prior hysterectomy Other: No abdominal wall hernia or abnormality. No abdominopelvic ascites. Musculoskeletal: Extensive postsurgical changes of the thoracolumbar spine with intervertebral disc replacements L2-L3 L4 and L5. IMPRESSION: *Multiple pulmonary nodules in the lower lobes bilaterally suspicious for metastatic lung disease. *Irregular appearance of the gallbladder with a suggestion of capsular thickening along the fundus of the gallbladder. In addition the gallbladder appears inseparable from the adjacent portion of the liver, the possibility of gallbladder carcinoma should be considered and excluded clinically. Correlation with recent ultrasound demonstrates poor visualization of the gallbladder. The findings are not typical for acute cholecystitis in the CT images. Correlation with MR and MRCP is recommended. *Mesenteric nodular thickening along the mid lower abdominal cavity may suggest peritoneal carcinomatosis. *Soft tissue nodular mass in the left lower quadrant region of the pelvis measuring 3.6 x 2.7 cm this could correlate with an ovarian neoplastic lesion. Again not present on prior examination 2024. *Extensive postsurgical changes of the thoracolumbar spine with intervertebral disc replacements L2-L3 L4 and L5.  Electronically Signed   By: Franky Chard M.D.   On: 12/14/2023 15:18   US  Abdomen Limited RUQ (LIVER/GB) Result Date: 12/14/2023 CLINICAL DATA:  [id number] RUQ pain 151471 EXAM: ULTRASOUND ABDOMEN LIMITED RIGHT UPPER QUADRANT COMPARISON:  Nov 19, 2022 FINDINGS: Gallbladder: The gallbladder was not well visualized or evaluated. The sonographer reports right upper quadrant pain in this region during the exam. Common bile duct: Diameter: 4 mm Liver: Normal echogenicity. No focal lesion identified. No intrahepatic biliary ductal dilation. Portal vein is patent on color Doppler imaging with normal direction of blood flow towards the liver. Other: None. IMPRESSION: 1. The gallbladder was not well visualized or evaluated during the exam; however, the sonographer reports right upper quadrant pain in this region. A follow-up CT of the abdomen and pelvis with IV contrast could be considered for further characterization. 2. No intrahepatic or extrahepatic biliary ductal dilation. Electronically Signed   By: Rogelia Myers M.D.   On: 12/14/2023 13:05    Labs:  CBC: Recent Labs    12/15/23 0424 12/16/23 0426 12/22/23 0938 12/30/23 1044  WBC 5.2 5.4 7.1 5.4  HGB 11.9* 12.2 15.2 13.3  HCT 36.4 37.2 45.8 38.7  PLT 187 187 292 217    COAGS: Recent Labs    12/14/23 1825  INR 1.1    BMP: Recent Labs    12/14/23 1825 12/15/23 0424 12/16/23 0426 12/22/23 0938 12/30/23 1044  NA  --  136 139 140 141  K  --  3.3* 4.6 4.4 4.2  CL  --  106 107 102 107  CO2  --  25 26 18* 28  GLUCOSE  --  135* 112* 141* 115*  BUN  --  <5* 6* 10 8  CALCIUM   --  8.5* 8.9 10.2 9.4  CREATININE 0.63 0.80 0.80 0.89 0.69  GFRNONAA >60 >60 >60  --  >60    LIVER FUNCTION TESTS: Recent Labs    02/11/23 0845 12/14/23 1044 12/22/23 0938 12/30/23 1044  BILITOT 0.5 0.8 0.9 0.8  AST 49* 19 25 20   ALT 47* 16 22 18   ALKPHOS 108 68 112 80  PROT 6.4 6.3* 7.2 6.6  ALBUMIN  4.1 3.5 4.7 4.1    TUMOR MARKERS: No results for  input(s): AFPTM, CEA, CA199, CHROMGRNA in the last 8760 hours.  Assessment and Plan:  Janice Pace is a 78 y.o. female with pmhx COPD, GERD, arthritis, chronic  back pain, prediabetes, adenocarcinoma of the gallbladder. 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 Given advanced stage of cancer, oncology reports no curative treatment plan, but intent to begin chemotherapy for palliative treatment. Pt now presenting to IR service for port-a-catheter placement to begin the chemotherapy.  Risks and benefits of image-guided Port-a-catheter placement were discussed with the patient including, but not limited to bleeding, infection, pneumothorax, or fibrin sheath development and need for additional procedures. All of the patient's questions were answered, patient is agreeable to proceed. Consent signed and in chart.   Thank you for allowing our service to participate in Janice Pace 's care.  Electronically Signed: Kimble VEAR Clas, PA-C   01/06/2024, 1:56 PM      I spent a total of {New PWEU:[id number]} {New Out-Pt:[id number]}  {Established Out-Pt:[id number]} in face to face in clinical consultation, greater than 50% of which was counseling/coordinating care for port-a-catheter placement.

## 2024-01-06 NOTE — Patient Instructions (Signed)
 Visit Information  Thank you for taking time to visit with me today. Please don't hesitate to contact me if I can be of assistance to you before our next scheduled telephone appointment.  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  Patient with a positive attitude. Taking medications as prescribed and reports improved appetite. 12/31/2023  Reports some pain during the night and took tylenol . Recent loose stools and stopped Miralax .  Reports decrease in appetite. 01/06/2024  Patient reports that her first CHEMO went well.  Reports that she did develop some blisters on her hands but those have now decreased.  Reports doing well but is tired at the end of the day. Reports pain is managed and under good control.    Goal:  Over the next 30 days, the patient will not experience hospital readmission  Interventions:  Transitions of Care: Doctor Visits  - discussed the importance of doctor visits, reviewed all pending appointments with patient on the phone. Reviewed all pending appointments in Mr Chart and patient is aware and has transportation.  Reviewed importance of eating healthy, Reviewed calling cancer center with any problems or concerns  COPD Interventions:  Encouraged patient to continue to use her maintenance medications as prescribed.  Oncology: Confirmed all appointments and her understanding of reasons for appointments.  Reviewed pain control. Encouraged patient to continue to take her pain medications Reviewed importance of hydration.  Congratulated patient on drinking WATER!!!! Encouraged patient to eat well.  Continue to  take medications as prescribed. Confirmed that patient is taking her cipro  and having decreased UTI symptoms.    Patient Self Care Activities:  Attend all scheduled provider appointments Call pharmacy for medication refills 3-7 days in advance of running out of medications Call provider office for new concerns or questions  Notify RN Care Manager of TOC call rescheduling needs Participate in Transition of Care Program/Attend TOC scheduled calls Take medications as prescribed   eat healthy and drink plenty of fluids Be as active as you can.  Rest when your body tells you too.  Portacath planned for this week and 2nd chemo round.   Plan:  Telephone follow up appointment with care management team member scheduled for:  01/12/2024 appointment scheduled with primary [facility] RN, Alan Ee  The patient has been provided with contact information for the care management team and has been advised to call with any health related questions or concerns.          Our next appointment is by telephone on 01/12/2024  at 0900   Please call the care guide team at [phone] if you need to cancel or reschedule your appointment.   If you are experiencing a Mental Health or Behavioral Health Crisis or need someone to talk to, please call the Suicide and Crisis Lifeline: 988 call the [place]  National Suicide Prevention Lifeline: 226-745-2040 or TTY: (314)507-4185 TTY 442-258-3218) to talk to a trained counselor call 1-800-273-TALK (toll free, 24 hour hotline) call 911   Patient verbalizes understanding of instructions and care plan provided today and agrees to view in MyChart. Active MyChart  status and patient understanding of how to access instructions and care plan via MyChart confirmed with patient.     Alan Ee, RN, BSN, CEN [employer]- Transition of Care Team.  [facility] [phone]

## 2024-01-07 ENCOUNTER — Ambulatory Visit (HOSPITAL_COMMUNITY)
Admission: RE | Admit: 2024-01-07 | Discharge: 2024-01-07 | Disposition: A | Discharge status: Home / Self Care | Source: Ambulatory Visit | Attending: Oncology | Admitting: Oncology

## 2024-01-07 ENCOUNTER — Inpatient Hospital Stay

## 2024-01-07 ENCOUNTER — Encounter (HOSPITAL_COMMUNITY): Payer: Self-pay

## 2024-01-07 ENCOUNTER — Other Ambulatory Visit

## 2024-01-07 ENCOUNTER — Inpatient Hospital Stay (HOSPITAL_BASED_OUTPATIENT_CLINIC_OR_DEPARTMENT_OTHER): Admitting: Oncology

## 2024-01-07 ENCOUNTER — Ambulatory Visit

## 2024-01-07 ENCOUNTER — Other Ambulatory Visit: Payer: Self-pay

## 2024-01-07 ENCOUNTER — Encounter: Payer: Self-pay | Admitting: Oncology

## 2024-01-07 VITALS — BP 110/51 | HR 60 | Temp 97.3°F | Resp 19 | Wt 164.9 lb

## 2024-01-07 DIAGNOSIS — C7800 Secondary malignant neoplasm of unspecified lung: Secondary | ICD-10-CM | POA: Insufficient documentation

## 2024-01-07 DIAGNOSIS — G893 Neoplasm related pain (acute) (chronic): Secondary | ICD-10-CM

## 2024-01-07 DIAGNOSIS — Z5112 Encounter for antineoplastic immunotherapy: Secondary | ICD-10-CM | POA: Diagnosis not present

## 2024-01-07 DIAGNOSIS — Z87891 Personal history of nicotine dependence: Secondary | ICD-10-CM | POA: Insufficient documentation

## 2024-01-07 DIAGNOSIS — C786 Secondary malignant neoplasm of retroperitoneum and peritoneum: Secondary | ICD-10-CM | POA: Insufficient documentation

## 2024-01-07 DIAGNOSIS — C23 Malignant neoplasm of gallbladder: Secondary | ICD-10-CM | POA: Diagnosis not present

## 2024-01-07 HISTORY — PX: IR IMAGING GUIDED PORT INSERTION: IMG5740

## 2024-01-07 LAB — CMP (CANCER CENTER ONLY)
ALT: 30 U/L (ref 0–44)
AST: 28 U/L (ref 15–41)
Albumin: 3.8 g/dL (ref 3.5–5.0)
Alkaline Phosphatase: 70 U/L (ref 38–126)
Anion gap: 6 (ref 5–15)
BUN: 15 mg/dL (ref 8–23)
CO2: 29 mmol/L (ref 22–32)
Calcium: 8.7 mg/dL — ABNORMAL LOW (ref 8.9–10.3)
Chloride: 104 mmol/L (ref 98–111)
Creatinine: 0.85 mg/dL (ref 0.44–1.00)
GFR, Estimated: >60 mL/min (ref >60)
Glucose, Bld: 116 mg/dL — ABNORMAL HIGH (ref 70–99)
Potassium: 3.8 mmol/L (ref 3.5–5.1)
Sodium: 139 mmol/L (ref 135–145)
Total Bilirubin: 0.9 mg/dL (ref 0.0–1.2)
Total Protein: 6.1 g/dL — ABNORMAL LOW (ref 6.5–8.1)

## 2024-01-07 LAB — CBC WITH DIFFERENTIAL (CANCER CENTER ONLY)
Abs Immature Granulocytes: 0.02 10*3/uL (ref 0.00–0.07)
Basophils Absolute: 0 10*3/uL (ref 0.0–0.1)
Basophils Relative: 1 %
Eosinophils Absolute: 0 10*3/uL (ref 0.0–0.5)
Eosinophils Relative: 1 %
HCT: 35.7 % — ABNORMAL LOW (ref 36.0–46.0)
Hemoglobin: 12.2 g/dL (ref 12.0–15.0)
Immature Granulocytes: 1 %
Lymphocytes Relative: 35 %
Lymphs Abs: 1.2 10*3/uL (ref 0.7–4.0)
MCH: 29.1 pg (ref 26.0–34.0)
MCHC: 34.2 g/dL (ref 30.0–36.0)
MCV: 85.2 fL (ref 80.0–100.0)
Monocytes Absolute: 0.1 10*3/uL (ref 0.1–1.0)
Monocytes Relative: 3 %
Neutro Abs: 2.2 10*3/uL (ref 1.7–7.7)
Neutrophils Relative %: 59 %
Platelet Count: 135 10*3/uL — ABNORMAL LOW (ref 150–400)
RBC: 4.19 MIL/uL (ref 3.87–5.11)
RDW: 12.8 % (ref 11.5–15.5)
WBC Count: 3.5 10*3/uL — ABNORMAL LOW (ref 4.0–10.5)
nRBC: 0 % (ref 0.0–0.2)

## 2024-01-07 LAB — MAGNESIUM: Magnesium: 2 mg/dL (ref 1.7–2.4)

## 2024-01-07 MED ORDER — HEPARIN SOD (PORK) LOCK FLUSH 100 UNIT/ML IV SOLN
INTRAVENOUS | Status: AC
  Filled 2024-01-07: qty 5

## 2024-01-07 MED ORDER — FENTANYL CITRATE (PF) 100 MCG/2ML IJ SOLN
INTRAMUSCULAR | Status: AC | PRN
  Administered 2024-01-07: 25 ug via INTRAVENOUS
  Administered 2024-01-07: 50 ug via INTRAVENOUS

## 2024-01-07 MED ORDER — DIPHENHYDRAMINE HCL 50 MG/ML IJ SOLN
INTRAMUSCULAR | Status: AC
  Filled 2024-01-07: qty 1

## 2024-01-07 MED ORDER — DIPHENHYDRAMINE HCL 50 MG/ML IJ SOLN
INTRAMUSCULAR | Status: AC | PRN
  Administered 2024-01-07: 25 mg via INTRAVENOUS

## 2024-01-07 MED ORDER — MIDAZOLAM HCL 2 MG/2ML IJ SOLN
INTRAMUSCULAR | Status: AC
  Filled 2024-01-07: qty 2

## 2024-01-07 MED ORDER — LIDOCAINE-EPINEPHRINE 1 %-1:100000 IJ SOLN
INTRAMUSCULAR | Status: AC
  Filled 2024-01-07: qty 1

## 2024-01-07 MED ORDER — FENTANYL CITRATE (PF) 100 MCG/2ML IJ SOLN
INTRAMUSCULAR | Status: AC
Start: 2024-01-07 — End: 2024-01-07
  Filled 2024-01-07: qty 2

## 2024-01-07 MED ORDER — SODIUM CHLORIDE 0.9 % IV SOLN: INTRAVENOUS | Status: DC

## 2024-01-07 MED ORDER — MIDAZOLAM HCL 2 MG/2ML IJ SOLN
INTRAMUSCULAR | Status: AC | PRN
  Administered 2024-01-07: .5 mg via INTRAVENOUS
  Administered 2024-01-07: 1 mg via INTRAVENOUS

## 2024-01-07 MED ORDER — HEPARIN SOD (PORK) LOCK FLUSH 100 UNIT/ML IV SOLN
500.0000 [IU] | Freq: Once | INTRAVENOUS | Status: AC
  Administered 2024-01-07: 500 [IU] via INTRAVENOUS

## 2024-01-07 MED ORDER — LIDOCAINE-EPINEPHRINE 1 %-1:100000 IJ SOLN
20.0000 mL | Freq: Once | INTRAMUSCULAR | Status: AC
  Administered 2024-01-07: 20 mL via INTRADERMAL

## 2024-01-07 NOTE — Procedures (Signed)
 Vascular and Interventional Radiology Procedure Note  Patient: Janice Pace DOB: [birth year] Medical Record Number: [id number] Note Date/Time: 01/07/24 1:37 PM   Performing Physician: Thom Hall, MD Assistant(s): None  Diagnosis: Gallbladder CA  Procedure: PORT PLACEMENT  Anesthesia: Conscious Sedation Complications: None Estimated Blood Loss: Minimal  Findings:  Successful right-sided port placement, with the tip of the catheter in the proximal right atrium.  Plan: Catheter ready for use.  See detailed procedure note with images in PACS. The patient tolerated the procedure well without incident or complication and was returned to Recovery in stable condition.    Thom Hall, MD [facility] Radiology   Pager. [phone] Clinic. [phone]

## 2024-01-07 NOTE — Discharge Instructions (Addendum)

## 2024-01-07 NOTE — Progress Notes (Signed)
 Patient seen by Dr. Archie Patten Pasam today  Vitals are within treatment parameters:Yes   Labs are within treatment parameters: Yes   Treatment plan has been signed: Yes   Per physician team, Patient is ready for treatment and there are NO modifications to the treatment plan.

## 2024-01-07 NOTE — Assessment & Plan Note (Signed)
 Chronic pain secondary to cancer, managed with oxycodone  10 mg every four hours. Discussed potential for addiction with oxycodone  but emphasized its necessity for pain management. Proposed adding long-acting morphine for consistent pain relief and reducing frequent oxycodone  use. She prefers pill form due to concerns about the patch's initial delay in effectiveness. Discussed risk of constipation with opioid use and need for stool softeners. - Prescribe long-acting morphine for consistent pain control. - Continue oxycodone  for breakthrough pain as needed. - Advise on the use of stool softeners to prevent constipation due to opioid use.

## 2024-01-07 NOTE — Progress Notes (Signed)
  [facility] PROGRESS NOTE   Patient Care Team: Gayle Devere DASEN, NP as PCP - General (Internal Medicine) Pcp, [name], DO (Family Medicine) Pa, [facility] Rumalda Alan PENNER, RN as [facility] Ardis, Evalene CROME, RN as Oncology Nurse Navigator  PATIENT NAME: Janice Pace   MR#: [id number] DOB: 1945/12/16  Date of visit: 01/07/2024   ASSESSMENT & PLAN:   Janice Pace is a 78 y.o. 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   Adenocarcinoma of gallbladder [facility]) Please review oncology history for additional details and timeline of events.  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 Biopsy from the omentum confirmed adenocarcinoma, originating from the gallbladder.  Immunostains support diagnosis of gallbladder carcinoma.    Previously I discussed diagnosis, staging, prognosis, plan of care, treatment options.  Reviewed NCCN guidelines.   Given stage IV disease, all treatment options are palliative in nature and not curative in intent.  This was discussed with patient, her daughter and son-in-law who were accompanying.  They verbalized understanding.    Discussed the aggressive nature of stage IV gallbladder adenocarcinoma and overall poor prognosis.  Life expectancy with treatment is 1 to 1.5 years; without treatment, less than six months.   We submitted NGS panel testing on the specimen and also liquid biopsy for mutation analysis.  Staging PET scan on 12/29/2023 showed known gallbladder mass extending into the liver.  Peritoneal carcinomatosis noted.  Numerous lung nodules were identified, small bowel with FDG uptake, concerning for  metastatic disease in the lungs.  Metastatic lymphadenopathy was noted in the abdomen and retroperitoneum.  This scan will serve as a good baseline for future compression.  Discussed results with the patient and family members previously.   Plan made to proceed with palliative systemic chemoimmunotherapy with gemcitabine , cisplatin  and durvalumab .  She started this from 01/01/2024.  Tolerated first dose well.  No major side effects.  Labs today reveal no dose-limiting toxicities.  Mild leukopenia noted.  Will proceed with cycle 1 day 8 of chemotherapy tomorrow and she will receive G-CSF support later in the week.  - Ensure hydration and nutrition are maintained during treatment.  RTC in 2 weeks for labs, follow-up and continuation of chemotherapy.  Cancer associated pain Chronic pain secondary to cancer, managed with oxycodone  10 mg every four hours. Discussed potential for addiction with oxycodone  but emphasized its necessity for pain management. Proposed adding long-acting morphine  for consistent pain relief and reducing frequent oxycodone  use. She prefers pill form due to concerns about the patch's initial delay in effectiveness. Discussed risk of constipation with opioid use and need for stool softeners. - Prescribe long-acting morphine  for consistent pain control. - Continue oxycodone  for breakthrough pain as needed. - Advise on the use of stool softeners to prevent constipation due to opioid use.    Thrombocytopenia due to chemotherapy Mild thrombocytopenia with platelet count at 135,000, slightly below normal range, expected due to chemotherapy.   I reviewed lab results and outside records for this visit and discussed relevant results with the patient. Diagnosis, plan of care and treatment options were also discussed in detail with the patient. Opportunity provided to ask questions and answers provided to her apparent satisfaction. Provided instructions to call our clinic  with any  problems, questions or concerns prior to return visit. I recommended to continue follow-up with PCP and sub-specialists. She verbalized understanding and agreed with the plan.   NCCN guidelines have been consulted in the planning of this patient's care.  I spent a total of 30 minutes during this encounter with the patient including review of chart and various tests results, discussions about plan of care and coordination of care plan.   Chinita Patten, MD  01/07/2024 2:12 PM  [facility] - A DEPT OF Janice P[facility] [address] [place] [address] Dept: [phone] Dept Fax: [phone]    CHIEF COMPLAINT/ REASON FOR VISIT:   Stage IV gallbladder adenocarcinoma with peritoneal mets and lung mets.  Current Treatment: Palliative systemic chemoimmunotherapy with cisplatin , gemcitabine , durvalumab  started from 01/01/2024.  INTERVAL HISTORY:    Discussed the use of AI scribe software for clinical note transcription with the patient, who gave verbal consent to proceed.  History of Present Illness Janice Pace is a 78 year old female undergoing chemotherapy who presents for follow-up of her treatment and blood counts.  She has experienced a skin rash on her hands and a mild rash on her leg, which has improved with the use of cortisone cream and is no longer present.  She does report some abdominal bloating.  No nausea, vomiting, tingling, numbness, or bowel movement issues. She reports a decreased appetite but is able to eat. She is taking Miralax  to regulate her bowel movements and is making an effort to stay hydrated by drinking water.     I have reviewed the past medical history, past surgical history, social history and family history with the patient and they are unchanged from previous note.  HISTORY OF PRESENT ILLNESS:   ONCOLOGY HISTORY:   78 y.o. lady with past medical history of essential hypertension,  osteoarthritis, COPD/asthma, GERD, depression, anxiety, hiatal hernia, chronic back pain presented to the ED on 12/14/2023 with complaints of worsening epigastric/abdominal pain over 2-3 weeks.  Routine blood work was unremarkable in the ER.  However CT abdomen and pelvis showed irregular appearance of the gallbladder with suggestion of capsular thickening along the fundus of the gallbladder.  Gallbladder appeared inseparable from the adjacent portion of the liver.  Clinical picture was concerning for gallbladder carcinoma.  Mesenteric nodular thickening along the mid lower abdominal cavity indicating peritoneal carcinomatosis.  Soft tissue nodular mass in the left lower quadrant region of the pelvis measuring 3.6 x 2.7 cm, likely ovarian neoplastic lesion.  Multiple lung nodules in the lower lobes bilaterally, largest measuring 12 mm in the right lower lobe, concerning for pulmonary metastatic disease.   She was admitted to the hospital for further evaluation and management.  IR was consulted for peritoneal mass biopsy.  CA 19-9 was significant elevated at 14,368.  CEA was increased at 98.7, CA-125 was also increased at 91.3.   On 12/14/2023, MRI abdomen/MRCP showed thickened, contracted gallbladder with a heterogeneous mass arising from the gallbladder fundus, contiguous with the adjacent liver parenchyma of hepatic segment IVB measuring 3.5 x 2.8 cm. Multiple small rim enhancing lesions within the liver parenchyma. Enlarged, necrotic appearing portacaval and porta hepatis lymph nodes. Small, although rim enhancing and abnormal appearing retroperitoneal lymph nodes. Peritoneal stranding and nodularity, incompletely visualized on this examination although present in the left upper quadrant, consistent with peritoneal metastatic disease. Numerous small bilateral pulmonary nodules, better assessed by CT. Constellation of findings  is consistent with gallbladder malignancy and associated metastatic disease.   On  12/15/2023, she underwent CT-guided biopsy of the omental lesion.  Pathology showed metastatic well to moderately differentiated adenocarcinoma. The tumor is positive for cytokeratin 7 and shows focal weak positivity for the GI marker CDX2.  The tumor is negative for the GI markers cytokeratin 20.  The tumor is also negative for the GU and GYN marker PAX8.  The tumor is negative for the pulmonary adeno marker TTF-1. This immunohistochemical pattern and histomorphology would be compatible with the clinical suspicion of a gallbladder primary.  The differential diagnosis would also include an upper GI or pancreaticobiliary primary.    Patient presented to our clinic on 12/24/2023 to establish care with us .  Request placed for staging PET scan.  NGS panel testing requested on the specimen and also liquid biopsy obtained.  Staging PET scan on 12/29/2023 showed known gallbladder mass extending into the liver.  Peritoneal carcinomatosis noted.  Numerous lung nodules were identified, small bowel with FDG uptake, concerning for metastatic disease in the lungs.  Metastatic lymphadenopathy was noted in the abdomen and retroperitoneum.  This scan will serve as a good baseline for future compression.    Plan for palliative systemic chemoimmunotherapy with cisplatin , gemcitabine , durvalumab . Scheduled to start C1D1 from 01/01/24.   Oncology History  Adenocarcinoma of gallbladder (HCC)  12/24/2023 Initial Diagnosis   Adenocarcinoma of gallbladder (HCC)   12/24/2023 Cancer Staging   Staging form: Gallbladder, AJCC 8th Edition - Clinical: Stage IVB (cTX, cNX, pM1) - Signed by Autumn Millman, MD on 12/24/2023 Histologic grade (G): G2 Histologic grading system: 3 grade system   01/01/2024 -  Chemotherapy   Patient is on Treatment Plan : BILIARY TRACT Cisplatin  + Gemcitabine  D1,8 + Durvalumab  (1500) D1 q21d / Durvalumab  (1500) q28d         REVIEW OF SYSTEMS:   Review of Systems - Oncology  All other pertinent  systems were reviewed with the patient and are negative.  ALLERGIES: She is allergic to sulfa antibiotics, forteo  [parathyroid hormone (recomb)], atorvastatin, and metformin hcl.  MEDICATIONS:  Current Outpatient Medications  Medication Sig Dispense Refill   albuterol  (VENTOLIN  HFA) 108 (90 Base) MCG/ACT inhaler Inhale 1-2 puffs into the lungs every 6 (six) hours as needed for wheezing or shortness of breath. 8 g 1   aspirin  81 MG EC tablet Take 81 mg by mouth daily.     budesonide  (PULMICORT ) 0.5 MG/2ML nebulizer solution Generic for Pulmicort . Inhale one vial in nebulizer twice a day. Rinse mouth after use. 60 mL 11   chlordiazePOXIDE  (LIBRIUM ) 10 MG capsule Take 1 capsule (10 mg total) by mouth as needed for anxiety (max of twice daily). 60 capsule 0   ciprofloxacin  (CIPRO ) 500 MG tablet Take 1 tablet (500 mg total) by mouth 2 (two) times daily. 14 tablet 0   dexamethasone  (DECADRON ) 4 MG tablet Take 2 tablets (8 mg) by mouth daily x 3 days starting the day after cisplatin  chemotherapy. Take with food. 30 tablet 1   docusate sodium  (COLACE) 100 MG capsule Take 500 mg by mouth at bedtime.     formoterol  (PERFOROMIST ) 20 MCG/2ML nebulizer solution Substituted for: Perforomist  Solution Inhale one vial in nebulizer twice a day. 60 mL 11   lidocaine -prilocaine  (EMLA ) cream Apply to affected area once 30 g 3   metoprolol  succinate (TOPROL -XL) 50 MG 24 hr tablet TAKE 1 TABLET BY MOUTH ONCE DAILY. TAKE WITH OR IMMEDIATELY FOLLOWING A MEAL 90 tablet 1  morphine  (MS CONTIN ) 15 MG 12 hr tablet Take 1 tablet (15 mg total) by mouth every 12 (twelve) hours. 60 tablet 0   omeprazole  (PRILOSEC) 20 MG capsule Take 1 capsule (20 mg total) by mouth 2 (two) times daily before a meal. 60 capsule 3   ondansetron  (ZOFRAN ) 4 MG tablet Take 1 tablet (4 mg total) by mouth every 6 (six) hours as needed for nausea. 30 tablet 0   ondansetron  (ZOFRAN ) 8 MG tablet Take 1 tablet (8 mg total) by mouth every 8 (eight) hours  as needed for nausea or vomiting. Start on the third day after cisplatin . 30 tablet 1   oxyCODONE  10 MG TABS Take 1-1.5 tablets (10-15 mg total) by mouth every 4 (four) hours as needed for moderate pain (pain score 4-6) or severe pain (pain score 7-10). 60 tablet 0   polyethylene glycol (MIRALAX  / GLYCOLAX ) 17 g packet Take 17 g by mouth 2 (two) times daily.     prochlorperazine  (COMPAZINE ) 10 MG tablet Take 1 tablet (10 mg total) by mouth every 6 (six) hours as needed (Nausea or vomiting). 30 tablet 1   revefenacin  (YUPELRI ) 175 MCG/3ML nebulizer solution Take 3 mLs (175 mcg total) by nebulization daily.     rosuvastatin  (CRESTOR ) 10 MG tablet Take 1 tablet (10 mg total) by mouth daily. 90 tablet 1   No current facility-administered medications for this visit.   Facility-Administered Medications Ordered in Other Visits  Medication Dose Route Frequency Provider Last Rate Last Admin   0.9 %  sodium chloride  infusion   Intravenous Continuous Allred, Darrell K, PA-C 40 mL/hr at 01/07/24 1250 New Bag at 01/07/24 1250     VITALS:   Blood pressure (!) 110/51, pulse 60, temperature (!) 97.3 F (36.3 C), temperature source Temporal, resp. rate 19, weight 164 lb 14.4 oz (74.8 kg), SpO2 100%.  Wt Readings from Last 3 Encounters:  01/07/24 164 lb 14.4 oz (74.8 kg)  01/07/24 164 lb 14.4 oz (74.8 kg)  12/31/23 166 lb 3.2 oz (75.4 kg)    Body mass index is 30.16 kg/m.    Onc Performance Status - 01/07/24 0902       ECOG Perf Status   ECOG Perf Status Restricted in physically strenuous activity but ambulatory and able to carry out work of a light or sedentary nature, e.g., light house work, office work      KPS SCALE   KPS % SCORE Normal activity with effort, some s/s of disease           PHYSICAL EXAM:   Physical Exam Constitutional:      General: She is not in acute distress.    Appearance: Normal appearance.  HENT:     Head: Normocephalic and atraumatic.   Cardiovascular:      Rate and Rhythm: Normal rate.  Pulmonary:     Effort: Pulmonary effort is normal. No respiratory distress.  Abdominal:     General: There is no distension.   Neurological:     General: No focal deficit present.     Mental Status: She is alert and oriented to person, place, and time.   Psychiatric:        Mood and Affect: Mood normal.        Behavior: Behavior normal.       LABORATORY DATA:   I have reviewed the data as listed.  Results for orders placed or performed in visit on 01/07/24  Magnesium   Result Value Ref Range   Magnesium   2.0 1.7 - 2.4 mg/dL  CMP ([facility] only)  Result Value Ref Range   Sodium 139 135 - 145 mmol/L   Potassium 3.8 3.5 - 5.1 mmol/L   Chloride 104 98 - 111 mmol/L   CO2 29 22 - 32 mmol/L   Glucose, Bld 116 (H) 70 - 99 mg/dL   BUN 15 8 - 23 mg/dL   Creatinine 9.14 9.55 - 1.00 mg/dL   Calcium  8.7 (L) 8.9 - 10.3 mg/dL   Total Protein 6.1 (L) 6.5 - 8.1 g/dL   Albumin  3.8 3.5 - 5.0 g/dL   AST 28 15 - 41 U/L   ALT 30 0 - 44 U/L   Alkaline Phosphatase 70 38 - 126 U/L   Total Bilirubin 0.9 0.0 - 1.2 mg/dL   GFR, Estimated >39 >39 mL/min   Anion gap 6 5 - 15  CBC with Differential ([facility] Only)  Result Value Ref Range   WBC Count 3.5 (L) 4.0 - 10.5 K/uL   RBC 4.19 3.87 - 5.11 MIL/uL   Hemoglobin 12.2 12.0 - 15.0 g/dL   HCT 64.2 (L) 63.9 - 53.9 %   MCV 85.2 80.0 - 100.0 fL   MCH 29.1 26.0 - 34.0 pg   MCHC 34.2 30.0 - 36.0 g/dL   RDW 87.1 88.4 - 84.4 %   Platelet Count 135 (L) 150 - 400 K/uL   nRBC 0.0 0.0 - 0.2 %   Neutrophils Relative % 59 %   Neutro Abs 2.2 1.7 - 7.7 K/uL   Lymphocytes Relative 35 %   Lymphs Abs 1.2 0.7 - 4.0 K/uL   Monocytes Relative 3 %   Monocytes Absolute 0.1 0.1 - 1.0 K/uL   Eosinophils Relative 1 %   Eosinophils Absolute 0.0 0.0 - 0.5 K/uL   Basophils Relative 1 %   Basophils Absolute 0.0 0.0 - 0.1 K/uL   Immature Granulocytes 1 %   Abs Immature Granulocytes 0.02 0.00 - 0.07 K/uL     RADIOGRAPHIC  STUDIES:  I have personally reviewed the radiological images as listed and agree with the findings in the report.  NM PET Image Initial (PI) Skull Base To Thigh Result Date: 12/29/2023 CLINICAL DATA:  Initial treatment strategy for gallbladder carcinoma. EXAM: NUCLEAR MEDICINE PET SKULL BASE TO THIGH TECHNIQUE: 9.05 mCi F-18 FDG was injected intravenously. Full-ring PET imaging was performed from the skull base to thigh after the radiotracer. CT data was obtained and used for attenuation correction and anatomic localization. Fasting blood glucose: 118 mg/dl COMPARISON:  MRI abdomen and CT abdomen pelvis 12/14/2023. Chest CT 12/23/2022. FINDINGS: Mediastinal blood pool activity: SUV max 2.8 Liver activity: SUV max 2.9 NECK: No specific abnormal uptake seen above blood pool in the neck including along lymph node change of the submandibular, posterior triangle or internal jugular region. Near symmetric uptake of the visualized intracranial compartment. Incidental CT findings: The parotid glands, submandibular glands and thyroid  gland are unremarkable scattered vascular calcifications in the neck. Mastoid air cells are clear. The visualized paranasal sinuses are clear. CHEST: There is no abnormal uptake seen above blood pool in the axillary regions, hilum or mediastinum. Slight uptake along the lower esophagus but there is a small hiatal hernia. Lungs demonstrate several scattered nodules bilaterally. Many of which are under 5 mm but there are some additional dominant foci seen. Example right lower lobe medially measuring 11 mm on series 6, image 70. This likely other larger foci are hypermetabolic. The specific lesion has maximum SUV of 4.6.  Another example medial left lower lobe on image 64 measuring 9 mm in AP dimension has uptake of maximum SUV of 2.4. Again low but the lesions are small. Right upper lobe focus has dimension on image 49 of 9 mm. This abuts the pleura anteriorly and has maximum SUV of 3.7. The  lesions are worrisome for lung metastases. Estimating up to 30 nodules are seen in each lung. Incidental CT findings: Some breathing motion. Dependent basilar atelectasis. No consolidation, pneumothorax or effusion. Heart is nonenlarged. Coronary artery calcifications are seen. The thoracic aorta has a normal course and caliber. There are some small mediastinal nodes identified, less than a cm in short axis and not pathologic by size criteria. These do not have abnormal uptake at this time. ABDOMEN/PELVIS: As seen on the prior examinations there is masslike appearance to the gallbladder in the adjacent portion of the liver in segment 4 with uptake. The uptake in segment 4 has maximum SUV of 6.8 consistent with neoplasm. A few small areas elsewhere in left hepatic lobe seen such as image 80 of the PET scan measuring maximum SUV value of 5.1. This area measures up to a cm in size. Several additional foci seen such as image 76, 77. There is also a surface nodule along the right hepatic lobe laterally which is hypermetabolic of maximum SUV value of 7.0. Slight thickening in this location on image 886 of the liver measuring 7 mm of the capsule. There also hypermetabolic nodules elsewhere including in the mesentery left upper quadrant which has a focus of maximum SUV value of 6.0 and on image 83 of the CT scan measures 11 by 9 mm. Additional area in the anterior pelvic mesentery on image 122 in the midline measures 3.3 x 1.5 cm and has uptake of 5.3 maximum SUV. Other areas as well. Pelvic lesions are identified which are hypermetabolic in the area of the adnexa. These could be additional areas of disease including Krunkenberg tumors. The larger focus on the left on image 127 of the CT scan measures 4.4 x 3.1 cm with maximum SUV of 17.1. The right-sided focus has maximum SUV of 13.3 and a smaller on image 129 measuring 17 by 15 mm. There are few retroperitoneal nodes which have some uptake including aortocaval, portacaval  and periaortic. Example left para-aortic node which has maximum SUV of 5.0, on image 96 measures 10 by 7 mm. Portacaval node on image 83 measures 2.5 x 1.3 cm and has maximum SUV of 7.9. Incidental CT findings: Bowel is nondilated. Scattered colonic stool. Few air-fluid levels along the colon. Spleen is nonenlarged. Bilateral adrenal nodular to are identified which are without abnormal uptake. These could be adenomas but are indeterminate on this examination. However these lesions have been present and stable since at least August 2022 demonstrating long-term stability and likely benign etiology. Scattered vascular calcifications identified along the aorta and branch vessels. Area of potential stenosis along the SMA. Mild mesenteric stranding. No frank ascites. No definite renal or ureteral stones. Underdistended urinary bladder. SKELETON: No abnormal uptake identified along the visualized osseous structures. Incidental CT findings: Significant streak artifact from the extensive hardware fixation throughout the lower thoracic and lumbar spine extending into the upper sacrum. Curvature and degenerative changes are noted along the spine. Degenerative changes elsewhere as well. Areas of canal encroachment seen suggested along the midthoracic spine such as at T5-6 level. Osteophytes seen extending towards the central canal. Laminectomy changes along the lower lumbar spine as well. IMPRESSION: Focal hypermetabolic mass  lesion involving the gallbladder extending into segment 4B of the liver. Additional small areas of uptake in the left hepatic lobe. Please correlate for the finding seen on recent MRI of the liver. Areas of peritoneal carcinomatosis with several mesenteric nodules and liver surface. There is also presence of pelvic lesions which are hypermetabolic, possible drop metastases,Krunkenberg like tumors. Numerous lung nodules identified. Many of which are quite small and have no rim minimal uptake. Some of the  larger lesions have more intense uptake in measure up to 11 mm. These are worrisome for multifocal lung metastases. Few hypermetabolic upper abdominal lymph nodes including portacaval and retroperitoneal consistent with spread of disease. Extensive spinal fixation hardware with degenerative changes and laminectomy. Areas of potential stenosis. Electronically Signed   By: Ranell Bring M.D.   On: 12/29/2023 15:25   CT ABDOMINAL MASS BIOPSY Result Date: 12/15/2023 INDICATION: Pelvic mass EXAM: CT-guided core needle biopsy pelvic mass TECHNIQUE: Multidetector CT imaging of the pelvis was performed following the standard protocol without IV contrast. RADIATION DOSE REDUCTION: This exam was performed according to the departmental dose-optimization program which includes automated exposure control, adjustment of the mA and/or kV according to patient size and/or use of iterative reconstruction technique. MEDICATIONS: None. ANESTHESIA/SEDATION: Moderate (conscious) sedation was employed during this procedure. A total of Versed  1 mg and Fentanyl  100 mcg was administered intravenously by the radiology nurse. Total intra-service moderate Sedation Time: 14 minutes. The patient's level of consciousness and vital signs were monitored continuously by radiology nursing throughout the procedure under my direct supervision. COMPLICATIONS: None immediate. PROCEDURE: Informed written consent was obtained from the patient after a thorough discussion of the procedural risks, benefits and alternatives. All questions were addressed. Maximal Sterile Barrier Technique was utilized including caps, mask, sterile gowns, sterile gloves, sterile drape, hand hygiene and skin antiseptic. A timeout was performed prior to the initiation of the procedure. The patient was placed on the CT gantry in a supine position. Radiopaque markers were placed on the patient's skin near the target lesion. The the patient's pelvis was then imaged, measurements were  obtained, and the patient's skin was marked for the best trajectory for percutaneous biopsy. Local anesthesia was achieved with 1% lidocaine  by infiltrative subcutaneous tissue to the level of the abdominal rectus with 1% lidocaine . A stab incision was then created and the biopsy trocar was advanced to the level of the rectus. Further imaging was obtained. The needle was then removed and the biopsy BioPince needle was advanced through the cannula and a biopsy sample was obtained. Further imaging demonstrated no significant hemorrhage. A second core needle biopsy was obtained, but minimal material returned. All needles were removed the patient. Sterile dressing was applied. IMPRESSION: Successful core needle biopsy of an omental mass in the midline in the patient's pelvis. Electronically Signed   By: Cordella Banner   On: 12/15/2023 12:17   MR ABDOMEN MRCP W WO CONTAST Result Date: 12/14/2023 CLINICAL DATA:  Gallbladder malignancy suspected, gallbladder mass, pulmonary metastases, and peritoneal metastatic disease identified by prior CT EXAM: MRI ABDOMEN WITHOUT AND WITH CONTRAST (INCLUDING MRCP) TECHNIQUE: Multiplanar multisequence MR imaging of the abdomen was performed both before and after the administration of intravenous contrast. Heavily T2-weighted images of the biliary and pancreatic ducts were obtained, and three-dimensional MRCP images were rendered by post processing. CONTRAST:  7mL GADAVIST  GADOBUTROL  1 MMOL/ML IV SOLN COMPARISON:  CT abdomen pelvis, 12/14/2023 FINDINGS: Lower chest: No acute abnormality. Numerous small bilateral pulmonary nodules, better assessed by CT (series 14,  image 5). Hepatobiliary: Thickened, contracted gallbladder with a heterogeneous mass arising from the gallbladder fundus, contiguous with the adjacent liver parenchyma of hepatic segment IVB measuring 3.5 x 2.8 cm (series 14, image 37). Multiple small rim enhancing lesions within the liver parenchyma, for example in  anterior hepatic segment II measuring 1.2 x 1.1 cm (series 14, image 19). Pancreas: Unremarkable. No pancreatic ductal dilatation or surrounding inflammatory changes. Spleen: Normal in size without significant abnormality. Adrenals/Urinary Tract: Unchanged, definitively benign macroscopic fat containing adrenal adenomata requiring no specific further follow-up or characterization. Kidneys are normal, without obvious renal calculi, solid lesion, or hydronephrosis. Stomach/Bowel: Stomach is within normal limits. No evidence of bowel wall thickening, distention, or inflammatory changes. Vascular/Lymphatic: Aortic atherosclerosis. Enlarged, necrotic appearing portacaval and porta hepatis lymph nodes measuring up 2 2.9 x 1.6 cm (series 14, image 31). Small, although rim enhancing and abnormal appearing retroperitoneal lymph nodes (series 14, image 50). Other: No abdominal wall hernia or abnormality. No ascites. Peritoneal stranding and nodularity, incompletely visualized on this examination although present in the left upper quadrant (series 14, image 29). Musculoskeletal: No acute or significant osseous findings. IMPRESSION: 1. Thickened, contracted gallbladder with a heterogeneous mass arising from the gallbladder fundus, contiguous with the adjacent liver parenchyma of hepatic segment IVB measuring 3.5 x 2.8 cm. 2. Multiple small rim enhancing lesions within the liver parenchyma. 3. Enlarged, necrotic appearing portacaval and porta hepatis lymph nodes. Small, although rim enhancing and abnormal appearing retroperitoneal lymph nodes. 4. Peritoneal stranding and nodularity, incompletely visualized on this examination although present in the left upper quadrant, consistent with peritoneal metastatic disease. 5. Numerous small bilateral pulmonary nodules, better assessed by CT. 6. 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 Aortic Atherosclerosis (ICD10-I70.0).  Electronically Signed   By: Marolyn JONETTA Jaksch M.D.   On: 12/14/2023 21:08   MR 3D Recon At Scanner Result Date: 12/14/2023 CLINICAL DATA:  Gallbladder malignancy suspected, gallbladder mass, pulmonary metastases, and peritoneal metastatic disease identified by prior CT EXAM: MRI ABDOMEN WITHOUT AND WITH CONTRAST (INCLUDING MRCP) TECHNIQUE: Multiplanar multisequence MR imaging of the abdomen was performed both before and after the administration of intravenous contrast. Heavily T2-weighted images of the biliary and pancreatic ducts were obtained, and three-dimensional MRCP images were rendered by post processing. CONTRAST:  7mL GADAVIST  GADOBUTROL  1 MMOL/ML IV SOLN COMPARISON:  CT abdomen pelvis, 12/14/2023 FINDINGS: Lower chest: No acute abnormality. Numerous small bilateral pulmonary nodules, better assessed by CT (series 14, image 5). Hepatobiliary: Thickened, contracted gallbladder with a heterogeneous mass arising from the gallbladder fundus, contiguous with the adjacent liver parenchyma of hepatic segment IVB measuring 3.5 x 2.8 cm (series 14, image 37). Multiple small rim enhancing lesions within the liver parenchyma, for example in anterior hepatic segment II measuring 1.2 x 1.1 cm (series 14, image 19). Pancreas: Unremarkable. No pancreatic ductal dilatation or surrounding inflammatory changes. Spleen: Normal in size without significant abnormality. Adrenals/Urinary Tract: Unchanged, definitively benign macroscopic fat containing adrenal adenomata requiring no specific further follow-up or characterization. Kidneys are normal, without obvious renal calculi, solid lesion, or hydronephrosis. Stomach/Bowel: Stomach is within normal limits. No evidence of bowel wall thickening, distention, or inflammatory changes. Vascular/Lymphatic: Aortic atherosclerosis. Enlarged, necrotic appearing portacaval and porta hepatis lymph nodes measuring up 2 2.9 x 1.6 cm (series 14, image 31). Small, although rim enhancing and  abnormal appearing retroperitoneal lymph nodes (series 14, image 50). Other: No abdominal wall hernia or abnormality. No ascites. Peritoneal stranding and nodularity, incompletely visualized on this examination  although present in the left upper quadrant (series 14, image 29). Musculoskeletal: No acute or significant osseous findings. IMPRESSION: 1. Thickened, contracted gallbladder with a heterogeneous mass arising from the gallbladder fundus, contiguous with the adjacent liver parenchyma of hepatic segment IVB measuring 3.5 x 2.8 cm. 2. Multiple small rim enhancing lesions within the liver parenchyma. 3. Enlarged, necrotic appearing portacaval and porta hepatis lymph nodes. Small, although rim enhancing and abnormal appearing retroperitoneal lymph nodes. 4. Peritoneal stranding and nodularity, incompletely visualized on this examination although present in the left upper quadrant, consistent with peritoneal metastatic disease. 5. Numerous small bilateral pulmonary nodules, better assessed by CT. 6. 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 Aortic Atherosclerosis (ICD10-I70.0). Electronically Signed   By: Marolyn JONETTA Jaksch M.D.   On: 12/14/2023 21:08   CT ABDOMEN PELVIS W CONTRAST Result Date: 12/14/2023 CLINICAL DATA:  Right upper quadrant abdominal pain EXAM: CT ABDOMEN AND PELVIS WITH CONTRAST TECHNIQUE: Multidetector CT imaging of the abdomen and pelvis was performed using the standard protocol following bolus administration of intravenous contrast. RADIATION DOSE REDUCTION: This exam was performed according to the departmental dose-optimization program which includes automated exposure control, adjustment of the mA and/or kV according to patient size and/or use of iterative reconstruction technique. CONTRAST:  OMNIPAQUE  IOHEXOL  300 MG/ML  SOLN COMPARISON:  Nov 19, 2022 FINDINGS: Lower chest: Several nodules in both lower lobes are identified,  measuring between 3 and 4 mm in the right lower lobe and 3 and 5 mm in the lingula, as well as multiple nodules in the left lower lobe posteromedial segment the largest 9 mm in diameter on image number 8. And in the right lower lobe image number 25 measuring 12 mm in diameter. These nodules were not identified on the prior CT from 2024 there are 4 suspicious for metastatic lung disease. Hepatobiliary: Liver appears normal in size no parenchymal lesions however, gallbladder appears irregular, along the fundus of the gallbladder with a suggestion of capsular thickening measuring 2.4 x 0.9 cm. In addition the gallbladder appears inseparable from the adjacent portion of the liver, the possibility of gallbladder carcinoma should be considered and excluded clinically. Correlation with recent ultrasound demonstrates poor visualization of the gallbladder. The findings are not typical for acute cholecystitis in the CT images. Correlation with MR and MRCP is recommended. Pancreas: Unremarkable. No pancreatic ductal dilatation or surrounding inflammatory changes. Spleen: Normal in size without focal abnormality. Adrenals/Urinary Tract: Adrenal glands are unremarkable. Kidneys are normal, without renal calculi, focal lesion, or hydronephrosis. Bladder is unremarkable. Stomach/Bowel: There is mesenteric nodular thickening along the mid lower abdominal cavity better seen on images 56-60 and may suggest peritoneal carcinomatosis. Soft tissue nodular masses noted in the left lower quadrant region of the pelvis on image number 65 measuring 3.6 x 2.7 cm this could correlate with an ovarian neoplastic lesion. Again not present on prior examination 2024 Reproductive: Prior hysterectomy Other: No abdominal wall hernia or abnormality. No abdominopelvic ascites. Musculoskeletal: Extensive postsurgical changes of the thoracolumbar spine with intervertebral disc replacements L2-L3 L4 and L5. IMPRESSION: *Multiple pulmonary nodules in the  lower lobes bilaterally suspicious for metastatic lung disease. *Irregular appearance of the gallbladder with a suggestion of capsular thickening along the fundus of the gallbladder. In addition the gallbladder appears inseparable from the adjacent portion of the liver, the possibility of gallbladder carcinoma should be considered and excluded clinically. Correlation with recent ultrasound demonstrates poor visualization of the gallbladder. The findings are not typical for acute  cholecystitis in the CT images. Correlation with MR and MRCP is recommended. *Mesenteric nodular thickening along the mid lower abdominal cavity may suggest peritoneal carcinomatosis. *Soft tissue nodular mass in the left lower quadrant region of the pelvis measuring 3.6 x 2.7 cm this could correlate with an ovarian neoplastic lesion. Again not present on prior examination 2024. *Extensive postsurgical changes of the thoracolumbar spine with intervertebral disc replacements L2-L3 L4 and L5. Electronically Signed   By: Franky Chard M.D.   On: 12/14/2023 15:18   US  Abdomen Limited RUQ (LIVER/GB) Result Date: 12/14/2023 CLINICAL DATA:  [id number] RUQ pain 151471 EXAM: ULTRASOUND ABDOMEN LIMITED RIGHT UPPER QUADRANT COMPARISON:  Nov 19, 2022 FINDINGS: Gallbladder: The gallbladder was not well visualized or evaluated. The sonographer reports right upper quadrant pain in this region during the exam. Common bile duct: Diameter: 4 mm Liver: Normal echogenicity. No focal lesion identified. No intrahepatic biliary ductal dilation. Portal vein is patent on color Doppler imaging with normal direction of blood flow towards the liver. Other: None. IMPRESSION: 1. The gallbladder was not well visualized or evaluated during the exam; however, the sonographer reports right upper quadrant pain in this region. A follow-up CT of the abdomen and pelvis with IV contrast could be considered for further characterization. 2. No intrahepatic or extrahepatic biliary  ductal dilation. Electronically Signed   By: Rogelia Myers M.D.   On: 12/14/2023 13:05    CODE STATUS:  Code Status History     Date Active Date Inactive Code Status Order ID Comments User Context   12/14/2023 1601 12/16/2023 2004 Limited: Do not attempt resuscitation (DNR) -DNR-LIMITED -Do Not Intubate/DNI  [id number]  Caleen Burgess BROCKS, MD ED   09/17/2022 1236 09/18/2022 1519 Full Code [id number]  Onetha Kuba, MD Inpatient   02/27/2021 1602 03/09/2021 2325 Full Code [id number]  Onetha Kuba, MD Inpatient   06/27/2020 1110 06/28/2020 0504 Full Code [id number]  Derrill Dawn, MD HOV    Questions for Most Recent Historical Code Status (Order [id number])     Question Answer   If pulseless and not breathing No CPR or chest compressions.   In Pre-Arrest Conditions (Patient Is Breathing and Has A Pulse) Do not intubate. Provide all appropriate non-invasive medical interventions. Avoid ICU transfer unless indicated or required.   Consent: Discussion documented in EHR or advanced directives reviewed                Advance Directive Documentation    Flowsheet Row Most Recent Value  Type of Advance Directive Healthcare Power of Attorney, Living will  Pre-existing out of facility DNR order (yellow form or pink MOST form) --  MOST Form in Place? --    No orders of the defined types were placed in this encounter.    Future Appointments  Date Time Provider Department [place]  01/07/2024  2:30 PM [facility] 1 [facility] [place]  01/08/2024  8:45 AM [facility] INFUSION [facility] None  01/08/2024 12:00 PM Ivonne Harlene RAMAN, RD [facility] None  01/11/2024  2:15 PM [facility] FLUSH [facility] None  01/12/2024  9:00 AM Rumalda Alan PENNER, RN [facility] None  01/20/2024  2:30 PM [facility] LAB [facility] None  01/20/2024  3:00 PM Arber Wiemers, MD [facility] None  01/21/2024  8:15 AM [facility] INFUSION [facility] None  01/27/2024 10:15 AM [facility] LAB [facility] None  01/27/2024 10:45 AM Brenten Janney, MD  [facility] None  01/28/2024  8:15 AM [facility] INFUSION [facility] None  02/01/2024 10:45 AM [facility] INFUSION [facility] None  03/04/2024  8:30 AM Dewald,  Dorn NOVAK, MD [facility] None      This document was completed utilizing speech recognition software. Grammatical errors, random word insertions, pronoun errors, and incomplete sentences are an occasional consequence of this system due to software limitations, ambient noise, and hardware issues. Any formal questions or concerns about the content, text or information contained within the body of this dictation should be directly addressed to the provider for clarification.

## 2024-01-07 NOTE — Assessment & Plan Note (Addendum)
 Please review oncology history for additional details and timeline of events.  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 Biopsy from the omentum confirmed adenocarcinoma, originating from the gallbladder.  Immunostains support diagnosis of gallbladder carcinoma.    Previously I discussed diagnosis, staging, prognosis, plan of care, treatment options.  Reviewed NCCN guidelines.   Given stage IV disease, all treatment options are palliative in nature and not curative in intent.  This was discussed with patient, her daughter and son-in-law who were accompanying.  They verbalized understanding.    Discussed the aggressive nature of stage IV gallbladder adenocarcinoma and overall poor prognosis.  Life expectancy with treatment is 1 to 1.5 years; without treatment, less than six months.   We submitted NGS panel testing on the specimen and also liquid biopsy for mutation analysis.  Staging PET scan on 12/29/2023 showed known gallbladder mass extending into the liver.  Peritoneal carcinomatosis noted.  Numerous lung nodules were identified, small bowel with FDG uptake, concerning for metastatic disease in the lungs.  Metastatic lymphadenopathy was noted in the abdomen and retroperitoneum.  This scan will serve as a good baseline for future compression.  Discussed results with the patient and family members previously.   Plan made to proceed with palliative systemic chemoimmunotherapy with gemcitabine , cisplatin  and durvalumab .  She started this from 01/01/2024.  Tolerated first dose well.  No major side effects.  Labs today reveal no dose-limiting toxicities.  Mild leukopenia noted.  Will proceed with cycle 1 day 8 of chemotherapy tomorrow and she will receive G-CSF support later in the week.  - Ensure hydration and nutrition are maintained during treatment.  RTC in 2 weeks for labs, follow-up and continuation of chemotherapy.

## 2024-01-07 NOTE — Progress Notes (Signed)
 1615 Ice bag given to use for comfort to upper right neck and upper right chest.

## 2024-01-08 ENCOUNTER — Other Ambulatory Visit (HOSPITAL_COMMUNITY)

## 2024-01-08 ENCOUNTER — Inpatient Hospital Stay

## 2024-01-08 ENCOUNTER — Inpatient Hospital Stay: Admitting: Dietician

## 2024-01-08 VITALS — BP 145/65 | HR 71 | Temp 97.8°F | Resp 16

## 2024-01-08 DIAGNOSIS — Z5112 Encounter for antineoplastic immunotherapy: Secondary | ICD-10-CM | POA: Diagnosis not present

## 2024-01-08 DIAGNOSIS — C23 Malignant neoplasm of gallbladder: Secondary | ICD-10-CM

## 2024-01-08 MED ORDER — SODIUM CHLORIDE 0.9 % IV SOLN
1000.0000 mg/m2 | Freq: Once | INTRAVENOUS | Status: AC
  Administered 2024-01-08: 1786 mg via INTRAVENOUS
  Filled 2024-01-08: qty 46.97

## 2024-01-08 MED ORDER — MAGNESIUM SULFATE 2 GM/50ML IV SOLN
2.0000 g | Freq: Once | INTRAVENOUS | Status: AC
  Administered 2024-01-08: 2 g via INTRAVENOUS
  Filled 2024-01-08: qty 50

## 2024-01-08 MED ORDER — DEXAMETHASONE SODIUM PHOSPHATE 10 MG/ML IJ SOLN
10.0000 mg | Freq: Once | INTRAMUSCULAR | Status: AC
  Administered 2024-01-08: 10 mg via INTRAVENOUS
  Filled 2024-01-08: qty 1

## 2024-01-08 MED ORDER — SODIUM CHLORIDE 0.9 % IV SOLN
25.0000 mg/m2 | Freq: Once | INTRAVENOUS | Status: AC
  Administered 2024-01-08: 50 mg via INTRAVENOUS
  Filled 2024-01-08: qty 50

## 2024-01-08 MED ORDER — PALONOSETRON HCL INJECTION 0.25 MG/5ML
0.2500 mg | Freq: Once | INTRAVENOUS | Status: AC
  Administered 2024-01-08: 0.25 mg via INTRAVENOUS
  Filled 2024-01-08: qty 5

## 2024-01-08 MED ORDER — POTASSIUM CHLORIDE IN NACL 20-0.9 MEQ/L-% IV SOLN
Freq: Once | INTRAVENOUS | Status: AC
  Filled 2024-01-08: qty 1000

## 2024-01-08 MED ORDER — SODIUM CHLORIDE 0.9 % IV SOLN: INTRAVENOUS | Status: DC

## 2024-01-08 MED ORDER — APREPITANT 130 MG/18ML IV EMUL
130.0000 mg | Freq: Once | INTRAVENOUS | Status: AC
  Administered 2024-01-08: 130 mg via INTRAVENOUS
  Filled 2024-01-08: qty 18

## 2024-01-08 MED ORDER — OXYCODONE HCL 5 MG PO TABS
5.0000 mg | ORAL_TABLET | Freq: Once | ORAL | Status: AC
  Administered 2024-01-08: 5 mg via ORAL
  Filled 2024-01-08: qty 1

## 2024-01-08 NOTE — Patient Instructions (Signed)
 [facility] - A DEPT OF [facility]  Discharge Instructions: Thank you for choosing [facility] to provide your oncology and hematology care.   If you have a lab appointment with the Cancer Center, please go directly to the Cancer Center and check in at the registration area.   Wear comfortable clothing and clothing appropriate for easy access to any Portacath or PICC line.   We strive to give you quality time with your provider. You may need to reschedule your appointment if you arrive late (15 or more minutes).  Arriving late affects you and other patients whose appointments are after yours.  Also, if you miss three or more appointments without notifying the office, you may be dismissed from the clinic at the provider's discretion.      For prescription refill requests, have your pharmacy contact our office and allow 72 hours for refills to be completed.    Today you received the following chemotherapy and/or immunotherapy agents cisplatin, gemzar      To help prevent nausea and vomiting after your treatment, we encourage you to take your nausea medication as directed.  BELOW ARE SYMPTOMS THAT SHOULD BE REPORTED IMMEDIATELY: *FEVER GREATER THAN 100.4 F (38 C) OR HIGHER *CHILLS OR SWEATING *NAUSEA AND VOMITING THAT IS NOT CONTROLLED WITH YOUR NAUSEA MEDICATION *UNUSUAL SHORTNESS OF BREATH *UNUSUAL BRUISING OR BLEEDING *URINARY PROBLEMS (pain or burning when urinating, or frequent urination) *BOWEL PROBLEMS (unusual diarrhea, constipation, pain near the anus) TENDERNESS IN MOUTH AND THROAT WITH OR WITHOUT PRESENCE OF ULCERS (sore throat, sores in mouth, or a toothache) UNUSUAL RASH, SWELLING OR PAIN  UNUSUAL VAGINAL DISCHARGE OR ITCHING   Items with * indicate a potential emergency and should be followed up as soon as possible or go to the Emergency Department if any problems should occur.  Please show the CHEMOTHERAPY ALERT CARD or  IMMUNOTHERAPY ALERT CARD at check-in to the Emergency Department and triage nurse.  Should you have questions after your visit or need to cancel or reschedule your appointment, please contact [facility] - A DEPT OF [facility]  Dept: [phone]  and follow the prompts.  Office hours are 8:00 a.m. to 4:30 p.m. Monday - Friday. Please note that voicemails left after 4:00 p.m. may not be returned until the following business day.  We are closed weekends and major holidays. You have access to a nurse at all times for urgent questions. Please call the main number to the clinic Dept: [phone] and follow the prompts.   For any non-urgent questions, you may also contact your provider using MyChart. We now offer e-Visits for anyone 64 and older to request care online for non-urgent symptoms. For details visit [email or web address].   Also download the MyChart app! Go to the app store, search "MyChart", open the app, select [facility], and log in with your MyChart username and password.

## 2024-01-08 NOTE — Progress Notes (Signed)
 Nutrition Follow-up:  Pt with stage IV bladder cancer mets to peritoneum and lungs. She is receiving palliative chemotherapy with cisplatin  and gemcitabine  (first 6/20). Patient is under the care of Dr. Autumn   Met with patient in infusion. Husband is present today. Patient reports tolerating first therapy well, other than some taste changes. Reports difficulties in finding foods that sound appealing. Foods that do sound good do not taste good after a few bites. Pt has increased water. Reports drinking less Pepsi. Pt has not tried [employer]. She has not incorporated protein drink. Does not feel she needs them at this time. Pt denies nausea, vomiting, diarrhea, constipation.   Medications: reviewed   Labs: glucose 116  Anthropometrics: Wt 164 lb 14.4 oz today slightly decreased from 166 lb 3.2 oz on 6/20 (pt has decreased intake of pepsi which was providing empty calories) - will continue to monitor   NUTRITION DIAGNOSIS: Food and nutrition related knowledge deficit improving    INTERVENTION:  Encourage small frequent meals/snacks - pt has handouts with ideas  Continue working to increase daily water intake Discussed strategies for taste changes  Continue bowel regimen per MD    MONITORING, EVALUATION, GOAL: wt trends, intake   NEXT VISIT: To be scheduled as needed

## 2024-01-11 ENCOUNTER — Inpatient Hospital Stay

## 2024-01-11 DIAGNOSIS — Z5112 Encounter for antineoplastic immunotherapy: Secondary | ICD-10-CM | POA: Diagnosis not present

## 2024-01-11 DIAGNOSIS — C23 Malignant neoplasm of gallbladder: Secondary | ICD-10-CM

## 2024-01-11 MED ORDER — PEGFILGRASTIM-CBQV 6 MG/0.6ML ~~LOC~~ SOSY
6.0000 mg | PREFILLED_SYRINGE | Freq: Once | SUBCUTANEOUS | Status: AC
  Administered 2024-01-11: 6 mg via SUBCUTANEOUS

## 2024-01-11 NOTE — Patient Instructions (Signed)

## 2024-01-12 ENCOUNTER — Other Ambulatory Visit (HOSPITAL_COMMUNITY): Payer: Self-pay

## 2024-01-12 ENCOUNTER — Other Ambulatory Visit: Payer: Self-pay | Admitting: Oncology

## 2024-01-12 ENCOUNTER — Other Ambulatory Visit: Payer: Self-pay

## 2024-01-12 DIAGNOSIS — B3731 Acute candidiasis of vulva and vagina: Secondary | ICD-10-CM

## 2024-01-12 MED ORDER — FLUCONAZOLE 150 MG PO TABS
150.0000 mg | ORAL_TABLET | Freq: Once | ORAL | 0 refills | Status: AC
  Filled 2024-01-12: qty 1, 1d supply, fill #0

## 2024-01-12 NOTE — Transitions of Care (Post Inpatient/ED Visit) (Signed)
 Transition of Care week 4  Visit Note  01/12/2024  Name: Janice Pace MRN: [id number]          DOB: [birth year]  Situation: Patient enrolled in [facility] 30-day program. Visit completed with patient by telephone.   Background: New diagnosis of cancer  Initial Transition Care Management Follow-up Telephone Call    Past Medical History:  Diagnosis Date   Arthritis    Asthma    Bronchitis    Cancer (HCC)    dermatosivros arcoma and protuverans   Carpal tunnel syndrome, left upper limb 11/06/2017   Chronic back pain    degenerative disc disease   Constipation    stool softener daily   Constipation    COPD (chronic obstructive pulmonary disease) (HCC)    GERD (gastroesophageal reflux disease)    takes Prilosec daily   GERD (gastroesophageal reflux disease)    Heart murmur    Heart valve disorder    Hemorrhoids    History of hiatal hernia    Ileus (HCC) 03/02/2021   Insomnia    d/t chantix    Joint pain    Nocturia    Ovarian failure 05/28/2022   Personal history of colonic adenoma 03/13/2003   03/13/2003 - 5 mm adenoma   Pre-diabetes    Prediabetes    White coat syndrome without diagnosis of hypertension     Assessment: Patient Reported Symptoms: Cognitive Cognitive Status: Able to follow simple commands, Alert and oriented to person, place, and time, Normal speech and language skills      Neurological Neurological Review of Symptoms: No symptoms reported    HEENT HEENT Symptoms Reported: No symptoms reported      Cardiovascular Cardiovascular Symptoms Reported: No symptoms reported    Respiratory Respiratory Symptoms Reported: No symptoms reported, Other: Other Respiratory Symptoms: denies wheezing or cough Respiratory Management Strategies: Medication therapy Respiratory Self-Management Outcome: 4 (good)  Endocrine Endocrine Symptoms Reported: No symptoms reported    Gastrointestinal Gastrointestinal Symptoms Reported: Other, Change in appetite Other  Gastrointestinal Symptoms: denies current abdominal pain.  Continues to have a Bm every day or every other day.  Takes prn meds if needed for a BM. Gastrointestinal Management Strategies: Activity, Exercise, Medication therapy Gastrointestinal Self-Management Outcome: 4 (good) Gastrointestinal Comment: Reports that she feels like she is self managing her bowels well.    Genitourinary Genitourinary Symptoms Reported: Other Other Genitourinary Symptoms: reports that she has finished her Cipro . Has come vaginal itching and thinks she has a yeast infection, Additional Genitourinary Details: using vagisil without relief. Genitourinary Comment: in basket message to PCP and oncologist to inquire about recommendations  Integumentary Integumentary Symptoms Reported: Itching Additional Integumentary Details: reports itching at site of portacath.    Musculoskeletal Musculoskelatal Symptoms Reviewed: Back pain Additional Musculoskeletal Details: back pain worse with chemo treatment to due sitting for long periods of time. Musculoskeletal Management Strategies: Medication therapy Musculoskeletal Comment: patient reportst that the cancer center has told her that she can walk during treatment and take her pain medications.      Psychosocial Psychosocial Symptoms Reported: No symptoms reported Other Psychosocial Conditions: Reports that she is in good spiritis. Continues to be tired toward the end of the day.  Patient reports that she is trying to stay active.         Today's Vitals   01/12/24 9063  PainSc: 3      Medications Reviewed Today     Reviewed by Rumalda Alan PENNER, RN (Registered Nurse) on 01/12/24 at [phone]  Med List  Status: <None>   Medication Order Taking? Sig Documenting Provider Last Dose Status Informant  albuterol  (VENTOLIN  HFA) 108 (90 Base) MCG/ACT inhaler [id number] Yes Inhale 1-2 puffs into the lungs every 6 (six) hours as needed for wheezing or shortness of breath. Malachy Comer GAILS, NP  Active Self  aspirin  81 MG EC tablet [id number] Yes Take 81 mg by mouth daily. [provider]  Active Self  budesonide  (PULMICORT ) 0.5 MG/2ML nebulizer solution [id number] Yes Generic for Pulmicort . Inhale one vial in nebulizer twice a day. Rinse mouth after use. Kara Dorn NOVAK, MD  Active Self  chlordiazePOXIDE  (LIBRIUM ) 10 MG capsule [id number] Yes Take 1 capsule (10 mg total) by mouth as needed for anxiety (max of twice daily). Laurence Locus, DO  Active   ciprofloxacin  (CIPRO ) 500 MG tablet [id number]  Take 1 tablet (500 mg total) by mouth 2 (two) times daily.  Patient not taking: Reported on 01/12/2024   Autumn Millman, MD  Active   dexamethasone  (DECADRON ) 4 MG tablet [id number] Yes Take 2 tablets (8 mg) by mouth daily x 3 days starting the day after cisplatin  chemotherapy. Take with food. Pasam, Millman, MD  Active   docusate sodium  (COLACE) 100 MG capsule [id number] Yes Take 500 mg by mouth at bedtime. [provider]  Active Self  formoterol  (PERFOROMIST ) 20 MCG/2ML nebulizer solution [id number] Yes Substituted for: Perforomist  Solution Inhale one vial in nebulizer twice a day. Kara Dorn NOVAK, MD  Active Self  lidocaine -prilocaine  (EMLA ) cream [id number] Yes Apply to affected area once Pasam, Avinash, MD  Active   metoprolol  succinate (TOPROL -XL) 50 MG 24 hr tablet [id number] Yes TAKE 1 TABLET BY MOUTH ONCE DAILY. TAKE WITH OR IMMEDIATELY FOLLOWING A MEAL Chandra Toribio POUR, MD  Active Self  morphine  (MS CONTIN ) 15 MG 12 hr tablet [id number] Yes Take 1 tablet (15 mg total) by mouth every 12 (twelve) hours. Autumn Millman, MD  Active   omeprazole  (PRILOSEC) 20 MG capsule [id number] Yes Take 1 capsule (20 mg total) by mouth 2 (two) times daily before a meal. Clapp, Kara F, PA-C  Active   ondansetron  (ZOFRAN ) 4 MG tablet [id number] Yes Take 1 tablet (4 mg total) by mouth every 6 (six) hours as needed for nausea. Laurence Locus, DO  Active            Med Note (GAINES, JANEL R   Thu Jan 07, 2024 12:41 PM) Has medication, Has never taken  ondansetron  (ZOFRAN ) 8 MG tablet [id number] Yes Take 1 tablet (8 mg total) by mouth every 8 (eight) hours as needed for nausea or vomiting. Start on the third day after cisplatin . Pasam, Millman, MD  Active   oxyCODONE  10 MG TABS [id number] Yes Take 1-1.5 tablets (10-15 mg total) by mouth every 4 (four) hours as needed for moderate pain (pain score 4-6) or severe pain (pain score 7-10). Laurence Locus, DO  Active   polyethylene glycol (MIRALAX  / GLYCOLAX ) 17 g packet [id number] Yes Take 17 g by mouth 2 (two) times daily. [provider]  Active   prochlorperazine  (COMPAZINE ) 10 MG tablet [id number] Yes Take 1 tablet (10 mg total) by mouth every 6 (six) hours as needed (Nausea or vomiting). Pasam, Millman, MD  Active   revefenacin  (YUPELRI ) 175 MCG/3ML nebulizer solution [id number] Yes Take 3 mLs (175 mcg total) by nebulization daily. Kara Dorn NOVAK, MD  Active Self  rosuvastatin  (CRESTOR ) 10 MG tablet [id number] Yes Take 1 tablet (10 mg total) by mouth daily. Wallace Joesph LABOR, PA  Active Self            Recommendation:   Continue Current Plan of Care  Follow Up Plan:   Telephone follow up appointment date/time:  01/19/2024  Alan Ee, RN, BSN, CEN [facility]- Transition of Care Team.  [facility] [phone]

## 2024-01-12 NOTE — Patient Instructions (Signed)
 Visit Information  Thank you for taking time to visit with me today. Please don't hesitate to contact me if I can be of assistance to you before our next scheduled telephone appointment.  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  Patient with a positive attitude. Taking medications as prescribed and reports improved appetite. 12/31/2023  Reports some pain during the night and took tylenol . Recent loose stools and stopped Miralax .  Reports decrease in appetite. 01/06/2024  Patient reports that her first CHEMO went well.  Reports that she did develop some blisters on her hands but those have now decreased.  Reports doing well but is tired at the end of the day. Reports pain is managed and under good control. 01/12/2024  Patient reports that portacath went well and so did her 2nd infusion. Reports that she has increased back from sitting so long during infusion.  Reports she now knows that she can get up and walk during infusion and she plans to try this next time.   Reports portacath site is itchy but no pain.  States that she continues to have a decrease in appetite.  She has tired protein supplements and drinks them occasionally. Reports pain is under good control.     Goal:  Over the next 30 days, the patient will not experience hospital readmission  Interventions:  Transitions of Care: Doctor Visits  - discussed the importance of doctor visits, reviewed all pending appointments with patient on the phone. Reviewed all pending appointments in Mr Chart and patient is aware and has transportation.  Reviewed importance of eating healthy,  Discuss nutritional supplements as a benefit for her with added protein.  Reviewed calling cancer center with any problems or concerns  COPD Interventions:  Encouraged patient to continue to use her maintenance medications as prescribed.  Oncology: Confirmed all appointments and her understanding of reasons for appointments.  Reviewed pain control. Encouraged patient to continue to take her pain medications.  Discussed that patient will take her pain pills with her to her infusions moving forward.  Reviewed importance of hydration.  Reviewed water intake and patient continues to drink as much water as she can.  Encouraged patient to eat well.  Reviewed with patient protein options and encouraged smalls meals throughout the day.  Continue to take medications as prescribed. Message sent to PCP and oncology to report vaginal itching and she thinks she has a yeast infection.  PCP responded and will send in RX for Diffulcan.  Patient informed .   Patient Self Care Activities:  Attend all scheduled provider appointments Call pharmacy for medication refills 3-7 days in advance of running out of medications Call provider office for new concerns or questions  Notify RN Care Manager of TOC call rescheduling needs Participate in Transition of Care Program/Attend TOC scheduled calls Take medications as prescribed   eat healthy and drink plenty of fluids Be as active as you can.  Rest when your body tells you too.    Plan:  Telephone follow up appointment with care management team member scheduled for:  01/19/2024 appointment  scheduled with primary TOC RN, Alan Ee  The patient has been provided with contact information for the care management team and has been advised to call with any health related questions or concerns.          Our next appointment is by telephone on 01/19/2024   Please call the care guide team at [phone] if you need to cancel or reschedule your appointment.    If you are experiencing a Mental Health or Behavioral Health Crisis or need someone to talk to, please call the Suicide and Crisis Lifeline: 988 call the [place]  National Suicide Prevention Lifeline: 870-604-8518 or TTY: 204-054-7659 TTY (619)815-9826) to talk to a trained counselor call 1-800-273-TALK (toll free, 24 hour hotline) call 911   Patient verbalizes understanding of instructions and care plan provided today and agrees to view in MyChart. Active MyChart status and patient understanding of how to access instructions and care plan via MyChart confirmed with patient.     Alan Ee, RN, BSN, CEN [employer]- Transition of Care Team.  [facility] [phone]

## 2024-01-13 ENCOUNTER — Encounter (HOSPITAL_COMMUNITY): Payer: Self-pay | Admitting: Oncology

## 2024-01-19 ENCOUNTER — Other Ambulatory Visit: Payer: Self-pay

## 2024-01-19 NOTE — Transitions of Care (Post Inpatient/ED Visit) (Signed)
 Transition of Care Week 5  Visit Note  01/19/2024  Name: Janice Pace MRN: [id number]          DOB: Apr 23, 1946  Situation: Patient enrolled in [facility] 30-day program. Visit completed with pt by telephone.   Background: Recent admission for new onset cancer  Initial Transition Care Management Follow-up Telephone Call    Past Medical History:  Diagnosis Date   Arthritis    Asthma    Bronchitis    Cancer (HCC)    dermatosivros arcoma and protuverans   Carpal tunnel syndrome, left upper limb 11/06/2017   Chronic back pain    degenerative disc disease   Constipation    stool softener daily   Constipation    COPD (chronic obstructive pulmonary disease) (HCC)    GERD (gastroesophageal reflux disease)    takes Prilosec daily   GERD (gastroesophageal reflux disease)    Heart murmur    Heart valve disorder    Hemorrhoids    History of hiatal hernia    Ileus (HCC) 03/02/2021   Insomnia    d/t chantix    Joint pain    Nocturia    Ovarian failure 05/28/2022   Personal history of colonic adenoma 03/13/2003   03/13/2003 - 5 mm adenoma   Pre-diabetes    Prediabetes    White coat syndrome without diagnosis of hypertension     Assessment: Patient reports no pain. Reports she is doing well.  Patient has completed TOC program without a readmission.  Denies wanting to be transferred to [facility] nurse.  Patient Reported Symptoms: Cognitive Cognitive Status: Able to follow simple commands, Alert and oriented to person, place, and time, Normal speech and language skills      Neurological Neurological Review of Symptoms: No symptoms reported    HEENT HEENT Symptoms Reported: No symptoms reported      Cardiovascular Cardiovascular Symptoms Reported: No symptoms reported    Respiratory Respiratory Symptoms Reported: No symptoms reported    Endocrine Endocrine Symptoms Reported: No symptoms reported    Gastrointestinal Gastrointestinal Symptoms Reported: Change in appetite Other  Gastrointestinal Symptoms: Continues to have regular BM's ( with use of Miralax ) every other day.  Reports that if she misses a meals she will drink a protein supplement.  Denies weight loss .  Reports that she feels good without any pain. States that she is only taking her morphine  once a day.  States that when she gets ready to eat she will eat a few bites and then feel full. Gastrointestinal Management Strategies: Medication therapy Gastrointestinal Self-Management Outcome: 4 (good)    Genitourinary Genitourinary Symptoms Reported: Other Other Genitourinary Symptoms: Reports no urinary symtoms at this time. States that diffulcan took care of the vaginal itching.  No other symtoms to report.    Integumentary Integumentary Symptoms Reported: Other Other Integumentary Symptoms: states portacath site remains itchy. denies any signs of infection. Skin Self-Management Outcome: 4 (good)  Musculoskeletal Musculoskelatal Symptoms Reviewed: No symptoms reported        Psychosocial Psychosocial Symptoms Reported: No symptoms reported, Other Other Psychosocial Conditions: remains in good spirits.         Today's Vitals   01/19/24 1220  PainSc: 0-No pain     Medications Reviewed Today     Reviewed by Rumalda Alan PENNER, RN (Registered Nurse) on 01/19/24 at 1202  Med List Status: <None>   Medication Order Taking? Sig Documenting Provider Last Dose Status Informant  albuterol  (VENTOLIN  HFA) 108 (90 Base) MCG/ACT inhaler [id number] Yes Inhale 1-2  puffs into the lungs every 6 (six) hours as needed for wheezing or shortness of breath. Malachy Comer GAILS, NP  Active Self  aspirin  81 MG EC tablet [id number] Yes Take 81 mg by mouth daily. [provider]  Active Self  budesonide  (PULMICORT ) 0.5 MG/2ML nebulizer solution [id number] Yes Generic for Pulmicort . Inhale one vial in nebulizer twice a day. Rinse mouth after use. Kara Dorn NOVAK, MD  Active Self  ciprofloxacin  (CIPRO ) 500 MG tablet  [id number]  Take 1 tablet (500 mg total) by mouth 2 (two) times daily.  Patient not taking: Reported on 01/19/2024   Autumn Millman, MD  Active   dexamethasone  (DECADRON ) 4 MG tablet [id number] Yes Take 2 tablets (8 mg) by mouth daily x 3 days starting the day after cisplatin  chemotherapy. Take with food. Pasam, Millman, MD  Active   docusate sodium  (COLACE) 100 MG capsule [id number]  Take 500 mg by mouth at bedtime. [provider]  Active Self  formoterol  (PERFOROMIST ) 20 MCG/2ML nebulizer solution [id number] Yes Substituted for: Perforomist  Solution Inhale one vial in nebulizer twice a day. Kara Dorn NOVAK, MD  Active Self  lidocaine -prilocaine  (EMLA ) cream [id number] Yes Apply to affected area once Pasam, Avinash, MD  Active   metoprolol  succinate (TOPROL -XL) 50 MG 24 hr tablet [id number] Yes TAKE 1 TABLET BY MOUTH ONCE DAILY. TAKE WITH OR IMMEDIATELY FOLLOWING A MEAL Chandra Toribio POUR, MD  Active Self  morphine  (MS CONTIN ) 15 MG 12 hr tablet [id number] Yes Take 1 tablet (15 mg total) by mouth every 12 (twelve) hours.  Patient taking differently: Take 1 tablet (15 mg total) by mouth every 12 (twelve) hours.   Autumn Millman, MD  Active   omeprazole  (PRILOSEC) 20 MG capsule [id number] Yes Take 1 capsule (20 mg total) by mouth 2 (two) times daily before a meal. Clapp, Kara F, PA-C  Active   ondansetron  (ZOFRAN ) 4 MG tablet [id number] Yes Take 1 tablet (4 mg total) by mouth every 6 (six) hours as needed for nausea. Laurence Locus, DO  Active            Med Note (GAINES, JANEL R   Thu Jan 07, 2024 12:41 PM) Has medication, Has never taken  ondansetron  (ZOFRAN ) 8 MG tablet [id number] Yes Take 1 tablet (8 mg total) by mouth every 8 (eight) hours as needed for nausea or vomiting. Start on the third day after cisplatin . Pasam, Millman, MD  Active   oxyCODONE  10 MG TABS [id number]  Take 1-1.5 tablets (10-15 mg total) by mouth every 4 (four) hours as needed for moderate pain (pain score 4-6) or severe pain (pain  score 7-10).  Patient not taking: Reported on 01/19/2024   Laurence Locus, DO  Active   polyethylene glycol (MIRALAX  / GLYCOLAX ) 17 g packet [id number] Yes Take 17 g by mouth 2 (two) times daily. [provider]  Active   prochlorperazine  (COMPAZINE ) 10 MG tablet [id number] Yes Take 1 tablet (10 mg total) by mouth every 6 (six) hours as needed (Nausea or vomiting). Pasam, Millman, MD  Active   revefenacin  (YUPELRI ) 175 MCG/3ML nebulizer solution [id number] Yes Take 3 mLs (175 mcg total) by nebulization daily. Kara Dorn NOVAK, MD  Active Self  rosuvastatin  (CRESTOR ) 10 MG tablet [id number] Yes Take 1 tablet (10 mg total) by mouth daily. Wallace Joesph LABOR, PA  Active Self            Recommendation:   Continue Current Plan of Care  Follow Up Plan:   Closing From:  Transitions of Care Program  Alan Ee, RN, BSN, [employer] Population Health- Transition of Care Team.  [facility] [phone]

## 2024-01-19 NOTE — Patient Instructions (Signed)
 Visit Information  Thank you for taking time to visit with me today.  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  Patient with a positive attitude. Taking medications as prescribed and reports improved appetite. 12/31/2023  Reports some pain during the night and took tylenol . Recent loose stools and stopped Miralax .  Reports decrease in appetite. 01/06/2024  Patient reports that her first CHEMO went well.  Reports that she did develop some blisters on her hands but those have now decreased.  Reports doing well but is tired at the end of the day. Reports pain is managed and under good control. 01/12/2024  Patient reports that portacath went well and so did her 2nd infusion. Reports that she has increased back from sitting so long during infusion.  Reports she now knows that she can get up and walk during infusion and she plans to try this next time.   Reports portacath site is itchy but no pain.  States that she continues to have a decrease in appetite.  She has tired protein supplements and drinks them occasionally. Reports pain is under good control.     Goal:  Over the next 30 days, the patient will not experience hospital readmission  Interventions:  Transitions of Care: Doctor Visits  - discussed the importance of doctor visits, reviewed all pending appointments with patient on the phone. Reviewed all pending appointments in Mr Chart and patient is aware and has transportation.  Reviewed importance of eating healthy, Discuss nutritional supplements as a benefit for her with added protein.  Reviewed calling cancer center with any problems or  concerns Encouraged patient to rest when needed.  Reviewed with patient that she is only taking morphine  once a day.   COPD Interventions:  Encouraged patient to continue to use her maintenance medications as prescribed.  Oncology: Confirmed all appointments and her understanding of reasons for appointments.  Reviewed pain control. Encouraged patient to continue to take her pain medications.  Discussed that patient will take her pain pills with her to her infusions moving forward.  Reviewed importance of hydration.  Reviewed water intake and patient continues to drink as much water as she can.  Encouraged patient to eat well.  Reviewed with patient protein options and encouraged smalls meals throughout the day.  Continue to take medications as prescribed. Confirmed with patient no more signs of yeast.   Patient Self Care Activities:  Attend all scheduled provider appointments Call pharmacy for medication refills 3-7 days in advance of running out of medications Call provider office for new concerns or questions  Take medications as prescribed   eat healthy and drink plenty of fluids Be as active as you can.  Rest when your body tells you too.    Plan:  Patient has met her goals with no readmission for 30 days.  Offered to transfer to [facility] and patient declines.  Encouraged patient to call MD for changes in condition          If you are experiencing a Mental Health or Behavioral Health Crisis or need someone to talk  to, please call the Suicide and Crisis Lifeline: 988 call the [place]  National Suicide Prevention Lifeline: 304-886-9147 or TTY: 828-453-2196 TTY 7790860497) to talk to a trained counselor call 1-800-273-TALK (toll free, 24 hour hotline) call 911   Patient verbalizes understanding of instructions and care plan provided today and agrees to view in MyChart. Active MyChart status and patient understanding of how to access instructions and care plan via MyChart confirmed  with patient.     Alan Ee, RN, BSN, CEN [employer]- Transition of Care Team.  [facility] [phone]

## 2024-01-20 ENCOUNTER — Inpatient Hospital Stay (HOSPITAL_BASED_OUTPATIENT_CLINIC_OR_DEPARTMENT_OTHER): Admitting: Oncology

## 2024-01-20 ENCOUNTER — Other Ambulatory Visit: Payer: Self-pay

## 2024-01-20 ENCOUNTER — Encounter: Payer: Self-pay | Admitting: Oncology

## 2024-01-20 ENCOUNTER — Inpatient Hospital Stay: Attending: Oncology

## 2024-01-20 VITALS — BP 114/59 | HR 65 | Temp 97.7°F | Resp 17 | Ht 62.0 in | Wt 161.0 lb

## 2024-01-20 DIAGNOSIS — D72829 Elevated white blood cell count, unspecified: Secondary | ICD-10-CM | POA: Insufficient documentation

## 2024-01-20 DIAGNOSIS — D649 Anemia, unspecified: Secondary | ICD-10-CM | POA: Diagnosis not present

## 2024-01-20 DIAGNOSIS — F419 Anxiety disorder, unspecified: Secondary | ICD-10-CM | POA: Diagnosis not present

## 2024-01-20 DIAGNOSIS — C23 Malignant neoplasm of gallbladder: Secondary | ICD-10-CM | POA: Insufficient documentation

## 2024-01-20 DIAGNOSIS — C779 Secondary and unspecified malignant neoplasm of lymph node, unspecified: Secondary | ICD-10-CM | POA: Diagnosis not present

## 2024-01-20 DIAGNOSIS — Z882 Allergy status to sulfonamides status: Secondary | ICD-10-CM | POA: Diagnosis not present

## 2024-01-20 DIAGNOSIS — Z7951 Long term (current) use of inhaled steroids: Secondary | ICD-10-CM | POA: Diagnosis not present

## 2024-01-20 DIAGNOSIS — J4489 Other specified chronic obstructive pulmonary disease: Secondary | ICD-10-CM | POA: Diagnosis not present

## 2024-01-20 DIAGNOSIS — K828 Other specified diseases of gallbladder: Secondary | ICD-10-CM | POA: Insufficient documentation

## 2024-01-20 DIAGNOSIS — G893 Neoplasm related pain (acute) (chronic): Secondary | ICD-10-CM | POA: Insufficient documentation

## 2024-01-20 DIAGNOSIS — C7801 Secondary malignant neoplasm of right lung: Secondary | ICD-10-CM | POA: Diagnosis not present

## 2024-01-20 DIAGNOSIS — C786 Secondary malignant neoplasm of retroperitoneum and peritoneum: Secondary | ICD-10-CM | POA: Diagnosis not present

## 2024-01-20 DIAGNOSIS — Z5189 Encounter for other specified aftercare: Secondary | ICD-10-CM | POA: Diagnosis not present

## 2024-01-20 DIAGNOSIS — C7802 Secondary malignant neoplasm of left lung: Secondary | ICD-10-CM | POA: Diagnosis not present

## 2024-01-20 DIAGNOSIS — M1990 Unspecified osteoarthritis, unspecified site: Secondary | ICD-10-CM | POA: Diagnosis not present

## 2024-01-20 DIAGNOSIS — T451X5A Adverse effect of antineoplastic and immunosuppressive drugs, initial encounter: Secondary | ICD-10-CM | POA: Insufficient documentation

## 2024-01-20 DIAGNOSIS — Z7952 Long term (current) use of systemic steroids: Secondary | ICD-10-CM | POA: Insufficient documentation

## 2024-01-20 DIAGNOSIS — Z5111 Encounter for antineoplastic chemotherapy: Secondary | ICD-10-CM | POA: Insufficient documentation

## 2024-01-20 DIAGNOSIS — Z7982 Long term (current) use of aspirin: Secondary | ICD-10-CM | POA: Insufficient documentation

## 2024-01-20 DIAGNOSIS — Z5112 Encounter for antineoplastic immunotherapy: Secondary | ICD-10-CM | POA: Diagnosis present

## 2024-01-20 DIAGNOSIS — I10 Essential (primary) hypertension: Secondary | ICD-10-CM | POA: Insufficient documentation

## 2024-01-20 DIAGNOSIS — Z66 Do not resuscitate: Secondary | ICD-10-CM | POA: Diagnosis not present

## 2024-01-20 DIAGNOSIS — Z79899 Other long term (current) drug therapy: Secondary | ICD-10-CM | POA: Diagnosis not present

## 2024-01-20 DIAGNOSIS — Z888 Allergy status to other drugs, medicaments and biological substances status: Secondary | ICD-10-CM | POA: Insufficient documentation

## 2024-01-20 LAB — CBC WITH DIFFERENTIAL (CANCER CENTER ONLY)
Abs Immature Granulocytes: 0.74 K/uL — ABNORMAL HIGH (ref 0.00–0.07)
Basophils Absolute: 0.1 K/uL (ref 0.0–0.1)
Basophils Relative: 0 %
Eosinophils Absolute: 0 K/uL (ref 0.0–0.5)
Eosinophils Relative: 0 %
HCT: 32.3 % — ABNORMAL LOW (ref 36.0–46.0)
Hemoglobin: 11 g/dL — ABNORMAL LOW (ref 12.0–15.0)
Immature Granulocytes: 5 %
Lymphocytes Relative: 12 %
Lymphs Abs: 1.9 K/uL (ref 0.7–4.0)
MCH: 29.4 pg (ref 26.0–34.0)
MCHC: 34.1 g/dL (ref 30.0–36.0)
MCV: 86.4 fL (ref 80.0–100.0)
Monocytes Absolute: 1.6 K/uL — ABNORMAL HIGH (ref 0.1–1.0)
Monocytes Relative: 10 %
Neutro Abs: 11.4 K/uL — ABNORMAL HIGH (ref 1.7–7.7)
Neutrophils Relative %: 73 %
Platelet Count: 230 K/uL (ref 150–400)
RBC: 3.74 MIL/uL — ABNORMAL LOW (ref 3.87–5.11)
RDW: 13.8 % (ref 11.5–15.5)
WBC Count: 15.7 K/uL — ABNORMAL HIGH (ref 4.0–10.5)
nRBC: 0.1 % (ref 0.0–0.2)

## 2024-01-20 LAB — CMP (CANCER CENTER ONLY)
ALT: 18 U/L (ref 0–44)
AST: 23 U/L (ref 15–41)
Albumin: 3.7 g/dL (ref 3.5–5.0)
Alkaline Phosphatase: 96 U/L (ref 38–126)
Anion gap: 5 (ref 5–15)
BUN: 14 mg/dL (ref 8–23)
CO2: 27 mmol/L (ref 22–32)
Calcium: 8.9 mg/dL (ref 8.9–10.3)
Chloride: 109 mmol/L (ref 98–111)
Creatinine: 0.77 mg/dL (ref 0.44–1.00)
GFR, Estimated: >60 mL/min (ref >60)
Glucose, Bld: 144 mg/dL — ABNORMAL HIGH (ref 70–99)
Potassium: 4.1 mmol/L (ref 3.5–5.1)
Sodium: 141 mmol/L (ref 135–145)
Total Bilirubin: 0.3 mg/dL (ref 0.0–1.2)
Total Protein: 6 g/dL — ABNORMAL LOW (ref 6.5–8.1)

## 2024-01-20 LAB — MAGNESIUM: Magnesium: 1.7 mg/dL (ref 1.7–2.4)

## 2024-01-20 NOTE — Assessment & Plan Note (Signed)
 She was previously started on long-acting morphine  (MS Contin ) 15 mg p.o. twice daily.  She has not needed this lately.  She was advised to take oxycodone  for pain as needed.

## 2024-01-20 NOTE — Assessment & Plan Note (Addendum)
 Please review oncology history for additional details and timeline of events.  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 Biopsy from the omentum confirmed adenocarcinoma, originating from the gallbladder.  Immunostains support diagnosis of gallbladder carcinoma.    Previously I discussed diagnosis, staging, prognosis, plan of care, treatment options.  Reviewed NCCN guidelines.   Given stage IV disease, all treatment options are palliative in nature and not curative in intent.  This was discussed with patient, her daughter and son-in-law who were accompanying.  They verbalized understanding.    Discussed the aggressive nature of stage IV gallbladder adenocarcinoma and overall poor prognosis.  Life expectancy with treatment is 1 to 1.5 years; without treatment, less than six months.   We submitted NGS panel testing on the specimen and also liquid biopsy for mutation analysis.  Staging PET scan on 12/29/2023 showed known gallbladder mass extending into the liver.  Peritoneal carcinomatosis noted.  Numerous lung nodules were identified, small bowel with FDG uptake, concerning for metastatic disease in the lungs.  Metastatic lymphadenopathy was noted in the abdomen and retroperitoneum.  This scan will serve as a good baseline for future compression.  Discussed results with the patient and family members previously.   Plan made to proceed with palliative systemic chemoimmunotherapy with gemcitabine , cisplatin  and durvalumab .  She started this from 01/01/2024.  Tolerating chemo well so far.  No major side effects.  Labs today reveal no dose-limiting toxicities.  Leukocytosis noted, likely from G-CSF.  Will proceed with cycle 2-day 1 tomorrow as scheduled.  If white count remains increased next week, we can skip G-CSF support with this cycle.  - Ensure hydration and nutrition are maintained during treatment.  RTC in 1 week for labs, follow-up and continuation of  chemotherapy.

## 2024-01-20 NOTE — Progress Notes (Signed)
 [facility]  ONCOLOGY CLINIC PROGRESS NOTE   Patient Care Team: [name] as PCP - General (Physician Assistant) Pcp, [name], DO (Family Medicine) [place], [facility] [name], RN as Oncology Nurse Navigator  PATIENT NAME: Janice Pace   MR#: [id number] DOB: [birth year]  Date of visit: 01/20/2024   ASSESSMENT & PLAN:   Janice Pace is a 78 y.o. 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   Adenocarcinoma of gallbladder [facility]) Please review oncology history for additional details and timeline of events.  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 Biopsy from the omentum confirmed adenocarcinoma, originating from the gallbladder.  Immunostains support diagnosis of gallbladder carcinoma.    Previously I discussed diagnosis, staging, prognosis, plan of care, treatment options.  Reviewed NCCN guidelines.   Given stage IV disease, all treatment options are palliative in nature and not curative in intent.  This was discussed with patient, her daughter and son-in-law who were accompanying.  They verbalized understanding.    Discussed the aggressive nature of stage IV gallbladder adenocarcinoma and overall poor prognosis.  Life expectancy with treatment is 1 to 1.5 years; without treatment, less than six months.   We submitted NGS panel testing on the specimen and also liquid biopsy for mutation analysis.  Staging PET scan on 12/29/2023 showed known gallbladder mass extending into the liver.  Peritoneal carcinomatosis noted.  Numerous lung nodules were identified, small bowel with FDG uptake, concerning for metastatic disease in the lungs.  Metastatic  lymphadenopathy was noted in the abdomen and retroperitoneum.  This scan will serve as a good baseline for future compression.  Discussed results with the patient and family members previously.   Plan made to proceed with palliative systemic chemoimmunotherapy with gemcitabine , cisplatin  and durvalumab .  She started this from 01/01/2024.  Tolerating chemo well so far.  No major side effects.  Labs today reveal no dose-limiting toxicities.  Leukocytosis noted, likely from G-CSF.  Will proceed with cycle 2-day 1 tomorrow as scheduled.  If white count remains increased next week, we can skip G-CSF support with this cycle.  - Ensure hydration and nutrition are maintained during treatment.  RTC in 1 week for labs, follow-up and continuation of chemotherapy.  Cancer associated pain She was previously started on long-acting morphine  (MS Contin ) 15 mg p.o. twice daily.  She has not needed this lately.  She was advised to take oxycodone  for pain as needed.  I reviewed lab results and outside records for this visit and discussed relevant results with the patient. Diagnosis, plan of care and treatment options were also discussed in detail with the patient. Opportunity provided to ask questions and answers provided to her apparent satisfaction. Provided instructions to call our clinic with any problems, questions or concerns prior to return visit. I recommended to continue follow-up with PCP and sub-specialists. She verbalized understanding and agreed with the plan.   NCCN guidelines have been consulted in the planning of this patient's care.  I spent a total of 30 minutes during this encounter with the patient including review of chart and various tests results, discussions about plan of care and coordination of care plan.   Chinita Patten, MD  01/20/2024 4:54 PM  [facility]  [facility] - [facility] [address] [place]  [address] Dept: [phone] Dept Fax: [phone]    CHIEF COMPLAINT/ REASON FOR VISIT:   Stage IV gallbladder adenocarcinoma with peritoneal mets and lung mets.  Current Treatment: Palliative systemic chemoimmunotherapy with cisplatin , gemcitabine , durvalumab  started from 01/01/2024.  INTERVAL HISTORY:    Discussed the use of AI scribe software for clinical note transcription with the patient, who gave verbal consent to proceed.  History of Present Illness Janice Pace is a 78 year old female who presents for follow-up regarding pain management and chemotherapy treatment.  She has stopped taking her morning dose of morphine  due to lack of pain and only takes the evening dose before bed. She is considering reducing the morphine  dosage further as she has not experienced pain recently. No nausea or vomiting. She has not been taking Tylenol  regularly due to uncertainty about mixing it with other medications.  Claritin  was prescribed to prevent bone pain associated with the Neulasta  booster shot given after chemotherapy. She experiences some back pain after the last booster shot.  She inquires about using Benadryl  for allergy symptoms like a runny nose, which she takes as needed. She experiences cold hands after chemotherapy, for which she uses gloves to keep warm.  She notes a lack of appetite but denies nausea or vomiting. She eats whatever she can manage.  Her nerve medication, Librium , is not listed in her current medications but she confirms that she takes it regularly.  Her daughter, who lives in [place] , plans to visit with her seven-month-old child. It is noted that while children are not allowed in the treatment facility, it is fine for them to be at home with her.     I have reviewed the past medical history, past surgical history, social history and family history with the patient and they are unchanged from previous note.  HISTORY OF PRESENT ILLNESS:   ONCOLOGY HISTORY:    78 y.o. lady with past medical history of essential hypertension, osteoarthritis, COPD/asthma, GERD, depression, anxiety, hiatal hernia, chronic back pain presented to the ED on 12/14/2023 with complaints of worsening epigastric/abdominal pain over 2-3 weeks.  Routine blood work was unremarkable in the ER.  However CT abdomen and pelvis showed irregular appearance of the gallbladder with suggestion of capsular thickening along the fundus of the gallbladder.  Gallbladder appeared inseparable from the adjacent portion of the liver.  Clinical picture was concerning for gallbladder carcinoma.  Mesenteric nodular thickening along the mid lower abdominal cavity indicating peritoneal carcinomatosis.  Soft tissue nodular mass in the left lower quadrant region of the pelvis measuring 3.6 x 2.7 cm, likely ovarian neoplastic lesion.  Multiple lung nodules in the lower lobes bilaterally, largest measuring 12 mm in the right lower lobe, concerning for pulmonary metastatic disease.   She was admitted to the hospital for further evaluation and management.  IR was consulted for peritoneal mass biopsy.  CA 19-9 was significant elevated at 14,368.  CEA was increased at 98.7, CA-125 was also increased at 91.3.   On 12/14/2023, MRI abdomen/MRCP showed thickened, contracted gallbladder with a heterogeneous mass arising from the gallbladder fundus, contiguous with the adjacent liver parenchyma of hepatic segment IVB measuring 3.5 x 2.8 cm. Multiple small rim enhancing lesions within the liver parenchyma. Enlarged, necrotic appearing portacaval and porta hepatis lymph nodes. Small, although rim enhancing and abnormal appearing retroperitoneal lymph nodes. Peritoneal stranding and nodularity, incompletely visualized on  this examination although present in the left upper quadrant, consistent with peritoneal metastatic disease. Numerous small bilateral pulmonary nodules, better assessed by CT. Constellation of findings is consistent with  gallbladder malignancy and associated metastatic disease.   On 12/15/2023, she underwent CT-guided biopsy of the omental lesion.  Pathology showed metastatic well to moderately differentiated adenocarcinoma. The tumor is positive for cytokeratin 7 and shows focal weak positivity for the GI marker CDX2.  The tumor is negative for the GI markers cytokeratin 20.  The tumor is also negative for the GU and GYN marker PAX8.  The tumor is negative for the pulmonary adeno marker TTF-1. This immunohistochemical pattern and histomorphology would be compatible with the clinical suspicion of a gallbladder primary.  The differential diagnosis would also include an upper GI or pancreaticobiliary primary.    Patient presented to our clinic on 12/24/2023 to establish care with us .  Request placed for staging PET scan.  NGS panel testing requested on the specimen and also liquid biopsy obtained.  Staging PET scan on 12/29/2023 showed known gallbladder mass extending into the liver.  Peritoneal carcinomatosis noted.  Numerous lung nodules were identified, small bowel with FDG uptake, concerning for metastatic disease in the lungs.  Metastatic lymphadenopathy was noted in the abdomen and retroperitoneum.  This scan will serve as a good baseline for future compression.    Plan for palliative systemic chemoimmunotherapy with cisplatin , gemcitabine , durvalumab . Scheduled to start C1D1 from 01/01/24.   Oncology History  Adenocarcinoma of gallbladder (HCC)  12/24/2023 Initial Diagnosis   Adenocarcinoma of gallbladder (HCC)   12/24/2023 Cancer Staging   Staging form: Gallbladder, AJCC 8th Edition - Clinical: Stage IVB (cTX, cNX, pM1) - Signed by Autumn Millman, MD on 12/24/2023 Histologic grade (G): G2 Histologic grading system: 3 grade system   01/01/2024 -  Chemotherapy   Patient is on Treatment Plan : BILIARY TRACT Cisplatin  + Gemcitabine  D1,8 + Durvalumab  (1500) D1 q21d / Durvalumab  (1500) q28d         REVIEW OF  SYSTEMS:   Review of Systems - Oncology  All other pertinent systems were reviewed with the patient and are negative.  ALLERGIES: She is allergic to sulfa antibiotics, forteo  [parathyroid hormone (recomb)], atorvastatin, and metformin hcl.  MEDICATIONS:  Current Outpatient Medications  Medication Sig Dispense Refill   albuterol  (VENTOLIN  HFA) 108 (90 Base) MCG/ACT inhaler Inhale 1-2 puffs into the lungs every 6 (six) hours as needed for wheezing or shortness of breath. 8 g 1   aspirin  81 MG EC tablet Take 81 mg by mouth daily.     budesonide  (PULMICORT ) 0.5 MG/2ML nebulizer solution Generic for Pulmicort . Inhale one vial in nebulizer twice a day. Rinse mouth after use. 60 mL 11   dexamethasone  (DECADRON ) 4 MG tablet Take 2 tablets (8 mg) by mouth daily x 3 days starting the day after cisplatin  chemotherapy. Take with food. 30 tablet 1   docusate sodium  (COLACE) 100 MG capsule Take 500 mg by mouth at bedtime.     formoterol  (PERFOROMIST ) 20 MCG/2ML nebulizer solution Substituted for: Perforomist  Solution Inhale one vial in nebulizer twice a day. 60 mL 11   metoprolol  succinate (TOPROL -XL) 50 MG 24 hr tablet TAKE 1 TABLET BY MOUTH ONCE DAILY. TAKE WITH OR IMMEDIATELY FOLLOWING A MEAL 90 tablet 1   morphine  (MS CONTIN ) 15 MG 12 hr tablet Take 1 tablet (15 mg total) by mouth every 12 (twelve) hours. (Patient taking differently: Take 15 mg by mouth every 12 (twelve) hours. Taking once daily)  60 tablet 0   omeprazole  (PRILOSEC) 20 MG capsule Take 1 capsule (20 mg total) by mouth 2 (two) times daily before a meal. 60 capsule 3   ondansetron  (ZOFRAN ) 4 MG tablet Take 1 tablet (4 mg total) by mouth every 6 (six) hours as needed for nausea. 30 tablet 0   ondansetron  (ZOFRAN ) 8 MG tablet Take 1 tablet (8 mg total) by mouth every 8 (eight) hours as needed for nausea or vomiting. Start on the third day after cisplatin . 30 tablet 1   oxyCODONE  10 MG TABS Take 1-1.5 tablets (10-15 mg total) by mouth every 4  (four) hours as needed for moderate pain (pain score 4-6) or severe pain (pain score 7-10). (Patient taking differently: Take 10-15 mg by mouth every 4 (four) hours as needed for moderate pain (pain score 4-6) or severe pain (pain score 7-10). Pt reports she takes this once in awhile) 60 tablet 0   polyethylene glycol (MIRALAX  / GLYCOLAX ) 17 g packet Take 17 g by mouth 2 (two) times daily.     prochlorperazine  (COMPAZINE ) 10 MG tablet Take 1 tablet (10 mg total) by mouth every 6 (six) hours as needed (Nausea or vomiting). 30 tablet 1   revefenacin  (YUPELRI ) 175 MCG/3ML nebulizer solution Take 3 mLs (175 mcg total) by nebulization daily.     rosuvastatin  (CRESTOR ) 10 MG tablet Take 1 tablet (10 mg total) by mouth daily. 90 tablet 1   lidocaine -prilocaine  (EMLA ) cream Apply to affected area once (Patient not taking: Reported on 01/20/2024) 30 g 3   No current facility-administered medications for this visit.     VITALS:   Blood pressure (!) 114/59, pulse 65, temperature 97.7 F (36.5 C), temperature source Temporal, resp. rate 17, height 5' 2 (1.575 m), weight 161 lb (73 kg), SpO2 98%.  Wt Readings from Last 3 Encounters:  01/20/24 161 lb (73 kg)  01/07/24 164 lb 14.4 oz (74.8 kg)  01/07/24 164 lb 14.4 oz (74.8 kg)    Body mass index is 29.45 kg/m.    Onc Performance Status - 01/20/24 1516       ECOG Perf Status   ECOG Perf Status Restricted in physically strenuous activity but ambulatory and able to carry out work of a light or sedentary nature, e.g., light house work, office work      KPS SCALE   KPS % SCORE Normal activity with effort, some s/s of disease           PHYSICAL EXAM:   Physical Exam Constitutional:      General: She is not in acute distress.    Appearance: Normal appearance.  HENT:     Head: Normocephalic and atraumatic.  Cardiovascular:     Rate and Rhythm: Normal rate.  Pulmonary:     Effort: Pulmonary effort is normal. No respiratory distress.   Abdominal:     General: There is no distension.  Neurological:     General: No focal deficit present.     Mental Status: She is alert and oriented to person, place, and time.  Psychiatric:        Mood and Affect: Mood normal.        Behavior: Behavior normal.       LABORATORY DATA:   I have reviewed the data as listed.  Results for orders placed or performed in visit on 01/20/24  Magnesium   Result Value Ref Range   Magnesium  1.7 1.7 - 2.4 mg/dL  CBC with Differential (Cancer Center Only)  Result  Value Ref Range   WBC Count 15.7 (H) 4.0 - 10.5 K/uL   RBC 3.74 (L) 3.87 - 5.11 MIL/uL   Hemoglobin 11.0 (L) 12.0 - 15.0 g/dL   HCT 67.6 (L) 63.9 - 53.9 %   MCV 86.4 80.0 - 100.0 fL   MCH 29.4 26.0 - 34.0 pg   MCHC 34.1 30.0 - 36.0 g/dL   RDW 86.1 88.4 - 84.4 %   Platelet Count 230 150 - 400 K/uL   nRBC 0.1 0.0 - 0.2 %   Neutrophils Relative % 73 %   Neutro Abs 11.4 (H) 1.7 - 7.7 K/uL   Lymphocytes Relative 12 %   Lymphs Abs 1.9 0.7 - 4.0 K/uL   Monocytes Relative 10 %   Monocytes Absolute 1.6 (H) 0.1 - 1.0 K/uL   Eosinophils Relative 0 %   Eosinophils Absolute 0.0 0.0 - 0.5 K/uL   Basophils Relative 0 %   Basophils Absolute 0.1 0.0 - 0.1 K/uL   Immature Granulocytes 5 %   Abs Immature Granulocytes 0.74 (H) 0.00 - 0.07 K/uL  CMP (Cancer Center only)  Result Value Ref Range   Sodium 141 135 - 145 mmol/L   Potassium 4.1 3.5 - 5.1 mmol/L   Chloride 109 98 - 111 mmol/L   CO2 27 22 - 32 mmol/L   Glucose, Bld 144 (H) 70 - 99 mg/dL   BUN 14 8 - 23 mg/dL   Creatinine 9.22 9.55 - 1.00 mg/dL   Calcium  8.9 8.9 - 10.3 mg/dL   Total Protein 6.0 (L) 6.5 - 8.1 g/dL   Albumin  3.7 3.5 - 5.0 g/dL   AST 23 15 - 41 U/L   ALT 18 0 - 44 U/L   Alkaline Phosphatase 96 38 - 126 U/L   Total Bilirubin 0.3 0.0 - 1.2 mg/dL   GFR, Estimated >39 >39 mL/min   Anion gap 5 5 - 15     RADIOGRAPHIC STUDIES:  I have personally reviewed the radiological images as listed and agree with the  findings in the report.  IR IMAGING GUIDED PORT INSERTION Result Date: 01/07/2024 INDICATION: For Chemotherapy EXAM: IMPLANTED PORT A CATH PLACEMENT WITH ULTRASOUND AND FLUOROSCOPIC GUIDANCE MEDICATIONS: No antibiotic was administered.  25 mg Benadryl  IV. ANESTHESIA/SEDATION: Moderate (conscious) sedation was employed during this procedure. A total of Versed  1.5 mg and Fentanyl  75 mcg was administered intravenously. Moderate Sedation Time: 19 in minutes. The patient's level of consciousness and vital signs were monitored continuously by radiology nursing throughout the procedure under my direct supervision. FLUOROSCOPY: Radiation Exposure Index and estimated peak skin dose (PSD); Reference air kerma (RAK), 0 mGy. COMPLICATIONS: None immediate. PROCEDURE: The procedure, risks, benefits, and alternatives were explained to the patient. Questions regarding the procedure were encouraged and answered. The patient understands and consents to the procedure. The RIGHT neck and chest were prepped with chlorhexidine  in a sterile fashion, and a sterile drape was applied covering the operative field. Maximum barrier sterile technique with sterile gowns and gloves were used for the procedure. A timeout was performed prior to the initiation of the procedure. Local anesthesia was provided with 1% lidocaine  with epinephrine . After creating a small venotomy incision, a micropuncture kit was utilized to access the internal jugular vein under direct, real-time ultrasound guidance. Ultrasound image documentation was performed. The microwire was kinked to measure appropriate catheter length. A subcutaneous port pocket was then created along the upper chest wall utilizing a combination of sharp and blunt dissection. The pocket was irrigated with sterile  saline. A single lumen power injectable port was chosen for placement. The 8 Fr catheter was tunneled from the port pocket site to the venotomy incision. The port was placed in the  pocket. The external catheter was trimmed to appropriate length. At the venotomy, an 8 Fr peel-away sheath was placed over a guidewire under fluoroscopic guidance. The catheter was then placed through the sheath and the sheath was removed. Final catheter positioning was confirmed and documented with a fluoroscopic spot radiograph. The port was accessed with a Huber needle, aspirated and flushed with heparinized saline. The port pocket incision was closed with interrupted 3-0 Vicryl suture then Dermabond was applied, including at the venotomy incision. Dressings were placed. The patient tolerated the procedure well without immediate post procedural complication. IMPRESSION: Successful placement of a RIGHT internal jugular approach power injectable Port-A-Cath. The tip of the catheter is positioned at the superior cavo-atrial junction. the catheter is ready for immediate use. Thom Hall, MD [facility] Electronically Signed   By: Thom Hall M.D.   On: 01/07/2024 15:37   NM PET Image Initial (PI) Skull Base To Thigh Result Date: 12/29/2023 CLINICAL DATA:  Initial treatment strategy for gallbladder carcinoma. EXAM: NUCLEAR MEDICINE PET SKULL BASE TO THIGH TECHNIQUE: 9.05 mCi F-18 FDG was injected intravenously. Full-ring PET imaging was performed from the skull base to thigh after the radiotracer. CT data was obtained and used for attenuation correction and anatomic localization. Fasting blood glucose: 118 mg/dl COMPARISON:  MRI abdomen and CT abdomen pelvis 12/14/2023. Chest CT 12/23/2022. FINDINGS: Mediastinal blood pool activity: SUV max 2.8 Liver activity: SUV max 2.9 NECK: No specific abnormal uptake seen above blood pool in the neck including along lymph node change of the submandibular, posterior triangle or internal jugular region. Near symmetric uptake of the visualized intracranial compartment. Incidental CT findings: The parotid glands,  submandibular glands and thyroid  gland are unremarkable scattered vascular calcifications in the neck. Mastoid air cells are clear. The visualized paranasal sinuses are clear. CHEST: There is no abnormal uptake seen above blood pool in the axillary regions, hilum or mediastinum. Slight uptake along the lower esophagus but there is a small hiatal hernia. Lungs demonstrate several scattered nodules bilaterally. Many of which are under 5 mm but there are some additional dominant foci seen. Example right lower lobe medially measuring 11 mm on series 6, image 70. This likely other larger foci are hypermetabolic. The specific lesion has maximum SUV of 4.6. Another example medial left lower lobe on image 64 measuring 9 mm in AP dimension has uptake of maximum SUV of 2.4. Again low but the lesions are small. Right upper lobe focus has dimension on image 49 of 9 mm. This abuts the pleura anteriorly and has maximum SUV of 3.7. The lesions are worrisome for lung metastases. Estimating up to 30 nodules are seen in each lung. Incidental CT findings: Some breathing motion. Dependent basilar atelectasis. No consolidation, pneumothorax or effusion. Heart is nonenlarged. Coronary artery calcifications are seen. The thoracic aorta has a normal course and caliber. There are some small mediastinal nodes identified, less than a cm in short axis and not pathologic by size criteria. These do not have abnormal uptake at this time. ABDOMEN/PELVIS: As seen on the prior examinations there is masslike appearance to the gallbladder in the adjacent portion of the liver in segment 4 with uptake. The uptake in segment 4 has maximum SUV of 6.8 consistent with neoplasm. A few small areas elsewhere in left  hepatic lobe seen such as image 80 of the PET scan measuring maximum SUV value of 5.1. This area measures up to a cm in size. Several additional foci seen such as image 76, 77. There is also a surface nodule along the right hepatic lobe laterally  which is hypermetabolic of maximum SUV value of 7.0. Slight thickening in this location on image 886 of the liver measuring 7 mm of the capsule. There also hypermetabolic nodules elsewhere including in the mesentery left upper quadrant which has a focus of maximum SUV value of 6.0 and on image 83 of the CT scan measures 11 by 9 mm. Additional area in the anterior pelvic mesentery on image 122 in the midline measures 3.3 x 1.5 cm and has uptake of 5.3 maximum SUV. Other areas as well. Pelvic lesions are identified which are hypermetabolic in the area of the adnexa. These could be additional areas of disease including Krunkenberg tumors. The larger focus on the left on image 127 of the CT scan measures 4.4 x 3.1 cm with maximum SUV of 17.1. The right-sided focus has maximum SUV of 13.3 and a smaller on image 129 measuring 17 by 15 mm. There are few retroperitoneal nodes which have some uptake including aortocaval, portacaval and periaortic. Example left para-aortic node which has maximum SUV of 5.0, on image 96 measures 10 by 7 mm. Portacaval node on image 83 measures 2.5 x 1.3 cm and has maximum SUV of 7.9. Incidental CT findings: Bowel is nondilated. Scattered colonic stool. Few air-fluid levels along the colon. Spleen is nonenlarged. Bilateral adrenal nodular to are identified which are without abnormal uptake. These could be adenomas but are indeterminate on this examination. However these lesions have been present and stable since at least August 2022 demonstrating long-term stability and likely benign etiology. Scattered vascular calcifications identified along the aorta and branch vessels. Area of potential stenosis along the SMA. Mild mesenteric stranding. No frank ascites. No definite renal or ureteral stones. Underdistended urinary bladder. SKELETON: No abnormal uptake identified along the visualized osseous structures. Incidental CT findings: Significant streak artifact from the extensive hardware fixation  throughout the lower thoracic and lumbar spine extending into the upper sacrum. Curvature and degenerative changes are noted along the spine. Degenerative changes elsewhere as well. Areas of canal encroachment seen suggested along the midthoracic spine such as at T5-6 level. Osteophytes seen extending towards the central canal. Laminectomy changes along the lower lumbar spine as well. IMPRESSION: Focal hypermetabolic mass lesion involving the gallbladder extending into segment 4B of the liver. Additional small areas of uptake in the left hepatic lobe. Please correlate for the finding seen on recent MRI of the liver. Areas of peritoneal carcinomatosis with several mesenteric nodules and liver surface. There is also presence of pelvic lesions which are hypermetabolic, possible drop metastases,Krunkenberg like tumors. Numerous lung nodules identified. Many of which are quite small and have no rim minimal uptake. Some of the larger lesions have more intense uptake in measure up to 11 mm. These are worrisome for multifocal lung metastases. Few hypermetabolic upper abdominal lymph nodes including portacaval and retroperitoneal consistent with spread of disease. Extensive spinal fixation hardware with degenerative changes and laminectomy. Areas of potential stenosis. Electronically Signed   By: Ranell Bring M.D.   On: 12/29/2023 15:25    CODE STATUS:  Code Status History     Date Active Date Inactive Code Status Order ID Comments User Context   12/14/2023 1601 12/16/2023 2004 Limited: Do not attempt resuscitation (DNR) -  DNR-LIMITED -Do Not Intubate/DNI  [id number]  Caleen Burgess BROCKS, MD ED   09/17/2022 1236 09/18/2022 1519 Full Code [id number]  Onetha Kuba, MD Inpatient   02/27/2021 1602 03/09/2021 2325 Full Code [id number]  Onetha Kuba, MD Inpatient   06/27/2020 1110 06/28/2020 0504 Full Code [id number]  Derrill Dawn, MD H[facility]    Questions for Most Recent Historical Code Status (Order [id number])     Question Answer   If  pulseless and not breathing No CPR or chest compressions.   In Pre-Arrest Conditions (Patient Is Breathing and Has A Pulse) Do not intubate. Provide all appropriate non-invasive medical interventions. Avoid ICU transfer unless indicated or required.   Consent: Discussion documented in EHR or advanced directives reviewed                Advance Directive Documentation    Flowsheet Row Most Recent Value  Type of Advance Directive Healthcare Power of Attorney, Living will  Pre-existing out of facility DNR order (yellow form or pink MOST form) --  MOST Form in Place? --    Orders Placed This Encounter  Procedures   CBC with Differential (Cancer Center Only)    Standing Status:   Future    Expected Date:   02/11/2024    Expiration Date:   02/10/2025   CMP (Cancer Center only)    Standing Status:   Future    Expected Date:   02/11/2024    Expiration Date:   02/10/2025   T4    Standing Status:   Future    Expected Date:   02/11/2024    Expiration Date:   02/10/2025   TSH    Standing Status:   Future    Expected Date:   02/11/2024    Expiration Date:   02/10/2025   Magnesium     Standing Status:   Future    Expected Date:   02/11/2024    Expiration Date:   02/10/2025   CBC with Differential (Cancer Center Only)    Standing Status:   Future    Expected Date:   02/18/2024    Expiration Date:   02/17/2025   CMP (Cancer Center only)    Standing Status:   Future    Expected Date:   02/18/2024    Expiration Date:   02/17/2025   Magnesium     Standing Status:   Future    Expected Date:   02/18/2024    Expiration Date:   02/17/2025   CBC with Differential (Cancer Center Only)    Standing Status:   Future    Expected Date:   03/03/2024    Expiration Date:   03/03/2025   CMP (Cancer Center only)    Standing Status:   Future    Expected Date:   03/03/2024    Expiration Date:   03/03/2025   Magnesium     Standing Status:   Future    Expected Date:   03/03/2024    Expiration Date:   03/03/2025   CBC  with Differential (Cancer Center Only)    Standing Status:   Future    Expected Date:   03/10/2024    Expiration Date:   03/10/2025   CMP (Cancer Center only)    Standing Status:   Future    Expected Date:   03/10/2024    Expiration Date:   03/10/2025   Magnesium     Standing Status:   Future    Expected Date:   03/10/2024    Expiration Date:  03/10/2025     Future Appointments  Date Time Provider Department Center  01/21/2024  8:15 AM [facility] INFUSION [facility] None  01/27/2024 10:15 AM [facility] LAB [facility] None  01/27/2024 10:45 AM Caidance Sybert, MD [facility] None  01/28/2024  8:15 AM [facility] INFUSION [facility] None  02/01/2024 10:45 AM [facility] INFUSION [facility] None  03/04/2024  8:30 AM Dewald, Dorn NOVAK, MD [facility] None      This document was completed utilizing speech recognition software. Grammatical errors, random word insertions, pronoun errors, and incomplete sentences are an occasional consequence of this system due to software limitations, ambient noise, and hardware issues. Any formal questions or concerns about the content, text or information contained within the body of this dictation should be directly addressed to the provider for clarification.

## 2024-01-20 NOTE — Progress Notes (Signed)
 Patient seen by Dr. Chinita Pasam today  Vitals are within treatment parameters:Yes   Labs are within treatment parameters: No (Please specify and give further instructions.)  WBC:   15.7  Treatment plan has been signed: Yes   Per physician team, Patient is ready for treatment and there are NO modifications to the treatment plan.   Per Dr. Autumn:  She is okay to proceed with treatment tomorrow. No changes. Elevated white count noted, from the booster injection.

## 2024-01-21 ENCOUNTER — Inpatient Hospital Stay

## 2024-01-21 ENCOUNTER — Encounter (HOSPITAL_COMMUNITY): Payer: Self-pay

## 2024-01-21 VITALS — BP 128/54 | HR 62 | Temp 98.2°F | Resp 17 | Wt 162.0 lb

## 2024-01-21 DIAGNOSIS — C23 Malignant neoplasm of gallbladder: Secondary | ICD-10-CM

## 2024-01-21 DIAGNOSIS — Z5112 Encounter for antineoplastic immunotherapy: Secondary | ICD-10-CM | POA: Diagnosis not present

## 2024-01-21 MED ORDER — SODIUM CHLORIDE 0.9 % IV SOLN
25.0000 mg/m2 | Freq: Once | INTRAVENOUS | Status: AC
  Administered 2024-01-21: 50 mg via INTRAVENOUS
  Filled 2024-01-21: qty 50

## 2024-01-21 MED ORDER — PALONOSETRON HCL INJECTION 0.25 MG/5ML
0.2500 mg | Freq: Once | INTRAVENOUS | Status: AC
  Administered 2024-01-21: 0.25 mg via INTRAVENOUS
  Filled 2024-01-21: qty 5

## 2024-01-21 MED ORDER — SODIUM CHLORIDE 0.9 % IV SOLN: INTRAVENOUS | Status: DC

## 2024-01-21 MED ORDER — DEXAMETHASONE SODIUM PHOSPHATE 10 MG/ML IJ SOLN
10.0000 mg | Freq: Once | INTRAMUSCULAR | Status: AC
  Administered 2024-01-21: 10 mg via INTRAVENOUS
  Filled 2024-01-21: qty 1

## 2024-01-21 MED ORDER — SODIUM CHLORIDE 0.9% FLUSH
10.0000 mL | INTRAVENOUS | Status: DC | PRN
  Administered 2024-01-21: 10 mL

## 2024-01-21 MED ORDER — MAGNESIUM SULFATE 2 GM/50ML IV SOLN
2.0000 g | Freq: Once | INTRAVENOUS | Status: AC
  Administered 2024-01-21: 2 g via INTRAVENOUS
  Filled 2024-01-21: qty 50

## 2024-01-21 MED ORDER — APREPITANT 130 MG/18ML IV EMUL
130.0000 mg | Freq: Once | INTRAVENOUS | Status: AC
  Administered 2024-01-21: 130 mg via INTRAVENOUS
  Filled 2024-01-21: qty 18

## 2024-01-21 MED ORDER — SODIUM CHLORIDE 0.9 % IV SOLN
1000.0000 mg/m2 | Freq: Once | INTRAVENOUS | Status: AC
  Administered 2024-01-21: 1786 mg via INTRAVENOUS
  Filled 2024-01-21: qty 46.97

## 2024-01-21 MED ORDER — POTASSIUM CHLORIDE IN NACL 20-0.9 MEQ/L-% IV SOLN
Freq: Once | INTRAVENOUS | Status: AC
  Filled 2024-01-21: qty 1000

## 2024-01-21 MED ORDER — HEPARIN SOD (PORK) LOCK FLUSH 100 UNIT/ML IV SOLN
500.0000 [IU] | Freq: Once | INTRAVENOUS | Status: AC | PRN
Start: 2024-01-21 — End: 2024-01-21
  Administered 2024-01-21: 500 [IU]

## 2024-01-21 MED ORDER — SODIUM CHLORIDE 0.9 % IV SOLN
1500.0000 mg | Freq: Once | INTRAVENOUS | Status: AC
  Administered 2024-01-21: 1500 mg via INTRAVENOUS
  Filled 2024-01-21: qty 30

## 2024-01-21 NOTE — Patient Instructions (Signed)
 [facility] - A DEPT OF [facility]  Discharge Instructions: Thank you for choosing [facility] to provide your oncology and hematology care.   If you have a lab appointment with the Cancer Center, please go directly to the Cancer Center and check in at the registration area.   Wear comfortable clothing and clothing appropriate for easy access to any Portacath or PICC line.   We strive to give you quality time with your provider. You may need to reschedule your appointment if you arrive late (15 or more minutes).  Arriving late affects you and other patients whose appointments are after yours.  Also, if you miss three or more appointments without notifying the office, you may be dismissed from the clinic at the provider's discretion.      For prescription refill requests, have your pharmacy contact our office and allow 72 hours for refills to be completed.    Today you received the following chemotherapy and/or immunotherapy agents: Durvalumab (Imfinzi), Gemcitabine (Gemzar), Cisplatin   To help prevent nausea and vomiting after your treatment, we encourage you to take your nausea medication as directed.  BELOW ARE SYMPTOMS THAT SHOULD BE REPORTED IMMEDIATELY: *FEVER GREATER THAN 100.4 F (38 C) OR HIGHER *CHILLS OR SWEATING *NAUSEA AND VOMITING THAT IS NOT CONTROLLED WITH YOUR NAUSEA MEDICATION *UNUSUAL SHORTNESS OF BREATH *UNUSUAL BRUISING OR BLEEDING *URINARY PROBLEMS (pain or burning when urinating, or frequent urination) *BOWEL PROBLEMS (unusual diarrhea, constipation, pain near the anus) TENDERNESS IN MOUTH AND THROAT WITH OR WITHOUT PRESENCE OF ULCERS (sore throat, sores in mouth, or a toothache) UNUSUAL RASH, SWELLING OR PAIN  UNUSUAL VAGINAL DISCHARGE OR ITCHING   Items with * indicate a potential emergency and should be followed up as soon as possible or go to the Emergency Department if any problems should occur.  Please show the  CHEMOTHERAPY ALERT CARD or IMMUNOTHERAPY ALERT CARD at check-in to the Emergency Department and triage nurse.  Should you have questions after your visit or need to cancel or reschedule your appointment, please contact [facility] - A DEPT OF [facility]  Dept: [phone]  and follow the prompts.  Office hours are 8:00 a.m. to 4:30 p.m. Monday - Friday. Please note that voicemails left after 4:00 p.m. may not be returned until the following business day.  We are closed weekends and major holidays. You have access to a nurse at all times for urgent questions. Please call the main number to the clinic Dept: [phone] and follow the prompts.   For any non-urgent questions, you may also contact your provider using MyChart. We now offer e-Visits for anyone 35 and older to request care online for non-urgent symptoms. For details visit [email or web address].   Also download the MyChart app! Go to the app store, search MyChart, open the app, select [facility], and log in with your MyChart username and password.

## 2024-01-27 ENCOUNTER — Encounter: Payer: Self-pay | Admitting: Oncology

## 2024-01-27 ENCOUNTER — Inpatient Hospital Stay

## 2024-01-27 ENCOUNTER — Other Ambulatory Visit: Payer: Self-pay

## 2024-01-27 ENCOUNTER — Other Ambulatory Visit (HOSPITAL_COMMUNITY): Payer: Self-pay

## 2024-01-27 ENCOUNTER — Inpatient Hospital Stay (HOSPITAL_BASED_OUTPATIENT_CLINIC_OR_DEPARTMENT_OTHER): Admitting: Oncology

## 2024-01-27 VITALS — BP 133/54 | HR 67 | Temp 97.7°F | Resp 18 | Ht 62.0 in | Wt 156.1 lb

## 2024-01-27 DIAGNOSIS — Z5112 Encounter for antineoplastic immunotherapy: Secondary | ICD-10-CM | POA: Diagnosis not present

## 2024-01-27 DIAGNOSIS — R630 Anorexia: Secondary | ICD-10-CM

## 2024-01-27 DIAGNOSIS — G893 Neoplasm related pain (acute) (chronic): Secondary | ICD-10-CM

## 2024-01-27 DIAGNOSIS — C23 Malignant neoplasm of gallbladder: Secondary | ICD-10-CM

## 2024-01-27 LAB — CBC WITH DIFFERENTIAL (CANCER CENTER ONLY)
Abs Immature Granulocytes: 0.02 K/uL (ref 0.00–0.07)
Basophils Absolute: 0 K/uL (ref 0.0–0.1)
Basophils Relative: 1 %
Eosinophils Absolute: 0 K/uL (ref 0.0–0.5)
Eosinophils Relative: 0 %
HCT: 31.4 % — ABNORMAL LOW (ref 36.0–46.0)
Hemoglobin: 10.9 g/dL — ABNORMAL LOW (ref 12.0–15.0)
Immature Granulocytes: 1 %
Lymphocytes Relative: 26 %
Lymphs Abs: 1.1 K/uL (ref 0.7–4.0)
MCH: 29.8 pg (ref 26.0–34.0)
MCHC: 34.7 g/dL (ref 30.0–36.0)
MCV: 85.8 fL (ref 80.0–100.0)
Monocytes Absolute: 0.4 K/uL (ref 0.1–1.0)
Monocytes Relative: 8 %
Neutro Abs: 2.7 K/uL (ref 1.7–7.7)
Neutrophils Relative %: 64 %
Platelet Count: 270 K/uL (ref 150–400)
RBC: 3.66 MIL/uL — ABNORMAL LOW (ref 3.87–5.11)
RDW: 13.7 % (ref 11.5–15.5)
WBC Count: 4.2 K/uL (ref 4.0–10.5)
nRBC: 0 % (ref 0.0–0.2)

## 2024-01-27 LAB — CMP (CANCER CENTER ONLY)
ALT: 25 U/L (ref 0–44)
AST: 29 U/L (ref 15–41)
Albumin: 3.8 g/dL (ref 3.5–5.0)
Alkaline Phosphatase: 75 U/L (ref 38–126)
Anion gap: 5 (ref 5–15)
BUN: 17 mg/dL (ref 8–23)
CO2: 27 mmol/L (ref 22–32)
Calcium: 9.1 mg/dL (ref 8.9–10.3)
Chloride: 108 mmol/L (ref 98–111)
Creatinine: 0.81 mg/dL (ref 0.44–1.00)
GFR, Estimated: >60 mL/min (ref >60)
Glucose, Bld: 114 mg/dL — ABNORMAL HIGH (ref 70–99)
Potassium: 4.4 mmol/L (ref 3.5–5.1)
Sodium: 140 mmol/L (ref 135–145)
Total Bilirubin: 0.5 mg/dL (ref 0.0–1.2)
Total Protein: 6.5 g/dL (ref 6.5–8.1)

## 2024-01-27 LAB — MAGNESIUM: Magnesium: 1.7 mg/dL (ref 1.7–2.4)

## 2024-01-27 MED ORDER — MEGESTROL ACETATE 625 MG/5ML PO SUSP
625.0000 mg | Freq: Every day | ORAL | 3 refills | Status: DC
  Filled 2024-01-27: qty 150, 30d supply, fill #0
  Filled 2024-04-11 – 2024-04-19 (×2): qty 150, 30d supply, fill #1

## 2024-01-27 NOTE — Assessment & Plan Note (Signed)
 Please review oncology history for additional details and timeline of events.  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 Biopsy from the omentum confirmed adenocarcinoma, originating from the gallbladder.  Immunostains support diagnosis of gallbladder carcinoma.    Previously I discussed diagnosis, staging, prognosis, plan of care, treatment options.  Reviewed NCCN guidelines.   Given stage IV disease, all treatment options are palliative in nature and not curative in intent.  This was discussed with patient, her daughter and son-in-law who were accompanying.  They verbalized understanding.    Discussed the aggressive nature of stage IV gallbladder adenocarcinoma and overall poor prognosis.  Life expectancy with treatment is 1 to 1.5 years; without treatment, less than six months.   We submitted NGS panel testing on the specimen and also liquid biopsy for mutation analysis.  Staging PET scan on 12/29/2023 showed known gallbladder mass extending into the liver.  Peritoneal carcinomatosis noted.  Numerous lung nodules were identified, small bowel with FDG uptake, concerning for metastatic disease in the lungs.  Metastatic lymphadenopathy was noted in the abdomen and retroperitoneum.  This scan will serve as a good baseline for future compression.  Discussed results with the patient and family members previously.   Plan made to proceed with palliative systemic chemoimmunotherapy with gemcitabine , cisplatin  and durvalumab .  She started this from 01/01/2024.  Tolerating chemo well so far.  No major side effects.  Labs today reveal no dose-limiting toxicities.  Will proceed with cycle 2-day 8 tomorrow as scheduled.  Will proceed with G-CSF support with this cycle.  If leukocytosis happens again, then we will start skipping G-CSF support after neck cycle.  - Ensure hydration and nutrition are maintained during treatment.  RTC in 2 weeks for labs, follow-up and continuation  of chemotherapy.

## 2024-01-27 NOTE — Progress Notes (Signed)
 Patient seen by Dr. Chinita Pasam today  Vitals are within treatment parameters:Yes   Labs are within treatment parameters: Yes   Treatment plan has been signed: Yes   Per physician team, Patient is ready for treatment and there are NO modifications to the treatment plan.   Okay to proceed with tx tomorrow per Dr. Autumn.

## 2024-01-27 NOTE — Progress Notes (Signed)
 [facility]  ONCOLOGY CLINIC PROGRESS NOTE   Patient Care Team: [name] as PCP - General (Physician Assistant) Pcp, [name], DO (Family Medicine) Pa, [facility] [name], RN as Oncology Nurse Navigator  PATIENT NAME: Janice Pace   MR#: [id number] DOB: March 23, 1946  Date of visit: 01/27/2024   ASSESSMENT & PLAN:   Janice Pace is a 78 y.o. 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   Adenocarcinoma of gallbladder [facility]) Please review oncology history for additional details and timeline of events.  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 Biopsy from the omentum confirmed adenocarcinoma, originating from the gallbladder.  Immunostains support diagnosis of gallbladder carcinoma.    Previously I discussed diagnosis, staging, prognosis, plan of care, treatment options.  Reviewed NCCN guidelines.   Given stage IV disease, all treatment options are palliative in nature and not curative in intent.  This was discussed with patient, her daughter and son-in-law who were accompanying.  They verbalized understanding.    Discussed the aggressive nature of stage IV gallbladder adenocarcinoma and overall poor prognosis.  Life expectancy with treatment is 1 to 1.5 years; without treatment, less than six months.   We submitted NGS panel testing on the specimen and also liquid biopsy for mutation analysis.  Staging PET scan on 12/29/2023 showed known gallbladder mass extending into the liver.  Peritoneal carcinomatosis noted.  Numerous lung nodules were identified, small bowel with FDG uptake, concerning for metastatic disease in the lungs.  Metastatic  lymphadenopathy was noted in the abdomen and retroperitoneum.  This scan will serve as a good baseline for future compression.  Discussed results with the patient and family members previously.   Plan made to proceed with palliative systemic chemoimmunotherapy with gemcitabine , cisplatin  and durvalumab .  She started this from 01/01/2024.  Tolerating chemo well so far.  No major side effects.  Labs today reveal no dose-limiting toxicities.  Will proceed with cycle 2-day 8 tomorrow as scheduled.  Will proceed with G-CSF support with this cycle.  If leukocytosis happens again, then we will start skipping G-CSF support after neck cycle.  - Ensure hydration and nutrition are maintained during treatment.  RTC in 2 weeks for labs, follow-up and continuation of chemotherapy.  Chemotherapy-induced appetite loss Chemotherapy-induced appetite loss has worsened since starting chemotherapy. She reports a lack of appetite and has lost six pounds since last week. She is currently using protein supplements. - Prescribe Megace  liquid medication to boost appetite, to be taken once daily. - Send prescription to the pharmacy.  I reviewed lab results and outside records for this visit and discussed relevant results with the patient. Diagnosis, plan of care and treatment options were also discussed in detail with the patient. Opportunity provided to ask questions and answers provided to her apparent satisfaction. Provided instructions to call our clinic with any problems, questions or concerns prior to return visit. I recommended to continue follow-up with PCP and sub-specialists. She verbalized understanding and agreed with the plan.   NCCN guidelines have been consulted in the planning of this patient's care.  I spent a total of 30 minutes during this encounter with the patient including review of chart and various tests results, discussions about plan of care and coordination  of care plan.   Chinita Patten, MD   01/27/2024 5:44 PM  [facility] - [facility] [address] [place] [address] Dept: [phone] Dept Fax: [phone]    CHIEF COMPLAINT/ REASON FOR VISIT:   Stage IV gallbladder adenocarcinoma with peritoneal mets and lung mets.  Current Treatment: Palliative systemic chemoimmunotherapy with cisplatin , gemcitabine , durvalumab  started from 01/01/2024.  INTERVAL HISTORY:    Discussed the use of AI scribe software for clinical note transcription with the patient, who gave verbal consent to proceed.  History of Present Illness  [name] is a 78 year old female undergoing chemotherapy who presents with decreased appetite and weight loss.  She has experienced a decreased appetite, which has worsened since starting chemotherapy. Previously, she was on a diet, but her appetite has further declined. No significant issues related to chemotherapy, such as fatigue, are reported.  She has lost approximately six pounds since the previous week. To manage her nutritional intake, she is currently using protein supplements.  Current blood work shows a normal white blood cell count, although it is on the lower side of normal. Hemoglobin level is 10.9, slightly decreased from 11 the previous week. Platelets and other chemistries, including kidney and liver function tests, are normal.  No appetite and no fatigue.     I have reviewed the past medical history, past surgical history, social history and family history with the patient and they are unchanged from previous note.  HISTORY OF PRESENT ILLNESS:   ONCOLOGY HISTORY:   78 y.o. lady with past medical history of essential hypertension, osteoarthritis, COPD/asthma, GERD, depression, anxiety, hiatal hernia, chronic back pain presented to the ED on 12/14/2023 with complaints of worsening epigastric/abdominal pain over 2-3 weeks.  Routine blood work was  unremarkable in the ER.  However CT abdomen and pelvis showed irregular appearance of the gallbladder with suggestion of capsular thickening along the fundus of the gallbladder.  Gallbladder appeared inseparable from the adjacent portion of the liver.  Clinical picture was concerning for gallbladder carcinoma.  Mesenteric nodular thickening along the mid lower abdominal cavity indicating peritoneal carcinomatosis.  Soft tissue nodular mass in the left lower quadrant region of the pelvis measuring 3.6 x 2.7 cm, likely ovarian neoplastic lesion.  Multiple lung nodules in the lower lobes bilaterally, largest measuring 12 mm in the right lower lobe, concerning for pulmonary metastatic disease.   She was admitted to the hospital for further evaluation and management.  IR was consulted for peritoneal mass biopsy.  CA 19-9 was significant elevated at 14,368.  CEA was increased at 98.7, CA-125 was also increased at 91.3.   On 12/14/2023, MRI abdomen/MRCP showed thickened, contracted gallbladder with a heterogeneous mass arising from the gallbladder fundus, contiguous with the adjacent liver parenchyma of hepatic segment IVB measuring 3.5 x 2.8 cm. Multiple small rim enhancing lesions within the liver parenchyma. Enlarged, necrotic appearing portacaval and porta hepatis lymph nodes. Small, although rim enhancing and abnormal appearing retroperitoneal lymph nodes. Peritoneal stranding and nodularity, incompletely visualized on this examination although present in the left upper quadrant, consistent with peritoneal metastatic disease. Numerous small bilateral pulmonary nodules, better assessed by CT. 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   On 12/15/2023, she underwent CT-guided biopsy of the omental lesion.  Pathology showed metastatic well to moderately differentiated adenocarcinoma. The tumor is positive for cytokeratin 7 and shows focal weak positivity  for  the GI marker CDX2.  The tumor is negative for the GI markers cytokeratin 20.  The tumor is also negative for the GU and GYN marker PAX8.  The tumor is negative for the pulmonary adeno marker TTF-1. This immunohistochemical pattern and histomorphology would be compatible with the clinical suspicion of a gallbladder primary.  The differential diagnosis would also include an upper GI or pancreaticobiliary primary.    Patient presented to our clinic on 12/24/2023 to establish care with us .  Request placed for staging PET scan.  NGS panel testing requested on the specimen and also liquid biopsy obtained.  It showed no actionable mutations.  Staging PET scan on 12/29/2023 showed known gallbladder mass extending into the liver.  Peritoneal carcinomatosis noted.  Numerous lung nodules were identified, small bowel with FDG uptake, concerning for metastatic disease in the lungs.  Metastatic lymphadenopathy was noted in the abdomen and retroperitoneum.  This scan will serve as a good baseline for future compression.    Plan for palliative systemic chemoimmunotherapy with cisplatin , gemcitabine , durvalumab . Scheduled to start C1D1 from 01/01/24.   Oncology History  Adenocarcinoma of gallbladder (HCC)  12/24/2023 Initial Diagnosis   Adenocarcinoma of gallbladder (HCC)   12/24/2023 Cancer Staging   Staging form: Gallbladder, AJCC 8th Edition - Clinical: Stage IVB (cTX, cNX, pM1) - Signed by Autumn Millman, MD on 12/24/2023 Histologic grade (G): G2 Histologic grading system: 3 grade system   01/01/2024 -  Chemotherapy   Patient is on Treatment Plan : BILIARY TRACT Cisplatin  + Gemcitabine  D1,8 + Durvalumab  (1500) D1 q21d / Durvalumab  (1500) q28d         REVIEW OF SYSTEMS:   Review of Systems - Oncology  All other pertinent systems were reviewed with the patient and are negative.  ALLERGIES: She is allergic to sulfa antibiotics, forteo  [parathyroid hormone (recomb)], atorvastatin, and metformin  hcl.  MEDICATIONS:  Current Outpatient Medications  Medication Sig Dispense Refill   albuterol  (VENTOLIN  HFA) 108 (90 Base) MCG/ACT inhaler Inhale 1-2 puffs into the lungs every 6 (six) hours as needed for wheezing or shortness of breath. 8 g 1   aspirin  81 MG EC tablet Take 81 mg by mouth daily.     budesonide  (PULMICORT ) 0.5 MG/2ML nebulizer solution Generic for Pulmicort . Inhale one vial in nebulizer twice a day. Rinse mouth after use. 60 mL 11   dexamethasone  (DECADRON ) 4 MG tablet Take 2 tablets (8 mg) by mouth daily x 3 days starting the day after cisplatin  chemotherapy. Take with food. 30 tablet 1   docusate sodium  (COLACE) 100 MG capsule Take 500 mg by mouth at bedtime.     formoterol  (PERFOROMIST ) 20 MCG/2ML nebulizer solution Substituted for: Perforomist  Solution Inhale one vial in nebulizer twice a day. 60 mL 11   megestrol  (MEGACE  ES) 625 MG/5ML suspension Take 5 mLs (625 mg total) by mouth daily. 150 mL 3   metoprolol  succinate (TOPROL -XL) 50 MG 24 hr tablet TAKE 1 TABLET BY MOUTH ONCE DAILY. TAKE WITH OR IMMEDIATELY FOLLOWING A MEAL 90 tablet 1   morphine  (MS CONTIN ) 15 MG 12 hr tablet Take 1 tablet (15 mg total) by mouth every 12 (twelve) hours. (Patient taking differently: Take 15 mg by mouth every 12 (twelve) hours. Taking once daily) 60 tablet 0   omeprazole  (PRILOSEC) 20 MG capsule Take 1 capsule (20 mg total) by mouth 2 (two) times daily before a meal. 60 capsule 3   ondansetron  (ZOFRAN ) 4 MG tablet Take 1 tablet (4 mg total) by  mouth every 6 (six) hours as needed for nausea. 30 tablet 0   ondansetron  (ZOFRAN ) 8 MG tablet Take 1 tablet (8 mg total) by mouth every 8 (eight) hours as needed for nausea or vomiting. Start on the third day after cisplatin . 30 tablet 1   oxyCODONE  10 MG TABS Take 1-1.5 tablets (10-15 mg total) by mouth every 4 (four) hours as needed for moderate pain (pain score 4-6) or severe pain (pain score 7-10). (Patient taking differently: Take 10-15 mg by mouth  every 4 (four) hours as needed for moderate pain (pain score 4-6) or severe pain (pain score 7-10). Pt reports she takes this once in awhile) 60 tablet 0   polyethylene glycol (MIRALAX  / GLYCOLAX ) 17 g packet Take 17 g by mouth 2 (two) times daily.     prochlorperazine  (COMPAZINE ) 10 MG tablet Take 1 tablet (10 mg total) by mouth every 6 (six) hours as needed (Nausea or vomiting). 30 tablet 1   revefenacin  (YUPELRI ) 175 MCG/3ML nebulizer solution Take 3 mLs (175 mcg total) by nebulization daily.     rosuvastatin  (CRESTOR ) 10 MG tablet Take 1 tablet (10 mg total) by mouth daily. 90 tablet 1   lidocaine -prilocaine  (EMLA ) cream Apply to affected area once (Patient not taking: Reported on 01/27/2024) 30 g 3   No current facility-administered medications for this visit.     VITALS:   Blood pressure (!) 133/54, pulse 67, temperature 97.7 F (36.5 C), temperature source Temporal, resp. rate 18, height 5' 2 (1.575 m), weight 156 lb 1.6 oz (70.8 kg), SpO2 99%.  Wt Readings from Last 3 Encounters:  01/27/24 156 lb 1.6 oz (70.8 kg)  01/21/24 162 lb (73.5 kg)  01/20/24 161 lb (73 kg)    Body mass index is 28.55 kg/m.    Onc Performance Status - 01/27/24 1034       ECOG Perf Status   ECOG Perf Status Restricted in physically strenuous activity but ambulatory and able to carry out work of a light or sedentary nature, e.g., light house work, office work      KPS SCALE   KPS % SCORE Normal activity with effort, some s/s of disease           PHYSICAL EXAM:   Physical Exam Constitutional:      General: She is not in acute distress.    Appearance: Normal appearance.  HENT:     Head: Normocephalic and atraumatic.  Cardiovascular:     Rate and Rhythm: Normal rate.  Pulmonary:     Effort: Pulmonary effort is normal. No respiratory distress.  Abdominal:     General: There is no distension.  Neurological:     General: No focal deficit present.     Mental Status: She is alert and  oriented to person, place, and time.  Psychiatric:        Mood and Affect: Mood normal.        Behavior: Behavior normal.       LABORATORY DATA:   I have reviewed the data as listed.  Results for orders placed or performed in visit on 01/27/24  CBC with Differential (Cancer Center Only)  Result Value Ref Range   WBC Count 4.2 4.0 - 10.5 K/uL   RBC 3.66 (L) 3.87 - 5.11 MIL/uL   Hemoglobin 10.9 (L) 12.0 - 15.0 g/dL   HCT 68.5 (L) 63.9 - 53.9 %   MCV 85.8 80.0 - 100.0 fL   MCH 29.8 26.0 - 34.0 pg  MCHC 34.7 30.0 - 36.0 g/dL   RDW 86.2 88.4 - 84.4 %   Platelet Count 270 150 - 400 K/uL   nRBC 0.0 0.0 - 0.2 %   Neutrophils Relative % 64 %   Neutro Abs 2.7 1.7 - 7.7 K/uL   Lymphocytes Relative 26 %   Lymphs Abs 1.1 0.7 - 4.0 K/uL   Monocytes Relative 8 %   Monocytes Absolute 0.4 0.1 - 1.0 K/uL   Eosinophils Relative 0 %   Eosinophils Absolute 0.0 0.0 - 0.5 K/uL   Basophils Relative 1 %   Basophils Absolute 0.0 0.0 - 0.1 K/uL   Immature Granulocytes 1 %   Abs Immature Granulocytes 0.02 0.00 - 0.07 K/uL  CMP (Cancer Center only)  Result Value Ref Range   Sodium 140 135 - 145 mmol/L   Potassium 4.4 3.5 - 5.1 mmol/L   Chloride 108 98 - 111 mmol/L   CO2 27 22 - 32 mmol/L   Glucose, Bld 114 (H) 70 - 99 mg/dL   BUN 17 8 - 23 mg/dL   Creatinine 9.18 9.55 - 1.00 mg/dL   Calcium  9.1 8.9 - 10.3 mg/dL   Total Protein 6.5 6.5 - 8.1 g/dL   Albumin  3.8 3.5 - 5.0 g/dL   AST 29 15 - 41 U/L   ALT 25 0 - 44 U/L   Alkaline Phosphatase 75 38 - 126 U/L   Total Bilirubin 0.5 0.0 - 1.2 mg/dL   GFR, Estimated >39 >39 mL/min   Anion gap 5 5 - 15  Magnesium   Result Value Ref Range   Magnesium  1.7 1.7 - 2.4 mg/dL     RADIOGRAPHIC STUDIES:  I have personally reviewed the radiological images as listed and agree with the findings in the report.  IR IMAGING GUIDED PORT INSERTION Result Date: 01/07/2024 INDICATION: For Chemotherapy EXAM: IMPLANTED PORT A CATH PLACEMENT WITH ULTRASOUND AND  FLUOROSCOPIC GUIDANCE MEDICATIONS: No antibiotic was administered.  25 mg Benadryl  IV. ANESTHESIA/SEDATION: Moderate (conscious) sedation was employed during this procedure. A total of Versed  1.5 mg and Fentanyl  75 mcg was administered intravenously. Moderate Sedation Time: 19 in minutes. The patient's level of consciousness and vital signs were monitored continuously by radiology nursing throughout the procedure under my direct supervision. FLUOROSCOPY: Radiation Exposure Index and estimated peak skin dose (PSD); Reference air kerma (RAK), 0 mGy. COMPLICATIONS: None immediate. PROCEDURE: The procedure, risks, benefits, and alternatives were explained to the patient. Questions regarding the procedure were encouraged and answered. The patient understands and consents to the procedure. The RIGHT neck and chest were prepped with chlorhexidine  in a sterile fashion, and a sterile drape was applied covering the operative field. Maximum barrier sterile technique with sterile gowns and gloves were used for the procedure. A timeout was performed prior to the initiation of the procedure. Local anesthesia was provided with 1% lidocaine  with epinephrine . After creating a small venotomy incision, a micropuncture kit was utilized to access the internal jugular vein under direct, real-time ultrasound guidance. Ultrasound image documentation was performed. The microwire was kinked to measure appropriate catheter length. A subcutaneous port pocket was then created along the upper chest wall utilizing a combination of sharp and blunt dissection. The pocket was irrigated with sterile saline. A single lumen power injectable port was chosen for placement. The 8 Fr catheter was tunneled from the port pocket site to the venotomy incision. The port was placed in the pocket. The external catheter was trimmed to appropriate length. At the venotomy, an 8 Fr  peel-away sheath was placed over a guidewire under fluoroscopic guidance. The catheter  was then placed through the sheath and the sheath was removed. Final catheter positioning was confirmed and documented with a fluoroscopic spot radiograph. The port was accessed with a Huber needle, aspirated and flushed with heparinized saline. The port pocket incision was closed with interrupted 3-0 Vicryl suture then Dermabond was applied, including at the venotomy incision. Dressings were placed. The patient tolerated the procedure well without immediate post procedural complication. IMPRESSION: Successful placement of a RIGHT internal jugular approach power injectable Port-A-Cath. The tip of the catheter is positioned at the superior cavo-atrial junction. the catheter is ready for immediate use. Thom Hall, MD [facility] Electronically Signed   By: Thom Hall M.D.   On: 01/07/2024 15:37    CODE STATUS:  Code Status History     Date Active Date Inactive Code Status Order ID Comments User Context   12/14/2023 1601 12/16/2023 2004 Limited: Do not attempt resuscitation (DNR) -DNR-LIMITED -Do Not Intubate/DNI  [id number]  Caleen Burgess BROCKS, MD ED   09/17/2022 1236 09/18/2022 1519 Full Code [id number]  Onetha Kuba, MD Inpatient   02/27/2021 1602 03/09/2021 2325 Full Code [id number]  Onetha Kuba, MD Inpatient   06/27/2020 1110 06/28/2020 0504 Full Code [id number]  Derrill Dawn, MD HOV    Questions for Most Recent Historical Code Status (Order [id number])     Question Answer   If pulseless and not breathing No CPR or chest compressions.   In Pre-Arrest Conditions (Patient Is Breathing and Has A Pulse) Do not intubate. Provide all appropriate non-invasive medical interventions. Avoid ICU transfer unless indicated or required.   Consent: Discussion documented in EHR or advanced directives reviewed                Advance Directive Documentation    Flowsheet Row Most Recent Value  Type of Advance Directive Healthcare Power of Attorney, Living will   Pre-existing out of facility DNR order (yellow form or pink MOST form) --  MOST Form in Place? --    No orders of the defined types were placed in this encounter.    Future Appointments  Date Time Provider Department Center  01/28/2024  8:15 AM [facility] INFUSION [facility] None  02/01/2024 10:30 AM [facility] FLUSH [facility] None  02/12/2024  8:30 AM [facility] FLUSH [facility] None  02/12/2024  9:00 AM Boscia, Heather E, NP [facility] None  02/12/2024  9:30 AM [facility] INFUSION [facility] None  02/19/2024  9:00 AM [facility] FLUSH [facility] None  02/19/2024  9:20 AM Thayil, Johnston T, PA-C [facility] None  02/19/2024 10:00 AM [facility] INFUSION [facility] None  03/04/2024  8:30 AM Dewald, Dorn NOVAK, MD [facility] None      This document was completed utilizing speech recognition software. Grammatical errors, random word insertions, pronoun errors, and incomplete sentences are an occasional consequence of this system due to software limitations, ambient noise, and hardware issues. Any formal questions or concerns about the content, text or information contained within the body of this dictation should be directly addressed to the provider for clarification.

## 2024-01-28 ENCOUNTER — Inpatient Hospital Stay

## 2024-01-28 ENCOUNTER — Telehealth: Payer: Self-pay | Admitting: Oncology

## 2024-01-28 VITALS — BP 129/75 | HR 69 | Temp 97.6°F | Resp 18

## 2024-01-28 DIAGNOSIS — Z5112 Encounter for antineoplastic immunotherapy: Secondary | ICD-10-CM | POA: Diagnosis not present

## 2024-01-28 DIAGNOSIS — C23 Malignant neoplasm of gallbladder: Secondary | ICD-10-CM

## 2024-01-28 MED ORDER — SODIUM CHLORIDE 0.9% FLUSH
10.0000 mL | INTRAVENOUS | Status: DC | PRN
Start: 2024-01-28 — End: 2024-01-28

## 2024-01-28 MED ORDER — PALONOSETRON HCL INJECTION 0.25 MG/5ML
0.2500 mg | Freq: Once | INTRAVENOUS | Status: AC
  Administered 2024-01-28: 0.25 mg via INTRAVENOUS
  Filled 2024-01-28: qty 5

## 2024-01-28 MED ORDER — APREPITANT 130 MG/18ML IV EMUL
130.0000 mg | Freq: Once | INTRAVENOUS | Status: AC
  Administered 2024-01-28: 130 mg via INTRAVENOUS
  Filled 2024-01-28: qty 18

## 2024-01-28 MED ORDER — POTASSIUM CHLORIDE IN NACL 20-0.9 MEQ/L-% IV SOLN
Freq: Once | INTRAVENOUS | Status: AC
  Filled 2024-01-28: qty 1000

## 2024-01-28 MED ORDER — SODIUM CHLORIDE 0.9 % IV SOLN
1000.0000 mg/m2 | Freq: Once | INTRAVENOUS | Status: AC
  Administered 2024-01-28: 1786 mg via INTRAVENOUS
  Filled 2024-01-28: qty 46.97

## 2024-01-28 MED ORDER — SODIUM CHLORIDE 0.9 % IV SOLN
25.0000 mg/m2 | Freq: Once | INTRAVENOUS | Status: AC
  Administered 2024-01-28: 50 mg via INTRAVENOUS
  Filled 2024-01-28: qty 50

## 2024-01-28 MED ORDER — MAGNESIUM SULFATE 2 GM/50ML IV SOLN
2.0000 g | Freq: Once | INTRAVENOUS | Status: AC
  Administered 2024-01-28: 2 g via INTRAVENOUS
  Filled 2024-01-28: qty 50

## 2024-01-28 MED ORDER — HEPARIN SOD (PORK) LOCK FLUSH 100 UNIT/ML IV SOLN
500.0000 [IU] | Freq: Once | INTRAVENOUS | Status: DC | PRN
Start: 2024-01-28 — End: 2024-01-28

## 2024-01-28 MED ORDER — DEXAMETHASONE SODIUM PHOSPHATE 10 MG/ML IJ SOLN
10.0000 mg | Freq: Once | INTRAMUSCULAR | Status: AC
  Administered 2024-01-28: 10 mg via INTRAVENOUS
  Filled 2024-01-28: qty 1

## 2024-01-28 MED ORDER — SODIUM CHLORIDE 0.9 % IV SOLN: INTRAVENOUS | Status: DC

## 2024-01-28 MED ORDER — SODIUM CHLORIDE 0.9 % IV SOLN
INTRAVENOUS | Status: DC
Start: 2024-01-28 — End: 2024-01-28

## 2024-01-28 NOTE — Progress Notes (Signed)
 Per Pasam MD, ok to proceed with TX today with 100cc urine output prior to chemo.

## 2024-01-28 NOTE — Telephone Encounter (Signed)
 I left Janice Pace a voicemail with the update to her schedule for 7/31. Her appointments have been de coupled per her request.

## 2024-01-28 NOTE — Patient Instructions (Signed)
 [facility] - A DEPT OF [facility]  Discharge Instructions: Thank you for choosing [facility] to provide your oncology and hematology care.   If you have a lab appointment with the Cancer Center, please go directly to the Cancer Center and check in at the registration area.   Wear comfortable clothing and clothing appropriate for easy access to any Portacath or PICC line.   We strive to give you quality time with your provider. You may need to reschedule your appointment if you arrive late (15 or more minutes).  Arriving late affects you and other patients whose appointments are after yours.  Also, if you miss three or more appointments without notifying the office, you may be dismissed from the clinic at the provider's discretion.      For prescription refill requests, have your pharmacy contact our office and allow 72 hours for refills to be completed.    Today you received the following chemotherapy and/or immunotherapy agents: Gemcitabine, Cisplatin      To help prevent nausea and vomiting after your treatment, we encourage you to take your nausea medication as directed.  BELOW ARE SYMPTOMS THAT SHOULD BE REPORTED IMMEDIATELY: *FEVER GREATER THAN 100.4 F (38 C) OR HIGHER *CHILLS OR SWEATING *NAUSEA AND VOMITING THAT IS NOT CONTROLLED WITH YOUR NAUSEA MEDICATION *UNUSUAL SHORTNESS OF BREATH *UNUSUAL BRUISING OR BLEEDING *URINARY PROBLEMS (pain or burning when urinating, or frequent urination) *BOWEL PROBLEMS (unusual diarrhea, constipation, pain near the anus) TENDERNESS IN MOUTH AND THROAT WITH OR WITHOUT PRESENCE OF ULCERS (sore throat, sores in mouth, or a toothache) UNUSUAL RASH, SWELLING OR PAIN  UNUSUAL VAGINAL DISCHARGE OR ITCHING   Items with * indicate a potential emergency and should be followed up as soon as possible or go to the Emergency Department if any problems should occur.  Please show the CHEMOTHERAPY ALERT CARD or  IMMUNOTHERAPY ALERT CARD at check-in to the Emergency Department and triage nurse.  Should you have questions after your visit or need to cancel or reschedule your appointment, please contact [facility] - [facility]  Dept: [phone]  and follow the prompts.  Office hours are 8:00 a.m. to 4:30 p.m. Monday - Friday. Please note that voicemails left after 4:00 p.m. may not be returned until the following business day.  We are closed weekends and major holidays. You have access to a nurse at all times for urgent questions. Please call the main number to the clinic Dept: [phone] and follow the prompts.   For any non-urgent questions, you may also contact your provider using MyChart. We now offer e-Visits for anyone 23 and older to request care online for non-urgent symptoms. For details visit [email or web address].   Also download the MyChart app! Go to the app store, search "MyChart", open the app, select [place], and log in with your MyChart username and password.

## 2024-01-29 ENCOUNTER — Other Ambulatory Visit: Payer: Self-pay

## 2024-02-01 ENCOUNTER — Inpatient Hospital Stay

## 2024-02-01 VITALS — BP 116/51 | HR 75 | Temp 98.3°F | Resp 16

## 2024-02-01 DIAGNOSIS — Z5112 Encounter for antineoplastic immunotherapy: Secondary | ICD-10-CM | POA: Diagnosis not present

## 2024-02-01 DIAGNOSIS — C23 Malignant neoplasm of gallbladder: Secondary | ICD-10-CM

## 2024-02-01 MED ORDER — PEGFILGRASTIM-CBQV 6 MG/0.6ML ~~LOC~~ SOSY
6.0000 mg | PREFILLED_SYRINGE | Freq: Once | SUBCUTANEOUS | Status: AC
  Administered 2024-02-01: 6 mg via SUBCUTANEOUS
  Filled 2024-02-01: qty 0.6

## 2024-02-02 ENCOUNTER — Other Ambulatory Visit: Payer: Self-pay

## 2024-02-03 ENCOUNTER — Other Ambulatory Visit: Payer: Self-pay | Admitting: Oncology

## 2024-02-03 ENCOUNTER — Other Ambulatory Visit (HOSPITAL_COMMUNITY): Payer: Self-pay

## 2024-02-03 DIAGNOSIS — G893 Neoplasm related pain (acute) (chronic): Secondary | ICD-10-CM

## 2024-02-04 ENCOUNTER — Other Ambulatory Visit (HOSPITAL_COMMUNITY): Payer: Self-pay

## 2024-02-04 ENCOUNTER — Encounter: Payer: Self-pay | Admitting: Oncology

## 2024-02-04 MED ORDER — MORPHINE SULFATE ER 15 MG PO TBCR
15.0000 mg | EXTENDED_RELEASE_TABLET | Freq: Two times a day (BID) | ORAL | 0 refills | Status: DC
  Filled 2024-02-04: qty 60, 30d supply, fill #0

## 2024-02-05 ENCOUNTER — Other Ambulatory Visit: Payer: Self-pay | Admitting: Pulmonary Disease

## 2024-02-10 NOTE — Progress Notes (Signed)
 Patient Care Team: [name] as PCP - General (Physician Assistant) Pcp, [name], DO (Family Medicine) [place], [facility] [name], RN as Oncology Nurse Navigator  Clinic Day:  02/11/2024  Referring physician: Autumn Millman, MD  ASSESSMENT & PLAN:   Assessment & Plan: Adenocarcinoma of gallbladder [facility]) Please review oncology history for additional details and timeline of events.  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 Biopsy from the omentum confirmed adenocarcinoma, originating from the gallbladder.  Immunostains support diagnosis of gallbladder carcinoma.   Previously, her diagnosis, staging, prognosis, plan of care, treatment options were discussed. Reviewed NCCN guidelines.  Given stage IV disease, all treatment options are palliative in nature and not curative in intent.  This was discussed with patient, her daughter and son-in-law who were accompanying.  They verbalized understanding.   Discussed the aggressive nature of stage IV gallbladder adenocarcinoma and overall poor prognosis.  Life expectancy with treatment is 1 to 1.5 years; without treatment, less than six months.  We submitted NGS panel testing on the specimen and also liquid biopsy for mutation analysis.  Staging PET scan on 12/29/2023 showed known gallbladder mass extending into the liver.  Peritoneal carcinomatosis noted.  Numerous lung nodules were identified, small bowel with FDG uptake, concerning for metastatic disease in the lungs.  Metastatic lymphadenopathy was noted in the abdomen and retroperitoneum.  This scan will serve as a good baseline for future compression.  Discussed results with the patient and family members previously.   Plan made to proceed with palliative systemic chemoimmunotherapy with gemcitabine , cisplatin  and durvalumab .  She started this from 01/01/2024.  Tolerating chemo well so far.  No major side effects.  Labs  today reveal no dose-limiting toxicities.  Will proceed with cycle 2-day 8 tomorrow as scheduled.  Will proceed with G-CSF support with this cycle.   -02/11/2024 -persistent leukocytosis with WBC 19.5 and ANC 15.9.  Will hold G-CSF support with this cycle. - Ensure hydration and nutrition are maintained during treatment. -- Cycle 3 day 8 scheduled for 02/19/2024.  Recheck labs.  Add back G-CSF as indicated.  Dose adjustments to be made as indicated.   Fatigue Patient experienced increased fatigue after cycle #2 chemotherapy with cisplatin , gemcitabine , and durvalumab .  She feels like treatment was given much faster and that really caused her increased fatigue.  Would like to make sure cycle #3 chemotherapy scheduled for tomorrow, 02/12/2024, go slower at the previous cycle date.  Hypomagnesemia Magnesium  level 1.5 on today's labs.  Will add additional 2 g magnesium  during treatment tomorrow, 02/12/2024.  Continue to check with each visit, prior to chemo, and replace as indicated.  Anemia Mild and stable anemia with Hgb 8.9 and HCT 25.9.  May be contributing to increased fatigue.  Will continue to monitor levels very closely.  She has started oral iron supplementation to improve these levels.  Leukocytosis Today, WBC is 19.5 and ANC 15.9.  Will hold G-CSF support for this cycle (cycle 3 day 1) chemotherapy.  Recheck labs at next scheduled visit on 02/18/2024.  Add back G-CSF as indicated.  Plan Labs reviewed. - Stable and mild anemia with Hgb 8.9 and HCT 25.9.  Leukocytosis with WBC 19.5 and ANC 15.9.  Will hold G-CSF support for cycle 3 day 1 chemotherapy. - CMP unremarkable. - Magnesium  level decreased at 1.5.  Will add additional 2 g magnesium  with tomorrow's chemotherapy treatment. - Thyroid  panel pending. Labs and patient presentation are appropriate for treatment  on 02/12/2024.  Okay to proceed with cycle #3 cisplatin  and gemcitabine . Labs/flush, follow-up, and cycle #4 in 2 weeks as  scheduled.  The patient understands the plans discussed today and is in agreement with them.  She knows to contact our office if she develops concerns prior to her next appointment.  I provided 25 minutes of face-to-face time during this encounter and > 50% was spent counseling as documented under my assessment and plan.    Powell FORBES Lessen, NP  [facility] - A DEPT OF [facility] [address] [place] [address] Dept: [phone] Dept Fax: [phone]   No orders of the defined types were placed in this encounter.     CHIEF COMPLAINT:  CC: Adenocarcinoma of gallbladder  Current Treatment: Palliative chemoimmunotherapy with cisplatin , gemcitabine , and durvalumab   INTERVAL HISTORY:  Chirstina is here today for repeat clinical assessment. She last saw Dr. Autumn 01/27/2024.  She reports feeling much more fatigued after cycle #2.  Feels like this was related to increased speed of chemotherapy treatment.  She would like to make sure chemotherapy is administered or slowly to prevent fatigue.  She has had no nausea.  She is using a motion sickness watch to help prevent postchemotherapy nausea.  Thus far, she feels like it is working well.  She has had some intermittent episodes of tingling in right flank area which comes and goes.  She denies chest pain, chest pressure, or shortness of breath. She denies headaches or visual disturbances. She denies abdominal pain, nausea, vomiting, or changes in bowel or bladder habits.  She denies fevers or chills. She denies pain. Her appetite is good. Her weight has increased 10 pounds over last 2 weeks.  I have reviewed the past medical history, past surgical history, social history and family history with the patient and they are unchanged from previous note.  ALLERGIES:  is allergic to sulfa antibiotics, forteo  [parathyroid hormone (recomb)], atorvastatin, and metformin hcl.  MEDICATIONS:   Current Outpatient Medications  Medication Sig Dispense Refill   albuterol  (VENTOLIN  HFA) 108 (90 Base) MCG/ACT inhaler Inhale 1-2 puffs into the lungs every 6 (six) hours as needed for wheezing or shortness of breath. 8 g 1   aspirin  81 MG EC tablet Take 81 mg by mouth daily.     budesonide  (PULMICORT ) 0.5 MG/2ML nebulizer solution Generic for Pulmicort . Inhale one vial in nebulizer twice a day. Rinse mouth after use. 60 mL 11   dexamethasone  (DECADRON ) 4 MG tablet Take 2 tablets (8 mg) by mouth daily x 3 days starting the day after cisplatin  chemotherapy. Take with food. 30 tablet 1   docusate sodium  (COLACE) 100 MG capsule Take 500 mg by mouth at bedtime.     formoterol  (PERFOROMIST ) 20 MCG/2ML nebulizer solution Substituted for: Perforomist  Solution Inhale one vial in nebulizer twice a day. 60 mL 11   lidocaine -prilocaine  (EMLA ) cream Apply to affected area once 30 g 3   megestrol  (MEGACE  ES) 625 MG/5ML suspension Take 5 mLs (625 mg total) by mouth daily. 150 mL 3   metoprolol  succinate (TOPROL -XL) 50 MG 24 hr tablet TAKE 1 TABLET BY MOUTH ONCE DAILY. TAKE WITH OR IMMEDIATELY FOLLOWING A MEAL 90 tablet 1   morphine  (MS CONTIN ) 15 MG 12 hr tablet Take 1 tablet (15 mg total) by mouth every 12 (twelve) hours. 60 tablet 0   omeprazole  (PRILOSEC) 20 MG capsule Take 1 capsule (20 mg total) by mouth  2 (two) times daily before a meal. 60 capsule 3   ondansetron  (ZOFRAN ) 4 MG tablet Take 1 tablet (4 mg total) by mouth every 6 (six) hours as needed for nausea. 30 tablet 0   ondansetron  (ZOFRAN ) 8 MG tablet Take 1 tablet (8 mg total) by mouth every 8 (eight) hours as needed for nausea or vomiting. Start on the third day after cisplatin . 30 tablet 1   oxyCODONE  10 MG TABS Take 1-1.5 tablets (10-15 mg total) by mouth every 4 (four) hours as needed for moderate pain (pain score 4-6) or severe pain (pain score 7-10). (Patient taking differently: Take 10-15 mg by mouth every 4 (four) hours as needed for  moderate pain (pain score 4-6) or severe pain (pain score 7-10). Pt reports she takes this once in awhile) 60 tablet 0   polyethylene glycol (MIRALAX  / GLYCOLAX ) 17 g packet Take 17 g by mouth 2 (two) times daily.     prochlorperazine  (COMPAZINE ) 10 MG tablet Take 1 tablet (10 mg total) by mouth every 6 (six) hours as needed (Nausea or vomiting). 30 tablet 1   revefenacin  (YUPELRI ) 175 MCG/3ML nebulizer solution Inhale one vial in nebulizer once daily. Do not mix with other nebulized medications. 30 mL 11   rosuvastatin  (CRESTOR ) 10 MG tablet Take 1 tablet (10 mg total) by mouth daily. 90 tablet 1   spironolactone  (ALDACTONE ) 25 MG tablet Take 25 mg by mouth daily.     No current facility-administered medications for this visit.    HISTORY OF PRESENT ILLNESS:   Oncology History  Adenocarcinoma of gallbladder (HCC)  12/24/2023 Initial Diagnosis   Adenocarcinoma of gallbladder (HCC)   12/24/2023 Cancer Staging   Staging form: Gallbladder, AJCC 8th Edition - Clinical: Stage IVB (cTX, cNX, pM1) - Signed by Autumn Millman, MD on 12/24/2023 Histologic grade (G): G2 Histologic grading system: 3 grade system   01/01/2024 -  Chemotherapy   Patient is on Treatment Plan : BILIARY TRACT Cisplatin  + Gemcitabine  D1,8 + Durvalumab  (1500) D1 q21d / Durvalumab  (1500) q28d         REVIEW OF SYSTEMS:   Constitutional: Denies fevers, chills or abnormal weight loss. Fatigue  Eyes: Denies blurriness of vision Ears, nose, mouth, throat, and face: Denies mucositis or sore throat Respiratory: Denies cough, dyspnea or wheezes Cardiovascular: Denies palpitation, chest discomfort or lower extremity swelling Gastrointestinal:  Denies nausea, heartburn or change in bowel habits. Does have some constipation since starting chemotherapy.  She uses MiraLAX  daily to control the symptoms. Skin: Denies abnormal skin rashes Lymphatics: Denies new lymphadenopathy or easy bruising Neurological:Denies numbness, tingling  or new weaknesses Behavioral/Psych: Mood is stable, no new changes  All other systems were reviewed with the patient and are negative.   VITALS:   Today's Vitals   02/11/24 1307  BP: 138/68  Pulse: 70  Resp: 17  Temp: 97.8 F (36.6 C)  SpO2: 99%  Weight: 166 lb 12.8 oz (75.7 kg)   Body mass index is 30.51 kg/m.   Wt Readings from Last 3 Encounters:  02/11/24 166 lb 12.8 oz (75.7 kg)  01/27/24 156 lb 1.6 oz (70.8 kg)  01/21/24 162 lb (73.5 kg)    Body mass index is 30.51 kg/m.  Performance status (ECOG): 1 - Symptomatic but completely ambulatory  PHYSICAL EXAM:   GENERAL:alert, no distress and comfortable SKIN: skin color, texture, turgor are normal, no rashes or significant lesions EYES: normal, Conjunctiva are pink and non-injected, sclera clear OROPHARYNX:no exudate, no erythema and lips, buccal  mucosa, and tongue normal  NECK: supple, thyroid  normal size, non-tender, without nodularity LYMPH:  no palpable lymphadenopathy in the cervical, axillary or inguinal LUNGS: clear to auscultation and percussion with normal breathing effort HEART: regular rate & rhythm and no murmurs and no lower extremity edema ABDOMEN:abdomen soft, non-tender and normal bowel sounds Musculoskeletal:no cyanosis of digits and no clubbing  NEURO: alert & oriented x 3 with fluent speech, no focal motor/sensory deficits  LABORATORY DATA:  I have reviewed the data as listed    Component Value Date/Time   NA 141 02/11/2024 1252   NA 140 12/22/2023 0938   K 3.7 02/11/2024 1252   CL 110 02/11/2024 1252   CO2 25 02/11/2024 1252   GLUCOSE 101 (H) 02/11/2024 1252   BUN 16 02/11/2024 1252   BUN 10 12/22/2023 0938   CREATININE 0.74 02/11/2024 1252   CALCIUM  8.4 (L) 02/11/2024 1252   PROT 5.9 (L) 02/11/2024 1252   PROT 7.2 12/22/2023 0938   ALBUMIN  3.4 (L) 02/11/2024 1252   ALBUMIN  4.7 12/22/2023 0938   AST 14 (L) 02/11/2024 1252   ALT 11 02/11/2024 1252   ALKPHOS 86 02/11/2024 1252    BILITOT 0.3 02/11/2024 1252   GFRNONAA >60 02/11/2024 1252   GFRAA 100 04/24/2020 1600    Lab Results  Component Value Date   WBC 19.5 (H) 02/11/2024   NEUTROABS 15.9 (H) 02/11/2024   HGB 8.9 (L) 02/11/2024   HCT 25.9 (L) 02/11/2024   MCV 89.3 02/11/2024   PLT 168 02/11/2024

## 2024-02-10 NOTE — Assessment & Plan Note (Addendum)
 Please review oncology history for additional details and timeline of events.  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 Biopsy from the omentum confirmed adenocarcinoma, originating from the gallbladder.  Immunostains support diagnosis of gallbladder carcinoma.   Previously, her diagnosis, staging, prognosis, plan of care, treatment options were discussed. Reviewed NCCN guidelines.  Given stage IV disease, all treatment options are palliative in nature and not curative in intent.  This was discussed with patient, her daughter and son-in-law who were accompanying.  They verbalized understanding.   Discussed the aggressive nature of stage IV gallbladder adenocarcinoma and overall poor prognosis.  Life expectancy with treatment is 1 to 1.5 years; without treatment, less than six months.  We submitted NGS panel testing on the specimen and also liquid biopsy for mutation analysis.  Staging PET scan on 12/29/2023 showed known gallbladder mass extending into the liver.  Peritoneal carcinomatosis noted.  Numerous lung nodules were identified, small bowel with FDG uptake, concerning for metastatic disease in the lungs.  Metastatic lymphadenopathy was noted in the abdomen and retroperitoneum.  This scan will serve as a good baseline for future compression.  Discussed results with the patient and family members previously.   Plan made to proceed with palliative systemic chemoimmunotherapy with gemcitabine , cisplatin  and durvalumab .  She started this from 01/01/2024.  Tolerating chemo well so far.  No major side effects.  Labs today reveal no dose-limiting toxicities.  Will proceed with cycle 2-day 8 tomorrow as scheduled.  Will proceed with G-CSF support with this cycle.   -02/11/2024 -persistent leukocytosis with WBC 19.5 and ANC 15.9.  Will hold G-CSF support with this cycle. - Ensure hydration and nutrition are maintained during treatment. -- Cycle 3 day 8 scheduled for 02/19/2024.   Recheck labs.  Add back G-CSF as indicated.  Dose adjustments to be made as indicated.

## 2024-02-11 ENCOUNTER — Inpatient Hospital Stay (HOSPITAL_BASED_OUTPATIENT_CLINIC_OR_DEPARTMENT_OTHER): Admitting: Nurse Practitioner

## 2024-02-11 ENCOUNTER — Inpatient Hospital Stay

## 2024-02-11 ENCOUNTER — Other Ambulatory Visit

## 2024-02-11 ENCOUNTER — Ambulatory Visit: Admitting: Nurse Practitioner

## 2024-02-11 VITALS — BP 138/68 | HR 70 | Temp 97.8°F | Resp 17 | Wt 166.8 lb

## 2024-02-11 DIAGNOSIS — Z5112 Encounter for antineoplastic immunotherapy: Secondary | ICD-10-CM | POA: Diagnosis not present

## 2024-02-11 DIAGNOSIS — C23 Malignant neoplasm of gallbladder: Secondary | ICD-10-CM | POA: Diagnosis not present

## 2024-02-11 DIAGNOSIS — Z95828 Presence of other vascular implants and grafts: Secondary | ICD-10-CM | POA: Insufficient documentation

## 2024-02-11 LAB — CMP (CANCER CENTER ONLY)
ALT: 11 U/L (ref 0–44)
AST: 14 U/L — ABNORMAL LOW (ref 15–41)
Albumin: 3.4 g/dL — ABNORMAL LOW (ref 3.5–5.0)
Alkaline Phosphatase: 86 U/L (ref 38–126)
Anion gap: 6 (ref 5–15)
BUN: 16 mg/dL (ref 8–23)
CO2: 25 mmol/L (ref 22–32)
Calcium: 8.4 mg/dL — ABNORMAL LOW (ref 8.9–10.3)
Chloride: 110 mmol/L (ref 98–111)
Creatinine: 0.74 mg/dL (ref 0.44–1.00)
GFR, Estimated: >60 mL/min (ref >60)
Glucose, Bld: 101 mg/dL — ABNORMAL HIGH (ref 70–99)
Potassium: 3.7 mmol/L (ref 3.5–5.1)
Sodium: 141 mmol/L (ref 135–145)
Total Bilirubin: 0.3 mg/dL (ref 0.0–1.2)
Total Protein: 5.9 g/dL — ABNORMAL LOW (ref 6.5–8.1)

## 2024-02-11 LAB — CBC WITH DIFFERENTIAL (CANCER CENTER ONLY)
Abs Immature Granulocytes: 0.45 K/uL — ABNORMAL HIGH (ref 0.00–0.07)
Basophils Absolute: 0.1 K/uL (ref 0.0–0.1)
Basophils Relative: 0 %
Eosinophils Absolute: 0.1 K/uL (ref 0.0–0.5)
Eosinophils Relative: 0 %
HCT: 25.9 % — ABNORMAL LOW (ref 36.0–46.0)
Hemoglobin: 8.9 g/dL — ABNORMAL LOW (ref 12.0–15.0)
Immature Granulocytes: 2 %
Lymphocytes Relative: 8 %
Lymphs Abs: 1.6 K/uL (ref 0.7–4.0)
MCH: 30.7 pg (ref 26.0–34.0)
MCHC: 34.4 g/dL (ref 30.0–36.0)
MCV: 89.3 fL (ref 80.0–100.0)
Monocytes Absolute: 1.4 K/uL — ABNORMAL HIGH (ref 0.1–1.0)
Monocytes Relative: 7 %
Neutro Abs: 15.9 K/uL — ABNORMAL HIGH (ref 1.7–7.7)
Neutrophils Relative %: 83 %
Platelet Count: 168 K/uL (ref 150–400)
RBC: 2.9 MIL/uL — ABNORMAL LOW (ref 3.87–5.11)
RDW: 18.1 % — ABNORMAL HIGH (ref 11.5–15.5)
WBC Count: 19.5 K/uL — ABNORMAL HIGH (ref 4.0–10.5)
nRBC: 0 % (ref 0.0–0.2)

## 2024-02-11 LAB — MAGNESIUM: Magnesium: 1.5 mg/dL — ABNORMAL LOW (ref 1.7–2.4)

## 2024-02-11 LAB — TSH: TSH: 1.36 u[IU]/mL (ref 0.350–4.500)

## 2024-02-11 MED ORDER — SODIUM CHLORIDE 0.9% FLUSH
10.0000 mL | Freq: Once | INTRAVENOUS | Status: AC
  Administered 2024-02-11: 10 mL

## 2024-02-11 MED ORDER — HEPARIN SOD (PORK) LOCK FLUSH 100 UNIT/ML IV SOLN
500.0000 [IU] | Freq: Once | INTRAVENOUS | Status: AC
Start: 2024-02-11 — End: 2024-02-11
  Administered 2024-02-11: 500 [IU]

## 2024-02-12 ENCOUNTER — Inpatient Hospital Stay: Admitting: Dietician

## 2024-02-12 ENCOUNTER — Other Ambulatory Visit

## 2024-02-12 ENCOUNTER — Ambulatory Visit: Admitting: Nurse Practitioner

## 2024-02-12 ENCOUNTER — Inpatient Hospital Stay: Attending: Oncology

## 2024-02-12 VITALS — BP 143/59 | HR 70 | Temp 98.6°F | Resp 16

## 2024-02-12 DIAGNOSIS — D6481 Anemia due to antineoplastic chemotherapy: Secondary | ICD-10-CM | POA: Diagnosis not present

## 2024-02-12 DIAGNOSIS — K219 Gastro-esophageal reflux disease without esophagitis: Secondary | ICD-10-CM | POA: Diagnosis not present

## 2024-02-12 DIAGNOSIS — T451X5A Adverse effect of antineoplastic and immunosuppressive drugs, initial encounter: Secondary | ICD-10-CM | POA: Diagnosis not present

## 2024-02-12 DIAGNOSIS — C7801 Secondary malignant neoplasm of right lung: Secondary | ICD-10-CM | POA: Diagnosis not present

## 2024-02-12 DIAGNOSIS — K828 Other specified diseases of gallbladder: Secondary | ICD-10-CM | POA: Insufficient documentation

## 2024-02-12 DIAGNOSIS — Z882 Allergy status to sulfonamides status: Secondary | ICD-10-CM | POA: Diagnosis not present

## 2024-02-12 DIAGNOSIS — I10 Essential (primary) hypertension: Secondary | ICD-10-CM | POA: Diagnosis not present

## 2024-02-12 DIAGNOSIS — G8929 Other chronic pain: Secondary | ICD-10-CM | POA: Insufficient documentation

## 2024-02-12 DIAGNOSIS — C779 Secondary and unspecified malignant neoplasm of lymph node, unspecified: Secondary | ICD-10-CM | POA: Insufficient documentation

## 2024-02-12 DIAGNOSIS — Z79630 Long term (current) use of alkylating agent: Secondary | ICD-10-CM | POA: Diagnosis not present

## 2024-02-12 DIAGNOSIS — Z7982 Long term (current) use of aspirin: Secondary | ICD-10-CM | POA: Diagnosis not present

## 2024-02-12 DIAGNOSIS — D72829 Elevated white blood cell count, unspecified: Secondary | ICD-10-CM | POA: Insufficient documentation

## 2024-02-12 DIAGNOSIS — Z79899 Other long term (current) drug therapy: Secondary | ICD-10-CM | POA: Diagnosis not present

## 2024-02-12 DIAGNOSIS — C7802 Secondary malignant neoplasm of left lung: Secondary | ICD-10-CM | POA: Diagnosis not present

## 2024-02-12 DIAGNOSIS — Z5111 Encounter for antineoplastic chemotherapy: Secondary | ICD-10-CM | POA: Insufficient documentation

## 2024-02-12 DIAGNOSIS — C23 Malignant neoplasm of gallbladder: Secondary | ICD-10-CM | POA: Insufficient documentation

## 2024-02-12 DIAGNOSIS — M1990 Unspecified osteoarthritis, unspecified site: Secondary | ICD-10-CM | POA: Diagnosis not present

## 2024-02-12 DIAGNOSIS — Z79620 Long term (current) use of immunosuppressive biologic: Secondary | ICD-10-CM | POA: Insufficient documentation

## 2024-02-12 DIAGNOSIS — E876 Hypokalemia: Secondary | ICD-10-CM | POA: Insufficient documentation

## 2024-02-12 DIAGNOSIS — C786 Secondary malignant neoplasm of retroperitoneum and peritoneum: Secondary | ICD-10-CM | POA: Diagnosis not present

## 2024-02-12 DIAGNOSIS — J4489 Other specified chronic obstructive pulmonary disease: Secondary | ICD-10-CM | POA: Diagnosis not present

## 2024-02-12 DIAGNOSIS — Z5189 Encounter for other specified aftercare: Secondary | ICD-10-CM | POA: Diagnosis not present

## 2024-02-12 DIAGNOSIS — Z5112 Encounter for antineoplastic immunotherapy: Secondary | ICD-10-CM | POA: Diagnosis present

## 2024-02-12 DIAGNOSIS — Z79631 Long term (current) use of antimetabolite agent: Secondary | ICD-10-CM | POA: Insufficient documentation

## 2024-02-12 LAB — T4: T4, Total: 11.8 ug/dL (ref 4.5–12.0)

## 2024-02-12 MED ORDER — SODIUM CHLORIDE 0.9 % IV SOLN: INTRAVENOUS | Status: DC

## 2024-02-12 MED ORDER — POTASSIUM CHLORIDE IN NACL 20-0.9 MEQ/L-% IV SOLN
Freq: Once | INTRAVENOUS | Status: AC
  Filled 2024-02-12: qty 1000

## 2024-02-12 MED ORDER — DEXAMETHASONE SODIUM PHOSPHATE 10 MG/ML IJ SOLN
10.0000 mg | Freq: Once | INTRAMUSCULAR | Status: AC
  Administered 2024-02-12: 10 mg via INTRAVENOUS
  Filled 2024-02-12: qty 1

## 2024-02-12 MED ORDER — APREPITANT 130 MG/18ML IV EMUL
130.0000 mg | Freq: Once | INTRAVENOUS | Status: AC
  Administered 2024-02-12: 130 mg via INTRAVENOUS
  Filled 2024-02-12: qty 18

## 2024-02-12 MED ORDER — MAGNESIUM SULFATE 2 GM/50ML IV SOLN
2.0000 g | Freq: Once | INTRAVENOUS | Status: AC
Start: 2024-02-12 — End: 2024-02-12
  Administered 2024-02-12: 2 g via INTRAVENOUS
  Filled 2024-02-12: qty 50

## 2024-02-12 MED ORDER — HEPARIN SOD (PORK) LOCK FLUSH 100 UNIT/ML IV SOLN: 500.0000 [IU] | Freq: Once | INTRAVENOUS | Status: DC | PRN

## 2024-02-12 MED ORDER — SODIUM CHLORIDE 0.9 % IV SOLN
25.0000 mg/m2 | Freq: Once | INTRAVENOUS | Status: AC
  Administered 2024-02-12: 50 mg via INTRAVENOUS
  Filled 2024-02-12: qty 50

## 2024-02-12 MED ORDER — SODIUM CHLORIDE 0.9 % IV SOLN
1000.0000 mg/m2 | Freq: Once | INTRAVENOUS | Status: AC
  Administered 2024-02-12: 1786 mg via INTRAVENOUS
  Filled 2024-02-12: qty 46.97

## 2024-02-12 MED ORDER — SODIUM CHLORIDE 0.9 % IV SOLN
1500.0000 mg | Freq: Once | INTRAVENOUS | Status: AC
  Administered 2024-02-12: 1500 mg via INTRAVENOUS
  Filled 2024-02-12: qty 30

## 2024-02-12 MED ORDER — SODIUM CHLORIDE 0.9% FLUSH
10.0000 mL | INTRAVENOUS | Status: DC | PRN
  Administered 2024-02-12: 10 mL

## 2024-02-12 MED ORDER — PALONOSETRON HCL INJECTION 0.25 MG/5ML
0.2500 mg | Freq: Once | INTRAVENOUS | Status: AC
  Administered 2024-02-12: 0.25 mg via INTRAVENOUS
  Filled 2024-02-12: qty 5

## 2024-02-12 MED ORDER — MAGNESIUM SULFATE 2 GM/50ML IV SOLN
2.0000 g | Freq: Once | INTRAVENOUS | Status: AC
  Administered 2024-02-12: 2 g via INTRAVENOUS
  Filled 2024-02-12: qty 50

## 2024-02-12 NOTE — Patient Instructions (Signed)
 [facility] - A DEPT OF [facility]  Discharge Instructions: Thank you for choosing [facility] to provide your oncology and hematology care.   If you have a lab appointment with the Cancer Center, please go directly to the Cancer Center and check in at the registration area.   Wear comfortable clothing and clothing appropriate for easy access to any Portacath or PICC line.   We strive to give you quality time with your provider. You may need to reschedule your appointment if you arrive late (15 or more minutes).  Arriving late affects you and other patients whose appointments are after yours.  Also, if you miss three or more appointments without notifying the office, you may be dismissed from the clinic at the provider's discretion.      For prescription refill requests, have your pharmacy contact our office and allow 72 hours for refills to be completed.    Today you received the following chemotherapy and/or immunotherapy agents: Durvalumab  (Imfinzi ), Gemcitabine  (Gemzar ) and Cisplatin .   To help prevent nausea and vomiting after your treatment, we encourage you to take your nausea medication as directed.  BELOW ARE SYMPTOMS THAT SHOULD BE REPORTED IMMEDIATELY: *FEVER GREATER THAN 100.4 F (38 C) OR HIGHER *CHILLS OR SWEATING *NAUSEA AND VOMITING THAT IS NOT CONTROLLED WITH YOUR NAUSEA MEDICATION *UNUSUAL SHORTNESS OF BREATH *UNUSUAL BRUISING OR BLEEDING *URINARY PROBLEMS (pain or burning when urinating, or frequent urination) *BOWEL PROBLEMS (unusual diarrhea, constipation, pain near the anus) TENDERNESS IN MOUTH AND THROAT WITH OR WITHOUT PRESENCE OF ULCERS (sore throat, sores in mouth, or a toothache) UNUSUAL RASH, SWELLING OR PAIN  UNUSUAL VAGINAL DISCHARGE OR ITCHING   Items with * indicate a potential emergency and should be followed up as soon as possible or go to the Emergency Department if any problems should occur.  Please show  the CHEMOTHERAPY ALERT CARD or IMMUNOTHERAPY ALERT CARD at check-in to the Emergency Department and triage nurse.  Should you have questions after your visit or need to cancel or reschedule your appointment, please contact [facility] - A DEPT OF [facility]  Dept: [phone]  and follow the prompts.  Office hours are 8:00 a.m. to 4:30 p.m. Monday - Friday. Please note that voicemails left after 4:00 p.m. may not be returned until the following business day.  We are closed weekends and major holidays. You have access to a nurse at all times for urgent questions. Please call the main number to the clinic Dept: [phone] and follow the prompts.   For any non-urgent questions, you may also contact your provider using MyChart. We now offer e-Visits for anyone 20 and older to request care online for non-urgent symptoms. For details visit [email or web address].   Also download the MyChart app! Go to the app store, search MyChart, open the app, select [facility], and log in with your MyChart username and password.

## 2024-02-12 NOTE — Progress Notes (Signed)
 Additional Mg 2g ordered per Powell Lessen, NP.  Robie Mcniel, PharmD, MBA

## 2024-02-12 NOTE — Progress Notes (Signed)
 Nutrition Follow-up:  Pt with stage IV bladder cancer mets to peritoneum and lungs. She is receiving palliative chemotherapy with cisplatin  and gemcitabine  (first 6/20). Patient is under the care of Dr. Autumn   Met with patient in infusion. She is doing well today. Appetite has improved and she has gained weight. She is taking megace  to stimulate appetite every other day. Patient reports craving [employer] chicken. Did not like this before, but could eat this everyday now. Patient denies nausea. She is taking miralax  for constipation.    Medications: reviewed  Labs: Mg 1.5, albumin  3.4  Anthropometrics: Wt 166 lb 12.8 oz on 7/31 - increased  7/16 - 156 lb 1.6 oz  7/10 - 162 lb  6/26 - 164 lb 14.4 oz 6/3 - 169 lb 15.6 oz   NUTRITION DIAGNOSIS: Food and nutrition related knowledge deficit improving     INTERVENTION:  Continue megace  for appetite per MD Encourage high calorie high protein foods for wt maintenance     MONITORING, EVALUATION, GOAL: wt trends, intake   NEXT VISIT: Friday August 22 during infusion

## 2024-02-14 ENCOUNTER — Encounter: Payer: Self-pay | Admitting: Nurse Practitioner

## 2024-02-14 ENCOUNTER — Encounter: Payer: Self-pay | Admitting: Oncology

## 2024-02-18 ENCOUNTER — Other Ambulatory Visit: Payer: Self-pay | Admitting: Family Medicine

## 2024-02-18 ENCOUNTER — Inpatient Hospital Stay

## 2024-02-18 ENCOUNTER — Inpatient Hospital Stay (HOSPITAL_BASED_OUTPATIENT_CLINIC_OR_DEPARTMENT_OTHER): Admitting: Nurse Practitioner

## 2024-02-18 ENCOUNTER — Encounter: Payer: Self-pay | Admitting: Nurse Practitioner

## 2024-02-18 VITALS — BP 134/62 | HR 69 | Temp 98.0°F | Resp 17 | Wt 164.6 lb

## 2024-02-18 DIAGNOSIS — C23 Malignant neoplasm of gallbladder: Secondary | ICD-10-CM | POA: Diagnosis not present

## 2024-02-18 DIAGNOSIS — E782 Mixed hyperlipidemia: Secondary | ICD-10-CM

## 2024-02-18 DIAGNOSIS — Z95828 Presence of other vascular implants and grafts: Secondary | ICD-10-CM

## 2024-02-18 DIAGNOSIS — Z5112 Encounter for antineoplastic immunotherapy: Secondary | ICD-10-CM | POA: Diagnosis not present

## 2024-02-18 LAB — CMP (CANCER CENTER ONLY)
ALT: 15 U/L (ref 0–44)
AST: 16 U/L (ref 15–41)
Albumin: 3.7 g/dL (ref 3.5–5.0)
Alkaline Phosphatase: 72 U/L (ref 38–126)
Anion gap: 4 — ABNORMAL LOW (ref 5–15)
BUN: 17 mg/dL (ref 8–23)
CO2: 25 mmol/L (ref 22–32)
Calcium: 8.4 mg/dL — ABNORMAL LOW (ref 8.9–10.3)
Chloride: 110 mmol/L (ref 98–111)
Creatinine: 0.75 mg/dL (ref 0.44–1.00)
GFR, Estimated: >60 mL/min (ref >60)
Glucose, Bld: 138 mg/dL — ABNORMAL HIGH (ref 70–99)
Potassium: 4 mmol/L (ref 3.5–5.1)
Sodium: 139 mmol/L (ref 135–145)
Total Bilirubin: 0.4 mg/dL (ref 0.0–1.2)
Total Protein: 6 g/dL — ABNORMAL LOW (ref 6.5–8.1)

## 2024-02-18 LAB — CBC WITH DIFFERENTIAL (CANCER CENTER ONLY)
Abs Immature Granulocytes: 0.01 K/uL (ref 0.00–0.07)
Basophils Absolute: 0 K/uL (ref 0.0–0.1)
Basophils Relative: 1 %
Eosinophils Absolute: 0 K/uL (ref 0.0–0.5)
Eosinophils Relative: 0 %
HCT: 23.5 % — ABNORMAL LOW (ref 36.0–46.0)
Hemoglobin: 8.1 g/dL — ABNORMAL LOW (ref 12.0–15.0)
Immature Granulocytes: 0 %
Lymphocytes Relative: 31 %
Lymphs Abs: 1.3 K/uL (ref 0.7–4.0)
MCH: 30.7 pg (ref 26.0–34.0)
MCHC: 34.5 g/dL (ref 30.0–36.0)
MCV: 89 fL (ref 80.0–100.0)
Monocytes Absolute: 0.2 K/uL (ref 0.1–1.0)
Monocytes Relative: 4 %
Neutro Abs: 2.8 K/uL (ref 1.7–7.7)
Neutrophils Relative %: 64 %
Platelet Count: 180 K/uL (ref 150–400)
RBC: 2.64 MIL/uL — ABNORMAL LOW (ref 3.87–5.11)
RDW: 17.2 % — ABNORMAL HIGH (ref 11.5–15.5)
WBC Count: 4.3 K/uL (ref 4.0–10.5)
nRBC: 0 % (ref 0.0–0.2)

## 2024-02-18 LAB — MAGNESIUM: Magnesium: 1.4 mg/dL — ABNORMAL LOW (ref 1.7–2.4)

## 2024-02-18 MED ORDER — SODIUM CHLORIDE 0.9% FLUSH
10.0000 mL | Freq: Once | INTRAVENOUS | Status: AC
Start: 2024-02-18 — End: 2024-02-18
  Administered 2024-02-18: 10 mL

## 2024-02-18 NOTE — Assessment & Plan Note (Addendum)
 Please review oncology history for additional details and timeline of events.  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 Biopsy from the omentum confirmed adenocarcinoma, originating from the gallbladder.  Immunostains support diagnosis of gallbladder carcinoma.   Previously, her diagnosis, staging, prognosis, plan of care, treatment options were discussed. Reviewed NCCN guidelines.  Given stage IV disease, all treatment options are palliative in nature and not curative in intent.  This was discussed with patient, her daughter and son-in-law who were accompanying.  They verbalized understanding.   Discussed the aggressive nature of stage IV gallbladder adenocarcinoma and overall poor prognosis.  Life expectancy with treatment is 1 to 1.5 years; without treatment, less than six months.  We submitted NGS panel testing on the specimen and also liquid biopsy for mutation analysis.  Staging PET scan on 12/29/2023 showed known gallbladder mass extending into the liver.  Peritoneal carcinomatosis noted.  Numerous lung nodules were identified, small bowel with FDG uptake, concerning for metastatic disease in the lungs.  Metastatic lymphadenopathy was noted in the abdomen and retroperitoneum.  This scan will serve as a good baseline for future compression.  Discussed results with the patient and family members previously.   Plan made to proceed with palliative systemic chemoimmunotherapy with gemcitabine , cisplatin  and durvalumab .  She started this from 01/01/2024.  Tolerating chemo well so far.  No major side effects.  Labs today reveal no dose-limiting toxicities.  Will proceed with cycle 2-day 8 tomorrow as scheduled.  Will proceed with G-CSF support with this cycle.   -02/11/2024 -persistent leukocytosis with WBC 19.5 and ANC 15.9.  Will hold G-CSF support with this cycle. - Ensure hydration and nutrition are maintained during treatment. -- Cycle 3 day 8 scheduled for 02/19/2024.  WBC  has dropped significantly to 4.3, ANC is 2.8.  Plan to administer G-CSF injection on 02/22/2024 as scheduled.  Mildly progressive anemia with Hgb 8.1 and HCT 23.5.  Plan to check ferritin and iron studies prior to cycle 4.  Will administer IV iron as indicated.  Magnesium  is 1.4.  She will receive 2 g IV magnesium  tomorrow, 02/19/2024.  Overall, tolerating treatment well.  Will proceed with cycle 3 day 8 on 02/19/2024.  She should return for labs, follow-up, and cycle 4 day 1 chemotherapy cisplatin  and gemcitabine , and immunotherapy durvalumab  on 03/03/2024 as scheduled.

## 2024-02-18 NOTE — Progress Notes (Signed)
 Patient Care Team: [name] as PCP - General (Physician Assistant) Pcp, [name], DO (Family Medicine) [place], [facility] [name], RN as Oncology Nurse Navigator  Clinic Day:  02/18/2024  Referring physician: Gayle Saddie JULIANNA, PA-C  ASSESSMENT & PLAN:   Assessment & Plan: Adenocarcinoma of gallbladder [facility]) Please review oncology history for additional details and timeline of events.  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 Biopsy from the omentum confirmed adenocarcinoma, originating from the gallbladder.  Immunostains support diagnosis of gallbladder carcinoma.   Previously, her diagnosis, staging, prognosis, plan of care, treatment options were discussed. Reviewed NCCN guidelines.  Given stage IV disease, all treatment options are palliative in nature and not curative in intent.  This was discussed with patient, her daughter and son-in-law who were accompanying.  They verbalized understanding.   Discussed the aggressive nature of stage IV gallbladder adenocarcinoma and overall poor prognosis.  Life expectancy with treatment is 1 to 1.5 years; without treatment, less than six months.  We submitted NGS panel testing on the specimen and also liquid biopsy for mutation analysis.  Staging PET scan on 12/29/2023 showed known gallbladder mass extending into the liver.  Peritoneal carcinomatosis noted.  Numerous lung nodules were identified, small bowel with FDG uptake, concerning for metastatic disease in the lungs.  Metastatic lymphadenopathy was noted in the abdomen and retroperitoneum.  This scan will serve as a good baseline for future compression.  Discussed results with the patient and family members previously.   Plan made to proceed with palliative systemic chemoimmunotherapy with gemcitabine , cisplatin  and durvalumab .  She started this from 01/01/2024.  Tolerating chemo well so far.  No major side effects.  Labs  today reveal no dose-limiting toxicities.  Will proceed with cycle 2-day 8 tomorrow as scheduled.  Will proceed with G-CSF support with this cycle.   -02/11/2024 -persistent leukocytosis with WBC 19.5 and ANC 15.9.  Will hold G-CSF support with this cycle. - Ensure hydration and nutrition are maintained during treatment. -- Cycle 3 day 8 scheduled for 02/19/2024.  Recheck labs.  Add back G-CSF as indicated.  Dose adjustments to be made as indicated.   Hypomagnesemia Magnesium  level 1.4 today.  Patient to receive 2 g magnesium  with treatment tomorrow.  Recheck prior to cycle 4 day 1 chemoimmunotherapy.  Anemia Blood count dropping with Hgb 8.1 and HCT 23.5.  WBC decreased to 4.3.  Platelet count 180.  Encouraged patient to trial OTC slow digest iron supplement every day.  Recommend she take with vitamin C .  Will add ferritin level to next labs and treat as indicated.  Plan Labs reviewed. - Decreasing blood count with Hgb 8.1 and HCT 23.5.  Falling WBC, now 4.3.  Will check ferritin and iron study prior to cycle 4 day 1 chemotherapy and immunotherapy with cisplatin , gemcitabine , and durvalumab .  Treat with IV iron for persistent anemia with low ferritin. - CMP is unremarkable. - Magnesium  1.4.  Treated with 2 g magnesium  IV during treatment tomorrow, 02/19/2024. Labs and patient presentation are appropriate for treatment tomorrow.  Proceed with cycle 3 day 8 chemotherapy cisplatin  and gemcitabine . Administer G-CSF on 02/22/2024 as scheduled. Labs, follow-up, and cycle 4 day 1 of chemotherapy and immunotherapy as scheduled on 03/03/2024.   The patient understands the plans discussed today and is in agreement with them.  She knows to contact our office if she develops concerns prior to her next appointment.  I provided 25 minutes of face-to-face  time during this encounter and > 50% was spent counseling as documented under my assessment and plan.    Powell FORBES Lessen, NP  [facility]  [facility] - A DEPT OF [facility] [address] [place] [address] Dept: [phone] Dept Fax: [phone]   No orders of the defined types were placed in this encounter.     CHIEF COMPLAINT:  CC: adenocarcinoma of gallbladder   Current Treatment:  palliative chemoimmunotherapy with cisplatin , gemcitabine , and durvalumab   INTERVAL HISTORY:  [facility] is here today for repeat clinical assessment. She was last seen by me on 02/11/2024. She received Cycle 3 day 1 of cisplatin , gemcitabine , and durvalumab . Tomorrow, 02/19/2024, she will receive cisplatin  and gemcitabine . She is scheduled for G-CSF on 02/22/2024.  Patient states she continues to tolerate treatment well.  Her appetite is good.  She denies peripheral neuropathy.  She does have some cold sensitivity but this resolves on it's own. She reports improved fatigue.  She denies chest pain, chest pressure, or shortness of breath. She denies headaches or visual disturbances. She denies abdominal pain, nausea, vomiting, or changes in bowel or bladder habits.   She denies fevers or chills. She denies pain. Her appetite is good. Her weight has been stable.  I have reviewed the past medical history, past surgical history, social history and family history with the patient and they are unchanged from previous note.  ALLERGIES:  is allergic to sulfa antibiotics, forteo  [parathyroid hormone (recomb)], atorvastatin, and metformin hcl.  MEDICATIONS:  Current Outpatient Medications  Medication Sig Dispense Refill   albuterol  (VENTOLIN  HFA) 108 (90 Base) MCG/ACT inhaler Inhale 1-2 puffs into the lungs every 6 (six) hours as needed for wheezing or shortness of breath. 8 g 1   aspirin  81 MG EC tablet Take 81 mg by mouth daily.     budesonide  (PULMICORT ) 0.5 MG/2ML nebulizer solution Generic for Pulmicort . Inhale one vial in nebulizer twice a day. Rinse mouth after use. 60 mL 11   dexamethasone  (DECADRON ) 4 MG tablet  Take 2 tablets (8 mg) by mouth daily x 3 days starting the day after cisplatin  chemotherapy. Take with food. 30 tablet 1   docusate sodium  (COLACE) 100 MG capsule Take 500 mg by mouth at bedtime.     formoterol  (PERFOROMIST ) 20 MCG/2ML nebulizer solution Substituted for: Perforomist  Solution Inhale one vial in nebulizer twice a day. 60 mL 11   lidocaine -prilocaine  (EMLA ) cream Apply to affected area once 30 g 3   megestrol  (MEGACE  ES) 625 MG/5ML suspension Take 5 mLs (625 mg total) by mouth daily. 150 mL 3   metoprolol  succinate (TOPROL -XL) 50 MG 24 hr tablet TAKE 1 TABLET BY MOUTH ONCE DAILY. TAKE WITH OR IMMEDIATELY FOLLOWING A MEAL 90 tablet 1   morphine  (MS CONTIN ) 15 MG 12 hr tablet Take 1 tablet (15 mg total) by mouth every 12 (twelve) hours. 60 tablet 0   omeprazole  (PRILOSEC) 20 MG capsule Take 1 capsule (20 mg total) by mouth 2 (two) times daily before a meal. 60 capsule 3   ondansetron  (ZOFRAN ) 4 MG tablet Take 1 tablet (4 mg total) by mouth every 6 (six) hours as needed for nausea. 30 tablet 0   ondansetron  (ZOFRAN ) 8 MG tablet Take 1 tablet (8 mg total) by mouth every 8 (eight) hours as needed for nausea or vomiting. Start on the third day after cisplatin . 30 tablet 1   oxyCODONE  10 MG TABS Take 1-1.5 tablets (  10-15 mg total) by mouth every 4 (four) hours as needed for moderate pain (pain score 4-6) or severe pain (pain score 7-10). (Patient taking differently: Take 10-15 mg by mouth every 4 (four) hours as needed for moderate pain (pain score 4-6) or severe pain (pain score 7-10). Pt reports she takes this once in awhile) 60 tablet 0   polyethylene glycol (MIRALAX  / GLYCOLAX ) 17 g packet Take 17 g by mouth 2 (two) times daily.     prochlorperazine  (COMPAZINE ) 10 MG tablet Take 1 tablet (10 mg total) by mouth every 6 (six) hours as needed (Nausea or vomiting). 30 tablet 1   revefenacin  (YUPELRI ) 175 MCG/3ML nebulizer solution Inhale one vial in nebulizer once daily. Do not mix with other  nebulized medications. 30 mL 11   rosuvastatin  (CRESTOR ) 10 MG tablet Take 1 tablet by mouth once daily 90 tablet 0   spironolactone  (ALDACTONE ) 25 MG tablet Take 25 mg by mouth daily.     No current facility-administered medications for this visit.    HISTORY OF PRESENT ILLNESS:   Oncology History  Adenocarcinoma of gallbladder (HCC)  12/24/2023 Initial Diagnosis   Adenocarcinoma of gallbladder (HCC)   12/24/2023 Cancer Staging   Staging form: Gallbladder, AJCC 8th Edition - Clinical: Stage IVB (cTX, cNX, pM1) - Signed by Autumn Millman, MD on 12/24/2023 Histologic grade (G): G2 Histologic grading system: 3 grade system   01/01/2024 -  Chemotherapy   Patient is on Treatment Plan : BILIARY TRACT Cisplatin  + Gemcitabine  D1,8 + Durvalumab  (1500) D1 q21d / Durvalumab  (1500) q28d         REVIEW OF SYSTEMS:   Constitutional: Denies fevers, chills or abnormal weight loss.  Eyes: Denies blurriness of vision Ears, nose, mouth, throat, and face: Denies mucositis or sore throat Respiratory: Denies cough, dyspnea or wheezes Cardiovascular: Denies palpitation, chest discomfort or lower extremity swelling Gastrointestinal:  Denies nausea, heartburn or change in bowel habits Skin: Denies abnormal skin rashes Lymphatics: Denies new lymphadenopathy or easy bruising Neurological:Denies numbness, tingling or new weaknesses Behavioral/Psych: Mood is stable, no new changes  All other systems were reviewed with the patient and are negative.   VITALS:   Today's Vitals   02/18/24 1317 02/18/24 1321  BP: 134/62   Pulse: 69   Resp: 17   Temp: 98 F (36.7 C)   SpO2: 99%   Weight: 164 lb 9.6 oz (74.7 kg)   PainSc:  0-No pain   Body mass index is 30.11 kg/m.   Wt Readings from Last 3 Encounters:  02/18/24 164 lb 9.6 oz (74.7 kg)  02/11/24 166 lb 12.8 oz (75.7 kg)  01/27/24 156 lb 1.6 oz (70.8 kg)    Body mass index is 30.11 kg/m.  Performance status (ECOG): 1 - Symptomatic but  completely ambulatory  PHYSICAL EXAM:   GENERAL:alert, no distress and comfortable SKIN: skin color, texture, turgor are normal, no rashes or significant lesions EYES: normal, Conjunctiva are pink and non-injected, sclera clear OROPHARYNX:no exudate, no erythema and lips, buccal mucosa, and tongue normal  NECK: supple, thyroid  normal size, non-tender, without nodularity LYMPH:  no palpable lymphadenopathy in the cervical, axillary or inguinal LUNGS: clear to auscultation and percussion with normal breathing effort HEART: regular rate & rhythm and no murmurs and no lower extremity edema ABDOMEN:abdomen soft with normal bowel sounds.  She has mild tenderness with palpation of the right upper quadrant and epigastric area of the abdomen. Musculoskeletal:no cyanosis of digits and no clubbing  NEURO: alert & oriented x  3 with fluent speech, no focal motor/sensory deficits  LABORATORY DATA:  I have reviewed the data as listed    Component Value Date/Time   NA 141 02/11/2024 1252   NA 140 12/22/2023 0938   K 3.7 02/11/2024 1252   CL 110 02/11/2024 1252   CO2 25 02/11/2024 1252   GLUCOSE 101 (H) 02/11/2024 1252   BUN 16 02/11/2024 1252   BUN 10 12/22/2023 0938   CREATININE 0.74 02/11/2024 1252   CALCIUM  8.4 (L) 02/11/2024 1252   PROT 5.9 (L) 02/11/2024 1252   PROT 7.2 12/22/2023 0938   ALBUMIN  3.4 (L) 02/11/2024 1252   ALBUMIN  4.7 12/22/2023 0938   AST 14 (L) 02/11/2024 1252   ALT 11 02/11/2024 1252   ALKPHOS 86 02/11/2024 1252   BILITOT 0.3 02/11/2024 1252   GFRNONAA >60 02/11/2024 1252   GFRAA 100 04/24/2020 1600    Lab Results  Component Value Date   WBC 4.3 02/18/2024   NEUTROABS 2.8 02/18/2024   HGB 8.1 (L) 02/18/2024   HCT 23.5 (L) 02/18/2024   MCV 89.0 02/18/2024   PLT 180 02/18/2024

## 2024-02-19 ENCOUNTER — Other Ambulatory Visit

## 2024-02-19 ENCOUNTER — Inpatient Hospital Stay

## 2024-02-19 ENCOUNTER — Ambulatory Visit: Admitting: Physician Assistant

## 2024-02-19 VITALS — BP 133/55 | HR 77 | Temp 98.5°F | Resp 16

## 2024-02-19 DIAGNOSIS — Z5112 Encounter for antineoplastic immunotherapy: Secondary | ICD-10-CM | POA: Diagnosis not present

## 2024-02-19 DIAGNOSIS — C23 Malignant neoplasm of gallbladder: Secondary | ICD-10-CM

## 2024-02-19 MED ORDER — POTASSIUM CHLORIDE IN NACL 20-0.9 MEQ/L-% IV SOLN
Freq: Once | INTRAVENOUS | Status: AC
  Filled 2024-02-19: qty 1000

## 2024-02-19 MED ORDER — MAGNESIUM SULFATE 2 GM/50ML IV SOLN
2.0000 g | Freq: Once | INTRAVENOUS | Status: AC
  Administered 2024-02-19: 2 g via INTRAVENOUS
  Filled 2024-02-19: qty 50

## 2024-02-19 MED ORDER — SODIUM CHLORIDE 0.9 % IV SOLN: INTRAVENOUS | Status: DC

## 2024-02-19 MED ORDER — SODIUM CHLORIDE 0.9 % IV SOLN
1000.0000 mg/m2 | Freq: Once | INTRAVENOUS | Status: AC
  Administered 2024-02-19: 1786 mg via INTRAVENOUS
  Filled 2024-02-19: qty 46.97

## 2024-02-19 MED ORDER — SODIUM CHLORIDE 0.9 % IV SOLN
25.0000 mg/m2 | Freq: Once | INTRAVENOUS | Status: AC
  Administered 2024-02-19: 50 mg via INTRAVENOUS
  Filled 2024-02-19: qty 50

## 2024-02-19 MED ORDER — SODIUM CHLORIDE 0.9% FLUSH
10.0000 mL | INTRAVENOUS | Status: DC | PRN
Start: 2024-02-19 — End: 2024-02-19
  Administered 2024-02-19: 10 mL

## 2024-02-19 MED ORDER — PALONOSETRON HCL INJECTION 0.25 MG/5ML
0.2500 mg | Freq: Once | INTRAVENOUS | Status: AC
  Administered 2024-02-19: 0.25 mg via INTRAVENOUS
  Filled 2024-02-19: qty 5

## 2024-02-19 MED ORDER — APREPITANT 130 MG/18ML IV EMUL
130.0000 mg | Freq: Once | INTRAVENOUS | Status: AC
  Administered 2024-02-19: 130 mg via INTRAVENOUS
  Filled 2024-02-19: qty 18

## 2024-02-19 MED ORDER — DEXAMETHASONE SODIUM PHOSPHATE 10 MG/ML IJ SOLN
10.0000 mg | Freq: Once | INTRAMUSCULAR | Status: AC
  Administered 2024-02-19: 10 mg via INTRAVENOUS
  Filled 2024-02-19: qty 1

## 2024-02-19 NOTE — Patient Instructions (Addendum)
 [facility] - A DEPT OF [facility]  Discharge Instructions: Thank you for choosing [facility] to provide your oncology and hematology care.   If you have a lab appointment with the [facility], please go directly to the [facility] and check in at the registration area.   Wear comfortable clothing and clothing appropriate for easy access to any Portacath or PICC line.   We strive to give you quality time with your provider. You may need to reschedule your appointment if you arrive late (15 or more minutes).  Arriving late affects you and other patients whose appointments are after yours.  Also, if you miss three or more appointments without notifying the office, you may be dismissed from the clinic at the provider's discretion.      For prescription refill requests, have your pharmacy contact our office and allow 72 hours for refills to be completed.    Today you received the following chemotherapy and/or immunotherapy agents: Gemcitabine  (Gemzar ) and Cisplatin    To help prevent nausea and vomiting after your treatment, we encourage you to take your nausea medication as directed.  BELOW ARE SYMPTOMS THAT SHOULD BE REPORTED IMMEDIATELY: *FEVER GREATER THAN 100.4 F (38 C) OR HIGHER *CHILLS OR SWEATING *NAUSEA AND VOMITING THAT IS NOT CONTROLLED WITH YOUR NAUSEA MEDICATION *UNUSUAL SHORTNESS OF BREATH *UNUSUAL BRUISING OR BLEEDING *URINARY PROBLEMS (pain or burning when urinating, or frequent urination) *BOWEL PROBLEMS (unusual diarrhea, constipation, pain near the anus) TENDERNESS IN MOUTH AND THROAT WITH OR WITHOUT PRESENCE OF ULCERS (sore throat, sores in mouth, or a toothache) UNUSUAL RASH, SWELLING OR PAIN  UNUSUAL VAGINAL DISCHARGE OR ITCHING   Items with * indicate a potential emergency and should be followed up as soon as possible or go to the Emergency Department if any problems should occur.  Please show the CHEMOTHERAPY ALERT  CARD or IMMUNOTHERAPY ALERT CARD at check-in to the Emergency Department and triage nurse.  Should you have questions after your visit or need to cancel or reschedule your appointment, please contact [facility]  Dept: [phone]  and follow the prompts.  Office hours are 8:00 a.m. to 4:30 p.m. Monday - Friday. Please note that voicemails left after 4:00 p.m. may not be returned until the following business day.  We are closed weekends and major holidays. You have access to a nurse at all times for urgent questions. Please call the main number to the clinic Dept: [phone] and follow the prompts.   For any non-urgent questions, you may also contact your provider using MyChart. We now offer e-Visits for anyone 26 and older to request care online for non-urgent symptoms. For details visit [email or web address].   Also download the MyChart app! Go to the app store, search MyChart, open the app, select [facility], and log in with your MyChart username and password.  Hypomagnesemia Hypomagnesemia is a condition in which the level of magnesium  in the blood is too low. Magnesium  is a mineral that is found in many foods. It is used in many different processes in the body. Hypomagnesemia can affect every organ in the body. In severe cases, it can cause life-threatening problems. What are the causes? This condition may be caused by: Not getting enough magnesium  in your diet or not having enough healthy foods to eat (malnutrition). Problems with magnesium  absorption in the intestines. Dehydration. Excessive use of alcohol. Vomiting.  Severe or long-term (chronic) diarrhea. Some medicines, including medicines that make you urinate more often (diuretics). Certain diseases, such as kidney disease, diabetes, celiac disease, and overactive thyroid . What are the signs or symptoms? Symptoms of this condition include: Loss of appetite, nausea, and  vomiting. Involuntary shaking or trembling of a body part (tremor). Muscle weakness or tingling in the arms and legs. Sudden tightening of muscles (muscle spasms). Confusion. Psychiatric issues, such as: Depression and irritability. Psychosis. A feeling of fluttering of the heart (palpitations). Seizures. These symptoms are more severe if magnesium  levels drop suddenly. How is this diagnosed? This condition may be diagnosed based on: Your symptoms and medical history. A physical exam. Blood and urine tests. How is this treated? Treatment depends on the cause and the severity of the condition. It may be treated by: Taking a magnesium  supplement. This can be taken in pill form. If the condition is severe, magnesium  is usually given through an IV. Making changes to your diet. You may be directed to eat foods that have a lot of magnesium , such as green leafy vegetables, peas, beans, and nuts. Not drinking alcohol. If you are struggling not to drink, ask your health care provider for help. Follow these instructions at home: Eating and drinking     Make sure that your diet includes foods with magnesium . Foods that have a lot of magnesium  in them include: Green leafy vegetables, such as spinach and broccoli. Beans and peas. Nuts and seeds, such as almonds and sunflower seeds. Whole grains, such as whole grain bread and fortified cereals. Drink fluids that contain salts and minerals (electrolytes), such as sports drinks, when you are active. Do not drink alcohol. General instructions Take over-the-counter and prescription medicines only as told by your health care provider. Take magnesium  supplements as directed if your health care provider tells you to take them. Have your magnesium  levels monitored as told by your health care provider. Keep all follow-up visits. This is important. Contact a health care provider if: You get worse instead of better. Your symptoms return. Get help  right away if: You develop severe muscle weakness. You have trouble breathing. You feel that your heart is racing. These symptoms may represent a serious problem that is an emergency. Do not wait to see if the symptoms will go away. Get medical help right away. Call your local emergency services (911 in the [place] Do not drive yourself to the hospital. Summary Hypomagnesemia is a condition in which the level of magnesium  in the blood is too low. Hypomagnesemia can affect every organ in the body. Treatment may include eating more foods that contain magnesium , taking magnesium  supplements, and not drinking alcohol. Have your magnesium  levels monitored as told by your health care provider. This information is not intended to replace advice given to you by your health care provider. Make sure you discuss any questions you have with your health care provider. Document Revised: 11/27/2020 Document Reviewed: 11/27/2020 Elsevier Patient Education  2024 [employer].

## 2024-02-22 ENCOUNTER — Inpatient Hospital Stay

## 2024-02-22 DIAGNOSIS — C23 Malignant neoplasm of gallbladder: Secondary | ICD-10-CM

## 2024-02-22 DIAGNOSIS — Z5112 Encounter for antineoplastic immunotherapy: Secondary | ICD-10-CM | POA: Diagnosis not present

## 2024-02-22 MED ORDER — PEGFILGRASTIM-CBQV 6 MG/0.6ML ~~LOC~~ SOSY
6.0000 mg | PREFILLED_SYRINGE | Freq: Once | SUBCUTANEOUS | Status: AC
  Administered 2024-02-22 (×2): 6 mg via SUBCUTANEOUS
  Filled 2024-02-22: qty 0.6

## 2024-03-03 ENCOUNTER — Encounter: Payer: Self-pay | Admitting: Oncology

## 2024-03-03 ENCOUNTER — Inpatient Hospital Stay

## 2024-03-03 ENCOUNTER — Other Ambulatory Visit (HOSPITAL_COMMUNITY): Payer: Self-pay

## 2024-03-03 ENCOUNTER — Other Ambulatory Visit: Payer: Self-pay

## 2024-03-03 ENCOUNTER — Inpatient Hospital Stay (HOSPITAL_BASED_OUTPATIENT_CLINIC_OR_DEPARTMENT_OTHER): Admitting: Oncology

## 2024-03-03 VITALS — BP 165/65 | HR 81 | Temp 97.6°F | Resp 17 | Ht 62.0 in | Wt 165.3 lb

## 2024-03-03 DIAGNOSIS — T451X5A Adverse effect of antineoplastic and immunosuppressive drugs, initial encounter: Secondary | ICD-10-CM

## 2024-03-03 DIAGNOSIS — C23 Malignant neoplasm of gallbladder: Secondary | ICD-10-CM

## 2024-03-03 DIAGNOSIS — D6481 Anemia due to antineoplastic chemotherapy: Secondary | ICD-10-CM | POA: Diagnosis not present

## 2024-03-03 DIAGNOSIS — D72829 Elevated white blood cell count, unspecified: Secondary | ICD-10-CM | POA: Insufficient documentation

## 2024-03-03 DIAGNOSIS — Z95828 Presence of other vascular implants and grafts: Secondary | ICD-10-CM

## 2024-03-03 DIAGNOSIS — Z5112 Encounter for antineoplastic immunotherapy: Secondary | ICD-10-CM | POA: Diagnosis not present

## 2024-03-03 LAB — CMP (CANCER CENTER ONLY)
ALT: 7 U/L (ref 0–44)
AST: 12 U/L — ABNORMAL LOW (ref 15–41)
Albumin: 3.4 g/dL — ABNORMAL LOW (ref 3.5–5.0)
Alkaline Phosphatase: 90 U/L (ref 38–126)
Anion gap: 6 (ref 5–15)
BUN: 9 mg/dL (ref 8–23)
CO2: 26 mmol/L (ref 22–32)
Calcium: 7.7 mg/dL — ABNORMAL LOW (ref 8.9–10.3)
Chloride: 113 mmol/L — ABNORMAL HIGH (ref 98–111)
Creatinine: 0.75 mg/dL (ref 0.44–1.00)
GFR, Estimated: >60 mL/min (ref >60)
Glucose, Bld: 105 mg/dL — ABNORMAL HIGH (ref 70–99)
Potassium: 3.3 mmol/L — ABNORMAL LOW (ref 3.5–5.1)
Sodium: 145 mmol/L (ref 135–145)
Total Bilirubin: 0.3 mg/dL (ref 0.0–1.2)
Total Protein: 5.3 g/dL — ABNORMAL LOW (ref 6.5–8.1)

## 2024-03-03 LAB — CBC WITH DIFFERENTIAL (CANCER CENTER ONLY)
Abs Immature Granulocytes: 0.86 K/uL — ABNORMAL HIGH (ref 0.00–0.07)
Basophils Absolute: 0.1 K/uL (ref 0.0–0.1)
Basophils Relative: 0 %
Eosinophils Absolute: 0 K/uL (ref 0.0–0.5)
Eosinophils Relative: 0 %
HCT: 21.1 % — ABNORMAL LOW (ref 36.0–46.0)
Hemoglobin: 7.4 g/dL — ABNORMAL LOW (ref 12.0–15.0)
Immature Granulocytes: 5 %
Lymphocytes Relative: 12 %
Lymphs Abs: 2.2 K/uL (ref 0.7–4.0)
MCH: 33 pg (ref 26.0–34.0)
MCHC: 35.1 g/dL (ref 30.0–36.0)
MCV: 94.2 fL (ref 80.0–100.0)
Monocytes Absolute: 1.9 K/uL — ABNORMAL HIGH (ref 0.1–1.0)
Monocytes Relative: 10 %
Neutro Abs: 14.2 K/uL — ABNORMAL HIGH (ref 1.7–7.7)
Neutrophils Relative %: 73 %
Platelet Count: 133 K/uL — ABNORMAL LOW (ref 150–400)
RBC: 2.24 MIL/uL — ABNORMAL LOW (ref 3.87–5.11)
RDW: 22.3 % — ABNORMAL HIGH (ref 11.5–15.5)
WBC Count: 19.3 K/uL — ABNORMAL HIGH (ref 4.0–10.5)
nRBC: 0.1 % (ref 0.0–0.2)

## 2024-03-03 LAB — SAMPLE TO BLOOD BANK

## 2024-03-03 LAB — MAGNESIUM: Magnesium: 1.2 mg/dL — ABNORMAL LOW (ref 1.7–2.4)

## 2024-03-03 LAB — FERRITIN: Ferritin: 317 ng/mL — ABNORMAL HIGH (ref 11–307)

## 2024-03-03 LAB — IRON AND IRON BINDING CAPACITY (CC-WL,HP ONLY)
Iron: 103 ug/dL (ref 28–170)
Saturation Ratios: 43 % — ABNORMAL HIGH (ref 10.4–31.8)
TIBC: 238 ug/dL — ABNORMAL LOW (ref 250–450)
UIBC: 135 ug/dL — ABNORMAL LOW (ref 148–442)

## 2024-03-03 LAB — PREPARE RBC (CROSSMATCH)

## 2024-03-03 MED ORDER — SODIUM CHLORIDE 0.9% FLUSH
10.0000 mL | Freq: Once | INTRAVENOUS | Status: AC
  Administered 2024-03-03: 10 mL

## 2024-03-03 MED ORDER — ACETAMINOPHEN 325 MG PO TABS
650.0000 mg | ORAL_TABLET | Freq: Once | ORAL | Status: AC
  Administered 2024-03-03: 650 mg via ORAL
  Filled 2024-03-03: qty 2

## 2024-03-03 MED ORDER — DIPHENHYDRAMINE HCL 25 MG PO CAPS
25.0000 mg | ORAL_CAPSULE | Freq: Once | ORAL | Status: AC
  Administered 2024-03-03: 25 mg via ORAL
  Filled 2024-03-03: qty 1

## 2024-03-03 MED ORDER — MAGNESIUM OXIDE -MG SUPPLEMENT 400 (240 MG) MG PO TABS
400.0000 mg | ORAL_TABLET | Freq: Two times a day (BID) | ORAL | 1 refills | Status: DC
  Filled 2024-03-03: qty 60, 30d supply, fill #0
  Filled 2024-03-11: qty 60, 30d supply, fill #1

## 2024-03-03 MED ORDER — SODIUM CHLORIDE 0.9% IV SOLUTION
250.0000 mL | INTRAVENOUS | Status: DC
  Administered 2024-03-03: 100 mL via INTRAVENOUS

## 2024-03-03 NOTE — Progress Notes (Signed)
 [facility]  ONCOLOGY CLINIC PROGRESS NOTE   Patient Care Team: Janice Pace as PCP - General (Physician Assistant) Pcp, No Janice Frieze, DO (Family Medicine) [place], [facility] Janice Evalene CROME, RN as Oncology Nurse Navigator  PATIENT NAME: Janice Pace   MR#: [id number] DOB: Aug 05, 1945  Date of visit: 03/03/2024   ASSESSMENT & PLAN:   Janice Pace is a 78 y.o. 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   Adenocarcinoma of gallbladder [facility]) Please review oncology history for additional details and timeline of events.  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 Biopsy from the omentum confirmed adenocarcinoma, originating from the gallbladder.  Immunostains support diagnosis of gallbladder carcinoma.    Previously I discussed diagnosis, staging, prognosis, plan of care, treatment options.  Reviewed NCCN guidelines. Given stage IV disease, all treatment options are palliative in nature and not curative in intent.  This was discussed with patient, her daughter and son-in-law who were accompanying.  They verbalized understanding.    Discussed the aggressive nature of stage IV gallbladder adenocarcinoma and overall poor prognosis.  Life expectancy with treatment is 1 to 1.5 years; without treatment, less than six months.   NGS panel testing on the specimen and also liquid biopsy for mutation analysis showed no actionable mutations.  Staging PET scan on 12/29/2023 showed known gallbladder mass extending into the liver.  Peritoneal carcinomatosis noted.  Numerous lung nodules were identified, small bowel with FDG uptake, concerning for metastatic disease in the lungs.   Metastatic lymphadenopathy was noted in the abdomen and retroperitoneum.  This scan will serve as a good baseline for future compression.  Discussed results with the patient and family members previously.   Plan made to proceed with palliative systemic chemoimmunotherapy with gemcitabine , cisplatin  and durvalumab .  She started this from 01/01/2024.  Tolerating chemo well so far.  No major side effects.  Completed 3 cycles of chemotherapy so far with growth factor support as needed.  Labs today reveal leukocytosis with white count of 19,300, presumably from G-CSF support.  Hemoglobin decreased at 7.4, platelet 133,000.  Low potassium at 3.3 and low magnesium  at 1.2.  She will receive 1 unit of PRBC transfusion today in preparation for chemotherapy tomorrow.  Otherwise no dose-limiting toxicities.   We will proceed with cycle 4-day 1 of chemotherapy tomorrow as scheduled.  She will receive extra potassium and magnesium  tomorrow.  She was also started on magnesium  oxide 400 mg p.o. twice daily at home.  - Ensure hydration and nutrition are maintained during treatment.  RTC in 1 week for labs, follow-up and continuation of chemotherapy.  Following completion of 4 cycles of chemotherapy, restaging PET scan will be obtained.  Request placed today.  Anemia due to antineoplastic chemotherapy Hemoglobin decreasing since starting chemotherapy, currently 7.4, down from 8.1 two weeks ago. Reports fatigue, denies chest pain or shortness of breath. Blood transfusion recommended as hemoglobin is below 8, with potential further decline due to chemotherapy.  She was agreeable to proceed with blood transfusion.  She was given 1 unit of PRBC transfusion in clinic today.  Leukocytosis White blood cell count elevated due to booster injection. Expected response to granulocyte colony-stimulating factor.  Hypomagnesemia Magnesium  levels have been low on a few occasions.  Not currently taking magnesium  supplements at  home.  Magnesium  further down at 1.2 today.  She will receive extra magnesium  for total of 4 g of mag sulfate tomorrow during chemotherapy.  She was also started on magnesium  oxide 400 mg p.o. twice daily at home.   I reviewed lab results and outside records for this visit and discussed relevant results with the patient. Diagnosis, plan of care and treatment options were also discussed in detail with the patient. Opportunity provided to ask questions and answers provided to her apparent satisfaction. Provided instructions to call our clinic with any problems, questions or concerns prior to return visit. I recommended to continue follow-up with PCP and sub-specialists. She verbalized understanding and agreed with the plan.   NCCN guidelines have been consulted in the planning of this patient's care.  I spent a total of 40 minutes during this encounter with the patient including review of chart and various tests results, discussions about plan of care and coordination of care plan.   Janice Patten, MD  03/03/2024 11:46 AM  [facility] - A DEPT OF Janice Pace [address] [place] [address] Dept: [phone] Dept Fax: [phone]    CHIEF COMPLAINT/ REASON FOR VISIT:   Stage IV gallbladder adenocarcinoma with peritoneal mets and lung mets.  Current Treatment: Palliative systemic chemoimmunotherapy with cisplatin , gemcitabine , durvalumab  started from 01/01/2024.  INTERVAL HISTORY:    Discussed the use of AI scribe software for clinical note transcription with the patient, who gave verbal consent to proceed.  History of Present Illness  Janice Pace is a 78 year old female undergoing chemotherapy who presents for follow-up and management of anemia.  No chest pain or shortness of breath. She denies any blood loss and has not had any previous blood transfusions.  During one treatment session, fluids were  administered too quickly, causing significant fatigue, but this has since been adjusted. She has not experienced nausea or vomiting as side effects of her treatment. Her appetite fluctuates, but she takes medication as prescribed to help increase it when needed. No tingling, numbness, fevers, chills, or night sweats, although she notes feeling cold all the time.  Her bowel movements are regular with the use of Miralax , and she has not experienced constipation or diarrhea. Her weight has remained stable. She is not currently taking magnesium  at home, although her magnesium  levels have been low at times.     I have reviewed the past medical history, past surgical history, social history and family history with the patient and they are unchanged from previous note.  HISTORY OF PRESENT ILLNESS:   ONCOLOGY HISTORY:   78 y.o. lady with past medical history of essential hypertension, osteoarthritis, COPD/asthma, GERD, depression, anxiety, hiatal hernia, chronic back pain presented to the ED on 12/14/2023 with complaints of worsening epigastric/abdominal pain over 2-3 weeks.  Routine blood work was unremarkable in the ER.  However CT abdomen and pelvis showed irregular appearance of the gallbladder with suggestion of capsular thickening along the fundus of the gallbladder.  Gallbladder appeared inseparable from the adjacent portion of the liver.  Clinical picture was concerning for gallbladder carcinoma.  Mesenteric nodular thickening along the mid lower abdominal cavity indicating peritoneal carcinomatosis.  Soft tissue nodular mass in the left lower quadrant region of the pelvis measuring 3.6 x 2.7 cm, likely ovarian neoplastic lesion.  Multiple lung nodules in the lower lobes bilaterally, largest measuring 12 mm in the  right lower lobe, concerning for pulmonary metastatic disease.   She was admitted to the hospital for further evaluation and management.  IR was consulted for peritoneal mass biopsy.  CA 19-9  was significant elevated at 14,368.  CEA was increased at 98.7, CA-125 was also increased at 91.3.   On 12/14/2023, MRI abdomen/MRCP showed thickened, contracted gallbladder with a heterogeneous mass arising from the gallbladder fundus, contiguous with the adjacent liver parenchyma of hepatic segment IVB measuring 3.5 x 2.8 cm. Multiple small rim enhancing lesions within the liver parenchyma. Enlarged, necrotic appearing portacaval and porta hepatis lymph nodes. Small, although rim enhancing and abnormal appearing retroperitoneal lymph nodes. Peritoneal stranding and nodularity, incompletely visualized on this examination although present in the left upper quadrant, consistent with peritoneal metastatic disease. Numerous small bilateral pulmonary nodules, better assessed by CT. 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   On 12/15/2023, she underwent CT-guided biopsy of the omental lesion.  Pathology showed metastatic well to moderately differentiated adenocarcinoma. The tumor is positive for cytokeratin 7 and shows focal weak positivity for the GI marker CDX2.  The tumor is negative for the GI markers cytokeratin 20.  The tumor is also negative for the GU and GYN marker PAX8.  The tumor is negative for the pulmonary adeno marker TTF-1. This immunohistochemical pattern and histomorphology would be compatible with the clinical suspicion of a gallbladder primary.  The differential diagnosis would also include an upper GI or pancreaticobiliary primary.    Patient presented to our clinic on 12/24/2023 to establish care with us .  Request placed for staging PET scan.  NGS panel testing requested on the specimen and also liquid biopsy obtained.  It showed no actionable mutations.  Staging PET scan on 12/29/2023 showed known gallbladder mass extending into the liver.  Peritoneal carcinomatosis noted.  Numerous lung nodules were identified, small bowel with FDG  uptake, concerning for metastatic disease in the lungs.  Metastatic lymphadenopathy was noted in the abdomen and retroperitoneum.  This scan will serve as a good baseline for future compression.    Plan for palliative systemic chemoimmunotherapy with cisplatin , gemcitabine , durvalumab . Scheduled to start C1D1 from 01/01/24.   Oncology History  Adenocarcinoma of gallbladder (HCC)  12/24/2023 Initial Diagnosis   Adenocarcinoma of gallbladder (HCC)   12/24/2023 Cancer Staging   Staging form: Gallbladder, AJCC 8th Edition - Clinical: Stage IVB (cTX, cNX, pM1) - Signed by Autumn Millman, MD on 12/24/2023 Histologic grade (G): G2 Histologic grading system: 3 grade system   01/01/2024 -  Chemotherapy   Patient is on Treatment Plan : BILIARY TRACT Cisplatin  + Gemcitabine  D1,8 + Durvalumab  (1500) D1 q21d / Durvalumab  (1500) q28d         REVIEW OF SYSTEMS:   Review of Systems - Oncology  All other pertinent systems were reviewed with the patient and are negative.  ALLERGIES: She is allergic to sulfa antibiotics, forteo  [parathyroid hormone (recomb)], atorvastatin, and metformin hcl.  MEDICATIONS:  Current Outpatient Medications  Medication Sig Dispense Refill   albuterol  (VENTOLIN  HFA) 108 (90 Base) MCG/ACT inhaler Inhale 1-2 puffs into the lungs every 6 (six) hours as needed for wheezing or shortness of breath. 8 g 1   aspirin  81 MG EC tablet Take 81 mg by mouth daily.     budesonide  (PULMICORT ) 0.5 MG/2ML nebulizer solution Generic for Pulmicort . Inhale one vial in nebulizer twice a day. Rinse mouth after use. 60 mL 11   dexamethasone  (DECADRON ) 4 MG tablet Take 2 tablets (  8 mg) by mouth daily x 3 days starting the day after cisplatin  chemotherapy. Take with food. 30 tablet 1   docusate sodium  (COLACE) 100 MG capsule Take 500 mg by mouth at bedtime.     formoterol  (PERFOROMIST ) 20 MCG/2ML nebulizer solution Substituted for: Perforomist  Solution Inhale one vial in nebulizer twice a day. 60 mL  11   lidocaine -prilocaine  (EMLA ) cream Apply to affected area once 30 g 3   magnesium  oxide (MAG-OX) 400 (240 Mg) MG tablet Take 1 tablet (400 mg total) by mouth 2 (two) times daily. 60 tablet 1   megestrol  (MEGACE  ES) 625 MG/5ML suspension Take 5 mLs (625 mg total) by mouth daily. 150 mL 3   metoprolol  succinate (TOPROL -XL) 50 MG 24 hr tablet TAKE 1 TABLET BY MOUTH ONCE DAILY. TAKE WITH OR IMMEDIATELY FOLLOWING A MEAL 90 tablet 1   morphine  (MS CONTIN ) 15 MG 12 hr tablet Take 1 tablet (15 mg total) by mouth every 12 (twelve) hours. 60 tablet 0   omeprazole  (PRILOSEC) 20 MG capsule Take 1 capsule (20 mg total) by mouth 2 (two) times daily before a meal. 60 capsule 3   ondansetron  (ZOFRAN ) 4 MG tablet Take 1 tablet (4 mg total) by mouth every 6 (six) hours as needed for nausea. 30 tablet 0   ondansetron  (ZOFRAN ) 8 MG tablet Take 1 tablet (8 mg total) by mouth every 8 (eight) hours as needed for nausea or vomiting. Start on the third day after cisplatin . 30 tablet 1   oxyCODONE  10 MG TABS Take 1-1.5 tablets (10-15 mg total) by mouth every 4 (four) hours as needed for moderate pain (pain score 4-6) or severe pain (pain score 7-10). (Patient taking differently: Take 10-15 mg by mouth every 4 (four) hours as needed for moderate pain (pain score 4-6) or severe pain (pain score 7-10). Pt reports she takes this once in awhile) 60 tablet 0   polyethylene glycol (MIRALAX  / GLYCOLAX ) 17 g packet Take 17 g by mouth 2 (two) times daily.     prochlorperazine  (COMPAZINE ) 10 MG tablet Take 1 tablet (10 mg total) by mouth every 6 (six) hours as needed (Nausea or vomiting). 30 tablet 1   revefenacin  (YUPELRI ) 175 MCG/3ML nebulizer solution Inhale one vial in nebulizer once daily. Do not mix with other nebulized medications. 30 mL 11   rosuvastatin  (CRESTOR ) 10 MG tablet Take 1 tablet by mouth once daily 90 tablet 0   spironolactone  (ALDACTONE ) 25 MG tablet Take 25 mg by mouth daily.     No current  facility-administered medications for this visit.   Facility-Administered Medications Ordered in Other Visits  Medication Dose Route Frequency Provider Last Rate Last Admin   0.9 %  sodium chloride  infusion (Manually program via Guardrails IV Fluids)  250 mL Intravenous Continuous Janay Canan, MD       acetaminophen  (TYLENOL ) tablet 650 mg  650 mg Oral Once Kamariya Blevens, MD       diphenhydrAMINE  (BENADRYL ) capsule 25 mg  25 mg Oral Once Jeyren Danowski, MD         VITALS:   Blood pressure (!) 165/65, pulse 81, temperature 97.6 F (36.4 C), temperature source Temporal, resp. rate 17, height 5' 2 (1.575 m), weight 165 lb 5 oz (75 kg), SpO2 99%.  Wt Readings from Last 3 Encounters:  03/03/24 165 lb 5 oz (75 kg)  02/18/24 164 lb 9.6 oz (74.7 kg)  02/11/24 166 lb 12.8 oz (75.7 kg)    Body mass index is 30.24  kg/m.    Onc Performance Status - 03/03/24 0945       ECOG Perf Status   ECOG Perf Status Ambulatory and capable of all selfcare but unable to carry out any work activities.  Up and about more than 50% of waking hours      KPS SCALE   KPS % SCORE Cares for self, unable to carry on normal activity or to do active work            PHYSICAL EXAM:   Physical Exam Constitutional:      General: She is not in acute distress.    Appearance: Normal appearance.  HENT:     Head: Normocephalic and atraumatic.  Cardiovascular:     Rate and Rhythm: Normal rate.  Pulmonary:     Effort: Pulmonary effort is normal. No respiratory distress.  Abdominal:     General: There is no distension.  Neurological:     General: No focal deficit present.     Mental Status: She is alert and oriented to person, place, and time.  Psychiatric:        Mood and Affect: Mood normal.        Behavior: Behavior normal.       LABORATORY DATA:   I have reviewed the data as listed.  Results for orders placed or performed in visit on 03/03/24  Type and screen  Result Value Ref Range    ABO/RH(D) PENDING    Antibody Screen PENDING    Sample Expiration      03/06/2024,2359 Performed at [facility], [address] [place], [place] [address]   Prepare RBC (crossmatch)  Result Value Ref Range   Order Confirmation      ORDER PROCESSED BY BLOOD BANK Performed at [facility], [address], [place], [place] [address]   Results for orders placed or performed in visit on 03/03/24  Sample to Blood Bank  Result Value Ref Range   Blood Bank Specimen SAMPLE AVAILABLE FOR TESTING    Sample Expiration      03/06/2024,2359 Performed at [facility], [address], [place], [place] [address]   Results for orders placed or performed in visit on 03/03/24  Magnesium   Result Value Ref Range   Magnesium  1.2 (L) 1.7 - 2.4 mg/dL  CMP (Cancer Center only)  Result Value Ref Range   Sodium 145 135 - 145 mmol/L   Potassium 3.3 (L) 3.5 - 5.1 mmol/L   Chloride 113 (H) 98 - 111 mmol/L   CO2 26 22 - 32 mmol/L   Glucose, Bld 105 (H) 70 - 99 mg/dL   BUN 9 8 - 23 mg/dL   Creatinine 9.24 9.55 - 1.00 mg/dL   Calcium  7.7 (L) 8.9 - 10.3 mg/dL   Total Protein 5.3 (L) 6.5 - 8.1 g/dL   Albumin  3.4 (L) 3.5 - 5.0 g/dL   AST 12 (L) 15 - 41 U/L   ALT 7 0 - 44 U/L   Alkaline Phosphatase 90 38 - 126 U/L   Total Bilirubin 0.3 0.0 - 1.2 mg/dL   GFR, Estimated >39 >39 mL/min   Anion gap 6 5 - 15  CBC with Differential (Cancer Center Only)  Result Value Ref Range   WBC Count 19.3 (H) 4.0 - 10.5 K/uL   RBC 2.24 (L) 3.87 - 5.11 MIL/uL   Hemoglobin 7.4 (L) 12.0 - 15.0 g/dL   HCT 78.8 (L) 63.9 - 53.9 %   MCV 94.2  80.0 - 100.0 fL   MCH 33.0 26.0 - 34.0 pg   MCHC 35.1 30.0 - 36.0 g/dL   RDW 77.6 (H) 88.4 - 84.4 %   Platelet Count 133 (L) 150 - 400 K/uL   nRBC 0.1 0.0 - 0.2 %   Neutrophils Relative % 73 %   Neutro Abs 14.2 (H) 1.7 - 7.7 K/uL   Lymphocytes Relative 12 %   Lymphs Abs 2.2 0.7 - 4.0 K/uL   Monocytes Relative 10 %   Monocytes  Absolute 1.9 (H) 0.1 - 1.0 K/uL   Eosinophils Relative 0 %   Eosinophils Absolute 0.0 0.0 - 0.5 K/uL   Basophils Relative 0 %   Basophils Absolute 0.1 0.0 - 0.1 K/uL   Immature Granulocytes 5 %   Abs Immature Granulocytes 0.86 (H) 0.00 - 0.07 K/uL  Iron and Iron Binding Capacity ([facility] only)  Result Value Ref Range   Iron 103 28 - 170 ug/dL   TIBC 761 (L) 749 - 549 ug/dL   Saturation Ratios 43 (H) 10.4 - 31.8 %   UIBC 135 (L) 148 - 442 ug/dL      RADIOGRAPHIC STUDIES:  I have personally reviewed the radiological images as listed and agree with the findings in the report.  No results found.   CODE STATUS:  Code Status History     Date Active Date Inactive Code Status Order ID Comments User Context   12/14/2023 1601 12/16/2023 2004 Limited: Do not attempt resuscitation (DNR) -DNR-LIMITED -Do Not Intubate/DNI  [id number]  Caleen Burgess BROCKS, MD ED   09/17/2022 1236 09/18/2022 1519 Full Code [id number]  Onetha Kuba, MD Inpatient   02/27/2021 1602 03/09/2021 2325 Full Code [id number]  Onetha Kuba, MD Inpatient   06/27/2020 1110 06/28/2020 0504 Full Code [id number]  Derrill Dawn, MD HOV    Questions for Most Recent Historical Code Status (Order [id number])     Question Answer   If pulseless and not breathing No CPR or chest compressions.   In Pre-Arrest Conditions (Patient Is Breathing and Has A Pulse) Do not intubate. Provide all appropriate non-invasive medical interventions. Avoid ICU transfer unless indicated or required.   Consent: Discussion documented in EHR or advanced directives reviewed                Advance Directive Documentation    Flowsheet Row Most Recent Value  Type of Advance Directive Healthcare Power of Attorney, Living will  Pre-existing out of facility DNR order (yellow form or pink MOST form) --  MOST Form in Place? --    Orders Placed This Encounter  Procedures   NM PET Image Restag (PS) Skull Base To Thigh    Standing Status:   Future    Expected Date:    03/17/2024    Expiration Date:   03/03/2025    If indicated for the ordered procedure, I authorize the administration of a radiopharmaceutical per Radiology protocol:   Yes    Preferred imaging location?:   [facility]   CBC with Differential (Cancer Center Only)    Standing Status:   Future    Expected Date:   03/25/2024    Expiration Date:   03/25/2025   CMP (Cancer Center only)    Standing Status:   Future    Expected Date:   03/25/2024    Expiration Date:   03/25/2025   Magnesium     Standing Status:   Future    Expected Date:   03/25/2024    Expiration Date:  03/25/2025   CBC with Differential (Cancer Center Only)    Standing Status:   Future    Expected Date:   04/01/2024    Expiration Date:   04/01/2025   CMP (Cancer Center only)    Standing Status:   Future    Expected Date:   04/01/2024    Expiration Date:   04/01/2025   Magnesium     Standing Status:   Future    Expected Date:   04/01/2024    Expiration Date:   04/01/2025   CBC with Differential (Cancer Center Only)    Standing Status:   Future    Expected Date:   04/15/2024    Expiration Date:   04/15/2025   CMP (Cancer Center only)    Standing Status:   Future    Expected Date:   04/15/2024    Expiration Date:   04/15/2025   T4    Standing Status:   Future    Expected Date:   04/15/2024    Expiration Date:   04/15/2025   TSH    Standing Status:   Future    Expected Date:   04/15/2024    Expiration Date:   04/15/2025   Magnesium     Standing Status:   Future    Expected Date:   04/15/2024    Expiration Date:   04/15/2025   CBC with Differential (Cancer Center Only)    Standing Status:   Future    Expected Date:   04/22/2024    Expiration Date:   04/22/2025   CMP (Cancer Center only)    Standing Status:   Future    Expected Date:   04/22/2024    Expiration Date:   04/22/2025   Magnesium     Standing Status:   Future    Expected Date:   04/22/2024    Expiration Date:   04/22/2025   Informed Consent Details:  Physician/Practitioner Attestation; Transcribe to consent form and obtain patient signature    Standing Status:   Standing    Number of Occurrences:   1    Physician/Practitioner attestation of informed consent for blood and or blood product transfusion:   I, the physician/practitioner, attest that I have discussed with the patient the benefits, risks, side effects, alternatives, likelihood of achieving goals and potential problems during recovery for the procedure that I have provided informed consent.    Product(s):   All Product(s)     Future Appointments  Date Time Provider Department Center  03/04/2024  9:30 AM [facility] INFUSION [facility] None  03/04/2024 10:30 AM Ivonne Harlene RAMAN, RD [facility] None  03/11/2024  8:30 AM [facility] FLUSH [facility] None  03/11/2024  9:00 AM Laterrica Libman, MD [facility] None  03/11/2024  9:30 AM [facility] INFUSION [facility] None  03/15/2024 10:45 AM [facility] FLUSH [facility] None  03/24/2024 10:45 AM [facility] FLUSH [facility] None  03/24/2024 11:15 AM Terez Freimark, MD [facility] None  03/25/2024 10:00 AM [facility] INFUSION [facility] None  03/31/2024 11:15 AM [facility] FLUSH [facility] None  03/31/2024 11:45 AM Tateanna Bach, MD [facility] None  04/01/2024 10:00 AM [facility] INFUSION [facility] None  04/04/2024  2:00 PM [facility] FLUSH [facility] None  04/11/2024  1:15 PM Dewald, Dorn NOVAK, MD [facility] None      This document was completed utilizing speech recognition software. Grammatical errors, random word insertions, pronoun errors, and incomplete sentences are an occasional consequence of this system due to software limitations, ambient noise, and hardware issues. Any formal questions or  concerns about the content, text or information contained within the body of this dictation should be directly addressed to the provider for clarification.

## 2024-03-03 NOTE — Assessment & Plan Note (Signed)
 Magnesium  levels have been low on a few occasions. Not currently taking magnesium  supplements at home.  Magnesium  further down at 1.2 today.  She will receive extra magnesium  for total of 4 g of mag sulfate tomorrow during chemotherapy.  She was also started on magnesium  oxide 400 mg p.o. twice daily at home.

## 2024-03-03 NOTE — Assessment & Plan Note (Signed)
 White blood cell count elevated due to booster injection. Expected response to granulocyte colony-stimulating factor.

## 2024-03-03 NOTE — Assessment & Plan Note (Addendum)
 Please review oncology history for additional details and timeline of events.  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 Biopsy from the omentum confirmed adenocarcinoma, originating from the gallbladder.  Immunostains support diagnosis of gallbladder carcinoma.    Previously I discussed diagnosis, staging, prognosis, plan of care, treatment options.  Reviewed NCCN guidelines. Given stage IV disease, all treatment options are palliative in nature and not curative in intent.  This was discussed with patient, her daughter and son-in-law who were accompanying.  They verbalized understanding.    Discussed the aggressive nature of stage IV gallbladder adenocarcinoma and overall poor prognosis.  Life expectancy with treatment is 1 to 1.5 years; without treatment, less than six months.   NGS panel testing on the specimen and also liquid biopsy for mutation analysis showed no actionable mutations.  Staging PET scan on 12/29/2023 showed known gallbladder mass extending into the liver.  Peritoneal carcinomatosis noted.  Numerous lung nodules were identified, small bowel with FDG uptake, concerning for metastatic disease in the lungs.  Metastatic lymphadenopathy was noted in the abdomen and retroperitoneum.  This scan will serve as a good baseline for future compression.  Discussed results with the patient and family members previously.   Plan made to proceed with palliative systemic chemoimmunotherapy with gemcitabine , cisplatin  and durvalumab .  She started this from 01/01/2024.  Tolerating chemo well so far.  No major side effects.  Completed 3 cycles of chemotherapy so far with growth factor support as needed.  Labs today reveal leukocytosis with white count of 19,300, presumably from G-CSF support.  Hemoglobin decreased at 7.4, platelet 133,000.  Low potassium at 3.3 and low magnesium  at 1.2.  She will receive 1 unit of PRBC transfusion today in preparation for chemotherapy  tomorrow.  Otherwise no dose-limiting toxicities.   We will proceed with cycle 4-day 1 of chemotherapy tomorrow as scheduled.  She will receive extra potassium and magnesium  tomorrow.  She was also started on magnesium  oxide 400 mg p.o. twice daily at home.  - Ensure hydration and nutrition are maintained during treatment.  RTC in 1 week for labs, follow-up and continuation of chemotherapy.  Following completion of 4 cycles of chemotherapy, restaging PET scan will be obtained.  Request placed today.

## 2024-03-03 NOTE — Assessment & Plan Note (Signed)
 Hemoglobin decreasing since starting chemotherapy, currently 7.4, down from 8.1 two weeks ago. Reports fatigue, denies chest pain or shortness of breath. Blood transfusion recommended as hemoglobin is below 8, with potential further decline due to chemotherapy.  She was agreeable to proceed with blood transfusion.  She was given 1 unit of PRBC transfusion in clinic today.

## 2024-03-03 NOTE — Patient Instructions (Signed)

## 2024-03-04 ENCOUNTER — Inpatient Hospital Stay: Admitting: Dietician

## 2024-03-04 ENCOUNTER — Ambulatory Visit: Admitting: Pulmonary Disease

## 2024-03-04 ENCOUNTER — Inpatient Hospital Stay

## 2024-03-04 VITALS — BP 136/57 | HR 65 | Temp 98.1°F | Resp 18

## 2024-03-04 DIAGNOSIS — C23 Malignant neoplasm of gallbladder: Secondary | ICD-10-CM

## 2024-03-04 DIAGNOSIS — Z5112 Encounter for antineoplastic immunotherapy: Secondary | ICD-10-CM | POA: Diagnosis not present

## 2024-03-04 LAB — TYPE AND SCREEN
ABO/RH(D): B POS
Antibody Screen: NEGATIVE
Unit division: 0

## 2024-03-04 LAB — BPAM RBC
Blood Product Expiration Date: 202509122359
ISSUE DATE / TIME: 202508211219
Unit Type and Rh: 1700

## 2024-03-04 MED ORDER — POTASSIUM CHLORIDE CRYS ER 20 MEQ PO TBCR
20.0000 meq | EXTENDED_RELEASE_TABLET | Freq: Once | ORAL | Status: AC
  Administered 2024-03-04: 20 meq via ORAL
  Filled 2024-03-04: qty 1

## 2024-03-04 MED ORDER — POTASSIUM CHLORIDE IN NACL 20-0.9 MEQ/L-% IV SOLN
Freq: Once | INTRAVENOUS | Status: AC
  Filled 2024-03-04: qty 1000

## 2024-03-04 MED ORDER — PALONOSETRON HCL INJECTION 0.25 MG/5ML
0.2500 mg | Freq: Once | INTRAVENOUS | Status: AC
  Administered 2024-03-04: 0.25 mg via INTRAVENOUS
  Filled 2024-03-04 (×2): qty 5

## 2024-03-04 MED ORDER — SODIUM CHLORIDE 0.9 % IV SOLN
1500.0000 mg | Freq: Once | INTRAVENOUS | Status: AC
  Administered 2024-03-04: 1500 mg via INTRAVENOUS
  Filled 2024-03-04: qty 30

## 2024-03-04 MED ORDER — SODIUM CHLORIDE 0.9 % IV SOLN: INTRAVENOUS | Status: DC

## 2024-03-04 MED ORDER — DEXAMETHASONE SODIUM PHOSPHATE 10 MG/ML IJ SOLN
10.0000 mg | Freq: Once | INTRAMUSCULAR | Status: AC
  Administered 2024-03-04: 10 mg via INTRAVENOUS
  Filled 2024-03-04: qty 1

## 2024-03-04 MED ORDER — MAGNESIUM SULFATE 4 GM/100ML IV SOLN
4.0000 g | Freq: Once | INTRAVENOUS | Status: AC
Start: 2024-03-04 — End: 2024-03-04
  Administered 2024-03-04: 4 g via INTRAVENOUS
  Filled 2024-03-04: qty 100

## 2024-03-04 MED ORDER — SODIUM CHLORIDE 0.9% FLUSH: 10.0000 mL | INTRAVENOUS | Status: DC | PRN

## 2024-03-04 MED ORDER — APREPITANT 130 MG/18ML IV EMUL
130.0000 mg | Freq: Once | INTRAVENOUS | Status: AC
  Administered 2024-03-04: 130 mg via INTRAVENOUS
  Filled 2024-03-04: qty 18

## 2024-03-04 MED ORDER — SODIUM CHLORIDE 0.9 % IV SOLN
25.0000 mg/m2 | Freq: Once | INTRAVENOUS | Status: AC
  Administered 2024-03-04: 50 mg via INTRAVENOUS
  Filled 2024-03-04: qty 50

## 2024-03-04 MED ORDER — SODIUM CHLORIDE 0.9 % IV SOLN
1000.0000 mg/m2 | Freq: Once | INTRAVENOUS | Status: AC
  Administered 2024-03-04: 1786 mg via INTRAVENOUS
  Filled 2024-03-04: qty 46.97

## 2024-03-04 NOTE — Progress Notes (Signed)
 Nutrition Follow-up:  Pt with stage IV bladder cancer mets to peritoneum and lungs. She is receiving palliative chemoimmunotherapy with cisplatin /gemcitabine  + imfinzi  (first 6/20). Patient is under the care of Dr. Autumn   Pt receiving IV electrolyte repletion in addition to treatment  Met with patient in infusion. She reports improved fatigue s/p PRBC yesterday. This made a big difference. Patient reports appetite is about the same. Some days are better than others. Taking megace  as prescribed. She continues to crave [employer] chicken and eats this often. Patient is hungry at visit. Would like tomato soup/crackers. Patient denies nausea/vomiting. She is taking miralax  for constipation. Last BM was 4 days ago.   Medications: reviewed   Labs: 8/21 - Hgb 7.4 (s/p 1 unit PRBC 8/21), Mg 1.2, K 3.3, glucose 105, albumin  3.4  Anthropometrics: Wt 165 lb 5 oz  7/31 - 166 lb 12.8 oz  7/16 - 156 lb 1.6 oz  7/10 - 162 lb  6/26 - 164 lb 14.4 oz 6/3 - 169 lb 15.6 oz   NUTRITION DIAGNOSIS: Food and nutrition related knowledge deficit improved    INTERVENTION:  Continue megace  for appetite per MD Encourage high calorie high protein foods for wt maintenance  Continue miralax  for constipation - pt planning to take this when she gets home Handouts for anemia + foods with mg provided RD warmed soup and provided crackers - pt appreciative     MONITORING, EVALUATION, GOAL: wt trends, intake   NEXT VISIT: Friday September 19 during infusion

## 2024-03-04 NOTE — Progress Notes (Signed)
 Notes from Dr. Autumn from 03/03/24 indicate to go forward with treatment today- patient received a unit of blood yesterday for a hemoglobin of 7.4 and will receieve extra magnesium  during today's treatment (4gm total).

## 2024-03-05 ENCOUNTER — Emergency Department (HOSPITAL_COMMUNITY)
Admission: EM | Admit: 2024-03-05 | Discharge: 2024-03-05 | Disposition: A | Discharge status: Home / Self Care | Attending: Emergency Medicine | Admitting: Emergency Medicine

## 2024-03-05 ENCOUNTER — Other Ambulatory Visit: Payer: Self-pay

## 2024-03-05 ENCOUNTER — Encounter (HOSPITAL_COMMUNITY): Payer: Self-pay

## 2024-03-05 DIAGNOSIS — Z7982 Long term (current) use of aspirin: Secondary | ICD-10-CM | POA: Insufficient documentation

## 2024-03-05 DIAGNOSIS — L538 Other specified erythematous conditions: Secondary | ICD-10-CM | POA: Insufficient documentation

## 2024-03-05 DIAGNOSIS — L539 Erythematous condition, unspecified: Secondary | ICD-10-CM

## 2024-03-05 DIAGNOSIS — R220 Localized swelling, mass and lump, head: Secondary | ICD-10-CM | POA: Diagnosis present

## 2024-03-05 LAB — CBC WITH DIFFERENTIAL/PLATELET
Abs Immature Granulocytes: 0.21 K/uL — ABNORMAL HIGH (ref 0.00–0.07)
Basophils Absolute: 0 K/uL (ref 0.0–0.1)
Basophils Relative: 0 %
Eosinophils Absolute: 0 K/uL (ref 0.0–0.5)
Eosinophils Relative: 0 %
HCT: 27.9 % — ABNORMAL LOW (ref 36.0–46.0)
Hemoglobin: 9.1 g/dL — ABNORMAL LOW (ref 12.0–15.0)
Immature Granulocytes: 1 %
Lymphocytes Relative: 3 %
Lymphs Abs: 0.5 K/uL — ABNORMAL LOW (ref 0.7–4.0)
MCH: 31.4 pg (ref 26.0–34.0)
MCHC: 32.6 g/dL (ref 30.0–36.0)
MCV: 96.2 fL (ref 80.0–100.0)
Monocytes Absolute: 1.4 K/uL — ABNORMAL HIGH (ref 0.1–1.0)
Monocytes Relative: 7 %
Neutro Abs: 17.8 K/uL — ABNORMAL HIGH (ref 1.7–7.7)
Neutrophils Relative %: 89 %
Platelets: 182 K/uL (ref 150–400)
RBC: 2.9 MIL/uL — ABNORMAL LOW (ref 3.87–5.11)
RDW: 21.5 % — ABNORMAL HIGH (ref 11.5–15.5)
WBC: 19.9 K/uL — ABNORMAL HIGH (ref 4.0–10.5)
nRBC: 0 % (ref 0.0–0.2)

## 2024-03-05 LAB — COMPREHENSIVE METABOLIC PANEL WITH GFR
ALT: 10 U/L (ref 0–44)
AST: 18 U/L (ref 15–41)
Albumin: 3.1 g/dL — ABNORMAL LOW (ref 3.5–5.0)
Alkaline Phosphatase: 80 U/L (ref 38–126)
Anion gap: 10 (ref 5–15)
BUN: 14 mg/dL (ref 8–23)
CO2: 20 mmol/L — ABNORMAL LOW (ref 22–32)
Calcium: 8.1 mg/dL — ABNORMAL LOW (ref 8.9–10.3)
Chloride: 110 mmol/L (ref 98–111)
Creatinine, Ser: 0.89 mg/dL (ref 0.44–1.00)
GFR, Estimated: >60 mL/min (ref >60)
Glucose, Bld: 117 mg/dL — ABNORMAL HIGH (ref 70–99)
Potassium: 4.4 mmol/L (ref 3.5–5.1)
Sodium: 140 mmol/L (ref 135–145)
Total Bilirubin: 1 mg/dL (ref 0.0–1.2)
Total Protein: 5.9 g/dL — ABNORMAL LOW (ref 6.5–8.1)

## 2024-03-05 MED ORDER — METHYLPREDNISOLONE 4 MG PO TBPK
ORAL_TABLET | ORAL | 0 refills | Status: DC
Start: 2024-03-05 — End: 2024-03-11

## 2024-03-05 NOTE — ED Triage Notes (Signed)
 Pt presents to ED from home C/O facial swelling since waking this AM. Pt reports no hx of same. Being treated for gallbladder cancer, last chemo yesterday. Also received blood 2 days ago. Denies SOB, CP, swelling anywhere other than face, or rash. Speaking in clear sentences during triage.

## 2024-03-05 NOTE — Discharge Instructions (Signed)
 Please return for rapid spreading difficulty breathing or swallowing or if you develop nausea vomiting or diarrhea or feel you are going to pass out.  Please return for fever.

## 2024-03-05 NOTE — ED Provider Notes (Signed)
 [place] EMERGENCY DEPARTMENT AT [facility] Provider Note   CSN: [id number] Arrival date & time: 03/05/24  1048     Patient presents with: Facial Swelling   Janice Pace is a 78 y.o. female.   78 yo F with a chief complaints of facial erythema and edema.  Woke up with this morning.  Patient getting chemotherapy for stage IV gallbladder cancer.  She has otherwise been doing okay.  No difficulty breathing no nausea vomiting or diarrhea.  Feels like she has been eating and drinking well.        Prior to Admission medications   Medication Sig Start Date End Date Taking? Authorizing Provider  methylPREDNISolone  (MEDROL  DOSEPAK) 4 MG TBPK tablet Day 1: 8mg  before breakfast, 4 mg after lunch, 4 mg after supper, and 8 mg at bedtime Day 2: 4 mg before breakfast, 4 mg after lunch, 4 mg  after supper, and 8 mg  at bedtime Day 3:  4 mg  before breakfast, 4 mg  after lunch, 4 mg after supper, and 4 mg  at bedtime Day 4: 4 mg  before breakfast, 4 mg  after lunch, and 4 mg at bedtime Day 5: 4 mg  before breakfast and 4 mg at bedtime Day 6: 4 mg  before breakfast 03/05/24  Yes Emil Share, DO  albuterol  (VENTOLIN  HFA) 108 (90 Base) MCG/ACT inhaler Inhale 1-2 puffs into the lungs every 6 (six) hours as needed for wheezing or shortness of breath. 05/06/22   Cobb, Comer GAILS, NP  aspirin  81 MG EC tablet Take 81 mg by mouth daily.    [provider]  budesonide  (PULMICORT ) 0.5 MG/2ML nebulizer solution Generic for Pulmicort . Inhale one vial in nebulizer twice a day. Rinse mouth after use. 02/08/24   Kara Dorn NOVAK, MD  dexamethasone  (DECADRON ) 4 MG tablet Take 2 tablets (8 mg) by mouth daily x 3 days starting the day after cisplatin  chemotherapy. Take with food. 12/24/23   Pasam, Chinita, MD  docusate sodium  (COLACE) 100 MG capsule Take 500 mg by mouth at bedtime.    [provider]  formoterol  (PERFOROMIST ) 20 MCG/2ML nebulizer solution Substituted for: Perforomist   Solution Inhale one vial in nebulizer twice a day. 02/08/24   Kara Dorn NOVAK, MD  lidocaine -prilocaine  (EMLA ) cream Apply to affected area once 12/24/23   Pasam, Avinash, MD  magnesium  oxide (MAG-OX) 400 (240 Mg) MG tablet Take 1 tablet (400 mg total) by mouth 2 (two) times daily. 03/03/24   Pasam, Chinita, MD  megestrol  (MEGACE  ES) 625 MG/5ML suspension Take 5 mLs (625 mg total) by mouth daily. 01/27/24   Pasam, Chinita, MD  metoprolol  succinate (TOPROL -XL) 50 MG 24 hr tablet TAKE 1 TABLET BY MOUTH ONCE DAILY. TAKE WITH OR IMMEDIATELY FOLLOWING A MEAL 11/11/23   Chandra Toribio POUR, MD  morphine  (MS CONTIN ) 15 MG 12 hr tablet Take 1 tablet (15 mg total) by mouth every 12 (twelve) hours. 02/04/24   Burton, Lacie K, NP  omeprazole  (PRILOSEC) 20 MG capsule Take 1 capsule (20 mg total) by mouth 2 (two) times daily before a meal. 12/22/23   Clapp, Kara F, PA-C  ondansetron  (ZOFRAN ) 4 MG tablet Take 1 tablet (4 mg total) by mouth every 6 (six) hours as needed for nausea. 12/16/23   Laurence Locus, DO  ondansetron  (ZOFRAN ) 8 MG tablet Take 1 tablet (8 mg total) by mouth every 8 (eight) hours as needed for nausea or vomiting. Start on the third day after cisplatin . 12/24/23  Pasam, Avinash, MD  oxyCODONE  10 MG TABS Take 1-1.5 tablets (10-15 mg total) by mouth every 4 (four) hours as needed for moderate pain (pain score 4-6) or severe pain (pain score 7-10). Patient taking differently: Take 10-15 mg by mouth every 4 (four) hours as needed for moderate pain (pain score 4-6) or severe pain (pain score 7-10). Pt reports she takes this once in awhile 12/16/23   Laurence Locus, DO  polyethylene glycol (MIRALAX  / GLYCOLAX ) 17 g packet Take 17 g by mouth 2 (two) times daily.    [provider]  prochlorperazine  (COMPAZINE ) 10 MG tablet Take 1 tablet (10 mg total) by mouth every 6 (six) hours as needed (Nausea or vomiting). 12/24/23   Pasam, Chinita, MD  revefenacin  (YUPELRI ) 175 MCG/3ML nebulizer solution Inhale one vial in  nebulizer once daily. Do not mix with other nebulized medications. 02/08/24   Kara Dorn NOVAK, MD  rosuvastatin  (CRESTOR ) 10 MG tablet Take 1 tablet by mouth once daily 02/18/24   Gayle Numbers F, PA-C  spironolactone  (ALDACTONE ) 25 MG tablet Take 25 mg by mouth daily. 02/08/24   [provider]    Allergies: Sulfa antibiotics, Forteo  [parathyroid hormone (recomb)], Atorvastatin, and Metformin hcl    Review of Systems  Updated Vital Signs BP (!) 176/78 (BP Location: Left Arm)   Pulse 77   Temp 97.6 F (36.4 C) (Oral)   Resp 18   Ht 5' 2 (1.575 m)   Wt 74.8 kg   SpO2 100%   BMI 30.18 kg/m   Physical Exam Vitals and nursing note reviewed.  Constitutional:      General: She is not in acute distress.    Appearance: She is well-developed. She is not diaphoretic.  HENT:     Head: Normocephalic and atraumatic.     Comments: Erythematous rash to the face.  Some bulla to the right cheek.  Spares the rest of the body. Eyes:     Pupils: Pupils are equal, round, and reactive to light.  Cardiovascular:     Rate and Rhythm: Normal rate and regular rhythm.     Heart sounds: No murmur heard.    No friction rub. No gallop.  Pulmonary:     Effort: Pulmonary effort is normal.     Breath sounds: No wheezing or rales.  Abdominal:     General: There is no distension.     Palpations: Abdomen is soft.     Tenderness: There is no abdominal tenderness.  Musculoskeletal:        General: No tenderness.     Cervical back: Normal range of motion and neck supple.  Skin:    General: Skin is warm and dry.  Neurological:     Mental Status: She is alert and oriented to person, place, and time.  Psychiatric:        Behavior: Behavior normal.     (all labs ordered are listed, but only abnormal results are displayed) Labs Reviewed  COMPREHENSIVE METABOLIC PANEL WITH GFR - Abnormal; Notable for the following components:      Result Value   CO2 20 (*)    Glucose, Bld 117 (*)    Calcium  8.1  (*)    Total Protein 5.9 (*)    Albumin  3.1 (*)    All other components within normal limits  CBC WITH DIFFERENTIAL/PLATELET - Abnormal; Notable for the following components:   WBC 19.9 (*)    RBC 2.90 (*)    Hemoglobin 9.1 (*)  HCT 27.9 (*)    RDW 21.5 (*)    Neutro Abs 17.8 (*)    Lymphs Abs 0.5 (*)    Monocytes Absolute 1.4 (*)    Abs Immature Granulocytes 0.21 (*)    All other components within normal limits    EKG: None  Radiology: No results found.   Procedures   Medications Ordered in the ED - No data to display                                  Medical Decision Making Amount and/or Complexity of Data Reviewed Labs: ordered.  Risk Prescription drug management.   78 yo F with a chief complaints of a rash to the face.  She woke up with it this morning.  She is currently getting palliative chemotherapy for stage IV gallbladder adenocarcinoma.  On record review it looks like the patient got durvalumab , gemcitabine , and cisplatin  yesterday.  She also required blood transfusion.  She also got steroids and aprepitant  and palonosentron.  I discussed case with Dr. Tina, oncology based on my description and history of the patient and he recommended a Medrol  Dosepak and follow-up with oncology in clinic.  12:07 PM:  I have discussed the diagnosis/risks/treatment options with the patient.  Evaluation and diagnostic testing in the emergency department does not suggest an emergent condition requiring admission or immediate intervention beyond what has been performed at this time.  They will follow up with Oncology, PCP. We also discussed returning to the ED immediately if new or worsening sx occur. We discussed the sx which are most concerning (e.g., sudden worsening pain, fever, inability to tolerate by mouth, rapid spreading, fever, difficulty breathing, swallowin) that necessitate immediate return. Medications administered to the patient during their visit and any new  prescriptions provided to the patient are listed below.  Medications given during this visit Medications - No data to display   The patient appears reasonably screen and/or stabilized for discharge and I doubt any other medical condition or other [facility] requiring further screening, evaluation, or treatment in the ED at this time prior to discharge.       Final diagnoses:  Facial erythema    ED Discharge Orders          Ordered    methylPREDNISolone  (MEDROL  DOSEPAK) 4 MG TBPK tablet        03/05/24 1205               Emil Share, DO 03/05/24 1207

## 2024-03-05 NOTE — ED Notes (Signed)
 AVS with prescriptions provided to and discussed with patient and family member at bedside. Pt verbalizes understanding of discharge instructions and denies any questions or concerns at this time. Pt has ride home. Pt ambulated out of department independently with steady gait.

## 2024-03-07 ENCOUNTER — Other Ambulatory Visit: Payer: Self-pay | Admitting: Oncology

## 2024-03-07 ENCOUNTER — Other Ambulatory Visit (HOSPITAL_COMMUNITY): Payer: Self-pay

## 2024-03-07 DIAGNOSIS — G893 Neoplasm related pain (acute) (chronic): Secondary | ICD-10-CM

## 2024-03-08 ENCOUNTER — Other Ambulatory Visit (HOSPITAL_COMMUNITY): Payer: Self-pay

## 2024-03-08 ENCOUNTER — Encounter: Payer: Self-pay | Admitting: Oncology

## 2024-03-08 MED ORDER — MORPHINE SULFATE ER 15 MG PO TBCR
15.0000 mg | EXTENDED_RELEASE_TABLET | Freq: Two times a day (BID) | ORAL | 0 refills | Status: DC
  Filled 2024-03-08: qty 60, 30d supply, fill #0

## 2024-03-09 ENCOUNTER — Other Ambulatory Visit: Payer: Self-pay

## 2024-03-11 ENCOUNTER — Other Ambulatory Visit: Payer: Self-pay

## 2024-03-11 ENCOUNTER — Inpatient Hospital Stay: Admitting: Oncology

## 2024-03-11 ENCOUNTER — Inpatient Hospital Stay

## 2024-03-11 ENCOUNTER — Encounter: Payer: Self-pay | Admitting: Oncology

## 2024-03-11 ENCOUNTER — Other Ambulatory Visit (HOSPITAL_COMMUNITY): Payer: Self-pay

## 2024-03-11 VITALS — BP 167/53 | HR 69 | Temp 98.0°F | Resp 16 | Ht 62.0 in | Wt 164.0 lb

## 2024-03-11 DIAGNOSIS — Z5112 Encounter for antineoplastic immunotherapy: Secondary | ICD-10-CM | POA: Diagnosis not present

## 2024-03-11 DIAGNOSIS — C23 Malignant neoplasm of gallbladder: Secondary | ICD-10-CM

## 2024-03-11 DIAGNOSIS — D6481 Anemia due to antineoplastic chemotherapy: Secondary | ICD-10-CM

## 2024-03-11 DIAGNOSIS — T451X5A Adverse effect of antineoplastic and immunosuppressive drugs, initial encounter: Secondary | ICD-10-CM | POA: Diagnosis not present

## 2024-03-11 LAB — CBC WITH DIFFERENTIAL (CANCER CENTER ONLY)
Abs Immature Granulocytes: 0.02 K/uL (ref 0.00–0.07)
Basophils Absolute: 0 K/uL (ref 0.0–0.1)
Basophils Relative: 0 %
Eosinophils Absolute: 0 K/uL (ref 0.0–0.5)
Eosinophils Relative: 0 %
HCT: 24.5 % — ABNORMAL LOW (ref 36.0–46.0)
Hemoglobin: 8.4 g/dL — ABNORMAL LOW (ref 12.0–15.0)
Immature Granulocytes: 0 %
Lymphocytes Relative: 17 %
Lymphs Abs: 0.8 K/uL (ref 0.7–4.0)
MCH: 32.2 pg (ref 26.0–34.0)
MCHC: 34.3 g/dL (ref 30.0–36.0)
MCV: 93.9 fL (ref 80.0–100.0)
Monocytes Absolute: 0.3 K/uL (ref 0.1–1.0)
Monocytes Relative: 6 %
Neutro Abs: 3.8 K/uL (ref 1.7–7.7)
Neutrophils Relative %: 77 %
Platelet Count: 112 K/uL — ABNORMAL LOW (ref 150–400)
RBC: 2.61 MIL/uL — ABNORMAL LOW (ref 3.87–5.11)
RDW: 19.1 % — ABNORMAL HIGH (ref 11.5–15.5)
WBC Count: 5 K/uL (ref 4.0–10.5)
nRBC: 0 % (ref 0.0–0.2)

## 2024-03-11 LAB — CMP (CANCER CENTER ONLY)
ALT: 28 U/L (ref 0–44)
AST: 28 U/L (ref 15–41)
Albumin: 3.5 g/dL (ref 3.5–5.0)
Alkaline Phosphatase: 84 U/L (ref 38–126)
Anion gap: 6 (ref 5–15)
BUN: 15 mg/dL (ref 8–23)
CO2: 24 mmol/L (ref 22–32)
Calcium: 8.4 mg/dL — ABNORMAL LOW (ref 8.9–10.3)
Chloride: 109 mmol/L (ref 98–111)
Creatinine: 0.96 mg/dL (ref 0.44–1.00)
GFR, Estimated: >60 mL/min (ref >60)
Glucose, Bld: 110 mg/dL — ABNORMAL HIGH (ref 70–99)
Potassium: 4.4 mmol/L (ref 3.5–5.1)
Sodium: 139 mmol/L (ref 135–145)
Total Bilirubin: 0.5 mg/dL (ref 0.0–1.2)
Total Protein: 5.7 g/dL — ABNORMAL LOW (ref 6.5–8.1)

## 2024-03-11 LAB — MAGNESIUM: Magnesium: 1.4 mg/dL — ABNORMAL LOW (ref 1.7–2.4)

## 2024-03-11 MED ORDER — APREPITANT 130 MG/18ML IV EMUL
130.0000 mg | Freq: Once | INTRAVENOUS | Status: AC
  Administered 2024-03-11: 130 mg via INTRAVENOUS
  Filled 2024-03-11: qty 18

## 2024-03-11 MED ORDER — PALONOSETRON HCL INJECTION 0.25 MG/5ML
0.2500 mg | Freq: Once | INTRAVENOUS | Status: AC
  Administered 2024-03-11: 0.25 mg via INTRAVENOUS
  Filled 2024-03-11: qty 5

## 2024-03-11 MED ORDER — SODIUM CHLORIDE 0.9 % IV SOLN
INTRAVENOUS | Status: DC
Start: 2024-03-11 — End: 2024-03-11

## 2024-03-11 MED ORDER — MAGNESIUM SULFATE 2 GM/50ML IV SOLN
2.0000 g | Freq: Once | INTRAVENOUS | Status: AC
  Administered 2024-03-11: 2 g via INTRAVENOUS
  Filled 2024-03-11: qty 50

## 2024-03-11 MED ORDER — DEXAMETHASONE SODIUM PHOSPHATE 10 MG/ML IJ SOLN
10.0000 mg | Freq: Once | INTRAMUSCULAR | Status: AC
  Administered 2024-03-11: 10 mg via INTRAVENOUS
  Filled 2024-03-11: qty 1

## 2024-03-11 MED ORDER — METHYLPREDNISOLONE 4 MG PO TBPK
ORAL_TABLET | ORAL | 1 refills | Status: DC
  Filled 2024-03-11: qty 21, 6d supply, fill #0
  Filled 2024-04-11 – 2024-04-12 (×2): qty 21, 6d supply, fill #1

## 2024-03-11 MED ORDER — SODIUM CHLORIDE 0.9 % IV SOLN
25.0000 mg/m2 | Freq: Once | INTRAVENOUS | Status: AC
  Administered 2024-03-11: 50 mg via INTRAVENOUS
  Filled 2024-03-11: qty 50

## 2024-03-11 MED ORDER — POTASSIUM CHLORIDE IN NACL 20-0.9 MEQ/L-% IV SOLN
Freq: Once | INTRAVENOUS | Status: AC
  Filled 2024-03-11: qty 1000

## 2024-03-11 MED ORDER — SODIUM CHLORIDE 0.9 % IV SOLN
1000.0000 mg/m2 | Freq: Once | INTRAVENOUS | Status: AC
  Administered 2024-03-11: 1786 mg via INTRAVENOUS
  Filled 2024-03-11: qty 46.97

## 2024-03-11 NOTE — Assessment & Plan Note (Addendum)
 Hemoglobin level is 8.4, which is at a safe level to proceed with chemotherapy. No current need for booster injection as white blood cell count is at a safe level. Last booster shot increased white count to 19,000, currently at 5,000. - Proceed with chemotherapy - Skip booster injection for white blood cell count

## 2024-03-11 NOTE — Progress Notes (Signed)
 Per Dr. Autumn ok to give an additional 2g of Magnesium  sulfate today (for a total of 4g).   Alfonso MARLA Buys, PharmD Pharmacy Resident  03/11/2024 10:11 AM

## 2024-03-11 NOTE — Progress Notes (Signed)
 Spoke with patient about the importance of taking her magnesium  as prescribed and to have it refilled whenever necessary. Patient currently getting Magnesium  IV in infusion. This was pointed out to patient and she agreed to make a big effort to keep up with her magnesium  intake as prescribed.

## 2024-03-11 NOTE — Progress Notes (Signed)
 Patient seen by Dr. Chinita Pasam today  Vitals are within treatment parameters:Yes   Labs are within treatment parameters: No (Please specify and give further instructions.)  Magnesium : 1.4  Treatment plan has been signed: Yes   Per physician team, Patient is ready for treatment. Please note the following modifications:   Magnesium  replacement per Dr. Autumn.

## 2024-03-11 NOTE — Assessment & Plan Note (Addendum)
 Magnesium  level is low at 1.4. She is taking magnesium  supplements twice daily at home. Will receive additional magnesium  with chemotherapy fluids today. - Continue magnesium  supplements twice daily - Administer magnesium  with chemotherapy fluids

## 2024-03-11 NOTE — Assessment & Plan Note (Addendum)
 Please review oncology history for additional details and timeline of events.  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 Biopsy from the omentum confirmed adenocarcinoma, originating from the gallbladder.  Immunostains support diagnosis of gallbladder carcinoma.    Previously I discussed diagnosis, staging, prognosis, plan of care, treatment options.  Reviewed NCCN guidelines. Given stage IV disease, all treatment options are palliative in nature and not curative in intent.  This was discussed with patient, her daughter and son-in-law who were accompanying.  They verbalized understanding.    Discussed the aggressive nature of stage IV gallbladder adenocarcinoma and overall poor prognosis.  Life expectancy with treatment is 1 to 1.5 years; without treatment, less than six months.   NGS panel testing on the specimen and also liquid biopsy for mutation analysis showed no actionable mutations.  Staging PET scan on 12/29/2023 showed known gallbladder mass extending into the liver.  Peritoneal carcinomatosis noted.  Numerous lung nodules were identified, small bowel with FDG uptake, concerning for metastatic disease in the lungs.  Metastatic lymphadenopathy was noted in the abdomen and retroperitoneum.  This scan will serve as a good baseline for future compression.  Discussed results with the patient and family members previously.   Plan made to proceed with palliative systemic chemoimmunotherapy with gemcitabine , cisplatin  and durvalumab .  She started this from 01/01/2024.  Tolerating chemo well so far.  No major side effects.  After her last chemotherapy, she presented to the ED with complaints of facial swelling and redness.  It resolved with Medrol  Dosepak.  Symptoms lasted for 1 to 2 days.  Of note, patient also received her first ever blood transfusion around the same time as last chemotherapy.  Due for cycle 4-day 8 of chemotherapy today.  Labs reveal no dose-limiting  toxicities.  Will proceed with chemotherapy as scheduled.  We will skip G-CSF support with current cycle.  She was given extra magnesium  in clinic today and advised to continue oral magnesium  at home.  Restaging PET scan is currently scheduled on 03/17/2024.  We will discuss results on return visit in 2 weeks.

## 2024-03-11 NOTE — Progress Notes (Signed)
 [facility]  ONCOLOGY CLINIC PROGRESS NOTE   Patient Care Team: [name] as PCP - General (Physician Assistant) Pcp, [name], DO (Family Medicine) [place], [facility] [name], RN as Oncology Nurse Navigator  PATIENT NAME: Janice Pace   MR#: [id number] DOB: 1945/12/28  Date of visit: 03/11/2024   ASSESSMENT & PLAN:   [name] Pace is a 78 y.o. 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   Adenocarcinoma of gallbladder [facility]) Please review oncology history for additional details and timeline of events.  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 Biopsy from the omentum confirmed adenocarcinoma, originating from the gallbladder.  Immunostains support diagnosis of gallbladder carcinoma.    Previously I discussed diagnosis, staging, prognosis, plan of care, treatment options.  Reviewed NCCN guidelines. Given stage IV disease, all treatment options are palliative in nature and not curative in intent.  This was discussed with patient, her daughter and son-in-law who were accompanying.  They verbalized understanding.    Discussed the aggressive nature of stage IV gallbladder adenocarcinoma and overall poor prognosis.  Life expectancy with treatment is 1 to 1.5 years; without treatment, less than six months.   NGS panel testing on the specimen and also liquid biopsy for mutation analysis showed no actionable mutations.  Staging PET scan on 12/29/2023 showed known gallbladder mass extending into the liver.  Peritoneal carcinomatosis noted.  Numerous lung nodules were identified, small bowel with FDG uptake, concerning for metastatic disease in the lungs.   Metastatic lymphadenopathy was noted in the abdomen and retroperitoneum.  This scan will serve as a good baseline for future compression.  Discussed results with the patient and family members previously.   Plan made to proceed with palliative systemic chemoimmunotherapy with gemcitabine , cisplatin  and durvalumab .  She started this from 01/01/2024.  Tolerating chemo well so far.  No major side effects.  After her last chemotherapy, she presented to the ED with complaints of facial swelling and redness.  It resolved with Medrol  Dosepak.  Symptoms lasted for 1 to 2 days.  Of note, patient also received her first ever blood transfusion around the same time as last chemotherapy.  Due for cycle 4-day 8 of chemotherapy today.  Labs reveal no dose-limiting toxicities.  Will proceed with chemotherapy as scheduled.  We will skip G-CSF support with current cycle.  She was given extra magnesium  in clinic today and advised to continue oral magnesium  at home.  Restaging PET scan is currently scheduled on 03/17/2024.  We will discuss results on return visit in 2 weeks.  Anemia due to antineoplastic chemotherapy Hemoglobin level is 8.4, which is at a safe level to proceed with chemotherapy. No current need for booster injection as white blood cell count is at a safe level. Last booster shot increased white count to 19,000, currently at 5,000. - Proceed with chemotherapy - Skip booster injection for white blood cell count  Hypomagnesemia Magnesium  level is low at 1.4. She is taking magnesium  supplements twice daily at home. Will receive additional magnesium  with chemotherapy fluids today. - Continue magnesium  supplements twice daily - Administer magnesium  with chemotherapy fluids  Facial swelling and erythema likely drug or transfusion reaction Recent episode of facial swelling and erythema, possibly related to chemotherapy  or recent blood transfusion. Symptoms resolved with Medrol  dose pack. No itching or other  symptoms reported. Plan to add extra pre-medications before chemotherapy to prevent recurrence. Medrol  dose pack prescribed for future use if needed. - Prescribe Medrol  dose pack for future use if needed - Send prescription to local pharmacy - Add extra pre-medications before chemotherapy to prevent reaction  Chemotherapy-induced fatigue Experiencing fatigue, which is common with chemotherapy. No major issues with energy levels, but she reports feeling tired and needing to rest frequently. - Encourage rest and activity as tolerated  I reviewed lab results and outside records for this visit and discussed relevant results with the patient. Diagnosis, plan of care and treatment options were also discussed in detail with the patient. Opportunity provided to ask questions and answers provided to her apparent satisfaction. Provided instructions to call our clinic with any problems, questions or concerns prior to return visit. I recommended to continue follow-up with PCP and sub-specialists. She verbalized understanding and agreed with the plan.   NCCN guidelines have been consulted in the planning of this patient's care.  I spent a total of 40 minutes during this encounter with the patient including review of chart and various tests results, discussions about plan of care and coordination of care plan.   Janice Patten, MD  03/11/2024 5:10 PM  [facility] - A DEPT OF [facility] [address] [place] [address] Dept: [phone] Dept Fax: [phone]    CHIEF COMPLAINT/ REASON FOR VISIT:   Stage IV gallbladder adenocarcinoma with peritoneal mets and lung mets.  Current Treatment: Palliative systemic chemoimmunotherapy with cisplatin , gemcitabine , durvalumab  started from 01/01/2024.  INTERVAL HISTORY:    Discussed the use of AI scribe software for clinical note transcription with the patient, who gave verbal consent to  proceed.  History of Present Illness  She experienced facial redness and swelling for two days after last chemotherapy/ blood transfusion. She completed a five-day course of Medrol , a steroid, which concluded yesterday, leading to improvement in the redness by late Saturday evening and significant clearing by Sunday. No itching is associated with the redness and swelling.  She recently received her first blood transfusion around the same time as the onset of her symptoms. She has been undergoing chemotherapy for some time without previous similar reactions. She is currently receiving Decadron , a steroid, as part of her treatment regimen.  She reports that her pain is pretty much under control. No trouble breathing, chest pain, dizziness, or vision problems. Her appetite is fair, and she experiences some nausea and vomiting. She is taking magnesium  supplements twice a day at home due to low magnesium  levels.  She describes feeling fatigued and tries to rest as much as possible. She is currently on her fourth cycle of chemotherapy.    I have reviewed the past medical history, past surgical history, social history and family history with the patient and they are unchanged from previous note.  HISTORY OF PRESENT ILLNESS:   ONCOLOGY HISTORY:   78 y.o. lady with past medical history of essential hypertension, osteoarthritis, COPD/asthma, GERD, depression, anxiety, hiatal hernia, chronic back pain presented to the ED on 12/14/2023 with complaints of worsening epigastric/abdominal pain over 2-3 weeks.  Routine blood work was unremarkable in the ER.  However CT abdomen and pelvis showed irregular appearance of the gallbladder with suggestion of capsular thickening along the fundus of the gallbladder.  Gallbladder appeared inseparable from the adjacent portion  of the liver.  Clinical picture was concerning for gallbladder carcinoma.  Mesenteric nodular thickening along the mid lower abdominal cavity  indicating peritoneal carcinomatosis.  Soft tissue nodular mass in the left lower quadrant region of the pelvis measuring 3.6 x 2.7 cm, likely ovarian neoplastic lesion.  Multiple lung nodules in the lower lobes bilaterally, largest measuring 12 mm in the right lower lobe, concerning for pulmonary metastatic disease.   She was admitted to the hospital for further evaluation and management.  IR was consulted for peritoneal mass biopsy.  CA 19-9 was significant elevated at 14,368.  CEA was increased at 98.7, CA-125 was also increased at 91.3.   On 12/14/2023, MRI abdomen/MRCP showed thickened, contracted gallbladder with a heterogeneous mass arising from the gallbladder fundus, contiguous with the adjacent liver parenchyma of hepatic segment IVB measuring 3.5 x 2.8 cm. Multiple small rim enhancing lesions within the liver parenchyma. Enlarged, necrotic appearing portacaval and porta hepatis lymph nodes. Small, although rim enhancing and abnormal appearing retroperitoneal lymph nodes. Peritoneal stranding and nodularity, incompletely visualized on this examination although present in the left upper quadrant, consistent with peritoneal metastatic disease. Numerous small bilateral pulmonary nodules, better assessed by CT. 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   On 12/15/2023, she underwent CT-guided biopsy of the omental lesion.  Pathology showed metastatic well to moderately differentiated adenocarcinoma. The tumor is positive for cytokeratin 7 and shows focal weak positivity for the GI marker CDX2.  The tumor is negative for the GI markers cytokeratin 20.  The tumor is also negative for the GU and GYN marker PAX8.  The tumor is negative for the pulmonary adeno marker TTF-1. This immunohistochemical pattern and histomorphology would be compatible with the clinical suspicion of a gallbladder primary.  The differential diagnosis would also include an  upper GI or pancreaticobiliary primary.    Patient presented to our clinic on 12/24/2023 to establish care with us .  Request placed for staging PET scan.  NGS panel testing requested on the specimen and also liquid biopsy obtained.  It showed no actionable mutations.  Staging PET scan on 12/29/2023 showed known gallbladder mass extending into the liver.  Peritoneal carcinomatosis noted.  Numerous lung nodules were identified, small bowel with FDG uptake, concerning for metastatic disease in the lungs.  Metastatic lymphadenopathy was noted in the abdomen and retroperitoneum.  This scan will serve as a good baseline for future compression.    Plan for palliative systemic chemoimmunotherapy with cisplatin , gemcitabine , durvalumab . Scheduled to start C1D1 from 01/01/24.   Oncology History  Adenocarcinoma of gallbladder (HCC)  12/24/2023 Initial Diagnosis   Adenocarcinoma of gallbladder (HCC)   12/24/2023 Cancer Staging   Staging form: Gallbladder, AJCC 8th Edition - Clinical: Stage IVB (cTX, cNX, pM1) - Signed by Autumn Millman, MD on 12/24/2023 Histologic grade (G): G2 Histologic grading system: 3 grade system   01/01/2024 -  Chemotherapy   Patient is on Treatment Plan : BILIARY TRACT Cisplatin  + Gemcitabine  D1,8 + Durvalumab  (1500) D1 q21d / Durvalumab  (1500) q28d         REVIEW OF SYSTEMS:   Review of Systems - Oncology  All other pertinent systems were reviewed with the patient and are negative.  ALLERGIES: She is allergic to sulfa antibiotics, forteo  [parathyroid hormone (recomb)], atorvastatin, and metformin hcl.  MEDICATIONS:  Current Outpatient Medications  Medication Sig Dispense Refill   albuterol  (VENTOLIN  HFA) 108 (90 Base) MCG/ACT inhaler Inhale 1-2 puffs into the lungs every 6 (six) hours  as needed for wheezing or shortness of breath. 8 g 1   aspirin  81 MG EC tablet Take 81 mg by mouth daily.     budesonide  (PULMICORT ) 0.5 MG/2ML nebulizer solution Generic for Pulmicort .  Inhale one vial in nebulizer twice a day. Rinse mouth after use. 60 mL 11   dexamethasone  (DECADRON ) 4 MG tablet Take 2 tablets (8 mg) by mouth daily x 3 days starting the day after cisplatin  chemotherapy. Take with food. 30 tablet 1   docusate sodium  (COLACE) 100 MG capsule Take 500 mg by mouth at bedtime.     formoterol  (PERFOROMIST ) 20 MCG/2ML nebulizer solution Substituted for: Perforomist  Solution Inhale one vial in nebulizer twice a day. 60 mL 11   lidocaine -prilocaine  (EMLA ) cream Apply to affected area once 30 g 3   magnesium  oxide (MAG-OX) 400 (240 Mg) MG tablet Take 1 tablet (400 mg total) by mouth 2 (two) times daily. 60 tablet 1   megestrol  (MEGACE  ES) 625 MG/5ML suspension Take 5 mLs (625 mg total) by mouth daily. 150 mL 3   methylPREDNISolone  (MEDROL  DOSEPAK) 4 MG TBPK tablet Take as directed on package instructions 21 tablet 1   metoprolol  succinate (TOPROL -XL) 50 MG 24 hr tablet TAKE 1 TABLET BY MOUTH ONCE DAILY. TAKE WITH OR IMMEDIATELY FOLLOWING A MEAL 90 tablet 1   morphine  (MS CONTIN ) 15 MG 12 hr tablet Take 1 tablet (15 mg total) by mouth every 12 (twelve) hours. 60 tablet 0   omeprazole  (PRILOSEC) 20 MG capsule Take 1 capsule (20 mg total) by mouth 2 (two) times daily before a meal. 60 capsule 3   ondansetron  (ZOFRAN ) 4 MG tablet Take 1 tablet (4 mg total) by mouth every 6 (six) hours as needed for nausea. 30 tablet 0   ondansetron  (ZOFRAN ) 8 MG tablet Take 1 tablet (8 mg total) by mouth every 8 (eight) hours as needed for nausea or vomiting. Start on the third day after cisplatin . 30 tablet 1   oxyCODONE  10 MG TABS Take 1-1.5 tablets (10-15 mg total) by mouth every 4 (four) hours as needed for moderate pain (pain score 4-6) or severe pain (pain score 7-10). (Patient taking differently: Take 10-15 mg by mouth every 4 (four) hours as needed for moderate pain (pain score 4-6) or severe pain (pain score 7-10). Pt reports she takes this once in awhile) 60 tablet 0   polyethylene  glycol (MIRALAX  / GLYCOLAX ) 17 g packet Take 17 g by mouth 2 (two) times daily.     prochlorperazine  (COMPAZINE ) 10 MG tablet Take 1 tablet (10 mg total) by mouth every 6 (six) hours as needed (Nausea or vomiting). 30 tablet 1   revefenacin  (YUPELRI ) 175 MCG/3ML nebulizer solution Inhale one vial in nebulizer once daily. Do not mix with other nebulized medications. 30 mL 11   rosuvastatin  (CRESTOR ) 10 MG tablet Take 1 tablet by mouth once daily 90 tablet 0   spironolactone  (ALDACTONE ) 25 MG tablet Take 25 mg by mouth daily.     No current facility-administered medications for this visit.   Facility-Administered Medications Ordered in Other Visits  Medication Dose Route Frequency Provider Last Rate Last Admin   0.9 %  sodium chloride  infusion   Intravenous Continuous Aldora Perman, MD   Stopped at 03/11/24 1556     VITALS:   Blood pressure (!) 167/53, pulse 69, temperature 98 F (36.7 C), temperature source Temporal, resp. rate 16, height 5' 2 (1.575 m), weight 164 lb (74.4 kg), SpO2 100%.  Wt Readings from  Last 3 Encounters:  03/11/24 164 lb (74.4 kg)  03/05/24 165 lb (74.8 kg)  03/03/24 165 lb 5 oz (75 kg)    Body mass index is 30 kg/m.    Onc Performance Status - 03/11/24 0857       ECOG Perf Status   ECOG Perf Status Restricted in physically strenuous activity but ambulatory and able to carry out work of a light or sedentary nature, e.g., light house work, office work      KPS SCALE   KPS % SCORE Normal activity with effort, some s/s of disease             PHYSICAL EXAM:   Physical Exam Constitutional:      General: She is not in acute distress.    Appearance: Normal appearance.  HENT:     Head: Normocephalic and atraumatic.  Cardiovascular:     Rate and Rhythm: Normal rate.  Pulmonary:     Effort: Pulmonary effort is normal. No respiratory distress.  Abdominal:     General: There is no distension.  Neurological:     General: No focal deficit present.      Mental Status: She is alert and oriented to person, place, and time.  Psychiatric:        Mood and Affect: Mood normal.        Behavior: Behavior normal.       LABORATORY DATA:   I have reviewed the data as listed.  Results for orders placed or performed in visit on 03/11/24  Magnesium   Result Value Ref Range   Magnesium  1.4 (L) 1.7 - 2.4 mg/dL  CMP (Cancer Center only)  Result Value Ref Range   Sodium 139 135 - 145 mmol/L   Potassium 4.4 3.5 - 5.1 mmol/L   Chloride 109 98 - 111 mmol/L   CO2 24 22 - 32 mmol/L   Glucose, Bld 110 (H) 70 - 99 mg/dL   BUN 15 8 - 23 mg/dL   Creatinine 9.03 9.55 - 1.00 mg/dL   Calcium  8.4 (L) 8.9 - 10.3 mg/dL   Total Protein 5.7 (L) 6.5 - 8.1 g/dL   Albumin  3.5 3.5 - 5.0 g/dL   AST 28 15 - 41 U/L   ALT 28 0 - 44 U/L   Alkaline Phosphatase 84 38 - 126 U/L   Total Bilirubin 0.5 0.0 - 1.2 mg/dL   GFR, Estimated >39 >39 mL/min   Anion gap 6 5 - 15  CBC with Differential (Cancer Center Only)  Result Value Ref Range   WBC Count 5.0 4.0 - 10.5 K/uL   RBC 2.61 (L) 3.87 - 5.11 MIL/uL   Hemoglobin 8.4 (L) 12.0 - 15.0 g/dL   HCT 75.4 (L) 63.9 - 53.9 %   MCV 93.9 80.0 - 100.0 fL   MCH 32.2 26.0 - 34.0 pg   MCHC 34.3 30.0 - 36.0 g/dL   RDW 80.8 (H) 88.4 - 84.4 %   Platelet Count 112 (L) 150 - 400 K/uL   nRBC 0.0 0.0 - 0.2 %   Neutrophils Relative % 77 %   Neutro Abs 3.8 1.7 - 7.7 K/uL   Lymphocytes Relative 17 %   Lymphs Abs 0.8 0.7 - 4.0 K/uL   Monocytes Relative 6 %   Monocytes Absolute 0.3 0.1 - 1.0 K/uL   Eosinophils Relative 0 %   Eosinophils Absolute 0.0 0.0 - 0.5 K/uL   Basophils Relative 0 %   Basophils Absolute 0.0 0.0 - 0.1 K/uL  Immature Granulocytes 0 %   Abs Immature Granulocytes 0.02 0.00 - 0.07 K/uL       RADIOGRAPHIC STUDIES:  I have personally reviewed the radiological images as listed and agree with the findings in the report.  No results found.   CODE STATUS:  Code Status History     Date Active Date  Inactive Code Status Order ID Comments User Context   12/14/2023 1601 12/16/2023 2004 Limited: Do not attempt resuscitation (DNR) -DNR-LIMITED -Do Not Intubate/DNI  [id number]  Caleen Burgess BROCKS, MD ED   09/17/2022 1236 09/18/2022 1519 Full Code [id number]  Onetha Kuba, MD Inpatient   02/27/2021 1602 03/09/2021 2325 Full Code [id number]  Onetha Kuba, MD Inpatient   06/27/2020 1110 06/28/2020 0504 Full Code [id number]  Derrill Dawn, MD HOV    Questions for Most Recent Historical Code Status (Order [id number])     Question Answer   If pulseless and not breathing No CPR or chest compressions.   In Pre-Arrest Conditions (Patient Is Breathing and Has A Pulse) Do not intubate. Provide all appropriate non-invasive medical interventions. Avoid ICU transfer unless indicated or required.   Consent: Discussion documented in EHR or advanced directives reviewed                Advance Directive Documentation    Flowsheet Row Most Recent Value  Type of Advance Directive Healthcare Power of Attorney, Living will  Pre-existing out of facility DNR order (yellow form or pink MOST form) --  MOST Form in Place? --    No orders of the defined types were placed in this encounter.    Future Appointments  Date Time Provider Department Center  03/17/2024 12:30 PM [facility] PET CT 1 [facility] [place]  03/24/2024 10:45 AM [facility] FLUSH [facility] None  03/24/2024 11:15 AM Rea Kalama, MD [facility] None  03/25/2024 10:00 AM [facility] INFUSION [facility] None  03/31/2024 11:15 AM [facility] FLUSH [facility] None  03/31/2024 11:45 AM Timber Lucarelli, MD [facility] None  04/01/2024 10:00 AM [facility] INFUSION [facility] None  04/01/2024 11:15 AM Ivonne Harlene RAMAN, RD [facility] None  04/04/2024  2:00 PM [facility] FLUSH [facility] None  04/11/2024  1:15 PM Kara Dorn NOVAK, MD [facility] [address]  04/14/2024 11:00 AM [facility] FLUSH [facility] None  04/14/2024 11:30 AM Cason Luffman, Chinita, MD [facility] None   04/15/2024  9:30 AM [facility] INFUSION [facility] None      This document was completed utilizing speech recognition software. Grammatical errors, random word insertions, pronoun errors, and incomplete sentences are an occasional consequence of this system due to software limitations, ambient noise, and hardware issues. Any formal questions or concerns about the content, text or information contained within the body of this dictation should be directly addressed to the provider for clarification.

## 2024-03-11 NOTE — Patient Instructions (Signed)
 [facility] - A DEPT [facility]  Discharge Instructions: Thank you for choosing [facility] to provide your oncology and hematology care.   If you have a lab appointment with the Cancer Center, please go directly to the Cancer Center and check in at the registration area.   Wear comfortable clothing and clothing appropriate for easy access to any Portacath or PICC line.   We strive to give you quality time with your provider. You may need to reschedule your appointment if you arrive late (15 or more minutes).  Arriving late affects you and other patients whose appointments are after yours.  Also, if you miss three or more appointments without notifying the office, you may be dismissed from the clinic at the provider's discretion.      For prescription refill requests, have your pharmacy contact our office and allow 72 hours for refills to be completed.    Today you received the following chemotherapy and/or immunotherapy agents: cisplatin and gemcitabine      To help prevent nausea and vomiting after your treatment, we encourage you to take your nausea medication as directed.  BELOW ARE SYMPTOMS THAT SHOULD BE REPORTED IMMEDIATELY: *FEVER GREATER THAN 100.4 F (38 C) OR HIGHER *CHILLS OR SWEATING *NAUSEA AND VOMITING THAT IS NOT CONTROLLED WITH YOUR NAUSEA MEDICATION *UNUSUAL SHORTNESS OF BREATH *UNUSUAL BRUISING OR BLEEDING *URINARY PROBLEMS (pain or burning when urinating, or frequent urination) *BOWEL PROBLEMS (unusual diarrhea, constipation, pain near the anus) TENDERNESS IN MOUTH AND THROAT WITH OR WITHOUT PRESENCE OF ULCERS (sore throat, sores in mouth, or a toothache) UNUSUAL RASH, SWELLING OR PAIN  UNUSUAL VAGINAL DISCHARGE OR ITCHING   Items with * indicate a potential emergency and should be followed up as soon as possible or go to the Emergency Department if any problems should occur.  Please show the CHEMOTHERAPY ALERT CARD  or IMMUNOTHERAPY ALERT CARD at check-in to the Emergency Department and triage nurse.  Should you have questions after your visit or need to cancel or reschedule your appointment, please contact [facility] - A DEPT [facility]  Dept: [phone]  and follow the prompts.  Office hours are 8:00 a.m. to 4:30 p.m. Monday - Friday. Please note that voicemails left after 4:00 p.m. may not be returned until the following business day.  We are closed weekends and major holidays. You have access to a nurse at all times for urgent questions. Please call the main number to the clinic Dept: [phone] and follow the prompts.   For any non-urgent questions, you may also contact your provider using MyChart. We now offer e-Visits for anyone 62 and older to request care online for non-urgent symptoms. For details visit [email or web address].   Also download the MyChart app! Go to the app store, search "MyChart", open the app, select [place], and log in with your MyChart username and password.

## 2024-03-12 ENCOUNTER — Encounter: Payer: Self-pay | Admitting: Oncology

## 2024-03-15 ENCOUNTER — Encounter: Payer: Self-pay | Admitting: Sports Medicine

## 2024-03-15 ENCOUNTER — Inpatient Hospital Stay: Attending: Oncology

## 2024-03-15 ENCOUNTER — Ambulatory Visit: Attending: Oncology

## 2024-03-17 ENCOUNTER — Ambulatory Visit (HOSPITAL_COMMUNITY): Admission: RE | Admit: 2024-03-17 | Source: Ambulatory Visit

## 2024-03-21 ENCOUNTER — Ambulatory Visit (HOSPITAL_COMMUNITY)
Admission: RE | Admit: 2024-03-21 | Discharge: 2024-03-21 | Disposition: A | Discharge status: Home / Self Care | Source: Ambulatory Visit | Attending: Oncology

## 2024-03-21 DIAGNOSIS — C23 Malignant neoplasm of gallbladder: Secondary | ICD-10-CM | POA: Diagnosis present

## 2024-03-21 LAB — GLUCOSE, CAPILLARY: Glucose-Capillary: 103 mg/dL — ABNORMAL HIGH (ref 70–99)

## 2024-03-21 MED ORDER — FLUDEOXYGLUCOSE F - 18 (FDG) INJECTION
8.2000 | Freq: Once | INTRAVENOUS | Status: AC
Start: 2024-03-21 — End: 2024-03-21
  Administered 2024-03-21: 8.2 via INTRAVENOUS

## 2024-03-24 ENCOUNTER — Other Ambulatory Visit: Payer: Self-pay

## 2024-03-24 ENCOUNTER — Encounter: Payer: Self-pay | Admitting: Oncology

## 2024-03-24 ENCOUNTER — Inpatient Hospital Stay

## 2024-03-24 ENCOUNTER — Inpatient Hospital Stay: Attending: Oncology | Admitting: Oncology

## 2024-03-24 VITALS — BP 162/59 | HR 75 | Temp 97.8°F | Resp 17 | Ht 62.0 in | Wt 164.0 lb

## 2024-03-24 DIAGNOSIS — Z5111 Encounter for antineoplastic chemotherapy: Secondary | ICD-10-CM | POA: Diagnosis present

## 2024-03-24 DIAGNOSIS — J4489 Other specified chronic obstructive pulmonary disease: Secondary | ICD-10-CM | POA: Diagnosis not present

## 2024-03-24 DIAGNOSIS — C23 Malignant neoplasm of gallbladder: Secondary | ICD-10-CM

## 2024-03-24 DIAGNOSIS — F32A Depression, unspecified: Secondary | ICD-10-CM | POA: Insufficient documentation

## 2024-03-24 DIAGNOSIS — Z66 Do not resuscitate: Secondary | ICD-10-CM | POA: Diagnosis not present

## 2024-03-24 DIAGNOSIS — D701 Agranulocytosis secondary to cancer chemotherapy: Secondary | ICD-10-CM | POA: Diagnosis not present

## 2024-03-24 DIAGNOSIS — C7801 Secondary malignant neoplasm of right lung: Secondary | ICD-10-CM | POA: Insufficient documentation

## 2024-03-24 DIAGNOSIS — I10 Essential (primary) hypertension: Secondary | ICD-10-CM | POA: Diagnosis not present

## 2024-03-24 DIAGNOSIS — G8929 Other chronic pain: Secondary | ICD-10-CM | POA: Insufficient documentation

## 2024-03-24 DIAGNOSIS — K219 Gastro-esophageal reflux disease without esophagitis: Secondary | ICD-10-CM | POA: Diagnosis not present

## 2024-03-24 DIAGNOSIS — D6481 Anemia due to antineoplastic chemotherapy: Secondary | ICD-10-CM | POA: Diagnosis not present

## 2024-03-24 DIAGNOSIS — D759 Disease of blood and blood-forming organs, unspecified: Secondary | ICD-10-CM | POA: Insufficient documentation

## 2024-03-24 DIAGNOSIS — Z79899 Other long term (current) drug therapy: Secondary | ICD-10-CM | POA: Insufficient documentation

## 2024-03-24 DIAGNOSIS — Z882 Allergy status to sulfonamides status: Secondary | ICD-10-CM | POA: Insufficient documentation

## 2024-03-24 DIAGNOSIS — Z5112 Encounter for antineoplastic immunotherapy: Secondary | ICD-10-CM | POA: Diagnosis present

## 2024-03-24 DIAGNOSIS — T451X5A Adverse effect of antineoplastic and immunosuppressive drugs, initial encounter: Secondary | ICD-10-CM | POA: Diagnosis not present

## 2024-03-24 DIAGNOSIS — C7802 Secondary malignant neoplasm of left lung: Secondary | ICD-10-CM | POA: Diagnosis not present

## 2024-03-24 DIAGNOSIS — Z7982 Long term (current) use of aspirin: Secondary | ICD-10-CM | POA: Insufficient documentation

## 2024-03-24 DIAGNOSIS — Z79630 Long term (current) use of alkylating agent: Secondary | ICD-10-CM | POA: Insufficient documentation

## 2024-03-24 DIAGNOSIS — Z5189 Encounter for other specified aftercare: Secondary | ICD-10-CM | POA: Insufficient documentation

## 2024-03-24 DIAGNOSIS — E876 Hypokalemia: Secondary | ICD-10-CM | POA: Diagnosis not present

## 2024-03-24 DIAGNOSIS — M1990 Unspecified osteoarthritis, unspecified site: Secondary | ICD-10-CM | POA: Insufficient documentation

## 2024-03-24 DIAGNOSIS — J029 Acute pharyngitis, unspecified: Secondary | ICD-10-CM | POA: Insufficient documentation

## 2024-03-24 DIAGNOSIS — K828 Other specified diseases of gallbladder: Secondary | ICD-10-CM | POA: Insufficient documentation

## 2024-03-24 DIAGNOSIS — M549 Dorsalgia, unspecified: Secondary | ICD-10-CM | POA: Diagnosis not present

## 2024-03-24 DIAGNOSIS — C772 Secondary and unspecified malignant neoplasm of intra-abdominal lymph nodes: Secondary | ICD-10-CM | POA: Diagnosis not present

## 2024-03-24 DIAGNOSIS — R220 Localized swelling, mass and lump, head: Secondary | ICD-10-CM | POA: Insufficient documentation

## 2024-03-24 DIAGNOSIS — F419 Anxiety disorder, unspecified: Secondary | ICD-10-CM | POA: Diagnosis not present

## 2024-03-24 DIAGNOSIS — Z79631 Long term (current) use of antimetabolite agent: Secondary | ICD-10-CM | POA: Insufficient documentation

## 2024-03-24 DIAGNOSIS — R21 Rash and other nonspecific skin eruption: Secondary | ICD-10-CM | POA: Diagnosis not present

## 2024-03-24 DIAGNOSIS — L299 Pruritus, unspecified: Secondary | ICD-10-CM | POA: Insufficient documentation

## 2024-03-24 DIAGNOSIS — C786 Secondary malignant neoplasm of retroperitoneum and peritoneum: Secondary | ICD-10-CM | POA: Insufficient documentation

## 2024-03-24 DIAGNOSIS — Z79620 Long term (current) use of immunosuppressive biologic: Secondary | ICD-10-CM | POA: Insufficient documentation

## 2024-03-24 LAB — SAMPLE TO BLOOD BANK

## 2024-03-24 LAB — CMP (CANCER CENTER ONLY)
ALT: 37 U/L (ref 0–44)
AST: 40 U/L (ref 15–41)
Albumin: 3.6 g/dL (ref 3.5–5.0)
Alkaline Phosphatase: 139 U/L — ABNORMAL HIGH (ref 38–126)
Anion gap: 5 (ref 5–15)
BUN: 8 mg/dL (ref 8–23)
CO2: 26 mmol/L (ref 22–32)
Calcium: 8.3 mg/dL — ABNORMAL LOW (ref 8.9–10.3)
Chloride: 111 mmol/L (ref 98–111)
Creatinine: 0.88 mg/dL (ref 0.44–1.00)
GFR, Estimated: >60 mL/min (ref >60)
Glucose, Bld: 101 mg/dL — ABNORMAL HIGH (ref 70–99)
Potassium: 3.8 mmol/L (ref 3.5–5.1)
Sodium: 142 mmol/L (ref 135–145)
Total Bilirubin: 0.6 mg/dL (ref 0.0–1.2)
Total Protein: 5.8 g/dL — ABNORMAL LOW (ref 6.5–8.1)

## 2024-03-24 LAB — CBC WITH DIFFERENTIAL (CANCER CENTER ONLY)
Abs Immature Granulocytes: 0.02 K/uL (ref 0.00–0.07)
Basophils Absolute: 0 K/uL (ref 0.0–0.1)
Basophils Relative: 0 %
Eosinophils Absolute: 0 K/uL (ref 0.0–0.5)
Eosinophils Relative: 1 %
HCT: 21.1 % — ABNORMAL LOW (ref 36.0–46.0)
Hemoglobin: 7.2 g/dL — ABNORMAL LOW (ref 12.0–15.0)
Immature Granulocytes: 1 %
Lymphocytes Relative: 33 %
Lymphs Abs: 1 K/uL (ref 0.7–4.0)
MCH: 33 pg (ref 26.0–34.0)
MCHC: 34.1 g/dL (ref 30.0–36.0)
MCV: 96.8 fL (ref 80.0–100.0)
Monocytes Absolute: 0.6 K/uL (ref 0.1–1.0)
Monocytes Relative: 21 %
Neutro Abs: 1.3 K/uL — ABNORMAL LOW (ref 1.7–7.7)
Neutrophils Relative %: 44 %
Platelet Count: 151 K/uL (ref 150–400)
RBC: 2.18 MIL/uL — ABNORMAL LOW (ref 3.87–5.11)
RDW: 19.9 % — ABNORMAL HIGH (ref 11.5–15.5)
WBC Count: 3 K/uL — ABNORMAL LOW (ref 4.0–10.5)
nRBC: 0 % (ref 0.0–0.2)

## 2024-03-24 LAB — MAGNESIUM: Magnesium: 1.3 mg/dL — ABNORMAL LOW (ref 1.7–2.4)

## 2024-03-24 LAB — PREPARE RBC (CROSSMATCH)

## 2024-03-24 NOTE — Progress Notes (Signed)
 One Unit PRBC ordered to be transfused tomorrow.

## 2024-03-24 NOTE — Assessment & Plan Note (Addendum)
 Please review oncology history for additional details and timeline of events.  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 Biopsy from the omentum confirmed adenocarcinoma, originating from the gallbladder.  Immunostains support diagnosis of gallbladder carcinoma.    Previously I discussed diagnosis, staging, prognosis, plan of care, treatment options.  Reviewed NCCN guidelines. Given stage IV disease, all treatment options are palliative in nature and not curative in intent.  This was discussed with patient, her daughter and son-in-law who were accompanying.  They verbalized understanding.    Discussed the aggressive nature of stage IV gallbladder adenocarcinoma and overall poor prognosis.  Life expectancy with treatment is 1 to 1.5 years; without treatment, less than six months.   NGS panel testing on the specimen and also liquid biopsy for mutation analysis showed no actionable mutations.  Staging PET scan on 12/29/2023 showed known gallbladder mass extending into the liver.  Peritoneal carcinomatosis noted.  Numerous lung nodules were identified, small bowel with FDG uptake, concerning for metastatic disease in the lungs.  Metastatic lymphadenopathy was noted in the abdomen and retroperitoneum.  This scan will serve as a good baseline for future compression.  Discussed results with the patient and family members previously.   Plan made to proceed with palliative systemic chemoimmunotherapy with gemcitabine , cisplatin  and durvalumab .  She started this from 01/01/2024.  Tolerating chemo well so far.  No major side effects.  After her last chemotherapy, she presented to the ED with complaints of facial swelling and redness.  It resolved with Medrol  Dosepak.  Symptoms lasted for 1 to 2 days.  Of note, patient also received her first ever blood transfusion around the same time as last chemotherapy.  Completed 4 cycles of chemotherapy so far.  Restaging PET scan on 03/21/2024  showed overall improvement with improvement in the lesions in the lungs, lymph nodes, and omentum.   Plan is to continue current management for up to 8 cycles.  If cytopenias or other side effects are prohibitive, we will plan for at least 6 cycles.  Labs today reveal leukopenia and anemia.  Will hold chemotherapy tomorrow.  Proceed with 1 unit of PRBC transfusion.  RTC in 1 week for reevaluation and resumption of chemotherapy with cycle 5-day 1.  Previously we did not perform genetic testing.  We will obtain blood samples for genetic testing next week.

## 2024-03-24 NOTE — Assessment & Plan Note (Addendum)
 Hemoglobin decreased again at 7.2.  We will arrange for 1 unit of PRBC transfusion tomorrow and hold chemotherapy as mentioned above.

## 2024-03-24 NOTE — Progress Notes (Signed)
 [facility]  ONCOLOGY CLINIC PROGRESS NOTE   Patient Care Team: [name] as PCP - General (Physician Assistant) Pcp, [name], DO (Family Medicine) Pa, [facility] [name], RN as Oncology Nurse Navigator  PATIENT NAME: Janice Pace   MR#: [id number] DOB: 1946/01/06  Date of visit: 03/24/2024   ASSESSMENT & PLAN:   Janice Pace is a 78 y.o. 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   Adenocarcinoma of gallbladder [facility]) Please review oncology history for additional details and timeline of events.  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 Biopsy from the omentum confirmed adenocarcinoma, originating from the gallbladder.  Immunostains support diagnosis of gallbladder carcinoma.    Previously I discussed diagnosis, staging, prognosis, plan of care, treatment options.  Reviewed NCCN guidelines. Given stage IV disease, all treatment options are palliative in nature and not curative in intent.  This was discussed with patient, her daughter and son-in-law who were accompanying.  They verbalized understanding.    Discussed the aggressive nature of stage IV gallbladder adenocarcinoma and overall poor prognosis.  Life expectancy with treatment is 1 to 1.5 years; without treatment, less than six months.   NGS panel testing on the specimen and also liquid biopsy for mutation analysis showed no actionable mutations.  Staging PET scan on 12/29/2023 showed known gallbladder mass extending into the liver.  Peritoneal carcinomatosis noted.  Numerous lung nodules were identified, small bowel with FDG uptake, concerning for metastatic disease in the lungs.   Metastatic lymphadenopathy was noted in the abdomen and retroperitoneum.  This scan will serve as a good baseline for future compression.  Discussed results with the patient and family members previously.   Plan made to proceed with palliative systemic chemoimmunotherapy with gemcitabine , cisplatin  and durvalumab .  She started this from 01/01/2024.  Tolerating chemo well so far.  No major side effects.  After her last chemotherapy, she presented to the ED with complaints of facial swelling and redness.  It resolved with Medrol  Dosepak.  Symptoms lasted for 1 to 2 days.  Of note, patient also received her first ever blood transfusion around the same time as last chemotherapy.  Completed 4 cycles of chemotherapy so far.  Restaging PET scan on 03/21/2024 showed overall improvement with improvement in the lesions in the lungs, lymph nodes, and omentum.   Plan is to continue current management for up to 8 cycles.  If cytopenias or other side effects are prohibitive, we will plan for at least 6 cycles.  Labs today reveal leukopenia and anemia.  Will hold chemotherapy tomorrow.  Proceed with 1 unit of PRBC transfusion.  RTC in 1 week for reevaluation and resumption of chemotherapy with cycle 5-day 1.  Previously we did not perform genetic testing.  We will obtain blood samples for genetic testing next week.  Anemia due to antineoplastic chemotherapy Hemoglobin decreased again at 7.2.  We will arrange for 1 unit of PRBC transfusion tomorrow and hold chemotherapy as mentioned above.  Facial swelling and erythema likely drug or transfusion reaction Recent episode of facial swelling and erythema, possibly related to chemotherapy or recent blood transfusion. Symptoms resolved with Medrol  dose pack. No itching or other symptoms reported. Plan to add extra pre-medications before chemotherapy to  prevent recurrence. Medrol  dose pack prescribed for future use if needed. - Prescribe Medrol  dose pack for future use  if needed - Send prescription to local pharmacy - Add extra pre-medications before chemotherapy to prevent reaction  Chemotherapy-induced fatigue Experiencing fatigue, which is common with chemotherapy. No major issues with energy levels, but she reports feeling tired and needing to rest frequently. - Encourage rest and activity as tolerated  I reviewed lab results and outside records for this visit and discussed relevant results with the patient. Diagnosis, plan of care and treatment options were also discussed in detail with the patient. Opportunity provided to ask questions and answers provided to her apparent satisfaction. Provided instructions to call our clinic with any problems, questions or concerns prior to return visit. I recommended to continue follow-up with PCP and sub-specialists. She verbalized understanding and agreed with the plan.   NCCN guidelines have been consulted in the planning of this patient's care.  I spent a total of 40 minutes during this encounter with the patient including review of chart and various tests results, discussions about plan of care and coordination of care plan.   Chinita Patten, MD  03/24/2024 5:16 PM  [facility] - A DEPT [facility] [address] [place] [address] Dept: [phone] Dept Fax: [phone]    CHIEF COMPLAINT/ REASON FOR VISIT:   Stage IV gallbladder adenocarcinoma with peritoneal mets and lung mets.  Current Treatment: Palliative systemic chemoimmunotherapy with cisplatin , gemcitabine , durvalumab  started from 01/01/2024.  INTERVAL HISTORY:    Discussed the use of AI scribe software for clinical note transcription with the patient, who gave verbal consent to proceed.  History of Present Illness  Janice Pace is a 78 year old female with metastatic cancer who presents for follow-up of her treatment response and blood count management.  She is  undergoing treatment for metastatic cancer, with previous PET scans showing lesions in the lungs, lymph nodes, and abdomen. The lung lesions have decreased in brightness, and some nodules are no longer visible.  She has experienced facial swelling recently, which was managed with a short course of steroids. Her hemoglobin levels have been low, necessitating a blood transfusion. She experiences back pain that increases when she sits down, and she finds relief when her family member walks on her back.  Her current treatment regimen includes chemotherapy and immunotherapy, with plans for eight cycles. She has completed four cycles so far. Her white blood cell count was previously high, leading to a skipped booster shot, but it has since decreased to a level that allows continuation of treatment. She is currently not on any booster shots due to the recent high white count.  Family members inquired about the hereditary nature of her cancer, and it was noted that genetic testing on the specimen was done.    I have reviewed the past medical history, past surgical history, social history and family history with the patient and they are unchanged from previous note.  HISTORY OF PRESENT ILLNESS:   ONCOLOGY HISTORY:   78 y.o. lady with past medical history of essential hypertension, osteoarthritis, COPD/asthma, GERD, depression, anxiety, hiatal hernia, chronic back pain presented to the ED on 12/14/2023 with complaints of worsening epigastric/abdominal pain over 2-3 weeks.  Routine blood work was unremarkable in the ER.  However CT abdomen and pelvis showed irregular appearance of the gallbladder with suggestion of capsular thickening along the fundus of the gallbladder.  Gallbladder appeared inseparable from the adjacent portion of the liver.  Clinical picture was concerning for gallbladder carcinoma.  Mesenteric nodular thickening along the mid lower abdominal cavity indicating peritoneal carcinomatosis.  Soft  tissue nodular mass in the left lower quadrant region of the pelvis measuring 3.6 x 2.7 cm, likely ovarian neoplastic lesion.  Multiple lung nodules in the lower lobes bilaterally, largest measuring 12 mm in the right lower lobe, concerning for pulmonary metastatic disease.   She was admitted to the hospital for further evaluation and management.  IR was consulted for peritoneal mass biopsy.  CA 19-9 was significant elevated at 14,368.  CEA was increased at 98.7, CA-125 was also increased at 91.3.   On 12/14/2023, MRI abdomen/MRCP showed thickened, contracted gallbladder with a heterogeneous mass arising from the gallbladder fundus, contiguous with the adjacent liver parenchyma of hepatic segment IVB measuring 3.5 x 2.8 cm. Multiple small rim enhancing lesions within the liver parenchyma. Enlarged, necrotic appearing portacaval and porta hepatis lymph nodes. Small, although rim enhancing and abnormal appearing retroperitoneal lymph nodes. Peritoneal stranding and nodularity, incompletely visualized on this examination although present in the left upper quadrant, consistent with peritoneal metastatic disease. Numerous small bilateral pulmonary nodules, better assessed by CT. 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   On 12/15/2023, she underwent CT-guided biopsy of the omental lesion.  Pathology showed metastatic well to moderately differentiated adenocarcinoma. The tumor is positive for cytokeratin 7 and shows focal weak positivity for the GI marker CDX2.  The tumor is negative for the GI markers cytokeratin 20.  The tumor is also negative for the GU and GYN marker PAX8.  The tumor is negative for the pulmonary adeno marker TTF-1. This immunohistochemical pattern and histomorphology would be compatible with the clinical suspicion of a gallbladder primary.  The differential diagnosis would also include an upper GI or pancreaticobiliary primary.     Patient presented to our clinic on 12/24/2023 to establish care with us .  Request placed for staging PET scan.  NGS panel testing requested on the specimen and also liquid biopsy obtained.  It showed no actionable mutations.  Staging PET scan on 12/29/2023 showed known gallbladder mass extending into the liver.  Peritoneal carcinomatosis noted.  Numerous lung nodules were identified, small bowel with FDG uptake, concerning for metastatic disease in the lungs.  Metastatic lymphadenopathy was noted in the abdomen and retroperitoneum.  This scan will serve as a good baseline for future compression.    Plan for palliative systemic chemoimmunotherapy with cisplatin , gemcitabine , durvalumab . Scheduled to start C1D1 from 01/01/24.   Restaging PET scan on 03/21/2024 showed overall improvement in disease.  Oncology History  Adenocarcinoma of gallbladder (HCC)  12/24/2023 Initial Diagnosis   Adenocarcinoma of gallbladder (HCC)   12/24/2023 Cancer Staging   Staging form: Gallbladder, AJCC 8th Edition - Clinical: Stage IVB (cTX, cNX, pM1) - Signed by Autumn Millman, MD on 12/24/2023 Histologic grade (G): G2 Histologic grading system: 3 grade system   01/01/2024 -  Chemotherapy   Patient is on Treatment Plan : BILIARY TRACT Cisplatin  + Gemcitabine  D1,8 + Durvalumab  (1500) D1 q21d / Durvalumab  (1500) q28d         REVIEW OF SYSTEMS:   Review of Systems - Oncology  All other pertinent systems were reviewed with the patient and are negative.  ALLERGIES: She is allergic to sulfa antibiotics, forteo  [parathyroid hormone (recomb)], atorvastatin, and metformin hcl.  MEDICATIONS:  Current Outpatient Medications  Medication Sig Dispense Refill  albuterol  (VENTOLIN  HFA) 108 (90 Base) MCG/ACT inhaler Inhale 1-2 puffs into the lungs every 6 (six) hours as needed for wheezing or shortness of breath. 8 g 1   aspirin  81 MG EC tablet Take 81 mg by mouth daily.     budesonide  (PULMICORT ) 0.5 MG/2ML nebulizer  solution Generic for Pulmicort . Inhale one vial in nebulizer twice a day. Rinse mouth after use. 60 mL 11   dexamethasone  (DECADRON ) 4 MG tablet Take 2 tablets (8 mg) by mouth daily x 3 days starting the day after cisplatin  chemotherapy. Take with food. 30 tablet 1   docusate sodium  (COLACE) 100 MG capsule Take 500 mg by mouth at bedtime.     formoterol  (PERFOROMIST ) 20 MCG/2ML nebulizer solution Substituted for: Perforomist  Solution Inhale one vial in nebulizer twice a day. 60 mL 11   lidocaine -prilocaine  (EMLA ) cream Apply to affected area once 30 g 3   magnesium  oxide (MAG-OX) 400 (240 Mg) MG tablet Take 1 tablet (400 mg total) by mouth 2 (two) times daily. 60 tablet 1   megestrol  (MEGACE  ES) 625 MG/5ML suspension Take 5 mLs (625 mg total) by mouth daily. 150 mL 3   methylPREDNISolone  (MEDROL  DOSEPAK) 4 MG TBPK tablet Take as directed on package instructions 21 tablet 1   metoprolol  succinate (TOPROL -XL) 50 MG 24 hr tablet TAKE 1 TABLET BY MOUTH ONCE DAILY. TAKE WITH OR IMMEDIATELY FOLLOWING A MEAL 90 tablet 1   morphine  (MS CONTIN ) 15 MG 12 hr tablet Take 1 tablet (15 mg total) by mouth every 12 (twelve) hours. 60 tablet 0   omeprazole  (PRILOSEC) 20 MG capsule Take 1 capsule (20 mg total) by mouth 2 (two) times daily before a meal. 60 capsule 3   ondansetron  (ZOFRAN ) 4 MG tablet Take 1 tablet (4 mg total) by mouth every 6 (six) hours as needed for nausea. 30 tablet 0   ondansetron  (ZOFRAN ) 8 MG tablet Take 1 tablet (8 mg total) by mouth every 8 (eight) hours as needed for nausea or vomiting. Start on the third day after cisplatin . 30 tablet 1   oxyCODONE  10 MG TABS Take 1-1.5 tablets (10-15 mg total) by mouth every 4 (four) hours as needed for moderate pain (pain score 4-6) or severe pain (pain score 7-10). (Patient taking differently: Take 10-15 mg by mouth every 4 (four) hours as needed for moderate pain (pain score 4-6) or severe pain (pain score 7-10). Pt reports she takes this once in awhile)  60 tablet 0   polyethylene glycol (MIRALAX  / GLYCOLAX ) 17 g packet Take 17 g by mouth 2 (two) times daily.     prochlorperazine  (COMPAZINE ) 10 MG tablet Take 1 tablet (10 mg total) by mouth every 6 (six) hours as needed (Nausea or vomiting). 30 tablet 1   revefenacin  (YUPELRI ) 175 MCG/3ML nebulizer solution Inhale one vial in nebulizer once daily. Do not mix with other nebulized medications. 30 mL 11   rosuvastatin  (CRESTOR ) 10 MG tablet Take 1 tablet by mouth once daily 90 tablet 0   spironolactone  (ALDACTONE ) 25 MG tablet Take 25 mg by mouth daily.     No current facility-administered medications for this visit.     VITALS:   Blood pressure (!) 162/59, pulse 75, temperature 97.8 F (36.6 C), temperature source Temporal, resp. rate 17, height 5' 2 (1.575 m), weight 164 lb (74.4 kg), SpO2 100%.  Wt Readings from Last 3 Encounters:  03/24/24 164 lb (74.4 kg)  03/11/24 164 lb (74.4 kg)  03/05/24 165 lb (74.8 kg)  Body mass index is 30 kg/m.    Onc Performance Status - 03/24/24 1108       ECOG Perf Status   ECOG Perf Status Restricted in physically strenuous activity but ambulatory and able to carry out work of a light or sedentary nature, e.g., light house work, office work      KPS SCALE   KPS % SCORE Normal activity with effort, some s/s of disease             PHYSICAL EXAM:   Physical Exam Constitutional:      General: She is not in acute distress.    Appearance: Normal appearance.  HENT:     Head: Normocephalic and atraumatic.  Cardiovascular:     Rate and Rhythm: Normal rate.  Pulmonary:     Effort: Pulmonary effort is normal. No respiratory distress.  Abdominal:     General: There is no distension.  Neurological:     General: No focal deficit present.     Mental Status: She is alert and oriented to person, place, and time.  Psychiatric:        Mood and Affect: Mood normal.        Behavior: Behavior normal.       LABORATORY DATA:   I have  reviewed the data as listed.  Results for orders placed or performed in visit on 03/24/24  Type and screen  Result Value Ref Range   ABO/RH(D) B POS    Antibody Screen NEG    Sample Expiration      03/27/2024,2359 Performed at [facility], [address] [place], [place] [address]    Unit Number [id number]    Blood Component Type RED CELLS,LR    Unit division 00    Status of Unit ALLOCATED    Transfusion Status OK TO TRANSFUSE    Crossmatch Result Compatible   Prepare RBC (crossmatch)  Result Value Ref Range   Order Confirmation      ORDER PROCESSED BY BLOOD BANK Performed at [facility], [address], [place], [place] [address]   [facility]  Result Value Ref Range   Blood Product Unit Number [id number]    PRODUCT CODE [id number]    Unit Type and Rh 7300    Blood Product Expiration Date [id number]   Results for orders placed or performed in visit on 03/24/24  Sample to Blood Bank  Result Value Ref Range   Blood Bank Specimen SAMPLE AVAILABLE FOR TESTING    Sample Expiration      03/27/2024,2359 Performed at [facility], [address], [place], [place] [address]   Results for orders placed or performed in visit on 03/24/24  CMP (Cancer Center only)  Result Value Ref Range   Sodium 142 135 - 145 mmol/L   Potassium 3.8 3.5 - 5.1 mmol/L   Chloride 111 98 - 111 mmol/L   CO2 26 22 - 32 mmol/L   Glucose, Bld 101 (H) 70 - 99 mg/dL   BUN 8 8 - 23 mg/dL   Creatinine 9.11 9.55 - 1.00 mg/dL   Calcium  8.3 (L) 8.9 - 10.3 mg/dL   Total Protein 5.8 (L) 6.5 - 8.1 g/dL   Albumin  3.6 3.5 - 5.0 g/dL   AST 40 15 - 41 U/L   ALT 37 0 - 44 U/L   Alkaline Phosphatase 139 (H) 38 - 126 U/L   Total Bilirubin 0.6 0.0 - 1.2 mg/dL   GFR, Estimated >39 >39  mL/min   Anion gap 5 5 - 15  CBC with Differential (Cancer Center Only)  Result Value Ref Range   WBC Count 3.0 (L) 4.0 - 10.5 K/uL   RBC 2.18 (L) 3.87 - 5.11 MIL/uL    Hemoglobin 7.2 (L) 12.0 - 15.0 g/dL   HCT 78.8 (L) 63.9 - 53.9 %   MCV 96.8 80.0 - 100.0 fL   MCH 33.0 26.0 - 34.0 pg   MCHC 34.1 30.0 - 36.0 g/dL   RDW 80.0 (H) 88.4 - 84.4 %   Platelet Count 151 150 - 400 K/uL   nRBC 0.0 0.0 - 0.2 %   Neutrophils Relative % 44 %   Neutro Abs 1.3 (L) 1.7 - 7.7 K/uL   Lymphocytes Relative 33 %   Lymphs Abs 1.0 0.7 - 4.0 K/uL   Monocytes Relative 21 %   Monocytes Absolute 0.6 0.1 - 1.0 K/uL   Eosinophils Relative 1 %   Eosinophils Absolute 0.0 0.0 - 0.5 K/uL   Basophils Relative 0 %   Basophils Absolute 0.0 0.0 - 0.1 K/uL   Immature Granulocytes 1 %   Abs Immature Granulocytes 0.02 0.00 - 0.07 K/uL  Magnesium   Result Value Ref Range   Magnesium  1.3 (L) 1.7 - 2.4 mg/dL       RADIOGRAPHIC STUDIES:  I have personally reviewed the radiological images as listed and agree with the findings in the report.  NM PET Image Restag (PS) Skull Base To Thigh Result Date: 03/21/2024 EXAM: PET AND CT SKULL BASE TO MID THIGH 03/21/2024 01:07:18 PM TECHNIQUE: PET imaging was acquired from the base of the skull to the mid thighs. Non-contrast enhanced computed tomography was obtained for attenuation correction and anatomic localization. RADIOPHARMACEUTICAL: 8.2 mCi F-18 FDG Uptake time 60 minutes. Glucose level 103 mg/dl. COMPARISON: PET of 12/28/2023. CLINICAL HISTORY: Restaging stage IV adenocarcinoma of gallbladder. FINDINGS: HEAD AND NECK: No cervical nodal hypermetabolism. No cervical adenopathy. Left carotid atherosclerosis. CHEST: No thoracic nodal hypermetabolism. Index right upper lobe pulmonary nodule measures 7 mm and SUV 2.0 on image 23/7 versus 8 mm and suv 3.7 previously. SABRA An index superior segment left lower lobe nodule on the prior exam that measured 6 mm and SUV 3.3 has essentially resolved. No residual hypermetabolism in this area. Innumerable smaller pulmonary nodules are also less distinct today, for example within the right lower lobe at 7 mm in  image 46/7 versus 10 mm on the prior exam. ABDOMEN AND PELVIS: Hypermetabolism along the gallbladder fundus and extending into segment 4b of the liver is less distinct today, for example at SUV 7.1 on image 116/4, similar to SUV 6.8 on the prior exam. Porta hepatis node measures 1.0 cm and SUV 5.4 on image 108/4 compared to 1.2 cm and SUV 5.1 on the prior exam. The abdominal retroperitoneal hypermetabolic nodes have resolved. Anterior inferior omental hypermetabolic implant measures 2.7 x 1.7 cm and SUV 4.9 on image 153/4 compared to 2.9 x 1.6 cm and SUV 5.3 on the prior exam. Smaller more cephalad abdominal omental implants are similar. Left pelvic/adnexal implant measures 4.8 x 2.6 cm and SUV 5.6 on image 163/4 versus 4.2 x 3.0 cm and SUV 17.1 on the prior exam. Diffuse colonic hypermetabolism, most significant in the low rectum and anus at SUV 14.9, no CT correlate. Normal adrenal glands. Abdominal aortic atherosclerosis. Hysterectomy. Pelvic floor laxity. BONES AND SOFT TISSUE: No abnormal FDG activity localizes to the bones. No metabolically active aggressive osseous lesion. Thoracolumbar spine fixation. VASCULATURE: Right  port-a-cath tip mid right atrium. Aortic and coronary artery calcifications. IMPRESSION: 1. Minimal response to therapy, as evidenced by decreased size and hypermetabolism of Pulmonary Metastasis, gallbladder primary and abdominal nodal metastasis. Omental and left pelvic peritoneal/adnexal implants are relatively similar. 2.  No new sites of disease. 3. Anorectal hypermetabolism is most likely physiologic 4. Incidental findings, including: Aortic atherosclerosis (icd10-i70.0). Coronary artery atherosclerosis. Electronically signed by: Rockey Kilts MD 03/21/2024 01:33 PM EDT RP Workstation: [id number]     CODE STATUS:  Code Status History     Date Active Date Inactive Code Status Order ID Comments User Context   12/14/2023 1601 12/16/2023 2004 Limited: Do not attempt resuscitation (DNR)  -DNR-LIMITED -Do Not Intubate/DNI  [id number]  Caleen Burgess BROCKS, MD ED   09/17/2022 1236 09/18/2022 1519 Full Code [id number]  Onetha Kuba, MD Inpatient   02/27/2021 1602 03/09/2021 2325 Full Code [id number]  Onetha Kuba, MD Inpatient   06/27/2020 1110 06/28/2020 0504 Full Code [id number]  Derrill Dawn, MD HOV    Questions for Most Recent Historical Code Status (Order [id number])     Question Answer   If pulseless and not breathing No CPR or chest compressions.   In Pre-Arrest Conditions (Patient Is Breathing and Has A Pulse) Do not intubate. Provide all appropriate non-invasive medical interventions. Avoid ICU transfer unless indicated or required.   Consent: Discussion documented in EHR or advanced directives reviewed                Advance Directive Documentation    Flowsheet Row Most Recent Value  Type of Advance Directive Healthcare Power of Attorney, Living will  Pre-existing out of facility DNR order (yellow form or pink MOST form) --  MOST Form in Place? --    Orders Placed This Encounter  Procedures   CBC with Differential (Cancer Center Only)    Standing Status:   Future    Expected Date:   04/01/2024    Expiration Date:   04/01/2025   CMP (Cancer Center only)    Standing Status:   Future    Expected Date:   04/01/2024    Expiration Date:   04/01/2025   Magnesium     Standing Status:   Future    Expected Date:   04/01/2024    Expiration Date:   04/01/2025   Cancer antigen 19-9    Standing Status:   Future    Expected Date:   04/01/2024    Expiration Date:   04/01/2025   [facility] ([facility] ONLY    Standing Status:   Future    Expected Date:   04/01/2024    Expiration Date:   04/01/2025   Genetic Screening Order    Standing Status:   Future    Expected Date:   04/01/2024    Expiration Date:   04/01/2025     Future Appointments  Date Time Provider Department Center  03/25/2024 10:00 AM [facility] INFUSION [facility] None  03/31/2024 11:15 AM [facility] FLUSH [facility] None   03/31/2024 11:45 AM Inaara Tye, MD [facility] None  04/01/2024 10:00 AM [facility] INFUSION [facility] None  04/01/2024 11:15 AM Ivonne Harlene RAMAN, RD [facility] None  04/04/2024  2:00 PM [facility] FLUSH [facility] None  04/11/2024  1:15 PM Kara Dorn NOVAK, MD [facility] [address]  04/14/2024 11:00 AM [facility] FLUSH [facility] None  04/14/2024 11:30 AM Orchid Glassberg, MD [facility] None  04/15/2024  9:30 AM [facility] INFUSION [facility] None      This document was completed utilizing speech recognition  software. Grammatical errors, random word insertions, pronoun errors, and incomplete sentences are an occasional consequence of this system due to software limitations, ambient noise, and hardware issues. Any formal questions or concerns about the content, text or information contained within the body of this dictation should be directly addressed to the provider for clarification.

## 2024-03-25 ENCOUNTER — Inpatient Hospital Stay

## 2024-03-25 DIAGNOSIS — D6481 Anemia due to antineoplastic chemotherapy: Secondary | ICD-10-CM

## 2024-03-25 DIAGNOSIS — Z5112 Encounter for antineoplastic immunotherapy: Secondary | ICD-10-CM | POA: Diagnosis not present

## 2024-03-25 MED ORDER — ACETAMINOPHEN 325 MG PO TABS
650.0000 mg | ORAL_TABLET | Freq: Once | ORAL | Status: AC
  Administered 2024-03-25: 650 mg via ORAL
  Filled 2024-03-25: qty 2

## 2024-03-25 MED ORDER — DIPHENHYDRAMINE HCL 25 MG PO CAPS
25.0000 mg | ORAL_CAPSULE | Freq: Once | ORAL | Status: AC
  Administered 2024-03-25: 25 mg via ORAL
  Filled 2024-03-25: qty 1

## 2024-03-25 MED ORDER — SODIUM CHLORIDE 0.9% IV SOLUTION
250.0000 mL | INTRAVENOUS | Status: DC
  Administered 2024-03-25: 250 mL via INTRAVENOUS

## 2024-03-25 NOTE — Patient Instructions (Signed)
 Blood Transfusion, Adult A blood transfusion is a procedure in which you receive blood or a type of blood cell (blood component) through an IV. You may need a blood transfusion when you have a low blood count, which is a low number of any blood cell. This may result from a bleeding disorder, illness, injury, or surgery. The blood may come from a donor, or you may be able to have your own blood collected and stored (autologous blood donation) before a planned surgery. The blood given in a transfusion may be made up of different blood components. You may receive: Red blood cells. These carry oxygen to the cells in the body. Platelets. These help your blood to clot. Plasma. This is the liquid part of your blood. It carries proteins and other substances throughout the body. White blood cells. These help you fight infections. If you have hemophilia or another clotting disorder, you may also receive other types of blood products. Depending on the type of blood product, this procedure may take 1-4 hours to complete. Tell a health care provider about: Any bleeding problems you have. Any previous reactions you have had during a blood transfusion. Any allergies you have. All medicines you are taking, including vitamins, herbs, eye drops, creams, and over-the-counter medicines. Any surgeries you have had. Any medical conditions you have. Whether you are pregnant or may be pregnant. What are the risks? Talk with your health care provider about risks. The most common problems include: A mild allergic reaction, such as red, swollen areas of skin (hives) and itching. Fever or chills. This may be the body's response to new blood cells received. This may occur during or up to 4 hours after the transfusion. More serious problems may include: A serious allergic reaction that causes difficulty breathing or swelling around the face and lips. Transfusion-associated circulatory overload (TACO), or too much fluid in  the lungs. This may cause breathing problems. Transfusion-related acute lung injury (TRALI), which causes breathing difficulty and low oxygen in the blood. This can occur within hours of the transfusion or several days later. Iron overload. This can happen after receiving many blood transfusions over a period of time. Infection or virus being transmitted. This is rare because donated blood is carefully tested before it is given. Hemolytic transfusion reaction. This is rare. It happens when the body's defense system (immune system)tries to attack the new blood cells. Symptoms may include fever, chills, nausea, low blood pressure, and low back or chest pain. Transfusion-associated graft-versus-host disease (TAGVHD). This is rare. It happens when donated cells attack the body's healthy tissues. What happens before the procedure? You will have a blood test to check your blood type. This test is done to know what kind of blood your body will accept and to match it to the donor blood. If you are going to have a planned surgery, you may be able to do an autologous blood donation. This may be done in case you need to have a transfusion. You will have your temperature, blood pressure, and pulse checked before the transfusion. If you have had an allergic reaction to a transfusion in the past, you may be given medicine to help prevent a reaction. This medicine may be given to you by mouth (orally) or through an IV. What happens during the procedure?  An IV will be inserted into one of your veins. The bag of blood will be attached to your IV. The blood will then enter through your vein. Your temperature, blood pressure,  and pulse will be monitored during the transfusion. This monitoring is done to detect early signs of a transfusion reaction. Tell your nurse right away if you have any of these symptoms during the transfusion: Shortness of breath or trouble breathing. Chest or back pain. Fever or  chills. Itching or hives. If you have any signs or symptoms of a reaction, your transfusion will be stopped and you may be given medicine. When the transfusion is complete, your IV will be removed. Pressure may be applied to the IV site for a few minutes. A bandage (dressing)will be applied. The procedure may vary among health care providers and hospitals. What happens after the procedure? Your temperature, blood pressure, pulse, breathing rate, and blood oxygen level will be monitored until you leave the hospital or clinic. Your blood may be tested to see how you have responded to the transfusion. You may be warmed with fluids or blankets to maintain a normal body temperature. If you receive your blood transfusion in an outpatient setting, you will be told whom to contact to report any reactions. Where to find more information Visit the [employer]: redcross.org Summary A blood transfusion is a procedure in which you receive blood or a type of blood cell (blood component) through an IV. The blood given in a transfusion may be made up of different blood components. You may receive red blood cells, platelets, plasma, or white blood cells depending on the condition treated. Your temperature, blood pressure, and pulse will be monitored before, during, and after the transfusion. After the transfusion, your blood may be tested to see how your body has responded. This information is not intended to replace advice given to you by your health care provider. Make sure you discuss any questions you have with your health care provider. Document Revised: 09/27/2021 Document Reviewed: 09/27/2021 Elsevier Patient Education  2024 [employer].

## 2024-03-28 LAB — TYPE AND SCREEN
ABO/RH(D): B POS
Antibody Screen: NEGATIVE
Unit division: 0

## 2024-03-28 LAB — BPAM RBC
Blood Product Expiration Date: 202510092359
ISSUE DATE / TIME: 202509121032
Unit Type and Rh: 7300

## 2024-03-30 ENCOUNTER — Telehealth: Payer: Self-pay | Admitting: Oncology

## 2024-03-30 NOTE — Telephone Encounter (Signed)
 Scheduled and canceled appointments per WQ and staff message. Talked with the patient and she is aware of the changes and the made appointments.

## 2024-03-31 ENCOUNTER — Other Ambulatory Visit: Payer: Self-pay

## 2024-03-31 ENCOUNTER — Inpatient Hospital Stay (HOSPITAL_BASED_OUTPATIENT_CLINIC_OR_DEPARTMENT_OTHER): Admitting: Oncology

## 2024-03-31 ENCOUNTER — Encounter: Payer: Self-pay | Admitting: Oncology

## 2024-03-31 ENCOUNTER — Inpatient Hospital Stay

## 2024-03-31 VITALS — BP 138/72 | HR 71 | Temp 97.8°F | Resp 17 | Wt 163.0 lb

## 2024-03-31 DIAGNOSIS — Z5112 Encounter for antineoplastic immunotherapy: Secondary | ICD-10-CM | POA: Diagnosis not present

## 2024-03-31 DIAGNOSIS — T451X5A Adverse effect of antineoplastic and immunosuppressive drugs, initial encounter: Secondary | ICD-10-CM

## 2024-03-31 DIAGNOSIS — C23 Malignant neoplasm of gallbladder: Secondary | ICD-10-CM

## 2024-03-31 LAB — CBC WITH DIFFERENTIAL (CANCER CENTER ONLY)
Abs Immature Granulocytes: 0.01 K/uL (ref 0.00–0.07)
Basophils Absolute: 0 K/uL (ref 0.0–0.1)
Basophils Relative: 1 %
Eosinophils Absolute: 0 K/uL (ref 0.0–0.5)
Eosinophils Relative: 1 %
HCT: 26.1 % — ABNORMAL LOW (ref 36.0–46.0)
Hemoglobin: 9.1 g/dL — ABNORMAL LOW (ref 12.0–15.0)
Immature Granulocytes: 0 %
Lymphocytes Relative: 24 %
Lymphs Abs: 0.9 K/uL (ref 0.7–4.0)
MCH: 32.4 pg (ref 26.0–34.0)
MCHC: 34.9 g/dL (ref 30.0–36.0)
MCV: 92.9 fL (ref 80.0–100.0)
Monocytes Absolute: 0.8 K/uL (ref 0.1–1.0)
Monocytes Relative: 21 %
Neutro Abs: 2 K/uL (ref 1.7–7.7)
Neutrophils Relative %: 53 %
Platelet Count: 190 K/uL (ref 150–400)
RBC: 2.81 MIL/uL — ABNORMAL LOW (ref 3.87–5.11)
RDW: 17.9 % — ABNORMAL HIGH (ref 11.5–15.5)
WBC Count: 3.7 K/uL — ABNORMAL LOW (ref 4.0–10.5)
nRBC: 0 % (ref 0.0–0.2)

## 2024-03-31 LAB — CMP (CANCER CENTER ONLY)
ALT: 24 U/L (ref 0–44)
AST: 31 U/L (ref 15–41)
Albumin: 3.5 g/dL (ref 3.5–5.0)
Alkaline Phosphatase: 169 U/L — ABNORMAL HIGH (ref 38–126)
Anion gap: 4 — ABNORMAL LOW (ref 5–15)
BUN: 10 mg/dL (ref 8–23)
CO2: 27 mmol/L (ref 22–32)
Calcium: 8.5 mg/dL — ABNORMAL LOW (ref 8.9–10.3)
Chloride: 109 mmol/L (ref 98–111)
Creatinine: 0.82 mg/dL (ref 0.44–1.00)
GFR, Estimated: >60 mL/min (ref >60)
Glucose, Bld: 106 mg/dL — ABNORMAL HIGH (ref 70–99)
Potassium: 3.9 mmol/L (ref 3.5–5.1)
Sodium: 140 mmol/L (ref 135–145)
Total Bilirubin: 0.7 mg/dL (ref 0.0–1.2)
Total Protein: 5.9 g/dL — ABNORMAL LOW (ref 6.5–8.1)

## 2024-03-31 LAB — CEA (ACCESS): CEA (CHCC): 159.82 ng/mL — ABNORMAL HIGH (ref 0.00–5.00)

## 2024-03-31 LAB — MAGNESIUM: Magnesium: 1.6 mg/dL — ABNORMAL LOW (ref 1.7–2.4)

## 2024-03-31 NOTE — Progress Notes (Signed)
 [name], [name], DO (Family Medicine) [place], [facility] [name], RN as Oncology Nurse Navigator  PATIENT NAME: Janice Pace   MR#: [id number] DOB: 1946/03/20  Date of visit: 03/31/2024   ASSESSMENT & PLAN:   Janice Pace is a 78 y.o. 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   Adenocarcinoma of gallbladder [facility]) Please review oncology history for additional details and timeline of events.  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 Biopsy from the omentum confirmed adenocarcinoma, originating from the gallbladder.  Immunostains support diagnosis of gallbladder carcinoma.    Previously I discussed diagnosis, staging, prognosis, plan of care, treatment options.  Reviewed NCCN guidelines. Given stage IV disease, all treatment options are palliative in nature and not curative in intent.  This was discussed with patient, her daughter and son-in-law who were accompanying.  They verbalized understanding.    Discussed the aggressive nature of stage IV gallbladder adenocarcinoma and overall poor prognosis.  Life expectancy with treatment is 1 to 1.5 years; without treatment, less than six months.   NGS panel testing on the specimen and also liquid biopsy for mutation analysis showed no actionable mutations.  Staging PET scan on 12/29/2023 showed known gallbladder mass extending into the liver.  Peritoneal carcinomatosis noted.  Numerous lung nodules were identified, small bowel with FDG uptake, concerning for metastatic disease in the lungs.   Metastatic lymphadenopathy was noted in the abdomen and retroperitoneum.  This scan will serve as a good baseline for future compression.  Discussed results with the patient and family members previously.   Plan made to proceed with palliative systemic chemoimmunotherapy with gemcitabine , cisplatin  and durvalumab .  She started this from 01/01/2024.  Tolerating chemo well so far.  No major side effects.  After her last chemotherapy, she presented to the ED with complaints of facial swelling and redness.  It resolved with Medrol  Dosepak.  Symptoms lasted for 1 to 2 days.  Of note, patient also received her first ever blood transfusion around the same time as last chemotherapy.  Completed 4 cycles of chemotherapy so far.  Restaging PET scan on 03/21/2024 showed overall improvement with improvement in the lesions in the lungs, lymph nodes, and omentum.   Plan is to continue current management for up to 8 cycles.  If cytopenias or other side effects are prohibitive, we will plan for at least 6 cycles.  Last week chemotherapy had to be held because of anemia with hemoglobin of 7.2, leukopenia with white count of 3000.  She received 1 unit of PRBC transfusion.  Hemoglobin is better at 9.1 today.  White count is also better at 3700 with ANC of 2000.  Will proceed with cycle 5-day 1 of chemotherapy tomorrow as scheduled.  Previously we did not perform genetic testing.  Referral sent to genetic counselor.  We will obtain blood samples for genetic testing next week.  Facial swelling and erythema likely drug or transfusion reaction Experiencing a dry, itchy rash likely related to chemotherapy. Previous use of 1% cortisone cream was ineffective. - Advise taking Benadryl  25 mg by mouth at night due to its sedative effects. -  Apply Benadryl  topical cream twice a day for itching. - Use a thick moisturizer like Vanicream. - Take Decadron  for three days after chemotherapy. - Consider referral to a dermatologist if  the rash persists.  Chemotherapy-induced fatigue Experiencing fatigue, which is common with chemotherapy. No major issues with energy levels, but she reports feeling tired and needing to rest frequently. - Encourage rest and activity as tolerated  I reviewed lab results and outside records for this visit and discussed relevant results with the patient. Diagnosis, plan of care and treatment options were also discussed in detail with the patient. Opportunity provided to ask questions and answers provided to her apparent satisfaction. Provided instructions to call our clinic with any problems, questions or concerns prior to return visit. I recommended to continue follow-up with PCP and sub-specialists. She verbalized understanding and agreed with the plan.   NCCN guidelines have been consulted in the planning of this patient's care.  I spent a total of 30 minutes during this encounter with the patient including review of chart and various tests results, discussions about plan of care and coordination of care plan.   Chinita Patten, MD  03/31/2024 12:12 PM  [facility] - [facility] [address] [place] [address] Dept: [phone] Dept Fax: [phone]    CHIEF COMPLAINT/ REASON FOR VISIT:   Stage IV gallbladder adenocarcinoma with peritoneal mets and lung mets.  Current Treatment: Palliative systemic chemoimmunotherapy with cisplatin , gemcitabine , durvalumab  started from 01/01/2024.  INTERVAL HISTORY:    Discussed the use of AI scribe software for clinical note transcription with the patient, who gave verbal consent to proceed.  History of Present Illness  Janice Pace is a 78 year old female undergoing chemotherapy who presents with a dry, itchy skin rash.  She has been experiencing a dry, itchy skin rash that is different from previous episodes, describing it as 'real dry.' She attempted to use  lotion after the rash appeared but found it ineffective. She also tried using a 1% cortisone cream, as previously advised, but it did not provide relief. The rash is associated with significant itching.  She has not used any Benadryl  by mouth or topical Benadryl  cream for the rash yet. She is currently undergoing chemotherapy. She has dexamethasone  at home.  Her recent blood work shows a white blood cell count of 3,700, hemoglobin at 9.1, and normal platelet levels. She is awaiting results for her chemistries and magnesium  levels, as her magnesium  was low previously. No nausea or vomiting and her appetite is described as 'so-so.'    I have reviewed the past medical history, past surgical history, social history and family history with the patient and they are unchanged from previous note.  HISTORY OF PRESENT ILLNESS:   ONCOLOGY HISTORY:   78 y.o. lady with past medical history of essential hypertension, osteoarthritis, COPD/asthma, GERD, depression, anxiety, hiatal hernia, chronic back pain presented to the ED on 12/14/2023 with complaints of worsening epigastric/abdominal pain over 2-3 weeks.  Routine blood work was unremarkable in the ER.  However CT abdomen and pelvis showed irregular appearance of the gallbladder with suggestion of capsular thickening along the fundus of the gallbladder.  Gallbladder appeared inseparable from the adjacent portion of the liver.  Clinical picture was concerning for gallbladder carcinoma.  Mesenteric nodular thickening along the mid lower abdominal cavity indicating peritoneal carcinomatosis.  Soft tissue nodular mass in the left lower quadrant region of  the pelvis measuring 3.6 x 2.7 cm, likely ovarian neoplastic lesion.  Multiple lung nodules in the lower lobes bilaterally, largest measuring 12 mm in the right lower lobe, concerning for pulmonary metastatic disease.   She was admitted to the hospital for further evaluation and management.  IR was consulted for  peritoneal mass biopsy.  CA 19-9 was significant elevated at 14,368.  CEA was increased at 98.7, CA-125 was also increased at 91.3.   On 12/14/2023, MRI abdomen/MRCP showed thickened, contracted gallbladder with a heterogeneous mass arising from the gallbladder fundus, contiguous with the adjacent liver parenchyma of hepatic segment IVB measuring 3.5 x 2.8 cm. Multiple small rim enhancing lesions within the liver parenchyma. Enlarged, necrotic appearing portacaval and porta hepatis lymph nodes. Small, although rim enhancing and abnormal appearing retroperitoneal lymph nodes. Peritoneal stranding and nodularity, incompletely visualized on this examination although present in the left upper quadrant, consistent with peritoneal metastatic disease. Numerous small bilateral pulmonary nodules, better assessed by CT. 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   On 12/15/2023, she underwent CT-guided biopsy of the omental lesion.  Pathology showed metastatic well to moderately differentiated adenocarcinoma. The tumor is positive for cytokeratin 7 and shows focal weak positivity for the GI marker CDX2.  The tumor is negative for the GI markers cytokeratin 20.  The tumor is also negative for the GU and GYN marker PAX8.  The tumor is negative for the pulmonary adeno marker TTF-1. This immunohistochemical pattern and histomorphology would be compatible with the clinical suspicion of a gallbladder primary.  The differential diagnosis would also include an upper GI or pancreaticobiliary primary.    Patient presented to our clinic on 12/24/2023 to establish care with us .  Request placed for staging PET scan.  NGS panel testing requested on the specimen and also liquid biopsy obtained.  It showed no actionable mutations.  Staging PET scan on 12/29/2023 showed known gallbladder mass extending into the liver.  Peritoneal carcinomatosis noted.  Numerous lung nodules were  identified, small bowel with FDG uptake, concerning for metastatic disease in the lungs.  Metastatic lymphadenopathy was noted in the abdomen and retroperitoneum.  This scan will serve as a good baseline for future compression.    Plan for palliative systemic chemoimmunotherapy with cisplatin , gemcitabine , durvalumab . Scheduled to start C1D1 from 01/01/24.   Restaging PET scan on 03/21/2024 showed overall improvement in disease.  Oncology History  Adenocarcinoma of gallbladder (HCC)  12/24/2023 Initial Diagnosis   Adenocarcinoma of gallbladder (HCC)   12/24/2023 Cancer Staging   Staging form: Gallbladder, AJCC 8th Edition - Clinical: Stage IVB (cTX, cNX, pM1) - Signed by Autumn Millman, MD on 12/24/2023 Histologic grade (G): G2 Histologic grading system: 3 grade system   01/01/2024 -  Chemotherapy   Patient is on Treatment Plan : BILIARY TRACT Cisplatin  + Gemcitabine  D1,8 + Durvalumab  (1500) D1 q21d / Durvalumab  (1500) q28d         REVIEW OF SYSTEMS:   Review of Systems - Oncology  All other pertinent systems were reviewed with the patient and are negative.  ALLERGIES: She is allergic to sulfa antibiotics, forteo  [parathyroid hormone (recomb)], atorvastatin, and metformin hcl.  MEDICATIONS:  Current Outpatient Medications  Medication Sig Dispense Refill   albuterol  (VENTOLIN  HFA) 108 (90 Base) MCG/ACT inhaler Inhale 1-2 puffs into the lungs every 6 (six) hours as needed for wheezing or shortness of breath. 8 g 1   aspirin  81 MG EC tablet Take 81 mg by mouth daily.  budesonide  (PULMICORT ) 0.5 MG/2ML nebulizer solution Generic for Pulmicort . Inhale one vial in nebulizer twice a day. Rinse mouth after use. 60 mL 11   dexamethasone  (DECADRON ) 4 MG tablet Take 2 tablets (8 mg) by mouth daily x 3 days starting the day after cisplatin  chemotherapy. Take with food. 30 tablet 1   docusate sodium  (COLACE) 100 MG capsule Take 500 mg by mouth at bedtime.     formoterol  (PERFOROMIST ) 20 MCG/2ML  nebulizer solution Substituted for: Perforomist  Solution Inhale one vial in nebulizer twice a day. 60 mL 11   lidocaine -prilocaine  (EMLA ) cream Apply to affected area once 30 g 3   magnesium  oxide (MAG-OX) 400 (240 Mg) MG tablet Take 1 tablet (400 mg total) by mouth 2 (two) times daily. 60 tablet 1   megestrol  (MEGACE  ES) 625 MG/5ML suspension Take 5 mLs (625 mg total) by mouth daily. 150 mL 3   methylPREDNISolone  (MEDROL  DOSEPAK) 4 MG TBPK tablet Take as directed on package instructions 21 tablet 1   metoprolol  succinate (TOPROL -XL) 50 MG 24 hr tablet TAKE 1 TABLET BY MOUTH ONCE DAILY. TAKE WITH OR IMMEDIATELY FOLLOWING A MEAL 90 tablet 1   morphine  (MS CONTIN ) 15 MG 12 hr tablet Take 1 tablet (15 mg total) by mouth every 12 (twelve) hours. 60 tablet 0   omeprazole  (PRILOSEC) 20 MG capsule Take 1 capsule (20 mg total) by mouth 2 (two) times daily before a meal. 60 capsule 3   ondansetron  (ZOFRAN ) 4 MG tablet Take 1 tablet (4 mg total) by mouth every 6 (six) hours as needed for nausea. 30 tablet 0   ondansetron  (ZOFRAN ) 8 MG tablet Take 1 tablet (8 mg total) by mouth every 8 (eight) hours as needed for nausea or vomiting. Start on the third day after cisplatin . 30 tablet 1   oxyCODONE  10 MG TABS Take 1-1.5 tablets (10-15 mg total) by mouth every 4 (four) hours as needed for moderate pain (pain score 4-6) or severe pain (pain score 7-10). (Patient taking differently: Take 10-15 mg by mouth every 4 (four) hours as needed for moderate pain (pain score 4-6) or severe pain (pain score 7-10). Pt reports she takes this once in awhile) 60 tablet 0   polyethylene glycol (MIRALAX  / GLYCOLAX ) 17 g packet Take 17 g by mouth 2 (two) times daily.     prochlorperazine  (COMPAZINE ) 10 MG tablet Take 1 tablet (10 mg total) by mouth every 6 (six) hours as needed (Nausea or vomiting). 30 tablet 1   revefenacin  (YUPELRI ) 175 MCG/3ML nebulizer solution Inhale one vial in nebulizer once daily. Do not mix with other nebulized  medications. 30 mL 11   rosuvastatin  (CRESTOR ) 10 MG tablet Take 1 tablet by mouth once daily 90 tablet 0   spironolactone  (ALDACTONE ) 25 MG tablet Take 25 mg by mouth daily.     No current facility-administered medications for this visit.     VITALS:   Blood pressure 138/72, pulse 71, temperature 97.8 F (36.6 C), resp. rate 17, weight 163 lb (73.9 kg), SpO2 99%.  Wt Readings from Last 3 Encounters:  03/31/24 163 lb (73.9 kg)  03/24/24 164 lb (74.4 kg)  03/11/24 164 lb (74.4 kg)    Body mass index is 29.81 kg/m.    Onc Performance Status - 03/31/24 1200       ECOG Perf Status   ECOG Perf Status Restricted in physically strenuous activity but ambulatory and able to carry out work of a light or sedentary nature, e.g., light house work,  office work      KPS SCALE   KPS % SCORE Able to carry on normal activity, minor s/s of disease              PHYSICAL EXAM:   Physical Exam Constitutional:      General: She is not in acute distress.    Appearance: Normal appearance.  HENT:     Head: Normocephalic and atraumatic.  Cardiovascular:     Rate and Rhythm: Normal rate.  Pulmonary:     Effort: Pulmonary effort is normal. No respiratory distress.  Abdominal:     General: There is no distension.  Neurological:     General: No focal deficit present.     Mental Status: She is alert and oriented to person, place, and time.  Psychiatric:        Mood and Affect: Mood normal.        Behavior: Behavior normal.       LABORATORY DATA:   I have reviewed the data as listed.  Results for orders placed or performed in visit on 03/31/24  Magnesium   Result Value Ref Range   Magnesium  1.6 (L) 1.7 - 2.4 mg/dL  CMP (Cancer Center only)  Result Value Ref Range   Sodium 140 135 - 145 mmol/L   Potassium 3.9 3.5 - 5.1 mmol/L   Chloride 109 98 - 111 mmol/L   CO2 27 22 - 32 mmol/L   Glucose, Bld 106 (H) 70 - 99 mg/dL   BUN 10 8 - 23 mg/dL   Creatinine 9.17 9.55 - 1.00 mg/dL    Calcium  8.5 (L) 8.9 - 10.3 mg/dL   Total Protein 5.9 (L) 6.5 - 8.1 g/dL   Albumin  3.5 3.5 - 5.0 g/dL   AST 31 15 - 41 U/L   ALT 24 0 - 44 U/L   Alkaline Phosphatase 169 (H) 38 - 126 U/L   Total Bilirubin 0.7 0.0 - 1.2 mg/dL   GFR, Estimated >39 >39 mL/min   Anion gap 4 (L) 5 - 15  CBC with Differential (Cancer Center Only)  Result Value Ref Range   WBC Count 3.7 (L) 4.0 - 10.5 K/uL   RBC 2.81 (L) 3.87 - 5.11 MIL/uL   Hemoglobin 9.1 (L) 12.0 - 15.0 g/dL   HCT 73.8 (L) 63.9 - 53.9 %   MCV 92.9 80.0 - 100.0 fL   MCH 32.4 26.0 - 34.0 pg   MCHC 34.9 30.0 - 36.0 g/dL   RDW 82.0 (H) 88.4 - 84.4 %   Platelet Count 190 150 - 400 K/uL   nRBC 0.0 0.0 - 0.2 %   Neutrophils Relative % 53 %   Neutro Abs 2.0 1.7 - 7.7 K/uL   Lymphocytes Relative 24 %   Lymphs Abs 0.9 0.7 - 4.0 K/uL   Monocytes Relative 21 %   Monocytes Absolute 0.8 0.1 - 1.0 K/uL   Eosinophils Relative 1 %   Eosinophils Absolute 0.0 0.0 - 0.5 K/uL   Basophils Relative 1 %   Basophils Absolute 0.0 0.0 - 0.1 K/uL   Immature Granulocytes 0 %   Abs Immature Granulocytes 0.01 0.00 - 0.07 K/uL       RADIOGRAPHIC STUDIES:  I have personally reviewed the radiological images as listed and agree with the findings in the report.  NM PET Image Restag (PS) Skull Base To Thigh Result Date: 03/21/2024 EXAM: PET AND CT SKULL BASE TO MID THIGH 03/21/2024 01:07:18 PM TECHNIQUE: PET imaging was acquired from the base  of the skull to the mid thighs. Non-contrast enhanced computed tomography was obtained for attenuation correction and anatomic localization. RADIOPHARMACEUTICAL: 8.2 mCi F-18 FDG Uptake time 60 minutes. Glucose level 103 mg/dl. COMPARISON: PET of 12/28/2023. CLINICAL HISTORY: Restaging stage IV adenocarcinoma of gallbladder. FINDINGS: HEAD AND NECK: No cervical nodal hypermetabolism. No cervical adenopathy. Left carotid atherosclerosis. CHEST: No thoracic nodal hypermetabolism. Index right upper lobe pulmonary nodule measures  7 mm and SUV 2.0 on image 23/7 versus 8 mm and suv 3.7 previously. SABRA An index superior segment left lower lobe nodule on the prior exam that measured 6 mm and SUV 3.3 has essentially resolved. No residual hypermetabolism in this area. Innumerable smaller pulmonary nodules are also less distinct today, for example within the right lower lobe at 7 mm in image 46/7 versus 10 mm on the prior exam. ABDOMEN AND PELVIS: Hypermetabolism along the gallbladder fundus and extending into segment 4b of the liver is less distinct today, for example at SUV 7.1 on image 116/4, similar to SUV 6.8 on the prior exam. Porta hepatis node measures 1.0 cm and SUV 5.4 on image 108/4 compared to 1.2 cm and SUV 5.1 on the prior exam. The abdominal retroperitoneal hypermetabolic nodes have resolved. Anterior inferior omental hypermetabolic implant measures 2.7 x 1.7 cm and SUV 4.9 on image 153/4 compared to 2.9 x 1.6 cm and SUV 5.3 on the prior exam. Smaller more cephalad abdominal omental implants are similar. Left pelvic/adnexal implant measures 4.8 x 2.6 cm and SUV 5.6 on image 163/4 versus 4.2 x 3.0 cm and SUV 17.1 on the prior exam. Diffuse colonic hypermetabolism, most significant in the low rectum and anus at SUV 14.9, no CT correlate. Normal adrenal glands. Abdominal aortic atherosclerosis. Hysterectomy. Pelvic floor laxity. BONES AND SOFT TISSUE: No abnormal FDG activity localizes to the bones. No metabolically active aggressive osseous lesion. Thoracolumbar spine fixation. VASCULATURE: Right port-a-cath tip mid right atrium. Aortic and coronary artery calcifications. IMPRESSION: 1. Minimal response to therapy, as evidenced by decreased size and hypermetabolism of Pulmonary Metastasis, gallbladder primary and abdominal nodal metastasis. Omental and left pelvic peritoneal/adnexal implants are relatively similar. 2.  No new sites of disease. 3. Anorectal hypermetabolism is most likely physiologic 4. Incidental findings, including:  Aortic atherosclerosis (icd10-i70.0). Coronary artery atherosclerosis. Electronically signed by: Rockey Kilts MD 03/21/2024 01:33 PM EDT RP Workstation: [id number]     CODE STATUS:  Code Status History     Date Active Date Inactive Code Status Order ID Comments User Context   12/14/2023 1601 12/16/2023 2004 Limited: Do not attempt resuscitation (DNR) -DNR-LIMITED -Do Not Intubate/DNI  [id number]  Caleen Burgess BROCKS, MD ED   09/17/2022 1236 09/18/2022 1519 Full Code [id number]  Onetha Kuba, MD Inpatient   02/27/2021 1602 03/09/2021 2325 Full Code [id number]  Onetha Kuba, MD Inpatient   06/27/2020 1110 06/28/2020 0504 Full Code [id number]  Derrill Dawn, MD HOV    Questions for Most Recent Historical Code Status (Order [id number])     Question Answer   If pulseless and not breathing No CPR or chest compressions.   In Pre-Arrest Conditions (Patient Is Breathing and Has A Pulse) Do not intubate. Provide all appropriate non-invasive medical interventions. Avoid ICU transfer unless indicated or required.   Consent: Discussion documented in EHR or advanced directives reviewed                Advance Directive Documentation    Flowsheet Row Most Recent Value  Type of Advance Directive Healthcare Power of [place], Living will  Pre-existing out of facility DNR order (yellow form or pink MOST form) --  MOST Form in Place? --    No orders of the defined types were placed in this encounter.    Future Appointments  Date Time Provider Department Center  04/01/2024 10:00 AM [facility] INFUSION [facility] None  04/01/2024 11:15 AM Ivonne Harlene RAMAN, RD [facility] None  04/07/2024  8:15 AM [facility] FLUSH [facility] None  04/07/2024  8:45 AM Daziyah Cogan, Chinita, MD [facility] None  04/08/2024  9:30 AM [facility] INFUSION [facility] None  04/11/2024  1:15 PM Kara Dorn NOVAK, MD [facility] [address]  04/11/2024  2:45 PM [facility] FLUSH [facility] None  04/21/2024  8:30 AM [facility] FLUSH [facility]  None  04/21/2024  9:00 AM Alayzia Pavlock, MD [facility] None  04/22/2024  9:30 AM [facility] INFUSION [facility] None  04/28/2024  9:15 AM [facility] FLUSH [facility] None  04/28/2024  9:45 AM Katheryn Culliton, MD [facility] None  04/29/2024 10:00 AM [facility] INFUSION [facility] None  05/02/2024  1:30 PM [facility] FLUSH [facility] None      This document was completed utilizing speech recognition software. Grammatical errors, random word insertions, pronoun errors, and incomplete sentences are an occasional consequence of this system due to software limitations, ambient noise, and hardware issues. Any formal questions or concerns about the content, text or information contained within the body of this dictation should be directly addressed to the provider for clarification.

## 2024-03-31 NOTE — Assessment & Plan Note (Addendum)
 Please review oncology history for additional details and timeline of events.  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 Biopsy from the omentum confirmed adenocarcinoma, originating from the gallbladder.  Immunostains support diagnosis of gallbladder carcinoma.    Previously I discussed diagnosis, staging, prognosis, plan of care, treatment options.  Reviewed NCCN guidelines. Given stage IV disease, all treatment options are palliative in nature and not curative in intent.  This was discussed with patient, her daughter and son-in-law who were accompanying.  They verbalized understanding.    Discussed the aggressive nature of stage IV gallbladder adenocarcinoma and overall poor prognosis.  Life expectancy with treatment is 1 to 1.5 years; without treatment, less than six months.   NGS panel testing on the specimen and also liquid biopsy for mutation analysis showed no actionable mutations.  Staging PET scan on 12/29/2023 showed known gallbladder mass extending into the liver.  Peritoneal carcinomatosis noted.  Numerous lung nodules were identified, small bowel with FDG uptake, concerning for metastatic disease in the lungs.  Metastatic lymphadenopathy was noted in the abdomen and retroperitoneum.  This scan will serve as a good baseline for future compression.  Discussed results with the patient and family members previously.   Plan made to proceed with palliative systemic chemoimmunotherapy with gemcitabine , cisplatin  and durvalumab .  She started this from 01/01/2024.  Tolerating chemo well so far.  No major side effects.  After her last chemotherapy, she presented to the ED with complaints of facial swelling and redness.  It resolved with Medrol  Dosepak.  Symptoms lasted for 1 to 2 days.  Of note, patient also received her first ever blood transfusion around the same time as last chemotherapy.  Completed 4 cycles of chemotherapy so far.  Restaging PET scan on 03/21/2024  showed overall improvement with improvement in the lesions in the lungs, lymph nodes, and omentum.   Plan is to continue current management for up to 8 cycles.  If cytopenias or other side effects are prohibitive, we will plan for at least 6 cycles.  Last week chemotherapy had to be held because of anemia with hemoglobin of 7.2, leukopenia with white count of 3000.  She received 1 unit of PRBC transfusion.  Hemoglobin is better at 9.1 today.  White count is also better at 3700 with ANC of 2000.  Will proceed with cycle 5-day 1 of chemotherapy tomorrow as scheduled.  Previously we did not perform genetic testing.  Referral sent to genetic counselor.  We will obtain blood samples for genetic testing next week.

## 2024-04-01 ENCOUNTER — Inpatient Hospital Stay: Admitting: Dietician

## 2024-04-01 ENCOUNTER — Inpatient Hospital Stay

## 2024-04-01 VITALS — BP 181/66 | HR 73 | Temp 98.6°F | Resp 18

## 2024-04-01 DIAGNOSIS — Z5112 Encounter for antineoplastic immunotherapy: Secondary | ICD-10-CM | POA: Diagnosis not present

## 2024-04-01 DIAGNOSIS — C23 Malignant neoplasm of gallbladder: Secondary | ICD-10-CM

## 2024-04-01 LAB — CANCER ANTIGEN 19-9: CA 19-9: 8324 U/mL — ABNORMAL HIGH (ref 0–35)

## 2024-04-01 MED ORDER — SODIUM CHLORIDE 0.9 % IV SOLN
25.0000 mg/m2 | Freq: Once | INTRAVENOUS | Status: AC
  Administered 2024-04-01: 50 mg via INTRAVENOUS
  Filled 2024-04-01: qty 50

## 2024-04-01 MED ORDER — SODIUM CHLORIDE 0.9% FLUSH
10.0000 mL | INTRAVENOUS | Status: DC | PRN
  Administered 2024-04-01: 10 mL

## 2024-04-01 MED ORDER — PALONOSETRON HCL INJECTION 0.25 MG/5ML
0.2500 mg | Freq: Once | INTRAVENOUS | Status: AC
  Administered 2024-04-01: 0.25 mg via INTRAVENOUS
  Filled 2024-04-01: qty 5

## 2024-04-01 MED ORDER — SODIUM CHLORIDE 0.9 % IV SOLN
1000.0000 mg/m2 | Freq: Once | INTRAVENOUS | Status: AC
  Administered 2024-04-01: 1786 mg via INTRAVENOUS
  Filled 2024-04-01: qty 46.97

## 2024-04-01 MED ORDER — MAGNESIUM SULFATE 2 GM/50ML IV SOLN
2.0000 g | Freq: Once | INTRAVENOUS | Status: AC
  Administered 2024-04-01: 2 g via INTRAVENOUS
  Filled 2024-04-01: qty 50

## 2024-04-01 MED ORDER — DEXAMETHASONE SODIUM PHOSPHATE 10 MG/ML IJ SOLN
10.0000 mg | Freq: Once | INTRAMUSCULAR | Status: AC
  Administered 2024-04-01: 10 mg via INTRAVENOUS
  Filled 2024-04-01: qty 1

## 2024-04-01 MED ORDER — APREPITANT 130 MG/18ML IV EMUL
130.0000 mg | Freq: Once | INTRAVENOUS | Status: AC
  Administered 2024-04-01: 130 mg via INTRAVENOUS
  Filled 2024-04-01: qty 18

## 2024-04-01 MED ORDER — SODIUM CHLORIDE 0.9 % IV SOLN
1500.0000 mg | Freq: Once | INTRAVENOUS | Status: AC
  Administered 2024-04-01: 1500 mg via INTRAVENOUS
  Filled 2024-04-01: qty 30

## 2024-04-01 MED ORDER — SODIUM CHLORIDE 0.9 % IV SOLN: INTRAVENOUS | Status: DC

## 2024-04-01 MED ORDER — POTASSIUM CHLORIDE IN NACL 20-0.9 MEQ/L-% IV SOLN
Freq: Once | INTRAVENOUS | Status: AC
  Filled 2024-04-01: qty 1000

## 2024-04-01 NOTE — Patient Instructions (Signed)
 [facility] - A DEPT OF [facility]  Discharge Instructions: Thank you for choosing [facility] to provide your oncology and hematology care.   If you have a lab appointment with the Cancer Center, please go directly to the Cancer Center and check in at the registration area.   Wear comfortable clothing and clothing appropriate for easy access to any Portacath or PICC line.   We strive to give you quality time with your provider. You may need to reschedule your appointment if you arrive late (15 or more minutes).  Arriving late affects you and other patients whose appointments are after yours.  Also, if you miss three or more appointments without notifying the office, you may be dismissed from the clinic at the provider's discretion.      For prescription refill requests, have your pharmacy contact our office and allow 72 hours for refills to be completed.    Today you received the following chemotherapy and/or immunotherapy agents imfinzi, gemzar, cisplatin      To help prevent nausea and vomiting after your treatment, we encourage you to take your nausea medication as directed.  BELOW ARE SYMPTOMS THAT SHOULD BE REPORTED IMMEDIATELY: *FEVER GREATER THAN 100.4 F (38 C) OR HIGHER *CHILLS OR SWEATING *NAUSEA AND VOMITING THAT IS NOT CONTROLLED WITH YOUR NAUSEA MEDICATION *UNUSUAL SHORTNESS OF BREATH *UNUSUAL BRUISING OR BLEEDING *URINARY PROBLEMS (pain or burning when urinating, or frequent urination) *BOWEL PROBLEMS (unusual diarrhea, constipation, pain near the anus) TENDERNESS IN MOUTH AND THROAT WITH OR WITHOUT PRESENCE OF ULCERS (sore throat, sores in mouth, or a toothache) UNUSUAL RASH, SWELLING OR PAIN  UNUSUAL VAGINAL DISCHARGE OR ITCHING   Items with * indicate a potential emergency and should be followed up as soon as possible or go to the Emergency Department if any problems should occur.  Please show the CHEMOTHERAPY ALERT CARD  or IMMUNOTHERAPY ALERT CARD at check-in to the Emergency Department and triage nurse.  Should you have questions after your visit or need to cancel or reschedule your appointment, please contact [facility] - A DEPT OF [facility]  Dept: [phone]  and follow the prompts.  Office hours are 8:00 a.m. to 4:30 p.m. Monday - Friday. Please note that voicemails left after 4:00 p.m. may not be returned until the following business day.  We are closed weekends and major holidays. You have access to a nurse at all times for urgent questions. Please call the main number to the clinic Dept: [phone] and follow the prompts.   For any non-urgent questions, you may also contact your provider using MyChart. We now offer e-Visits for anyone 22 and older to request care online for non-urgent symptoms. For details visit [email or web address].   Also download the MyChart app! Go to the app store, search "MyChart", open the app, select [place], and log in with your MyChart username and password.

## 2024-04-01 NOTE — Progress Notes (Signed)
 Nutrition Follow-up:  Pt with stage IV bladder cancer mets to peritoneum and lungs. She is receiving palliative chemoimmunotherapy with cisplatin /gemcitabine  + imfinzi  (first 6/20). Patient is under the care of Dr. Autumn   Met with patient in infusion. She is doing well today. Happy about recent imaging that shows good response to treatment. Appetite comes and goes. She uses appetite stimulant as needed - maybe once every 3 weeks. Patient continues to crave [employer]. Has liked [employer] recently as well. Patient has decreased intake of Pepsi. If she drinks one, its usually just a few sips. Recalls tomato juice, gatorade, ginger ale, sprite. Patient does not like water. Patient wears relief band for nausea. Has not needed to take any antiemetics. Intermittent constipation managed with miralax .    Medications: reviewed   Labs: Mg 1.6 (IV repletion today)  Anthropometrics: 163 lb on 9/18  8/29 - 164 lb 7/31 - 166 lb 12.8 oz  6/26 - 164 lb 14.4 oz  NUTRITION DIAGNOSIS: Food and nutrition related knowledge deficit - improved    INTERVENTION:  Continue megace  for appetite as needed Continue high calorie high protein foods for wt maintenance    MONITORING, EVALUATION, GOAL: wt trends, intake   NEXT VISIT: To be scheduled as needed

## 2024-04-02 ENCOUNTER — Other Ambulatory Visit: Payer: Self-pay | Admitting: Oncology

## 2024-04-02 DIAGNOSIS — G893 Neoplasm related pain (acute) (chronic): Secondary | ICD-10-CM

## 2024-04-04 ENCOUNTER — Inpatient Hospital Stay

## 2024-04-06 ENCOUNTER — Other Ambulatory Visit (HOSPITAL_COMMUNITY): Payer: Self-pay

## 2024-04-06 ENCOUNTER — Encounter: Payer: Self-pay | Admitting: Oncology

## 2024-04-06 MED ORDER — MORPHINE SULFATE ER 15 MG PO TBCR
15.0000 mg | EXTENDED_RELEASE_TABLET | Freq: Two times a day (BID) | ORAL | 0 refills | Status: DC
  Filled 2024-04-06: qty 60, 30d supply, fill #0

## 2024-04-07 ENCOUNTER — Other Ambulatory Visit: Payer: Self-pay | Admitting: Oncology

## 2024-04-07 ENCOUNTER — Encounter: Payer: Self-pay | Admitting: Oncology

## 2024-04-07 ENCOUNTER — Ambulatory Visit

## 2024-04-07 ENCOUNTER — Ambulatory Visit: Admitting: Oncology

## 2024-04-07 ENCOUNTER — Other Ambulatory Visit: Payer: Self-pay

## 2024-04-07 VITALS — BP 154/71 | HR 65 | Temp 97.9°F | Resp 18 | Ht 62.0 in | Wt 158.9 lb

## 2024-04-07 DIAGNOSIS — C23 Malignant neoplasm of gallbladder: Secondary | ICD-10-CM | POA: Diagnosis not present

## 2024-04-07 DIAGNOSIS — T451X5A Adverse effect of antineoplastic and immunosuppressive drugs, initial encounter: Secondary | ICD-10-CM

## 2024-04-07 DIAGNOSIS — Z5112 Encounter for antineoplastic immunotherapy: Secondary | ICD-10-CM | POA: Diagnosis not present

## 2024-04-07 DIAGNOSIS — E876 Hypokalemia: Secondary | ICD-10-CM | POA: Diagnosis not present

## 2024-04-07 DIAGNOSIS — D701 Agranulocytosis secondary to cancer chemotherapy: Secondary | ICD-10-CM | POA: Diagnosis not present

## 2024-04-07 LAB — CBC WITH DIFFERENTIAL (CANCER CENTER ONLY)
Abs Immature Granulocytes: 0.01 K/uL (ref 0.00–0.07)
Basophils Absolute: 0 K/uL (ref 0.0–0.1)
Basophils Relative: 0 %
Eosinophils Absolute: 0 K/uL (ref 0.0–0.5)
Eosinophils Relative: 0 %
HCT: 25.6 % — ABNORMAL LOW (ref 36.0–46.0)
Hemoglobin: 8.9 g/dL — ABNORMAL LOW (ref 12.0–15.0)
Immature Granulocytes: 0 %
Lymphocytes Relative: 40 %
Lymphs Abs: 1 K/uL (ref 0.7–4.0)
MCH: 31.8 pg (ref 26.0–34.0)
MCHC: 34.8 g/dL (ref 30.0–36.0)
MCV: 91.4 fL (ref 80.0–100.0)
Monocytes Absolute: 0.2 K/uL (ref 0.1–1.0)
Monocytes Relative: 6 %
Neutro Abs: 1.3 K/uL — ABNORMAL LOW (ref 1.7–7.7)
Neutrophils Relative %: 54 %
Platelet Count: 102 K/uL — ABNORMAL LOW (ref 150–400)
RBC: 2.8 MIL/uL — ABNORMAL LOW (ref 3.87–5.11)
RDW: 16.2 % — ABNORMAL HIGH (ref 11.5–15.5)
WBC Count: 2.5 K/uL — ABNORMAL LOW (ref 4.0–10.5)
nRBC: 0 % (ref 0.0–0.2)

## 2024-04-07 LAB — CMP (CANCER CENTER ONLY)
ALT: 50 U/L — ABNORMAL HIGH (ref 0–44)
AST: 50 U/L — ABNORMAL HIGH (ref 15–41)
Albumin: 3.6 g/dL (ref 3.5–5.0)
Alkaline Phosphatase: 166 U/L — ABNORMAL HIGH (ref 38–126)
Anion gap: 7 (ref 5–15)
BUN: 16 mg/dL (ref 8–23)
CO2: 26 mmol/L (ref 22–32)
Calcium: 8.5 mg/dL — ABNORMAL LOW (ref 8.9–10.3)
Chloride: 107 mmol/L (ref 98–111)
Creatinine: 0.92 mg/dL (ref 0.44–1.00)
GFR, Estimated: >60 mL/min (ref >60)
Glucose, Bld: 112 mg/dL — ABNORMAL HIGH (ref 70–99)
Potassium: 3.3 mmol/L — ABNORMAL LOW (ref 3.5–5.1)
Sodium: 140 mmol/L (ref 135–145)
Total Bilirubin: 0.7 mg/dL (ref 0.0–1.2)
Total Protein: 6 g/dL — ABNORMAL LOW (ref 6.5–8.1)

## 2024-04-07 LAB — MAGNESIUM: Magnesium: 1.4 mg/dL — ABNORMAL LOW (ref 1.7–2.4)

## 2024-04-07 NOTE — Progress Notes (Signed)
 [facility]  ONCOLOGY CLINIC PROGRESS NOTE   Patient Care Team: [name] as PCP - General (Physician Assistant) Pcp, [name], DO (Family Medicine) Pa, [facility] [name], RN as Oncology Nurse Navigator  PATIENT NAME: Janice Pace   MR#: [id number] DOB: Oct 10, 1945  Date of visit: 04/07/2024   ASSESSMENT & PLAN:   Janice Pace is a 78 y.o. 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   Adenocarcinoma of gallbladder [facility]) Please review oncology history for additional details and timeline of events.  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 Biopsy from the omentum confirmed adenocarcinoma, originating from the gallbladder.  Immunostains support diagnosis of gallbladder carcinoma.    Previously I discussed diagnosis, staging, prognosis, plan of care, treatment options.  Reviewed NCCN guidelines. Given stage IV disease, all treatment options are palliative in nature and not curative in intent.  This was discussed with patient, her daughter and son-in-law who were accompanying.  They verbalized understanding.    Discussed the aggressive nature of stage IV gallbladder adenocarcinoma and overall poor prognosis.  Life expectancy with treatment is 1 to 1.5 years; without treatment, less than six months.   NGS panel testing on the specimen and also liquid biopsy for mutation analysis showed no actionable mutations.  Staging PET scan on 12/29/2023 showed known gallbladder mass extending into the liver.  Peritoneal carcinomatosis noted.  Numerous lung nodules were identified, small bowel with FDG uptake, concerning for metastatic disease in the lungs.   Metastatic lymphadenopathy was noted in the abdomen and retroperitoneum.  This scan will serve as a good baseline for future compression.  Discussed results with the patient and family members previously.   Plan made to proceed with palliative systemic chemoimmunotherapy with gemcitabine , cisplatin  and durvalumab .  She started this from 01/01/2024.  Tolerating chemo well so far.  No major side effects except for cytopenias.  Following completion of 4 cycles of chemotherapy, restaging PET scan on 03/21/2024 showed overall improvement with improvement in the lesions in the lungs, lymph nodes, and omentum.   Plan is to continue current management for up to 8 cycles.  If cytopenias or other side effects are prohibitive, we will plan for at least 6 cycles.  She is due for cycle 5-day 8 of chemotherapy tomorrow.  Labs reveal leukopenia with white count of 2500 but ANC is 1300.  Platelet count of 102,000.  Hemoglobin 8.9.  Potassium 3.3, magnesium  1.4.  Overall no dose-limiting toxicities.  We will proceed with cycle 5-day 8 tomorrow.  She will receive extra potassium and extra magnesium  with fluids tomorrow and G-CSF support on 04/11/2024.  Previously we sent a referral to genetic counselor for genetic testing.  RTC in 2 weeks for labs, office visit and continuation of chemotherapy.  Leukopenia due to antineoplastic chemotherapy ANC permissible to proceed with chemotherapy tomorrow.  She will receive G-CSF support on 04/11/2024 as scheduled.  In the future, we will not skip G-CSF, regardless of her white count, as she has been having leukopenia/neutropenia whenever we skip G-CSF.  Hypomagnesemia Experiencing hypomagnesemia secondary to chemotherapy. Currently taking magnesium  supplements twice daily without adverse effects such as diarrhea. - Continue magnesium  supplementation twice daily -She will receive extra magnesium  of 4 g total  tomorrow with IV fluids, as she has persistent hypomagnesemia. -  Monitor magnesium  levels and adjust supplementation as needed  Hypokalemia Potassium low at 3.3 today.  Will give her a total of 40 mEq of potassium tomorrow  Sore throat Reports a sore throat that started last week and is intermittent. No associated cough, fever, or significant difficulty swallowing. Examination did not reveal any visible irritation, suggesting the issue may be deeper in the throat. Lozenges have provided relief. - Continue using lozenges for symptomatic relief  Facial swelling and erythema likely drug or transfusion reaction Experiencing a dry, itchy rash likely related to chemotherapy. Previous use of 1% cortisone cream was ineffective. - Advise taking Benadryl  25 mg by mouth at night due to its sedative effects. - Apply Benadryl  topical cream twice a day for itching. - Use a thick moisturizer like Vanicream. - Take Decadron  for three days after chemotherapy. - Consider referral to a dermatologist if the rash persists.  I reviewed lab results and outside records for this visit and discussed relevant results with the patient. Diagnosis, plan of care and treatment options were also discussed in detail with the patient. Opportunity provided to ask questions and answers provided to her apparent satisfaction. Provided instructions to call our clinic with any problems, questions or concerns prior to return visit. I recommended to continue follow-up with PCP and sub-specialists. She verbalized understanding and agreed with the plan.   NCCN guidelines have been consulted in the planning of this patient's care.  I spent a total of 40 minutes during this encounter with the patient including review of chart and various tests results, discussions about plan of care and coordination of care plan.  Janice Patten, MD  04/07/2024 10:03 AM  [facility] - [facility] [address] [place] [address] Dept:  [phone] Dept Fax: [phone]    CHIEF COMPLAINT/ REASON FOR VISIT:   Stage IV gallbladder adenocarcinoma with peritoneal mets and lung mets.  Current Treatment: Palliative systemic chemoimmunotherapy with cisplatin , gemcitabine , durvalumab  started from 01/01/2024.  INTERVAL HISTORY:    Discussed the use of AI scribe software for clinical note transcription with the patient, who gave verbal consent to proceed.  History of Present Illness  Janice Pace is a 78 year old female undergoing chemotherapy who presents with a sore throat.  She has been experiencing an intermittent sore throat for the past week. There is no associated cough, fever, or significant trouble swallowing, although she notes slight difficulty with swallowing. Lozenges have provided some relief.  She is currently undergoing chemotherapy, and her white blood cell count is low. She is receiving magnesium  supplementation twice daily at home without any issues such as diarrhea. Her hemoglobin level remains stable at 8.9, and she has not required a blood transfusion.  No congestion is present, and her facial redness has improved with the use of a moisturizer. She has not used any new medications like Benadryl  or cortisone cream recently. There is no vomiting.    I have reviewed the past medical history, past surgical history, social history and family history with the patient and they are unchanged from previous note.  HISTORY OF PRESENT ILLNESS:   ONCOLOGY HISTORY:   78 y.o. lady with past medical history of essential hypertension, osteoarthritis, COPD/asthma, GERD, depression, anxiety, hiatal hernia, chronic back pain presented to the ED on 12/14/2023 with complaints of worsening epigastric/abdominal pain over 2-3 weeks.  Routine  blood work was unremarkable in the ER.  However CT abdomen and pelvis showed irregular appearance of the gallbladder with suggestion of capsular thickening along the fundus of the  gallbladder.  Gallbladder appeared inseparable from the adjacent portion of the liver.  Clinical picture was concerning for gallbladder carcinoma.  Mesenteric nodular thickening along the mid lower abdominal cavity indicating peritoneal carcinomatosis.  Soft tissue nodular mass in the left lower quadrant region of the pelvis measuring 3.6 x 2.7 cm, likely ovarian neoplastic lesion.  Multiple lung nodules in the lower lobes bilaterally, largest measuring 12 mm in the right lower lobe, concerning for pulmonary metastatic disease.   She was admitted to the hospital for further evaluation and management.  IR was consulted for peritoneal mass biopsy.  CA 19-9 was significant elevated at 14,368.  CEA was increased at 98.7, CA-125 was also increased at 91.3.   On 12/14/2023, MRI abdomen/MRCP showed thickened, contracted gallbladder with a heterogeneous mass arising from the gallbladder fundus, contiguous with the adjacent liver parenchyma of hepatic segment IVB measuring 3.5 x 2.8 cm. Multiple small rim enhancing lesions within the liver parenchyma. Enlarged, necrotic appearing portacaval and porta hepatis lymph nodes. Small, although rim enhancing and abnormal appearing retroperitoneal lymph nodes. Peritoneal stranding and nodularity, incompletely visualized on this examination although present in the left upper quadrant, consistent with peritoneal metastatic disease. Numerous small bilateral pulmonary nodules, better assessed by CT. 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   On 12/15/2023, she underwent CT-guided biopsy of the omental lesion.  Pathology showed metastatic well to moderately differentiated adenocarcinoma. The tumor is positive for cytokeratin 7 and shows focal weak positivity for the GI marker CDX2.  The tumor is negative for the GI markers cytokeratin 20.  The tumor is also negative for the GU and GYN marker PAX8.  The tumor is negative for  the pulmonary adeno marker TTF-1. This immunohistochemical pattern and histomorphology would be compatible with the clinical suspicion of a gallbladder primary.  The differential diagnosis would also include an upper GI or pancreaticobiliary primary.    Patient presented to our clinic on 12/24/2023 to establish care with us .  Request placed for staging PET scan.  NGS panel testing requested on the specimen and also liquid biopsy obtained.  It showed no actionable mutations.  Staging PET scan on 12/29/2023 showed known gallbladder mass extending into the liver.  Peritoneal carcinomatosis noted.  Numerous lung nodules were identified, small bowel with FDG uptake, concerning for metastatic disease in the lungs.  Metastatic lymphadenopathy was noted in the abdomen and retroperitoneum.  This scan will serve as a good baseline for future compression.    Plan for palliative systemic chemoimmunotherapy with cisplatin , gemcitabine , durvalumab . Scheduled to start C1D1 from 01/01/24.   Restaging PET scan on 03/21/2024 showed overall improvement in disease.  Oncology History  Adenocarcinoma of gallbladder (HCC)  12/24/2023 Initial Diagnosis   Adenocarcinoma of gallbladder (HCC)   12/24/2023 Cancer Staging   Staging form: Gallbladder, AJCC 8th Edition - Clinical: Stage IVB (cTX, cNX, pM1) - Signed by Autumn Millman, MD on 12/24/2023 Histologic grade (G): G2 Histologic grading system: 3 grade system   01/01/2024 -  Chemotherapy   Patient is on Treatment Plan : BILIARY TRACT Cisplatin  + Gemcitabine  D1,8 + Durvalumab  (1500) D1 q21d / Durvalumab  (1500) q28d         REVIEW OF SYSTEMS:   Review of Systems - Oncology  All other pertinent systems were reviewed with the patient and  are negative.  ALLERGIES: She is allergic to sulfa antibiotics, forteo  [parathyroid hormone (recomb)], atorvastatin, and metformin hcl.  MEDICATIONS:  Current Outpatient Medications  Medication Sig Dispense Refill   albuterol   (VENTOLIN  HFA) 108 (90 Base) MCG/ACT inhaler Inhale 1-2 puffs into the lungs every 6 (six) hours as needed for wheezing or shortness of breath. 8 g 1   aspirin  81 MG EC tablet Take 81 mg by mouth daily.     budesonide  (PULMICORT ) 0.5 MG/2ML nebulizer solution Generic for Pulmicort . Inhale one vial in nebulizer twice a day. Rinse mouth after use. 60 mL 11   dexamethasone  (DECADRON ) 4 MG tablet Take 2 tablets (8 mg) by mouth daily x 3 days starting the day after cisplatin  chemotherapy. Take with food. 30 tablet 1   docusate sodium  (COLACE) 100 MG capsule Take 500 mg by mouth at bedtime.     formoterol  (PERFOROMIST ) 20 MCG/2ML nebulizer solution Substituted for: Perforomist  Solution Inhale one vial in nebulizer twice a day. 60 mL 11   lidocaine -prilocaine  (EMLA ) cream Apply to affected area once 30 g 3   magnesium  oxide (MAG-OX) 400 (240 Mg) MG tablet Take 1 tablet (400 mg total) by mouth 2 (two) times daily. 60 tablet 1   megestrol  (MEGACE  ES) 625 MG/5ML suspension Take 5 mLs (625 mg total) by mouth daily. 150 mL 3   methylPREDNISolone  (MEDROL  DOSEPAK) 4 MG TBPK tablet Take as directed on package instructions 21 tablet 1   metoprolol  succinate (TOPROL -XL) 50 MG 24 hr tablet TAKE 1 TABLET BY MOUTH ONCE DAILY. TAKE WITH OR IMMEDIATELY FOLLOWING A MEAL 90 tablet 1   morphine  (MS CONTIN ) 15 MG 12 hr tablet Take 1 tablet (15 mg total) by mouth every 12 (twelve) hours. 60 tablet 0   omeprazole  (PRILOSEC) 20 MG capsule Take 1 capsule (20 mg total) by mouth 2 (two) times daily before a meal. 60 capsule 3   ondansetron  (ZOFRAN ) 4 MG tablet Take 1 tablet (4 mg total) by mouth every 6 (six) hours as needed for nausea. 30 tablet 0   ondansetron  (ZOFRAN ) 8 MG tablet Take 1 tablet (8 mg total) by mouth every 8 (eight) hours as needed for nausea or vomiting. Start on the third day after cisplatin . 30 tablet 1   oxyCODONE  10 MG TABS Take 1-1.5 tablets (10-15 mg total) by mouth every 4 (four) hours as needed for  moderate pain (pain score 4-6) or severe pain (pain score 7-10). (Patient taking differently: Take 10-15 mg by mouth every 4 (four) hours as needed for moderate pain (pain score 4-6) or severe pain (pain score 7-10). Pt reports she takes this once in awhile) 60 tablet 0   polyethylene glycol (MIRALAX  / GLYCOLAX ) 17 g packet Take 17 g by mouth 2 (two) times daily.     prochlorperazine  (COMPAZINE ) 10 MG tablet Take 1 tablet (10 mg total) by mouth every 6 (six) hours as needed (Nausea or vomiting). 30 tablet 1   revefenacin  (YUPELRI ) 175 MCG/3ML nebulizer solution Inhale one vial in nebulizer once daily. Do not mix with other nebulized medications. 30 mL 11   rosuvastatin  (CRESTOR ) 10 MG tablet Take 1 tablet by mouth once daily 90 tablet 0   spironolactone  (ALDACTONE ) 25 MG tablet Take 25 mg by mouth daily.     No current facility-administered medications for this visit.    VITALS:   Blood pressure (!) 154/71, pulse 65, temperature 97.9 F (36.6 C), temperature source Oral, resp. rate 18, height 5' 2 (1.575 m), weight 158  lb 14.4 oz (72.1 kg), SpO2 100%.  Wt Readings from Last 3 Encounters:  04/07/24 158 lb 14.4 oz (72.1 kg)  03/31/24 163 lb (73.9 kg)  03/24/24 164 lb (74.4 kg)    Body mass index is 29.06 kg/m.    Onc Performance Status - 04/07/24 0843       ECOG Perf Status   ECOG Perf Status Restricted in physically strenuous activity but ambulatory and able to carry out work of a light or sedentary nature, e.g., light house work, office work      KPS SCALE   KPS % SCORE Normal activity with effort, some s/s of disease          PHYSICAL EXAM:   Physical Exam Constitutional:      General: She is not in acute distress.    Appearance: Normal appearance.  HENT:     Head: Normocephalic and atraumatic.  Cardiovascular:     Rate and Rhythm: Normal rate.  Pulmonary:     Effort: Pulmonary effort is normal. No respiratory distress.  Abdominal:     General: There is no  distension.  Neurological:     General: No focal deficit present.     Mental Status: She is alert and oriented to person, place, and time.  Psychiatric:        Mood and Affect: Mood normal.        Behavior: Behavior normal.       LABORATORY DATA:   I have reviewed the data as listed.  Results for orders placed or performed in visit on 04/07/24  Magnesium   Result Value Ref Range   Magnesium  1.4 (L) 1.7 - 2.4 mg/dL  CMP (Cancer Center only)  Result Value Ref Range   Sodium 140 135 - 145 mmol/L   Potassium 3.3 (L) 3.5 - 5.1 mmol/L   Chloride 107 98 - 111 mmol/L   CO2 26 22 - 32 mmol/L   Glucose, Bld 112 (H) 70 - 99 mg/dL   BUN 16 8 - 23 mg/dL   Creatinine 9.07 9.55 - 1.00 mg/dL   Calcium  8.5 (L) 8.9 - 10.3 mg/dL   Total Protein 6.0 (L) 6.5 - 8.1 g/dL   Albumin  3.6 3.5 - 5.0 g/dL   AST 50 (H) 15 - 41 U/L   ALT 50 (H) 0 - 44 U/L   Alkaline Phosphatase 166 (H) 38 - 126 U/L   Total Bilirubin 0.7 0.0 - 1.2 mg/dL   GFR, Estimated >39 >39 mL/min   Anion gap 7 5 - 15  CBC with Differential (Cancer Center Only)  Result Value Ref Range   WBC Count 2.5 (L) 4.0 - 10.5 K/uL   RBC 2.80 (L) 3.87 - 5.11 MIL/uL   Hemoglobin 8.9 (L) 12.0 - 15.0 g/dL   HCT 74.3 (L) 63.9 - 53.9 %   MCV 91.4 80.0 - 100.0 fL   MCH 31.8 26.0 - 34.0 pg   MCHC 34.8 30.0 - 36.0 g/dL   RDW 83.7 (H) 88.4 - 84.4 %   Platelet Count 102 (L) 150 - 400 K/uL   nRBC 0.0 0.0 - 0.2 %   Neutrophils Relative % 54 %   Neutro Abs 1.3 (L) 1.7 - 7.7 K/uL   Lymphocytes Relative 40 %   Lymphs Abs 1.0 0.7 - 4.0 K/uL   Monocytes Relative 6 %   Monocytes Absolute 0.2 0.1 - 1.0 K/uL   Eosinophils Relative 0 %   Eosinophils Absolute 0.0 0.0 - 0.5 K/uL   Basophils  Relative 0 %   Basophils Absolute 0.0 0.0 - 0.1 K/uL   Immature Granulocytes 0 %   Abs Immature Granulocytes 0.01 0.00 - 0.07 K/uL       RADIOGRAPHIC STUDIES:  I have personally reviewed the radiological images as listed and agree with the findings in the  report.  NM PET Image Restag (PS) Skull Base To Thigh Result Date: 03/21/2024 EXAM: PET AND CT SKULL BASE TO MID THIGH 03/21/2024 01:07:18 PM TECHNIQUE: PET imaging was acquired from the base of the skull to the mid thighs. Non-contrast enhanced computed tomography was obtained for attenuation correction and anatomic localization. RADIOPHARMACEUTICAL: 8.2 mCi F-18 FDG Uptake time 60 minutes. Glucose level 103 mg/dl. COMPARISON: PET of 12/28/2023. CLINICAL HISTORY: Restaging stage IV adenocarcinoma of gallbladder. FINDINGS: HEAD AND NECK: No cervical nodal hypermetabolism. No cervical adenopathy. Left carotid atherosclerosis. CHEST: No thoracic nodal hypermetabolism. Index right upper lobe pulmonary nodule measures 7 mm and SUV 2.0 on image 23/7 versus 8 mm and suv 3.7 previously. SABRA An index superior segment left lower lobe nodule on the prior exam that measured 6 mm and SUV 3.3 has essentially resolved. No residual hypermetabolism in this area. Innumerable smaller pulmonary nodules are also less distinct today, for example within the right lower lobe at 7 mm in image 46/7 versus 10 mm on the prior exam. ABDOMEN AND PELVIS: Hypermetabolism along the gallbladder fundus and extending into segment 4b of the liver is less distinct today, for example at SUV 7.1 on image 116/4, similar to SUV 6.8 on the prior exam. Porta hepatis node measures 1.0 cm and SUV 5.4 on image 108/4 compared to 1.2 cm and SUV 5.1 on the prior exam. The abdominal retroperitoneal hypermetabolic nodes have resolved. Anterior inferior omental hypermetabolic implant measures 2.7 x 1.7 cm and SUV 4.9 on image 153/4 compared to 2.9 x 1.6 cm and SUV 5.3 on the prior exam. Smaller more cephalad abdominal omental implants are similar. Left pelvic/adnexal implant measures 4.8 x 2.6 cm and SUV 5.6 on image 163/4 versus 4.2 x 3.0 cm and SUV 17.1 on the prior exam. Diffuse colonic hypermetabolism, most significant in the low rectum and anus at SUV 14.9, no  CT correlate. Normal adrenal glands. Abdominal aortic atherosclerosis. Hysterectomy. Pelvic floor laxity. BONES AND SOFT TISSUE: No abnormal FDG activity localizes to the bones. No metabolically active aggressive osseous lesion. Thoracolumbar spine fixation. VASCULATURE: Right port-a-cath tip mid right atrium. Aortic and coronary artery calcifications. IMPRESSION: 1. Minimal response to therapy, as evidenced by decreased size and hypermetabolism of Pulmonary Metastasis, gallbladder primary and abdominal nodal metastasis. Omental and left pelvic peritoneal/adnexal implants are relatively similar. 2.  No new sites of disease. 3. Anorectal hypermetabolism is most likely physiologic 4. Incidental findings, including: Aortic atherosclerosis (icd10-i70.0). Coronary artery atherosclerosis. Electronically signed by: Rockey Kilts MD 03/21/2024 01:33 PM EDT RP Workstation: [id number]     CODE STATUS:  Code Status History     Date Active Date Inactive Code Status Order ID Comments User Context   12/14/2023 1601 12/16/2023 2004 Limited: Do not attempt resuscitation (DNR) -DNR-LIMITED -Do Not Intubate/DNI  [id number]  Caleen Burgess BROCKS, MD ED   09/17/2022 1236 09/18/2022 1519 Full Code [id number]  Onetha Kuba, MD Inpatient   02/27/2021 1602 03/09/2021 2325 Full Code [id number]  Onetha Kuba, MD Inpatient   06/27/2020 1110 06/28/2020 0504 Full Code [id number]  Derrill Dawn, MD HOV    Questions for Most Recent Historical Code Status (Order [id number])     Question Answer  If pulseless and not breathing No CPR or chest compressions.   In Pre-Arrest Conditions (Patient Is Breathing and Has A Pulse) Do not intubate. Provide all appropriate non-invasive medical interventions. Avoid ICU transfer unless indicated or required.   Consent: Discussion documented in EHR or advanced directives reviewed                Advance Directive Documentation    Flowsheet Row Most Recent Value  Type of Advance Directive Healthcare Power of  Attorney, Living will  Pre-existing out of facility DNR order (yellow form or pink MOST form) --  MOST Form in Place? --    Orders Placed This Encounter  Procedures   CBC with Differential (Cancer Center Only)    Standing Status:   Future    Expected Date:   05/13/2024    Expiration Date:   05/13/2025   CMP (Cancer Center only)    Standing Status:   Future    Expected Date:   05/13/2024    Expiration Date:   05/13/2025   Magnesium     Standing Status:   Future    Expected Date:   05/13/2024    Expiration Date:   05/13/2025   CBC with Differential (Cancer Center Only)    Standing Status:   Future    Expected Date:   05/20/2024    Expiration Date:   05/20/2025   CMP (Cancer Center only)    Standing Status:   Future    Expected Date:   05/20/2024    Expiration Date:   05/20/2025   Magnesium     Standing Status:   Future    Expected Date:   05/20/2024    Expiration Date:   05/20/2025     Future Appointments  Date Time Provider Department Center  04/08/2024  9:30 AM [facility] INFUSION [facility] None  04/11/2024  1:15 PM Kara Dorn NOVAK, MD [facility] [address]  04/11/2024  2:45 PM [facility] FLUSH [facility] None  04/21/2024  8:30 AM [facility] FLUSH [facility] None  04/21/2024  9:00 AM Jayvon Mounger, MD [facility] None  04/22/2024  9:30 AM [facility] INFUSION [facility] None  04/28/2024  9:15 AM [facility] FLUSH [facility] None  04/28/2024  9:45 AM Denia Mcvicar, MD [facility] None  04/29/2024 10:00 AM [facility] INFUSION [facility] None  05/02/2024  1:30 PM [facility] FLUSH [facility] None     This document was completed utilizing speech recognition software. Grammatical errors, random word insertions, pronoun errors, and incomplete sentences are an occasional consequence of this system due to software limitations, ambient noise, and hardware issues. Any formal questions or concerns about the content, text or information contained within the body of this  dictation should be directly addressed to the provider for clarification.

## 2024-04-07 NOTE — Assessment & Plan Note (Signed)
 Potassium low at 3.3 today.  Will give her a total of 40 mEq of potassium tomorrow

## 2024-04-07 NOTE — Assessment & Plan Note (Signed)
 Experiencing hypomagnesemia secondary to chemotherapy. Currently taking magnesium  supplements twice daily without adverse effects such as diarrhea. - Continue magnesium  supplementation twice daily -She will receive extra magnesium  of 4 g total tomorrow with IV fluids, as she has persistent hypomagnesemia. - Monitor magnesium  levels and adjust supplementation as needed

## 2024-04-07 NOTE — Assessment & Plan Note (Signed)
 Please review oncology history for additional details and timeline of events.  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 Biopsy from the omentum confirmed adenocarcinoma, originating from the gallbladder.  Immunostains support diagnosis of gallbladder carcinoma.    Previously I discussed diagnosis, staging, prognosis, plan of care, treatment options.  Reviewed NCCN guidelines. Given stage IV disease, all treatment options are palliative in nature and not curative in intent.  This was discussed with patient, her daughter and son-in-law who were accompanying.  They verbalized understanding.    Discussed the aggressive nature of stage IV gallbladder adenocarcinoma and overall poor prognosis.  Life expectancy with treatment is 1 to 1.5 years; without treatment, less than six months.   NGS panel testing on the specimen and also liquid biopsy for mutation analysis showed no actionable mutations.  Staging PET scan on 12/29/2023 showed known gallbladder mass extending into the liver.  Peritoneal carcinomatosis noted.  Numerous lung nodules were identified, small bowel with FDG uptake, concerning for metastatic disease in the lungs.  Metastatic lymphadenopathy was noted in the abdomen and retroperitoneum.  This scan will serve as a good baseline for future compression.  Discussed results with the patient and family members previously.   Plan made to proceed with palliative systemic chemoimmunotherapy with gemcitabine , cisplatin  and durvalumab .  She started this from 01/01/2024.  Tolerating chemo well so far.  No major side effects except for cytopenias.  Following completion of 4 cycles of chemotherapy, restaging PET scan on 03/21/2024 showed overall improvement with improvement in the lesions in the lungs, lymph nodes, and omentum.   Plan is to continue current management for up to 8 cycles.  If cytopenias or other side effects are prohibitive, we will plan for at least 6  cycles.  She is due for cycle 5-day 8 of chemotherapy tomorrow.  Labs reveal leukopenia with white count of 2500 but ANC is 1300.  Platelet count of 102,000.  Hemoglobin 8.9.  Potassium 3.3, magnesium  1.4.  Overall no dose-limiting toxicities.  We will proceed with cycle 5-day 8 tomorrow.  She will receive extra potassium and extra magnesium  with fluids tomorrow and G-CSF support on 04/11/2024.  Previously we sent a referral to genetic counselor for genetic testing.  RTC in 2 weeks for labs, office visit and continuation of chemotherapy.

## 2024-04-07 NOTE — Progress Notes (Signed)
 Additional Mg 2g order added to hydration for tx on 9/26 per Dr. Autumn. Dr. Autumn will send an outpatient rx for K for pt to replenish at home.   Jontrell Bushong, PharmD, MBA

## 2024-04-07 NOTE — Assessment & Plan Note (Signed)
 ANC permissible to proceed with chemotherapy tomorrow.  She will receive G-CSF support on 04/11/2024 as scheduled.  In the future, we will not skip G-CSF, regardless of her white count, as she has been having leukopenia/neutropenia whenever we skip G-CSF.

## 2024-04-08 ENCOUNTER — Inpatient Hospital Stay

## 2024-04-08 VITALS — BP 144/80 | HR 66 | Temp 98.3°F | Resp 18

## 2024-04-08 DIAGNOSIS — C23 Malignant neoplasm of gallbladder: Secondary | ICD-10-CM

## 2024-04-08 DIAGNOSIS — Z5112 Encounter for antineoplastic immunotherapy: Secondary | ICD-10-CM | POA: Diagnosis not present

## 2024-04-08 MED ORDER — SODIUM CHLORIDE 0.9 % IV SOLN
1000.0000 mg/m2 | Freq: Once | INTRAVENOUS | Status: AC
  Administered 2024-04-08: 1786 mg via INTRAVENOUS
  Filled 2024-04-08: qty 46.97

## 2024-04-08 MED ORDER — SODIUM CHLORIDE 0.9 % IV SOLN
25.0000 mg/m2 | Freq: Once | INTRAVENOUS | Status: AC
  Administered 2024-04-08: 50 mg via INTRAVENOUS
  Filled 2024-04-08: qty 50

## 2024-04-08 MED ORDER — APREPITANT 130 MG/18ML IV EMUL
130.0000 mg | Freq: Once | INTRAVENOUS | Status: AC
  Administered 2024-04-08: 130 mg via INTRAVENOUS
  Filled 2024-04-08: qty 18

## 2024-04-08 MED ORDER — DEXAMETHASONE SODIUM PHOSPHATE 10 MG/ML IJ SOLN
10.0000 mg | Freq: Once | INTRAMUSCULAR | Status: AC
  Administered 2024-04-08: 10 mg via INTRAVENOUS
  Filled 2024-04-08: qty 1

## 2024-04-08 MED ORDER — SODIUM CHLORIDE 0.9 % IV SOLN: INTRAVENOUS | Status: DC

## 2024-04-08 MED ORDER — PALONOSETRON HCL INJECTION 0.25 MG/5ML
0.2500 mg | Freq: Once | INTRAVENOUS | Status: AC
  Administered 2024-04-08: 0.25 mg via INTRAVENOUS
  Filled 2024-04-08: qty 5

## 2024-04-08 MED ORDER — MAGNESIUM SULFATE 2 GM/50ML IV SOLN
2.0000 g | Freq: Once | INTRAVENOUS | Status: AC
  Administered 2024-04-08: 2 g via INTRAVENOUS
  Filled 2024-04-08: qty 50

## 2024-04-08 MED ORDER — POTASSIUM CHLORIDE IN NACL 20-0.9 MEQ/L-% IV SOLN
Freq: Once | INTRAVENOUS | Status: AC
  Filled 2024-04-08: qty 1000

## 2024-04-08 NOTE — Patient Instructions (Signed)
 [facility] - A DEPT OF [facility]  Discharge Instructions: Thank you for choosing [facility] to provide your oncology and hematology care.   If you have a lab appointment with the Cancer Center, please go directly to the Cancer Center and check in at the registration area.   Wear comfortable clothing and clothing appropriate for easy access to any Portacath or PICC line.   We strive to give you quality time with your provider. You may need to reschedule your appointment if you arrive late (15 or more minutes).  Arriving late affects you and other patients whose appointments are after yours.  Also, if you miss three or more appointments without notifying the office, you may be dismissed from the clinic at the provider's discretion.      For prescription refill requests, have your pharmacy contact our office and allow 72 hours for refills to be completed.    Today you received the following chemotherapy and/or immunotherapy agents gemzar, cisplatin      To help prevent nausea and vomiting after your treatment, we encourage you to take your nausea medication as directed.  BELOW ARE SYMPTOMS THAT SHOULD BE REPORTED IMMEDIATELY: *FEVER GREATER THAN 100.4 F (38 C) OR HIGHER *CHILLS OR SWEATING *NAUSEA AND VOMITING THAT IS NOT CONTROLLED WITH YOUR NAUSEA MEDICATION *UNUSUAL SHORTNESS OF BREATH *UNUSUAL BRUISING OR BLEEDING *URINARY PROBLEMS (pain or burning when urinating, or frequent urination) *BOWEL PROBLEMS (unusual diarrhea, constipation, pain near the anus) TENDERNESS IN MOUTH AND THROAT WITH OR WITHOUT PRESENCE OF ULCERS (sore throat, sores in mouth, or a toothache) UNUSUAL RASH, SWELLING OR PAIN  UNUSUAL VAGINAL DISCHARGE OR ITCHING   Items with * indicate a potential emergency and should be followed up as soon as possible or go to the Emergency Department if any problems should occur.  Please show the CHEMOTHERAPY ALERT CARD or  IMMUNOTHERAPY ALERT CARD at check-in to the Emergency Department and triage nurse.  Should you have questions after your visit or need to cancel or reschedule your appointment, please contact [facility] - [facility] DE[facility]  Dept: [phone]  and follow the prompts.  Office hours are 8:00 a.m. to 4:30 p.m. Monday - Friday. Please note that voicemails left after 4:00 p.m. may not be returned until the following business day.  We are closed weekends and major holidays. You have access to a nurse at all times for urgent questions. Please call the main number to the clinic Dept: [phone] and follow the prompts.   For any non-urgent questions, you may also contact your provider using MyChart. We now offer e-Visits for anyone 51 and older to request care online for non-urgent symptoms. For details visit [email or web address].   Also download the MyChart app! Go to the app store, search "MyChart", open the app, select [facility], and log in with your MyChart username and password.

## 2024-04-11 ENCOUNTER — Other Ambulatory Visit (HOSPITAL_COMMUNITY): Payer: Self-pay

## 2024-04-11 ENCOUNTER — Inpatient Hospital Stay

## 2024-04-11 ENCOUNTER — Encounter: Payer: Self-pay | Admitting: Pulmonary Disease

## 2024-04-11 ENCOUNTER — Ambulatory Visit (INDEPENDENT_AMBULATORY_CARE_PROVIDER_SITE_OTHER): Admitting: Pulmonary Disease

## 2024-04-11 VITALS — HR 70 | Ht 63.0 in | Wt 161.0 lb

## 2024-04-11 VITALS — BP 177/70 | HR 70 | Temp 98.9°F | Resp 16

## 2024-04-11 DIAGNOSIS — G4733 Obstructive sleep apnea (adult) (pediatric): Secondary | ICD-10-CM | POA: Diagnosis not present

## 2024-04-11 DIAGNOSIS — J449 Chronic obstructive pulmonary disease, unspecified: Secondary | ICD-10-CM

## 2024-04-11 DIAGNOSIS — Z5112 Encounter for antineoplastic immunotherapy: Secondary | ICD-10-CM | POA: Diagnosis not present

## 2024-04-11 DIAGNOSIS — C23 Malignant neoplasm of gallbladder: Secondary | ICD-10-CM

## 2024-04-11 DIAGNOSIS — Z87891 Personal history of nicotine dependence: Secondary | ICD-10-CM

## 2024-04-11 MED ORDER — BREZTRI AEROSPHERE 160-9-4.8 MCG/ACT IN AERO
2.0000 | INHALATION_SPRAY | Freq: Two times a day (BID) | RESPIRATORY_TRACT | 11 refills | Status: DC
  Filled 2024-04-11: qty 10.7, 30d supply, fill #0

## 2024-04-11 MED ORDER — ALBUTEROL SULFATE HFA 108 (90 BASE) MCG/ACT IN AERS
1.0000 | INHALATION_SPRAY | Freq: Four times a day (QID) | RESPIRATORY_TRACT | 6 refills | Status: DC | PRN
  Filled 2024-04-11: qty 13.4, 50d supply, fill #0

## 2024-04-11 MED ORDER — PEGFILGRASTIM-CBQV 6 MG/0.6ML ~~LOC~~ SOSY
6.0000 mg | PREFILLED_SYRINGE | Freq: Once | SUBCUTANEOUS | Status: AC
  Administered 2024-04-11: 6 mg via SUBCUTANEOUS
  Filled 2024-04-11: qty 0.6

## 2024-04-11 NOTE — Patient Instructions (Addendum)
 Star breztri  inhaler 2 puffs twice daily - rinse mouth out after each use  Stop using yupelri , formoterol  and budesonide  nebs  Use albuterol  inhaler 1-2 puffs every 4-6 hours as needed  Follow up in 1 year

## 2024-04-11 NOTE — Progress Notes (Unsigned)
 Synopsis: Referred in December 2022 for shortness of breath  Subjective:   PATIENT ID: Janice Pace Blush GENDER: female DOB: Jun 23, 1946, MRN: [id number]  HPI  Chief Complaint  Patient presents with   Medical Management of Chronic Issues   Donisha Hoch is a 78 year old woman, former smoker with GERD chronic back pain s/p anterior lateral interbody fusion of L2-3 and 3-4 with posterior spinal fixation in 2022 who returns to pulmonary clinic for COPD and OSA.   She is undergoing chemotherapy, which began in June and will continue through March for adenocarcinoma of the gallbladder.  Her blood pressure fluctuates, with home readings between 140 to 160 mmHg and an office reading of 180/75 mmHg. She takes metoprolol  50 mg and spironolactone  25 mg daily. There is no noted correlation between her blood pressure and Decadron  use. She experiences leg swelling, managed with support hose, and feels cold consistently. No fever, chills, or sweats. Constipation is present without diarrhea.  She remains on nebulizer treatments but would like to go back to an inhaler.   OV 05/20/23 She has been using a CPAP machine for at least four hours every night. However, she reports discomfort with the machine and often wakes up to remove it. She has not noticed any significant improvement in energy levels or reduction in daytime fatigue since starting the CPAP therapy. She also reports general sleep issues, often waking up in the middle of the night and staying awake for several hours.  In addition to the CPAP therapy, she has been working on weight loss and has lost eleven pounds since the sleep study was conducted. She is on medication to assist with weight loss and reports feeling a little better with her breathing since losing weight. She is also on Pulmicort , Perforomist , and Yupelri  for her breathing issues and reports no need for albuterol .  OV 02/04/23 She complains of increase in exertional dyspnea, sweating  and fatigue. For example after getting bathed and dressed it takes her a few minutes to recover. She denies chest pain, jaw pain or arm pain. She is using performist and budesonide  nebs twice daily with as needed albuterol . She does snore at night and will wake her self up due to snoring. She reports labile BP readings.   OV 07/30/22 She was seen by Izetta Rouleau, NP 05/06/22 and changed to breztri  inhaler therapy at that time. She was on breo previously. She stopped breztri  because she felt it gave her pneumonia. She had lung cancer screening CT Chest done with 5.6mm RUL nodule that is new and a 6 month follow up scan ordered.   She is waiting on performist and budesonide  nebs this week. She just received her nebulizer machine.   She has been stable since her visit with Izetta Rouleau, NP.   OV 02/03/22 She had reduced and increased RV on PFTs indicating air trapping.   She has been using breo about 3 days per week when her husband reminds her. She does not notice much change in her breathing when using it. She denies issues with cough or wheezing. She denies night time awakenings. She continues to have exertional dyspnea which is no new.   She is asking if a nebulizer regimen would be better than using the breo inhaler.   OV 08/07/21 She reports her breathing is much better after starting Breo Ellipta  100-25 MCG 1 puff daily.  She is not noticing the cough and wheezing like before.  She reports her shortness of breath  has improved as well.  She is accompanied by her husband today.  They are planning a motorcycle trip out west in [place] on their [employer] motorcycle.  OV 06/20/21 She reports having shortness of breath since her back surgery in August.  The dyspnea is worse with exertion.  She has intermittent cough and wheezing.  She had intermittent wheezing prior to the surgery.  Otherwise she denies significant dyspnea before her hospitalization as she was doing water aerobics on a regular basis.  She  has resumed water aerobics but is going a couple days of the week and is noticing that her stamina is improving.  She has been evaluated by Dr. JINNY Bergamo of cardiology with normal exercise stress test and no ischemic changes but reduced exercise capacity for her age.  Echocardiogram 04/16/2021 showed moderate concentric LVH with LVEF 55 to 60%.  Grade 1 diastolic dysfunction.  Trace tricuspid regurgitation.  She is a former smoker with 50+ pack year smoking history.  She smoked from age [age].  Past Medical History:  Diagnosis Date   Arthritis    Asthma    Bronchitis    Cancer (HCC)    dermatosivros arcoma and protuverans   Carpal tunnel syndrome, left upper limb 11/06/2017   Chronic back pain    degenerative disc disease   Constipation    stool softener daily   Constipation    COPD (chronic obstructive pulmonary disease) (HCC)    GERD (gastroesophageal reflux disease)    takes Prilosec daily   GERD (gastroesophageal reflux disease)    Heart murmur    Heart valve disorder    Hemorrhoids    History of hiatal hernia    Ileus (HCC) 03/02/2021   Insomnia    d/t chantix    Joint pain    Nocturia    Ovarian failure 05/28/2022   Personal history of colonic adenoma 03/13/2003   03/13/2003 - 5 mm adenoma   Pre-diabetes    Prediabetes    White coat syndrome without diagnosis of hypertension      Family History  Problem Relation Age of Onset   Leukemia Mother    Anesthesia problems Neg Hx    Hypotension Neg Hx    Malignant hyperthermia Neg Hx    Pseudochol deficiency Neg Hx    Colon cancer Neg Hx    Colon polyps Neg Hx    Esophageal cancer Neg Hx    Stomach cancer Neg Hx    Rectal cancer Neg Hx      Social History   Socioeconomic History   Marital status: Married    Spouse name: david   Number of children: 3   Years of education: Not on file   Highest education level: GED or equivalent  Occupational History   Occupation: retired  Tobacco Use   Smoking status: Former     Current packs/day: 0.00    Average packs/day: 1 pack/day for 50.0 years (50.0 ttl pk-yrs)    Types: Cigarettes    Start date: 08/16/1961    Quit date: 08/17/2011    Years since quitting: 12.6    Passive exposure: Never   Smokeless tobacco: Never  Vaping Use   Vaping status: Never Used  Substance and Sexual Activity   Alcohol use: No    Comment: occasional wine   Drug use: No   Sexual activity: Yes    Birth control/protection: Surgical, Other-see comments  Other Topics Concern   Not on file  Social History Narrative   ** Merged History  Encounter **       Social Drivers of Health   Financial Resource Strain: Low Risk  (08/25/2023)   Overall Financial Resource Strain (CARDIA)    Difficulty of Paying Living Expenses: Not hard at all  Food Insecurity: No Food Insecurity (12/24/2023)   Hunger Vital Sign    Worried About Running Out of Food in the Last Year: Never true    Ran Out of Food in the Last Year: Never true  Transportation Needs: No Transportation Needs (12/24/2023)   PRAPARE - Administrator, Civil Service (Medical): No    Lack of Transportation (Non-Medical): No  Physical Activity: Inactive (08/25/2023)   Exercise Vital Sign    Days of Exercise per Week: 0 days    Minutes of Exercise per Session: 0 min  Stress: No Stress Concern Present (08/25/2023)   [employer] of Occupational Health - Occupational Stress Questionnaire    Feeling of Stress : Not at all  Social Connections: Socially Integrated (12/14/2023)   Social Connection and Isolation Panel    Frequency of Communication with Friends and Family: More than three times a week    Frequency of Social Gatherings with Friends and Family: Once a week    Attends Religious Services: More than 4 times per year    Active Member of [employer] or Organizations: Yes    Attends Engineer, structural: More than 4 times per year    Marital Status: Married  Catering manager Violence: Not At Risk (12/24/2023)    Humiliation, Afraid, Rape, and Kick questionnaire    Fear of Current or Ex-Partner: No    Emotionally Abused: No    Physically Abused: No    Sexually Abused: No     Allergies  Allergen Reactions   Sulfa Antibiotics Hives and Itching   Forteo  [Parathyroid Hormone (Recomb)] Other (See Comments)    Too many side effects   Atorvastatin Other (See Comments)    Muscle aches   Metformin Hcl Other (See Comments)    Joint pains and muscle aches     Outpatient Medications Prior to Visit  Medication Sig Dispense Refill   albuterol  (VENTOLIN  HFA) 108 (90 Base) MCG/ACT inhaler Inhale 1-2 puffs into the lungs every 6 (six) hours as needed for wheezing or shortness of breath. 8 g 1   aspirin  81 MG EC tablet Take 81 mg by mouth daily.     budesonide  (PULMICORT ) 0.5 MG/2ML nebulizer solution Generic for Pulmicort . Inhale one vial in nebulizer twice a day. Rinse mouth after use. 60 mL 11   dexamethasone  (DECADRON ) 4 MG tablet Take 2 tablets (8 mg) by mouth daily x 3 days starting the day after cisplatin  chemotherapy. Take with food. 30 tablet 1   docusate sodium  (COLACE) 100 MG capsule Take 500 mg by mouth at bedtime.     formoterol  (PERFOROMIST ) 20 MCG/2ML nebulizer solution Substituted for: Perforomist  Solution Inhale one vial in nebulizer twice a day. 60 mL 11   lidocaine -prilocaine  (EMLA ) cream Apply to affected area once 30 g 3   magnesium  oxide (MAG-OX) 400 (240 Mg) MG tablet Take 1 tablet (400 mg total) by mouth 2 (two) times daily. 60 tablet 1   megestrol  (MEGACE  ES) 625 MG/5ML suspension Take 5 mLs (625 mg total) by mouth daily. 150 mL 3   metoprolol  succinate (TOPROL -XL) 50 MG 24 hr tablet TAKE 1 TABLET BY MOUTH ONCE DAILY. TAKE WITH OR IMMEDIATELY FOLLOWING A MEAL 90 tablet 1   morphine  (MS CONTIN ) 15 MG  12 hr tablet Take 1 tablet (15 mg total) by mouth every 12 (twelve) hours. 60 tablet 0   omeprazole  (PRILOSEC) 20 MG capsule Take 1 capsule (20 mg total) by mouth 2 (two) times daily before  a meal. 60 capsule 3   ondansetron  (ZOFRAN ) 4 MG tablet Take 1 tablet (4 mg total) by mouth every 6 (six) hours as needed for nausea. 30 tablet 0   ondansetron  (ZOFRAN ) 8 MG tablet Take 1 tablet (8 mg total) by mouth every 8 (eight) hours as needed for nausea or vomiting. Start on the third day after cisplatin . 30 tablet 1   oxyCODONE  10 MG TABS Take 1-1.5 tablets (10-15 mg total) by mouth every 4 (four) hours as needed for moderate pain (pain score 4-6) or severe pain (pain score 7-10). (Patient taking differently: Take 10-15 mg by mouth every 4 (four) hours as needed for moderate pain (pain score 4-6) or severe pain (pain score 7-10). Pt reports she takes this once in awhile) 60 tablet 0   polyethylene glycol (MIRALAX  / GLYCOLAX ) 17 g packet Take 17 g by mouth 2 (two) times daily.     prochlorperazine  (COMPAZINE ) 10 MG tablet Take 1 tablet (10 mg total) by mouth every 6 (six) hours as needed (Nausea or vomiting). 30 tablet 1   revefenacin  (YUPELRI ) 175 MCG/3ML nebulizer solution Inhale one vial in nebulizer once daily. Do not mix with other nebulized medications. 30 mL 11   rosuvastatin  (CRESTOR ) 10 MG tablet Take 1 tablet by mouth once daily 90 tablet 0   spironolactone  (ALDACTONE ) 25 MG tablet Take 25 mg by mouth daily.     methylPREDNISolone  (MEDROL  DOSEPAK) 4 MG TBPK tablet Take as directed on package instructions (Patient not taking: Reported on 04/11/2024) 21 tablet 1   No facility-administered medications prior to visit.   Review of Systems  Constitutional:  Negative for chills, fever, malaise/fatigue and weight loss.  HENT:  Negative for congestion, sinus pain and sore throat.   Eyes: Negative.   Respiratory:  Negative for cough, hemoptysis, sputum production, shortness of breath and wheezing.   Cardiovascular:  Negative for chest pain, palpitations, orthopnea, claudication and leg swelling.  Gastrointestinal:  Negative for abdominal pain, heartburn, nausea and vomiting.  Genitourinary:  Negative.   Musculoskeletal:  Negative for joint pain and myalgias.  Skin:  Negative for rash.  Neurological:  Negative for weakness.  Endo/Heme/Allergies: Negative.   Psychiatric/Behavioral: Negative.     Objective:   Vitals:   04/11/24 1318  Pulse: 70  SpO2: 98%  Weight: 161 lb (73 kg)  Height: 5' 3 (1.6 m)   Physical Exam Constitutional:      General: She is not in acute distress.    Appearance: She is obese. She is not ill-appearing.  HENT:     Head: Normocephalic and atraumatic.  Eyes:     General: No scleral icterus.    Conjunctiva/sclera: Conjunctivae normal.  Cardiovascular:     Rate and Rhythm: Normal rate and regular rhythm.     Pulses: Normal pulses.     Heart sounds: Normal heart sounds. No murmur heard. Pulmonary:     Effort: Pulmonary effort is normal.     Breath sounds: Decreased air movement present. No decreased breath sounds, wheezing, rhonchi or rales.  Musculoskeletal:     Right lower leg: No edema.     Left lower leg: No edema.  Skin:    General: Skin is warm and dry.  Neurological:     General: No focal  deficit present.     Mental Status: She is alert.    CBC    Component Value Date/Time   WBC 2.5 (L) 04/07/2024 0808   WBC 19.9 (H) 03/05/2024 1116   RBC 2.80 (L) 04/07/2024 0808   HGB 8.9 (L) 04/07/2024 0808   HGB 15.2 12/22/2023 0938   HCT 25.6 (L) 04/07/2024 0808   HCT 45.8 12/22/2023 0938   PLT 102 (L) 04/07/2024 0808   PLT 292 12/22/2023 0938   MCV 91.4 04/07/2024 0808   MCV 89 12/22/2023 0938   MCH 31.8 04/07/2024 0808   MCHC 34.8 04/07/2024 0808   RDW 16.2 (H) 04/07/2024 0808   RDW 13.6 12/22/2023 0938   LYMPHSABS 1.0 04/07/2024 0808   LYMPHSABS 1.1 12/22/2023 0938   MONOABS 0.2 04/07/2024 0808   EOSABS 0.0 04/07/2024 0808   EOSABS 0.0 12/22/2023 0938   BASOSABS 0.0 04/07/2024 0808   BASOSABS 0.0 12/22/2023 0938      Latest Ref Rng & Units 04/07/2024    8:08 AM 03/31/2024   11:08 AM 03/24/2024   10:37 AM  BMP  Glucose  70 - 99 mg/dL 887  893  898   BUN 8 - 23 mg/dL 16  10  8    Creatinine 0.44 - 1.00 mg/dL 9.07  9.17  9.11   Sodium 135 - 145 mmol/L 140  140  142   Potassium 3.5 - 5.1 mmol/L 3.3  3.9  3.8   Chloride 98 - 111 mmol/L 107  109  111   CO2 22 - 32 mmol/L 26  27  26    Calcium  8.9 - 10.3 mg/dL 8.5  8.5  8.3    Chest imaging: LCS CT Chest 12/23/22 1. Lung-RADS 2S, benign appearance or behavior. Continue annual screening with low-dose chest CT without contrast in 12 months. 2. The S modifier above refers to potentially clinically significant non lung cancer related findings. Specifically, there is aortic atherosclerosis, in addition to left main and three-vessel coronary artery disease. Please note that although the presence of coronary artery calcium  documents the presence of coronary artery disease, the severity of this disease and any potential stenosis cannot be assessed on this non-gated CT examination. Assessment for potential risk factor modification, dietary therapy or pharmacologic therapy may be warranted, if clinically indicated. 3. Mild diffuse bronchial wall thickening with very mild centrilobular and paraseptal emphysema; imaging findings suggestive of underlying COPD. 4. There are calcifications of the aortic valve. Echocardiographic correlation for evaluation of potential valvular dysfunction may be warranted if clinically indicated.  LCS CT Chest Scan 06/18/22 1. Lung-RADS 3, probably benign findings. Short-term follow-up in 6 months is recommended with repeat low-dose chest CT without contrast (please use the following order, CT CHEST [facility] NODULE FOLLOW-UP W/O CM). New indistinct posterior right upper lobe nodule measuring 5.6 mm in volume derived mean diameter. 2. Three-vessel coronary atherosclerosis. 3. Mild diffuse hepatic steatosis. 4. Stable bilateral adrenal adenomas, for which no follow-up imaging is recommended. 5. Aortic Atherosclerosis  CTA Chest  05/17/21 1. Negative examination for pulmonary embolism. 2. Diffuse bilateral bronchial wall thickening and mild mosaic attenuation of the airspaces throughout. Findings are consistent with nonspecific infectious or inflammatory bronchitis and small airways disease. 3. Minimal paraseptal emphysema. 4. Background of very fine centrilobular pulmonary nodules, most concentrated at the lung apices, most commonly seen in smoking-related respiratory bronchiolitis. 5. Coronary artery disease.  PFT:    Latest Ref Rng & Units 08/28/2021   10:46 AM  PFT Results  FVC-Pre L 2.21  FVC-Predicted Pre % 84   FVC-Post L 2.32   FVC-Predicted Post % 88   Pre FEV1/FVC % % 80   Post FEV1/FCV % % 80   FEV1-Pre L 1.78   FEV1-Predicted Pre % 90   FEV1-Post L 1.86   DLCO uncorrected ml/min/mmHg 20.18   DLCO UNC% % 112   DLCO corrected ml/min/mmHg 20.18   DLCO COR %Predicted % 112   DLVA Predicted % 109   TLC L 8.89   TLC % Predicted % 186   RV % Predicted % 294   PFT 2023: air trapping present  Labs:  Path:  Echo 04/16/21: Left ventricle cavity is normal in size. Moderate concentric hypertrophy  of the left ventricle. Normal global wall motion. Normal LV systolic  function with visual EF 55-60%. Doppler evidence of grade I (impaired)  diastolic dysfunction, normal LAP.  Trace TR.  No evidence of pulmonary hypertension.  Heart Catheterization:  Assessment & Plan:   OSA (obstructive sleep apnea)  Chronic obstructive pulmonary disease, unspecified COPD type (HCC)  Discussion: Summit Borchardt is a 16y year old woman, former smoker with GERD chronic back pain s/p anterior lateral interbody fusion of L2-3 and 3 4 with posterior spinal fixation in 2022 who returns to pulmonary clinic for COPD and OSA.   Chronic Obstructive Pulmonary Disease (COPD) - start breztri  inhaler 2 puffs twice daily - stop yupelri , formoterol  and budesonide  nebs - use albuterol  inhaler 1-2 puffs every 4-6 hours as  needed  Lung Cancer Screening - under going surveillance scans with heme/onc  Follow up in 1 year  Dorn Chill, MD [facility] Office: [phone]   Current Outpatient Medications:    albuterol  (VENTOLIN  HFA) 108 (90 Base) MCG/ACT inhaler, Inhale 1-2 puffs into the lungs every 6 (six) hours as needed for wheezing or shortness of breath., Disp: 8 g, Rfl: 1   aspirin  81 MG EC tablet, Take 81 mg by mouth daily., Disp: , Rfl:    budesonide  (PULMICORT ) 0.5 MG/2ML nebulizer solution, Generic for Pulmicort . Inhale one vial in nebulizer twice a day. Rinse mouth after use., Disp: 60 mL, Rfl: 11   dexamethasone  (DECADRON ) 4 MG tablet, Take 2 tablets (8 mg) by mouth daily x 3 days starting the day after cisplatin  chemotherapy. Take with food., Disp: 30 tablet, Rfl: 1   docusate sodium  (COLACE) 100 MG capsule, Take 500 mg by mouth at bedtime., Disp: , Rfl:    formoterol  (PERFOROMIST ) 20 MCG/2ML nebulizer solution, Substituted for: Perforomist  Solution Inhale one vial in nebulizer twice a day., Disp: 60 mL, Rfl: 11   lidocaine -prilocaine  (EMLA ) cream, Apply to affected area once, Disp: 30 g, Rfl: 3   magnesium  oxide (MAG-OX) 400 (240 Mg) MG tablet, Take 1 tablet (400 mg total) by mouth 2 (two) times daily., Disp: 60 tablet, Rfl: 1   megestrol  (MEGACE  ES) 625 MG/5ML suspension, Take 5 mLs (625 mg total) by mouth daily., Disp: 150 mL, Rfl: 3   metoprolol  succinate (TOPROL -XL) 50 MG 24 hr tablet, TAKE 1 TABLET BY MOUTH ONCE DAILY. TAKE WITH OR IMMEDIATELY FOLLOWING A MEAL, Disp: 90 tablet, Rfl: 1   morphine  (MS CONTIN ) 15 MG 12 hr tablet, Take 1 tablet (15 mg total) by mouth every 12 (twelve) hours., Disp: 60 tablet, Rfl: 0   omeprazole  (PRILOSEC) 20 MG capsule, Take 1 capsule (20 mg total) by mouth 2 (two) times daily before a meal., Disp: 60 capsule, Rfl: 3   ondansetron  (ZOFRAN ) 4 MG tablet, Take 1 tablet (  4 mg total) by mouth every 6 (six) hours as needed for nausea., Disp: 30  tablet, Rfl: 0   ondansetron  (ZOFRAN ) 8 MG tablet, Take 1 tablet (8 mg total) by mouth every 8 (eight) hours as needed for nausea or vomiting. Start on the third day after cisplatin ., Disp: 30 tablet, Rfl: 1   oxyCODONE  10 MG TABS, Take 1-1.5 tablets (10-15 mg total) by mouth every 4 (four) hours as needed for moderate pain (pain score 4-6) or severe pain (pain score 7-10). (Patient taking differently: Take 10-15 mg by mouth every 4 (four) hours as needed for moderate pain (pain score 4-6) or severe pain (pain score 7-10). Pt reports she takes this once in awhile), Disp: 60 tablet, Rfl: 0   polyethylene glycol (MIRALAX  / GLYCOLAX ) 17 g packet, Take 17 g by mouth 2 (two) times daily., Disp: , Rfl:    prochlorperazine  (COMPAZINE ) 10 MG tablet, Take 1 tablet (10 mg total) by mouth every 6 (six) hours as needed (Nausea or vomiting)., Disp: 30 tablet, Rfl: 1   revefenacin  (YUPELRI ) 175 MCG/3ML nebulizer solution, Inhale one vial in nebulizer once daily. Do not mix with other nebulized medications., Disp: 30 mL, Rfl: 11   rosuvastatin  (CRESTOR ) 10 MG tablet, Take 1 tablet by mouth once daily, Disp: 90 tablet, Rfl: 0   spironolactone  (ALDACTONE ) 25 MG tablet, Take 25 mg by mouth daily., Disp: , Rfl:    methylPREDNISolone  (MEDROL  DOSEPAK) 4 MG TBPK tablet, Take as directed on package instructions (Patient not taking: Reported on 04/11/2024), Disp: 21 tablet, Rfl: 1

## 2024-04-12 ENCOUNTER — Other Ambulatory Visit (HOSPITAL_COMMUNITY): Payer: Self-pay

## 2024-04-12 ENCOUNTER — Other Ambulatory Visit: Payer: Self-pay

## 2024-04-14 ENCOUNTER — Inpatient Hospital Stay

## 2024-04-14 ENCOUNTER — Other Ambulatory Visit: Payer: Self-pay

## 2024-04-14 ENCOUNTER — Inpatient Hospital Stay: Admitting: Oncology

## 2024-04-15 ENCOUNTER — Inpatient Hospital Stay

## 2024-04-15 ENCOUNTER — Encounter: Payer: Self-pay | Admitting: Pulmonary Disease

## 2024-04-21 ENCOUNTER — Other Ambulatory Visit: Payer: Self-pay

## 2024-04-21 ENCOUNTER — Inpatient Hospital Stay

## 2024-04-21 ENCOUNTER — Inpatient Hospital Stay: Attending: Oncology | Admitting: Oncology

## 2024-04-21 ENCOUNTER — Encounter: Payer: Self-pay | Admitting: Oncology

## 2024-04-21 ENCOUNTER — Other Ambulatory Visit (HOSPITAL_COMMUNITY): Payer: Self-pay

## 2024-04-21 VITALS — BP 157/59 | HR 75 | Temp 97.5°F | Resp 19 | Wt 163.3 lb

## 2024-04-21 DIAGNOSIS — R1904 Left lower quadrant abdominal swelling, mass and lump: Secondary | ICD-10-CM | POA: Diagnosis not present

## 2024-04-21 DIAGNOSIS — Z5112 Encounter for antineoplastic immunotherapy: Secondary | ICD-10-CM | POA: Diagnosis present

## 2024-04-21 DIAGNOSIS — Z66 Do not resuscitate: Secondary | ICD-10-CM | POA: Diagnosis not present

## 2024-04-21 DIAGNOSIS — R197 Diarrhea, unspecified: Secondary | ICD-10-CM | POA: Insufficient documentation

## 2024-04-21 DIAGNOSIS — I10 Essential (primary) hypertension: Secondary | ICD-10-CM | POA: Insufficient documentation

## 2024-04-21 DIAGNOSIS — Z882 Allergy status to sulfonamides status: Secondary | ICD-10-CM | POA: Insufficient documentation

## 2024-04-21 DIAGNOSIS — C23 Malignant neoplasm of gallbladder: Secondary | ICD-10-CM | POA: Insufficient documentation

## 2024-04-21 DIAGNOSIS — Z7982 Long term (current) use of aspirin: Secondary | ICD-10-CM | POA: Insufficient documentation

## 2024-04-21 DIAGNOSIS — K828 Other specified diseases of gallbladder: Secondary | ICD-10-CM | POA: Insufficient documentation

## 2024-04-21 DIAGNOSIS — I2510 Atherosclerotic heart disease of native coronary artery without angina pectoris: Secondary | ICD-10-CM | POA: Insufficient documentation

## 2024-04-21 DIAGNOSIS — B379 Candidiasis, unspecified: Secondary | ICD-10-CM | POA: Diagnosis not present

## 2024-04-21 DIAGNOSIS — C7802 Secondary malignant neoplasm of left lung: Secondary | ICD-10-CM | POA: Insufficient documentation

## 2024-04-21 DIAGNOSIS — C772 Secondary and unspecified malignant neoplasm of intra-abdominal lymph nodes: Secondary | ICD-10-CM | POA: Diagnosis not present

## 2024-04-21 DIAGNOSIS — R531 Weakness: Secondary | ICD-10-CM | POA: Diagnosis not present

## 2024-04-21 DIAGNOSIS — D6481 Anemia due to antineoplastic chemotherapy: Secondary | ICD-10-CM | POA: Insufficient documentation

## 2024-04-21 DIAGNOSIS — D72829 Elevated white blood cell count, unspecified: Secondary | ICD-10-CM

## 2024-04-21 DIAGNOSIS — E876 Hypokalemia: Secondary | ICD-10-CM | POA: Insufficient documentation

## 2024-04-21 DIAGNOSIS — Z79899 Other long term (current) drug therapy: Secondary | ICD-10-CM | POA: Insufficient documentation

## 2024-04-21 DIAGNOSIS — Z79630 Long term (current) use of alkylating agent: Secondary | ICD-10-CM | POA: Insufficient documentation

## 2024-04-21 DIAGNOSIS — J4489 Other specified chronic obstructive pulmonary disease: Secondary | ICD-10-CM | POA: Diagnosis not present

## 2024-04-21 DIAGNOSIS — F419 Anxiety disorder, unspecified: Secondary | ICD-10-CM | POA: Insufficient documentation

## 2024-04-21 DIAGNOSIS — B370 Candidal stomatitis: Secondary | ICD-10-CM | POA: Insufficient documentation

## 2024-04-21 DIAGNOSIS — K219 Gastro-esophageal reflux disease without esophagitis: Secondary | ICD-10-CM | POA: Diagnosis not present

## 2024-04-21 DIAGNOSIS — B3789 Other sites of candidiasis: Secondary | ICD-10-CM | POA: Insufficient documentation

## 2024-04-21 DIAGNOSIS — H538 Other visual disturbances: Secondary | ICD-10-CM | POA: Insufficient documentation

## 2024-04-21 DIAGNOSIS — G8929 Other chronic pain: Secondary | ICD-10-CM | POA: Insufficient documentation

## 2024-04-21 DIAGNOSIS — K1230 Oral mucositis (ulcerative), unspecified: Secondary | ICD-10-CM | POA: Insufficient documentation

## 2024-04-21 DIAGNOSIS — C786 Secondary malignant neoplasm of retroperitoneum and peritoneum: Secondary | ICD-10-CM | POA: Diagnosis not present

## 2024-04-21 DIAGNOSIS — Z5111 Encounter for antineoplastic chemotherapy: Secondary | ICD-10-CM | POA: Diagnosis present

## 2024-04-21 DIAGNOSIS — F32A Depression, unspecified: Secondary | ICD-10-CM | POA: Insufficient documentation

## 2024-04-21 DIAGNOSIS — K5900 Constipation, unspecified: Secondary | ICD-10-CM | POA: Insufficient documentation

## 2024-04-21 DIAGNOSIS — T451X5A Adverse effect of antineoplastic and immunosuppressive drugs, initial encounter: Secondary | ICD-10-CM | POA: Diagnosis not present

## 2024-04-21 DIAGNOSIS — C7801 Secondary malignant neoplasm of right lung: Secondary | ICD-10-CM | POA: Diagnosis not present

## 2024-04-21 DIAGNOSIS — M1990 Unspecified osteoarthritis, unspecified site: Secondary | ICD-10-CM | POA: Insufficient documentation

## 2024-04-21 DIAGNOSIS — I700 Atherosclerosis of aorta: Secondary | ICD-10-CM | POA: Diagnosis not present

## 2024-04-21 DIAGNOSIS — L270 Generalized skin eruption due to drugs and medicaments taken internally: Secondary | ICD-10-CM | POA: Insufficient documentation

## 2024-04-21 DIAGNOSIS — D701 Agranulocytosis secondary to cancer chemotherapy: Secondary | ICD-10-CM

## 2024-04-21 DIAGNOSIS — Z79620 Long term (current) use of immunosuppressive biologic: Secondary | ICD-10-CM | POA: Insufficient documentation

## 2024-04-21 DIAGNOSIS — Z79631 Long term (current) use of antimetabolite agent: Secondary | ICD-10-CM | POA: Insufficient documentation

## 2024-04-21 DIAGNOSIS — R112 Nausea with vomiting, unspecified: Secondary | ICD-10-CM | POA: Insufficient documentation

## 2024-04-21 LAB — CMP (CANCER CENTER ONLY)
ALT: 17 U/L (ref 0–44)
AST: 17 U/L (ref 15–41)
Albumin: 3.3 g/dL — ABNORMAL LOW (ref 3.5–5.0)
Alkaline Phosphatase: 140 U/L — ABNORMAL HIGH (ref 38–126)
Anion gap: 7 (ref 5–15)
BUN: 10 mg/dL (ref 8–23)
CO2: 27 mmol/L (ref 22–32)
Calcium: 8.2 mg/dL — ABNORMAL LOW (ref 8.9–10.3)
Chloride: 109 mmol/L (ref 98–111)
Creatinine: 0.82 mg/dL (ref 0.44–1.00)
GFR, Estimated: >60 mL/min (ref >60)
Glucose, Bld: 118 mg/dL — ABNORMAL HIGH (ref 70–99)
Potassium: 3.1 mmol/L — ABNORMAL LOW (ref 3.5–5.1)
Sodium: 143 mmol/L (ref 135–145)
Total Bilirubin: 0.5 mg/dL (ref 0.0–1.2)
Total Protein: 5.6 g/dL — ABNORMAL LOW (ref 6.5–8.1)

## 2024-04-21 LAB — CBC WITH DIFFERENTIAL (CANCER CENTER ONLY)
Abs Immature Granulocytes: 0.72 K/uL — ABNORMAL HIGH (ref 0.00–0.07)
Basophils Absolute: 0.1 K/uL (ref 0.0–0.1)
Basophils Relative: 1 %
Eosinophils Absolute: 0 K/uL (ref 0.0–0.5)
Eosinophils Relative: 0 %
HCT: 21.8 % — ABNORMAL LOW (ref 36.0–46.0)
Hemoglobin: 7.6 g/dL — ABNORMAL LOW (ref 12.0–15.0)
Immature Granulocytes: 5 %
Lymphocytes Relative: 11 %
Lymphs Abs: 1.4 K/uL (ref 0.7–4.0)
MCH: 33.3 pg (ref 26.0–34.0)
MCHC: 34.9 g/dL (ref 30.0–36.0)
MCV: 95.6 fL (ref 80.0–100.0)
Monocytes Absolute: 1.7 K/uL — ABNORMAL HIGH (ref 0.1–1.0)
Monocytes Relative: 13 %
Neutro Abs: 9.3 K/uL — ABNORMAL HIGH (ref 1.7–7.7)
Neutrophils Relative %: 70 %
Platelet Count: 97 K/uL — ABNORMAL LOW (ref 150–400)
RBC: 2.28 MIL/uL — ABNORMAL LOW (ref 3.87–5.11)
RDW: 18.8 % — ABNORMAL HIGH (ref 11.5–15.5)
Smear Review: NORMAL
WBC Count: 13.3 K/uL — ABNORMAL HIGH (ref 4.0–10.5)
nRBC: 0.2 % (ref 0.0–0.2)

## 2024-04-21 LAB — TSH: TSH: 2.6 u[IU]/mL (ref 0.350–4.500)

## 2024-04-21 LAB — PREPARE RBC (CROSSMATCH)

## 2024-04-21 LAB — MAGNESIUM: Magnesium: 1.3 mg/dL — ABNORMAL LOW (ref 1.7–2.4)

## 2024-04-21 MED ORDER — NYSTATIN 100000 UNIT/ML MT SUSP
5.0000 mL | OROMUCOSAL | 1 refills | Status: DC | PRN
  Filled 2024-04-21: qty 250, 9d supply, fill #0
  Filled 2024-04-27 – 2024-04-28 (×2): qty 250, 9d supply, fill #1

## 2024-04-21 NOTE — Assessment & Plan Note (Signed)
 Experiencing hypomagnesemia secondary to chemotherapy. Currently taking magnesium  supplements twice daily without adverse effects such as diarrhea. - Continue magnesium  supplementation twice daily -She will receive extra magnesium  of 4 g total tomorrow with IV fluids, as she has persistent hypomagnesemia. - Monitor magnesium  levels and adjust supplementation as needed

## 2024-04-21 NOTE — Assessment & Plan Note (Addendum)
 Potassium low at 3.1 today.  Will give her a total of 40 mEq of potassium tomorrow

## 2024-04-21 NOTE — Assessment & Plan Note (Addendum)
 Please review oncology history for additional details and timeline of events.  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 Biopsy from the omentum confirmed adenocarcinoma, originating from the gallbladder.  Immunostains support diagnosis of gallbladder carcinoma.    Previously I discussed diagnosis, staging, prognosis, plan of care, treatment options.  Reviewed NCCN guidelines. Given stage IV disease, all treatment options are palliative in nature and not curative in intent.  This was discussed with patient, her daughter and son-in-law who were accompanying.  They verbalized understanding.    Discussed the aggressive nature of stage IV gallbladder adenocarcinoma and overall poor prognosis.  Life expectancy with treatment is 1 to 1.5 years; without treatment, less than six months.   NGS panel testing on the specimen and also liquid biopsy for mutation analysis showed no actionable mutations.  Staging PET scan on 12/29/2023 showed known gallbladder mass extending into the liver.  Peritoneal carcinomatosis noted.  Numerous lung nodules were identified, small bowel with FDG uptake, concerning for metastatic disease in the lungs.  Metastatic lymphadenopathy was noted in the abdomen and retroperitoneum.  This scan will serve as a good baseline for future compression.  Discussed results with the patient and family members previously.   Plan made to proceed with palliative systemic chemoimmunotherapy with gemcitabine , cisplatin  and durvalumab .  She started this from 01/01/2024.  Tolerating chemo well so far.  No major side effects except for cytopenias.  Following completion of 4 cycles of chemotherapy, restaging PET scan on 03/21/2024 showed overall improvement with improvement in the lesions in the lungs, lymph nodes, and omentum.   Plan is to continue current management for up to 8 cycles.  If cytopenias or other side effects are prohibitive, we will plan for at least 6  cycles.  She is due for cycle 6-day 1 of chemotherapy tomorrow.  Labs reveal white count of 13,300, presumably elevated from recent G-CSF.  Hemoglobin low at 7.6.  Platelet count slowly trending down and it is 97,000.  We will proceed with unit of PRBC transfusion to maintain hemoglobin above 8.  Otherwise overall no dose-limiting toxicities.  We will proceed with cycle 6-day 1 tomorrow.  She will receive extra potassium and extra magnesium  with fluids tomorrow.   Previously we sent a referral to genetic counselor for genetic testing.  RTC in 1 week for labs, office visit and continuation of chemotherapy.

## 2024-04-21 NOTE — Progress Notes (Signed)
 Per Dr. Autumn, patient will need additional potassium 20 mEq by mouth and a total of 4 g of mag sulfate. Additional Mg 2g and KCL PO 20mEq added to premeds and hydration for tx on 10/10.   [place] Lapaglia, PharmD, MBA

## 2024-04-21 NOTE — Progress Notes (Signed)
 [facility]  ONCOLOGY CLINIC PROGRESS NOTE   Patient Care Team: [name] as PCP - General (Physician Assistant) Pcp, [name], DO (Family Medicine) [place], [facility] [name], RN as Oncology Nurse Navigator  PATIENT NAME: Janice Pace   MR#: [id number] DOB: 1946-06-26  Date of visit: 04/21/2024   ASSESSMENT & PLAN:   Janice Pace is a 78 y.o. 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   Adenocarcinoma of gallbladder [facility]) Please review oncology history for additional details and timeline of events.  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 Biopsy from the omentum confirmed adenocarcinoma, originating from the gallbladder.  Immunostains support diagnosis of gallbladder carcinoma.    Previously I discussed diagnosis, staging, prognosis, plan of care, treatment options.  Reviewed NCCN guidelines. Given stage IV disease, all treatment options are palliative in nature and not curative in intent.  This was discussed with patient, her daughter and son-in-law who were accompanying.  They verbalized understanding.    Discussed the aggressive nature of stage IV gallbladder adenocarcinoma and overall poor prognosis.  Life expectancy with treatment is 1 to 1.5 years; without treatment, less than six months.   NGS panel testing on the specimen and also liquid biopsy for mutation analysis showed no actionable mutations.  Staging PET scan on 12/29/2023 showed known gallbladder mass extending into the liver.  Peritoneal carcinomatosis noted.  Numerous lung nodules were identified, small bowel with FDG uptake, concerning for metastatic disease in the lungs.   Metastatic lymphadenopathy was noted in the abdomen and retroperitoneum.  This scan will serve as a good baseline for future compression.  Discussed results with the patient and family members previously.   Plan made to proceed with palliative systemic chemoimmunotherapy with gemcitabine , cisplatin  and durvalumab .  She started this from 01/01/2024.  Tolerating chemo well so far.  No major side effects except for cytopenias.  Following completion of 4 cycles of chemotherapy, restaging PET scan on 03/21/2024 showed overall improvement with improvement in the lesions in the lungs, lymph nodes, and omentum.   Plan is to continue current management for up to 8 cycles.  If cytopenias or other side effects are prohibitive, we will plan for at least 6 cycles.  She is due for cycle 6-day 1 of chemotherapy tomorrow.  Labs reveal white count of 13,300, presumably elevated from recent G-CSF.  Hemoglobin low at 7.6.  Platelet count slowly trending down and it is 97,000.  We will proceed with unit of PRBC transfusion to maintain hemoglobin above 8.  Otherwise overall no dose-limiting toxicities.  We will proceed with cycle 6-day 1 tomorrow.  She will receive extra potassium and extra magnesium  with fluids tomorrow.   Previously we sent a referral to genetic counselor for genetic testing.  RTC in 1 week for labs, office visit and continuation of chemotherapy.  Anemia due to antineoplastic chemotherapy Hemoglobin at 7.6 g/dL, requiring intermittent blood transfusions. Transfusion needed before next week's chemotherapy to prevent further hemoglobin drop. - Attempt to arrange blood transfusion today; if not possible, schedule for Saturday. - Ensure transfusion is completed before next week's chemotherapy session.  Hypomagnesemia Experiencing hypomagnesemia secondary to chemotherapy. Currently taking magnesium  supplements twice daily without adverse effects such as diarrhea. - Continue  magnesium  supplementation  twice daily -She will receive extra magnesium  of 4 g total tomorrow with IV fluids, as she has persistent hypomagnesemia. - Monitor magnesium  levels and adjust supplementation as needed  Hypokalemia Potassium low at 3.1 today.  Will give her a total of 40 mEq of potassium tomorrow  Oropharyngeal and esophageal pain likely due to mucositis or suspected thrush Experiencing throat and esophageal pain, particularly when swallowing. No fever, chills, or cough. Pain shifted from upper to lower throat. Differential includes mucositis, thrush, or dexamethasone  irritation. Recent PET scan showed no cancer-related issues. On omeprazole  for acid reflux. - Prescribe Magic Mouthwash with antifungal and numbing agents for swish and swallow. - Hold off on dexamethasone  to see if symptoms improve. - If symptoms persist, refer to a gastroenterologist. - Advise alternating Magic Mouthwash with Pepto Bismol for additional relief.  I reviewed lab results and outside records for this visit and discussed relevant results with the patient. Diagnosis, plan of care and treatment options were also discussed in detail with the patient. Opportunity provided to ask questions and answers provided to her apparent satisfaction. Provided instructions to call our clinic with any problems, questions or concerns prior to return visit. I recommended to continue follow-up with PCP and sub-specialists. She verbalized understanding and agreed with the plan.   NCCN guidelines have been consulted in the planning of this patient's care.  I spent a total of 40 minutes during this encounter with the patient including review of chart and various tests results, discussions about plan of care and coordination of care plan.  Chinita Patten, MD  04/21/2024 12:16 PM  [facility] - A DEPT OF [facility] [address] [place] [address] Dept: [phone] Dept Fax:  [phone]    CHIEF COMPLAINT/ REASON FOR VISIT:   Stage IV gallbladder adenocarcinoma with peritoneal mets and lung mets.  Current Treatment: Palliative systemic chemoimmunotherapy with cisplatin , gemcitabine , durvalumab  started from 01/01/2024.  INTERVAL HISTORY:    Discussed the use of AI scribe software for clinical note transcription with the patient, who gave verbal consent to proceed.  History of Present Illness  Janice Pace is a 78 year old female who presents with a sore throat and difficulty swallowing.  She has been experiencing a sore throat and odynophagia, which persists despite previous attempts at relief. The pain is localized in her throat and lower down, without any associated fever or chills. No symptoms of acid reflux or cough are present. The pain is primarily during swallowing, and she has not experienced any issues with eating. No sensation of food getting stuck in her throat.  She is currently taking omeprazole  twice daily for acid reflux and has been using dexamethasone  for three days following chemotherapy. She questions whether the medication might be contributing to her symptoms.  In addition to her sore throat, she mentions a recent blister that caused some blurry vision, although it has mostly resolved. She is unsure of the cause but notes it might have been an insect bite.  Recent blood work shows a white blood cell count slightly above normal and a decreasing platelet count, currently at 97,000. Her hemoglobin is low at 7.6 g/dL, which is concerning given her ongoing chemotherapy treatment. She has been managing bowel movements with laxatives and reports no issues in that regard.  She is not currently taking any potassium supplements at home, and her potassium and magnesium  levels are low, which are being  addressed during her visits.    I have reviewed the past medical history, past surgical history, social history and family history with the patient  and they are unchanged from previous note.  HISTORY OF PRESENT ILLNESS:   ONCOLOGY HISTORY:   78 y.o. lady with past medical history of essential hypertension, osteoarthritis, COPD/asthma, GERD, depression, anxiety, hiatal hernia, chronic back pain presented to the ED on 12/14/2023 with complaints of worsening epigastric/abdominal pain over 2-3 weeks.  Routine blood work was unremarkable in the ER.  However CT abdomen and pelvis showed irregular appearance of the gallbladder with suggestion of capsular thickening along the fundus of the gallbladder.  Gallbladder appeared inseparable from the adjacent portion of the liver.  Clinical picture was concerning for gallbladder carcinoma.  Mesenteric nodular thickening along the mid lower abdominal cavity indicating peritoneal carcinomatosis.  Soft tissue nodular mass in the left lower quadrant region of the pelvis measuring 3.6 x 2.7 cm, likely ovarian neoplastic lesion.  Multiple lung nodules in the lower lobes bilaterally, largest measuring 12 mm in the right lower lobe, concerning for pulmonary metastatic disease.   She was admitted to the hospital for further evaluation and management.  IR was consulted for peritoneal mass biopsy.  CA 19-9 was significant elevated at 14,368.  CEA was increased at 98.7, CA-125 was also increased at 91.3.   On 12/14/2023, MRI abdomen/MRCP showed thickened, contracted gallbladder with a heterogeneous mass arising from the gallbladder fundus, contiguous with the adjacent liver parenchyma of hepatic segment IVB measuring 3.5 x 2.8 cm. Multiple small rim enhancing lesions within the liver parenchyma. Enlarged, necrotic appearing portacaval and porta hepatis lymph nodes. Small, although rim enhancing and abnormal appearing retroperitoneal lymph nodes. Peritoneal stranding and nodularity, incompletely visualized on this examination although present in the left upper quadrant, consistent with peritoneal metastatic disease. Numerous small  bilateral pulmonary nodules, better assessed by CT. 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   On 12/15/2023, she underwent CT-guided biopsy of the omental lesion.  Pathology showed metastatic well to moderately differentiated adenocarcinoma. The tumor is positive for cytokeratin 7 and shows focal weak positivity for the GI marker CDX2.  The tumor is negative for the GI markers cytokeratin 20.  The tumor is also negative for the GU and GYN marker PAX8.  The tumor is negative for the pulmonary adeno marker TTF-1. This immunohistochemical pattern and histomorphology would be compatible with the clinical suspicion of a gallbladder primary.  The differential diagnosis would also include an upper GI or pancreaticobiliary primary.    Patient presented to our clinic on 12/24/2023 to establish care with us .  Request placed for staging PET scan.  NGS panel testing requested on the specimen and also liquid biopsy obtained.  It showed no actionable mutations.  Staging PET scan on 12/29/2023 showed known gallbladder mass extending into the liver.  Peritoneal carcinomatosis noted.  Numerous lung nodules were identified, small bowel with FDG uptake, concerning for metastatic disease in the lungs.  Metastatic lymphadenopathy was noted in the abdomen and retroperitoneum.  This scan will serve as a good baseline for future compression.    Plan for palliative systemic chemoimmunotherapy with cisplatin , gemcitabine , durvalumab . Scheduled to start C1D1 from 01/01/24.   Restaging PET scan on 03/21/2024 showed overall improvement in disease.  Oncology History  Adenocarcinoma of gallbladder (HCC)  12/24/2023 Initial Diagnosis   Adenocarcinoma of gallbladder (HCC)   12/24/2023 Cancer Staging   Staging form: Gallbladder, AJCC 8th Edition - Clinical: Stage  IVB (cTX, cNX, pM1) - Signed by Autumn Millman, MD on 12/24/2023 Histologic grade (G): G2 Histologic grading  system: 3 grade system   01/01/2024 -  Chemotherapy   Patient is on Treatment Plan : BILIARY TRACT Cisplatin  + Gemcitabine  D1,8 + Durvalumab  (1500) D1 q21d / Durvalumab  (1500) q28d         REVIEW OF SYSTEMS:   Review of Systems - Oncology  All other pertinent systems were reviewed with the patient and are negative.  ALLERGIES: She is allergic to sulfa antibiotics, forteo  [parathyroid hormone (recomb)], atorvastatin, and metformin hcl.  MEDICATIONS:  Current Outpatient Medications  Medication Sig Dispense Refill   albuterol  (VENTOLIN  HFA) 108 (90 Base) MCG/ACT inhaler Inhale 1-2 puffs into the lungs every 6 (six) hours as needed for wheezing or shortness of breath. 8 g 6   aspirin  81 MG EC tablet Take 81 mg by mouth daily.     budesonide -glycopyrrolate -formoterol  (BREZTRI  AEROSPHERE) 160-9-4.8 MCG/ACT AERO inhaler Inhale 2 puffs into the lungs in the morning and at bedtime. 10.7 g 11   dexamethasone  (DECADRON ) 4 MG tablet Take 2 tablets (8 mg) by mouth daily x 3 days starting the day after cisplatin  chemotherapy. Take with food. 30 tablet 1   docusate sodium  (COLACE) 100 MG capsule Take 500 mg by mouth at bedtime.     lidocaine -prilocaine  (EMLA ) cream Apply to affected area once 30 g 3   magnesium  oxide (MAG-OX) 400 (240 Mg) MG tablet Take 1 tablet (400 mg total) by mouth 2 (two) times daily. 60 tablet 1   megestrol  (MEGACE  ES) 625 MG/5ML suspension Take 5 mLs (625 mg total) by mouth daily. 150 mL 3   metoprolol  succinate (TOPROL -XL) 50 MG 24 hr tablet TAKE 1 TABLET BY MOUTH ONCE DAILY. TAKE WITH OR IMMEDIATELY FOLLOWING A MEAL 90 tablet 1   morphine  (MS CONTIN ) 15 MG 12 hr tablet Take 1 tablet (15 mg total) by mouth every 12 (twelve) hours. 60 tablet 0   omeprazole  (PRILOSEC) 20 MG capsule TAKE 1 CAPSULE BY MOUTH TWICE DAILY BEFORE A MEAL 60 capsule 0   ondansetron  (ZOFRAN ) 4 MG tablet Take 1 tablet (4 mg total) by mouth every 6 (six) hours as needed for nausea. 30 tablet 0    ondansetron  (ZOFRAN ) 8 MG tablet Take 1 tablet (8 mg total) by mouth every 8 (eight) hours as needed for nausea or vomiting. Start on the third day after cisplatin . 30 tablet 1   oxyCODONE  10 MG TABS Take 1-1.5 tablets (10-15 mg total) by mouth every 4 (four) hours as needed for moderate pain (pain score 4-6) or severe pain (pain score 7-10). (Patient taking differently: Take 10-15 mg by mouth every 4 (four) hours as needed for moderate pain (pain score 4-6) or severe pain (pain score 7-10). Pt reports she takes this once in awhile) 60 tablet 0   polyethylene glycol (MIRALAX  / GLYCOLAX ) 17 g packet Take 17 g by mouth 2 (two) times daily.     prochlorperazine  (COMPAZINE ) 10 MG tablet Take 1 tablet (10 mg total) by mouth every 6 (six) hours as needed (Nausea or vomiting). 30 tablet 1   rosuvastatin  (CRESTOR ) 10 MG tablet Take 1 tablet by mouth once daily 90 tablet 0   spironolactone  (ALDACTONE ) 25 MG tablet Take 25 mg by mouth daily.     magic mouthwash (nystatin, lidocaine , diphenhydrAMINE , alum & mag hydroxide) suspension Take 5 mLs by mouth every 4 (four) hours as needed for mouth pain. Suspension contains equal amounts of Maalox,  nystatin, diphenhydramine  and lidocaine . 1:1:1:1 ratio. 250 mL 1   No current facility-administered medications for this visit.    VITALS:   Blood pressure (!) 157/59, pulse 75, temperature (!) 97.5 F (36.4 C), temperature source Temporal, resp. rate 19, weight 163 lb 4.8 oz (74.1 kg), SpO2 100%.  Wt Readings from Last 3 Encounters:  04/21/24 163 lb 4.8 oz (74.1 kg)  04/11/24 161 lb (73 kg)  04/07/24 158 lb 14.4 oz (72.1 kg)    Body mass index is 28.93 kg/m.    Onc Performance Status - 04/21/24 0934       ECOG Perf Status   ECOG Perf Status Restricted in physically strenuous activity but ambulatory and able to carry out work of a light or sedentary nature, e.g., light house work, office work      KPS SCALE   KPS % SCORE Normal activity with effort, some s/s  of disease           PHYSICAL EXAM:   Physical Exam Constitutional:      General: She is not in acute distress.    Appearance: Normal appearance.  HENT:     Head: Normocephalic and atraumatic.  Cardiovascular:     Rate and Rhythm: Normal rate.  Pulmonary:     Effort: Pulmonary effort is normal. No respiratory distress.  Abdominal:     General: There is no distension.  Neurological:     General: No focal deficit present.     Mental Status: She is alert and oriented to person, place, and time.  Psychiatric:        Mood and Affect: Mood normal.        Behavior: Behavior normal.       LABORATORY DATA:   I have reviewed the data as listed.  Results for orders placed or performed in visit on 04/21/24  Magnesium   Result Value Ref Range   Magnesium  1.3 (L) 1.7 - 2.4 mg/dL  CMP (Cancer Center only)  Result Value Ref Range   Sodium 143 135 - 145 mmol/L   Potassium 3.1 (L) 3.5 - 5.1 mmol/L   Chloride 109 98 - 111 mmol/L   CO2 27 22 - 32 mmol/L   Glucose, Bld 118 (H) 70 - 99 mg/dL   BUN 10 8 - 23 mg/dL   Creatinine 9.17 9.55 - 1.00 mg/dL   Calcium  8.2 (L) 8.9 - 10.3 mg/dL   Total Protein 5.6 (L) 6.5 - 8.1 g/dL   Albumin  3.3 (L) 3.5 - 5.0 g/dL   AST 17 15 - 41 U/L   ALT 17 0 - 44 U/L   Alkaline Phosphatase 140 (H) 38 - 126 U/L   Total Bilirubin 0.5 0.0 - 1.2 mg/dL   GFR, Estimated >39 >39 mL/min   Anion gap 7 5 - 15  CBC with Differential (Cancer Center Only)  Result Value Ref Range   WBC Count 13.3 (H) 4.0 - 10.5 K/uL   RBC 2.28 (L) 3.87 - 5.11 MIL/uL   Hemoglobin 7.6 (L) 12.0 - 15.0 g/dL   HCT 78.1 (L) 63.9 - 53.9 %   MCV 95.6 80.0 - 100.0 fL   MCH 33.3 26.0 - 34.0 pg   MCHC 34.9 30.0 - 36.0 g/dL   RDW 81.1 (H) 88.4 - 84.4 %   Platelet Count 97 (L) 150 - 400 K/uL   nRBC 0.2 0.0 - 0.2 %   Neutrophils Relative % 70 %   Neutro Abs 9.3 (H) 1.7 - 7.7 K/uL  Lymphocytes Relative 11 %   Lymphs Abs 1.4 0.7 - 4.0 K/uL   Monocytes Relative 13 %   Monocytes  Absolute 1.7 (H) 0.1 - 1.0 K/uL   Eosinophils Relative 0 %   Eosinophils Absolute 0.0 0.0 - 0.5 K/uL   Basophils Relative 1 %   Basophils Absolute 0.1 0.0 - 0.1 K/uL   WBC Morphology See Note    Smear Review Normal platelet morphology    Immature Granulocytes 5 %   Abs Immature Granulocytes 0.72 (H) 0.00 - 0.07 K/uL   Polychromasia PRESENT    Ovalocytes PRESENT        RADIOGRAPHIC STUDIES:  I have personally reviewed the radiological images as listed and agree with the findings in the report.  NM PET Image Restag (PS) Skull Base To Thigh EXAM: PET AND CT SKULL BASE TO MID THIGH 03/21/2024 01:07:18 PM  TECHNIQUE: PET imaging was acquired from the base of the skull to the mid thighs. Non-contrast enhanced computed tomography was obtained for attenuation correction and anatomic localization.  RADIOPHARMACEUTICAL: 8.2 mCi F-18 FDG Uptake time 60 minutes. Glucose level 103 mg/dl.  COMPARISON: PET of 12/28/2023.  CLINICAL HISTORY: Restaging stage IV adenocarcinoma of gallbladder.  FINDINGS:  HEAD AND NECK: No cervical nodal hypermetabolism. No cervical adenopathy. Left carotid atherosclerosis.  CHEST: No thoracic nodal hypermetabolism. Index right upper lobe pulmonary nodule measures 7 mm and SUV 2.0 on image 23/7 versus 8 mm and suv 3.7 previously. SABRA An index superior segment left lower lobe nodule on the prior exam that measured 6 mm and SUV 3.3 has essentially resolved. No residual hypermetabolism in this area. Innumerable smaller pulmonary nodules are also less distinct today, for example within the right lower lobe at 7 mm in image 46/7 versus 10 mm on the prior exam.  ABDOMEN AND PELVIS: Hypermetabolism along the gallbladder fundus and extending into segment 4b of the liver is less distinct today, for example at SUV 7.1 on image 116/4, similar to SUV 6.8 on the prior exam. Porta hepatis node measures 1.0 cm and SUV 5.4 on image 108/4 compared to 1.2 cm and  SUV 5.1 on the prior exam. The abdominal retroperitoneal hypermetabolic nodes have resolved. Anterior inferior omental hypermetabolic implant measures 2.7 x 1.7 cm and SUV 4.9 on image 153/4 compared to 2.9 x 1.6 cm and SUV 5.3 on the prior exam. Smaller more cephalad abdominal omental implants are similar. Left pelvic/adnexal implant measures 4.8 x 2.6 cm and SUV 5.6 on image 163/4 versus 4.2 x 3.0 cm and SUV 17.1 on the prior exam. Diffuse colonic hypermetabolism, most significant in the low rectum and anus at SUV 14.9, no CT correlate. Normal adrenal glands. Abdominal aortic atherosclerosis. Hysterectomy. Pelvic floor laxity.  BONES AND SOFT TISSUE: No abnormal FDG activity localizes to the bones. No metabolically active aggressive osseous lesion. Thoracolumbar spine fixation.  VASCULATURE: Right port-a-cath tip mid right atrium. Aortic and coronary artery calcifications.  IMPRESSION: 1. Minimal response to therapy, as evidenced by decreased size and hypermetabolism of Pulmonary Metastasis, gallbladder primary and abdominal nodal metastasis. Omental and left pelvic peritoneal/adnexal implants are relatively similar. 2.  No new sites of disease. 3. Anorectal hypermetabolism is most likely physiologic 4. Incidental findings, including: Aortic atherosclerosis (icd10-i70.0). Coronary artery atherosclerosis.  Electronically signed by: Rockey Kilts MD 03/21/2024 01:33 PM EDT RP Workstation: [id number]     CODE STATUS:  Code Status History     Date Active Date Inactive Code Status Order ID Comments User Context   12/14/2023 1601  12/16/2023 2004 Limited: Do not attempt resuscitation (DNR) -DNR-LIMITED -Do Not Intubate/DNI  [id number]  Caleen Burgess BROCKS, MD ED   09/17/2022 1236 09/18/2022 1519 Full Code [id number]  Onetha Kuba, MD Inpatient   02/27/2021 1602 03/09/2021 2325 Full Code [id number]  Onetha Kuba, MD Inpatient   06/27/2020 1110 06/28/2020 0504 Full Code [id number]  Derrill Dawn, MD HOV     Questions for Most Recent Historical Code Status (Order [id number])     Question Answer   If pulseless and not breathing No CPR or chest compressions.   In Pre-Arrest Conditions (Patient Is Breathing and Has A Pulse) Do not intubate. Provide all appropriate non-invasive medical interventions. Avoid ICU transfer unless indicated or required.   Consent: Discussion documented in EHR or advanced directives reviewed                Advance Directive Documentation    Flowsheet Row Most Recent Value  Type of Advance Directive Healthcare Power of Attorney, Living will  Pre-existing out of facility DNR order (yellow form or pink MOST form) --  MOST Form in Place? --    Orders Placed This Encounter  Procedures   Sample to Blood Bank    Standing Status:   Standing    Number of Occurrences:   12    Next Expected Occurrence:   04/22/2024    Expiration Date:   04/21/2025     Future Appointments  Date Time Provider Department Center  04/22/2024  9:30 AM [facility] INFUSION [facility] None  04/23/2024  9:00 AM [facility] INFUSION [facility] None  04/28/2024  9:15 AM [facility] FLUSH [facility] None  04/28/2024  9:45 AM Nahiara Kretzschmar, MD [facility] None  04/29/2024 10:00 AM [facility] INFUSION [facility] None  05/02/2024  1:30 PM [facility] FLUSH [facility] None     This document was completed utilizing speech recognition software. Grammatical errors, random word insertions, pronoun errors, and incomplete sentences are an occasional consequence of this system due to software limitations, ambient noise, and hardware issues. Any formal questions or concerns about the content, text or information contained within the body of this dictation should be directly addressed to the provider for clarification.

## 2024-04-21 NOTE — Assessment & Plan Note (Addendum)
 Hemoglobin at 7.6 g/dL, requiring intermittent blood transfusions. Transfusion needed before next week's chemotherapy to prevent further hemoglobin drop. - Attempt to arrange blood transfusion today; if not possible, schedule for Saturday. - Ensure transfusion is completed before next week's chemotherapy session.

## 2024-04-22 ENCOUNTER — Other Ambulatory Visit: Payer: Self-pay | Admitting: Oncology

## 2024-04-22 ENCOUNTER — Inpatient Hospital Stay

## 2024-04-22 ENCOUNTER — Other Ambulatory Visit: Payer: Self-pay

## 2024-04-22 ENCOUNTER — Other Ambulatory Visit (HOSPITAL_COMMUNITY): Payer: Self-pay

## 2024-04-22 VITALS — BP 163/54 | HR 72 | Temp 98.7°F | Resp 18

## 2024-04-22 DIAGNOSIS — C23 Malignant neoplasm of gallbladder: Secondary | ICD-10-CM

## 2024-04-22 DIAGNOSIS — Z5112 Encounter for antineoplastic immunotherapy: Secondary | ICD-10-CM | POA: Diagnosis not present

## 2024-04-22 LAB — T4: T4, Total: 11.2 ug/dL (ref 4.5–12.0)

## 2024-04-22 MED ORDER — SODIUM CHLORIDE 0.9 % IV SOLN
1500.0000 mg | Freq: Once | INTRAVENOUS | Status: AC
  Administered 2024-04-22: 1500 mg via INTRAVENOUS
  Filled 2024-04-22: qty 30

## 2024-04-22 MED ORDER — PALONOSETRON HCL INJECTION 0.25 MG/5ML
0.2500 mg | Freq: Once | INTRAVENOUS | Status: AC
  Administered 2024-04-22: 0.25 mg via INTRAVENOUS
  Filled 2024-04-22: qty 5

## 2024-04-22 MED ORDER — DIPHENHYDRAMINE HCL 50 MG/ML IJ SOLN
25.0000 mg | Freq: Once | INTRAMUSCULAR | Status: AC
  Administered 2024-04-22: 25 mg via INTRAVENOUS
  Filled 2024-04-22: qty 1

## 2024-04-22 MED ORDER — SODIUM CHLORIDE 0.9 % IV SOLN
1000.0000 mg/m2 | Freq: Once | INTRAVENOUS | Status: AC
  Administered 2024-04-22: 1786 mg via INTRAVENOUS
  Filled 2024-04-22: qty 46.97

## 2024-04-22 MED ORDER — MAGNESIUM SULFATE 2 GM/50ML IV SOLN
2.0000 g | Freq: Once | INTRAVENOUS | Status: AC
  Administered 2024-04-22: 2 g via INTRAVENOUS
  Filled 2024-04-22: qty 50

## 2024-04-22 MED ORDER — SODIUM CHLORIDE 0.9 % IV SOLN: INTRAVENOUS | Status: DC

## 2024-04-22 MED ORDER — POTASSIUM CHLORIDE IN NACL 20-0.9 MEQ/L-% IV SOLN
Freq: Once | INTRAVENOUS | Status: AC
  Filled 2024-04-22: qty 1000

## 2024-04-22 MED ORDER — METHYLPREDNISOLONE 4 MG PO TBPK
ORAL_TABLET | ORAL | 1 refills | Status: DC
  Filled 2024-04-22: qty 21, 6d supply, fill #0

## 2024-04-22 MED ORDER — SODIUM CHLORIDE 0.9 % IV SOLN
25.0000 mg/m2 | Freq: Once | INTRAVENOUS | Status: AC
  Administered 2024-04-22: 50 mg via INTRAVENOUS
  Filled 2024-04-22: qty 50

## 2024-04-22 MED ORDER — DEXAMETHASONE SOD PHOSPHATE PF 10 MG/ML IJ SOLN
10.0000 mg | Freq: Once | INTRAMUSCULAR | Status: AC
  Administered 2024-04-22: 10 mg via INTRAVENOUS

## 2024-04-22 MED ORDER — APREPITANT 130 MG/18ML IV EMUL
130.0000 mg | Freq: Once | INTRAVENOUS | Status: AC
  Administered 2024-04-22: 130 mg via INTRAVENOUS
  Filled 2024-04-22: qty 18

## 2024-04-22 MED ORDER — POTASSIUM CHLORIDE CRYS ER 20 MEQ PO TBCR
20.0000 meq | EXTENDED_RELEASE_TABLET | Freq: Once | ORAL | Status: AC
  Administered 2024-04-22: 20 meq via ORAL
  Filled 2024-04-22: qty 1

## 2024-04-22 NOTE — Patient Instructions (Signed)
 [facility] - A DEPT OF [facility]  Discharge Instructions: Thank you for choosing [facility] to provide your oncology and hematology care.   If you have a lab appointment with the Cancer Center, please go directly to the Cancer Center and check in at the registration area.   Wear comfortable clothing and clothing appropriate for easy access to any Portacath or PICC line.   We strive to give you quality time with your provider. You may need to reschedule your appointment if you arrive late (15 or more minutes).  Arriving late affects you and other patients whose appointments are after yours.  Also, if you miss three or more appointments without notifying the office, you may be dismissed from the clinic at the provider's discretion.      For prescription refill requests, have your pharmacy contact our office and allow 72 hours for refills to be completed.    Today you received the following chemotherapy and/or immunotherapy agents: gemzar , cisplatin , durvalumab       To help prevent nausea and vomiting after your treatment, we encourage you to take your nausea medication as directed.  BELOW ARE SYMPTOMS THAT SHOULD BE REPORTED IMMEDIATELY: *FEVER GREATER THAN 100.4 F (38 C) OR HIGHER *CHILLS OR SWEATING *NAUSEA AND VOMITING THAT IS NOT CONTROLLED WITH YOUR NAUSEA MEDICATION *UNUSUAL SHORTNESS OF BREATH *UNUSUAL BRUISING OR BLEEDING *URINARY PROBLEMS (pain or burning when urinating, or frequent urination) *BOWEL PROBLEMS (unusual diarrhea, constipation, pain near the anus) TENDERNESS IN MOUTH AND THROAT WITH OR WITHOUT PRESENCE OF ULCERS (sore throat, sores in mouth, or a toothache) UNUSUAL RASH, SWELLING OR PAIN  UNUSUAL VAGINAL DISCHARGE OR ITCHING   Items with * indicate a potential emergency and should be followed up as soon as possible or go to the Emergency Department if any problems should occur.  Please show the CHEMOTHERAPY ALERT  CARD or IMMUNOTHERAPY ALERT CARD at check-in to the Emergency Department and triage nurse.  Should you have questions after your visit or need to cancel or reschedule your appointment, please contact [facility] - A DEPT OF [facility]  Dept: [phone]  and follow the prompts.  Office hours are 8:00 a.m. to 4:30 p.m. Monday - Friday. Please note that voicemails left after 4:00 p.m. may not be returned until the following business day.  We are closed weekends and major holidays. You have access to a nurse at all times for urgent questions. Please call the main number to the clinic Dept: [phone] and follow the prompts.   For any non-urgent questions, you may also contact your provider using MyChart. We now offer e-Visits for anyone 68 and older to request care online for non-urgent symptoms. For details visit [email or web address].   Also download the MyChart app! Go to the app store, search MyChart, open the app, select [place], and log in with your MyChart username and password.

## 2024-04-23 ENCOUNTER — Inpatient Hospital Stay

## 2024-04-23 DIAGNOSIS — C23 Malignant neoplasm of gallbladder: Secondary | ICD-10-CM

## 2024-04-23 DIAGNOSIS — D701 Agranulocytosis secondary to cancer chemotherapy: Secondary | ICD-10-CM

## 2024-04-23 DIAGNOSIS — D72829 Elevated white blood cell count, unspecified: Secondary | ICD-10-CM

## 2024-04-23 DIAGNOSIS — D6481 Anemia due to antineoplastic chemotherapy: Secondary | ICD-10-CM

## 2024-04-23 DIAGNOSIS — Z5112 Encounter for antineoplastic immunotherapy: Secondary | ICD-10-CM | POA: Diagnosis not present

## 2024-04-23 MED ORDER — ACETAMINOPHEN 325 MG PO TABS
650.0000 mg | ORAL_TABLET | Freq: Once | ORAL | Status: AC
  Administered 2024-04-23: 650 mg via ORAL
  Filled 2024-04-23: qty 2

## 2024-04-23 MED ORDER — SODIUM CHLORIDE 0.9% IV SOLUTION
250.0000 mL | INTRAVENOUS | Status: DC
  Administered 2024-04-23: 250 mL via INTRAVENOUS

## 2024-04-23 MED ORDER — DIPHENHYDRAMINE HCL 25 MG PO CAPS
25.0000 mg | ORAL_CAPSULE | Freq: Once | ORAL | Status: AC
  Administered 2024-04-23: 25 mg via ORAL
  Filled 2024-04-23: qty 1

## 2024-04-23 NOTE — Patient Instructions (Signed)
 Blood Transfusion, Adult A blood transfusion is a procedure in which you receive blood through an IV tube. You may need this procedure because of: A bleeding disorder. An illness. An injury. A surgery. The blood may come from someone else (a donor). You may also be able to donate blood for yourself before a surgery. The blood given in a transfusion may be made up of different types of cells. You may get: Red blood cells. These carry oxygen to the cells in the body. Platelets. These help your blood to clot. Plasma. This is the liquid part of your blood. It carries proteins and other substances through the body. White blood cells. These help you fight infections. If you have a clotting disorder, you may also get other types of blood products. Depending on the type of blood product, this procedure may take 1-4 hours to complete. Tell your doctor about: Any bleeding problems you have. Any reactions you have had during a blood transfusion in the past. Any allergies you have. All medicines you are taking, including vitamins, herbs, eye drops, creams, and over-the-counter medicines. Any surgeries you have had. Any medical conditions you have. Whether you are pregnant or may be pregnant. What are the risks? Talk with your health care provider about risks. The most common problems include: A mild allergic reaction. This includes red, swollen areas of skin (hives) and itching. Fever or chills. This may be the body's response to new blood cells received. This may happen during or up to 4 hours after the transfusion. More serious problems may include: A serious allergic reaction. This includes breathing trouble or swelling around the face and lips. Too much fluid in the lungs. This may cause breathing problems. Lung injury. This causes breathing trouble and low oxygen in the blood. This can happen within hours of the transfusion or days later. Too much iron . This can happen after getting many blood  transfusions over a period of time. An infection or virus passed through the blood. This is rare. Donated blood is carefully tested before it is given. Your body's defense system (immune system) trying to attack the new blood cells. This is rare. Symptoms may include fever, chills, nausea, low blood pressure, and low back or chest pain. Donated cells attacking healthy tissues. This is rare. What happens before the procedure? You will have a blood test to find out your blood type. The test also finds out what type of blood your body will accept and matches it to the donor type. If you are going to have a planned surgery, you may be able to donate your own blood. This may be done in case you need a transfusion. You will have your temperature, blood pressure, and pulse checked. You may receive medicine to help prevent an allergic reaction. This may be done if you have had a reaction to a transfusion before. This medicine may be given to you by mouth or through an IV tube. What happens during the procedure?  An IV tube will be put into one of your veins. The bag of blood will be attached to your IV tube. Then, the blood will enter through your vein. Your temperature, blood pressure, and pulse will be checked often. This is done to find early signs of a transfusion reaction. Tell your nurse right away if you have any of these symptoms: Shortness of breath or trouble breathing. Chest or back pain. Fever or chills. Red, swollen areas of skin or itching. If you have any signs  or symptoms of a reaction, your transfusion will be stopped. You may also be given medicine. When the transfusion is finished, your IV tube will be taken out. Pressure may be put on the IV site for a few minutes. A bandage (dressing) will be put on the IV site. The procedure may vary among doctors and hospitals. What happens after the procedure? You will be monitored until you leave the hospital or clinic. This includes  checking your temperature, blood pressure, pulse, breathing rate, and blood oxygen level. Your blood may be tested to see how you have responded to the transfusion. You may be warmed with fluids or blankets. This is done to keep the temperature of your body normal. If you have your procedure in an outpatient setting, you will be told whom to contact to report any reactions. Where to find more information Visit the [employer]: redcross.org Summary A blood transfusion is a procedure in which you receive blood through an IV tube. The blood you are given may be made up of different blood cells. You may receive red blood cells, platelets, plasma, or white blood cells. Your temperature, blood pressure, and pulse will be checked often. After the procedure, your blood may be tested to see how you have responded. This information is not intended to replace advice given to you by your health care provider. Make sure you discuss any questions you have with your health care provider. Document Revised: 09/27/2021 Document Reviewed: 09/27/2021 Elsevier Patient Education  2024 [employer].

## 2024-04-25 LAB — TYPE AND SCREEN
ABO/RH(D): B POS
Antibody Screen: NEGATIVE
Unit division: 0

## 2024-04-25 LAB — BPAM RBC
Blood Product Expiration Date: 202511052359
ISSUE DATE / TIME: 202510110928
Unit Type and Rh: 7300

## 2024-04-26 ENCOUNTER — Other Ambulatory Visit: Payer: Self-pay

## 2024-04-26 ENCOUNTER — Telehealth: Payer: Self-pay

## 2024-04-26 ENCOUNTER — Other Ambulatory Visit (HOSPITAL_COMMUNITY): Payer: Self-pay

## 2024-04-26 MED ORDER — FLUCONAZOLE 200 MG PO TABS
200.0000 mg | ORAL_TABLET | Freq: Every day | ORAL | 0 refills | Status: DC
  Filled 2024-04-26: qty 14, 14d supply, fill #0

## 2024-04-26 NOTE — Telephone Encounter (Signed)
 Followed up with patient regarding her discomfort to mouth and throat due to active case of thrush. Patient already using Magic Mouthwash with no relief. Was able to send an rx to [facility] for oral fluconazole  to further tx the thrush. Patient agreed with this plan.

## 2024-04-27 ENCOUNTER — Other Ambulatory Visit (HOSPITAL_COMMUNITY): Payer: Self-pay

## 2024-04-27 ENCOUNTER — Other Ambulatory Visit: Payer: Self-pay

## 2024-04-28 ENCOUNTER — Encounter: Payer: Self-pay | Admitting: Oncology

## 2024-04-28 ENCOUNTER — Other Ambulatory Visit: Payer: Self-pay

## 2024-04-28 ENCOUNTER — Telehealth: Payer: Self-pay

## 2024-04-28 ENCOUNTER — Other Ambulatory Visit (HOSPITAL_COMMUNITY): Payer: Self-pay

## 2024-04-28 ENCOUNTER — Inpatient Hospital Stay

## 2024-04-28 ENCOUNTER — Inpatient Hospital Stay: Admitting: Oncology

## 2024-04-28 VITALS — BP 137/56 | HR 77 | Temp 97.1°F | Resp 17 | Ht 63.0 in | Wt 154.0 lb

## 2024-04-28 DIAGNOSIS — D6481 Anemia due to antineoplastic chemotherapy: Secondary | ICD-10-CM

## 2024-04-28 DIAGNOSIS — T451X5A Adverse effect of antineoplastic and immunosuppressive drugs, initial encounter: Secondary | ICD-10-CM

## 2024-04-28 DIAGNOSIS — R1310 Dysphagia, unspecified: Secondary | ICD-10-CM

## 2024-04-28 DIAGNOSIS — G893 Neoplasm related pain (acute) (chronic): Secondary | ICD-10-CM | POA: Diagnosis not present

## 2024-04-28 DIAGNOSIS — E876 Hypokalemia: Secondary | ICD-10-CM | POA: Diagnosis not present

## 2024-04-28 DIAGNOSIS — Z5112 Encounter for antineoplastic immunotherapy: Secondary | ICD-10-CM | POA: Diagnosis not present

## 2024-04-28 DIAGNOSIS — C221 Intrahepatic bile duct carcinoma: Secondary | ICD-10-CM

## 2024-04-28 DIAGNOSIS — C23 Malignant neoplasm of gallbladder: Secondary | ICD-10-CM

## 2024-04-28 LAB — CMP (CANCER CENTER ONLY)
ALT: 46 U/L — ABNORMAL HIGH (ref 0–44)
AST: 35 U/L (ref 15–41)
Albumin: 3.3 g/dL — ABNORMAL LOW (ref 3.5–5.0)
Alkaline Phosphatase: 144 U/L — ABNORMAL HIGH (ref 38–126)
Anion gap: 6 (ref 5–15)
BUN: 16 mg/dL (ref 8–23)
CO2: 26 mmol/L (ref 22–32)
Calcium: 8.7 mg/dL — ABNORMAL LOW (ref 8.9–10.3)
Chloride: 107 mmol/L (ref 98–111)
Creatinine: 0.9 mg/dL (ref 0.44–1.00)
GFR, Estimated: >60 mL/min (ref >60)
Glucose, Bld: 128 mg/dL — ABNORMAL HIGH (ref 70–99)
Potassium: 3.4 mmol/L — ABNORMAL LOW (ref 3.5–5.1)
Sodium: 139 mmol/L (ref 135–145)
Total Bilirubin: 0.8 mg/dL (ref 0.0–1.2)
Total Protein: 5.6 g/dL — ABNORMAL LOW (ref 6.5–8.1)

## 2024-04-28 LAB — MAGNESIUM: Magnesium: 1.3 mg/dL — ABNORMAL LOW (ref 1.7–2.4)

## 2024-04-28 LAB — CBC WITH DIFFERENTIAL (CANCER CENTER ONLY)
Abs Immature Granulocytes: 0.03 K/uL (ref 0.00–0.07)
Basophils Absolute: 0 K/uL (ref 0.0–0.1)
Basophils Relative: 0 %
Eosinophils Absolute: 0 K/uL (ref 0.0–0.5)
Eosinophils Relative: 0 %
HCT: 25.3 % — ABNORMAL LOW (ref 36.0–46.0)
Hemoglobin: 8.9 g/dL — ABNORMAL LOW (ref 12.0–15.0)
Immature Granulocytes: 1 %
Lymphocytes Relative: 22 %
Lymphs Abs: 1.1 K/uL (ref 0.7–4.0)
MCH: 31.9 pg (ref 26.0–34.0)
MCHC: 35.2 g/dL (ref 30.0–36.0)
MCV: 90.7 fL (ref 80.0–100.0)
Monocytes Absolute: 0.2 K/uL (ref 0.1–1.0)
Monocytes Relative: 5 %
Neutro Abs: 3.6 K/uL (ref 1.7–7.7)
Neutrophils Relative %: 72 %
Platelet Count: 94 K/uL — ABNORMAL LOW (ref 150–400)
RBC: 2.79 MIL/uL — ABNORMAL LOW (ref 3.87–5.11)
RDW: 18.6 % — ABNORMAL HIGH (ref 11.5–15.5)
WBC Count: 5 K/uL (ref 4.0–10.5)
nRBC: 0 % (ref 0.0–0.2)

## 2024-04-28 LAB — SAMPLE TO BLOOD BANK

## 2024-04-28 MED ORDER — NYSTATIN 100000 UNIT/ML MT SUSP
5.0000 mL | OROMUCOSAL | 1 refills | Status: DC | PRN
  Filled 2024-04-28: qty 420, 14d supply, fill #0

## 2024-04-28 NOTE — Telephone Encounter (Signed)
 Received secure chat from Dr. Charlanne & patient's oncologist Dr. Autumn requesting patient be seen for OV & EGD ASAP for odynophagia, metastatic [place]ol[place] Ca. Scheduled OV with patient for 10/20 at 1:30 pm with [place], [place] & EGD tentatively for 10/22 at 10:30 am with Dr. Avram. Amb ref placed. Pt is aware instructions will be given to her on Monday if they decide to proceed with EGD on Wednesday. Pt had no further questions.

## 2024-04-28 NOTE — Progress Notes (Signed)
 [facility]  ONCOLOGY CLINIC PROGRESS NOTE   Patient Care Team: [name] as PCP - General (Physician Assistant) Pcp, [name], DO (Family Medicine) Pa, [facility] [name], RN as Oncology Nurse Navigator  PATIENT NAME: Janice Pace   MR#: [id number] DOB: 05-Sep-1945  Date of visit: 04/28/2024   ASSESSMENT & PLAN:   Janice Pace is a 78 y.o. 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   Adenocarcinoma of gallbladder [facility]) Please review oncology history for additional details and timeline of events.  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 Biopsy from the omentum confirmed adenocarcinoma, originating from the gallbladder.  Immunostains support diagnosis of gallbladder carcinoma.    Previously I discussed diagnosis, staging, prognosis, plan of care, treatment options.  Reviewed NCCN guidelines. Given stage IV disease, all treatment options are palliative in nature and not curative in intent.  This was discussed with patient, her daughter and son-in-law who were accompanying.  They verbalized understanding.    Discussed the aggressive nature of stage IV gallbladder adenocarcinoma and overall poor prognosis.  Life expectancy with treatment is 1 to 1.5 years; without treatment, less than six months.   NGS panel testing on the specimen and also liquid biopsy for mutation analysis showed no actionable mutations.  Staging PET scan on 12/29/2023 showed known gallbladder mass extending into the liver.  Peritoneal carcinomatosis noted.  Numerous lung nodules were identified, small bowel with FDG uptake, concerning for metastatic disease in the lungs.   Metastatic lymphadenopathy was noted in the abdomen and retroperitoneum.  This scan will serve as a good baseline for future compression.  Discussed results with the patient and family members previously.   Plan made to proceed with palliative systemic chemoimmunotherapy with gemcitabine , cisplatin  and durvalumab .  She started this from 01/01/2024.  Following completion of 4 cycles of chemotherapy, restaging PET scan on 03/21/2024 showed overall improvement with improvement in the lesions in the lungs, lymph nodes, and omentum.   Plan was to continue current management for up to 8 cycles.  If cytopenias or other side effects are prohibitive, we will plan for at least 6 cycles.  Patient was tolerating chemotherapy reasonably well up until now.  Over the last couple of weeks, she has been having progressive skin rash, with concern for immune mediated dermatitis.  She also has odynophagia which is partially relieved with lidocaine  Magic mouthwash.  She also started taking nystatin and Diflucan  as instructed.  Now she has angular cheilitis and worsening skin rash.  Will hold chemotherapy tomorrow (she was due for cycle 6-day 8 tomorrow)  Plan to reevaluate in 1 week to assess her symptoms.  Previously we sent a referral to genetic counselor for genetic testing.  Oral mucositis and presumed candidiasis due to chemotherapy Oral mucositis and candidiasis likely secondary to chemotherapy, presenting with pain and difficulty swallowing. Partial relief with magic mouthwash and Diflucan . Concerns about potential thrush or other infections. - Continue Diflucan  (fluconazole ) for antifungal treatment. - Continue magic mouthwash with lidocaine , swish and let it sit near the lips before swallowing. - Referred to gastroenterology for further evaluation of swallowing difficulties. - Advised taking B complex vitamins. - Recommended baking soda and salt rinse every 2-3 hours while awake.  Drug-induced rash involving  trunk, extremities, and face Rash developed prior to recent chemotherapy, now involving trunk, extremities, and face. Rash on face improved, but new rash on legs. Steroids provided minimal relief. Rash may be related to chemotherapy or immunotherapy. - Given concerns for thrush, we cannot use high dose steroids for her. - Took a break from chemotherapy to prevent worsening of rash. - Continue using topical cream for rash management.  Odynophagia and dysphagia Likely related to oral mucositis and possible esophageal involvement. Concerns about potential esophageal thrush or other infections. - Referred to gastroenterology for evaluation of esophageal involvement. - Continue Diflucan  and magic mouthwash as per oral mucositis plan.  Unintentional weight loss 9 pounds, likely related to oral mucositis and dysphagia affecting nutritional intake.  Chemotherapy management for malignancy Chemotherapy management complicated by adverse effects including rash, oral mucositis, and weight loss. Decision made to pause chemotherapy to prevent further complications. - Paused chemotherapy for two weeks to allow for recovery and reassessment. - Will reassess chemotherapy plan after two-week break.  I reviewed lab results and outside records for this visit and discussed relevant results with the patient. Diagnosis, plan of care and treatment options were also discussed in detail with the patient. Opportunity provided to ask questions and answers provided to her apparent satisfaction. Provided instructions to call our clinic with any problems, questions or concerns prior to return visit. I recommended to continue follow-up with PCP and sub-specialists. She verbalized understanding and agreed with the plan.   NCCN guidelines have been consulted in the planning of this patient's care.  I spent a total of 42 minutes during this encounter with the patient including review of chart and various tests results,  discussions about plan of care and coordination of care plan.  Chinita Patten, MD  04/28/2024 12:27 PM  [facility] - A DEPT OF [facility] [address] [place] [address] Dept: [phone] Dept Fax: [phone]    CHIEF COMPLAINT/ REASON FOR VISIT:   Stage IV gallbladder adenocarcinoma with peritoneal mets and lung mets.  Current Treatment: Palliative systemic chemoimmunotherapy with cisplatin , gemcitabine , durvalumab  started from 01/01/2024.  INTERVAL HISTORY:    Discussed the use of AI scribe software for clinical note transcription with the patient, who gave verbal consent to proceed.  History of Present Illness  Janice Pace is a 78 year old female who presents with a sore throat and difficulty swallowing.  She has been experiencing a sore throat and odynophagia, which persists despite previous attempts at relief. The pain is localized in her throat and lower down, without any associated fever or chills. No symptoms of acid reflux or cough are present. The pain is primarily during swallowing, and she has not experienced any issues with eating. No sensation of food getting stuck in her throat.  She is currently taking omeprazole  twice daily for acid reflux and has been using dexamethasone  for three days following chemotherapy. She questions whether the medication might be contributing to her symptoms.  In addition to her sore throat, she mentions a recent blister that caused some blurry vision, although it has mostly resolved. She is unsure of the cause but notes it might have been an insect bite.  Recent blood work shows a white blood cell count slightly above normal and a decreasing platelet count, currently at 97,000. Her hemoglobin is low at 7.6 g/dL, which is concerning given her ongoing chemotherapy treatment. She has been managing  bowel movements with laxatives and reports no issues in that  regard.  She is not currently taking any potassium supplements at home, and her potassium and magnesium  levels are low, which are being addressed during her visits.   Janice Pace is a 78 year old female with cancer undergoing chemotherapy who presents with a worsening rash and oral pain.  She has a rash that began prior to her last immunotherapy treatment. The rash is primarily located on her arms, chest, and back, and has recently spread to her legs. It is painful, particularly in her mouth, and has not improved significantly with steroid treatment. She has been using a cream and 'magic mouthwash.' The rash on her face has improved compared to the first week. No current itching of the rash, but it was painful last night.  She experiences significant oral pain, especially when swallowing, described as feeling like 'it's broken all the way down' and causing heartburn. This has made eating difficult, contributing to a weight loss of nine pounds. She has been using a mouthwash and started fluconazole  (Diflucan ) yesterday. She also reports a sensation of a sore knot in her throat, possibly a lymph node.  She developed a new headache this morning, accompanied by blurry vision. She also experienced nausea and vomiting yesterday morning.    I have reviewed the past medical history, past surgical history, social history and family history with the patient and they are unchanged from previous note.  HISTORY OF PRESENT ILLNESS:   ONCOLOGY HISTORY:   78 y.o. lady with past medical history of essential hypertension, osteoarthritis, COPD/asthma, GERD, depression, anxiety, hiatal hernia, chronic back pain presented to the ED on 12/14/2023 with complaints of worsening epigastric/abdominal pain over 2-3 weeks.  Routine blood work was unremarkable in the ER.  However CT abdomen and pelvis showed irregular appearance of the gallbladder with suggestion of capsular thickening along the fundus of the gallbladder.   Gallbladder appeared inseparable from the adjacent portion of the liver.  Clinical picture was concerning for gallbladder carcinoma.  Mesenteric nodular thickening along the mid lower abdominal cavity indicating peritoneal carcinomatosis.  Soft tissue nodular mass in the left lower quadrant region of the pelvis measuring 3.6 x 2.7 cm, likely ovarian neoplastic lesion.  Multiple lung nodules in the lower lobes bilaterally, largest measuring 12 mm in the right lower lobe, concerning for pulmonary metastatic disease.   She was admitted to the hospital for further evaluation and management.  IR was consulted for peritoneal mass biopsy.  CA 19-9 was significant elevated at 14,368.  CEA was increased at 98.7, CA-125 was also increased at 91.3.   On 12/14/2023, MRI abdomen/MRCP showed thickened, contracted gallbladder with a heterogeneous mass arising from the gallbladder fundus, contiguous with the adjacent liver parenchyma of hepatic segment IVB measuring 3.5 x 2.8 cm. Multiple small rim enhancing lesions within the liver parenchyma. Enlarged, necrotic appearing portacaval and porta hepatis lymph nodes. Small, although rim enhancing and abnormal appearing retroperitoneal lymph nodes. Peritoneal stranding and nodularity, incompletely visualized on this examination although present in the left upper quadrant, consistent with peritoneal metastatic disease. Numerous small bilateral pulmonary nodules, better assessed by CT. 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   On 12/15/2023, she underwent CT-guided biopsy of the omental lesion.  Pathology showed metastatic well to moderately differentiated adenocarcinoma. The tumor is positive for cytokeratin 7 and shows focal weak positivity for the GI marker CDX2.  The tumor is negative for the GI markers  cytokeratin 20.  The tumor is also negative for the GU and GYN marker PAX8.  The tumor is negative for the pulmonary  adeno marker TTF-1. This immunohistochemical pattern and histomorphology would be compatible with the clinical suspicion of a gallbladder primary.  The differential diagnosis would also include an upper GI or pancreaticobiliary primary.    Patient presented to our clinic on 12/24/2023 to establish care with us .  Request placed for staging PET scan.  NGS panel testing requested on the specimen and also liquid biopsy obtained.  It showed no actionable mutations.  Staging PET scan on 12/29/2023 showed known gallbladder mass extending into the liver.  Peritoneal carcinomatosis noted.  Numerous lung nodules were identified, small bowel with FDG uptake, concerning for metastatic disease in the lungs.  Metastatic lymphadenopathy was noted in the abdomen and retroperitoneum.  This scan will serve as a good baseline for future compression.    Plan for palliative systemic chemoimmunotherapy with cisplatin , gemcitabine , durvalumab . Scheduled to start C1D1 from 01/01/24.   Restaging PET scan on 03/21/2024 showed overall improvement in disease.  Oncology History  Adenocarcinoma of gallbladder (HCC)  12/24/2023 Initial Diagnosis   Adenocarcinoma of gallbladder (HCC)   12/24/2023 Cancer Staging   Staging form: Gallbladder, AJCC 8th Edition - Clinical: Stage IVB (cTX, cNX, pM1) - Signed by Autumn Millman, MD on 12/24/2023 Histologic grade (G): G2 Histologic grading system: 3 grade system   01/01/2024 -  Chemotherapy   Patient is on Treatment Plan : BILIARY TRACT Cisplatin  + Gemcitabine  D1,8 + Durvalumab  (1500) D1 q21d / Durvalumab  (1500) q28d         REVIEW OF SYSTEMS:   Review of Systems - Oncology  All other pertinent systems were reviewed with the patient and are negative.  ALLERGIES: She is allergic to sulfa antibiotics, forteo  [parathyroid hormone (recomb)], atorvastatin, and metformin hcl.  MEDICATIONS:  Current Outpatient Medications  Medication Sig Dispense Refill   albuterol  (VENTOLIN  HFA) 108  (90 Base) MCG/ACT inhaler Inhale 1-2 puffs into the lungs every 6 (six) hours as needed for wheezing or shortness of breath. 8 g 6   aspirin  81 MG EC tablet Take 81 mg by mouth daily.     budesonide -glycopyrrolate -formoterol  (BREZTRI  AEROSPHERE) 160-9-4.8 MCG/ACT AERO inhaler Inhale 2 puffs into the lungs in the morning and at bedtime. 10.7 g 11   dexamethasone  (DECADRON ) 4 MG tablet Take 2 tablets (8 mg) by mouth daily x 3 days starting the day after cisplatin  chemotherapy. Take with food. 30 tablet 1   docusate sodium  (COLACE) 100 MG capsule Take 500 mg by mouth at bedtime.     fluconazole  (DIFLUCAN ) 200 MG tablet Take 1 tablet (200 mg total) by mouth daily. 14 tablet 0   lidocaine -prilocaine  (EMLA ) cream Apply to affected area once 30 g 3   magnesium  oxide (MAG-OX) 400 (240 Mg) MG tablet Take 1 tablet (400 mg total) by mouth 2 (two) times daily. 60 tablet 1   megestrol  (MEGACE  ES) 625 MG/5ML suspension Take 5 mLs (625 mg total) by mouth daily. 150 mL 3   methylPREDNISolone  (MEDROL  DOSEPAK) 4 MG TBPK tablet Take as directed on package instructions 21 tablet 1   metoprolol  succinate (TOPROL -XL) 50 MG 24 hr tablet TAKE 1 TABLET BY MOUTH ONCE DAILY. TAKE WITH OR IMMEDIATELY FOLLOWING A MEAL 90 tablet 1   morphine  (MS CONTIN ) 15 MG 12 hr tablet Take 1 tablet (15 mg total) by mouth every 12 (twelve) hours. 60 tablet 0   omeprazole  (PRILOSEC) 20 MG capsule  TAKE 1 CAPSULE BY MOUTH TWICE DAILY BEFORE A MEAL 60 capsule 0   ondansetron  (ZOFRAN ) 4 MG tablet Take 1 tablet (4 mg total) by mouth every 6 (six) hours as needed for nausea. 30 tablet 0   ondansetron  (ZOFRAN ) 8 MG tablet Take 1 tablet (8 mg total) by mouth every 8 (eight) hours as needed for nausea or vomiting. Start on the third day after cisplatin . 30 tablet 1   oxyCODONE  10 MG TABS Take 1-1.5 tablets (10-15 mg total) by mouth every 4 (four) hours as needed for moderate pain (pain score 4-6) or severe pain (pain score 7-10). (Patient taking  differently: Take 10-15 mg by mouth every 4 (four) hours as needed for moderate pain (pain score 4-6) or severe pain (pain score 7-10). Pt reports she takes this once in awhile) 60 tablet 0   polyethylene glycol (MIRALAX  / GLYCOLAX ) 17 g packet Take 17 g by mouth 2 (two) times daily.     prochlorperazine  (COMPAZINE ) 10 MG tablet Take 1 tablet (10 mg total) by mouth every 6 (six) hours as needed (Nausea or vomiting). 30 tablet 1   rosuvastatin  (CRESTOR ) 10 MG tablet Take 1 tablet by mouth once daily 90 tablet 0   spironolactone  (ALDACTONE ) 25 MG tablet Take 25 mg by mouth daily.     magic mouthwash (nystatin, lidocaine , diphenhydrAMINE , alum & mag hydroxide) suspension Take 5 mLs by mouth every 4 (four) hours as needed for mouth pain. Suspension contains equal amounts of Maalox, nystatin, diphenhydramine  and lidocaine . 1:1:1:1 ratio. 420 mL 1   nystatin (MYCOSTATIN) 100000 UNIT/ML suspension Take 5 mLs by mouth 4 (four) times daily. (Patient not taking: Reported on 04/28/2024)     No current facility-administered medications for this visit.    VITALS:   Blood pressure (!) 137/56, pulse 77, temperature (!) 97.1 F (36.2 C), temperature source Temporal, resp. rate 17, height 5' 3 (1.6 m), weight 154 lb (69.9 kg), SpO2 99%.  Wt Readings from Last 3 Encounters:  04/28/24 154 lb (69.9 kg)  04/21/24 163 lb 4.8 oz (74.1 kg)  04/11/24 161 lb (73 kg)    Body mass index is 27.28 kg/m.    Onc Performance Status - 04/28/24 1006       ECOG Perf Status   ECOG Perf Status Ambulatory and capable of all selfcare but unable to carry out any work activities.  Up and about more than 50% of waking hours      KPS SCALE   KPS % SCORE Cares for self, unable to carry on normal activity or to do active work            PHYSICAL EXAM:   Physical Exam Constitutional:      General: She is not in acute distress.    Appearance: Normal appearance.  HENT:     Head: Normocephalic and atraumatic.      Mouth/Throat:     Comments: Angular cheilitis Cardiovascular:     Rate and Rhythm: Normal rate.  Pulmonary:     Effort: Pulmonary effort is normal. No respiratory distress.  Abdominal:     General: There is no distension.  Lymphadenopathy:     Cervical: Cervical adenopathy: Possible small left cervical lymph node, nonfixed.  Skin:    Findings: Rash: Scattered maculopapular rash on torso, extremities.  Neurological:     General: No focal deficit present.     Mental Status: She is alert and oriented to person, place, and time.  Psychiatric:  Mood and Affect: Mood normal.        Behavior: Behavior normal.       LABORATORY DATA:   I have reviewed the data as listed.  Results for orders placed or performed in visit on 04/28/24  Magnesium   Result Value Ref Range   Magnesium  1.3 (L) 1.7 - 2.4 mg/dL  CMP (Cancer Center only)  Result Value Ref Range   Sodium 139 135 - 145 mmol/L   Potassium 3.4 (L) 3.5 - 5.1 mmol/L   Chloride 107 98 - 111 mmol/L   CO2 26 22 - 32 mmol/L   Glucose, Bld 128 (H) 70 - 99 mg/dL   BUN 16 8 - 23 mg/dL   Creatinine 9.09 9.55 - 1.00 mg/dL   Calcium  8.7 (L) 8.9 - 10.3 mg/dL   Total Protein 5.6 (L) 6.5 - 8.1 g/dL   Albumin  3.3 (L) 3.5 - 5.0 g/dL   AST 35 15 - 41 U/L   ALT 46 (H) 0 - 44 U/L   Alkaline Phosphatase 144 (H) 38 - 126 U/L   Total Bilirubin 0.8 0.0 - 1.2 mg/dL   GFR, Estimated >39 >39 mL/min   Anion gap 6 5 - 15  CBC with Differential (Cancer Center Only)  Result Value Ref Range   WBC Count 5.0 4.0 - 10.5 K/uL   RBC 2.79 (L) 3.87 - 5.11 MIL/uL   Hemoglobin 8.9 (L) 12.0 - 15.0 g/dL   HCT 74.6 (L) 63.9 - 53.9 %   MCV 90.7 80.0 - 100.0 fL   MCH 31.9 26.0 - 34.0 pg   MCHC 35.2 30.0 - 36.0 g/dL   RDW 81.3 (H) 88.4 - 84.4 %   Platelet Count 94 (L) 150 - 400 K/uL   nRBC 0.0 0.0 - 0.2 %   Neutrophils Relative % 72 %   Neutro Abs 3.6 1.7 - 7.7 K/uL   Lymphocytes Relative 22 %   Lymphs Abs 1.1 0.7 - 4.0 K/uL   Monocytes Relative 5 %    Monocytes Absolute 0.2 0.1 - 1.0 K/uL   Eosinophils Relative 0 %   Eosinophils Absolute 0.0 0.0 - 0.5 K/uL   Basophils Relative 0 %   Basophils Absolute 0.0 0.0 - 0.1 K/uL   Immature Granulocytes 1 %   Abs Immature Granulocytes 0.03 0.00 - 0.07 K/uL  Sample to Blood Bank  Result Value Ref Range   Blood Bank Specimen SAMPLE AVAILABLE FOR TESTING    Sample Expiration      05/01/2024,2359 Performed at [facility] [address], [address], [place], [place] [address]        RADIOGRAPHIC STUDIES:  I have personally reviewed the radiological images as listed and agree with the findings in the report.  NM PET Image Restag (PS) Skull Base To Thigh EXAM: PET AND CT SKULL BASE TO MID THIGH 03/21/2024 01:07:18 PM  TECHNIQUE: PET imaging was acquired from the base of the skull to the mid thighs. Non-contrast enhanced computed tomography was obtained for attenuation correction and anatomic localization.  RADIOPHARMACEUTICAL: 8.2 mCi F-18 FDG Uptake time 60 minutes. Glucose level 103 mg/dl.  COMPARISON: PET of 12/28/2023.  CLINICAL HISTORY: Restaging stage IV adenocarcinoma of gallbladder.  FINDINGS:  HEAD AND NECK: No cervical nodal hypermetabolism. No cervical adenopathy. Left carotid atherosclerosis.  CHEST: No thoracic nodal hypermetabolism. Index right upper lobe pulmonary nodule measures 7 mm and SUV 2.0 on image 23/7 versus 8 mm and suv 3.7 previously. SABRA An index superior segment left lower lobe nodule  on the prior exam that measured 6 mm and SUV 3.3 has essentially resolved. No residual hypermetabolism in this area. Innumerable smaller pulmonary nodules are also less distinct today, for example within the right lower lobe at 7 mm in image 46/7 versus 10 mm on the prior exam.  ABDOMEN AND PELVIS: Hypermetabolism along the gallbladder fundus and extending into segment 4b of the liver is less distinct today, for example at SUV 7.1 on image  116/4, similar to SUV 6.8 on the prior exam. Porta hepatis node measures 1.0 cm and SUV 5.4 on image 108/4 compared to 1.2 cm and SUV 5.1 on the prior exam. The abdominal retroperitoneal hypermetabolic nodes have resolved. Anterior inferior omental hypermetabolic implant measures 2.7 x 1.7 cm and SUV 4.9 on image 153/4 compared to 2.9 x 1.6 cm and SUV 5.3 on the prior exam. Smaller more cephalad abdominal omental implants are similar. Left pelvic/adnexal implant measures 4.8 x 2.6 cm and SUV 5.6 on image 163/4 versus 4.2 x 3.0 cm and SUV 17.1 on the prior exam. Diffuse colonic hypermetabolism, most significant in the low rectum and anus at SUV 14.9, no CT correlate. Normal adrenal glands. Abdominal aortic atherosclerosis. Hysterectomy. Pelvic floor laxity.  BONES AND SOFT TISSUE: No abnormal FDG activity localizes to the bones. No metabolically active aggressive osseous lesion. Thoracolumbar spine fixation.  VASCULATURE: Right port-a-cath tip mid right atrium. Aortic and coronary artery calcifications.  IMPRESSION: 1. Minimal response to therapy, as evidenced by decreased size and hypermetabolism of Pulmonary Metastasis, gallbladder primary and abdominal nodal metastasis. Omental and left pelvic peritoneal/adnexal implants are relatively similar. 2.  No new sites of disease. 3. Anorectal hypermetabolism is most likely physiologic 4. Incidental findings, including: Aortic atherosclerosis (icd10-i70.0). Coronary artery atherosclerosis.  Electronically signed by: Rockey Kilts MD 03/21/2024 01:33 PM EDT RP Workstation: [id number]     CODE STATUS:  Code Status History     Date Active Date Inactive Code Status Order ID Comments User Context   12/14/2023 1601 12/16/2023 2004 Limited: Do not attempt resuscitation (DNR) -DNR-LIMITED -Do Not Intubate/DNI  [id number]  Caleen Burgess BROCKS, MD ED   09/17/2022 1236 09/18/2022 1519 Full Code [id number]  Onetha Kuba, MD Inpatient   02/27/2021 1602 03/09/2021  2325 Full Code [id number]  Onetha Kuba, MD Inpatient   06/27/2020 1110 06/28/2020 0504 Full Code [id number]  Derrill Dawn, MD HOV    Questions for Most Recent Historical Code Status (Order [id number])     Question Answer   If pulseless and not breathing No CPR or chest compressions.   In Pre-Arrest Conditions (Patient Is Breathing and Has A Pulse) Do not intubate. Provide all appropriate non-invasive medical interventions. Avoid ICU transfer unless indicated or required.   Consent: Discussion documented in EHR or advanced directives reviewed                Advance Directive Documentation    Flowsheet Row Most Recent Value  Type of Advance Directive Healthcare Power of Attorney, Living will  Pre-existing out of facility DNR order (yellow form or pink MOST form) --  MOST Form in Place? --    Orders Placed This Encounter  Procedures   Ambulatory referral to Gastroenterology    Referral Priority:   Urgent    Referral Type:   Consultation    Referral Reason:   Specialty Services Required    Referred to Provider:   Charlanne Groom, MD    Number of Visits Requested:   1     Future Appointments  Date  Time Provider Department Center  05/06/2024  8:30 AM [facility] FLUSH [facility] None  05/06/2024  9:00 AM Bjorn Hallas, MD [facility] None  05/12/2024 11:15 AM [facility] FLUSH [facility] None  05/12/2024 11:45 AM Jalynn Waddell, MD [facility] None  05/13/2024  9:30 AM [facility] INFUSION [facility] None  05/13/2024 10:30 AM Ivonne Harlene RAMAN, RD [facility] None  06/01/2024 10:00 AM Woodard Burnard BROCKS, Counselor [facility] None  06/01/2024 11:00 AM [facility] LAB [facility] None     This document was completed utilizing speech recognition software. Grammatical errors, random word insertions, pronoun errors, and incomplete sentences are an occasional consequence of this system due to software limitations, ambient noise, and hardware issues. Any formal questions or  concerns about the content, text or information contained within the body of this dictation should be directly addressed to the provider for clarification.

## 2024-04-28 NOTE — Assessment & Plan Note (Addendum)
 Please review oncology history for additional details and timeline of events.  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 Biopsy from the omentum confirmed adenocarcinoma, originating from the gallbladder.  Immunostains support diagnosis of gallbladder carcinoma.    Previously I discussed diagnosis, staging, prognosis, plan of care, treatment options.  Reviewed NCCN guidelines. Given stage IV disease, all treatment options are palliative in nature and not curative in intent.  This was discussed with patient, her daughter and son-in-law who were accompanying.  They verbalized understanding.    Discussed the aggressive nature of stage IV gallbladder adenocarcinoma and overall poor prognosis.  Life expectancy with treatment is 1 to 1.5 years; without treatment, less than six months.   NGS panel testing on the specimen and also liquid biopsy for mutation analysis showed no actionable mutations.  Staging PET scan on 12/29/2023 showed known gallbladder mass extending into the liver.  Peritoneal carcinomatosis noted.  Numerous lung nodules were identified, small bowel with FDG uptake, concerning for metastatic disease in the lungs.  Metastatic lymphadenopathy was noted in the abdomen and retroperitoneum.  This scan will serve as a good baseline for future compression.  Discussed results with the patient and family members previously.   Plan made to proceed with palliative systemic chemoimmunotherapy with gemcitabine , cisplatin  and durvalumab .  She started this from 01/01/2024.  Following completion of 4 cycles of chemotherapy, restaging PET scan on 03/21/2024 showed overall improvement with improvement in the lesions in the lungs, lymph nodes, and omentum.   Plan was to continue current management for up to 8 cycles.  If cytopenias or other side effects are prohibitive, we will plan for at least 6 cycles.  Patient was tolerating chemotherapy reasonably well up until now.  Over the  last couple of weeks, she has been having progressive skin rash, with concern for immune mediated dermatitis.  She also has odynophagia which is partially relieved with lidocaine  Magic mouthwash.  She also started taking nystatin and Diflucan  as instructed.  Now she has angular cheilitis and worsening skin rash.  Will hold chemotherapy tomorrow (she was due for cycle 6-day 8 tomorrow)  Plan to reevaluate in 1 week to assess her symptoms.  Previously we sent a referral to genetic counselor for genetic testing.

## 2024-04-29 ENCOUNTER — Inpatient Hospital Stay

## 2024-04-30 ENCOUNTER — Other Ambulatory Visit: Payer: Self-pay | Admitting: Oncology

## 2024-05-02 ENCOUNTER — Inpatient Hospital Stay

## 2024-05-02 ENCOUNTER — Encounter: Payer: Self-pay | Admitting: Physician Assistant

## 2024-05-02 ENCOUNTER — Other Ambulatory Visit (INDEPENDENT_AMBULATORY_CARE_PROVIDER_SITE_OTHER)

## 2024-05-02 ENCOUNTER — Telehealth: Payer: Self-pay

## 2024-05-02 ENCOUNTER — Ambulatory Visit: Payer: Self-pay | Admitting: Physician Assistant

## 2024-05-02 ENCOUNTER — Other Ambulatory Visit: Payer: Self-pay

## 2024-05-02 ENCOUNTER — Ambulatory Visit: Admitting: Physician Assistant

## 2024-05-02 ENCOUNTER — Other Ambulatory Visit (HOSPITAL_COMMUNITY): Payer: Self-pay

## 2024-05-02 VITALS — BP 162/74 | HR 66 | Ht 62.0 in | Wt 157.5 lb

## 2024-05-02 DIAGNOSIS — R1310 Dysphagia, unspecified: Secondary | ICD-10-CM | POA: Diagnosis not present

## 2024-05-02 DIAGNOSIS — R1319 Other dysphagia: Secondary | ICD-10-CM | POA: Diagnosis not present

## 2024-05-02 DIAGNOSIS — C221 Intrahepatic bile duct carcinoma: Secondary | ICD-10-CM

## 2024-05-02 LAB — COMPREHENSIVE METABOLIC PANEL WITH GFR
ALT: 50 U/L — ABNORMAL HIGH (ref 0–35)
AST: 44 U/L — ABNORMAL HIGH (ref 0–37)
Albumin: 3.7 g/dL (ref 3.5–5.2)
Alkaline Phosphatase: 160 U/L — ABNORMAL HIGH (ref 39–117)
BUN: 16 mg/dL (ref 6–23)
CO2: 28 meq/L (ref 19–32)
Calcium: 8.7 mg/dL (ref 8.4–10.5)
Chloride: 105 meq/L (ref 96–112)
Creatinine, Ser: 0.88 mg/dL (ref 0.40–1.20)
GFR: 63.31 mL/min
Glucose, Bld: 92 mg/dL (ref 70–99)
Potassium: 3.6 meq/L (ref 3.5–5.1)
Sodium: 143 meq/L (ref 135–145)
Total Bilirubin: 0.6 mg/dL (ref 0.2–1.2)
Total Protein: 5.9 g/dL — ABNORMAL LOW (ref 6.0–8.3)

## 2024-05-02 LAB — CBC WITH DIFFERENTIAL/PLATELET
Basophils Absolute: 0 K/uL (ref 0.0–0.1)
Basophils Relative: 0.3 % (ref 0.0–3.0)
Eosinophils Absolute: 0 K/uL (ref 0.0–0.7)
Eosinophils Relative: 0 % (ref 0.0–5.0)
HCT: 29.6 % — ABNORMAL LOW (ref 36.0–46.0)
Hemoglobin: 10.1 g/dL — ABNORMAL LOW (ref 12.0–15.0)
Lymphocytes Relative: 14.2 % (ref 12.0–46.0)
Lymphs Abs: 1.5 K/uL (ref 0.7–4.0)
MCHC: 34 g/dL (ref 30.0–36.0)
MCV: 93.8 fl (ref 78.0–100.0)
Monocytes Absolute: 1 K/uL (ref 0.1–1.0)
Monocytes Relative: 10 % (ref 3.0–12.0)
Neutro Abs: 7.7 K/uL (ref 1.4–7.7)
Neutrophils Relative %: 75.5 % (ref 43.0–77.0)
Platelets: 87 K/uL — ABNORMAL LOW (ref 150.0–400.0)
RBC: 3.16 Mil/uL — ABNORMAL LOW (ref 3.87–5.11)
RDW: 22.7 % — ABNORMAL HIGH (ref 11.5–15.5)
WBC: 10.3 K/uL (ref 4.0–10.5)

## 2024-05-02 MED ORDER — OMEPRAZOLE 20 MG PO CPDR
20.0000 mg | DELAYED_RELEASE_CAPSULE | Freq: Every day | ORAL | 0 refills | Status: DC
  Filled 2024-05-02: qty 60, 60d supply, fill #0

## 2024-05-02 NOTE — Telephone Encounter (Signed)
 Copied from CRM #[id number]. Topic: Clinical - Prescription Issue >> May 02, 2024  9:36 AM [name] wrote: Reason for CRM: Patient states the pharmacy advised that they did not get a prescription for omeprazole  (PRILOSEC) 20 MG capsule. Is requesting it to be resent to a different pharmacy: Janice Pace - [facility] [address] [place] [address] Phone: [phone] Fax: [phone]  Patient can be reached at [phone]

## 2024-05-02 NOTE — Patient Instructions (Signed)
 Your provider has requested that you go to the basement level for lab work before leaving today. Press B on the elevator. The lab is located at the first door on the left as you exit the elevator.  You have been scheduled for an endoscopy. Please follow written instructions given to you at your visit today.  If you use inhalers (even only as needed), please bring them with you on the day of your procedure.  If you take any of the following medications, they will need to be adjusted prior to your procedure:   DO NOT TAKE 7 DAYS PRIOR TO TEST- Trulicity (dulaglutide) Ozempic, Wegovy (semaglutide) Mounjaro (tirzepatide) Bydureon Bcise (exanatide extended release)  DO NOT TAKE 1 DAY PRIOR TO YOUR TEST Rybelsus (semaglutide) Adlyxin (lixisenatide) Victoza (liraglutide) Byetta (exanatide) ___________________________________________________________________________   Due to recent changes in healthcare laws, you may see the results of your imaging and laboratory studies on MyChart before your provider has had a chance to review them.  We understand that in some cases there may be results that are confusing or concerning to you. Not all laboratory results come back in the same time frame and the provider may be waiting for multiple results in order to interpret others.  Please give us  48 hours in order for your provider to thoroughly review all the results before contacting the office for clarification of your results.   _______________________________________________________  If your blood pressure at your visit was 140/90 or greater, please contact your primary care physician to follow up on this.  _______________________________________________________  If you are age 77 or older, your body mass index should be between 23-30. Your Body mass index is 28.81 kg/m. If this is out of the aforementioned range listed, please consider follow up with your Primary Care Provider.  If you are age 46  or younger, your body mass index should be between 19-25. Your Body mass index is 28.81 kg/m. If this is out of the aformentioned range listed, please consider follow up with your Primary Care Provider.   ________________________________________________________  The [facility] providers would like to encourage you to use MYCHART to communicate with providers for non-urgent requests or questions.  Due to long hold times on the telephone, sending your provider a message by [facility] may be a faster and more efficient way to get a response.  Please allow 48 business hours for a response.  Please remember that this is for non-urgent requests.  _______________________________________________________  [facility] is using a team-based approach to care.  Your team is made up of your doctor and two to three APPS. Our APPS (Nurse Practitioners and Physician Assistants) work with your physician to ensure care continuity for you. They are fully qualified to address your health concerns and develop a treatment plan. They communicate directly with your gastroenterologist to care for you. Seeing the Advanced Practice Practitioners on your physician's team can help you by facilitating care more promptly, often allowing for earlier appointments, access to diagnostic testing, procedures, and other specialty referrals.   Thank you for choosing me and [facility].  Jennifer Lemmon,PA-C

## 2024-05-02 NOTE — Telephone Encounter (Signed)
 Rx sent to [facility]

## 2024-05-02 NOTE — Progress Notes (Signed)
 Janice Pace, please let the patient know that her potassium and hemoglobin looked good today.  Otherwise her labs are stable given her ongoing chemotherapy for carcinoma of the gallbladder.  We can proceed with EGD as scheduled on Wednesday with Dr. Avram.  FYI Dr. Avram Delon Failing, PA-C

## 2024-05-02 NOTE — Progress Notes (Signed)
 Chief Complaint: Dysphagia  HPI:    Janice Pace is a 78 year old Caucasian female known to Dr. Avram, with a past medical history as listed below including stage IV adenocarcinoma of the gallbladder, COPD/asthma, GERD, depression, anxiety, hiatal hernia, echocardiogram 02/16/2023 for dyspnea with LVEF 70-75%, constipation and multiple others, who was referred to me by Gayle Saddie FALCON, PA-C for a complaint of dysphagia.      11/26/2022 colonoscopy with Dr. Avram with perianal skin tags, 8 diminutive polyps in the sigmoid colon, descending colon, transverse colon and ascending colon.  Anal papula a hypertrophied.  Pathology showed adenomatous polyps.  Recall placed for office visit in 2027 to discuss repeat colonoscopy.    03/21/2024 PET scan for restaging of stage IV adenocarcinoma of the gallbladder.  This showed minimal response to therapy, as evidenced by decreased size of hypermetabolism of pulmonary metastasis, gallbladder primary and abdominal nodal metastasis, omental and left pelvic peritoneal/adnexal implants were relatively similar.  Anorectal hypermetabolism is likely physiologic.    04/28/2024 hemoglobin 8.9 (7.6 on 04/21/2024), MCV normal.  Platelet count low at 94.  CMP with a potassium of 3.4, ALT elevated 46, alk phos 144.    04/28/2024 patient followed with oncology and at that time discussed with patient was tolerating chemotherapy reasonably well until the past couple of weeks with progressive skin rash and odynophagia partially raided lidocaine  Magic mouthwash.  She also started taking Nystatin and Diflucan  as instructed.  Patient referred to us  for swallowing difficulties.  Continued on Fluconazole  for antifungal treatment.  At that time discussed that odynophagia and dysphagia were likely related to oral mucositis and possible esophageal involvement.    Today, the patient tells me that she is actually feeling a little bit better since being started on Fluconazole  and Magic mouthwash.   Prior to that she was having esophageal pain that felt sharp whenever she would swallow and also had trouble taking pills feeling like they would get hung up.  She has not had this happen over the past 3 to 4 days.  Associated symptoms include some generalized abdominal pain worse in the epigastrium.    Denies fever or chills.  Past Medical History:  Diagnosis Date   Arthritis    Asthma    Bronchitis    Cancer (HCC)    dermatosivros arcoma and protuverans   Carpal tunnel syndrome, left upper limb 11/06/2017   Chronic back pain    degenerative disc disease   Constipation    stool softener daily   Constipation    COPD (chronic obstructive pulmonary disease) (HCC)    GERD (gastroesophageal reflux disease)    takes Prilosec daily   GERD (gastroesophageal reflux disease)    Heart murmur    Heart valve disorder    Hemorrhoids    History of hiatal hernia    Ileus (HCC) 03/02/2021   Insomnia    d/t chantix    Joint pain    Nocturia    Ovarian failure 05/28/2022   Personal history of colonic adenoma 03/13/2003   03/13/2003 - 5 mm adenoma   Pre-diabetes    Prediabetes    White coat syndrome without diagnosis of hypertension     Past Surgical History:  Procedure Laterality Date   ABDOMINAL HYSTERECTOMY     ANTERIOR LAT LUMBAR FUSION N/A 02/27/2021   Procedure: Anterior lateral interbody fusion - lateral two - lateral three - lateral three - lateral four with exploration fusion L4-S1;  Surgeon: Onetha Kuba, MD;  Location: [facility] OR;  Service:  Neurosurgery;  Laterality: N/A;   BACK SURGERY  1988   BACK SURGERY  09/03/2011   rods,screws x 8    BACK SURGERY     09/2022   BUNIONECTOMY     bilateral   BUNIONECTOMY     bil feet   COLONOSCOPY     dermatosibrosarcoma protuberans     IR IMAGING GUIDED PORT INSERTION  01/07/2024   LAMINECTOMY WITH POSTERIOR LATERAL ARTHRODESIS LEVEL 3 N/A 02/27/2021   Procedure: Posterior augmentation with pedicle screws Latreal one - lateral four - Posterior  Lateral and Interbody fusion;  Surgeon: Onetha Kuba, MD;  Location: [facility];  Service: Neurosurgery;  Laterality: N/A;   PARTIAL HYSTERECTOMY     RECTOCELE REPAIR     Rectum Repair     RESECTION TUMOR WRIST RADICAL     x 2 rt   TUBAL LIGATION  1974   TUBAL LIGATION     WRIST SURGERY     left    Current Outpatient Medications  Medication Sig Dispense Refill   albuterol  (VENTOLIN  HFA) 108 (90 Base) MCG/ACT inhaler Inhale 1-2 puffs into the lungs every 6 (six) hours as needed for wheezing or shortness of breath. 8 g 6   aspirin  81 MG EC tablet Take 81 mg by mouth daily.     budesonide -glycopyrrolate -formoterol  (BREZTRI  AEROSPHERE) 160-9-4.8 MCG/ACT AERO inhaler Inhale 2 puffs into the lungs in the morning and at bedtime. 10.7 g 11   dexamethasone  (DECADRON ) 4 MG tablet Take 2 tablets (8 mg) by mouth daily x 3 days starting the day after cisplatin  chemotherapy. Take with food. 30 tablet 1   docusate sodium  (COLACE) 100 MG capsule Take 500 mg by mouth at bedtime.     fluconazole  (DIFLUCAN ) 200 MG tablet Take 1 tablet (200 mg total) by mouth daily. 14 tablet 0   lidocaine -prilocaine  (EMLA ) cream Apply to affected area once 30 g 3   magic mouthwash (nystatin, lidocaine , diphenhydrAMINE , alum & mag hydroxide) suspension Take 5 mLs by mouth every 4 (four) hours as needed for mouth pain. Suspension contains equal amounts of Maalox, nystatin, diphenhydramine  and lidocaine . 1:1:1:1 ratio. 420 mL 1   magnesium  oxide (MAG-OX) 400 (240 Mg) MG tablet Take 1 tablet (400 mg total) by mouth 2 (two) times daily. 60 tablet 1   megestrol  (MEGACE  ES) 625 MG/5ML suspension Take 5 mLs (625 mg total) by mouth daily. 150 mL 3   methylPREDNISolone  (MEDROL  DOSEPAK) 4 MG TBPK tablet Take as directed on package instructions 21 tablet 1   metoprolol  succinate (TOPROL -XL) 50 MG 24 hr tablet TAKE 1 TABLET BY MOUTH ONCE DAILY. TAKE WITH OR IMMEDIATELY FOLLOWING A MEAL 90 tablet 1   morphine  (MS CONTIN ) 15 MG 12 hr tablet Take 1  tablet (15 mg total) by mouth every 12 (twelve) hours. 60 tablet 0   nystatin (MYCOSTATIN) 100000 UNIT/ML suspension Take 5 mLs by mouth 4 (four) times daily. (Patient not taking: Reported on 04/28/2024)     omeprazole  (PRILOSEC) 20 MG capsule TAKE 1 CAPSULE BY MOUTH TWICE DAILY BEFORE A MEAL 60 capsule 0   ondansetron  (ZOFRAN ) 4 MG tablet Take 1 tablet (4 mg total) by mouth every 6 (six) hours as needed for nausea. 30 tablet 0   ondansetron  (ZOFRAN ) 8 MG tablet Take 1 tablet (8 mg total) by mouth every 8 (eight) hours as needed for nausea or vomiting. Start on the third day after cisplatin . 30 tablet 1   oxyCODONE  10 MG TABS Take 1-1.5 tablets (10-15 mg total)  by mouth every 4 (four) hours as needed for moderate pain (pain score 4-6) or severe pain (pain score 7-10). (Patient taking differently: Take 10-15 mg by mouth every 4 (four) hours as needed for moderate pain (pain score 4-6) or severe pain (pain score 7-10). Pt reports she takes this once in awhile) 60 tablet 0   polyethylene glycol (MIRALAX  / GLYCOLAX ) 17 g packet Take 17 g by mouth 2 (two) times daily.     prochlorperazine  (COMPAZINE ) 10 MG tablet Take 1 tablet (10 mg total) by mouth every 6 (six) hours as needed (Nausea or vomiting). 30 tablet 1   rosuvastatin  (CRESTOR ) 10 MG tablet Take 1 tablet by mouth once daily 90 tablet 0   spironolactone  (ALDACTONE ) 25 MG tablet Take 25 mg by mouth daily.     No current facility-administered medications for this visit.    Allergies as of 05/02/2024 - Review Complete 04/28/2024  Allergen Reaction Noted   Sulfa antibiotics Hives and Itching 10/03/2020   Forteo  [parathyroid hormone (recomb)] Other (See Comments) 12/14/2023   Atorvastatin Other (See Comments) 10/03/2020   Metformin hcl Other (See Comments) 10/03/2020    Family History  Problem Relation Age of Onset   Leukemia Mother    Anesthesia problems Neg Hx    Hypotension Neg Hx    Malignant hyperthermia Neg Hx    Pseudochol deficiency  Neg Hx    Colon cancer Neg Hx    Colon polyps Neg Hx    Esophageal cancer Neg Hx    Stomach cancer Neg Hx    Rectal cancer Neg Hx     Social History   Socioeconomic History   Marital status: Married    Spouse name: david   Number of children: 3   Years of education: Not on file   Highest education level: GED or equivalent  Occupational History   Occupation: retired  Tobacco Use   Smoking status: Former    Current packs/day: 0.00    Average packs/day: 1 pack/day for 50.0 years (50.0 ttl pk-yrs)    Types: Cigarettes    Start date: 08/16/1961    Quit date: 08/17/2011    Years since quitting: 12.7    Passive exposure: Never   Smokeless tobacco: Never  Vaping Use   Vaping status: Never Used  Substance and Sexual Activity   Alcohol use: No    Comment: occasional wine   Drug use: No   Sexual activity: Yes    Birth control/protection: Surgical, Other-see comments  Other Topics Concern   Not on file  Social History Narrative   ** Merged History Encounter **       Social Drivers of Health   Financial Resource Strain: Low Risk  (08/25/2023)   Overall Financial Resource Strain (CARDIA)    Difficulty of Paying Living Expenses: Not hard at all  Food Insecurity: No Food Insecurity (12/24/2023)   Hunger Vital Sign    Worried About Running Out of Food in the Last Year: Never true    Ran Out of Food in the Last Year: Never true  Transportation Needs: No Transportation Needs (12/24/2023)   PRAPARE - Administrator, Civil Service (Medical): No    Lack of Transportation (Non-Medical): No  Physical Activity: Inactive (08/25/2023)   Exercise Vital Sign    Days of Exercise per Week: 0 days    Minutes of Exercise per Session: 0 min  Stress: No Stress Concern Present (08/25/2023)   [employer] of Occupational Health - Occupational Stress  Questionnaire    Feeling of Stress : Not at all  Social Connections: Socially Integrated (12/14/2023)   Social Connection and Isolation  Panel    Frequency of Communication with Friends and Family: More than three times a week    Frequency of Social Gatherings with Friends and Family: Once a week    Attends Religious Services: More than 4 times per year    Active Member of [employer] or Organizations: Yes    Attends Engineer, structural: More than 4 times per year    Marital Status: Married  Catering manager Violence: Not At Risk (12/24/2023)   Humiliation, Afraid, Rape, and Kick questionnaire    Fear of Current or Ex-Partner: No    Emotionally Abused: No    Physically Abused: No    Sexually Abused: No    Review of Systems:    Constitutional: No fever or chills Cardiovascular: No chest pain Respiratory: No SOB  Gastrointestinal: See HPI and otherwise negative   Physical Exam:  Vital signs: BP (!) 162/74   Pulse 66   Ht 5' 2 (1.575 m)   Wt 157 lb 8 oz (71.4 kg)   SpO2 98%   BMI 28.81 kg/m    Constitutional:   Pleasant chronically ill appearing, elderly Caucasian female appears to be in NAD, Well developed, Well nourished, alert and cooperative Head:  Normocephalic and atraumatic. Eyes:   PEERL, EOMI. No icterus. Conjunctiva pink. Ears:  Normal auditory acuity. Neck:  Supple Throat: Oral cavity and pharynx without inflammation, swelling or lesion.  Respiratory: Respirations even and unlabored. Lungs clear to auscultation bilaterally.   No wheezes, crackles, or rhonchi.  Cardiovascular: Normal S1, S2. No MRG. Regular rate and rhythm. No peripheral edema, cyanosis or pallor.  Gastrointestinal:  Soft, nondistended, moderate/marked epigastric and RUQ ttp. No rebound or guarding. Normal bowel sounds. No appreciable masses or hepatomegaly. Rectal:  Not performed.  Msk:  Symmetrical without gross deformities. Without edema, no deformity or joint abnormality.  Neurologic:  Alert and  oriented x4;  grossly normal neurologically.  Skin:   +rash over extensor surface of arms Psychiatric: Demonstrates good judgement  and reason without abnormal affect or behaviors.  RELEVANT LABS AND IMAGING: CBC    Component Value Date/Time   WBC 5.0 04/28/2024 0912   WBC 19.9 (H) 03/05/2024 1116   RBC 2.79 (L) 04/28/2024 0912   HGB 8.9 (L) 04/28/2024 0912   HGB 15.2 12/22/2023 0938   HCT 25.3 (L) 04/28/2024 0912   HCT 45.8 12/22/2023 0938   PLT 94 (L) 04/28/2024 0912   PLT 292 12/22/2023 0938   MCV 90.7 04/28/2024 0912   MCV 89 12/22/2023 0938   MCH 31.9 04/28/2024 0912   MCHC 35.2 04/28/2024 0912   RDW 18.6 (H) 04/28/2024 0912   RDW 13.6 12/22/2023 0938   LYMPHSABS 1.1 04/28/2024 0912   LYMPHSABS 1.1 12/22/2023 0938   MONOABS 0.2 04/28/2024 0912   EOSABS 0.0 04/28/2024 0912   EOSABS 0.0 12/22/2023 0938   BASOSABS 0.0 04/28/2024 0912   BASOSABS 0.0 12/22/2023 0938    CMP     Component Value Date/Time   NA 139 04/28/2024 0912   NA 140 12/22/2023 0938   K 3.4 (L) 04/28/2024 0912   CL 107 04/28/2024 0912   CO2 26 04/28/2024 0912   GLUCOSE 128 (H) 04/28/2024 0912   BUN 16 04/28/2024 0912   BUN 10 12/22/2023 0938   CREATININE 0.90 04/28/2024 0912   CALCIUM  8.7 (L) 04/28/2024 0912   PROT  5.6 (L) 04/28/2024 0912   PROT 7.2 12/22/2023 0938   ALBUMIN  3.3 (L) 04/28/2024 0912   ALBUMIN  4.7 12/22/2023 0938   AST 35 04/28/2024 0912   ALT 46 (H) 04/28/2024 0912   ALKPHOS 144 (H) 04/28/2024 0912   BILITOT 0.8 04/28/2024 0912   GFRNONAA >60 04/28/2024 0912   GFRAA 100 04/24/2020 1600    Assessment: 1.  Dysphagia/odynophagia: Developed over the past 3 to 4 weeks, thought related to chemotherapy and oral mucositis and candidiasis, on Fluconazole  and Magic mouthwash from Oncology which has helped over the past 3 to 4 days; consider Candida esophagitis most likely 2.  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  Anemia and hypokalemia:  Likely related to chemotherapy, will recheck today prior to time of procedure  Plan: 1.  Continue Prilosec 20 mg twice daily 2.  Continue Fluconazole  and Magic mouthwash 3.  Patient is already scheduled for an EGD on Wednesday, October 22 at 10:30 AM with Dr. Avram in the [facility].  Did provide the patient with a detailed list of risks for the procedure and she agrees to proceed. Patient is appropriate for endoscopic procedure(s) in the ambulatory ([facility]) setting. 4.  Repeat CBC and CMP today given some hypokalemia and anemia at last check 5.  Patient to follow in clinic per recommendations after time of procedure.  Delon Failing, PA-C [facility] 05/02/2024, 9:49 AM  Cc: Gayle Saddie FALCON, PA-C

## 2024-05-03 ENCOUNTER — Ambulatory Visit: Payer: Self-pay

## 2024-05-03 NOTE — Telephone Encounter (Signed)
 Please contact pt if provider would like to add or adjust meds prior to visit. Note: states had reaction to chemo and they are doing a f/u endo tomorrow. Sees onco on Thursdays and has chemo tx on Friday.  FYI Only or Action Required?: Action required by provider: update on patient condition.  Patient was last seen in primary care on 12/22/2023 by Gayle Saddie FALCON, PA-C.  Called Nurse Triage reporting Hypertension.  Symptoms began unable to specify, does not check BP on a regular basis.  Interventions attempted: Prescription medications: metoprolol .  Symptoms are: unchanged.  Triage Disposition: See PCP Within 2 Weeks  Patient/caregiver understands and will follow disposition?:   Reason for Disposition  [1] Systolic BP >= 130 OR Diastolic >= 80 AND [2] taking BP medications  Answer Assessment - Initial Assessment Questions Pt states BP is elevated at medical visits, runs lower at home. States baseline at home is 140-150 systolic. Is asymptomatic. Endorces metoprolol  compliance.  Patient to to monitor BP at home daily and log for her appt on Monday. Patient to call if BP goes higher or if she develops symptoms.  1. BLOOD PRESSURE: What is your blood pressure? Did you take at least two measurements 5 minutes apart?     140-150 systolic  2. ONSET: When did you take your blood pressure?     Irregularly, not taking it daily  3. HOW: How did you take your blood pressure? (e.g., automatic home BP monitor, visiting nurse)     Machine  4. HISTORY: Do you have a history of high blood pressure?     Yes, taking metoprolol   5. MEDICINES: Are you taking any medicines for blood pressure? Have you missed any doses recently?     Metoprolol , states is compliant  6. OTHER SYMPTOMS: Do you have any symptoms? (e.g., blurred vision, chest pain, difficulty breathing, headache, weakness)     Denies  Protocols used: Blood Pressure - High-A-AH

## 2024-05-04 ENCOUNTER — Other Ambulatory Visit (HOSPITAL_COMMUNITY): Payer: Self-pay

## 2024-05-04 ENCOUNTER — Encounter: Admitting: Internal Medicine

## 2024-05-04 ENCOUNTER — Telehealth: Payer: Self-pay | Admitting: Internal Medicine

## 2024-05-04 ENCOUNTER — Other Ambulatory Visit: Payer: Self-pay

## 2024-05-04 ENCOUNTER — Encounter: Payer: Self-pay | Admitting: Oncology

## 2024-05-04 DIAGNOSIS — D72829 Elevated white blood cell count, unspecified: Secondary | ICD-10-CM

## 2024-05-04 DIAGNOSIS — T451X5A Adverse effect of antineoplastic and immunosuppressive drugs, initial encounter: Secondary | ICD-10-CM

## 2024-05-04 DIAGNOSIS — E876 Hypokalemia: Secondary | ICD-10-CM

## 2024-05-04 DIAGNOSIS — D701 Agranulocytosis secondary to cancer chemotherapy: Secondary | ICD-10-CM

## 2024-05-04 DIAGNOSIS — C23 Malignant neoplasm of gallbladder: Secondary | ICD-10-CM

## 2024-05-04 MED ORDER — MAGNESIUM OXIDE -MG SUPPLEMENT 400 (240 MG) MG PO TABS
400.0000 mg | ORAL_TABLET | Freq: Two times a day (BID) | ORAL | 1 refills | Status: DC
  Filled 2024-05-04: qty 60, 30d supply, fill #0

## 2024-05-04 NOTE — Telephone Encounter (Signed)
OK No charge 

## 2024-05-04 NOTE — Telephone Encounter (Signed)
 Inbound call from patient spouse cancelling patient procedure for today due to patient being sick.   Will call back to reschedule.

## 2024-05-04 NOTE — Telephone Encounter (Signed)
 I put her with you and told them 9:00am

## 2024-05-05 ENCOUNTER — Telehealth: Payer: Self-pay

## 2024-05-05 NOTE — Telephone Encounter (Signed)
 Spoke with patient in re guards to [employer] trying to reach out to her the [facility]. Is on hold until further noticed, when provider say to resume Patient states

## 2024-05-06 ENCOUNTER — Other Ambulatory Visit: Payer: Self-pay | Admitting: Cardiology

## 2024-05-06 ENCOUNTER — Other Ambulatory Visit (HOSPITAL_COMMUNITY): Payer: Self-pay

## 2024-05-06 ENCOUNTER — Inpatient Hospital Stay: Admitting: Oncology

## 2024-05-06 ENCOUNTER — Encounter: Payer: Self-pay | Admitting: Oncology

## 2024-05-06 ENCOUNTER — Inpatient Hospital Stay

## 2024-05-06 VITALS — BP 138/70 | HR 75 | Temp 98.0°F | Resp 17 | Ht 62.0 in | Wt 158.9 lb

## 2024-05-06 DIAGNOSIS — D72829 Elevated white blood cell count, unspecified: Secondary | ICD-10-CM

## 2024-05-06 DIAGNOSIS — G893 Neoplasm related pain (acute) (chronic): Secondary | ICD-10-CM

## 2024-05-06 DIAGNOSIS — C23 Malignant neoplasm of gallbladder: Secondary | ICD-10-CM

## 2024-05-06 DIAGNOSIS — Z5112 Encounter for antineoplastic immunotherapy: Secondary | ICD-10-CM | POA: Diagnosis not present

## 2024-05-06 DIAGNOSIS — D701 Agranulocytosis secondary to cancer chemotherapy: Secondary | ICD-10-CM

## 2024-05-06 DIAGNOSIS — T451X5A Adverse effect of antineoplastic and immunosuppressive drugs, initial encounter: Secondary | ICD-10-CM

## 2024-05-06 DIAGNOSIS — I10 Essential (primary) hypertension: Secondary | ICD-10-CM

## 2024-05-06 DIAGNOSIS — E876 Hypokalemia: Secondary | ICD-10-CM

## 2024-05-06 LAB — CBC WITH DIFFERENTIAL (CANCER CENTER ONLY)
Abs Immature Granulocytes: 0.17 K/uL — ABNORMAL HIGH (ref 0.00–0.07)
Basophils Absolute: 0 K/uL (ref 0.0–0.1)
Basophils Relative: 0 %
Eosinophils Absolute: 0 K/uL (ref 0.0–0.5)
Eosinophils Relative: 1 %
HCT: 25.4 % — ABNORMAL LOW (ref 36.0–46.0)
Hemoglobin: 8.6 g/dL — ABNORMAL LOW (ref 12.0–15.0)
Immature Granulocytes: 4 %
Lymphocytes Relative: 21 %
Lymphs Abs: 0.9 K/uL (ref 0.7–4.0)
MCH: 32.5 pg (ref 26.0–34.0)
MCHC: 33.9 g/dL (ref 30.0–36.0)
MCV: 95.8 fL (ref 80.0–100.0)
Monocytes Absolute: 0.9 K/uL (ref 0.1–1.0)
Monocytes Relative: 19 %
Neutro Abs: 2.4 K/uL (ref 1.7–7.7)
Neutrophils Relative %: 55 %
Platelet Count: 126 K/uL — ABNORMAL LOW (ref 150–400)
RBC: 2.65 MIL/uL — ABNORMAL LOW (ref 3.87–5.11)
RDW: 21.3 % — ABNORMAL HIGH (ref 11.5–15.5)
WBC Count: 4.4 K/uL (ref 4.0–10.5)
nRBC: 0 % (ref 0.0–0.2)

## 2024-05-06 LAB — CMP (CANCER CENTER ONLY)
ALT: 38 U/L (ref 0–44)
AST: 34 U/L (ref 15–41)
Albumin: 3 g/dL — ABNORMAL LOW (ref 3.5–5.0)
Alkaline Phosphatase: 117 U/L (ref 38–126)
Anion gap: 7 (ref 5–15)
BUN: 20 mg/dL (ref 8–23)
CO2: 27 mmol/L (ref 22–32)
Calcium: 8.5 mg/dL — ABNORMAL LOW (ref 8.9–10.3)
Chloride: 108 mmol/L (ref 98–111)
Creatinine: 1.03 mg/dL — ABNORMAL HIGH (ref 0.44–1.00)
GFR, Estimated: 56 mL/min — ABNORMAL LOW (ref >60)
Glucose, Bld: 155 mg/dL — ABNORMAL HIGH (ref 70–99)
Potassium: 3.3 mmol/L — ABNORMAL LOW (ref 3.5–5.1)
Sodium: 142 mmol/L (ref 135–145)
Total Bilirubin: 0.5 mg/dL (ref 0.0–1.2)
Total Protein: 5.2 g/dL — ABNORMAL LOW (ref 6.5–8.1)

## 2024-05-06 LAB — SAMPLE TO BLOOD BANK

## 2024-05-06 LAB — MAGNESIUM: Magnesium: 1.9 mg/dL (ref 1.7–2.4)

## 2024-05-06 MED ORDER — MORPHINE SULFATE ER 15 MG PO TBCR
15.0000 mg | EXTENDED_RELEASE_TABLET | Freq: Two times a day (BID) | ORAL | 0 refills | Status: DC
  Filled 2024-05-06: qty 60, 30d supply, fill #0

## 2024-05-06 NOTE — Progress Notes (Signed)
 [facility]  ONCOLOGY CLINIC PROGRESS NOTE   Patient Care Team: [facility] as PCP - General (Physician Assistant) Pcp, No Janice Frieze, DO (Family Medicine) Pa, [facility] [name], RN as Oncology Nurse Navigator  PATIENT NAME: Janice Pace   MR#: [id number] DOB: 1945-09-22  Date of visit: 05/06/2024   ASSESSMENT & PLAN:   [name] is a 78 y.o. 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   Adenocarcinoma of gallbladder [facility]) Please review oncology history for additional details and timeline of events.  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 Biopsy from the omentum confirmed adenocarcinoma, originating from the gallbladder.  Immunostains support diagnosis of gallbladder carcinoma.    Previously I discussed diagnosis, staging, prognosis, plan of care, treatment options.  Reviewed NCCN guidelines. Given stage IV disease, all treatment options are palliative in nature and not curative in intent.  This was discussed with patient, her daughter and son-in-law who were accompanying.  They verbalized understanding.    Discussed the aggressive nature of stage IV gallbladder adenocarcinoma and overall poor prognosis.  Life expectancy with treatment is 1 to 1.5 years; without treatment, less than six months.   NGS panel testing on the specimen and also liquid biopsy for mutation analysis showed no actionable mutations.  Staging PET scan on 12/29/2023 showed known gallbladder mass extending into the liver.  Peritoneal carcinomatosis noted.  Numerous lung nodules were identified, small bowel with FDG uptake, concerning for metastatic disease in the lungs.   Metastatic lymphadenopathy was noted in the abdomen and retroperitoneum.  This scan will serve as a good baseline for future compression.  Discussed results with the patient and family members previously.   Plan made to proceed with palliative systemic chemoimmunotherapy with gemcitabine , cisplatin  and durvalumab .  She started this from 01/01/2024.  Following completion of 4 cycles of chemotherapy, restaging PET scan on 03/21/2024 showed overall improvement with improvement in the lesions in the lungs, lymph nodes, and omentum.   Plan was to continue current management for up to 8 cycles.  If cytopenias or other side effects are prohibitive, we will plan for at least 6 cycles.  Patient was tolerating chemotherapy reasonably well up until now.  Over the last 3 weeks, she has been having progressive skin rash, with concern for immune mediated dermatitis.  She also has had odynophagia which was felt to be from Candida esophagitis.  We arranged for EGD but patient could not go to the appointment because of severe diarrhea after previous episodes of constipation.  No odynophagia has resolved with continued use of nystatin mouthwash and Diflucan .  Skin rash seems to be improving as well.  Plan to reevaluate in 1 week to assess her symptoms.  If continued improvement is noted, plan to resume systemic treatments.  Previously we sent a referral to genetic counselor for genetic testing.  Assessment and Plan Assessment & Plan  Cutaneous rash Rash on chest and face is drying up and improving. Previously treated with Medrol  dose pack. Currently using over-the-counter aloe vera gel for symptomatic relief. - Continue using aloe vera gel for symptomatic relief  Oropharyngeal candidiasis Resolved with antifungal treatment and mouthwash. No current pain or difficulty swallowing. Decision made to hold off on endoscopic procedure  as symptoms have improved. - Hold off on endoscopic procedure unless symptoms  recur  Chronic pain Managed with long-acting morphine . She takes oxycodone  as needed. Prescription for morphine  has run out. - Send prescription for long-acting morphine  to community pharmacy  Hypokalemia Potassium levels are low. - Increase dietary intake of potassium-rich foods like bananas and orange juice  Constipation and diarrhea secondary to laxative use Resolved after overuse of Miralax .    I reviewed lab results and outside records for this visit and discussed relevant results with the patient. Diagnosis, plan of care and treatment options were also discussed in detail with the patient. Opportunity provided to ask questions and answers provided to her apparent satisfaction. Provided instructions to call our clinic with any problems, questions or concerns prior to return visit. I recommended to continue follow-up with PCP and sub-specialists. She verbalized understanding and agreed with the plan.   NCCN guidelines have been consulted in the planning of this patient's care.  I spent a total of 42 minutes during this encounter with the patient including review of chart and various tests results, discussions about plan of care and coordination of care plan.  Janice Patten, MD  05/06/2024 6:51 PM  [facility] - A DEPT OF [facility] [address] [place] [address] Dept: [phone] Dept Fax: [phone]    CHIEF COMPLAINT/ REASON FOR VISIT:   Stage IV gallbladder adenocarcinoma with peritoneal mets and lung mets.  Current Treatment: Palliative systemic chemoimmunotherapy with cisplatin , gemcitabine , durvalumab  started from 01/01/2024.  INTERVAL HISTORY:    Discussed the use of AI scribe software for clinical note transcription with the patient, who gave verbal consent to proceed.  History of Present Illness  Janice [facility]ace is a 78 year old female who presents for follow-up on swallowing difficulties  and skin rash.  Swallowing difficulties have significantly improved. She previously experienced odynophagia, which has now resolved. She reports that the prescribed mouthwash has helped a lot. She was unable to attend a scheduled appointment with the gastroenterology department due to illness but plans to reschedule.  She experienced constipation for about a week, leading her to take Miralax . However, she took an excessive amount, resulting in diarrhea that lasted from Sunday to Wednesday. The diarrhea has since resolved.  She has a history of a skin rash, particularly severe on her chest, which is now drying up and improving. She missed a dermatology appointment earlier in the week due to illness and plans to reschedule. She is currently taking Benadryl  for the rash and has completed a course of Solu-Medrol . She uses over-the-counter aloe vera gel to help with the rash on her hands.  She has a prescription for long-acting morphine  for pain management, which she takes as needed. She also has oxycodone , which she uses as needed, though infrequently. No current pain when swallowing and no new symptoms reported.    I have reviewed the past medical history, past surgical history, social history and family history with the patient and they are unchanged from previous note.  HISTORY OF PRESENT ILLNESS:   ONCOLOGY HISTORY:   78 y.o. lady with past medical history of essential hypertension, osteoarthritis, COPD/asthma, GERD, depression, anxiety, hiatal hernia, chronic back pain presented to the ED on 12/14/2023 with complaints of worsening epigastric/abdominal pain over 2-3 weeks.  Routine blood work was unremarkable in the ER.  However CT abdomen and pelvis showed irregular appearance of the gallbladder with suggestion of  capsular thickening along the fundus of the gallbladder.  Gallbladder appeared inseparable from the adjacent portion of the liver.  Clinical picture was concerning for gallbladder  carcinoma.  Mesenteric nodular thickening along the mid lower abdominal cavity indicating peritoneal carcinomatosis.  Soft tissue nodular mass in the left lower quadrant region of the pelvis measuring 3.6 x 2.7 cm, likely ovarian neoplastic lesion.  Multiple lung nodules in the lower lobes bilaterally, largest measuring 12 mm in the right lower lobe, concerning for pulmonary metastatic disease.   [facility] evaluation and management.  IR was consulted for peritoneal mass biopsy.  CA 19-9 was significant elevated at 14,368.  CEA was increased at 98.7, CA-125 was also increased at 91.3.   On 12/14/2023, MRI abdomen/MRCP showed thickened, contracted gallbladder with a heterogeneous mass arising from the gallbladder fundus, contiguous with the adjacent liver parenchyma of hepatic segment IVB measuring 3.5 x 2.8 cm. Multiple small rim enhancing lesions within the liver parenchyma. Enlarged, necrotic appearing portacaval and porta hepatis lymph nodes. Small, although rim enhancing and abnormal appearing retroperitoneal lymph nodes. Peritoneal stranding and nodularity, incompletely visualized on this examination although present in the left upper quadrant, consistent with peritoneal metastatic disease. Numerous small bilateral pulmonary nodules, better assessed by CT. 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   On 12/15/2023, she underwent CT-guided biopsy of the omental lesion.  Pathology showed metastatic well to moderately differentiated adenocarcinoma. The tumor is positive for cytokeratin 7 and shows focal weak positivity for the GI marker CDX2.  The tumor is negative for the GI markers cytokeratin 20.  The tumor is also negative for the GU and GYN marker PAX8.  The tumor is negative for the pulmonary adeno marker TTF-1. This immunohistochemical pattern and histomorphology would be compatible with the clinical suspicion  of a gallbladder primary.  The differential diagnosis would also include an upper GI or pancreaticobiliary primary.    Patient presented to our clinic on 12/24/2023 to establish care with us .  Request placed for staging PET scan.  NGS panel testing requested on the specimen and also liquid biopsy obtained.  It showed no actionable mutations.  Staging PET scan on 12/29/2023 showed known gallbladder mass extending into the liver.  Peritoneal carcinomatosis noted.  Numerous lung nodules were identified, small bowel with FDG uptake, concerning for metastatic disease in the lungs.  Metastatic lymphadenopathy was noted in the abdomen and retroperitoneum.  This scan will serve as a good baseline for future compression.    Plan for palliative systemic chemoimmunotherapy with cisplatin , gemcitabine , durvalumab . Scheduled to start C1D1 from 01/01/24.   Restaging PET scan on 03/21/2024 showed overall improvement in disease.  Oncology History  Adenocarcinoma of gallbladder (HCC)  12/24/2023 Initial Diagnosis   Adenocarcinoma of gallbladder (HCC)   12/24/2023 Cancer Staging   Staging form: Gallbladder, AJCC 8th Edition - Clinical: Stage IVB (cTX, cNX, pM1) - Signed by Autumn Millman, MD on 12/24/2023 Histologic grade (G): G2 Histologic grading system: 3 grade system   01/01/2024 -  Chemotherapy   Patient is on Treatment Plan : BILIARY TRACT Cisplatin  + Gemcitabine  D1,8 + Durvalumab  (1500) D1 q21d / Durvalumab  (1500) q28d         REVIEW OF SYSTEMS:   Review of Systems - Oncology  All other pertinent systems were reviewed with the patient and are negative.  ALLERGIES: She is allergic to sulfa antibiotics, forteo  [parathyroid hormone (recomb)], atorvastatin, and metformin hcl.  MEDICATIONS:  Current  Outpatient Medications  Medication Sig Dispense Refill   albuterol  (VENTOLIN  HFA) 108 (90 Base) MCG/ACT inhaler Inhale 1-2 puffs into the lungs every 6 (six) hours as needed for wheezing or shortness of  breath. 8 g 6   aspirin  81 MG EC tablet Take 81 mg by mouth daily.     budesonide -glycopyrrolate -formoterol  (BREZTRI  AEROSPHERE) 160-9-4.8 MCG/ACT AERO inhaler Inhale 2 puffs into the lungs in the morning and at bedtime. 10.7 g 11   dexamethasone  (DECADRON ) 4 MG tablet Take 2 tablets (8 mg) by mouth daily x 3 days starting the day after cisplatin  chemotherapy. Take with food. 30 tablet 1   docusate sodium  (COLACE) 100 MG capsule Take 500 mg by mouth at bedtime.     fluconazole  (DIFLUCAN ) 200 MG tablet Take 1 tablet (200 mg total) by mouth daily. 14 tablet 0   lidocaine -prilocaine  (EMLA ) cream Apply to affected area once 30 g 3   magic mouthwash (nystatin, lidocaine , diphenhydrAMINE , alum & mag hydroxide) suspension Take 5 mLs by mouth every 4 (four) hours as needed for mouth pain. Suspension contains equal amounts of Maalox, nystatin, diphenhydramine  and lidocaine . 1:1:1:1 ratio. 420 mL 1   magnesium  oxide (MAG-OX) 400 (240 Mg) MG tablet Take 1 tablet (400 mg total) by mouth 2 (two) times daily. 60 tablet 1   megestrol  (MEGACE  ES) 625 MG/5ML suspension Take 5 mLs (625 mg total) by mouth daily. 150 mL 3   methylPREDNISolone  (MEDROL  DOSEPAK) 4 MG TBPK tablet Take as directed on package instructions 21 tablet 1   metoprolol  succinate (TOPROL -XL) 50 MG 24 hr tablet TAKE 1 TABLET BY MOUTH ONCE DAILY. TAKE WITH OR IMMEDIATELY FOLLOWING A MEAL 90 tablet 1   morphine  (MS CONTIN ) 15 MG 12 hr tablet Take 1 tablet (15 mg total) by mouth every 12 (twelve) hours. 60 tablet 0   nystatin (MYCOSTATIN) 100000 UNIT/ML suspension Take 5 mLs by mouth 4 (four) times daily.     omeprazole  (PRILOSEC) 20 MG capsule Take 1 capsule (20 mg total) by mouth daily. 60 capsule 0   ondansetron  (ZOFRAN ) 4 MG tablet Take 1 tablet (4 mg total) by mouth every 6 (six) hours as needed for nausea. 30 tablet 0   ondansetron  (ZOFRAN ) 8 MG tablet Take 1 tablet (8 mg total) by mouth every 8 (eight) hours as needed for nausea or vomiting.  Start on the third day after cisplatin . 30 tablet 1   oxyCODONE  10 MG TABS Take 1-1.5 tablets (10-15 mg total) by mouth every 4 (four) hours as needed for moderate pain (pain score 4-6) or severe pain (pain score 7-10). 60 tablet 0   polyethylene glycol (MIRALAX  / GLYCOLAX ) 17 g packet Take 17 g by mouth 2 (two) times daily.     prochlorperazine  (COMPAZINE ) 10 MG tablet Take 1 tablet (10 mg total) by mouth every 6 (six) hours as needed (Nausea or vomiting). 30 tablet 1   rosuvastatin  (CRESTOR ) 10 MG tablet Take 1 tablet by mouth once daily 90 tablet 0   spironolactone  (ALDACTONE ) 25 MG tablet TAKE 1 TABLET BY MOUTH ONCE DAILY IN THE MORNING 30 tablet 0   No current facility-administered medications for this visit.    VITALS:   Blood pressure 138/70, pulse 75, temperature 98 F (36.7 C), temperature source Temporal, resp. rate 17, height 5' 2 (1.575 m), weight 158 lb 14.4 oz (72.1 kg), SpO2 98%.  Wt Readings from Last 3 Encounters:  05/06/24 158 lb 14.4 oz (72.1 kg)  05/02/24 157 lb 8 oz (71.4 kg)  04/28/24 154 lb (69.9 kg)    Body mass index is 29.06 kg/m.    Onc Performance Status - 05/06/24 0900       ECOG Perf Status   ECOG Perf Status Restricted in physically strenuous activity but ambulatory and able to carry out work of a light or sedentary nature, e.g., light house work, office work      KPS SCALE   KPS % SCORE Able to carry on normal activity, minor s/s of disease            PHYSICAL EXAM:   Physical Exam Constitutional:      General: She is not in acute distress.    Appearance: Normal appearance.  HENT:     Head: Normocephalic and atraumatic.     Mouth/Throat:     Comments: Angular cheilitis Cardiovascular:     Rate and Rhythm: Normal rate.  Pulmonary:     Effort: Pulmonary effort is normal. No respiratory distress.  Abdominal:     General: There is no distension.  Lymphadenopathy:     Cervical: Cervical adenopathy: Possible small left cervical lymph  node, nonfixed.  Skin:    Findings: Rash: Scattered maculopapular rash on torso, extremities.  Neurological:     General: No focal deficit present.     Mental Status: She is alert and oriented to person, place, and time.  Psychiatric:        Mood and Affect: Mood normal.        Behavior: Behavior normal.       LABORATORY DATA:   I have reviewed the data as listed.  Results for orders placed or performed in visit on 05/06/24  Magnesium   Result Value Ref Range   Magnesium  1.9 1.7 - 2.4 mg/dL  CMP ([facility] only)  Result Value Ref Range   Sodium 142 135 - 145 mmol/L   Potassium 3.3 (L) 3.5 - 5.1 mmol/L   Chloride 108 98 - 111 mmol/L   CO2 27 22 - 32 mmol/L   Glucose, Bld 155 (H) 70 - 99 mg/dL   BUN 20 8 - 23 mg/dL   Creatinine 8.96 (H) 9.55 - 1.00 mg/dL   Calcium  8.5 (L) 8.9 - 10.3 mg/dL   Total Protein 5.2 (L) 6.5 - 8.1 g/dL   Albumin  3.0 (L) 3.5 - 5.0 g/dL   AST 34 15 - 41 U/L   ALT 38 0 - 44 U/L   Alkaline Phosphatase 117 38 - 126 U/L   Total Bilirubin 0.5 0.0 - 1.2 mg/dL   GFR, Estimated 56 (L) >60 mL/min   Anion gap 7 5 - 15  CBC with Differential ([facility] Only)  Result Value Ref Range   WBC Count 4.4 4.0 - 10.5 K/uL   RBC 2.65 (L) 3.87 - 5.11 MIL/uL   Hemoglobin 8.6 (L) 12.0 - 15.0 g/dL   HCT 74.5 (L) 63.9 - 53.9 %   MCV 95.8 80.0 - 100.0 fL   MCH 32.5 26.0 - 34.0 pg   MCHC 33.9 30.0 - 36.0 g/dL   RDW 78.6 (H) 88.4 - 84.4 %   Platelet Count 126 (L) 150 - 400 K/uL   nRBC 0.0 0.0 - 0.2 %   Neutrophils Relative % 55 %   Neutro Abs 2.4 1.7 - 7.7 K/uL   Lymphocytes Relative 21 %   Lymphs Abs 0.9 0.7 - 4.0 K/uL   Monocytes Relative 19 %   Monocytes Absolute 0.9 0.1 - 1.0 K/uL   Eosinophils Relative 1 %  Eosinophils Absolute 0.0 0.0 - 0.5 K/uL   Basophils Relative 0 %   Basophils Absolute 0.0 0.0 - 0.1 K/uL   Immature Granulocytes 4 %   Abs Immature Granulocytes 0.17 (H) 0.00 - 0.07 K/uL  Sample to Blood Bank  Result Value Ref Range   Blood  Bank Specimen SAMPLE AVAILABLE FOR TESTING    Sample Expiration      05/09/2024,2359 Performed at [facility], [address], [place], [place] [address]        RADIOGRAPHIC STUDIES:  I have personally reviewed the radiological images as listed and agree with the findings in the report.  NM PET Image Restag (PS) Skull Base To Thigh EXAM: PET AND CT SKULL BASE TO MID THIGH 03/21/2024 01:07:18 PM  TECHNIQUE: PET imaging was acquired from the base of the skull to the mid thighs. Non-contrast enhanced computed tomography was obtained for attenuation correction and anatomic localization.  RADIOPHARMACEUTICAL: 8.2 mCi F-18 FDG Uptake time 60 minutes. Glucose level 103 mg/dl.  COMPARISON: PET of 12/28/2023.  CLINICAL HISTORY: Restaging stage IV adenocarcinoma of gallbladder.  FINDINGS:  HEAD AND NECK: No cervical nodal hypermetabolism. No cervical adenopathy. Left carotid atherosclerosis.  CHEST: No thoracic nodal hypermetabolism. Index right upper lobe pulmonary nodule measures 7 mm and SUV 2.0 on image 23/7 versus 8 mm and suv 3.7 previously. SABRA An index superior segment left lower lobe nodule on the prior exam that measured 6 mm and SUV 3.3 has essentially resolved. No residual hypermetabolism in this area. Innumerable smaller pulmonary nodules are also less distinct today, for example within the right lower lobe at 7 mm in image 46/7 versus 10 mm on the prior exam.  ABDOMEN AND PELVIS: Hypermetabolism along the gallbladder fundus and extending into segment 4b of the liver is less distinct today, for example at SUV 7.1 on image 116/4, similar to SUV 6.8 on the prior exam. Porta hepatis node measures 1.0 cm and SUV 5.4 on image 108/4 compared to 1.2 cm and SUV 5.1 on the prior exam. The abdominal retroperitoneal hypermetabolic nodes have resolved. Anterior inferior omental hypermetabolic implant measures 2.7 x 1.7 cm and SUV 4.9 on image  153/4 compared to 2.9 x 1.6 cm and SUV 5.3 on the prior exam. Smaller more cephalad abdominal omental implants are similar. Left pelvic/adnexal implant measures 4.8 x 2.6 cm and SUV 5.6 on image 163/4 versus 4.2 x 3.0 cm and SUV 17.1 on the prior exam. Diffuse colonic hypermetabolism, most significant in the low rectum and anus at SUV 14.9, no CT correlate. Normal adrenal glands. Abdominal aortic atherosclerosis. Hysterectomy. Pelvic floor laxity.  BONES AND SOFT TISSUE: No abnormal FDG activity localizes to the bones. No metabolically active aggressive osseous lesion. Thoracolumbar spine fixation.  VASCULATURE: Right port-a-cath tip mid right atrium. Aortic and coronary artery calcifications.  IMPRESSION: 1. Minimal response to therapy, as evidenced by decreased size and hypermetabolism of Pulmonary Metastasis, gallbladder primary and abdominal nodal metastasis. Omental and left pelvic peritoneal/adnexal implants are relatively similar. 2.  No new sites of disease. 3. Anorectal hypermetabolism is most likely physiologic 4. Incidental findings, including: Aortic atherosclerosis (icd10-i70.0). Coronary artery atherosclerosis.  Electronically signed by: Rockey Kilts MD 03/21/2024 01:33 PM EDT RP Workstation: [id number]     CODE STATUS:  Code Status History     Date Active Date Inactive Code Status Order ID Comments User Context   12/14/2023 1601 12/16/2023 2004 Limited: Do not attempt resuscitation (DNR) -DNR-LIMITED -Do Not Intubate/DNI  [id number]  Caleen, Ankit C,  MD ED   09/17/2022 1236 09/18/2022 1519 Full Code [id number]  Onetha Kuba, MD Inpatient   02/27/2021 1602 03/09/2021 2325 Full Code [id number]  Onetha Kuba, MD Inpatient   06/27/2020 1110 06/28/2020 0504 Full Code [id number]  Derrill Dawn, MD HOV    Questions for Most Recent Historical Code Status (Order [id number])     Question Answer   If pulseless and not breathing No CPR or chest compressions.   In Pre-Arrest Conditions  (Patient Is Breathing and Has A Pulse) Do not intubate. Provide all appropriate non-invasive medical interventions. Avoid ICU transfer unless indicated or required.   Consent: Discussion documented in EHR or advanced directives reviewed                Advance Directive Documentation    Flowsheet Row Most Recent Value  Type of Advance Directive Healthcare Power of Attorney, Living will  Pre-existing out of facility DNR order (yellow form or pink MOST form) --  MOST Form in Place? --    No orders of the defined types were placed in this encounter.    Future Appointments  Date Time Provider Department [facility]  05/09/2024  9:10 AM [facility]  05/12/2024 11:15 AM [facility] FLUSH [facility] None  05/12/2024 11:45 AM Gianne Shugars, MD [facility] None  05/13/2024  9:30 AM [facility] INFUSION [facility] None  05/13/2024 10:30 AM Ivonne Harlene RAMAN, RD [facility] None  06/01/2024 10:00 AM Woodard Burnard BROCKS, Counselor [facility] None  06/01/2024 11:00 AM [facility] LAB [facility] None     This document was completed utilizing speech recognition software. Grammatical errors, random word insertions, pronoun errors, and incomplete sentences are an occasional consequence of this system due to software limitations, ambient noise, and hardware issues. Any formal questions or concerns about the content, text or information contained within the body of this dictation should be directly addressed to the provider for clarification.

## 2024-05-06 NOTE — Assessment & Plan Note (Addendum)
 Please review oncology history for additional details and timeline of events.  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 Biopsy from the omentum confirmed adenocarcinoma, originating from the gallbladder.  Immunostains support diagnosis of gallbladder carcinoma.    Previously I discussed diagnosis, staging, prognosis, plan of care, treatment options.  Reviewed NCCN guidelines. Given stage IV disease, all treatment options are palliative in nature and not curative in intent.  This was discussed with patient, her daughter and son-in-law who were accompanying.  They verbalized understanding.    Discussed the aggressive nature of stage IV gallbladder adenocarcinoma and overall poor prognosis.  Life expectancy with treatment is 1 to 1.5 years; without treatment, less than six months.   NGS panel testing on the specimen and also liquid biopsy for mutation analysis showed no actionable mutations.  Staging PET scan on 12/29/2023 showed known gallbladder mass extending into the liver.  Peritoneal carcinomatosis noted.  Numerous lung nodules were identified, small bowel with FDG uptake, concerning for metastatic disease in the lungs.  Metastatic lymphadenopathy was noted in the abdomen and retroperitoneum.  This scan will serve as a good baseline for future compression.  Discussed results with the patient and family members previously.   Plan made to proceed with palliative systemic chemoimmunotherapy with gemcitabine , cisplatin  and durvalumab .  She started this from 01/01/2024.  Following completion of 4 cycles of chemotherapy, restaging PET scan on 03/21/2024 showed overall improvement with improvement in the lesions in the lungs, lymph nodes, and omentum.   Plan was to continue current management for up to 8 cycles.  If cytopenias or other side effects are prohibitive, we will plan for at least 6 cycles.  Patient was tolerating chemotherapy reasonably well up until now.  Over the  last 3 weeks, she has been having progressive skin rash, with concern for immune mediated dermatitis.  She also has had odynophagia which was felt to be from Candida esophagitis.  We arranged for EGD but patient could not go to the appointment because of severe diarrhea after previous episodes of constipation.  No odynophagia has resolved with continued use of nystatin mouthwash and Diflucan .  Skin rash seems to be improving as well.  Plan to reevaluate in 1 week to assess her symptoms.  If continued improvement is noted, plan to resume systemic treatments.  Previously we sent a referral to genetic counselor for genetic testing.

## 2024-05-09 ENCOUNTER — Other Ambulatory Visit (HOSPITAL_COMMUNITY): Payer: Self-pay

## 2024-05-09 ENCOUNTER — Ambulatory Visit

## 2024-05-09 VITALS — BP 179/78 | HR 74 | Temp 97.6°F | Ht 62.0 in | Wt 162.0 lb

## 2024-05-09 DIAGNOSIS — E876 Hypokalemia: Secondary | ICD-10-CM

## 2024-05-09 DIAGNOSIS — C786 Secondary malignant neoplasm of retroperitoneum and peritoneum: Secondary | ICD-10-CM | POA: Diagnosis not present

## 2024-05-09 DIAGNOSIS — I10 Essential (primary) hypertension: Secondary | ICD-10-CM | POA: Diagnosis not present

## 2024-05-09 DIAGNOSIS — Z23 Encounter for immunization: Secondary | ICD-10-CM

## 2024-05-09 DIAGNOSIS — E119 Type 2 diabetes mellitus without complications: Secondary | ICD-10-CM | POA: Insufficient documentation

## 2024-05-09 DIAGNOSIS — R7303 Prediabetes: Secondary | ICD-10-CM

## 2024-05-09 MED ORDER — LOSARTAN POTASSIUM 50 MG PO TABS
50.0000 mg | ORAL_TABLET | Freq: Every day | ORAL | 1 refills | Status: DC
  Filled 2024-05-09 (×2): qty 90, 90d supply, fill #0

## 2024-05-09 NOTE — Progress Notes (Signed)
 Established Patient Office Visit  Subjective   Patient ID: Janice Pace, female    DOB: April 10, 1946  Age: 78 y.o. MRN: [id number]  Chief Complaint  Patient presents with   Hypertension    HPI   History of Present Illness   [name] is a 78 year old female with hypertension who presents with elevated blood pressure.  Hypertension - Elevated blood pressure despite metoprolol  therapy - No headaches, dizziness, or vision changes - Occasionally checks blood pressure at home - Currently taking spironolactone  and metoprolol  for blood pressure management  Chemotherapy-related dermatologic adverse effects - Undergoing palliative chemotherapy for carcinomatosis peritonei.  - Blisters present on arms, chest, and legs, which her oncologists think is likely a reaction to the chemotherapy. - Blisters are closed and non-painful, but she reports they are pruritic.  - Uses aloe vera for symptomatic relief  Electrolyte abnormality - Potassium levels have been slightly low since starting chemo. Not currently on oral supplementation. Was advised by oncologist to eat bananas or oranges daily to boost levels.   Vascular access for laboratory monitoring - Has a port for blood work, which is convenient due to frequent blood tests  Glycemic control - Last hemoglobin A1c in June was 5.6, indicating good glycemic control          ROS Per HPI.    Objective:     BP (!) 179/78   Pulse 74   Temp 97.6 F (36.4 C) (Oral)   Ht 5' 2 (1.575 m)   Wt 162 lb 0.6 oz (73.5 kg)   SpO2 98%   BMI 29.64 kg/m    Physical Exam Constitutional:      General: She is not in acute distress.    Appearance: Normal appearance.  Cardiovascular:     Rate and Rhythm: Normal rate and regular rhythm.     Heart sounds: Normal heart sounds. No murmur heard.    No friction rub. No gallop.  Pulmonary:     Effort: Pulmonary effort is normal. No respiratory distress.     Breath sounds: Normal breath  sounds.  Musculoskeletal:        General: No swelling.  Skin:    General: Skin is warm and dry.     Comments: Healing blisters present over dorsal surface of bilateral forearms and neck/chest  Neurological:     General: No focal deficit present.     Mental Status: She is alert.  Psychiatric:        Mood and Affect: Mood normal.        Behavior: Behavior normal.        Thought Content: Thought content normal.      No results found for any visits on 05/09/24.  Last CBC Lab Results  Component Value Date   WBC 4.4 05/06/2024   HGB 8.6 (L) 05/06/2024   HCT 25.4 (L) 05/06/2024   MCV 95.8 05/06/2024   MCH 32.5 05/06/2024   RDW 21.3 (H) 05/06/2024   PLT 126 (L) 05/06/2024   Last metabolic panel Lab Results  Component Value Date   GLUCOSE 155 (H) 05/06/2024   NA 142 05/06/2024   K 3.3 (L) 05/06/2024   CL 108 05/06/2024   CO2 27 05/06/2024   BUN 20 05/06/2024   CREATININE 1.03 (H) 05/06/2024   GFRNONAA 56 (L) 05/06/2024   CALCIUM  8.5 (L) 05/06/2024   PROT 5.2 (L) 05/06/2024   ALBUMIN  3.0 (L) 05/06/2024   LABGLOB 2.5 12/22/2023   AGRATIO 1.7 12/02/2022  BILITOT 0.5 05/06/2024   ALKPHOS 117 05/06/2024   AST 34 05/06/2024   ALT 38 05/06/2024   ANIONGAP 7 05/06/2024   Last hemoglobin A1c Lab Results  Component Value Date   HGBA1C 5.6 12/22/2023      The ASCVD Risk score (Arnett DK, et al., 2019) failed to calculate for the following reasons:   The valid total cholesterol range is 130 to 320 mg/dL    Assessment & Plan:   Encounter for vaccination -     Flu vaccine HIGH DOSE PF(Fluzone Trivalent)  Carcinomatosis peritonei (HCC) Assessment & Plan: Chemotherapy effective with no new growths. Blisters managed with aloe vera. Oncologist monitoring. - Continue chemotherapy as scheduled. - Use aloe vera for blister management. - Provided with picture so she can try OTC CeraVe lotion which may provide more benefit for skin and itching   Essential  hypertension Assessment & Plan: BP goal <130/80. Above goal in office today 204/79, 179/78 on recheck. Is compliant with Metoprolol  50 mg daily and Spironolactone  25 mg daily. Given elevated readings with concurrent hypokalemia, will add Losartan 50 mg daily. Provided patient with BP log and instructed to check BP daily at least 2 hours after medications. Will do a follow up in 4 weeks and if still above goal, will increase Losartan to 100 mg.    Hypokalemia Assessment & Plan: Last K 3.3 on 05/06/24. Continue eating potassium rich foods (bananas, oranges, etc.). Suspect that addition of Losartan 50 mg in combination with her Spironolactone  will boost K levels over time. Will cont to monitor.   Type 2 diabetes mellitus without complication, without long-term current use of insulin (HCC) Assessment & Plan: Last A1c in June was 5.6. Not currently on any glucose lowering medications. Will recheck A1c and kidney function   Prediabetes Assessment & Plan: Last A1c in June 5.6. Not currently on glucose lowering medications. Will cont to monitor.   Other orders -     Losartan Potassium; Take 1 tablet (50 mg total) by mouth daily.  Dispense: 90 tablet; Refill: 1     Return in about 4 weeks (around 06/06/2024) for HTN.    Saddie JULIANNA Sacks, PA-C

## 2024-05-09 NOTE — Assessment & Plan Note (Signed)
 BP goal <130/80. Above goal in office today 204/79, 179/78 on recheck. Is compliant with Metoprolol  50 mg daily and Spironolactone  25 mg daily. Given elevated readings with concurrent hypokalemia, will add Losartan 50 mg daily. Provided patient with BP log and instructed to check BP daily at least 2 hours after medications. Will do a follow up in 4 weeks and if still above goal, will increase Losartan to 100 mg.

## 2024-05-09 NOTE — Assessment & Plan Note (Signed)
 Last A1c in June was 5.6. Not currently on any glucose lowering medications. Will recheck A1c and kidney function

## 2024-05-09 NOTE — Assessment & Plan Note (Signed)
 Last K 3.3 on 05/06/24. Continue eating potassium rich foods (bananas, oranges, etc.). Suspect that addition of Losartan 50 mg in combination with her Spironolactone  will boost K levels over time. Will cont to monitor.

## 2024-05-09 NOTE — Assessment & Plan Note (Signed)
 Chemotherapy effective with no new growths. Blisters managed with aloe vera. Oncologist monitoring. - Continue chemotherapy as scheduled. - Use aloe vera for blister management. - Provided with picture so she can try OTC CeraVe lotion which may provide more benefit for skin and itching

## 2024-05-09 NOTE — Patient Instructions (Addendum)
 VISIT SUMMARY: Today, we addressed your elevated blood pressure, chemotherapy-related skin issues, and slightly low potassium levels. We also discussed your ongoing chemotherapy treatment and its effectiveness.  YOUR PLAN: MALIGNANT NEOPLASM UNDER ACTIVE CHEMOTHERAPY: Your chemotherapy is effective with no new growths. You have been experiencing blisters as a side effect. -Continue chemotherapy as scheduled. -Use aloe vera for blister management. - CeraVe lotion may help protect the skin barrier and help with the itching.        HYPERTENSION: Your blood pressure is elevated, likely due to chemotherapy. You are currently on metoprolol  and spironolactone . -Add losartan 50 mg daily in the morning. -Check your blood pressure 3-4 times a week, 2 hours after taking your medication. -Keep a blood pressure log for home monitoring. -Follow up in 4 weeks to reassess blood pressure control. -Your prescription has been sent to [facility].  HYPOKALEMIA: Your potassium levels are slightly low. -Adding losartan 50 mg daily should help increase your potassium levels. -Continue eating potassium-rich foods as you like.  If you have any problems before your next visit feel free to message me via MyChart (minor issues or questions) or call the office, otherwise you may reach out to schedule an office visit.  Thank you! Saddie Sacks, PA-C

## 2024-05-09 NOTE — Assessment & Plan Note (Signed)
 Last A1c in June 5.6. Not currently on glucose lowering medications. Will cont to monitor.

## 2024-05-10 ENCOUNTER — Other Ambulatory Visit: Payer: Self-pay

## 2024-05-11 ENCOUNTER — Observation Stay (HOSPITAL_COMMUNITY)

## 2024-05-11 ENCOUNTER — Telehealth: Payer: Self-pay

## 2024-05-11 ENCOUNTER — Emergency Department (HOSPITAL_COMMUNITY)

## 2024-05-11 ENCOUNTER — Inpatient Hospital Stay (HOSPITAL_COMMUNITY)
Admission: EM | Admit: 2024-05-11 | Discharge: 2024-05-21 | DRG: 435 | Disposition: A | Discharge status: Home / Self Care | Attending: Internal Medicine | Admitting: Internal Medicine

## 2024-05-11 ENCOUNTER — Encounter (HOSPITAL_COMMUNITY): Payer: Self-pay

## 2024-05-11 ENCOUNTER — Other Ambulatory Visit: Payer: Self-pay

## 2024-05-11 DIAGNOSIS — I81 Portal vein thrombosis: Secondary | ICD-10-CM | POA: Diagnosis present

## 2024-05-11 DIAGNOSIS — N179 Acute kidney failure, unspecified: Secondary | ICD-10-CM | POA: Diagnosis present

## 2024-05-11 DIAGNOSIS — Z79899 Other long term (current) drug therapy: Secondary | ICD-10-CM

## 2024-05-11 DIAGNOSIS — C23 Malignant neoplasm of gallbladder: Secondary | ICD-10-CM | POA: Diagnosis not present

## 2024-05-11 DIAGNOSIS — R109 Unspecified abdominal pain: Secondary | ICD-10-CM | POA: Diagnosis not present

## 2024-05-11 DIAGNOSIS — D630 Anemia in neoplastic disease: Secondary | ICD-10-CM | POA: Diagnosis present

## 2024-05-11 DIAGNOSIS — E876 Hypokalemia: Secondary | ICD-10-CM | POA: Diagnosis present

## 2024-05-11 DIAGNOSIS — R935 Abnormal findings on diagnostic imaging of other abdominal regions, including retroperitoneum: Secondary | ICD-10-CM | POA: Diagnosis not present

## 2024-05-11 DIAGNOSIS — Z7982 Long term (current) use of aspirin: Secondary | ICD-10-CM

## 2024-05-11 DIAGNOSIS — I10 Essential (primary) hypertension: Secondary | ICD-10-CM | POA: Diagnosis present

## 2024-05-11 DIAGNOSIS — Z860100 Personal history of colon polyps, unspecified: Secondary | ICD-10-CM

## 2024-05-11 DIAGNOSIS — Z86718 Personal history of other venous thrombosis and embolism: Secondary | ICD-10-CM

## 2024-05-11 DIAGNOSIS — Z87891 Personal history of nicotine dependence: Secondary | ICD-10-CM

## 2024-05-11 DIAGNOSIS — E785 Hyperlipidemia, unspecified: Secondary | ICD-10-CM | POA: Diagnosis present

## 2024-05-11 DIAGNOSIS — J4489 Other specified chronic obstructive pulmonary disease: Secondary | ICD-10-CM | POA: Diagnosis present

## 2024-05-11 DIAGNOSIS — C786 Secondary malignant neoplasm of retroperitoneum and peritoneum: Secondary | ICD-10-CM | POA: Diagnosis present

## 2024-05-11 DIAGNOSIS — Z882 Allergy status to sulfonamides status: Secondary | ICD-10-CM

## 2024-05-11 DIAGNOSIS — Z90711 Acquired absence of uterus with remaining cervical stump: Secondary | ICD-10-CM

## 2024-05-11 DIAGNOSIS — C787 Secondary malignant neoplasm of liver and intrahepatic bile duct: Secondary | ICD-10-CM | POA: Diagnosis present

## 2024-05-11 DIAGNOSIS — C7800 Secondary malignant neoplasm of unspecified lung: Secondary | ICD-10-CM | POA: Diagnosis present

## 2024-05-11 DIAGNOSIS — K760 Fatty (change of) liver, not elsewhere classified: Secondary | ICD-10-CM | POA: Diagnosis present

## 2024-05-11 DIAGNOSIS — R7989 Other specified abnormal findings of blood chemistry: Secondary | ICD-10-CM

## 2024-05-11 DIAGNOSIS — Z888 Allergy status to other drugs, medicaments and biological substances status: Secondary | ICD-10-CM

## 2024-05-11 DIAGNOSIS — K219 Gastro-esophageal reflux disease without esophagitis: Secondary | ICD-10-CM | POA: Diagnosis present

## 2024-05-11 DIAGNOSIS — B370 Candidal stomatitis: Secondary | ICD-10-CM | POA: Diagnosis present

## 2024-05-11 DIAGNOSIS — D6959 Other secondary thrombocytopenia: Secondary | ICD-10-CM | POA: Diagnosis present

## 2024-05-11 DIAGNOSIS — G893 Neoplasm related pain (acute) (chronic): Secondary | ICD-10-CM | POA: Diagnosis present

## 2024-05-11 DIAGNOSIS — R7303 Prediabetes: Secondary | ICD-10-CM | POA: Diagnosis present

## 2024-05-11 DIAGNOSIS — Z806 Family history of leukemia: Secondary | ICD-10-CM

## 2024-05-11 DIAGNOSIS — K831 Obstruction of bile duct: Secondary | ICD-10-CM | POA: Diagnosis present

## 2024-05-11 DIAGNOSIS — K5909 Other constipation: Secondary | ICD-10-CM | POA: Diagnosis present

## 2024-05-11 DIAGNOSIS — Z9221 Personal history of antineoplastic chemotherapy: Secondary | ICD-10-CM

## 2024-05-11 DIAGNOSIS — Z66 Do not resuscitate: Secondary | ICD-10-CM | POA: Diagnosis present

## 2024-05-11 DIAGNOSIS — L309 Dermatitis, unspecified: Secondary | ICD-10-CM | POA: Diagnosis present

## 2024-05-11 DIAGNOSIS — K7460 Unspecified cirrhosis of liver: Secondary | ICD-10-CM | POA: Diagnosis present

## 2024-05-11 DIAGNOSIS — R188 Other ascites: Secondary | ICD-10-CM | POA: Diagnosis present

## 2024-05-11 LAB — COMPREHENSIVE METABOLIC PANEL WITH GFR
ALT: 128 U/L — ABNORMAL HIGH (ref 0–44)
AST: 116 U/L — ABNORMAL HIGH (ref 15–41)
Albumin: 3.4 g/dL — ABNORMAL LOW (ref 3.5–5.0)
Alkaline Phosphatase: 175 U/L — ABNORMAL HIGH (ref 38–126)
Anion gap: 12 (ref 5–15)
BUN: 16 mg/dL (ref 8–23)
CO2: 23 mmol/L (ref 22–32)
Calcium: 8.6 mg/dL — ABNORMAL LOW (ref 8.9–10.3)
Chloride: 106 mmol/L (ref 98–111)
Creatinine, Ser: 1.36 mg/dL — ABNORMAL HIGH (ref 0.44–1.00)
GFR, Estimated: 40 mL/min — ABNORMAL LOW (ref >60)
Glucose, Bld: 95 mg/dL (ref 70–99)
Potassium: 3.3 mmol/L — ABNORMAL LOW (ref 3.5–5.1)
Sodium: 141 mmol/L (ref 135–145)
Total Bilirubin: 2.8 mg/dL — ABNORMAL HIGH (ref 0.0–1.2)
Total Protein: 5.6 g/dL — ABNORMAL LOW (ref 6.5–8.1)

## 2024-05-11 LAB — CBC WITH DIFFERENTIAL/PLATELET
Abs Immature Granulocytes: 0.27 K/uL — ABNORMAL HIGH (ref 0.00–0.07)
Basophils Absolute: 0 K/uL (ref 0.0–0.1)
Basophils Relative: 0 %
Eosinophils Absolute: 0 K/uL (ref 0.0–0.5)
Eosinophils Relative: 0 %
HCT: 27.8 % — ABNORMAL LOW (ref 36.0–46.0)
Hemoglobin: 9.3 g/dL — ABNORMAL LOW (ref 12.0–15.0)
Immature Granulocytes: 4 %
Lymphocytes Relative: 20 %
Lymphs Abs: 1.3 K/uL (ref 0.7–4.0)
MCH: 32.9 pg (ref 26.0–34.0)
MCHC: 33.5 g/dL (ref 30.0–36.0)
MCV: 98.2 fL (ref 80.0–100.0)
Monocytes Absolute: 1 K/uL (ref 0.1–1.0)
Monocytes Relative: 16 %
Neutro Abs: 3.7 K/uL (ref 1.7–7.7)
Neutrophils Relative %: 60 %
Platelets: 125 K/uL — ABNORMAL LOW (ref 150–400)
RBC: 2.83 MIL/uL — ABNORMAL LOW (ref 3.87–5.11)
RDW: 21.2 % — ABNORMAL HIGH (ref 11.5–15.5)
WBC: 6.3 K/uL (ref 4.0–10.5)
nRBC: 0 % (ref 0.0–0.2)

## 2024-05-11 LAB — URINALYSIS, ROUTINE W REFLEX MICROSCOPIC
Bilirubin Urine: NEGATIVE
Glucose, UA: NEGATIVE mg/dL
Hgb urine dipstick: NEGATIVE
Ketones, ur: NEGATIVE mg/dL
Leukocytes,Ua: NEGATIVE
Nitrite: NEGATIVE
Protein, ur: NEGATIVE mg/dL
Specific Gravity, Urine: 1.028 (ref 1.005–1.030)
pH: 6 (ref 5.0–8.0)

## 2024-05-11 LAB — LIPASE, BLOOD: Lipase: 24 U/L (ref 11–51)

## 2024-05-11 MED ORDER — DOCUSATE SODIUM 100 MG PO CAPS
100.0000 mg | ORAL_CAPSULE | Freq: Two times a day (BID) | ORAL | Status: DC
  Administered 2024-05-11 – 2024-05-20 (×17): 100 mg via ORAL
  Filled 2024-05-11 (×19): qty 1

## 2024-05-11 MED ORDER — BISACODYL 10 MG RE SUPP
10.0000 mg | Freq: Every day | RECTAL | Status: DC | PRN
Start: 2024-05-11 — End: 2024-05-21

## 2024-05-11 MED ORDER — OXYCODONE-ACETAMINOPHEN 5-325 MG PO TABS
1.0000 | ORAL_TABLET | Freq: Once | ORAL | Status: AC
  Administered 2024-05-11: 1 via ORAL
  Filled 2024-05-11: qty 1

## 2024-05-11 MED ORDER — MORPHINE SULFATE (PF) 4 MG/ML IV SOLN
4.0000 mg | Freq: Once | INTRAVENOUS | Status: AC
  Administered 2024-05-11: 4 mg via INTRAVENOUS
  Filled 2024-05-11: qty 1

## 2024-05-11 MED ORDER — CHLORHEXIDINE GLUCONATE CLOTH 2 % EX PADS
6.0000 | MEDICATED_PAD | Freq: Every day | CUTANEOUS | Status: DC
  Administered 2024-05-12 – 2024-05-21 (×9): 6 via TOPICAL

## 2024-05-11 MED ORDER — IOHEXOL 300 MG/ML  SOLN
80.0000 mL | Freq: Once | INTRAMUSCULAR | Status: AC | PRN
  Administered 2024-05-11: 80 mL via INTRAVENOUS

## 2024-05-11 MED ORDER — POLYETHYLENE GLYCOL 3350 17 G PO PACK
17.0000 g | PACK | Freq: Two times a day (BID) | ORAL | Status: DC
  Administered 2024-05-11 – 2024-05-20 (×17): 17 g via ORAL
  Filled 2024-05-11 (×18): qty 1

## 2024-05-11 MED ORDER — ALBUTEROL SULFATE (2.5 MG/3ML) 0.083% IN NEBU: 2.5000 mg | INHALATION_SOLUTION | RESPIRATORY_TRACT | Status: DC | PRN

## 2024-05-11 MED ORDER — BISACODYL 10 MG RE SUPP
10.0000 mg | Freq: Once | RECTAL | Status: AC
  Administered 2024-05-11: 10 mg via RECTAL
  Filled 2024-05-11: qty 1

## 2024-05-11 MED ORDER — ONDANSETRON HCL 4 MG/2ML IJ SOLN: 4.0000 mg | Freq: Four times a day (QID) | INTRAMUSCULAR | Status: DC | PRN

## 2024-05-11 MED ORDER — MORPHINE SULFATE ER 15 MG PO TBCR
15.0000 mg | EXTENDED_RELEASE_TABLET | Freq: Two times a day (BID) | ORAL | Status: DC
Start: 2024-05-11 — End: 2024-05-21
  Administered 2024-05-11 – 2024-05-21 (×19): 15 mg via ORAL
  Filled 2024-05-11 (×19): qty 1

## 2024-05-11 MED ORDER — ONDANSETRON HCL 4 MG/2ML IJ SOLN
4.0000 mg | Freq: Once | INTRAMUSCULAR | Status: AC
  Administered 2024-05-11: 4 mg via INTRAVENOUS
  Filled 2024-05-11: qty 2

## 2024-05-11 MED ORDER — OXYCODONE HCL 5 MG PO TABS: 10.0000 mg | ORAL_TABLET | ORAL | Status: DC | PRN

## 2024-05-11 MED ORDER — SODIUM CHLORIDE 0.9 % IV BOLUS
1000.0000 mL | Freq: Once | INTRAVENOUS | Status: AC
  Administered 2024-05-11: 1000 mL via INTRAVENOUS

## 2024-05-11 MED ORDER — ENOXAPARIN SODIUM 40 MG/0.4ML IJ SOSY
40.0000 mg | PREFILLED_SYRINGE | INTRAMUSCULAR | Status: DC
  Administered 2024-05-11 – 2024-05-12 (×2): 40 mg via SUBCUTANEOUS
  Filled 2024-05-11 (×2): qty 0.4

## 2024-05-11 MED ORDER — SODIUM CHLORIDE 0.9 % IV BOLUS
500.0000 mL | Freq: Once | INTRAVENOUS | Status: AC
  Administered 2024-05-11: 500 mL via INTRAVENOUS

## 2024-05-11 MED ORDER — METHOCARBAMOL 1000 MG/10ML IJ SOLN: 500.0000 mg | Freq: Four times a day (QID) | INTRAMUSCULAR | Status: DC | PRN

## 2024-05-11 MED ORDER — PHENYLEPHRINE-MINERAL OIL-PET 0.25-14-74.9 % RE OINT
1.0000 | TOPICAL_OINTMENT | Freq: Two times a day (BID) | RECTAL | Status: DC | PRN
  Administered 2024-05-12: 1 via RECTAL
  Filled 2024-05-11: qty 57

## 2024-05-11 MED ORDER — LACTATED RINGERS IV SOLN: INTRAVENOUS | Status: AC

## 2024-05-11 MED ORDER — BUDESON-GLYCOPYRROL-FORMOTEROL 160-9-4.8 MCG/ACT IN AERO
2.0000 | INHALATION_SPRAY | Freq: Two times a day (BID) | RESPIRATORY_TRACT | Status: DC
  Administered 2024-05-11 – 2024-05-12 (×2): 2 via RESPIRATORY_TRACT
  Filled 2024-05-11: qty 5.9

## 2024-05-11 MED ORDER — SENNA 8.6 MG PO TABS
1.0000 | ORAL_TABLET | Freq: Two times a day (BID) | ORAL | Status: DC
  Administered 2024-05-11 – 2024-05-21 (×18): 8.6 mg via ORAL
  Filled 2024-05-11 (×20): qty 1

## 2024-05-11 MED ORDER — HYDRALAZINE HCL 20 MG/ML IJ SOLN
10.0000 mg | Freq: Four times a day (QID) | INTRAMUSCULAR | Status: DC | PRN
  Administered 2024-05-11 – 2024-05-15 (×3): 10 mg via INTRAVENOUS
  Filled 2024-05-11 (×3): qty 1

## 2024-05-11 MED ORDER — POTASSIUM CHLORIDE CRYS ER 20 MEQ PO TBCR
20.0000 meq | EXTENDED_RELEASE_TABLET | Freq: Once | ORAL | Status: AC
  Administered 2024-05-11: 20 meq via ORAL
  Filled 2024-05-11: qty 1

## 2024-05-11 MED ORDER — MORPHINE SULFATE (PF) 2 MG/ML IV SOLN
2.0000 mg | INTRAVENOUS | Status: DC | PRN
  Administered 2024-05-11 – 2024-05-20 (×18): 2 mg via INTRAVENOUS
  Filled 2024-05-11 (×21): qty 1

## 2024-05-11 MED ORDER — ONDANSETRON HCL 4 MG PO TABS: 4.0000 mg | ORAL_TABLET | Freq: Four times a day (QID) | ORAL | Status: DC | PRN

## 2024-05-11 NOTE — ED Provider Triage Note (Signed)
 Emergency Medicine Provider Triage Evaluation Note  Janice HENDRIKS , a 78 y.o. female  was evaluated in triage.  Pt complains of abdominal pain.  She reports feelings of lower abdominal pain towards the rectum with increased difficulty with bowel movements.  States last bowel movement about 2 days ago.  Denies any vomiting but does endorse nausea.  No history of bowel obstruction she is currently undergoing radiation therapy for gallbladder cancer.  Review of Systems  Positive: As above Negative: As above  Physical Exam  BP (!) 171/100 (BP Location: Left Arm)   Pulse 80   Temp 98.6 F (37 C) (Oral)   Resp 18   SpO2 100%  Gen:   Awake, no distress   Resp:  Normal effort  MSK:   Moves extremities without difficulty  Other:  Tenderness to palpation along the lower abdomen towards the suprapubic and left lower quadrant  Medical Decision Making  Medically screening exam initiated at 12:41 PM.  Appropriate orders placed.  Sherrilyn LITTIE Blush was informed that the remainder of the evaluation will be completed by another provider, this initial triage assessment does not replace that evaluation, and the importance of remaining in the ED until their evaluation is complete.     Malaka Ruffner A, PA-C 05/11/24 1241

## 2024-05-11 NOTE — ED Provider Notes (Signed)
 Care assumed from Dr. Laurice.  At time of transfer of care, patient is awaiting reassessment after abdominal imaging with likely plans to admit to the hospital due to severe abdominal pain in the setting of known abdominal cancer.   5:17 PM CT scan returned without evidence of acute obstruction but it does show several new liver lesions.  Her LFTs also are a good bit more elevated than they were last week.  Unclear if this is the cause of the discomfort but she is still having 8 out of 10 pain and needs more morphine .  She has needed multiple doses of morphine  to help with her pain control is still not under great control.  She is still not able to urinate for us  but denies dysuria.  Will give more fluids as her labs do show increase in her creatinine.  Given the LFT changes, increasing creatinine, uncontrolled pain, and these new cancer findings, will call for medical admission for pain control rehydration and patient may need to have the oncology team see her during her admission to make sure nothing needs to be changed for her.  5:33 PM Spoke to medicine who will admit for further management.  They requested I consult oncology to have them weigh in tomorrow.  5:51 PM Just spoke to oncology who said they will let the patient's primary oncologist Dr. Autumn know and anticipate he will be able to swing by tomorrow during the admission.    Clinical Impression: 1. Abdominal pain, unspecified abdominal location   2. LFT elevation   3. Abnormal CT of the abdomen     Disposition: Admit  This note was prepared with assistance of Dragon voice recognition software. Occasional wrong-word or sound-a-like substitutions may have occurred due to the inherent limitations of voice recognition software.     Darrold Bezek, Lonni PARAS, MD 05/11/24 [phone]

## 2024-05-11 NOTE — ED Notes (Signed)
 Patient states she is unable to void at this time and can't get anything out when trying

## 2024-05-11 NOTE — ED Notes (Signed)
Pt request port access for labs. 

## 2024-05-11 NOTE — ED Triage Notes (Signed)
 Pt arrived via POV, c/o abd and rectal pain. States she can feel her colon spasming Denies any n/v or urinary issues.

## 2024-05-11 NOTE — Telephone Encounter (Signed)
 Returned call to patient after her husband Alm left a message on our VM that she is not feeling well and needed to know what to do. Patient stated she feels as if she has a blockage. She has tried for three days to go but not able to, she stated she is having belly pain with spasms and her abdomen is swollen. I spoke with Dr. Autumn, he stated she needs to go to the ER to have her abd scanned. Called the patient back and gave her his instructions and she voiced that she understood, she had no further questions at this time.

## 2024-05-11 NOTE — H&P (Addendum)
 History and Physical    Patient: Janice Pace FMW:[id number] DOB: May 04, 1946 DOA: 05/11/2024 DOS: the patient was seen and examined on 05/11/2024 PCP: Gayle Saddie FALCON, PA-C  Patient coming from: Home  Chief Complaint:  Chief Complaint  Patient presents with   Abdominal Pain   HPI: Janice Pace is a 78 y.o. 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  Underwent chemoimmunotherapy with gemcitabine , cisplatin  and durvalumab  completed for cycle restaging PET scan 03/22/19/2025 show overall improvement of the lesions in the [facility] lymph node and omentum.  Plan was to continue for up 8 cycle but she developed rash, immune-mediated dermatitis and odynophagia, diarrhea, chemo therapy on hold. present with uncontrollable abdominal pain.  Patient reports worsening abdominal pain started on Sunday, cramping pain, she feels like she needs to have a bowel movement but she is not able to.  She has  also  been having issues with urinating.  She has been doing sitz bath at home to assist with urination.  She has been eating some, denies nausea or vomiting, denies dysuria, fever.  Last bowel movement was on Sunday, loose normal stool.  She has not been passing any gas.  Supposed to follow-up with her oncologist tomorrow for lab work and to talk about resumption of chemotherapy for Friday.  Evaluation in the ED: Sodium 141, potassium 3.3, BUN 16, creatinine 1.3, alkaline phosphatase 175, AST 116, ALT 128, bilirubin 2.8, hemoglobin 9.3, platelets 125, white blood cell 6.3. CT abdomen and pelvis: No CT evidence of bowel obstruction.Hepatic metastatic disease with suspected new hepatic lesions in both hepatic lobes. 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 Stable scattered  omental implants near the liver and near the transverse colon. Stable cystic and solid mass in the central pelvis measuring 4.8 cm.      Review of Systems: As mentioned in the history of present illness. All other systems reviewed and are negative. Past Medical History:  Diagnosis Date   Arthritis    Asthma    Bronchitis    Cancer (HCC)    dermatosivros arcoma and protuverans   Carpal tunnel syndrome, left upper limb 11/06/2017   Chronic back pain    degenerative disc disease   Constipation    stool softener daily   Constipation    COPD (chronic obstructive pulmonary disease) (HCC)    GERD (gastroesophageal reflux disease)    takes Prilosec daily   GERD (gastroesophageal reflux disease)    Heart murmur    Heart valve disorder    Hemorrhoids    History of hiatal hernia    Ileus (HCC) 03/02/2021   Insomnia    d/t chantix    Joint pain    Nocturia    Ovarian failure 05/28/2022   Personal history of colonic adenoma 03/13/2003   03/13/2003 - 5 mm adenoma   Pre-diabetes    Prediabetes    White coat syndrome without diagnosis of hypertension    Past Surgical History:  Procedure Laterality Date   ABDOMINAL HYSTERECTOMY     ANTERIOR LAT LUMBAR FUSION N/A 02/27/2021   Procedure: Anterior lateral interbody fusion - lateral two - lateral three - lateral three - lateral four with exploration fusion L4-S1;  Surgeon: Onetha Kuba, MD;  Location: [facility] OR;  Service: Neurosurgery;  Laterality: N/A;   BACK SURGERY  1988   BACK SURGERY  09/03/2011   rods,screws x 8    BACK SURGERY     09/2022   BUNIONECTOMY     bilateral   BUNIONECTOMY     bil feet   COLONOSCOPY     dermatosibrosarcoma protuberans     IR IMAGING GUIDED PORT INSERTION  01/07/2024   LAMINECTOMY WITH POSTERIOR LATERAL ARTHRODESIS LEVEL 3 N/A 02/27/2021   Procedure: Posterior augmentation with pedicle screws Latreal one - lateral four - Posterior Lateral and Interbody fusion;  Surgeon: Onetha Kuba, MD;  Location: [facility] OR;   Service: Neurosurgery;  Laterality: N/A;   PARTIAL HYSTERECTOMY     RECTOCELE REPAIR     Rectum Repair     RESECTION TUMOR WRIST RADICAL     x 2 rt   TUBAL LIGATION  1974   TUBAL LIGATION     WRIST SURGERY     left   Social History:  reports that she quit smoking about 12 years ago. Her smoking use included cigarettes. She started smoking about 62 years ago. She has a 50 pack-year smoking history. She has never been exposed to tobacco smoke. She has never used smokeless tobacco. She reports that she does not currently use alcohol. She reports that she does not use drugs.  Allergies  Allergen Reactions   Sulfa Antibiotics Hives and Itching   Forteo  [Parathyroid Hormone (Recomb)] Other (See Comments)    Too many side effects   Atorvastatin Other (See Comments)    Muscle aches   Metformin Hcl Other (See Comments)    Joint pains and muscle aches    Family History  Problem Relation Age of Onset   Leukemia Mother    Anesthesia problems Neg Hx    Hypotension Neg Hx    Malignant hyperthermia Neg Hx    Pseudochol deficiency Neg Hx    Colon cancer Neg Hx    Colon polyps Neg Hx    Esophageal cancer Neg Hx    Stomach cancer Neg Hx    Rectal cancer Neg Hx     Prior to Admission medications   Medication Sig Start Date End Date Taking? Authorizing Provider  albuterol  (VENTOLIN  HFA) 108 (90 Base) MCG/ACT inhaler Inhale 1-2 puffs into the lungs every 6 (six) hours as needed for wheezing or shortness of breath. 04/11/24   Kara Dorn NOVAK, MD  aspirin  81 MG EC tablet Take 81 mg by mouth daily.    [provider]  budesonide -glycopyrrolate -formoterol  (BREZTRI  AEROSPHERE) 160-9-4.8 MCG/ACT AERO inhaler Inhale 2 puffs into the lungs in the morning and at bedtime. 04/11/24   Kara Dorn NOVAK, MD  dexamethasone  (DECADRON ) 4 MG tablet Take 2 tablets (8 mg) by mouth daily x 3 days starting the day after cisplatin  chemotherapy. Take with food. 12/24/23   Pasam, Chinita, MD  docusate  sodium (COLACE) 100 MG capsule Take 500 mg by mouth at bedtime.    [provider]  fluconazole  (DIFLUCAN ) 200 MG tablet Take 1 tablet (200 mg total) by mouth daily. 04/26/24   Pasam, Chinita, MD  lidocaine -prilocaine  (EMLA ) cream Apply to affected area once 12/24/23   Pasam, Avinash, MD  losartan (COZAAR) 50 MG tablet Take 1 tablet (50 mg total) by mouth daily. 05/09/24   Gayle Saddie FALCON, PA-C  magic mouthwash (nystatin, lidocaine , diphenhydrAMINE , alum & mag hydroxide) suspension Take 5 mLs by mouth every 4 (four) hours as needed for mouth pain. Suspension contains equal amounts of Maalox, nystatin, diphenhydramine  and lidocaine . 1:1:1:1 ratio. 04/28/24   Autumn Chinita, MD  magnesium   oxide (MAG-OX) 400 (240 Mg) MG tablet Take 1 tablet (400 mg total) by mouth 2 (two) times daily. 05/04/24   Pasam, Chinita, MD  megestrol  (MEGACE  ES) 625 MG/5ML suspension Take 5 mLs (625 mg total) by mouth daily. 01/27/24   Pasam, Chinita, MD  methylPREDNISolone  (MEDROL  DOSEPAK) 4 MG TBPK tablet Take as directed on package instructions 04/22/24   Pasam, Avinash, MD  metoprolol  succinate (TOPROL -XL) 50 MG 24 hr tablet TAKE 1 TABLET BY MOUTH ONCE DAILY. TAKE WITH OR IMMEDIATELY FOLLOWING A MEAL 11/11/23   Chandra Toribio POUR, MD  morphine  (MS CONTIN ) 15 MG 12 hr tablet Take 1 tablet (15 mg total) by mouth every 12 (twelve) hours. 05/06/24   Pasam, Chinita, MD  nystatin (MYCOSTATIN) 100000 UNIT/ML suspension Take 5 mLs by mouth 4 (four) times daily. 04/21/24   [provider]  omeprazole  (PRILOSEC) 20 MG capsule Take 1 capsule (20 mg total) by mouth daily. 05/02/24   Gayle Saddie FALCON, PA-C  ondansetron  (ZOFRAN ) 4 MG tablet Take 1 tablet (4 mg total) by mouth every 6 (six) hours as needed for nausea. Patient not taking: Reported on 05/09/2024 12/16/23   Laurence Locus, DO  ondansetron  (ZOFRAN ) 8 MG tablet Take 1 tablet (8 mg total) by mouth every 8 (eight) hours as needed for nausea or vomiting. Start on the third day after  cisplatin . 12/24/23   Pasam, Chinita, MD  oxyCODONE  10 MG TABS Take 1-1.5 tablets (10-15 mg total) by mouth every 4 (four) hours as needed for moderate pain (pain score 4-6) or severe pain (pain score 7-10). 12/16/23   Laurence Locus, DO  polyethylene glycol (MIRALAX  / GLYCOLAX ) 17 g packet Take 17 g by mouth 2 (two) times daily.    [provider]  prochlorperazine  (COMPAZINE ) 10 MG tablet Take 1 tablet (10 mg total) by mouth every 6 (six) hours as needed (Nausea or vomiting). 12/24/23   Pasam, Chinita, MD  rosuvastatin  (CRESTOR ) 10 MG tablet Take 1 tablet by mouth once daily 02/18/24   Gayle Saddie F, PA-C  spironolactone  (ALDACTONE ) 25 MG tablet TAKE 1 TABLET BY MOUTH ONCE DAILY IN THE MORNING 05/06/24   Ladona Heinz, MD    Physical Exam: Vitals:   05/11/24 1225 05/11/24 1315 05/11/24 1630 05/11/24 1649  BP: (!) 171/100 (!) 172/63 (!) 166/69 (!) 154/84  Pulse: 80 66 68 67  Resp: 18   18  Temp: 98.6 F (37 C)   98.6 F (37 C)  TempSrc: Oral     SpO2: 100% 100% 100% 98%   General; appearing comfortable, no acute distress CVS; S1, S2 regular rhythm and rate Lung: Normal respiratory effort, decreased lung sounds Abdomen: Bowel sounds present, soft, distended, tender to the right upper quadrant and lower abdomen Extremities: No edema Neurology: Alert conversant following command Skin: Erythematous rash  Data Reviewed:  Labs reviewed.   Assessment and Plan: No notes have been filed under this hospital service. Service: Hospitalist  Abdominal Pain ; acute on chronic In the setting of metastatic stage IV gallbladder cancer Also rule out urine retention and constipation Patient presented with worsening abdominal pain, inability to urinate or have a bowel movement since Sunday. CT abdomen and pelvis: No evidence of bowel obstruction.  Hepatic metastatic disease with suspected new hepatic lesion in both hepatic lobes.  Stable changes related to gallbladder cancer with involvement of the  left hepatic lobe.  Stable scattered omental implants near the liver and near the transverse colon.  Stable cystic and solid mass in the  central pelvis measuring 4.8 cm -Plan to admit for pain control, IV fluids -MS Contin .  IV morphine  as needed resume oxycodone  as needed - Oncology consulted - Dulcolax suppository ordered.  As needed Robaxin   Transaminases, Hyperbilirubinemia: In the setting of hepatic metastatic disease, CT unable to evaluate bile ducts. -Will proceed with right upper quadrant ultrasound to further evaluate -Repeat labs in the morning  -Hypertension: Hold lisinopril in the setting of AKI.  As needed hydralazine  ordered  AKI:  Concern for urinary retention In the setting of poor oral intake and possible urine retention Continue with IV fluids Repeat renal function in the morning  COPD: Continue Breztri  inhaler twice daily as needed nebulizer  Constipation: Continue MiraLAX , Senokot.  Dulcolax suppository ordered\ Oral thrush: Awaiting med rec to resume nystatin Dermatitis the setting of recent immuno chemotherapy.  Improving Hypokalemia; replete orally.    Advance Care Planning:   Code Status: Prior wishes to be DNR  Consults: Oncology to see patient in the morning will add primary oncology to rounding team  Family Communication: Husband at bedside.   Severity of Illness: The appropriate patient status for this patient is OBSERVATION. Observation status is judged to be reasonable and necessary in order to provide the required intensity of service to ensure the patient's safety. The patient's presenting symptoms, physical exam findings, and initial radiographic and laboratory data in the context of their medical condition is felt to place them at decreased risk for further clinical deterioration. Furthermore, it is anticipated that the patient will be medically stable for discharge from the hospital within 2 midnights of admission.   Author: Owen DELENA Lore,  MD 05/11/2024 5:29 PM  For on call review [email or web address].

## 2024-05-11 NOTE — ED Notes (Signed)
 Patient reports significant decrease in discomfort with some relief after catheter placed

## 2024-05-11 NOTE — ED Notes (Signed)
 Pt unable to urinate at this time.

## 2024-05-11 NOTE — ED Notes (Addendum)
 Bladder scan performed . MD @ bedside request foley catheter insertion for retention

## 2024-05-11 NOTE — ED Provider Notes (Signed)
 [place] EMERGENCY DEPARTMENT AT [facility] Provider Note   CSN: [id number] Arrival date & time: 05/11/24  1148     Patient presents with: Abdominal Pain   Janice Pace is a 78 y.o. female.  {Add pertinent medical, surgical, social history, OB history to HPI:3030} 78 year old female with prior medical history as detailed below presents for evaluation.  Patient complains of diffuse abdominal pain x 4 days.  She reports her last bowel movement was 2 days ago.  She denies any vomiting.  She does report nausea.  She denies fever.  She denies prior history of bowel obstruction.  She reports that she is currently undergoing radiation therapy for prior history of gallbladder cancer.  Prior surgical history on the abdomen includes hysterectomy and tubal ligation.  Past medical history includes essential hypertension, osteoarthritis, COPD/asthma, GERD, depression, anxiety, hiatal hernia, chronic back pain, Stage IV adenocarcinoma of the gallbladder with peritoneal metastasis and metastatic lung disease.   The history is provided by the patient and medical records.       Prior to Admission medications   Medication Sig Start Date End Date Taking? Authorizing Provider  albuterol  (VENTOLIN  HFA) 108 (90 Base) MCG/ACT inhaler Inhale 1-2 puffs into the lungs every 6 (six) hours as needed for wheezing or shortness of breath. 04/11/24   Kara Dorn NOVAK, MD  aspirin  81 MG EC tablet Take 81 mg by mouth daily.    [provider]  budesonide -glycopyrrolate -formoterol  (BREZTRI  AEROSPHERE) 160-9-4.8 MCG/ACT AERO inhaler Inhale 2 puffs into the lungs in the morning and at bedtime. 04/11/24   Kara Dorn NOVAK, MD  dexamethasone  (DECADRON ) 4 MG tablet Take 2 tablets (8 mg) by mouth daily x 3 days starting the day after cisplatin  chemotherapy. Take with food. 12/24/23   Pasam, Chinita, MD  docusate sodium  (COLACE) 100 MG capsule Take 500 mg by mouth at bedtime.    [provider]  fluconazole  (DIFLUCAN ) 200 MG tablet Take 1 tablet (200 mg total) by mouth daily. 04/26/24   Pasam, Chinita, MD  lidocaine -prilocaine  (EMLA ) cream Apply to affected area once 12/24/23   Pasam, Avinash, MD  losartan (COZAAR) 50 MG tablet Take 1 tablet (50 mg total) by mouth daily. 05/09/24   Gayle Saddie FALCON, PA-C  magic mouthwash (nystatin, lidocaine , diphenhydrAMINE , alum & mag hydroxide) suspension Take 5 mLs by mouth every 4 (four) hours as needed for mouth pain. Suspension contains equal amounts of Maalox, nystatin, diphenhydramine  and lidocaine . 1:1:1:1 ratio. 04/28/24   Pasam, Chinita, MD  magnesium  oxide (MAG-OX) 400 (240 Mg) MG tablet Take 1 tablet (400 mg total) by mouth 2 (two) times daily. 05/04/24   Pasam, Chinita, MD  megestrol  (MEGACE  ES) 625 MG/5ML suspension Take 5 mLs (625 mg total) by mouth daily. 01/27/24   Pasam, Chinita, MD  methylPREDNISolone  (MEDROL  DOSEPAK) 4 MG TBPK tablet Take as directed on package instructions 04/22/24   Pasam, Avinash, MD  metoprolol  succinate (TOPROL -XL) 50 MG 24 hr tablet TAKE 1 TABLET BY MOUTH ONCE DAILY. TAKE WITH OR IMMEDIATELY FOLLOWING A MEAL 11/11/23   Chandra Toribio POUR, MD  morphine  (MS CONTIN ) 15 MG 12 hr tablet Take 1 tablet (15 mg total) by mouth every 12 (twelve) hours. 05/06/24   Pasam, Chinita, MD  nystatin (MYCOSTATIN) 100000 UNIT/ML suspension Take 5 mLs by mouth 4 (four) times daily. 04/21/24   [provider]  omeprazole  (PRILOSEC) 20 MG capsule Take 1 capsule (20 mg total) by mouth daily. 05/02/24   Gayle Saddie FALCON, PA-C  ondansetron  (ZOFRAN ) 4 MG tablet Take 1 tablet (4 mg total) by mouth every 6 (six) hours as needed for nausea. Patient not taking: Reported on 05/09/2024 12/16/23   Laurence Locus, DO  ondansetron  (ZOFRAN ) 8 MG tablet Take 1 tablet (8 mg total) by mouth every 8 (eight) hours as needed for nausea or vomiting. Start on the third day after cisplatin . 12/24/23   Pasam, Chinita, MD  oxyCODONE  10 MG TABS Take 1-1.5 tablets (10-15  mg total) by mouth every 4 (four) hours as needed for moderate pain (pain score 4-6) or severe pain (pain score 7-10). 12/16/23   Laurence Locus, DO  polyethylene glycol (MIRALAX  / GLYCOLAX ) 17 g packet Take 17 g by mouth 2 (two) times daily.    [provider]  prochlorperazine  (COMPAZINE ) 10 MG tablet Take 1 tablet (10 mg total) by mouth every 6 (six) hours as needed (Nausea or vomiting). 12/24/23   Pasam, Chinita, MD  rosuvastatin  (CRESTOR ) 10 MG tablet Take 1 tablet by mouth once daily 02/18/24   Clapp, Kara F, PA-C  spironolactone  (ALDACTONE ) 25 MG tablet TAKE 1 TABLET BY MOUTH ONCE DAILY IN THE MORNING 05/06/24   Ladona Heinz, MD    Allergies: Sulfa antibiotics, Forteo  [parathyroid hormone (recomb)], Atorvastatin, and Metformin hcl    Review of Systems  All other systems reviewed and are negative.   Updated Vital Signs BP (!) 171/100 (BP Location: Left Arm)   Pulse 80   Temp 98.6 F (37 C) (Oral)   Resp 18   SpO2 100%   Physical Exam Vitals and nursing note reviewed.  Constitutional:      General: She is not in acute distress.    Appearance: She is well-developed.  HENT:     Head: Normocephalic and atraumatic.  Eyes:     Conjunctiva/sclera: Conjunctivae normal.  Cardiovascular:     Rate and Rhythm: Normal rate and regular rhythm.     Heart sounds: No murmur heard. Pulmonary:     Effort: Pulmonary effort is normal. No respiratory distress.     Breath sounds: Normal breath sounds.  Abdominal:     Palpations: Abdomen is soft.     Tenderness: There is generalized abdominal tenderness.  Musculoskeletal:        General: No swelling.     Cervical back: Neck supple.  Skin:    General: Skin is warm and dry.     Capillary Refill: Capillary refill takes less than 2 seconds.  Neurological:     Mental Status: She is alert.  Psychiatric:        Mood and Affect: Mood normal.     (all labs ordered are listed, but only abnormal results are displayed) Labs Reviewed  CBC WITH  DIFFERENTIAL/PLATELET  COMPREHENSIVE METABOLIC PANEL WITH GFR  URINALYSIS, ROUTINE W REFLEX MICROSCOPIC  LIPASE, BLOOD    EKG: None  Radiology: No results found.  {Document cardiac monitor, telemetry assessment procedure when appropriate:32947} Procedures   Medications Ordered in the ED  sodium chloride  0.9 % bolus 500 mL (has no administration in time range)  morphine  (PF) 4 MG/ML injection 4 mg (has no administration in time range)  ondansetron  (ZOFRAN ) injection 4 mg (has no administration in time range)  oxyCODONE -acetaminophen  (PERCOCET/ROXICET) 5-325 MG per tablet 1 tablet (1 tablet Oral Given 05/11/24 1250)      {Click here for ABCD2, HEART and other calculators REFRESH Note before signing:1}  Medical Decision Making Risk Prescription drug management.   ***  {Document critical care time when appropriate  Document review of labs and clinical decision tools ie CHADS2VASC2, etc  Document your independent review of radiology images and any outside records  Document your discussion with family members, caretakers and with consultants  Document social determinants of health affecting pt's care  Document your decision making why or why not admission, treatments were needed:32947:::1}   Final diagnoses:  None    ED Discharge Orders     None

## 2024-05-12 ENCOUNTER — Inpatient Hospital Stay: Admitting: Oncology

## 2024-05-12 ENCOUNTER — Other Ambulatory Visit: Payer: Self-pay

## 2024-05-12 ENCOUNTER — Inpatient Hospital Stay

## 2024-05-12 DIAGNOSIS — R7989 Other specified abnormal findings of blood chemistry: Secondary | ICD-10-CM | POA: Diagnosis not present

## 2024-05-12 DIAGNOSIS — E785 Hyperlipidemia, unspecified: Secondary | ICD-10-CM | POA: Diagnosis present

## 2024-05-12 DIAGNOSIS — I10 Essential (primary) hypertension: Secondary | ICD-10-CM | POA: Diagnosis present

## 2024-05-12 DIAGNOSIS — R7303 Prediabetes: Secondary | ICD-10-CM | POA: Diagnosis present

## 2024-05-12 DIAGNOSIS — R1011 Right upper quadrant pain: Secondary | ICD-10-CM | POA: Diagnosis not present

## 2024-05-12 DIAGNOSIS — R109 Unspecified abdominal pain: Secondary | ICD-10-CM | POA: Diagnosis not present

## 2024-05-12 DIAGNOSIS — B370 Candidal stomatitis: Secondary | ICD-10-CM | POA: Diagnosis present

## 2024-05-12 DIAGNOSIS — Z7982 Long term (current) use of aspirin: Secondary | ICD-10-CM | POA: Diagnosis not present

## 2024-05-12 DIAGNOSIS — I81 Portal vein thrombosis: Secondary | ICD-10-CM | POA: Diagnosis present

## 2024-05-12 DIAGNOSIS — K5909 Other constipation: Secondary | ICD-10-CM | POA: Diagnosis present

## 2024-05-12 DIAGNOSIS — N179 Acute kidney failure, unspecified: Secondary | ICD-10-CM | POA: Diagnosis present

## 2024-05-12 DIAGNOSIS — Z86718 Personal history of other venous thrombosis and embolism: Secondary | ICD-10-CM | POA: Diagnosis not present

## 2024-05-12 DIAGNOSIS — E876 Hypokalemia: Secondary | ICD-10-CM | POA: Diagnosis present

## 2024-05-12 DIAGNOSIS — Z66 Do not resuscitate: Secondary | ICD-10-CM | POA: Diagnosis present

## 2024-05-12 DIAGNOSIS — K219 Gastro-esophageal reflux disease without esophagitis: Secondary | ICD-10-CM | POA: Diagnosis present

## 2024-05-12 DIAGNOSIS — C801 Malignant (primary) neoplasm, unspecified: Secondary | ICD-10-CM | POA: Diagnosis not present

## 2024-05-12 DIAGNOSIS — C786 Secondary malignant neoplasm of retroperitoneum and peritoneum: Secondary | ICD-10-CM | POA: Diagnosis present

## 2024-05-12 DIAGNOSIS — R935 Abnormal findings on diagnostic imaging of other abdominal regions, including retroperitoneum: Secondary | ICD-10-CM | POA: Diagnosis present

## 2024-05-12 DIAGNOSIS — D630 Anemia in neoplastic disease: Secondary | ICD-10-CM | POA: Diagnosis present

## 2024-05-12 DIAGNOSIS — C787 Secondary malignant neoplasm of liver and intrahepatic bile duct: Secondary | ICD-10-CM | POA: Diagnosis present

## 2024-05-12 DIAGNOSIS — Z79899 Other long term (current) drug therapy: Secondary | ICD-10-CM | POA: Diagnosis not present

## 2024-05-12 DIAGNOSIS — D6959 Other secondary thrombocytopenia: Secondary | ICD-10-CM | POA: Diagnosis present

## 2024-05-12 DIAGNOSIS — K7460 Unspecified cirrhosis of liver: Secondary | ICD-10-CM | POA: Diagnosis present

## 2024-05-12 DIAGNOSIS — K831 Obstruction of bile duct: Secondary | ICD-10-CM | POA: Diagnosis present

## 2024-05-12 DIAGNOSIS — C23 Malignant neoplasm of gallbladder: Secondary | ICD-10-CM | POA: Diagnosis present

## 2024-05-12 DIAGNOSIS — K760 Fatty (change of) liver, not elsewhere classified: Secondary | ICD-10-CM | POA: Diagnosis present

## 2024-05-12 DIAGNOSIS — J4489 Other specified chronic obstructive pulmonary disease: Secondary | ICD-10-CM | POA: Diagnosis present

## 2024-05-12 DIAGNOSIS — R188 Other ascites: Secondary | ICD-10-CM | POA: Diagnosis present

## 2024-05-12 DIAGNOSIS — C7800 Secondary malignant neoplasm of unspecified lung: Secondary | ICD-10-CM | POA: Diagnosis present

## 2024-05-12 LAB — COMPREHENSIVE METABOLIC PANEL WITH GFR
ALT: 123 U/L — ABNORMAL HIGH (ref 0–44)
AST: 105 U/L — ABNORMAL HIGH (ref 15–41)
Albumin: 3.2 g/dL — ABNORMAL LOW (ref 3.5–5.0)
Alkaline Phosphatase: 174 U/L — ABNORMAL HIGH (ref 38–126)
Anion gap: 13 (ref 5–15)
BUN: 13 mg/dL (ref 8–23)
CO2: 21 mmol/L — ABNORMAL LOW (ref 22–32)
Calcium: 8.4 mg/dL — ABNORMAL LOW (ref 8.9–10.3)
Chloride: 106 mmol/L (ref 98–111)
Creatinine, Ser: 0.88 mg/dL (ref 0.44–1.00)
GFR, Estimated: >60 mL/min (ref >60)
Glucose, Bld: 100 mg/dL — ABNORMAL HIGH (ref 70–99)
Potassium: 3.3 mmol/L — ABNORMAL LOW (ref 3.5–5.1)
Sodium: 141 mmol/L (ref 135–145)
Total Bilirubin: 3.9 mg/dL — ABNORMAL HIGH (ref 0.0–1.2)
Total Protein: 5.3 g/dL — ABNORMAL LOW (ref 6.5–8.1)

## 2024-05-12 LAB — CBC
HCT: 27 % — ABNORMAL LOW (ref 36.0–46.0)
Hemoglobin: 8.7 g/dL — ABNORMAL LOW (ref 12.0–15.0)
MCH: 31.6 pg (ref 26.0–34.0)
MCHC: 32.2 g/dL (ref 30.0–36.0)
MCV: 98.2 fL (ref 80.0–100.0)
Platelets: 136 K/uL — ABNORMAL LOW (ref 150–400)
RBC: 2.75 MIL/uL — ABNORMAL LOW (ref 3.87–5.11)
RDW: 21.4 % — ABNORMAL HIGH (ref 11.5–15.5)
WBC: 6.8 K/uL (ref 4.0–10.5)
nRBC: 0 % (ref 0.0–0.2)

## 2024-05-12 MED ORDER — ENSURE PLUS HIGH PROTEIN PO LIQD
237.0000 mL | Freq: Two times a day (BID) | ORAL | Status: DC
  Administered 2024-05-13 – 2024-05-20 (×6): 237 mL via ORAL

## 2024-05-12 MED ORDER — LORAZEPAM 2 MG/ML IJ SOLN
0.5000 mg | Freq: Four times a day (QID) | INTRAMUSCULAR | Status: DC | PRN
  Administered 2024-05-16: 0.5 mg via INTRAVENOUS
  Filled 2024-05-12: qty 1

## 2024-05-12 MED ORDER — OXYCODONE HCL 5 MG PO TABS
10.0000 mg | ORAL_TABLET | ORAL | Status: DC | PRN
  Administered 2024-05-12 – 2024-05-13 (×3): 15 mg via ORAL
  Administered 2024-05-14: 10 mg via ORAL
  Administered 2024-05-17: 15 mg via ORAL
  Administered 2024-05-17: 10 mg via ORAL
  Administered 2024-05-19 – 2024-05-21 (×7): 15 mg via ORAL
  Filled 2024-05-12: qty 3
  Filled 2024-05-12: qty 2
  Filled 2024-05-12 (×8): qty 3
  Filled 2024-05-12: qty 2
  Filled 2024-05-12 (×2): qty 3

## 2024-05-12 MED ORDER — POTASSIUM CHLORIDE CRYS ER 20 MEQ PO TBCR
40.0000 meq | EXTENDED_RELEASE_TABLET | Freq: Once | ORAL | Status: AC
  Administered 2024-05-12: 40 meq via ORAL
  Filled 2024-05-12: qty 2

## 2024-05-12 MED ORDER — FLEET ENEMA RE ENEM
1.0000 | ENEMA | Freq: Once | RECTAL | Status: DC
  Filled 2024-05-12: qty 1

## 2024-05-12 MED ORDER — MELATONIN 3 MG PO TABS
3.0000 mg | ORAL_TABLET | Freq: Every evening | ORAL | Status: DC | PRN
  Administered 2024-05-12 – 2024-05-16 (×3): 3 mg via ORAL
  Filled 2024-05-12 (×3): qty 1

## 2024-05-12 NOTE — Progress Notes (Addendum)
 ADDENDUM:  Patient was personally and independently interviewed, examined and relevant elements of the history of present illness were reviewed in details and an assessment and plan was created. All elements of the patient's history of present illness, assessment and plan were discussed in detail with Olam JINNY Brunner, NP. The above documentation reflects our combined findings assessment and plan.   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  She was tolerating treatments well up until 3 weeks ago when she started having some swallowing difficulty, clinical picture concerning for esophageal candidiasis and a skin rash which led to a break in systemic treatments  Yesterday she called our clinic with complaints of abdominal pain and what seemed to be obstipation.  She was directed to the ED for further evaluation.  CT abdomen and pelvis on 05/11/2024 showed suspected new hepatic lesions in both hepatic lobes, mild central intrahepatic biliary dilatation.  Necrotic appearing periportal nodal disease may be slightly larger at 3 x 2.8 cm, compared to 2.9 x 2.7 cm previously.  There may be slight narrowing of the adjacent main portal vein and there also appears to be significant new stenosis of the left portal vein.  MRI/MRCP would be helpful for further evaluation.  Bilirubin has been slowly trending upwards.  Previously normal at 0.5 when we saw her in clinic.  It was 3.9 today.  Open phosphatase, AST ALT are also elevated compared to last week.  Picture concerning for biliary obstruction.  Please proceed with MRI/MRCP for further evaluation.  Depending on findings, we will have to get either gastroenterology or interventional radiology involved for further management.  I will be out of office tomorrow and I will check out to one of my  colleagues/ Dr. Cloretta for follow-up.  We greatly appreciate hospitalist team assistance in her management.  Janice Pace   DOB:January 24, 1946   FM#:[id number]      ASSESSMENT & PLAN:  Janice Pace is a 78 year old female patient with oncologic history significant for gallbladder cancer.  She came to ED on 10/29 with complaints of abdominal pain.  Medical Oncology/Dr. Autumn following.  Abdominal pain -- Secondary to known malignancy -- Patient complains of progressively worsening abdominal pain. -- CT 10/29 did not show evidence of bowel obstruction.  -- Continue pain meds per orders.   Adenocarcinoma of Gallbladder with peritoneal and lung mets Peritoneal carcinomatosis -- Diagnosed 12/15/23. Path of omental biopsy showed metastatic well to moderately differentiated adenocarcinoma -- Status post chemo/immunotherapy with gemcitabine , ciplatin, and durvalumab  x4 cycles.   -- Recent Pet scan done 9/8 showed improvement. -- Plan was to continue chemo/immunotherapy for up to 8 cycles; 6 cycles if cytopenias or other side effects.  -- CT 10/29 shows new hepatic lesions in both hepatic lobes. Also shows stable scattered omental implants near liver and near transverse colon.  -- Medical Oncology/Dr. Maryclare Nydam following closely  Cutaneous rash -- Patient with generalized rash over face, bilateral upper and lower extremities, chest and back secondary to immunotherapy.  Does not itch per patient and is improving. -- Continue to monitor closely.  Dysuria -- Foley cath placed this admission.  Draining well. -- Monitor closely  Anemia, normocytic Thrombocytopenia  -- HGB low 8.7 -- Platelets slightly low 136K -- No transfusional intervention required at this time -- Continue to monitor CBC with differential  Hyperbilirubinemia Transaminitis cirrhosis -- May be due to  malignancy and cirrhosis -- US  liver protocol done 10/29 shows cirrhosis -- Bilirubin increased to 3.9 -- Elevated LFTs  noted -- Avoid hepatotoxic agents   Code Status DNR-Limited  Subjective:  Patient seen awake and alert laying in bed. Husband at bedside.  States that abdominal pain is slightly better and has eased up. Complains of diffuse rash over face, bilateral arms, chest, back and bilateral lower extremities.    Objective:   Intake/Output Summary (Last 24 hours) at 05/12/2024 0953 Last data filed at 05/12/2024 0510 Gross per 24 hour  Intake 1107.58 ml  Output 875 ml  Net 232.58 ml     PHYSICAL EXAMINATION: ECOG PERFORMANCE STATUS: 3 - Symptomatic, >50% confined to bed  Vitals:   05/12/24 0533 05/12/24 0813  BP: (!) 148/76 (!) 187/84  Pulse: 87 91  Resp: 18 20  Temp: 98.2 F (36.8 C)   SpO2: 97% 98%   Filed Weights   05/11/24 2101  Weight: 159 lb (72.1 kg)    GENERAL: alert, no distress and comfortable SKIN: +generalized rash face, chest, back, bilateral UE and LE EYES: normal, conjunctiva are pink and non-injected, sclera clear OROPHARYNX: no exudate, no erythema and lips, buccal mucosa, and tongue normal  NECK: supple, thyroid  normal size, non-tender, without nodularity LYMPH: no palpable lymphadenopathy in the cervical, axillary or inguinal LUNGS: clear to auscultation and percussion with normal breathing effort HEART: regular rate & rhythm and no murmurs and no lower extremity edema ABDOMEN: +abdomen tender and normal bowel sounds MUSCULOSKELETAL: no cyanosis of digits and no clubbing  PSYCH: alert & oriented x 3 with fluent speech NEURO: no focal motor/sensory deficits   All questions were answered. The patient knows to call the clinic with any problems, questions or concerns.   The total time spent in the appointment was 40 minutes encounter with patient including review of chart and various tests results, discussions about plan of care and coordination of care plan  Olam JINNY Brunner, NP 05/12/2024 9:53 AM    Labs Reviewed:  Lab Results  Component Value Date    WBC 6.8 05/12/2024   HGB 8.7 (L) 05/12/2024   HCT 27.0 (L) 05/12/2024   MCV 98.2 05/12/2024   PLT 136 (L) 05/12/2024   Recent Labs    05/06/24 0832 05/11/24 1445 05/12/24 0442  NA 142 141 141  K 3.3* 3.3* 3.3*  CL 108 106 106  CO2 27 23 21*  GLUCOSE 155* 95 100*  BUN 20 16 13   CREATININE 1.03* 1.36* 0.88  CALCIUM  8.5* 8.6* 8.4*  GFRNONAA 56* 40* >60  PROT 5.2* 5.6* 5.3*  ALBUMIN  3.0* 3.4* 3.2*  AST 34 116* 105*  ALT 38 128* 123*  ALKPHOS 117 175* 174*  BILITOT 0.5 2.8* 3.9*    Studies Reviewed:  PET SCAN 03/21/2024 IMPRESSION: 1. Minimal response to therapy, as evidenced by decreased size and hypermetabolism of Pulmonary Metastasis, gallbladder primary and abdominal nodal metastasis. Omental and left pelvic peritoneal/adnexal implants are relatively similar. 2.  No new sites of disease. 3. Anorectal hypermetabolism is most likely physiologic 4. Incidental findings, including: Aortic atherosclerosis ([id number]). Coronary artery atherosclerosis.  CT SCAN 05/11/2024  IMPRESSION: 1. No CT evidence of bowel obstruction. 2. Hepatic metastatic disease with suspected new hepatic lesions in both hepatic lobes. 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 3. Stable scattered omental implants near the liver and near the transverse colon. 4. Stable cystic and solid mass in the central pelvis measuring 4.8 cm.  US  Liver 05/11/24  IMPRESSION: 1. Cirrhosis. No focal liver lesions identified. Liver protocol contrast-enhanced MR or CT are most sensitive for HCC screening in cirrhosis. 2. Gallbladder wall not visualized.

## 2024-05-12 NOTE — Plan of Care (Signed)
  Problem: Education: Goal: Knowledge of General Education information will improve Description: Including pain rating scale, medication(s)/side effects and non-pharmacologic comfort measures Outcome: Progressing   Problem: Health Behavior/Discharge Planning: Goal: Ability to manage health-related needs will improve Outcome: Progressing   Problem: Activity: Goal: Risk for activity intolerance will decrease Outcome: Progressing   Problem: Nutrition: Goal: Adequate nutrition will be maintained Outcome: Progressing   Problem: Elimination: Goal: Will not experience complications related to urinary retention Outcome: Progressing   Problem: Pain Managment: Goal: General experience of comfort will improve and/or be controlled Outcome: Progressing   Problem: Safety: Goal: Ability to remain free from injury will improve Outcome: Progressing

## 2024-05-12 NOTE — Plan of Care (Signed)
  Problem: Education: Goal: Knowledge of General Education information will improve Description: Including pain rating scale, medication(s)/side effects and non-pharmacologic comfort measures Outcome: Progressing   Problem: Health Behavior/Discharge Planning: Goal: Ability to manage health-related needs will improve Outcome: Progressing   Problem: Clinical Measurements: Goal: Ability to maintain clinical measurements within normal limits will improve Outcome: Progressing Goal: Will remain free from infection Outcome: Progressing Goal: Diagnostic test results will improve Outcome: Progressing Goal: Respiratory complications will improve Outcome: Progressing Goal: Cardiovascular complication will be avoided Outcome: Progressing   Problem: Activity: Goal: Risk for activity intolerance will decrease Outcome: Progressing   Problem: Elimination: Goal: Will not experience complications related to bowel motility Outcome: Progressing   Problem: Pain Managment: Goal: General experience of comfort will improve and/or be controlled Outcome: Progressing   Problem: Safety: Goal: Ability to remain free from injury will improve Outcome: Progressing   Problem: Skin Integrity: Goal: Risk for impaired skin integrity will decrease Outcome: Progressing

## 2024-05-12 NOTE — Plan of Care (Signed)
  Problem: Clinical Measurements: Goal: Will remain free from infection Outcome: Progressing   Problem: Activity: Goal: Risk for activity intolerance will decrease Outcome: Progressing   Problem: Pain Managment: Goal: General experience of comfort will improve and/or be controlled Outcome: Progressing   Problem: Safety: Goal: Ability to remain free from injury will improve Outcome: Progressing   Problem: Skin Integrity: Goal: Risk for impaired skin integrity will decrease Outcome: Progressing

## 2024-05-12 NOTE — Progress Notes (Signed)
 PROGRESS NOTE    Janice Pace  FMW:[id number] DOB: 1946/01/29 DOA: 05/11/2024 PCP: Gayle Saddie Janice Pace   Brief Narrative: 78 year old with past medical history significant for asthma, COPD, GERD, prediabetes, stage IV adenocarcinoma of gallbladder with peritoneal metastasis and lung metastasis, undergoing chemoimmunotherapy, presented with worsening abdominal pain that started on Sunday, cramping pain feels like she cannot have a bowel movement or able to urinate.  CT abdomen and pelvis was negative for obstruction but showed 2 new lesion in the hepatic lobe.  Presented with transaminases.   Assessment & Plan:   Active Problems:   Abdominal pain  1-Abdominal Pain ; acute on chronic In the setting of metastatic stage IV gallbladder cancer Also rule out urine retention and constipation -Patient presented with worsening abdominal pain, inability to urinate or have a bowel movement since Sunday. -CT abdomen and pelvis: No evidence of bowel obstruction.  Hepatic metastatic disease with suspected new hepatic lesion in both hepatic lobes.  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  Stable scattered omental implants near the liver and near the transverse colon.  Stable cystic and solid mass in the central pelvis measuring 4.8 cm -Continue with MS Contin ,  oxycodone  as needed and IV morphine  PRN - Oncology consulted - Continue with Robaxin .  Felt some relieved yesterday after foley catheter placement. Had small BM last night.  Plan for enema, continue pain management.    Transaminases, Hyperbilirubinemia: In the setting of hepatic metastatic disease, CT unable to evaluate bile ducts. -US  showed cirrhosis, gallbladder wall not visualized Fatty liver function test, could be in the setting of metastatic disease. Follow oncology recommendation Per radiology report there could be decrease size of portal vein , will proceed with MRCP  -Hypertension:  Continue to hold lisinopril in the setting of AKI.  As needed hydralazine  ordered   AKI:  Concern for urinary retention In the setting of poor oral intake and possible urine retention Continue with IV fluids Improved with IV fluids  Hypokalemia: Replace orally COPD: Continue Breztri  inhaler twice daily as needed nebulizer   Constipation: Continue MiraLAX , Senokot. Enema today  Oral thrush: Awaiting med rec to resume nystatin Dermatitis the setting of recent immuno chemotherapy.  Improving      Estimated body mass index is 29.08 kg/m as calculated from the following:   Height as of this encounter: 5' 2 (1.575 m).   Weight as of this encounter: 72.1 kg.   DVT prophylaxis: Lovenox  Code Status: DNR Family Communication: Husband who was at bedside Disposition Plan:  Status is: Observation The patient will require care spanning > 2 midnights and should be moved to inpatient because: Agement of pain    Consultants:  Oncology   Procedures:  US   Antimicrobials:    Subjective: She is alert, still having a lot of pain, she felt some relief after Foley catheter was placed yesterday.  Objective: Vitals:   05/11/24 2320 05/11/24 2325 05/12/24 0046 05/12/24 0533  BP: (!) 168/70 (!) 168/70 (!) 137/58 (!) 148/76  Pulse: 77  83 87  Resp:   18 18  Temp:   98.2 F (36.8 C) 98.2 F (36.8 C)  TempSrc:      SpO2:   98% 97%  Weight:      Height:        Intake/Output Summary (Last 24 hours) at 05/12/2024 0804 Last data filed at 05/12/2024 0510 Gross per 24 hour  Intake 1107.58 ml  Output 875 ml  Net  232.58 ml   Filed Weights   05/11/24 2101  Weight: 72.1 kg    Examination:  General exam: Appears calm and comfortable  Respiratory system: Clear to auscultation. Respiratory effort normal. Cardiovascular system: S1 & S2 heard, RRR. No JVD, murmurs, rubs, gallops or clicks. No pedal edema. Gastrointestinal system: Abdomen is soft, tender to palpation Central nervous  system: Alert and oriented. No focal neurological deficits. Extremities: Symmetric 5 x 5 power.   Data Reviewed: I have personally reviewed following labs and imaging studies  CBC: Recent Labs  Lab 05/06/24 0832 05/11/24 1445 05/12/24 0442  WBC 4.4 6.3 6.8  NEUTROABS 2.4 3.7  --   HGB 8.6* 9.3* 8.7*  HCT 25.4* 27.8* 27.0*  MCV 95.8 98.2 98.2  PLT 126* 125* 136*   Basic Metabolic Panel: Recent Labs  Lab 05/06/24 0832 05/11/24 1445 05/12/24 0442  NA 142 141 141  K 3.3* 3.3* 3.3*  CL 108 106 106  CO2 27 23 21*  GLUCOSE 155* 95 100*  BUN 20 16 13   CREATININE 1.03* 1.36* 0.88  CALCIUM  8.5* 8.6* 8.4*  MG 1.9  --   --    GFR: Estimated Creatinine Clearance: 49.8 mL/min (by C-G formula based on SCr of 0.88 mg/dL). Liver Function Tests: Recent Labs  Lab 05/06/24 0832 05/11/24 1445 05/12/24 0442  AST 34 116* 105*  ALT 38 128* 123*  ALKPHOS 117 175* 174*  BILITOT 0.5 2.8* 3.9*  PROT 5.2* 5.6* 5.3*  ALBUMIN  3.0* 3.4* 3.2*   Recent Labs  Lab 05/11/24 1445  LIPASE 24   No results for input(s): AMMONIA in the last 168 hours. Coagulation Profile: No results for input(s): INR, PROTIME in the last 168 hours. Cardiac Enzymes: No results for input(s): CKTOTAL, CKMB, CKMBINDEX, TROPONINI in the last 168 hours. BNP (last 3 results) No results for input(s): PROBNP in the last 8760 hours. HbA1C: No results for input(s): HGBA1C in the last 72 hours. CBG: No results for input(s): GLUCAP in the last 168 hours. Lipid Profile: No results for input(s): CHOL, HDL, LDLCALC, TRIG, CHOLHDL, LDLDIRECT in the last 72 hours. Thyroid  Function Tests: No results for input(s): TSH, T4TOTAL, FREET4, T3FREE, THYROIDAB in the last 72 hours. Anemia Panel: No results for input(s): VITAMINB12, FOLATE, FERRITIN, TIBC, IRON, RETICCTPCT in the last 72 hours. Sepsis Labs: No results for input(s): PROCALCITON, LATICACIDVEN in the last 168  hours.  No results found for this or any previous visit (from the past 240 hours).       Radiology Studies: US  Abdomen Limited RUQ (LIVER/GB) Result Date: 05/11/2024 EXAM: Right Upper Quadrant Abdominal Ultrasound 05/11/2024 06:56:14 PM CLINICAL HISTORY: Abnormal transaminases. TECHNIQUE: Real-time ultrasonography of the right upper quadrant of the abdomen was performed. COMPARISON: US  Abdomen Limited 12/14/2023. CT abdomen pelvis 08/24/2023. FINDINGS: LIVER: Nodular hepatic contour. Coarsened echogenicity of the hepatic parenchyma. No intrahepatic biliary ductal dilatation. No focal liver lesions identified. BILIARY SYSTEM: Gallbladder wall not visualized. The common bile duct measures 6.0 mm. OTHER: No right upper quadrant ascites. IMPRESSION: 1. Cirrhosis. No focal liver lesions identified. Liver protocol contrast-enhanced MR or CT are most sensitive for HCC screening in cirrhosis. 2. Gallbladder wall not visualized. Electronically signed by: Morgane Naveau MD 05/11/2024 07:06 PM EDT RP Workstation: [id number]   CT ABDOMEN PELVIS W CONTRAST Addendum Date: 05/11/2024 ADDENDUM #1ADDENDUM: -- Clinicians worried about biliary obstruction. There is mild central intrahepatic biliary dilatation, slightly increased when compared to the prior CT scan from 12/14/2023. The gallbladder is surgically absent. The cystic and solid  appearing process in the gallbladder fossa is likely tumor and is grossly unchanged. The necrotic-appearing periportal nodal disease maybe slightly larger. It measures approximately 3.0x2.8 cm and previously measured 2.9x2.7 cm. There may be slight narrowing of the adjacent main portal vein and there also appears to be significant new stenosis of the left portal vein. MRI/MRCP may be helpful for further evaluation. -------------------------------------------------- Electronically signed by: Maude Stammer MD 05/11/2024 07:07 PM EDT RP Workstation: [id number]   Result Date:  05/11/2024 ORIGINAL REPORTEXAM: CT ABDOMEN AND PELVIS WITH CONTRAST 05/11/2024 03:50:05 PM TECHNIQUE: CT of the abdomen and pelvis was performed with the administration of 80 mL of iohexol  (OMNIPAQUE ) 300 MG/ML solution. Multiplanar reformatted images are provided for review. Automated exposure control, iterative reconstruction, and/or weight-based adjustment of the mA/kV was utilized to reduce the radiation dose to as low as reasonably achievable. COMPARISON: PET CT 03/21/2024 and CT abdomen/pelvis 12/14/2023. CLINICAL HISTORY: Bowel obstruction suspected; LLQ abdominal pain. FINDINGS: LOWER CHEST: Persistent/stable pulmonary metastatic disease. No pleural or pericardial effusion. Stable vascular calcifications. LIVER: Hepatic metastatic disease. Suspect new hepatic lesions in both hepatic lobes. 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 Persistent mild intrahepatic biliary dilatation. Stable necrotic-appearing periportal adenopathy. GALLBLADDER AND BILE DUCTS: Surgically absent. No biliary ductal dilatation. SPLEEN: No splenic lesions. PANCREAS: The pancreas is unremarkable and stable. ADRENAL GLANDS: Bilateral adrenal gland nodules are stable. KIDNEYS, URETERS AND BLADDER: No renal lesions. The bladder is normal. No stones in the kidneys or ureters. No hydronephrosis. No perinephric or periureteral stranding. GI AND BOWEL: The stomach, duodenum, small bowel, and colon are unremarkable. No findings suspicious for bowel obstruction. Moderate stool burden. PERITONEUM AND RETROPERITONEUM: No free abdominal or free pelvic fluid. No mesenteric or retroperitoneal mass. Overall stable scattered omental implants near the liver and near the transverse colon. VASCULATURE: Stable advanced atherosclerotic calcifications involving the aorta and branch vessels but no aneurysm or dissection. The major venous structures are patent. LYMPH NODES: No lymphadenopathy. REPRODUCTIVE  ORGANS: BONES AND SOFT TISSUES: The bony structures are intact. Extensive surgical changes involving the THORACIC and LUMBAR spines without obvious complicating features. Diffuse osteoporosis. No focal soft tissue abnormality. No inguinal mass or adenopathy. IMPRESSION: 1. No CT evidence of bowel obstruction. 2. Hepatic metastatic disease with suspected new hepatic lesions in both hepatic lobes. 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 3. Stable scattered omental implants near the liver and near the transverse colon. 4. Stable cystic and solid mass in the central pelvis measuring 4.8 cm. Electronically signed by: Maude Stammer MD 05/11/2024 04:38 PM EDT RP Workstation: [id number]        Scheduled Meds:  budesonide -glycopyrrolate -formoterol   2 puff Inhalation BID   Chlorhexidine  Gluconate Cloth  6 each Topical Daily   docusate sodium   100 mg Oral BID   enoxaparin  (LOVENOX ) injection  40 mg Subcutaneous Q24H   morphine   15 mg Oral Q12H   polyethylene glycol  17 g Oral BID   potassium chloride   40 mEq Oral Once   senna  1 tablet Oral BID   Continuous Infusions:  lactated ringers  75 mL/hr at 05/12/24 0510     LOS: 0 days    Time spent: 35 minutes    Corie Allis A Ivory Maduro, MD [facility]   If 7PM-7AM, please contact night-coverage [email or web address]  05/12/2024, 8:04 AM

## 2024-05-13 ENCOUNTER — Inpatient Hospital Stay: Admitting: Dietician

## 2024-05-13 ENCOUNTER — Inpatient Hospital Stay

## 2024-05-13 ENCOUNTER — Inpatient Hospital Stay (HOSPITAL_COMMUNITY)

## 2024-05-13 DIAGNOSIS — C23 Malignant neoplasm of gallbladder: Secondary | ICD-10-CM | POA: Diagnosis not present

## 2024-05-13 DIAGNOSIS — R935 Abnormal findings on diagnostic imaging of other abdominal regions, including retroperitoneum: Secondary | ICD-10-CM | POA: Diagnosis not present

## 2024-05-13 DIAGNOSIS — C801 Malignant (primary) neoplasm, unspecified: Secondary | ICD-10-CM

## 2024-05-13 DIAGNOSIS — R7989 Other specified abnormal findings of blood chemistry: Secondary | ICD-10-CM | POA: Diagnosis not present

## 2024-05-13 DIAGNOSIS — R1011 Right upper quadrant pain: Secondary | ICD-10-CM | POA: Diagnosis not present

## 2024-05-13 DIAGNOSIS — K831 Obstruction of bile duct: Secondary | ICD-10-CM | POA: Diagnosis not present

## 2024-05-13 LAB — CBC
HCT: 25.7 % — ABNORMAL LOW (ref 36.0–46.0)
Hemoglobin: 8.2 g/dL — ABNORMAL LOW (ref 12.0–15.0)
MCH: 31.8 pg (ref 26.0–34.0)
MCHC: 31.9 g/dL (ref 30.0–36.0)
MCV: 99.6 fL (ref 80.0–100.0)
Platelets: 105 K/uL — ABNORMAL LOW (ref 150–400)
RBC: 2.58 MIL/uL — ABNORMAL LOW (ref 3.87–5.11)
RDW: 21.7 % — ABNORMAL HIGH (ref 11.5–15.5)
WBC: 5.6 K/uL (ref 4.0–10.5)
nRBC: 0 % (ref 0.0–0.2)

## 2024-05-13 LAB — COMPREHENSIVE METABOLIC PANEL WITH GFR
ALT: 111 U/L — ABNORMAL HIGH (ref 0–44)
AST: 89 U/L — ABNORMAL HIGH (ref 15–41)
Albumin: 3.2 g/dL — ABNORMAL LOW (ref 3.5–5.0)
Alkaline Phosphatase: 185 U/L — ABNORMAL HIGH (ref 38–126)
Anion gap: 11 (ref 5–15)
BUN: 10 mg/dL (ref 8–23)
CO2: 22 mmol/L (ref 22–32)
Calcium: 8.4 mg/dL — ABNORMAL LOW (ref 8.9–10.3)
Chloride: 105 mmol/L (ref 98–111)
Creatinine, Ser: 0.91 mg/dL (ref 0.44–1.00)
GFR, Estimated: >60 mL/min (ref >60)
Glucose, Bld: 158 mg/dL — ABNORMAL HIGH (ref 70–99)
Potassium: 3.4 mmol/L — ABNORMAL LOW (ref 3.5–5.1)
Sodium: 139 mmol/L (ref 135–145)
Total Bilirubin: 5.7 mg/dL — ABNORMAL HIGH (ref 0.0–1.2)
Total Protein: 5.2 g/dL — ABNORMAL LOW (ref 6.5–8.1)

## 2024-05-13 MED ORDER — LACTULOSE 10 GM/15ML PO SOLN
20.0000 g | Freq: Two times a day (BID) | ORAL | Status: DC
  Administered 2024-05-14 – 2024-05-20 (×12): 20 g via ORAL
  Filled 2024-05-13 (×13): qty 30

## 2024-05-13 MED ORDER — BUDESON-GLYCOPYRROL-FORMOTEROL 160-9-4.8 MCG/ACT IN AERO
2.0000 | INHALATION_SPRAY | Freq: Two times a day (BID) | RESPIRATORY_TRACT | Status: DC
Start: 2024-05-13 — End: 2024-05-21
  Administered 2024-05-13 – 2024-05-21 (×17): 2 via RESPIRATORY_TRACT
  Filled 2024-05-13: qty 5.9

## 2024-05-13 MED ORDER — GADOBUTROL 1 MMOL/ML IV SOLN
7.0000 mL | Freq: Once | INTRAVENOUS | Status: AC | PRN
  Administered 2024-05-13: 7 mL via INTRAVENOUS

## 2024-05-13 NOTE — TOC Initial Note (Signed)
 Transition of Care [facility]) - Initial/Assessment Note    Patient Details  Name: Janice Pace MRN: [id number] Date of Birth: 1945/08/14  Transition of Care [facility]) CM/SW Contact:    Toy LITTIE Agar, RN Phone Number:[phone]  05/13/2024, 1:29 PM  Clinical Narrative:                 Inpatient care manager following patient with high risk for readmission. Cm at bedside for assessment. Patient states that she is from home with her spouse. DME rolling walker, wheelchair, and cane. NO home health needs. Patient does have PCP ETTER Sacks, Saddie FALCON, [place]) and states that she is active with her care. Patient does have insurance and access to affordable medications. Currently there are no care management needs.   Expected Discharge Plan: Home/Self Care Barriers to Discharge: Continued Medical Work up   Patient Goals and CMS Choice Patient states their goals for this hospitalization and ongoing recovery are:: Wants to be able to go home CMS Medicare.gov Compare Post Acute Care list provided to::  (n/a) Choice offered to / list presented to : NA [place] ownership interest in [facility].provided to::  (n/a)    Expected Discharge Plan and Services In-house Referral: NA Discharge Planning Services: CM Consult Post Acute Care Choice: NA Living arrangements for the past 2 months: Single Family Home                 DME Arranged: N/A DME Agency: NA       HH Arranged: NA HH Agency: NA        Prior Living Arrangements/Services Living arrangements for the past 2 months: Single Family Home Lives with:: Spouse Patient language and need for interpreter reviewed:: Yes Do you feel safe going back to the place where you live?: Yes      Need for Family Participation in Patient Care: No (Comment) Care giver support system in place?: Yes (comment) Current home services: DME (cane , walker, wheelchair) Criminal Activity/Legal Involvement Pertinent to Current Situation/Hospitalization: No -  Comment as needed  Activities of Daily Living   ADL Screening (condition at time of admission) Independently performs ADLs?: Yes (appropriate for developmental age) Is the patient deaf or have difficulty hearing?: No Does the patient have difficulty seeing, even when wearing glasses/contacts?: No Does the patient have difficulty concentrating, remembering, or making decisions?: No  Permission Sought/Granted Permission sought to share information with : Family Supports Permission granted to share information with : Yes, Verbal Permission Granted  Share Information with NAME: Keir Foland     Permission granted to share info w Relationship: spouse  Permission granted to share info w Contact Information: [phone]  Emotional Assessment Appearance:: Appears stated age Attitude/Demeanor/Rapport: Gracious Affect (typically observed): Pleasant, Quiet Orientation: : Oriented to Self, Oriented to Place, Oriented to  Time, Oriented to Situation Alcohol / Substance Use: Not Applicable Psych Involvement: No (comment)  Admission diagnosis:  Abnormal CT of the abdomen [R93.5] LFT elevation [R79.89] Abdominal pain [R10.9] Abdominal pain, unspecified abdominal location [R10.9] Patient Active Problem List   Diagnosis Date Noted   LFT elevation 05/12/2024   Abnormal CT of the abdomen 05/12/2024   Abdominal pain 05/11/2024   Odynophagia 04/28/2024   Leukopenia due to antineoplastic chemotherapy 04/07/2024   Hypokalemia 04/07/2024   Anemia due to antineoplastic chemotherapy 03/03/2024   Hypomagnesemia 03/03/2024   Port-A-Cath in place 02/11/2024   Carcinoma of gall bladder (HCC) 12/24/2023   Cancer associated pain 12/24/2023   Goals of care, counseling/discussion  12/24/2023   Constipation 12/22/2023   Obesity, Class I, BMI 30-34.9 12/16/2023   Carcinomatosis peritonei (HCC) 12/15/2023   DDD (degenerative disc disease), lumbar 09/17/2022   Essential hypertension 06/21/2022   COPD with  asthma (HCC) 05/06/2022   Leg cramps 11/25/2021   Menopausal vasomotor syndrome 11/25/2021   Bilateral adrenal adenomas 08/13/2021   Allergic rhinitis due to pollen 08/13/2021   Asthma without status asthmaticus 08/13/2021   Fatty liver 08/13/2021   Lumbosacral spondylosis without myelopathy 08/13/2021   Generalized anxiety disorder 08/13/2021   GERD (gastroesophageal reflux disease) 03/02/2021   Spinal stenosis of lumbar region 02/27/2021   Osteoporosis 12/11/2020   Lumbago with sciatica, unspecified side 10/16/2020   Hardening of the aorta (main artery of the heart) 07/23/2020   Prediabetes 10/18/2019   Mixed hyperlipidemia 10/03/2019   Vitamin D  deficiency 10/03/2019   Severe obesity (BMI 35.0-35.9 with comorbidity) (HCC) 10/03/2019   Unilateral primary osteoarthritis, left knee 08/18/2019   Hx of adenomatous colonic polyps 03/13/2003   PCP:  Gayle Saddie FALCON PA-C Pharmacy:   [id number] [address] ([address]), [place] - [address] [place] ([place]) [place] [address] Phone: [phone] Fax: [phone]  [place] [facility] [address] [place] [address] Phone: [phone] Fax: [phone]  [facility] - [place], [place] - [address] [place] [address] Phone: [phone] Fax: [phone]     Social Drivers of Health (SDOH) Social History: SDOH Screenings   Food Insecurity: No Food Insecurity (05/11/2024)  Housing: Low Risk  (05/11/2024)  Transportation Needs: No Transportation Needs (05/11/2024)  Utilities: Not At Risk (05/11/2024)  Alcohol Screen: Low Risk  (08/25/2023)  Depression (PHQ2-9): Low Risk  (05/09/2024)  Financial Resource Strain: Low Risk  (08/25/2023)  Physical Activity: Inactive (08/25/2023)  Social Connections: Socially Integrated (05/11/2024)  Stress: No Stress Concern Present (08/25/2023)  Tobacco Use: Medium Risk (05/11/2024)  Health Literacy: Adequate Health  Literacy (06/03/2023)   SDOH Interventions:     Readmission Risk Interventions    05/13/2024    1:19 PM  Readmission Risk Prevention Plan  Transportation Screening Complete  PCP or Specialist Appt within 3-5 Days Complete  HRI or Home Care Consult Complete  Social Work Consult for Recovery Care Planning/Counseling Complete  Palliative Care Screening Not Applicable  Medication Review Oceanographer) Complete

## 2024-05-13 NOTE — Consult Note (Addendum)
 Referring Provider: Dr. Owen Lore Primary Care Physician:  Gayle Saddie FALCON, PA-C Primary Gastroenterologist:  Dr. Lupita Commander   Reason for Consultation: Gallbladder cancer, hyperbilirubinemia, transaminitis  HPI: Janice Pace is a 78 y.o. female with a past medical history of Zaidi, depression, arthritis, asthma/COPD, hypertension, GERD, colon polyps and gallbladder cancer initially diagnosed 12/2023 s/p chemo/immunotherapy with Gemcitabine , Ciplatin and Durvalumab  x4 cycles. Followed by oncologist Dr. Cloretta.  She presented to [facility] ED 05/11/2024 with diffuse abdominal pain x 4 days. Labs in the ED showed a WBC count of 6.3.  Hemoglobin 9.3 up from 8.6 on 05/06/2024. Hematocrit 27.8.  Platelets 125.  Potassium 3.3.  BUN 16.  Creatinine 1.36 up from 1.03.  Calcium  8.6.  Total bili 2.8.  Alk phos 175.  AST 116.  ALT 128. (Total bili 0.5.  Alk phos 117.  AST 34.  ALT 38 on 10/24).  Lipase 24.  CTAP with contrast showed hepatic metastasis with suspected new hepatic lesions in both hepatic lobes, mild central intrahepatic biliary dilatation slightly increased when compared to prior imaging 12/2023, stable changes related to known gallbladder cancer, stable scattered omental implants near the liver and transverse colon and stable cystic/solid mass in the central pelvis measuring 4.8 cm.  RUQ sonogram showed evidence of cirrhosis with nodular hepatic contour the gallbladder was not visualized.  Labs 05/12/2024: WBC 6.8.  Hemoglobin 8.7.  Platelets 136.  Creatinine 0.88.  Total bili 3.9.  Alk phos 174.  AST 105.  ALT 123.  Labs today: WBC 5.6.  Hemoglobin 8.2.  Hematocrit 25.7.  Platelets 105.  0.91.  Total bili 5.7.  Alk phos 185.  AST 89.  ALT 111.  Abdominal MRI/MRCP with and without contrast today showed a heterogeneous contracted gallbladder with ill-defined peripherally irregular wall enhancing lesion in the fundus of the gallbladder which appears to infiltrate into the left  hepatic lobe suggestive of infiltrative tumor, multiple liver lesions which have increased in size and number since prior study compatible with worsening metastatic disease, mild to moderate central intrahepatic bile duct dilatation with abrupt narrowing/cutoff of the proximal extrahepatic bile duct, the mid/distal extrahepatic duct is nondilated, ill-defined mass in the caudate lobe of the liver which is likely the cause of abrupt cut off of the central biliary tree consistent with cholangiocarcinoma with metastasis.  There is moderate narrowing at the midportion of the main portal vein and nonvisualization of the left portal vein suggesting thrombosis and bilateral adrenal adenomas.  GI consult requested for further evaluation regarding hyperbilirubinemia with transaminitis concerning for obstructive process/mass effect in setting of known gallbladder cancer with hepatic metastasis.  She endorses having progressive generalized abdominal pain x 4 days prior to admission with associated constipation and urinary retention.  No nausea or vomiting.  She describes passing soft golden colored bowel movements every few days, last BM was 10/30.  No acholic stools.  No melena or bright red blood per the rectum.  She has chronic constipation for which she takes MiraLAX  daily.  Not on AC.  ASA 81 mg once daily is on her home medication list, however, she denies taking any NSAIDs loading aspirin .  History of GERD recent dysphagia on Omeprazole  20 mg p.o. daily.  Oral candidiasis may have been a contributing factor which was treated with Diflucan  and possible oral mucositis treated with Magic mouthwash for oncology.  She denies having any heartburn or dysphagia in the past few weeks.  She is on Morphine  p.o. twice daily for pain associated  with her gallbladder cancer.  Remote smoker.  No alcohol use.  ECHO 11/26/2022:  Left ventricular ejection fraction, by estimation, is 70 to 75%. The left ventricle has hyperdynamic  function. The left ventricle has no regional wall motion abnormalities. There is mild left ventricular hypertrophy. Left ventricular diastolic parameters were normal. 1. 2. Right ventricular systolic function is normal. The right ventricular size is normal. 3. The mitral valve is normal in structure. Trivial mitral valve regurgitation. The aortic valve was not well visualized. Aortic valve regurgitation is trivial. No aortic stenosis is present. 4. The inferior vena cava is normal in size with greater than 50% respiratory variability, suggesting right atrial pressure of 3 mmHg.  GI PROCEDURES:  Colonoscopy 11/26/2022 by Dr. Avram: - Perianal skin tags found on perianal exam. - Eight diminutive polyps in the sigmoid colon, in the descending colon, in the transverse colon and in the ascending colon, removed with a cold snare. Resected and retrieved. - Anal papilla(e) were hypertrophied. - The examination was otherwise normal on direct and retroflexion views. - TUBULAR ADENOMA (X[]  FRAGMENTS). - NO HIGH GRADE DYSPLASIA OR MALIGNANCY  Past Medical History:  Diagnosis Date   Arthritis    Asthma    Bronchitis    Cancer (HCC)    dermatosivros arcoma and protuverans   Carpal tunnel syndrome, left upper limb 11/06/2017   Chronic back pain    degenerative disc disease   Constipation    stool softener daily   Constipation    COPD (chronic obstructive pulmonary disease) (HCC)    GERD (gastroesophageal reflux disease)    takes Prilosec daily   GERD (gastroesophageal reflux disease)    Heart murmur    Heart valve disorder    Hemorrhoids    History of hiatal hernia    Ileus (HCC) 03/02/2021   Insomnia    d/t chantix    Joint pain    Nocturia    Ovarian failure 05/28/2022   Personal history of colonic adenoma 03/13/2003   03/13/2003 - 5 mm adenoma   Pre-diabetes    Prediabetes    White coat syndrome without diagnosis of hypertension     Past Surgical History:  Procedure Laterality Date    ABDOMINAL HYSTERECTOMY     ANTERIOR LAT LUMBAR FUSION N/A 02/27/2021   Procedure: Anterior lateral interbody fusion - lateral two - lateral three - lateral three - lateral four with exploration fusion L4-S1;  Surgeon: Onetha Kuba, MD;  Location: [facility] OR;  Service: Neurosurgery;  Laterality: N/A;   BACK SURGERY  1988   BACK SURGERY  09/03/2011   rods,screws x 8    BACK SURGERY     09/2022   BUNIONECTOMY     bilateral   BUNIONECTOMY     bil feet   COLONOSCOPY     dermatosibrosarcoma protuberans     IR IMAGING GUIDED PORT INSERTION  01/07/2024   LAMINECTOMY WITH POSTERIOR LATERAL ARTHRODESIS LEVEL 3 N/A 02/27/2021   Procedure: Posterior augmentation with pedicle screws Latreal one - lateral four - Posterior Lateral and Interbody fusion;  Surgeon: Onetha Kuba, MD;  Location: [facility] OR;  Service: Neurosurgery;  Laterality: N/A;   PARTIAL HYSTERECTOMY     RECTOCELE REPAIR     Rectum Repair     RESECTION TUMOR WRIST RADICAL     x 2 rt   TUBAL LIGATION  1974   TUBAL LIGATION     WRIST SURGERY     left    Prior to Admission medications   Medication Sig  Start Date End Date Taking? Authorizing Provider  albuterol  (VENTOLIN  HFA) 108 (90 Base) MCG/ACT inhaler Inhale 1-2 puffs into the lungs every 6 (six) hours as needed for wheezing or shortness of breath. 04/11/24   Kara Dorn NOVAK, MD  aspirin  81 MG EC tablet Take 81 mg by mouth daily.    [provider]  budesonide -glycopyrrolate -formoterol  (BREZTRI  AEROSPHERE) 160-9-4.8 MCG/ACT AERO inhaler Inhale 2 puffs into the lungs in the morning and at bedtime. 04/11/24   Kara Dorn NOVAK, MD  dexamethasone  (DECADRON ) 4 MG tablet Take 2 tablets (8 mg) by mouth daily x 3 days starting the day after cisplatin  chemotherapy. Take with food. 12/24/23   Pasam, Chinita, MD  docusate sodium  (COLACE) 100 MG capsule Take 500 mg by mouth at bedtime.    [provider]  fluconazole  (DIFLUCAN ) 200 MG tablet Take 1 tablet (200 mg total) by mouth daily.  04/26/24   Pasam, Chinita, MD  lidocaine -prilocaine  (EMLA ) cream Apply to affected area once 12/24/23   Pasam, Avinash, MD  losartan (COZAAR) 50 MG tablet Take 1 tablet (50 mg total) by mouth daily. 05/09/24   Gayle Saddie FALCON, PA-C  magic mouthwash (nystatin, lidocaine , diphenhydrAMINE , alum & mag hydroxide) suspension Take 5 mLs by mouth every 4 (four) hours as needed for mouth pain. Suspension contains equal amounts of Maalox, nystatin, diphenhydramine  and lidocaine . 1:1:1:1 ratio. 04/28/24   Pasam, Chinita, MD  magnesium  oxide (MAG-OX) 400 (240 Mg) MG tablet Take 1 tablet (400 mg total) by mouth 2 (two) times daily. 05/04/24   Pasam, Chinita, MD  megestrol  (MEGACE  ES) 625 MG/5ML suspension Take 5 mLs (625 mg total) by mouth daily. 01/27/24   Pasam, Chinita, MD  methylPREDNISolone  (MEDROL  DOSEPAK) 4 MG TBPK tablet Take as directed on package instructions 04/22/24   Pasam, Avinash, MD  metoprolol  succinate (TOPROL -XL) 50 MG 24 hr tablet TAKE 1 TABLET BY MOUTH ONCE DAILY. TAKE WITH OR IMMEDIATELY FOLLOWING A MEAL 11/11/23   Chandra Toribio POUR, MD  morphine  (MS CONTIN ) 15 MG 12 hr tablet Take 1 tablet (15 mg total) by mouth every 12 (twelve) hours. 05/06/24   Pasam, Chinita, MD  nystatin (MYCOSTATIN) 100000 UNIT/ML suspension Take 5 mLs by mouth 4 (four) times daily. 04/21/24   [provider]  omeprazole  (PRILOSEC) 20 MG capsule Take 1 capsule (20 mg total) by mouth daily. 05/02/24   Gayle Saddie FALCON, PA-C  ondansetron  (ZOFRAN ) 4 MG tablet Take 1 tablet (4 mg total) by mouth every 6 (six) hours as needed for nausea. 12/16/23   Laurence Locus, DO  ondansetron  (ZOFRAN ) 8 MG tablet Take 1 tablet (8 mg total) by mouth every 8 (eight) hours as needed for nausea or vomiting. Start on the third day after cisplatin . 12/24/23   Pasam, Chinita, MD  oxyCODONE  10 MG TABS Take 1-1.5 tablets (10-15 mg total) by mouth every 4 (four) hours as needed for moderate pain (pain score 4-6) or severe pain (pain score 7-10). 12/16/23    Laurence Locus, DO  polyethylene glycol (MIRALAX  / GLYCOLAX ) 17 g packet Take 17 g by mouth 2 (two) times daily.    [provider]  prochlorperazine  (COMPAZINE ) 10 MG tablet Take 1 tablet (10 mg total) by mouth every 6 (six) hours as needed (Nausea or vomiting). 12/24/23   Pasam, Chinita, MD  rosuvastatin  (CRESTOR ) 10 MG tablet Take 1 tablet by mouth once daily 02/18/24   Clapp, Kara F, PA-C  spironolactone  (ALDACTONE ) 25 MG tablet TAKE 1 TABLET BY MOUTH ONCE DAILY IN THE  MORNING 05/06/24   Ladona Heinz, MD    Current Facility-Administered Medications  Medication Dose Route Frequency Provider Last Rate Last Admin   albuterol  (PROVENTIL ) (2.5 MG/3ML) 0.083% nebulizer solution 2.5 mg  2.5 mg Nebulization Q2H PRN Regalado, Belkys A, MD       bisacodyl  (DULCOLAX) suppository 10 mg  10 mg Rectal Daily PRN Regalado, Belkys A, MD       budesonide -glycopyrrolate -formoterol  (BREZTRI ) 160-9-4.8 MCG/ACT inhaler 2 puff  2 puff Inhalation BID Regalado, Belkys A, MD   2 puff at 05/13/24 0816   Chlorhexidine  Gluconate Cloth 2 % PADS 6 each  6 each Topical Daily Regalado, Belkys A, MD   6 each at 05/13/24 0956   docusate sodium  (COLACE) capsule 100 mg  100 mg Oral BID Regalado, Belkys A, MD   100 mg at 05/13/24 0850   enoxaparin  (LOVENOX ) injection 40 mg  40 mg Subcutaneous Q24H Regalado, Belkys A, MD   40 mg at 05/12/24 2143   feeding supplement (ENSURE PLUS HIGH PROTEIN) liquid 237 mL  237 mL Oral BID BM Regalado, Belkys A, MD   237 mL at 05/13/24 1429   hydrALAZINE  (APRESOLINE ) injection 10 mg  10 mg Intravenous Q6H PRN Regalado, Belkys A, MD   10 mg at 05/11/24 2325   LORazepam (ATIVAN) injection 0.5 mg  0.5 mg Intravenous Q6H PRN Regalado, Belkys A, MD       melatonin tablet 3 mg  3 mg Oral QHS PRN Regalado, Belkys A, MD   3 mg at 05/12/24 2201   methocarbamol  (ROBAXIN ) injection 500 mg  500 mg Intravenous Q6H PRN Regalado, Belkys A, MD       morphine  (MS CONTIN ) 12 hr tablet 15 mg  15 mg Oral Q12H  Regalado, Belkys A, MD   15 mg at 05/13/24 0956   morphine  (PF) 2 MG/ML injection 2 mg  2 mg Intravenous Q2H PRN Regalado, Belkys A, MD   2 mg at 05/13/24 9162   ondansetron  (ZOFRAN ) tablet 4 mg  4 mg Oral Q6H PRN Regalado, Belkys A, MD       Or   ondansetron  (ZOFRAN ) injection 4 mg  4 mg Intravenous Q6H PRN Regalado, Belkys A, MD       oxyCODONE  (Oxy IR/ROXICODONE ) immediate release tablet 10-15 mg  10-15 mg Oral Q4H PRN Regalado, Belkys A, MD   15 mg at 05/12/24 1444   phenylephrine -shark liver oil-mineral oil-petrolatum (PREPARATION H) rectal ointment 1 Application  1 Application Rectal BID PRN Andrez Chroman, NP   1 Application at 05/12/24 0034   polyethylene glycol (MIRALAX  / GLYCOLAX ) packet 17 g  17 g Oral BID Regalado, Belkys A, MD   17 g at 05/13/24 0850   senna (SENOKOT) tablet 8.6 mg  1 tablet Oral BID Regalado, Belkys A, MD   8.6 mg at 05/13/24 0850   sodium phosphate  (FLEET) enema 1 enema  1 enema Rectal Once Regalado, Belkys A, MD        Allergies as of 05/11/2024 - Review Complete 05/11/2024  Allergen Reaction Noted   Sulfa antibiotics Hives and Itching 10/03/2020   Forteo  [parathyroid hormone (recomb)] Other (See Comments) 12/14/2023   Atorvastatin Other (See Comments) 10/03/2020   Metformin hcl Other (See Comments) 10/03/2020    Family History  Problem Relation Age of Onset   Leukemia Mother    Anesthesia problems Neg Hx    Hypotension Neg Hx    Malignant hyperthermia Neg Hx    Pseudochol deficiency Neg Hx  Colon cancer Neg Hx    Colon polyps Neg Hx    Esophageal cancer Neg Hx    Stomach cancer Neg Hx    Rectal cancer Neg Hx     Social History   Socioeconomic History   Marital status: Married    Spouse name: david   Number of children: 3   Years of education: Not on file   Highest education level: GED or equivalent  Occupational History   Occupation: retired  Tobacco Use   Smoking status: Former    Current packs/day: 0.00    Average packs/day: 1  pack/day for 50.0 years (50.0 ttl pk-yrs)    Types: Cigarettes    Start date: 08/16/1961    Quit date: 08/17/2011    Years since quitting: 12.7    Passive exposure: Never   Smokeless tobacco: Never  Vaping Use   Vaping status: Never Used  Substance and Sexual Activity   Alcohol use: Not Currently    Comment: occasional wine   Drug use: No   Sexual activity: Yes    Birth control/protection: Surgical, Other-see comments  Other Topics Concern   Not on file  Social History Narrative   ** Merged History Encounter **       Social Drivers of Health   Financial Resource Strain: Low Risk  (08/25/2023)   Overall Financial Resource Strain (CARDIA)    Difficulty of Paying Living Expenses: Not hard at all  Food Insecurity: No Food Insecurity (05/11/2024)   Hunger Vital Sign    Worried About Running Out of Food in the Last Year: Never true    Ran Out of Food in the Last Year: Never true  Transportation Needs: No Transportation Needs (05/11/2024)   PRAPARE - Administrator, Civil Service (Medical): No    Lack of Transportation (Non-Medical): No  Physical Activity: Inactive (08/25/2023)   Exercise Vital Sign    Days of Exercise per Week: 0 days    Minutes of Exercise per Session: 0 min  Stress: No Stress Concern Present (08/25/2023)   [employer] of Occupational Health - Occupational Stress Questionnaire    Feeling of Stress : Not at all  Social Connections: Socially Integrated (05/11/2024)   Social Connection and Isolation Panel    Frequency of Communication with Friends and Family: More than three times a week    Frequency of Social Gatherings with Friends and Family: Once a week    Attends Religious Services: More than 4 times per year    Active Member of [employer] or Organizations: Yes    Attends Engineer, Structural: More than 4 times per year    Marital Status: Married  Catering Manager Violence: Not At Risk (05/11/2024)   Humiliation, Afraid, Rape, and Kick  questionnaire    Fear of Current or Ex-Partner: No    Emotionally Abused: No    Physically Abused: No    Sexually Abused: No    Review of Systems: Gen: Denies fever, sweats or chills. No weight loss.  CV: Denies chest pain, palpitations or edema. Resp: Denies cough, shortness of breath of hemoptysis.  GI:See HPI.   GU: See HPI. MS: Denies joint pain, muscles aches or weakness. Derm:+ Pruritus red rash all over body.  Psych: Denies depression, anxiety, memory loss or confusion. Heme: Denies easy bruising, bleeding. Neuro:  Denies headaches, dizziness or paresthesias. Endo: Prediabetes.   Physical Exam: Vital signs in last 24 hours: Temp:  [97.8 F (36.6 C)-98.7 F (37.1 C)] 98.7 F (  37.1 C) (10/31 1331) Pulse Rate:  [78-103] 103 (10/31 1331) Resp:  [18-20] 20 (10/31 1331) BP: (148-181)/(68-80) 181/80 (10/31 1331) SpO2:  [98 %-99 %] 99 % (10/31 1331) Last BM Date : 05/12/24 General:  Alert fatigued appearing 78 year old female in no acute distress. Head:  Normocephalic and atraumatic. Eyes: Scleral icterus present.  Conjunctiva pink. Ears:  Normal auditory acuity. Nose:  No deformity, discharge or lesions. Mouth:  Dentition intact. No ulcers or lesions. Yellowish coating on tongue. Neck:  Supple. No lymphadenopathy or thyromegaly.  Lungs: Breath sounds clear throughout. No wheezes, rhonchi or crackles.  On room air. Heart: Regular rate and rhythm, no murmurs. Abdomen: Distended firm abdomen. Right upper abdomen significantly protrudes.  Tenderness throughout the right upper and lower abdomen without rebound or guarding.  Positive bowel sounds to all 4 quadrants.  No bruit. Rectal: Deferred. Musculoskeletal:  Symmetrical without gross deformities.  Pulses:  Normal pulses noted. Extremities:  Without clubbing or edema. Neurologic:  Alert and  oriented x 4. No focal deficits.  Skin: Jaundice present. Diffuse erythematous macular rash to chest, back, arms and legs. Psych:   Alert and cooperative. Normal mood and affect.  Intake/Output from previous day: 10/30 0701 - 10/31 0700 In: 754.5 [I.V.:754.5] Out: 1450 [Urine:1450] Intake/Output this shift: Total I/O In: -  Out: 450 [Urine:450]  Lab Results: Recent Labs    05/11/24 1445 05/12/24 0442 05/13/24 1309  WBC 6.3 6.8 5.6  HGB 9.3* 8.7* 8.2*  HCT 27.8* 27.0* 25.7*  PLT 125* 136* 105*   BMET Recent Labs    05/11/24 1445 05/12/24 0442 05/13/24 1309  NA 141 141 139  K 3.3* 3.3* 3.4*  CL 106 106 105  CO2 23 21* 22  GLUCOSE 95 100* 158*  BUN 16 13 10   CREATININE 1.36* 0.88 0.91  CALCIUM  8.6* 8.4* 8.4*   LFT Recent Labs    05/13/24 1309  PROT 5.2*  ALBUMIN  3.2*  AST 89*  ALT 111*  ALKPHOS 185*  BILITOT 5.7*   PT/INR No results for input(s): LABPROT, INR in the last 72 hours. Hepatitis Panel No results for input(s): HEPBSAG, HCVAB, HEPAIGM, HEPBIGM in the last 72 hours.  RUQ sono 05/11/2024: FINDINGS:   LIVER: Nodular hepatic contour. Coarsened echogenicity of the hepatic parenchyma. No intrahepatic biliary ductal dilatation. No focal liver lesions identified.   BILIARY SYSTEM: Gallbladder wall not visualized. The common bile duct measures 6.0 mm.   OTHER: No right upper quadrant ascites.   IMPRESSION: 1. Cirrhosis. No focal liver lesions identified. Liver protocol contrast-enhanced MR or CT are most sensitive for HCC screening in cirrhosis. 2. Gallbladder wall not visualized.    CTAP with contrast 05/11/2024: EXAM: CT ABDOMEN AND PELVIS WITH CONTRAST 05/11/2024 03:50:05 PM   TECHNIQUE: CT of the abdomen and pelvis was performed with the administration of 80 mL of iohexol  (OMNIPAQUE ) 300 MG/ML solution. Multiplanar reformatted images are provided for review. Automated exposure control, iterative reconstruction, and/or weight-based adjustment of the mA/kV was utilized to reduce the radiation dose to as low as reasonably achievable.   COMPARISON: PET  CT 03/21/2024 and CT abdomen/pelvis 12/14/2023.   CLINICAL HISTORY: Bowel obstruction suspected; LLQ abdominal pain.   FINDINGS:   LOWER CHEST: Persistent/stable pulmonary metastatic disease. No pleural or pericardial effusion. Stable vascular calcifications.   LIVER: Hepatic metastatic disease. Suspect new hepatic lesions in both hepatic lobes. 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 Persistent mild intrahepatic biliary dilatation. Stable necrotic-appearing periportal adenopathy.  GALLBLADDER AND BILE DUCTS: Surgically absent. No biliary ductal dilatation.   SPLEEN: No splenic lesions.   PANCREAS: The pancreas is unremarkable and stable.   ADRENAL GLANDS: Bilateral adrenal gland nodules are stable.   KIDNEYS, URETERS AND BLADDER: No renal lesions. The bladder is normal. No stones in the kidneys or ureters. No hydronephrosis. No perinephric or periureteral stranding.   GI AND BOWEL: The stomach, duodenum, small bowel, and colon are unremarkable. No findings suspicious for bowel obstruction. Moderate stool burden.   PERITONEUM AND RETROPERITONEUM: No free abdominal or free pelvic fluid. No mesenteric or retroperitoneal mass. Overall stable scattered omental implants near the liver and near the transverse colon.   VASCULATURE: Stable advanced atherosclerotic calcifications involving the aorta and branch vessels but no aneurysm or dissection. The major venous structures are patent.   LYMPH NODES: No lymphadenopathy.   REPRODUCTIVE ORGANS:   BONES AND SOFT TISSUES: The bony structures are intact. Extensive surgical changes involving the THORACIC and LUMBAR spines without obvious complicating features. Diffuse osteoporosis. No focal soft tissue abnormality. No inguinal mass or adenopathy.   IMPRESSION: 1. No CT evidence of bowel obstruction. 2. Hepatic metastatic disease with suspected new hepatic lesions in  both hepatic lobes. 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 3. Stable scattered omental implants near the liver and near the transverse colon. 4. Stable cystic and solid mass in the central pelvis measuring 4.8 cm.   ADDENDUM: -- Clinicians worried about biliary obstruction. There is mild central intrahepatic biliary dilatation, slightly increased when compared to the prior CT scan from 12/14/2023. The gallbladder is surgically absent. The cystic and solid appearing process in the gallbladder fossa is likely tumor and is grossly unchanged. The necrotic-appearing periportal nodal disease maybe slightly larger. It measures approximately 3.0x2.8 cm and previously measured 2.9x2.7 cm. There may be slight narrowing of the adjacent main portal vein and there also appears to be significant new stenosis of the left portal vein. MRI/MRCP may be helpful for further evaluation.    Abdominal MRI/MRCP with and without contrast 05/13/2024:  CLINICAL DATA:  Epigastric pain 114842 Epigastric pain 114842; Epigastric pain.   EXAM: MRI ABDOMEN WITHOUT AND WITH CONTRAST (INCLUDING MRCP)   TECHNIQUE: Multiplanar multisequence MR imaging of the abdomen was performed both before and after the administration of intravenous contrast. Heavily T2-weighted images of the biliary and pancreatic ducts were obtained, and three-dimensional MRCP images were rendered by post processing.   CONTRAST:  7mL GADAVIST  GADOBUTROL  1 MMOL/ML IV SOLN   COMPARISON:  Ultrasound abdomen and CT scan abdomen and pelvis from 05/11/2024.   FINDINGS: Lower chest: Unremarkable MR appearance to the lung bases. No pleural effusion. No pericardial effusion. Normal heart size.   Hepatobiliary: The liver is moderately enlarged in size measuring up to 17.7 cm in length. Noncirrhotic configuration.   There are multiple (around 10), T2 hyperintense,, peripherally rim enhancing target  like lesions throughout liver, which have increased in size and number since the prior study and compatible with worsening metastatic disease.   There is redemonstration of heterogeneous contracted gallbladder with ill-defined heterogeneous peripherally irregular wall enhancing lesion in the fundus of the gallbladder which appears to infiltrate into the left hepatic lobe, segment 4B, suggesting infiltrating tumor. The lesion measures up to 2.9 x 3.1 cm, grossly similar to the prior study.,   There is mild-to-moderate central intrahepatic bile duct dilation. There is abrupt narrowing/cutoff of the proximal extrahepatic bile duct. The mid/distal extrahepatic bile duct is nondilated.  There is ill-defined peripheral irregular wall enhancing mass in the caudate lobe which is likely the cause of abrupt cutoff of the central biliary tree. Findings may be due to primary hilar cholangiocarcinoma versus metastases.   The middle and right intrahepatic veins are patent. Left hepatic vein is not well seen, likely thrombosed. There is moderate narrowing in the midportion of main portal vein (series 19, image 31). Right portal vein and its major tributaries are patent. However, there is nonvisualization of left portal vein, suggesting thrombosis. These findings are new since the prior study from 12/14/2023.   Pancreas: No mass, inflammatory changes or other parenchymal abnormality identified. No main pancreatic duct dilation.   Spleen: Top-normal spleen measuring upto 5.5 x 12.9 orthogonally on coronal plane. No focal mass.   Adrenals/Urinary Tract: Bilateral adrenal adenomas noted measuring 1.1 x 1.5 cm on the left side and 1.0 x 1.5 cm on the right side. No hydroureteronephrosis. No suspicious renal mass.   Stomach/Bowel: Visualized portions within the abdomen are unremarkable. No disproportionate dilation of bowel loops.   Vascular/Lymphatic: No pathologically enlarged lymph  nodes identified. No abdominal aortic aneurysm demonstrated. No ascites. Redemonstration of multiple nodular implants throughout the abdomen, compatible with peritoneal carcinomatosis. Overall, there is mild interval increase in the size, suggesting worsening peritoneal carcinomatosis.   Other: Susceptibility artifact from thoracolumbar spine spinal fixation hardware noted.   Musculoskeletal: No suspicious bone lesions identified.   IMPRESSION: 1. There is redemonstration of heterogeneous contracted gallbladder with ill-defined heterogeneous peripherally irregular wall enhancing lesion in the fundus of the gallbladder which appears to infiltrate into the left hepatic lobe, segment 4B, suggesting infiltrating tumor. 2. There are multiple (around 10), T2 hyperintense, peripherally rim enhancing target like lesions throughout liver, which have increased in size and number since the prior study and compatible with worsening metastatic disease. 3. There is mild-to-moderate central intrahepatic bile duct dilation. There is abrupt narrowing/cutoff of the proximal extrahepatic bile duct. The mid/distal extrahepatic bile duct is nondilated. There is ill-defined peripheral irregular wall enhancing mass in the caudate lobe which is likely the cause of abrupt cutoff of the central biliary tree. Findings may be due to primary hilar cholangiocarcinoma versus metastases. 4. There is moderate narrowing in the midportion of main portal vein. Right portal vein and its major tributaries are patent. However, there is nonvisualization of left portal vein, suggesting thrombosis. There is also new nonvisualization of left hepatic vein. These findings are new since the prior study from 12/14/2023. 5. Bilateral adrenal adenomas.    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 CTAP with contrast showed hepatic metastasis with suspected new hepatic lesions in both hepatic lobes, mild central intrahepatic biliary dilatation slightly increased when compared to prior imaging 12/2023, stable changes related to known gallbladder cancer, stable scattered omental implants near the liver and transverse colon and stable cystic/solid mass in the central pelvis measuring 4.8 cm. RUQ sonogram showed evidence of cirrhosis with nodular hepatic contour (findings likely due to hepatic metastasis and less likely cirrhosis) and the gallbladder was not visualized. Abdominal MRI/MRCP with and without contrast today showed a heterogeneous contracted gallbladder with ill-defined peripherally irregular wall enhancing lesion in the fundus of the gallbladder which appears to infiltrate into the left hepatic lobe suggestive of infiltrative tumor, multiple liver lesions which have increased in size and number since prior study compatible  with worsening metastatic disease, mild to moderate central intrahepatic bile duct dilatation with abrupt narrowing/cutoff of the proximal extrahepatic bile duct, the mid/distal extrahepatic duct is nondilated, ill-defined mass in the caudate lobe of the liver which is likely the cause of abrupt cut off of the central biliary tree consistent with cholangiocarcinoma with metastasis. There is moderate narrowing at the midportion of the main portal vein and nonvisualization of the left portal vein suggesting thrombosis. Dr. Charlanne reviewed MRI/MRCP, biliary obstruction is too high up in the liver and not amenable for ERCP/biliary stent placement.  - IR consult for perc biliary drain  - CBC, BMP, Hepatic panel and PT/INR in am - Pain management per the hospitalist  - Regular diet as tolerated  - Await further recommendations per Dr. Charlanne   Normocytic anemia, secondary to  gallbladder cancer with metastatic disease. No overt GI bleeding.  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 twice daily - Dulcolax suppository nightly as needed  GERD - Pantoprazole  40 mg p.o. daily  Diffuse cutaneous rash, suspect immune mediated dermatitis due to chemo/immunotherapy  Hypokalemia - KCL replacement per the hospitalist   COPD/asthma   Elida CHRISTELLA Shawl  05/13/2024, 4:30 PM    Attending physician's note   I have taken history, reviewed the chart and examined the patient. I performed a substantive portion of this encounter, including complete performance of at least one of the key components, in conjunction with the APP. I agree with the Advanced Practitioner's note, impression and recommendations.   Stage IV GB AdenoCa with mets to liver, lungs and peritoneal carcinomatosis.  New onset jaundice with MRI showing abrupt cut off of proximal extrahepatic ducts-on review, has intrahepatic location.  Most likely due to liver mets  Plan: - Unfortunately, due to intrahepatic location and anatomy, this is not amenable to ERCP drainage/stent placement. - Recommend IR for PTBD/percutaneous stent - Discussed extensively with the patient and patient's family.  Have also shown them MRI films.  Continue pain management, supportive treatment ?  Palliative care consult this hospitalization.  Please call with any ?   Anselm Charlanne, MD [facility] [phone]

## 2024-05-13 NOTE — Progress Notes (Addendum)
 Janice Pace   DOB:[birth year]   FM#:[id number]      ASSESSMENT & PLAN:  Janice Pomeroy. Pace is a 78 year old female patient with oncologic history significant for gallbladder cancer.  She came to ED on 10/29 with complaints of abdominal pain.  Medical Oncology/Dr. Autumn following.   Abdominal pain -- Secondary to known malignancy -- Patient complains of progressively worsening abdominal pain. -- CT 10/29 did not show evidence of bowel obstruction.  -- Continue pain meds per orders.    Adenocarcinoma of Gallbladder with peritoneal and lung mets Peritoneal carcinomatosis -- Diagnosed 12/15/23. Path of omental biopsy showed metastatic well to moderately differentiated adenocarcinoma -- Status post chemo/immunotherapy with gemcitabine , ciplatin, and durvalumab  x4 cycles.   -- Recent Pet scan done 9/8 showed improvement. -- Plan was to continue chemo/immunotherapy for up to 8 cycles; 6 cycles if cytopenias or other side effects.  -- CT 10/29 shows new hepatic lesions in both hepatic lobes. Also shows stable scattered omental implants near liver and near transverse colon.  -- Pending MRCP for further evaluation, will follow results. Depending on findings, may need GI or IR involvement for further management.  -- Medical Oncology/Dr. Pasam following closely.  Dr. [facility] covering at this time.    Cutaneous rash -- Patient with generalized rash over face, bilateral upper and lower extremities, chest and back secondary to immunotherapy.  Does not itch per patient and is improving. -- Continue to monitor closely.   Dysuria -- Foley cath placed this admission.  Continues to drain well.  -- Monitor closely   Anemia, normocytic Thrombocytopenia  -- HGB low 8.7 on 10/30. Pending today's results.  -- Platelets slightly low 136K on 10/30 -- No transfusional intervention required at this time -- Continue to monitor CBC with differential   Hyperbilirubinemia Transaminitis cirrhosis -- May be due to  malignancy and cirrhosis -- US  liver protocol done 10/29 shows cirrhosis -- Bilirubin has been slowly trending upwards, increased to 3.9 -- LFTs also trending upwards -- Avoid hepatotoxic agents    Code Status DNR-Limited  Subjective:  Patient seen awake and alert laying in bed.  Spouse at bedside.  She reports ongoing abdominal pain that has not been relieved despite pain medications.  Currently awaiting MRCP procedure and hopeful that pain will decrease soon.  Spouse is at bedside.  No other complaints offered.  Objective:   Intake/Output Summary (Last 24 hours) at 05/13/2024 1014 Last data filed at 05/13/2024 0500 Gross per 24 hour  Intake 754.49 ml  Output 1450 ml  Net -695.51 ml     PHYSICAL EXAMINATION: ECOG PERFORMANCE STATUS: 3 - Symptomatic, >50% confined to bed  Vitals:   05/13/24 0618 05/13/24 0816  BP: (!) 148/70   Pulse: 78   Resp: 20   Temp: 98 F (36.7 C)   SpO2: 98% 98%   Filed Weights   05/11/24 2101  Weight: 159 lb (72.1 kg)    GENERAL: alert, no distress and comfortable SKIN: +Generalized rash over face, chest, back, bilateral LE and UE, jaundice EYES: normal, conjunctiva are pink and non-injected, scleral icterus OROPHARYNX: no exudate, no erythema and lips, buccal mucosa, and tongue normal  NECK: supple, thyroid  normal size, non-tender, without nodularity LYMPH: no palpable lymphadenopathy in the cervical, axillary or inguinal LUNGS: clear to auscultation and percussion with normal breathing effort HEART: regular rate & rhythm and no murmurs and no lower extremity edema ABDOMEN: + Abdominal tenderness MUSCULOSKELETAL: no cyanosis of digits and no clubbing  PSYCH: alert &  oriented x 3 with fluent speech NEURO: no focal motor/sensory deficits   All questions were answered. The patient knows to call the clinic with any problems, questions or concerns.   The total time spent in the appointment was 40 minutes encounter with patient including  review of chart and various tests results, discussions about plan of care and coordination of care plan  Janice JINNY Brunner, NP 05/13/2024 10:14 AM    Labs Reviewed:  Lab Results  Component Value Date   WBC 6.8 05/12/2024   HGB 8.7 (L) 05/12/2024   HCT 27.0 (L) 05/12/2024   MCV 98.2 05/12/2024   PLT 136 (L) 05/12/2024   Recent Labs    05/06/24 0832 05/11/24 1445 05/12/24 0442  NA 142 141 141  K 3.3* 3.3* 3.3*  CL 108 106 106  CO2 27 23 21*  GLUCOSE 155* 95 100*  BUN 20 16 13   CREATININE 1.03* 1.36* 0.88  CALCIUM  8.5* 8.6* 8.4*  GFRNONAA 56* 40* >60  PROT 5.2* 5.6* 5.3*  ALBUMIN  3.0* 3.4* 3.2*  AST 34 116* 105*  ALT 38 128* 123*  ALKPHOS 117 175* 174*  BILITOT 0.5 2.8* 3.9*    Studies Reviewed:  PET SCAN 03/21/2024 IMPRESSION: 1. Minimal response to therapy, as evidenced by decreased size and hypermetabolism of Pulmonary Metastasis, gallbladder primary and abdominal nodal metastasis. Omental and left pelvic peritoneal/adnexal implants are relatively similar. 2.  No new sites of disease. 3. Anorectal hypermetabolism is most likely physiologic 4. Incidental findings, including: Aortic atherosclerosis ([id number]-i70.0). Coronary artery atherosclerosis.   CT SCAN 05/11/2024  IMPRESSION: 1. No CT evidence of bowel obstruction. 2. Hepatic metastatic disease with suspected new hepatic lesions in both hepatic lobes. 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 3. Stable scattered omental implants near the liver and near the transverse colon. 4. Stable cystic and solid mass in the central pelvis measuring 4.8 cm.   US  Liver 05/11/24  IMPRESSION: 1. Cirrhosis. No focal liver lesions identified. Liver protocol contrast-enhanced MR or CT are most sensitive for HCC screening in cirrhosis. 2. Gallbladder wall not visualized.  [name] was interviewed and examined.  Her husband was at the bedside when I saw her at approximately 7  AM.  She denied pain this morning.  The skin rash is improving.  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  She was last treated with gemcitabine /cisplatin /durvalumab  on 04/22/2024.  The CT abdomen/pelvis on hospital admission showed evidence of new hepatic metastases.  The bilirubin is elevated.  She is scheduled for an MRI/MRCP today to look for evidence of biliary obstruction.  Recommendations: MRI/MRCP today-consult GI if there is evidence of biliary obstruction Continue narcotic analgesics for pain Out of bed, ambulate as tolerated Decision on further chemotherapy per Dr. Autumn Please call oncology as needed over the weekend

## 2024-05-13 NOTE — Progress Notes (Signed)
 PROGRESS NOTE    Janice Pace  FMW:[id number] DOB: [birth year] DOA: 05/11/2024 PCP: Gayle Saddie Janice Pace   Brief Narrative: 78 year old with past medical history significant for asthma, COPD, GERD, prediabetes, stage IV adenocarcinoma of gallbladder with peritoneal metastasis and lung metastasis, undergoing chemoimmunotherapy, presented with worsening abdominal pain that started on Sunday, cramping pain feels like she cannot have a bowel movement or able to urinate.  CT abdomen and pelvis was negative for obstruction but showed 2 new lesion in the hepatic lobe.  Presented with transaminases.   Assessment & Plan:   Active Problems:   Carcinoma of gall bladder (HCC)   Abdominal pain   LFT elevation   Abnormal CT of the abdomen  1-Abdominal Pain ; Acute on chronic In the setting of metastatic stage IV gallbladder cancer Also concern for urine retention and constipation -Patient presented with worsening abdominal pain, inability to urinate or have a bowel movement since Sunday. -CT abdomen and pelvis: No evidence of bowel obstruction.  Hepatic metastatic disease with suspected new hepatic lesion in both hepatic lobes.  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  Stable scattered omental implants near the liver and near the transverse colon.  Stable cystic and solid mass in the central pelvis measuring 4.8 cm -Continue with MS Contin ,  oxycodone  as needed and IV morphine  PRN. - Oncology consulted.  - Continue with Robaxin  PRN  Report pain is controlled with current pain regimen.    Transaminases, Hyperbilirubinemia: In the setting of hepatic metastatic disease, CT unable to evaluate bile ducts. -US  showed cirrhosis, gallbladder wall not visualized -Follow oncology recommendation -Per radiology report there could be decrease size of portal vein, will proceed with MRCP. -MRCP: Worsening liver metastasis diseases, concern for left portal vein  thrombosis ?, hepatic biliary tree obstruction. GI has been consulted. Await oncology recommendation for ? Portal vein thrombosis.  -bilirubin increased to 5 today   -Hypertension: Continue to hold lisinopril in the setting of AKI.  As needed hydralazine  ordered   AKI:  Concern for urinary retention In the setting of poor oral intake and possible urine retention Continue with IV fluids Improved with IV fluids  Hypokalemia: Replace orally COPD: Continue Breztri  inhaler twice daily as needed nebulizer   Constipation: Continue MiraLAX , Senokot. Had BM  Oral thrush: Awaiting med rec to resume nystatin Dermatitis the setting of recent immuno chemotherapy.  Improving      Estimated body mass index is 29.08 kg/m as calculated from the following:   Height as of this encounter: 5' 2 (1.575 m).   Weight as of this encounter: 72.1 kg.   DVT prophylaxis: Lovenox  Code Status: DNR Family Communication: Husband who was at bedside Disposition Plan:  Status is: Observation The patient will require care spanning > 2 midnights and should be moved to inpatient because: Agement of pain    Consultants:  Oncology   Procedures:  US   Antimicrobials:    Subjective: Alert, report having pain at time of my evaluation, was due for pain meds. I inform nursing staff.  She had BM.    Objective: Vitals:   05/12/24 2143 05/13/24 0618 05/13/24 0816 05/13/24 1331  BP: (!) 165/68 (!) 148/70  (!) 181/80  Pulse: 88 78  (!) 103  Resp: 18 20  20   Temp: 97.8 F (36.6 C) 98 F (36.7 C)  98.7 F (37.1 C)  TempSrc: Oral Oral  Oral  SpO2: 98% 98% 98% 99%  Weight:  Height:        Intake/Output Summary (Last 24 hours) at 05/13/2024 1436 Last data filed at 05/13/2024 1410 Gross per 24 hour  Intake 754.49 ml  Output 1150 ml  Net -395.51 ml   Filed Weights   05/11/24 2101  Weight: 72.1 kg    Examination:  General exam: NAD Respiratory system: CTA Cardiovascular system: S 1, S 2  RRR Gastrointestinal system: BS present, soft, nt Central nervous system: alert Extremities: no edema   Data Reviewed: I have personally reviewed following labs and imaging studies  CBC: Recent Labs  Lab 05/11/24 1445 05/12/24 0442 05/13/24 1309  WBC 6.3 6.8 5.6  NEUTROABS 3.7  --   --   HGB 9.3* 8.7* 8.2*  HCT 27.8* 27.0* 25.7*  MCV 98.2 98.2 99.6  PLT 125* 136* 105*   Basic Metabolic Panel: Recent Labs  Lab 05/11/24 1445 05/12/24 0442 05/13/24 1309  NA 141 141 139  K 3.3* 3.3* 3.4*  CL 106 106 105  CO2 23 21* 22  GLUCOSE 95 100* 158*  BUN 16 13 10   CREATININE 1.36* 0.88 0.91  CALCIUM  8.6* 8.4* 8.4*   GFR: Estimated Creatinine Clearance: 48.1 mL/min (by C-G formula based on SCr of 0.91 mg/dL). Liver Function Tests: Recent Labs  Lab 05/11/24 1445 05/12/24 0442 05/13/24 1309  AST 116* 105* 89*  ALT 128* 123* 111*  ALKPHOS 175* 174* 185*  BILITOT 2.8* 3.9* 5.7*  PROT 5.6* 5.3* 5.2*  ALBUMIN  3.4* 3.2* 3.2*   Recent Labs  Lab 05/11/24 1445  LIPASE 24   No results for input(s): AMMONIA in the last 168 hours. Coagulation Profile: No results for input(s): INR, PROTIME in the last 168 hours. Cardiac Enzymes: No results for input(s): CKTOTAL, CKMB, CKMBINDEX, TROPONINI in the last 168 hours. BNP (last 3 results) No results for input(s): PROBNP in the last 8760 hours. HbA1C: No results for input(s): HGBA1C in the last 72 hours. CBG: No results for input(s): GLUCAP in the last 168 hours. Lipid Profile: No results for input(s): CHOL, HDL, LDLCALC, TRIG, CHOLHDL, LDLDIRECT in the last 72 hours. Thyroid  Function Tests: No results for input(s): TSH, T4TOTAL, FREET4, T3FREE, THYROIDAB in the last 72 hours. Anemia Panel: No results for input(s): VITAMINB12, FOLATE, FERRITIN, TIBC, IRON, RETICCTPCT in the last 72 hours. Sepsis Labs: No results for input(s): PROCALCITON, LATICACIDVEN in the last 168  hours.  No results found for this or any previous visit (from the past 240 hours).       Radiology Studies: US  Abdomen Limited RUQ (LIVER/GB) Result Date: 05/11/2024 EXAM: Right Upper Quadrant Abdominal Ultrasound 05/11/2024 06:56:14 PM CLINICAL HISTORY: Abnormal transaminases. TECHNIQUE: Real-time ultrasonography of the right upper quadrant of the abdomen was performed. COMPARISON: US  Abdomen Limited 12/14/2023. CT abdomen pelvis 08/24/2023. FINDINGS: LIVER: Nodular hepatic contour. Coarsened echogenicity of the hepatic parenchyma. No intrahepatic biliary ductal dilatation. No focal liver lesions identified. BILIARY SYSTEM: Gallbladder wall not visualized. The common bile duct measures 6.0 mm. OTHER: No right upper quadrant ascites. IMPRESSION: 1. Cirrhosis. No focal liver lesions identified. Liver protocol contrast-enhanced MR or CT are most sensitive for HCC screening in cirrhosis. 2. Gallbladder wall not visualized. Electronically signed by: Morgane Naveau MD 05/11/2024 07:06 PM EDT RP Workstation: [id number]   CT ABDOMEN PELVIS W CONTRAST Addendum Date: 05/11/2024 ADDENDUM #1ADDENDUM: -- Clinicians worried about biliary obstruction. There is mild central intrahepatic biliary dilatation, slightly increased when compared to the prior CT scan from 12/14/2023. The gallbladder is surgically absent. The cystic and solid appearing  process in the gallbladder fossa is likely tumor and is grossly unchanged. The necrotic-appearing periportal nodal disease maybe slightly larger. It measures approximately 3.0x2.8 cm and previously measured 2.9x2.7 cm. There may be slight narrowing of the adjacent main portal vein and there also appears to be significant new stenosis of the left portal vein. MRI/MRCP may be helpful for further evaluation. -------------------------------------------------- Electronically signed by: Maude Stammer MD 05/11/2024 07:07 PM EDT RP Workstation: [id number]   Result Date:  05/11/2024 ORIGINAL REPORTEXAM: CT ABDOMEN AND PELVIS WITH CONTRAST 05/11/2024 03:50:05 PM TECHNIQUE: CT of the abdomen and pelvis was performed with the administration of 80 mL of iohexol  (OMNIPAQUE ) 300 MG/ML solution. Multiplanar reformatted images are provided for review. Automated exposure control, iterative reconstruction, and/or weight-based adjustment of the mA/kV was utilized to reduce the radiation dose to as low as reasonably achievable. COMPARISON: PET CT 03/21/2024 and CT abdomen/pelvis 12/14/2023. CLINICAL HISTORY: Bowel obstruction suspected; LLQ abdominal pain. FINDINGS: LOWER CHEST: Persistent/stable pulmonary metastatic disease. No pleural or pericardial effusion. Stable vascular calcifications. LIVER: Hepatic metastatic disease. Suspect new hepatic lesions in both hepatic lobes. 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 Persistent mild intrahepatic biliary dilatation. Stable necrotic-appearing periportal adenopathy. GALLBLADDER AND BILE DUCTS: Surgically absent. No biliary ductal dilatation. SPLEEN: No splenic lesions. PANCREAS: The pancreas is unremarkable and stable. ADRENAL GLANDS: Bilateral adrenal gland nodules are stable. KIDNEYS, URETERS AND BLADDER: No renal lesions. The bladder is normal. No stones in the kidneys or ureters. No hydronephrosis. No perinephric or periureteral stranding. GI AND BOWEL: The stomach, duodenum, small bowel, and colon are unremarkable. No findings suspicious for bowel obstruction. Moderate stool burden. PERITONEUM AND RETROPERITONEUM: No free abdominal or free pelvic fluid. No mesenteric or retroperitoneal mass. Overall stable scattered omental implants near the liver and near the transverse colon. VASCULATURE: Stable advanced atherosclerotic calcifications involving the aorta and branch vessels but no aneurysm or dissection. The major venous structures are patent. LYMPH NODES: No lymphadenopathy. REPRODUCTIVE  ORGANS: BONES AND SOFT TISSUES: The bony structures are intact. Extensive surgical changes involving the THORACIC and LUMBAR spines without obvious complicating features. Diffuse osteoporosis. No focal soft tissue abnormality. No inguinal mass or adenopathy. IMPRESSION: 1. No CT evidence of bowel obstruction. 2. Hepatic metastatic disease with suspected new hepatic lesions in both hepatic lobes. 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 3. Stable scattered omental implants near the liver and near the transverse colon. 4. Stable cystic and solid mass in the central pelvis measuring 4.8 cm. Electronically signed by: Maude Stammer MD 05/11/2024 04:38 PM EDT RP Workstation: [id number]        Scheduled Meds:  budesonide -glycopyrrolate -formoterol   2 puff Inhalation BID   Chlorhexidine  Gluconate Cloth  6 each Topical Daily   docusate sodium   100 mg Oral BID   enoxaparin  (LOVENOX ) injection  40 mg Subcutaneous Q24H   feeding supplement  237 mL Oral BID BM   morphine   15 mg Oral Q12H   polyethylene glycol  17 g Oral BID   senna  1 tablet Oral BID   sodium phosphate   1 enema Rectal Once   Continuous Infusions:     LOS: 1 day    Time spent: 35 minutes    Ileta Ofarrell A Ruberta Holck, MD [facility]   If 7PM-7AM, please contact night-coverage [email or web address]  05/13/2024, 2:36 PM

## 2024-05-14 ENCOUNTER — Encounter (HOSPITAL_COMMUNITY): Payer: Self-pay | Admitting: Internal Medicine

## 2024-05-14 ENCOUNTER — Inpatient Hospital Stay (HOSPITAL_COMMUNITY)

## 2024-05-14 DIAGNOSIS — R935 Abnormal findings on diagnostic imaging of other abdominal regions, including retroperitoneum: Secondary | ICD-10-CM | POA: Diagnosis not present

## 2024-05-14 DIAGNOSIS — C23 Malignant neoplasm of gallbladder: Secondary | ICD-10-CM | POA: Diagnosis not present

## 2024-05-14 DIAGNOSIS — R7989 Other specified abnormal findings of blood chemistry: Secondary | ICD-10-CM | POA: Diagnosis not present

## 2024-05-14 DIAGNOSIS — R109 Unspecified abdominal pain: Secondary | ICD-10-CM | POA: Diagnosis not present

## 2024-05-14 HISTORY — PX: IR BILIARY DRAIN PLACEMENT WITH CHOLANGIOGRAM: IMG6043

## 2024-05-14 LAB — CBC
HCT: 26.7 % — ABNORMAL LOW (ref 36.0–46.0)
Hemoglobin: 8.5 g/dL — ABNORMAL LOW (ref 12.0–15.0)
MCH: 32.1 pg (ref 26.0–34.0)
MCHC: 31.8 g/dL (ref 30.0–36.0)
MCV: 100.8 fL — ABNORMAL HIGH (ref 80.0–100.0)
Platelets: 110 K/uL — ABNORMAL LOW (ref 150–400)
RBC: 2.65 MIL/uL — ABNORMAL LOW (ref 3.87–5.11)
RDW: 21.9 % — ABNORMAL HIGH (ref 11.5–15.5)
WBC: 7.8 K/uL (ref 4.0–10.5)
nRBC: 0 % (ref 0.0–0.2)

## 2024-05-14 LAB — HEPATIC FUNCTION PANEL
ALT: 103 U/L — ABNORMAL HIGH (ref 0–44)
AST: 79 U/L — ABNORMAL HIGH (ref 15–41)
Albumin: 3.1 g/dL — ABNORMAL LOW (ref 3.5–5.0)
Alkaline Phosphatase: 191 U/L — ABNORMAL HIGH (ref 38–126)
Bilirubin, Direct: 5 mg/dL — ABNORMAL HIGH (ref 0.0–0.2)
Indirect Bilirubin: 1.8 mg/dL — ABNORMAL HIGH (ref 0.3–0.9)
Total Bilirubin: 6.8 mg/dL — ABNORMAL HIGH (ref 0.0–1.2)
Total Protein: 5.2 g/dL — ABNORMAL LOW (ref 6.5–8.1)

## 2024-05-14 LAB — PROTIME-INR
INR: 1.2 (ref 0.8–1.2)
Prothrombin Time: 16 s — ABNORMAL HIGH (ref 11.4–15.2)

## 2024-05-14 LAB — BASIC METABOLIC PANEL WITH GFR
Anion gap: 10 (ref 5–15)
BUN: 11 mg/dL (ref 8–23)
CO2: 23 mmol/L (ref 22–32)
Calcium: 8.3 mg/dL — ABNORMAL LOW (ref 8.9–10.3)
Chloride: 105 mmol/L (ref 98–111)
Creatinine, Ser: 0.81 mg/dL (ref 0.44–1.00)
GFR, Estimated: >60 mL/min (ref >60)
Glucose, Bld: 108 mg/dL — ABNORMAL HIGH (ref 70–99)
Potassium: 3.9 mmol/L (ref 3.5–5.1)
Sodium: 138 mmol/L (ref 135–145)

## 2024-05-14 MED ORDER — LACTATED RINGERS IV SOLN
INTRAVENOUS | Status: AC
  Administered 2024-05-15: 75 mL/h via INTRAVENOUS

## 2024-05-14 MED ORDER — MIDAZOLAM HCL 2 MG/2ML IJ SOLN
INTRAMUSCULAR | Status: AC
  Filled 2024-05-14: qty 2

## 2024-05-14 MED ORDER — SODIUM CHLORIDE 0.9 % IV SOLN
INTRAVENOUS | Status: AC
  Filled 2024-05-14: qty 2

## 2024-05-14 MED ORDER — FENTANYL CITRATE (PF) 100 MCG/2ML IJ SOLN
INTRAMUSCULAR | Status: AC | PRN
  Administered 2024-05-14 (×4): 25 ug via INTRAVENOUS
  Administered 2024-05-14: 50 ug via INTRAVENOUS

## 2024-05-14 MED ORDER — LIDOCAINE HCL 1 % IJ SOLN
INTRAMUSCULAR | Status: AC
  Filled 2024-05-14: qty 20

## 2024-05-14 MED ORDER — FENTANYL CITRATE (PF) 100 MCG/2ML IJ SOLN
INTRAMUSCULAR | Status: AC
  Filled 2024-05-14: qty 2

## 2024-05-14 MED ORDER — IOHEXOL 300 MG/ML  SOLN
50.0000 mL | Freq: Once | INTRAMUSCULAR | Status: AC | PRN
Start: 2024-05-14 — End: 2024-05-14
  Administered 2024-05-14: 15 mL

## 2024-05-14 MED ORDER — SODIUM CHLORIDE 0.9 % IV SOLN
2.0000 g | Freq: Once | INTRAVENOUS | Status: AC
  Administered 2024-05-14: 2 g via INTRAVENOUS

## 2024-05-14 MED ORDER — LIDOCAINE HCL 1 % IJ SOLN
20.0000 mL | Freq: Once | INTRAMUSCULAR | Status: AC
  Administered 2024-05-14: 10 mL via INTRADERMAL
  Filled 2024-05-14: qty 20

## 2024-05-14 MED ORDER — MIDAZOLAM HCL (PF) 2 MG/2ML IJ SOLN
INTRAMUSCULAR | Status: AC | PRN
  Administered 2024-05-14 (×3): 1 mg via INTRAVENOUS
  Administered 2024-05-14 (×2): .5 mg via INTRAVENOUS

## 2024-05-14 NOTE — Progress Notes (Signed)
 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  CT abdomen and pelvis was negative for obstruction but showed 2 new lesion in the hepatic lobe.  Presented with transaminases.   Assessment & Plan:   Active Problems:   Carcinoma of gall bladder (HCC)   Abdominal pain   LFT elevation   Abnormal CT of the abdomen  1-Abdominal Pain ; Acute on chronic In the setting of metastatic stage IV gallbladder cancer Concern for urine retention and Constipation -Patient presented with worsening abdominal pain, inability to urinate or have a bowel movement since Sunday. -CT abdomen and pelvis: No evidence of bowel obstruction.  Hepatic metastatic disease with suspected new hepatic lesion in both hepatic lobes.  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  Stable scattered omental implants near the liver and near the transverse colon.  Stable cystic and solid mass in the central pelvis measuring 4.8 cm -Continue with MS Contin ,  oxycodone  as needed and IV morphine  PRN. - Oncology consulted and following.  - Continue with Robaxin  PRN  Report pain is controlled with current pain regimen.  IR consulted for Biliary drain placement.   Transaminases, Hyperbilirubinemia: In the setting of hepatic metastatic disease, CT unable to evaluate bile ducts. -US  showed cirrhosis, gallbladder wall not visualized -Follow oncology recommendation -Per radiology report could be decrease size of portal vein, MRCP ordered.  -MRCP: Worsening liver metastasis  diseases, concern for left portal vein thrombosis ?, hepatic biliary tree obstruction. GI has been consulted, not candidate for ERCP stent placement obstruction is too high in the liver. IR consulted for external drain placement.  -Await oncology recommendation for ? Portal vein thrombosis.    -Hypertension: Continue to hold lisinopril in the setting of AKI.  As needed hydralazine  ordered   AKI:  Concern for urinary retention In the setting of poor oral intake and possible urine retention Continue with IV fluids Improved with IV fluids  Hypokalemia: Replaced COPD: Continue Breztri  inhaler twice daily as needed nebulizer   Constipation: Continue MiraLAX , Senokot. Had BM 10/30/ added lactulose.  Oral thrush: Awaiting med rec to resume nystatin Dermatitis the setting of recent immuno chemotherapy.  Improving      Estimated body mass index is 29.08 kg/m as calculated from the following:   Height as of this encounter: 5' 2 (1.575 m).   Weight as of this encounter: 72.1 kg.   DVT prophylaxis: Lovenox  Code Status: DNR Family Communication: Husband who was at bedside and daughter  Disposition Plan:  Status is: Observation The patient will require care spanning > 2 midnights and should be moved to inpatient because: Agement of pain    Consultants:  Oncology   Procedures:  US   Antimicrobials:    Subjective: Alert, report pain medications has been controlling pain no BM yesterday   Objective: Vitals:   05/14/24 1340 05/14/24 1345 05/14/24 1350 05/14/24 1355  BP: 139/75 108/65 (!) 157/121 (!) 151/72  Pulse: 84 93 86 85  Resp: 12 11 15 14   Temp:      TempSrc:      SpO2: 99% 99% 99% 99%  Weight:  Height:        Intake/Output Summary (Last 24 hours) at 05/14/2024 1357 Last data filed at 05/14/2024 1120 Gross per 24 hour  Intake 120 ml  Output 1600 ml  Net -1480 ml   Filed Weights   05/11/24 2101  Weight: 72.1 kg    Examination:  General exam: NAD,  Itcteric Respiratory system: CTA Cardiovascular system: S 1, S 2 RRR Gastrointestinal system: BS present, soft, nt Central nervous system: Alert, follows command Extremities: no edema   Data Reviewed: I have personally reviewed following labs and imaging studies  CBC: Recent Labs  Lab 05/11/24 1445 05/12/24 0442 05/13/24 1309 05/14/24 0420  WBC 6.3 6.8 5.6 7.8  NEUTROABS 3.7  --   --   --   HGB 9.3* 8.7* 8.2* 8.5*  HCT 27.8* 27.0* 25.7* 26.7*  MCV 98.2 98.2 99.6 100.8*  PLT 125* 136* 105* 110*   Basic Metabolic Panel: Recent Labs  Lab 05/11/24 1445 05/12/24 0442 05/13/24 1309 05/14/24 0420  NA 141 141 139 138  K 3.3* 3.3* 3.4* 3.9  CL 106 106 105 105  CO2 23 21* 22 23  GLUCOSE 95 100* 158* 108*  BUN 16 13 10 11   CREATININE 1.36* 0.88 0.91 0.81  CALCIUM  8.6* 8.4* 8.4* 8.3*   GFR: Estimated Creatinine Clearance: 54.1 mL/min (by C-G formula based on SCr of 0.81 mg/dL). Liver Function Tests: Recent Labs  Lab 05/11/24 1445 05/12/24 0442 05/13/24 1309 05/14/24 0420  AST 116* 105* 89* 79*  ALT 128* 123* 111* 103*  ALKPHOS 175* 174* 185* 191*  BILITOT 2.8* 3.9* 5.7* 6.8*  PROT 5.6* 5.3* 5.2* 5.2*  ALBUMIN  3.4* 3.2* 3.2* 3.1*   Recent Labs  Lab 05/11/24 1445  LIPASE 24   No results for input(s): AMMONIA in the last 168 hours. Coagulation Profile: Recent Labs  Lab 05/14/24 0420  INR 1.2   Cardiac Enzymes: No results for input(s): CKTOTAL, CKMB, CKMBINDEX, TROPONINI in the last 168 hours. BNP (last 3 results) No results for input(s): PROBNP in the last 8760 hours. HbA1C: No results for input(s): HGBA1C in the last 72 hours. CBG: No results for input(s): GLUCAP in the last 168 hours. Lipid Profile: No results for input(s): CHOL, HDL, LDLCALC, TRIG, CHOLHDL, LDLDIRECT in the last 72 hours. Thyroid  Function Tests: No results for input(s): TSH, T4TOTAL, FREET4, T3FREE, THYROIDAB in the last 72 hours. Anemia  Panel: No results for input(s): VITAMINB12, FOLATE, FERRITIN, TIBC, IRON, RETICCTPCT in the last 72 hours. Sepsis Labs: No results for input(s): PROCALCITON, LATICACIDVEN in the last 168 hours.  No results found for this or any previous visit (from the past 240 hours).       Radiology Studies: US  Abdomen Limited RUQ (LIVER/GB) Result Date: 05/11/2024 EXAM: Right Upper Quadrant Abdominal Ultrasound 05/11/2024 06:56:14 PM CLINICAL HISTORY: Abnormal transaminases. TECHNIQUE: Real-time ultrasonography of the right upper quadrant of the abdomen was performed. COMPARISON: US  Abdomen Limited 12/14/2023. CT abdomen pelvis 08/24/2023. FINDINGS: LIVER: Nodular hepatic contour. Coarsened echogenicity of the hepatic parenchyma. No intrahepatic biliary ductal dilatation. No focal liver lesions identified. BILIARY SYSTEM: Gallbladder wall not visualized. The common bile duct measures 6.0 mm. OTHER: No right upper quadrant ascites. IMPRESSION: 1. Cirrhosis. No focal liver lesions identified. Liver protocol contrast-enhanced MR or CT are most sensitive for HCC screening in cirrhosis. 2. Gallbladder wall not visualized. Electronically signed by: Morgane Naveau MD 05/11/2024 07:06 PM EDT RP Workstation: [id number]   CT ABDOMEN PELVIS W CONTRAST Addendum Date: 05/11/2024 ADDENDUM #1ADDENDUM: -- Clinicians worried  about biliary obstruction. There is mild central intrahepatic biliary dilatation, slightly increased when compared to the prior CT scan from 12/14/2023. The gallbladder is surgically absent. The cystic and solid appearing process in the gallbladder fossa is likely tumor and is grossly unchanged. The necrotic-appearing periportal nodal disease maybe slightly larger. It measures approximately 3.0x2.8 cm and previously measured 2.9x2.7 cm. There may be slight narrowing of the adjacent main portal vein and there also appears to be significant new stenosis of the left portal vein. MRI/MRCP may be  helpful for further evaluation. -------------------------------------------------- Electronically signed by: Maude Stammer MD 05/11/2024 07:07 PM EDT RP Workstation: [id number]   Result Date: 05/11/2024 ORIGINAL REPORTEXAM: CT ABDOMEN AND PELVIS WITH CONTRAST 05/11/2024 03:50:05 PM TECHNIQUE: CT of the abdomen and pelvis was performed with the administration of 80 mL of iohexol  (OMNIPAQUE ) 300 MG/ML solution. Multiplanar reformatted images are provided for review. Automated exposure control, iterative reconstruction, and/or weight-based adjustment of the mA/kV was utilized to reduce the radiation dose to as low as reasonably achievable. COMPARISON: PET CT 03/21/2024 and CT abdomen/pelvis 12/14/2023. CLINICAL HISTORY: Bowel obstruction suspected; LLQ abdominal pain. FINDINGS: LOWER CHEST: Persistent/stable pulmonary metastatic disease. No pleural or pericardial effusion. Stable vascular calcifications. LIVER: Hepatic metastatic disease. Suspect new hepatic lesions in both hepatic lobes. 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 Persistent mild intrahepatic biliary dilatation. Stable necrotic-appearing periportal adenopathy. GALLBLADDER AND BILE DUCTS: Surgically absent. No biliary ductal dilatation. SPLEEN: No splenic lesions. PANCREAS: The pancreas is unremarkable and stable. ADRENAL GLANDS: Bilateral adrenal gland nodules are stable. KIDNEYS, URETERS AND BLADDER: No renal lesions. The bladder is normal. No stones in the kidneys or ureters. No hydronephrosis. No perinephric or periureteral stranding. GI AND BOWEL: The stomach, duodenum, small bowel, and colon are unremarkable. No findings suspicious for bowel obstruction. Moderate stool burden. PERITONEUM AND RETROPERITONEUM: No free abdominal or free pelvic fluid. No mesenteric or retroperitoneal mass. Overall stable scattered omental implants near the liver and near the transverse colon. VASCULATURE: Stable  advanced atherosclerotic calcifications involving the aorta and branch vessels but no aneurysm or dissection. The major venous structures are patent. LYMPH NODES: No lymphadenopathy. REPRODUCTIVE ORGANS: BONES AND SOFT TISSUES: The bony structures are intact. Extensive surgical changes involving the THORACIC and LUMBAR spines without obvious complicating features. Diffuse osteoporosis. No focal soft tissue abnormality. No inguinal mass or adenopathy. IMPRESSION: 1. No CT evidence of bowel obstruction. 2. Hepatic metastatic disease with suspected new hepatic lesions in both hepatic lobes. 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 3. Stable scattered omental implants near the liver and near the transverse colon. 4. Stable cystic and solid mass in the central pelvis measuring 4.8 cm. Electronically signed by: Maude Stammer MD 05/11/2024 04:38 PM EDT RP Workstation: [id number]        Scheduled Meds:  budesonide -glycopyrrolate -formoterol   2 puff Inhalation BID   Chlorhexidine  Gluconate Cloth  6 each Topical Daily   docusate sodium   100 mg Oral BID   enoxaparin  (LOVENOX ) injection  40 mg Subcutaneous Q24H   feeding supplement  237 mL Oral BID BM   lactulose  20 g Oral BID   morphine   15 mg Oral Q12H   polyethylene glycol  17 g Oral BID   senna  1 tablet Oral BID   sodium phosphate   1 enema Rectal Once   Continuous Infusions:     LOS: 2 days    Time spent: 35 minutes    Worth Kober A  Tedford Berg, MD [facility]   If 7PM-7AM, please contact night-coverage [email or web address]  05/14/2024, 1:57 PM

## 2024-05-14 NOTE — Progress Notes (Signed)
 IP PROGRESS NOTE  Subjective:   Janice Pace reports abdominal pain.  The rash is improving.  No new complaint.  Objective: Vital signs in last 24 hours: Blood pressure (!) 147/77, pulse 90, temperature 98.6 F (37 C), temperature source Oral, resp. rate 14, height 5' 2 (1.575 m), weight 159 lb (72.1 kg), SpO2 98%.  Intake/Output from previous day: 10/31 0701 - 11/01 0700 In: 120 [P.O.:120] Out: 1350 [Urine:1350]  Physical Exam:  Abdomen: Distended, tender in the right abdomen Skin: No jaundice, fading rash over the trunk and extremities   Portacath/PICC-without erythema  Lab Results: Recent Labs    05/13/24 1309 05/14/24 0420  WBC 5.6 7.8  HGB 8.2* 8.5*  HCT 25.7* 26.7*  PLT 105* 110*    BMET Recent Labs    05/13/24 1309 05/14/24 0420  NA 139 138  K 3.4* 3.9  CL 105 105  CO2 22 23  GLUCOSE 158* 108*  BUN 10 11  CREATININE 0.91 0.81  CALCIUM  8.4* 8.3*    Lab Results  Component Value Date   CEA1 98.7 (H) 12/14/2023   CEA 159.82 (H) 03/31/2024   RJW800 8,324 (H) 03/31/2024    Studies/Results: MR ABDOMEN MRCP W WO CONTAST Result Date: 05/13/2024 CLINICAL DATA:  Epigastric pain 114842 Epigastric pain 114842; Epigastric pain. EXAM: MRI ABDOMEN WITHOUT AND WITH CONTRAST (INCLUDING MRCP) TECHNIQUE: Multiplanar multisequence MR imaging of the abdomen was performed both before and after the administration of intravenous contrast. Heavily T2-weighted images of the biliary and pancreatic ducts were obtained, and three-dimensional MRCP images were rendered by post processing. CONTRAST:  7mL GADAVIST  GADOBUTROL  1 MMOL/ML IV SOLN COMPARISON:  Ultrasound abdomen and CT scan abdomen and pelvis from 05/11/2024. FINDINGS: Lower chest: Unremarkable MR appearance to the lung bases. No pleural effusion. No pericardial effusion. Normal heart size. Hepatobiliary: The liver is moderately enlarged in size measuring up to 17.7 cm in length. Noncirrhotic configuration. There are  multiple (around 10), T2 hyperintense,, peripherally rim enhancing target like lesions throughout liver, which have increased in size and number since the prior study and compatible with worsening metastatic disease. There is redemonstration of heterogeneous contracted gallbladder with ill-defined heterogeneous peripherally irregular wall enhancing lesion in the fundus of the gallbladder which appears to infiltrate into the left hepatic lobe, segment 4B, suggesting infiltrating tumor. The lesion measures up to 2.9 x 3.1 cm, grossly similar to the prior study., There is mild-to-moderate central intrahepatic bile duct dilation. There is abrupt narrowing/cutoff of the proximal extrahepatic bile duct. The mid/distal extrahepatic bile duct is nondilated. There is ill-defined peripheral irregular wall enhancing mass in the caudate lobe which is likely the cause of abrupt cutoff of the central biliary tree. Findings may be due to primary hilar cholangiocarcinoma versus metastases. The middle and right intrahepatic veins are patent. Left hepatic vein is not well seen, likely thrombosed. There is moderate narrowing in the midportion of main portal vein (series 19, image 31). Right portal vein and its major tributaries are patent. However, there is nonvisualization of left portal vein, suggesting thrombosis. These findings are new since the prior study from 12/14/2023. Pancreas: No mass, inflammatory changes or other parenchymal abnormality identified. No main pancreatic duct dilation. Spleen: Top-normal spleen measuring upto 5.5 x 12.9 orthogonally on coronal plane. No focal mass. Adrenals/Urinary Tract: Bilateral adrenal adenomas noted measuring 1.1 x 1.5 cm on the left side and 1.0 x 1.5 cm on the right side. No hydroureteronephrosis. No suspicious renal mass. Stomach/Bowel: Visualized portions within the abdomen are unremarkable.  No disproportionate dilation of bowel loops. Vascular/Lymphatic: No pathologically enlarged  lymph nodes identified. No abdominal aortic aneurysm demonstrated. No ascites. Redemonstration of multiple nodular implants throughout the abdomen, compatible with peritoneal carcinomatosis. Overall, there is mild interval increase in the size, suggesting worsening peritoneal carcinomatosis. Other: Susceptibility artifact from thoracolumbar spine spinal fixation hardware noted. Musculoskeletal: No suspicious bone lesions identified. IMPRESSION: 1. There is redemonstration of heterogeneous contracted gallbladder with ill-defined heterogeneous peripherally irregular wall enhancing lesion in the fundus of the gallbladder which appears to infiltrate into the left hepatic lobe, segment 4B, suggesting infiltrating tumor. 2. There are multiple (around 10), T2 hyperintense, peripherally rim enhancing target like lesions throughout liver, which have increased in size and number since the prior study and compatible with worsening metastatic disease. 3. There is mild-to-moderate central intrahepatic bile duct dilation. There is abrupt narrowing/cutoff of the proximal extrahepatic bile duct. The mid/distal extrahepatic bile duct is nondilated. There is ill-defined peripheral irregular wall enhancing mass in the caudate lobe which is likely the cause of abrupt cutoff of the central biliary tree. Findings may be due to primary hilar cholangiocarcinoma versus metastases. 4. There is moderate narrowing in the midportion of main portal vein. Right portal vein and its major tributaries are patent. However, there is nonvisualization of left portal vein, suggesting thrombosis. There is also new nonvisualization of left hepatic vein. These findings are new since the prior study from 12/14/2023. 5. Bilateral adrenal adenomas. Electronically Signed   By: Ree Molt M.D.   On: 05/13/2024 13:26   MR 3D Recon At Scanner Result Date: 05/13/2024 CLINICAL DATA:  Epigastric pain 114842 Epigastric pain 114842; Epigastric pain. EXAM: MRI  ABDOMEN WITHOUT AND WITH CONTRAST (INCLUDING MRCP) TECHNIQUE: Multiplanar multisequence MR imaging of the abdomen was performed both before and after the administration of intravenous contrast. Heavily T2-weighted images of the biliary and pancreatic ducts were obtained, and three-dimensional MRCP images were rendered by post processing. CONTRAST:  7mL GADAVIST  GADOBUTROL  1 MMOL/ML IV SOLN COMPARISON:  Ultrasound abdomen and CT scan abdomen and pelvis from 05/11/2024. FINDINGS: Lower chest: Unremarkable MR appearance to the lung bases. No pleural effusion. No pericardial effusion. Normal heart size. Hepatobiliary: The liver is moderately enlarged in size measuring up to 17.7 cm in length. Noncirrhotic configuration. There are multiple (around 10), T2 hyperintense,, peripherally rim enhancing target like lesions throughout liver, which have increased in size and number since the prior study and compatible with worsening metastatic disease. There is redemonstration of heterogeneous contracted gallbladder with ill-defined heterogeneous peripherally irregular wall enhancing lesion in the fundus of the gallbladder which appears to infiltrate into the left hepatic lobe, segment 4B, suggesting infiltrating tumor. The lesion measures up to 2.9 x 3.1 cm, grossly similar to the prior study., There is mild-to-moderate central intrahepatic bile duct dilation. There is abrupt narrowing/cutoff of the proximal extrahepatic bile duct. The mid/distal extrahepatic bile duct is nondilated. There is ill-defined peripheral irregular wall enhancing mass in the caudate lobe which is likely the cause of abrupt cutoff of the central biliary tree. Findings may be due to primary hilar cholangiocarcinoma versus metastases. The middle and right intrahepatic veins are patent. Left hepatic vein is not well seen, likely thrombosed. There is moderate narrowing in the midportion of main portal vein (series 19, image 31). Right portal vein and its  major tributaries are patent. However, there is nonvisualization of left portal vein, suggesting thrombosis. These findings are new since the prior study from 12/14/2023. Pancreas: No mass, inflammatory changes or other parenchymal  abnormality identified. No main pancreatic duct dilation. Spleen: Top-normal spleen measuring upto 5.5 x 12.9 orthogonally on coronal plane. No focal mass. Adrenals/Urinary Tract: Bilateral adrenal adenomas noted measuring 1.1 x 1.5 cm on the left side and 1.0 x 1.5 cm on the right side. No hydroureteronephrosis. No suspicious renal mass. Stomach/Bowel: Visualized portions within the abdomen are unremarkable. No disproportionate dilation of bowel loops. Vascular/Lymphatic: No pathologically enlarged lymph nodes identified. No abdominal aortic aneurysm demonstrated. No ascites. Redemonstration of multiple nodular implants throughout the abdomen, compatible with peritoneal carcinomatosis. Overall, there is mild interval increase in the size, suggesting worsening peritoneal carcinomatosis. Other: Susceptibility artifact from thoracolumbar spine spinal fixation hardware noted. Musculoskeletal: No suspicious bone lesions identified. IMPRESSION: 1. There is redemonstration of heterogeneous contracted gallbladder with ill-defined heterogeneous peripherally irregular wall enhancing lesion in the fundus of the gallbladder which appears to infiltrate into the left hepatic lobe, segment 4B, suggesting infiltrating tumor. 2. There are multiple (around 10), T2 hyperintense, peripherally rim enhancing target like lesions throughout liver, which have increased in size and number since the prior study and compatible with worsening metastatic disease. 3. There is mild-to-moderate central intrahepatic bile duct dilation. There is abrupt narrowing/cutoff of the proximal extrahepatic bile duct. The mid/distal extrahepatic bile duct is nondilated. There is ill-defined peripheral irregular wall enhancing mass  in the caudate lobe which is likely the cause of abrupt cutoff of the central biliary tree. Findings may be due to primary hilar cholangiocarcinoma versus metastases. 4. There is moderate narrowing in the midportion of main portal vein. Right portal vein and its major tributaries are patent. However, there is nonvisualization of left portal vein, suggesting thrombosis. There is also new nonvisualization of left hepatic vein. These findings are new since the prior study from 12/14/2023. 5. Bilateral adrenal adenomas. Electronically Signed   By: Ree Molt M.D.   On: 05/13/2024 13:26    Medications: I have reviewed the patient's current medications.  Assessment/Plan:  Metastatic gallbladder carcinoma, status post treatment with gemcitabine /cisplatin /durvalumab , last given on 04/22/2024 05/11/2024 CT abdomen/pelvis: Mild central intrahepatic biliary dilation, necrotic periportal node, significant new stenosis of the left portal vein, new hepatic metastases 05/13/2024 MRI/MRCP: Irregular gallbladder wall with infiltration into the left hepatic lobe, progressive hepatic metastases, mild to moderate central intrahepatic bile duct dilation with abrupt cut off of the proximal extrahepatic bile duct, wall enhancing mass in the caudate lobe felt to be the cause of the biliary obstruction, moderate narrowing in the portal vein, nonvisualization of the left portal vein and left hepatic vein 2.  Biliary obstruction secondary to #1 3.  Abdominal pain secondary to #1 4.  Rash-likely secondary to Durvalumab -improving 5.  COPD 6.  Anemia/Thrombocytopenia 7.  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  She has been treated with gemcitabine /cisplatin /durvalumab .  There is radiologic evidence of disease progression.  I discussed the MRI findings with her.  I reviewed previous molecular testing.  She does not appear to have a targetable mutation such  as a HER2 amplification or FGFR alteration.  Dr. Autumn will decide on salvage systemic therapy versus hospice care.  I will discuss the indication for anticoagulation therapy with the GI team.   Recommendations: Consult interventional radiology for placement of an external biliary drain Narcotic analgesics for pain Please call oncology as needed on 05/14/2024.  Dr.Pasam will return on 05/15/2024.    LOS: 2 days   Arley Hof, MD   05/14/2024, 8:14 AM

## 2024-05-14 NOTE — Sedation Documentation (Signed)
 MD unable to place drain due to narrowing of the duct.

## 2024-05-14 NOTE — Plan of Care (Signed)
  Problem: Education: Goal: Knowledge of General Education information will improve Description: Including pain rating scale, medication(s)/side effects and non-pharmacologic comfort measures Outcome: Progressing   Problem: Clinical Measurements: Goal: Will remain free from infection Outcome: Progressing Goal: Cardiovascular complication will be avoided Outcome: Progressing   Problem: Health Behavior/Discharge Planning: Goal: Ability to manage health-related needs will improve Outcome: Not Progressing   Problem: Clinical Measurements: Goal: Ability to maintain clinical measurements within normal limits will improve Outcome: Not Progressing Goal: Diagnostic test results will improve Outcome: Not Progressing Goal: Respiratory complications will improve Outcome: Not Progressing

## 2024-05-14 NOTE — Procedures (Signed)
 Interventional Radiology Procedure:   Indications: Gallbladder cancer with biliary dilatation  Procedure: PTC and attempted drain placement  Findings: Mild intrahepatic biliary dilatation.  Opacified a few peripheral ducts and contrast drained into CBD.  Evidence for partial central biliary obstruction.  Unable to get a wire into a peripheral biliary duct due to small size.     Complications: None     EBL: Minimal  Plan:  Bedrest 3 hours.  Follow labs.  If bilirubin continues to increase and internal bile ducts enlarge, we can attempt another biliary drain placement.    Janice Mell R. Philip, MD  Pager: [phone]

## 2024-05-14 NOTE — Consult Note (Signed)
 Chief Complaint: Patient was seen in consultation today for bile duct dilation Chief Complaint  Patient presents with   Abdominal Pain   at the request of Regalado, Belkys/ Cloretta Kuba   Referring Physician(s): Regalado, Belkys/ Cloretta Kuba   Supervising Physician: Philip Cornet  Patient Status: [facility] - In-pt  History of Present Illness: Janice Pace is a 78 y.o. 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   Patient has been followed by oncology for her stage IV gallbladder CA and PET on 03/22/19/2025 show overall improvement. Patient presented to ED on 10/29 with uncontrollable abdominal pain, work up unfortunately showed progression of the cancer with obstructing liver lass with  mild-to-moderate central intrahepatic bile duct dilation. Lab showed worsening hyperbilirubinemia.   GI was consulted who deemed patient not a candidate for ERCP/bili stent placement the location of the obstruction and overall anatomy.  IR was consulted for biliary drain placement, case was reviewed by Dr. Philip who stated that it will be challenging but IR will attempt the bili drain placement. Patient was seen by oncology as well who also recommended bili drain placement to optimizer patient condition for possible chemotherapy.  Patient laying in bed, not in acute distress. Husband, daughter, and pastor at bedside. Denise headache, fever, chills, shortness of breath, cough, chest pain, abdominal pain, nausea ,vomiting, and bleeding.  Had lengthy discussion regarding the bili drain placement, patient was informed that IR may not be successful and if successful, patient will most likely need to have the drain in place indefinitely. All questions from the patient and family answered.  Patient  currently has DNR order in place. Discussion with the patient and family regarding wishes. Again after thorough discussion with the patient and her family it was determined that:   The original DNR order is maintained during the procedure and prior treatment limitations are upheld during the procedure.    Past Medical History:  Diagnosis Date   Arthritis    Asthma    Bronchitis    Cancer (HCC)    dermatosivros arcoma and protuverans   Carpal tunnel syndrome, left upper limb 11/06/2017   Chronic back pain    degenerative disc disease   Constipation    stool softener daily   Constipation    COPD (chronic obstructive pulmonary disease) (HCC)    GERD (gastroesophageal reflux disease)    takes Prilosec daily   GERD (gastroesophageal reflux disease)    Heart murmur    Heart valve disorder    Hemorrhoids    History of hiatal hernia    Ileus (HCC) 03/02/2021   Insomnia    d/t chantix    Joint pain    Nocturia    Ovarian failure 05/28/2022   Personal history of colonic adenoma 03/13/2003   03/13/2003 - 5 mm adenoma   Pre-diabetes    Prediabetes    White coat syndrome without diagnosis of hypertension     Past Surgical History:  Procedure Laterality Date   ABDOMINAL HYSTERECTOMY     ANTERIOR LAT LUMBAR FUSION N/A 02/27/2021   Procedure: Anterior lateral interbody fusion - lateral two - lateral three - lateral three - lateral four with exploration fusion L4-S1;  Surgeon: Onetha Kuba, MD;  Location: [facility] OR;  Service: Neurosurgery;  Laterality: N/A;   BACK SURGERY  1988   BACK SURGERY  09/03/2011   rods,screws x 8    BACK SURGERY     09/2022   BUNIONECTOMY     bilateral   BUNIONECTOMY     bil feet   COLONOSCOPY     dermatosibrosarcoma protuberans     IR IMAGING GUIDED PORT INSERTION  01/07/2024   LAMINECTOMY WITH POSTERIOR LATERAL ARTHRODESIS LEVEL 3 N/A 02/27/2021   Procedure: Posterior augmentation with pedicle screws Latreal one - lateral four - Posterior Lateral and  Interbody fusion;  Surgeon: Onetha Kuba, MD;  Location: [facility] OR;  Service: Neurosurgery;  Laterality: N/A;   PARTIAL HYSTERECTOMY     RECTOCELE REPAIR     Rectum Repair     RESECTION TUMOR WRIST RADICAL     x 2 rt   TUBAL LIGATION  1974   TUBAL LIGATION     WRIST SURGERY     left    Allergies: Sulfa antibiotics, Forteo  [parathyroid hormone (recomb)], Atorvastatin, and Metformin hcl  Medications: Prior to Admission medications   Medication Sig Start Date End Date Taking? Authorizing Provider  albuterol  (VENTOLIN  HFA) 108 (90 Base) MCG/ACT inhaler Inhale 1-2 puffs into the lungs every 6 (six) hours as needed for wheezing or shortness of breath. 04/11/24  Yes Kara Dorn NOVAK, MD  aspirin  81 MG EC tablet Take 81 mg by mouth daily.   Yes [provider]  budesonide -glycopyrrolate -formoterol  (BREZTRI  AEROSPHERE) 160-9-4.8 MCG/ACT AERO inhaler Inhale 2 puffs into the lungs in the morning and at bedtime. 04/11/24  Yes Kara Dorn NOVAK, MD  docusate sodium  (COLACE) 100 MG capsule Take 500 mg by mouth at bedtime.   Yes [provider]  fluconazole  (DIFLUCAN ) 200 MG tablet Take 1 tablet (200 mg total) by mouth daily. 04/26/24  Yes Pasam, Avinash, MD  lidocaine -prilocaine  (EMLA ) cream Apply to affected area once Patient taking differently: Apply 1 Application topically as needed (for port access). 12/24/23  Yes Pasam, Avinash, MD  loratadine  (CLARITIN ) 10 MG tablet Take 10 mg by mouth See admin instructions. Take 10 mg by mouth once a day on non-chemo weeks   Yes [provider]  losartan (COZAAR) 50 MG tablet Take 1 tablet (50 mg total) by mouth daily. 05/09/24  Yes Gayle Numbers F, PA-C  magic mouthwash (nystatin, lidocaine , diphenhydrAMINE , alum & mag hydroxide) suspension Take 5 mLs by mouth every 4 (four) hours as needed for mouth pain. Suspension contains equal amounts of Maalox, nystatin, diphenhydramine  and lidocaine . 1:1:1:1 ratio. 04/28/24  Yes Pasam, Avinash, MD   magnesium  oxide (MAG-OX) 400 (240 Mg) MG tablet Take 1 tablet (400 mg total) by mouth 2 (two) times daily. 05/04/24  Yes Pasam, Avinash, MD  megestrol  (MEGACE  ES) 625 MG/5ML suspension Take 5 mLs (625 mg total) by mouth daily. 01/27/24  Yes Pasam, Avinash, MD  metoprolol  succinate (TOPROL -XL) 50 MG 24 hr tablet TAKE 1 TABLET BY MOUTH ONCE DAILY. TAKE WITH OR IMMEDIATELY FOLLOWING A MEAL Patient taking differently: Take 50 mg by mouth daily after breakfast. 11/11/23  Yes Chandra Toribio POUR, MD  morphine  (MS CONTIN ) 15 MG 12 hr tablet Take 1 tablet (15 mg total) by mouth every 12 (twelve) hours. 05/06/24  Yes Pasam, Avinash, MD  omeprazole  (PRILOSEC) 20 MG capsule Take 1 capsule (20 mg total) by mouth daily. Patient taking differently: Take 20 mg by mouth See admin instructions. Take 20 mg by mouth in the morning and evening- 30 minutes before a meal 05/02/24  Yes Clapp, Kara F, PA-C  ondansetron  (ZOFRAN ) 8 MG tablet Take 1 tablet (  8 mg total) by mouth every 8 (eight) hours as needed for nausea or vomiting. Start on the third day after cisplatin . 12/24/23  Yes Pasam, Avinash, MD  oxyCODONE  10 MG TABS Take 1-1.5 tablets (10-15 mg total) by mouth every 4 (four) hours as needed for moderate pain (pain score 4-6) or severe pain (pain score 7-10). 12/16/23  Yes Laurence Locus, DO  polyethylene glycol (MIRALAX  / GLYCOLAX ) 17 g packet Take 17 g by mouth 2 (two) times daily as needed for mild constipation or moderate constipation.   Yes [provider]  prochlorperazine  (COMPAZINE ) 10 MG tablet Take 1 tablet (10 mg total) by mouth every 6 (six) hours as needed (Nausea or vomiting). 12/24/23  Yes Pasam, Avinash, MD  rosuvastatin  (CRESTOR ) 10 MG tablet Take 1 tablet by mouth once daily Patient taking differently: Take 10 mg by mouth in the morning. 02/18/24  Yes Gayle Numbers F, PA-C  dexamethasone  (DECADRON ) 4 MG tablet Take 2 tablets (8 mg) by mouth daily x 3 days starting the day after cisplatin  chemotherapy. Take  with food. Patient not taking: Reported on 05/13/2024 12/24/23   Pasam, Chinita, MD  methylPREDNISolone  (MEDROL  DOSEPAK) 4 MG TBPK tablet Take as directed on package instructions Patient not taking: Reported on 05/13/2024 04/22/24   Pasam, Chinita, MD  ondansetron  (ZOFRAN ) 4 MG tablet Take 1 tablet (4 mg total) by mouth every 6 (six) hours as needed for nausea. Patient not taking: Reported on 05/13/2024 12/16/23   Laurence Locus, DO  spironolactone  (ALDACTONE ) 25 MG tablet TAKE 1 TABLET BY MOUTH ONCE DAILY IN THE MORNING 05/06/24   Ladona Heinz, MD     Family History  Problem Relation Age of Onset   Leukemia Mother    Anesthesia problems Neg Hx    Hypotension Neg Hx    Malignant hyperthermia Neg Hx    Pseudochol deficiency Neg Hx    Colon cancer Neg Hx    Colon polyps Neg Hx    Esophageal cancer Neg Hx    Stomach cancer Neg Hx    Rectal cancer Neg Hx     Social History   Socioeconomic History   Marital status: Married    Spouse name: david   Number of children: 3   Years of education: Not on file   Highest education level: GED or equivalent  Occupational History   Occupation: retired  Tobacco Use   Smoking status: Former    Current packs/day: 0.00    Average packs/day: 1 pack/day for 50.0 years (50.0 ttl pk-yrs)    Types: Cigarettes    Start date: 08/16/1961    Quit date: 08/17/2011    Years since quitting: 12.7    Passive exposure: Never   Smokeless tobacco: Never  Vaping Use   Vaping status: Never Used  Substance and Sexual Activity   Alcohol use: Not Currently    Comment: occasional wine   Drug use: No   Sexual activity: Yes    Birth control/protection: Surgical, Other-see comments  Other Topics Concern   Not on file  Social History Narrative   ** Merged History Encounter **       Social Drivers of Health   Financial Resource Strain: Low Risk  (08/25/2023)   Overall Financial Resource Strain (CARDIA)    Difficulty of Paying Living Expenses: Not hard at all  Food  Insecurity: No Food Insecurity (05/11/2024)   Hunger Vital Sign    Worried About Running Out of Food in the Last Year: Never true  Ran Out of Food in the Last Year: Never true  Transportation Needs: No Transportation Needs (05/11/2024)   PRAPARE - Administrator, Civil Service (Medical): No    Lack of Transportation (Non-Medical): No  Physical Activity: Inactive (08/25/2023)   Exercise Vital Sign    Days of Exercise per Week: 0 days    Minutes of Exercise per Session: 0 min  Stress: No Stress Concern Present (08/25/2023)   [employer] of Occupational Health - Occupational Stress Questionnaire    Feeling of Stress : Not at all  Social Connections: Socially Integrated (05/11/2024)   Social Connection and Isolation Panel    Frequency of Communication with Friends and Family: More than three times a week    Frequency of Social Gatherings with Friends and Family: Once a week    Attends Religious Services: More than 4 times per year    Active Member of [employer] or Organizations: Yes    Attends Engineer, Structural: More than 4 times per year    Marital Status: Married     Review of Systems: A 12 point ROS discussed and pertinent positives are indicated in the HPI above.  All other systems are negative.  Vital Signs: BP (!) 147/77 (BP Location: Left Arm)   Pulse 90   Temp 98.6 F (37 C) (Oral)   Resp 14   Ht 5' 2 (1.575 m)   Wt 159 lb (72.1 kg)   SpO2 98%   BMI 29.08 kg/m    Physical Exam Vitals reviewed.  Constitutional:      General: She is not in acute distress.    Comments: Frail but cheerful, pleasant lady.    HENT:     Head: Normocephalic and atraumatic.     Mouth/Throat:     Mouth: Mucous membranes are moist.     Pharynx: Oropharynx is clear.  Pulmonary:     Effort: Pulmonary effort is normal.     Breath sounds: Normal breath sounds.  Abdominal:     General: Abdomen is flat. Bowel sounds are normal.     Palpations: Abdomen is soft.   Skin:    General: Skin is warm and dry.     Coloration: Skin is not cyanotic.     Comments: No noticeable jaundice   Neurological:     Mental Status: She is alert and oriented to person, place, and time.  Psychiatric:        Mood and Affect: Mood normal.        Behavior: Behavior normal.     MD Evaluation Airway: WNL Heart: WNL Abdomen: WNL Chest/ Lungs: WNL ASA  Classification: 3 Mallampati/Airway Score: Two  Imaging:   Labs:  CBC: Recent Labs    05/11/24 1445 05/12/24 0442 05/13/24 1309 05/14/24 0420  WBC 6.3 6.8 5.6 7.8  HGB 9.3* 8.7* 8.2* 8.5*  HCT 27.8* 27.0* 25.7* 26.7*  PLT 125* 136* 105* 110*    COAGS: Recent Labs    12/14/23 1825 05/14/24 0420  INR 1.1 1.2    BMP: Recent Labs    05/11/24 1445 05/12/24 0442 05/13/24 1309 05/14/24 0420  NA 141 141 139 138  K 3.3* 3.3* 3.4* 3.9  CL 106 106 105 105  CO2 23 21* 22 23  GLUCOSE 95 100* 158* 108*  BUN 16 13 10 11   CALCIUM  8.6* 8.4* 8.4* 8.3*  CREATININE 1.36* 0.88 0.91 0.81  GFRNONAA 40* >60 >60 >60    LIVER FUNCTION TESTS: Recent Labs  05/11/24 1445 05/12/24 0442 05/13/24 1309 05/14/24 0420  BILITOT 2.8* 3.9* 5.7* 6.8*  AST 116* 105* 89* 79*  ALT 128* 123* 111* 103*  ALKPHOS 175* 174* 185* 191*  PROT 5.6* 5.3* 5.2* 5.2*  ALBUMIN  3.4* 3.2* 3.2* 3.1*    TUMOR MARKERS: Recent Labs    03/31/24 1108  CEA 159.82*    Assessment and Plan: 78 y.o. 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   NPO since MN VSS Hg 8.5, plt 110, INR 1.2 today  LD Lovenox  on 10/30  Abc 2g cefoxitin to be give in IR, ordered   Risks and benefits of biliary drain placement discussed with the patient including, but not limited to bleeding, infection which may lead to sepsis even death and damage to adjacent structures.  This  interventional procedure involves the use of X-rays and because of the nature of the planned procedure, it is possible that we will have prolonged use of X-ray fluoroscopy.  Potential radiation risks to you include (but are not limited to) the following: - A slightly elevated risk for cancer  several years later in life. This risk is typically less than 0.5% percent. This risk is low in comparison to the normal incidence of human cancer, which is 33% for women and 50% for men according to the American Cancer Society. - Radiation induced injury can include skin redness, resembling a rash, tissue breakdown / ulcers and hair loss (which can be temporary or permanent).   The likelihood of either of these occurring depends on the difficulty of the procedure and whether you are sensitive to radiation due to previous procedures, disease, or genetic conditions.   IF your procedure requires a prolonged use of radiation, you will be notified and given written instructions for further action.  It is your responsibility to monitor the irradiated area for the 2 weeks following the procedure and to notify your physician if you are concerned that you have suffered a radiation induced injury.    All of the patient's questions were answered, patient is agreeable to proceed.  Consent signed and in IR.  Plan to proceed today.  Keep patient NPO until procedure is performed. Lovenox  can be resumed tonight.  DNR to be maintained during the procedure, IR notified.      Thank you for this interesting consult.  I greatly enjoyed meeting Janice Pace and look forward to participating in their care.  A copy of this report was sent to the requesting provider on this date.  Electronically Signed: Toya VEAR Cousin, PA-C 05/14/2024, 11:02 AM   I spent a total of 40 Minutes    in face to face in clinical consultation, greater than 50% of which was counseling/coordinating care for bili drain placement.  This chart was  dictated using voice recognition software.  Despite best efforts to proofread,  errors can occur which can change the documentation meaning.

## 2024-05-14 NOTE — Plan of Care (Signed)
  Problem: Education: Goal: Knowledge of General Education information will improve Description: Including pain rating scale, medication(s)/side effects and non-pharmacologic comfort measures Outcome: Progressing   Problem: Health Behavior/Discharge Planning: Goal: Ability to manage health-related needs will improve Outcome: Progressing   Problem: Clinical Measurements: Goal: Will remain free from infection Outcome: Progressing Goal: Respiratory complications will improve Outcome: Progressing Goal: Cardiovascular complication will be avoided Outcome: Progressing   Problem: Activity: Goal: Risk for activity intolerance will decrease Outcome: Progressing   Problem: Nutrition: Goal: Adequate nutrition will be maintained Outcome: Progressing   Problem: Coping: Goal: Level of anxiety will decrease Outcome: Progressing   Problem: Pain Managment: Goal: General experience of comfort will improve and/or be controlled Outcome: Progressing

## 2024-05-15 DIAGNOSIS — R7989 Other specified abnormal findings of blood chemistry: Secondary | ICD-10-CM | POA: Diagnosis not present

## 2024-05-15 LAB — CBC
HCT: 26.7 % — ABNORMAL LOW (ref 36.0–46.0)
Hemoglobin: 8.5 g/dL — ABNORMAL LOW (ref 12.0–15.0)
MCH: 32.2 pg (ref 26.0–34.0)
MCHC: 31.8 g/dL (ref 30.0–36.0)
MCV: 101.1 fL — ABNORMAL HIGH (ref 80.0–100.0)
Platelets: 125 K/uL — ABNORMAL LOW (ref 150–400)
RBC: 2.64 MIL/uL — ABNORMAL LOW (ref 3.87–5.11)
RDW: 22.3 % — ABNORMAL HIGH (ref 11.5–15.5)
WBC: 7.7 K/uL (ref 4.0–10.5)
nRBC: 0 % (ref 0.0–0.2)

## 2024-05-15 LAB — COMPREHENSIVE METABOLIC PANEL WITH GFR
ALT: 114 U/L — ABNORMAL HIGH (ref 0–44)
AST: 102 U/L — ABNORMAL HIGH (ref 15–41)
Albumin: 3.1 g/dL — ABNORMAL LOW (ref 3.5–5.0)
Alkaline Phosphatase: 217 U/L — ABNORMAL HIGH (ref 38–126)
Anion gap: 10 (ref 5–15)
BUN: 10 mg/dL (ref 8–23)
CO2: 22 mmol/L (ref 22–32)
Calcium: 8.5 mg/dL — ABNORMAL LOW (ref 8.9–10.3)
Chloride: 105 mmol/L (ref 98–111)
Creatinine, Ser: 0.82 mg/dL (ref 0.44–1.00)
GFR, Estimated: >60 mL/min (ref >60)
Glucose, Bld: 123 mg/dL — ABNORMAL HIGH (ref 70–99)
Potassium: 3.6 mmol/L (ref 3.5–5.1)
Sodium: 136 mmol/L (ref 135–145)
Total Bilirubin: 8.8 mg/dL — ABNORMAL HIGH (ref 0.0–1.2)
Total Protein: 5.3 g/dL — ABNORMAL LOW (ref 6.5–8.1)

## 2024-05-15 MED ORDER — AMLODIPINE BESYLATE 5 MG PO TABS
5.0000 mg | ORAL_TABLET | Freq: Every day | ORAL | Status: DC
  Administered 2024-05-15 – 2024-05-21 (×5): 5 mg via ORAL
  Filled 2024-05-15 (×6): qty 1

## 2024-05-15 MED ORDER — LACTATED RINGERS IV SOLN
INTRAVENOUS | Status: DC
  Administered 2024-05-16: 75 mL/h via INTRAVENOUS

## 2024-05-15 NOTE — Progress Notes (Signed)
 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  CT abdomen and pelvis was negative for obstruction but showed 2 new lesion in the hepatic lobe.  Presented with transaminases.   Assessment & Plan:   Active Problems:   Carcinoma of gall bladder (HCC)   Abdominal pain   LFT elevation   Abnormal CT of the abdomen  1-Abdominal Pain ; Acute on chronic In the setting of metastatic stage IV gallbladder cancer Concern for urine retention and Constipation -Patient presented with worsening abdominal pain, inability to urinate or have a bowel movement since Sunday. -CT abdomen and pelvis: No evidence of bowel obstruction.  Hepatic metastatic disease with suspected new hepatic lesion in both hepatic lobes.  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  Stable scattered omental implants near the liver and near the transverse colon.  Stable cystic and solid mass in the central pelvis measuring 4.8 cm -Continue with MS Contin ,  oxycodone  as needed and IV morphine  PRN. - Oncology consulted and following.  - Continue with Robaxin  PRN  Report pain is controlled with current pain regimen.  IR consulted for Biliary drain placement. Fail attempt on 11/01 Plan for re attempt biliary placement by IR Tuesday or Wednesday.   Transaminases, Hyperbilirubinemia: In the setting of hepatic metastatic disease, CT unable to evaluate bile ducts. -US  showed cirrhosis, gallbladder wall not visualized -Follow oncology recommendation -Per radiology report could  be decrease size of portal vein, MRCP ordered.  -MRCP: Worsening liver metastasis diseases, concern for left portal vein thrombosis ?, hepatic biliary tree obstruction. GI has been consulted, not candidate for ERCP stent placement obstruction is too high in the liver. IR consulted for external drain placement.  -Await oncology recommendation for ? Portal vein thrombosis.    -Hypertension: Continue to hold lisinopril in the setting of AKI.  As needed hydralazine  ordered  Will ad Norvasc.   AKI:  Concern for urinary retention In the setting of poor oral intake and possible urine retention Improved with IV fluids NSL  Hypokalemia: Replaced COPD: Continue Breztri  inhaler twice daily as needed nebulizer   Constipation: Continue MiraLAX , Senokot. Had BM 10/30/ added lactulose.  Had large BM today  Oral thrush: Awaiting med rec to resume nystatin Dermatitis the setting of recent immuno chemotherapy.  Improving      Estimated body mass index is 29.08 kg/m as calculated from the following:   Height as of this encounter: 5' 2 (1.575 m).   Weight as of this encounter: 72.1 kg.   DVT prophylaxis: Lovenox  Code Status: DNR Family Communication: Husband who was at bedside and daughter  Disposition Plan:  Status is: Observation The patient will require care spanning > 2 midnights and should be moved to inpatient because: Agement of pain    Consultants:  Oncology   Procedures:  US   Antimicrobials:    Subjective: She is having abdominal pain right side, got pain meds recently Had large BM , feels better from constipation.  Objective: Vitals:   05/14/24 2031 05/14/24 2125 05/15/24 0430 05/15/24 1349  BP: (!) 153/84  (!) 146/63 (!) 170/70  Pulse: 87  93 88  Resp: 16  16   Temp: 98.2 F (36.8 C)  98.2 F (36.8 C) 98.6 F (37 C)  TempSrc: Oral  Oral Oral  SpO2: 99% 97% 98% 98%  Weight:      Height:        Intake/Output Summary (Last 24 hours) at 05/15/2024 1442 Last  data filed at 05/15/2024 0400 Gross per 24 hour  Intake --  Output 1100 ml  Net -1100 ml   Filed Weights   05/11/24 2101  Weight: 72.1 kg    Examination:  General exam: NAD, Icteric.  Respiratory system: CTA Cardiovascular system: S 1, S 2 RRR Gastrointestinal system: BS present, soft nt Central nervous system: alert Extremities: no edema   Data Reviewed: I have personally reviewed following labs and imaging studies  CBC: Recent Labs  Lab 05/11/24 1445 05/12/24 0442 05/13/24 1309 05/14/24 0420 05/15/24 0441  WBC 6.3 6.8 5.6 7.8 7.7  NEUTROABS 3.7  --   --   --   --   HGB 9.3* 8.7* 8.2* 8.5* 8.5*  HCT 27.8* 27.0* 25.7* 26.7* 26.7*  MCV 98.2 98.2 99.6 100.8* 101.1*  PLT 125* 136* 105* 110* 125*   Basic Metabolic Panel: Recent Labs  Lab 05/11/24 1445 05/12/24 0442 05/13/24 1309 05/14/24 0420 05/15/24 0441  NA 141 141 139 138 136  K 3.3* 3.3* 3.4* 3.9 3.6  CL 106 106 105 105 105  CO2 23 21* 22 23 22   GLUCOSE 95 100* 158* 108* 123*  BUN 16 13 10 11 10   CREATININE 1.36* 0.88 0.91 0.81 0.82  CALCIUM  8.6* 8.4* 8.4* 8.3* 8.5*   GFR: Estimated Creatinine Clearance: 53.4 mL/min (by C-G formula based on SCr of 0.82 mg/dL). Liver Function Tests: Recent Labs  Lab 05/11/24 1445 05/12/24 0442 05/13/24 1309 05/14/24 0420 05/15/24 0441  AST 116* 105* 89* 79* 102*  ALT 128* 123* 111* 103* 114*  ALKPHOS 175* 174* 185* 191* 217*  BILITOT 2.8* 3.9* 5.7* 6.8* 8.8*  PROT 5.6* 5.3* 5.2* 5.2* 5.3*  ALBUMIN  3.4* 3.2* 3.2* 3.1* 3.1*   Recent Labs  Lab 05/11/24 1445  LIPASE 24   No results for input(s): AMMONIA in the last 168 hours. Coagulation Profile: Recent Labs  Lab 05/14/24 0420  INR 1.2   Cardiac Enzymes: No results for input(s): CKTOTAL, CKMB, CKMBINDEX, TROPONINI in the last 168 hours. BNP (last 3 results) No results for input(s): PROBNP in the last 8760 hours. HbA1C: No results for input(s): HGBA1C in the last 72 hours. CBG: No  results for input(s): GLUCAP in the last 168 hours. Lipid Profile: No results for input(s): CHOL, HDL, LDLCALC, TRIG, CHOLHDL, LDLDIRECT in the last 72 hours. Thyroid  Function Tests: No results for input(s): TSH, T4TOTAL, FREET4, T3FREE, THYROIDAB in the last 72 hours. Anemia Panel: No results for input(s): VITAMINB12, FOLATE, FERRITIN, TIBC, IRON, RETICCTPCT in the last 72 hours. Sepsis Labs: No results for input(s): PROCALCITON, LATICACIDVEN in the last 168 hours.  No results found for this or any previous visit (from the past 240 hours).       Radiology Studies: US  Abdomen Limited RUQ (LIVER/GB) Result Date: 05/11/2024 EXAM: Right Upper Quadrant Abdominal Ultrasound 05/11/2024 06:56:14 PM CLINICAL HISTORY: Abnormal transaminases. TECHNIQUE: Real-time ultrasonography of the right upper quadrant of the abdomen was performed. COMPARISON: US  Abdomen Limited 12/14/2023. CT abdomen pelvis 08/24/2023. FINDINGS: LIVER: Nodular hepatic contour. Coarsened echogenicity of the hepatic parenchyma. No intrahepatic biliary ductal dilatation. No focal liver lesions identified. BILIARY SYSTEM: Gallbladder wall not visualized.  The common bile duct measures 6.0 mm. OTHER: No right upper quadrant ascites. IMPRESSION: 1. Cirrhosis. No focal liver lesions identified. Liver protocol contrast-enhanced MR or CT are most sensitive for HCC screening in cirrhosis. 2. Gallbladder wall not visualized. Electronically signed by: Morgane Naveau MD 05/11/2024 07:06 PM EDT RP Workstation: [id number]   CT ABDOMEN PELVIS W CONTRAST Addendum Date: 05/11/2024 ADDENDUM #1ADDENDUM: -- Clinicians worried about biliary obstruction. There is mild central intrahepatic biliary dilatation, slightly increased when compared to the prior CT scan from 12/14/2023. The gallbladder is surgically absent. The cystic and solid appearing process in the gallbladder fossa is likely tumor and is grossly  unchanged. The necrotic-appearing periportal nodal disease maybe slightly larger. It measures approximately 3.0x2.8 cm and previously measured 2.9x2.7 cm. There may be slight narrowing of the adjacent main portal vein and there also appears to be significant new stenosis of the left portal vein. MRI/MRCP may be helpful for further evaluation. -------------------------------------------------- Electronically signed by: Maude Stammer MD 05/11/2024 07:07 PM EDT RP Workstation: [id number]   Result Date: 05/11/2024 ORIGINAL REPORTEXAM: CT ABDOMEN AND PELVIS WITH CONTRAST 05/11/2024 03:50:05 PM TECHNIQUE: CT of the abdomen and pelvis was performed with the administration of 80 mL of iohexol  (OMNIPAQUE ) 300 MG/ML solution. Multiplanar reformatted images are provided for review. Automated exposure control, iterative reconstruction, and/or weight-based adjustment of the mA/kV was utilized to reduce the radiation dose to as low as reasonably achievable. COMPARISON: PET CT 03/21/2024 and CT abdomen/pelvis 12/14/2023. CLINICAL HISTORY: Bowel obstruction suspected; LLQ abdominal pain. FINDINGS: LOWER CHEST: Persistent/stable pulmonary metastatic disease. No pleural or pericardial effusion. Stable vascular calcifications. LIVER: Hepatic metastatic disease. Suspect new hepatic lesions in both hepatic lobes. 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 Persistent mild intrahepatic biliary dilatation. Stable necrotic-appearing periportal adenopathy. GALLBLADDER AND BILE DUCTS: Surgically absent. No biliary ductal dilatation. SPLEEN: No splenic lesions. PANCREAS: The pancreas is unremarkable and stable. ADRENAL GLANDS: Bilateral adrenal gland nodules are stable. KIDNEYS, URETERS AND BLADDER: No renal lesions. The bladder is normal. No stones in the kidneys or ureters. No hydronephrosis. No perinephric or periureteral stranding. GI AND BOWEL: The stomach, duodenum, small bowel, and colon  are unremarkable. No findings suspicious for bowel obstruction. Moderate stool burden. PERITONEUM AND RETROPERITONEUM: No free abdominal or free pelvic fluid. No mesenteric or retroperitoneal mass. Overall stable scattered omental implants near the liver and near the transverse colon. VASCULATURE: Stable advanced atherosclerotic calcifications involving the aorta and branch vessels but no aneurysm or dissection. The major venous structures are patent. LYMPH NODES: No lymphadenopathy. REPRODUCTIVE ORGANS: BONES AND SOFT TISSUES: The bony structures are intact. Extensive surgical changes involving the THORACIC and LUMBAR spines without obvious complicating features. Diffuse osteoporosis. No focal soft tissue abnormality. No inguinal mass or adenopathy. IMPRESSION: 1. No CT evidence of bowel obstruction. 2. Hepatic metastatic disease with suspected new hepatic lesions in both hepatic lobes. 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 3. Stable scattered omental implants near the liver and near the transverse colon. 4. Stable cystic and solid mass in the central pelvis measuring 4.8 cm. Electronically signed by: Maude Stammer MD 05/11/2024 04:38 PM EDT RP Workstation: [id number]        Scheduled Meds:  budesonide -glycopyrrolate -formoterol   2 puff Inhalation BID   Chlorhexidine  Gluconate Cloth  6 each Topical Daily   docusate sodium   100 mg Oral BID   feeding supplement  237 mL Oral BID BM   lactulose  20 g Oral  BID   morphine   15 mg Oral Q12H   polyethylene glycol  17 g Oral BID   senna  1 tablet Oral BID   sodium phosphate   1 enema Rectal Once   Continuous Infusions:  lactated ringers  75 mL/hr (05/15/24 0330)      LOS: 3 days    Time spent: 35 minutes    Ziona Wickens A Tyreese Thain, MD [facility]   If 7PM-7AM, please contact night-coverage [email or web address]  05/15/2024, 2:42 PM

## 2024-05-15 NOTE — Plan of Care (Signed)
  Problem: Clinical Measurements: Goal: Will remain free from infection Outcome: Progressing Goal: Respiratory complications will improve Outcome: Progressing Goal: Cardiovascular complication will be avoided Outcome: Progressing   Problem: Education: Goal: Knowledge of General Education information will improve Description: Including pain rating scale, medication(s)/side effects and non-pharmacologic comfort measures Outcome: Not Progressing   Problem: Health Behavior/Discharge Planning: Goal: Ability to manage health-related needs will improve Outcome: Not Progressing   Problem: Clinical Measurements: Goal: Ability to maintain clinical measurements within normal limits will improve Outcome: Not Progressing Goal: Diagnostic test results will improve Outcome: Not Progressing   Problem: Activity: Goal: Risk for activity intolerance will decrease Outcome: Not Progressing   Problem: Nutrition: Goal: Adequate nutrition will be maintained Outcome: Not Progressing   Problem: Coping: Goal: Level of anxiety will decrease Outcome: Not Progressing

## 2024-05-15 NOTE — Progress Notes (Signed)
 IR aware of the worsening hyperbilirubinemia.   Dr. Philip informed, plan to re-attempt the bili drain placement around Tue/Wed.  Order for the procedure has been place, IR APP will make the patient NPO once the date of the second attempt is determined.  IR will follow.    Janice Kidd H Toyna Erisman PA-C 05/15/2024 2:18 PM

## 2024-05-16 ENCOUNTER — Other Ambulatory Visit: Payer: Self-pay

## 2024-05-16 DIAGNOSIS — R109 Unspecified abdominal pain: Secondary | ICD-10-CM | POA: Diagnosis not present

## 2024-05-16 DIAGNOSIS — C23 Malignant neoplasm of gallbladder: Secondary | ICD-10-CM | POA: Diagnosis not present

## 2024-05-16 DIAGNOSIS — R7989 Other specified abnormal findings of blood chemistry: Secondary | ICD-10-CM | POA: Diagnosis not present

## 2024-05-16 DIAGNOSIS — E782 Mixed hyperlipidemia: Secondary | ICD-10-CM

## 2024-05-16 LAB — COMPREHENSIVE METABOLIC PANEL WITH GFR
ALT: 88 U/L — ABNORMAL HIGH (ref 0–44)
AST: 75 U/L — ABNORMAL HIGH (ref 15–41)
Albumin: 2.9 g/dL — ABNORMAL LOW (ref 3.5–5.0)
Alkaline Phosphatase: 206 U/L — ABNORMAL HIGH (ref 38–126)
Anion gap: 10 (ref 5–15)
BUN: 9 mg/dL (ref 8–23)
CO2: 23 mmol/L (ref 22–32)
Calcium: 8.5 mg/dL — ABNORMAL LOW (ref 8.9–10.3)
Chloride: 105 mmol/L (ref 98–111)
Creatinine, Ser: 0.73 mg/dL (ref 0.44–1.00)
GFR, Estimated: >60 mL/min (ref >60)
Glucose, Bld: 111 mg/dL — ABNORMAL HIGH (ref 70–99)
Potassium: 4 mmol/L (ref 3.5–5.1)
Sodium: 137 mmol/L (ref 135–145)
Total Bilirubin: 6.7 mg/dL — ABNORMAL HIGH (ref 0.0–1.2)
Total Protein: 5.1 g/dL — ABNORMAL LOW (ref 6.5–8.1)

## 2024-05-16 LAB — CBC
HCT: 24 % — ABNORMAL LOW (ref 36.0–46.0)
Hemoglobin: 7.7 g/dL — ABNORMAL LOW (ref 12.0–15.0)
MCH: 32.4 pg (ref 26.0–34.0)
MCHC: 32.1 g/dL (ref 30.0–36.0)
MCV: 100.8 fL — ABNORMAL HIGH (ref 80.0–100.0)
Platelets: 108 K/uL — ABNORMAL LOW (ref 150–400)
RBC: 2.38 MIL/uL — ABNORMAL LOW (ref 3.87–5.11)
RDW: 22.5 % — ABNORMAL HIGH (ref 11.5–15.5)
WBC: 6.8 K/uL (ref 4.0–10.5)
nRBC: 0 % (ref 0.0–0.2)

## 2024-05-16 NOTE — Progress Notes (Signed)
 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  CT abdomen and pelvis was negative for obstruction but showed 2 new lesion in the hepatic lobe.  Presented with transaminases.   Assessment & Plan:   Active Problems:   Carcinoma of gall bladder (HCC)   Abdominal pain   LFT elevation   Abnormal CT of the abdomen  1-Abdominal Pain ; Acute on chronic In the setting of metastatic stage IV gallbladder cancer Concern for urine retention and Constipation -Patient presented with worsening abdominal pain, inability to urinate or have a bowel movement since Sunday. -CT abdomen and pelvis: No evidence of bowel obstruction.  Hepatic metastatic disease with suspected new hepatic lesion in both hepatic lobes.  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  Stable scattered omental implants near the liver and near the transverse colon.  Stable cystic and solid mass in the central pelvis measuring 4.8 cm -Continue with MS Contin ,  oxycodone  as needed and IV morphine  PRN. - Oncology consulted and following.  - Continue with Robaxin  PRN  Report pain is controlled with current pain regimen.  IR consulted for Biliary drain placement. Fail attempt on 11/01 Plan for re attempt biliary placement by IR Tuesday or Wednesday.   Transaminases, Hyperbilirubinemia: In the setting of hepatic metastatic disease, CT unable to evaluate bile ducts. -US  showed cirrhosis, gallbladder wall not visualized -Follow oncology recommendation -Per radiology report could  be decrease size of portal vein, MRCP ordered.  -MRCP: Worsening liver metastasis diseases, concern for left portal vein thrombosis ?, hepatic biliary tree obstruction. GI has been consulted, not candidate for ERCP stent placement obstruction is too high in the liver. IR consulted for external drain placement.  -Await oncology recommendation for ? Portal vein thrombosis.   Anemia; of chronic diseases, malignancy  Monitor.    -Hypertension: Continue to hold lisinopril in the setting of AKI.  As needed hydralazine  ordered Added  Norvasc.   AKI:  Concern for urinary retention In the setting of poor oral intake and possible urine retention Improved with IV fluids NSL  Hypokalemia: Replaced COPD: Continue Breztri  inhaler twice daily as needed nebulizer   Constipation: Continue MiraLAX , Senokot. Had BM 10/30/ added lactulose.  Had large BM today  Oral thrush: Completed diflucan .  Dermatitis the setting of recent immuno chemotherapy.  Improving      Estimated body mass index is 29.08 kg/m as calculated from the following:   Height as of this encounter: 5' 2 (1.575 m).   Weight as of this encounter: 72.1 kg.   DVT prophylaxis: Lovenox  Code Status: DNR Family Communication: Husband who was at bedside and daughter  Disposition Plan:  Status is: Observation The patient will require care spanning > 2 midnights and should be moved to inpatient because: Agement of pain    Consultants:  Oncology   Procedures:  US   Antimicrobials:    Subjective: She has been moving her BM.  Still having abdominal pain. Warm compress helps  Objective: Vitals:   05/15/24 2231 05/16/24 0548 05/16/24 0755 05/16/24 1425  BP: (!) 136/50 128/68  (!) 143/69  Pulse:  89  84  Resp:  16  16  Temp:  97.8 F (36.6 C)  98.1 F (36.7 C)  TempSrc:  Oral  Oral  SpO2:  97% 98% 98%  Weight:      Height:        Intake/Output Summary (Last 24 hours) at 05/16/2024 1515 Last data filed at 05/16/2024  0546 Gross per 24 hour  Intake 838.43 ml  Output 1200 ml  Net -361.57 ml   Filed Weights   05/11/24 2101  Weight: 72.1 kg    Examination:  General exam: NAD, Icteric Respiratory system: CTA Cardiovascular system: S 1, S 2 RRR Gastrointestinal system: BS present, soft, nt Central nervous system: Alert Extremities: no edema   Data Reviewed: I have personally reviewed following labs and imaging studies  CBC: Recent Labs  Lab 05/11/24 1445 05/12/24 0442 05/13/24 1309 05/14/24 0420 05/15/24 0441 05/16/24 0505  WBC 6.3 6.8 5.6 7.8 7.7 6.8  NEUTROABS 3.7  --   --   --   --   --   HGB 9.3* 8.7* 8.2* 8.5* 8.5* 7.7*  HCT 27.8* 27.0* 25.7* 26.7* 26.7* 24.0*  MCV 98.2 98.2 99.6 100.8* 101.1* 100.8*  PLT 125* 136* 105* 110* 125* 108*   Basic Metabolic Panel: Recent Labs  Lab 05/12/24 0442 05/13/24 1309 05/14/24 0420 05/15/24 0441 05/16/24 0505  NA 141 139 138 136 137  K 3.3* 3.4* 3.9 3.6 4.0  CL 106 105 105 105 105  CO2 21* 22 23 22 23   GLUCOSE 100* 158* 108* 123* 111*  BUN 13 10 11 10 9   CREATININE 0.88 0.91 0.81 0.82 0.73  CALCIUM  8.4* 8.4* 8.3* 8.5* 8.5*   GFR: Estimated Creatinine Clearance: 54.8 mL/min (by C-G formula based on SCr of 0.73 mg/dL). Liver Function Tests: Recent Labs  Lab 05/12/24 0442 05/13/24 1309 05/14/24 0420 05/15/24 0441 05/16/24 0505  AST 105* 89* 79* 102* 75*  ALT 123* 111* 103* 114* 88*  ALKPHOS 174* 185* 191* 217* 206*  BILITOT 3.9* 5.7* 6.8* 8.8* 6.7*  PROT 5.3* 5.2* 5.2* 5.3* 5.1*  ALBUMIN  3.2* 3.2* 3.1* 3.1* 2.9*   Recent Labs  Lab 05/11/24 1445  LIPASE 24   No results for input(s): AMMONIA in the last 168 hours. Coagulation Profile: Recent Labs  Lab 05/14/24 0420  INR 1.2   Cardiac Enzymes: No results for input(s): CKTOTAL, CKMB, CKMBINDEX, TROPONINI in the last 168 hours. BNP (last 3 results) No results for input(s): PROBNP in the last 8760 hours. HbA1C: No results for input(s): HGBA1C in the  last 72 hours. CBG: No results for input(s): GLUCAP in the last 168 hours. Lipid Profile: No results for input(s): CHOL, HDL, LDLCALC, TRIG, CHOLHDL, LDLDIRECT in the last 72 hours. Thyroid  Function Tests: No results for input(s): TSH, T4TOTAL, FREET4, T3FREE, THYROIDAB in the last 72 hours. Anemia Panel: No results for input(s): VITAMINB12, FOLATE, FERRITIN, TIBC, IRON, RETICCTPCT in the last 72 hours. Sepsis Labs: No results for input(s): PROCALCITON, LATICACIDVEN in the last 168 hours.  No results found for this or any previous visit (from the past 240 hours).       Radiology Studies: US  Abdomen Limited RUQ (LIVER/GB) Result Date: 05/11/2024 EXAM: Right Upper Quadrant Abdominal Ultrasound 05/11/2024 06:56:14 PM CLINICAL HISTORY: Abnormal transaminases. TECHNIQUE: Real-time ultrasonography of the right upper quadrant of the abdomen was performed. COMPARISON: US  Abdomen Limited 12/14/2023. CT abdomen pelvis 08/24/2023. FINDINGS: LIVER: Nodular hepatic contour. Coarsened echogenicity of the hepatic parenchyma. No intrahepatic biliary ductal dilatation. No focal liver lesions identified.  BILIARY SYSTEM: Gallbladder wall not visualized. The common bile duct measures 6.0 mm. OTHER: No right upper quadrant ascites. IMPRESSION: 1. Cirrhosis. No focal liver lesions identified. Liver protocol contrast-enhanced MR or CT are most sensitive for HCC screening in cirrhosis. 2. Gallbladder wall not visualized. Electronically signed by: Morgane Naveau MD 05/11/2024 07:06 PM EDT RP Workstation: [id number]   CT ABDOMEN PELVIS W CONTRAST Addendum Date: 05/11/2024 ADDENDUM #1ADDENDUM: -- Clinicians worried about biliary obstruction. There is mild central intrahepatic biliary dilatation, slightly increased when compared to the prior CT scan from 12/14/2023. The gallbladder is surgically absent. The cystic and solid appearing process in the gallbladder fossa is likely  tumor and is grossly unchanged. The necrotic-appearing periportal nodal disease maybe slightly larger. It measures approximately 3.0x2.8 cm and previously measured 2.9x2.7 cm. There may be slight narrowing of the adjacent main portal vein and there also appears to be significant new stenosis of the left portal vein. MRI/MRCP may be helpful for further evaluation. -------------------------------------------------- Electronically signed by: Maude Stammer MD 05/11/2024 07:07 PM EDT RP Workstation: [id number]   Result Date: 05/11/2024 ORIGINAL REPORTEXAM: CT ABDOMEN AND PELVIS WITH CONTRAST 05/11/2024 03:50:05 PM TECHNIQUE: CT of the abdomen and pelvis was performed with the administration of 80 mL of iohexol  (OMNIPAQUE ) 300 MG/ML solution. Multiplanar reformatted images are provided for review. Automated exposure control, iterative reconstruction, and/or weight-based adjustment of the mA/kV was utilized to reduce the radiation dose to as low as reasonably achievable. COMPARISON: PET CT 03/21/2024 and CT abdomen/pelvis 12/14/2023. CLINICAL HISTORY: Bowel obstruction suspected; LLQ abdominal pain. FINDINGS: LOWER CHEST: Persistent/stable pulmonary metastatic disease. No pleural or pericardial effusion. Stable vascular calcifications. LIVER: Hepatic metastatic disease. Suspect new hepatic lesions in both hepatic lobes. 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 Persistent mild intrahepatic biliary dilatation. Stable necrotic-appearing periportal adenopathy. GALLBLADDER AND BILE DUCTS: Surgically absent. No biliary ductal dilatation. SPLEEN: No splenic lesions. PANCREAS: The pancreas is unremarkable and stable. ADRENAL GLANDS: Bilateral adrenal gland nodules are stable. KIDNEYS, URETERS AND BLADDER: No renal lesions. The bladder is normal. No stones in the kidneys or ureters. No hydronephrosis. No perinephric or periureteral stranding. GI AND BOWEL: The stomach, duodenum,  small bowel, and colon are unremarkable. No findings suspicious for bowel obstruction. Moderate stool burden. PERITONEUM AND RETROPERITONEUM: No free abdominal or free pelvic fluid. No mesenteric or retroperitoneal mass. Overall stable scattered omental implants near the liver and near the transverse colon. VASCULATURE: Stable advanced atherosclerotic calcifications involving the aorta and branch vessels but no aneurysm or dissection. The major venous structures are patent. LYMPH NODES: No lymphadenopathy. REPRODUCTIVE ORGANS: BONES AND SOFT TISSUES: The bony structures are intact. Extensive surgical changes involving the THORACIC and LUMBAR spines without obvious complicating features. Diffuse osteoporosis. No focal soft tissue abnormality. No inguinal mass or adenopathy. IMPRESSION: 1. No CT evidence of bowel obstruction. 2. Hepatic metastatic disease with suspected new hepatic lesions in both hepatic lobes. 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 3. Stable scattered omental implants near the liver and near the transverse colon. 4. Stable cystic and solid mass in the central pelvis measuring 4.8 cm. Electronically signed by: Maude Stammer MD 05/11/2024 04:38 PM EDT RP Workstation: [id number]        Scheduled Meds:  amLODipine  5 mg Oral Daily   budesonide -glycopyrrolate -formoterol   2 puff Inhalation BID   Chlorhexidine  Gluconate Cloth  6 each Topical Daily   docusate sodium   100 mg Oral BID   feeding  supplement  237 mL Oral BID BM   lactulose  20 g Oral BID   morphine   15 mg Oral Q12H   polyethylene glycol  17 g Oral BID   senna  1 tablet Oral BID   sodium phosphate   1 enema Rectal Once   Continuous Infusions:  lactated ringers  75 mL/hr (05/16/24 0546)      LOS: 4 days    Time spent: 35 minutes    Fox Salminen A Khushboo Chuck, MD [facility]   If 7PM-7AM, please contact night-coverage [email or web address]  05/16/2024, 3:15 PM

## 2024-05-16 NOTE — Progress Notes (Signed)
 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 - The disease burden is causing biliary obstruction and jaundice. - GI has evaluated the patient and because of the location, they cannot help with stenting/intervention.  Recommended IR evaluation for external biliary drain placement. -IR attempted external biliary drain placement on 05/14/2024 but it was not successful.  Plan for reevaluation on 05/17/2024 or 05/18/2024 for possible intervention. - Bilirubin remains elevated, peaked at 8.8 as of 05/15/2024. - Today I discussed MRI findings with the patient and her family members.  Explained unfortunate disease progression.  Discussed need to switch systemic treatments however her bilirubin has to return to close to normal for her to be a candidate for further systemic treatments. - Today I introduced the concept of hospice, if her clinical situation worsens or if she has progressive jaundice, which precludes systemic chemotherapy.  Patient verbalized understanding.  She would like to take 1 day at a time.  Awaiting IR re-evaluation before deciding on further plan of care.   Obstructive jaundice due to malignant biliary obstruction Obstructive jaundice secondary to malignant biliary obstruction from gallbladder cancer, with elevated bilirubin levels limiting chemotherapy options. Interventional radiology is considering another attempt to place a drain to alleviate obstruction and reduce bilirubin levels. - Consulted interventional radiology for potential  external biliary drain placement. - Monitor bilirubin levels and liver function tests.   Will continue to follow.  Please call us  with any questions.   Chinita Patten, MD 05/16/2024 3:14 PM  ONCOLOGY HISTORY:    78 y.o. lady with past medical history of essential hypertension, osteoarthritis, COPD/asthma, GERD, depression, anxiety, hiatal hernia, chronic back pain presented to the ED on 12/14/2023 with complaints of worsening epigastric/abdominal pain over 2-3 weeks.  Routine blood work was unremarkable in the ER.  However CT abdomen and pelvis showed irregular appearance of the gallbladder with suggestion of capsular thickening along the fundus of the gallbladder.  Gallbladder appeared inseparable from the adjacent portion of the liver.  Clinical picture was concerning for gallbladder carcinoma.  Mesenteric nodular thickening along the mid lower abdominal cavity indicating peritoneal carcinomatosis.  Soft tissue nodular mass in the left lower quadrant region of the pelvis measuring 3.6 x 2.7 cm, likely ovarian neoplastic lesion.  Multiple lung nodules in the lower lobes bilaterally, largest measuring 12 mm in the right lower lobe, concerning for pulmonary metastatic disease.   She was admitted to the hospital for further evaluation and management.  IR was consulted for peritoneal mass biopsy.  CA 19-9 was significant elevated at 14,368.  CEA was increased at 98.7, CA-125 was also increased at 91.3.   On 12/14/2023, MRI abdomen/MRCP showed thickened, contracted gallbladder with a heterogeneous mass arising from the gallbladder fundus, contiguous with the adjacent liver parenchyma of hepatic segment IVB measuring 3.5 x 2.8 cm. Multiple small rim enhancing lesions within the liver parenchyma. Enlarged, necrotic appearing portacaval and porta hepatis lymph nodes. Small, although rim enhancing and abnormal appearing retroperitoneal lymph nodes. Peritoneal stranding and nodularity, incompletely visualized on  this examination although present in the left upper quadrant, consistent with peritoneal metastatic  disease. Numerous small bilateral pulmonary nodules, better assessed by CT. 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   On 12/15/2023, she underwent CT-guided biopsy of the omental lesion.  Pathology showed metastatic well to moderately differentiated adenocarcinoma. The tumor is positive for cytokeratin 7 and shows focal weak positivity for the GI marker CDX2.  The tumor is negative for the GI markers cytokeratin 20.  The tumor is also negative for the GU and GYN marker PAX8.  The tumor is negative for the pulmonary adeno marker TTF-1. This immunohistochemical pattern and histomorphology would be compatible with the clinical suspicion of a gallbladder primary.  The differential diagnosis would also include an upper GI or pancreaticobiliary primary.    Patient presented to our clinic on 12/24/2023 to establish care with us .  Request placed for staging PET scan.  NGS panel testing requested on the specimen and also liquid biopsy obtained.  It showed no actionable mutations.   Staging PET scan on 12/29/2023 showed known gallbladder mass extending into the liver.  Peritoneal carcinomatosis noted.  Numerous lung nodules were identified, small bowel with FDG uptake, concerning for metastatic disease in the lungs.  Metastatic lymphadenopathy was noted in the abdomen and retroperitoneum.  This scan will serve as a good baseline for future compression.    Plan for palliative systemic chemoimmunotherapy with cisplatin , gemcitabine , durvalumab . Scheduled to start C1D1 from 01/01/24.    Restaging PET scan on 03/21/2024 showed overall improvement in disease.  She was tolerating treatments well up until early October 2025 when she started having some swallowing difficulty, clinical picture concerning for esophageal candidiasis and a skin rash which led to a break  in systemic treatments   She presented to the ED on 05/11/2024 with complaints of abdominal pain and possible obstipation.  CT abdomen and pelvis on 05/11/2024 showed suspected new hepatic lesions in both hepatic lobes, mild central intrahepatic biliary dilatation.  Necrotic appearing periportal nodal disease may be slightly larger at 3 x 2.8 cm, compared to 2.9 x 2.7 cm previously.  There may be slight narrowing of the adjacent main portal vein and there also appears to be significant new stenosis of the left portal vein.  MRI/MRCP would be helpful for further evaluation.   Bilirubin has been slowly trending upwards.  Previously normal at 0.5 when we saw her in clinic.  It was 3.9 on arrival to ED.  Alkaline phosphatase, AST ALT were also elevated.  On 05/13/2024, MRI/MRCP showed evidence of disease progression with multiple new lesions throughout the liver which are increased in size and number, compatible with worsening metastatic disease.  GI consulted.  They could not do intervention because of the location of biliary obstruction.  On 05/14/2024, IR attempted external biliary drainage but it was not successful.  Plan for IR reevaluation on 05/17/2024 or 05/18/2024.  SUBJECTIVE:   She has a persistent rash that has improved but not completely resolved. There is no associated pain, and her overall pain is under control.  An attempt by interventional radiology to place a drain to alleviate the pressure and reduce bilirubin levels was unsuccessful. Further attempts may be necessary.   OBJECTIVE:  Vitals:   05/16/24 0755 05/16/24 1425  BP:  (!) 143/69  Pulse:  84  Resp:  16  Temp:  98.1 F (36.7 C)  SpO2: 98% 98%     Intake/Output Summary (Last 24 hours) at 05/16/2024 1514 Last data filed at 05/16/2024 0546 Gross per 24 hour  Intake 838.43  ml  Output 1200 ml  Net -361.57 ml    Physical Exam Constitutional:      General: She is not in acute distress.    Appearance: Normal appearance.   HENT:     Head: Normocephalic and atraumatic.  Eyes:     General: Scleral icterus present.  Cardiovascular:     Rate and Rhythm: Normal rate.  Pulmonary:     Effort: Pulmonary effort is normal. No respiratory distress.  Abdominal:     General: There is no distension.  Skin:    Coloration: Skin is jaundiced.     Findings: Rash (scattered maculopapular rash, improved) present.  Neurological:     General: No focal deficit present.     Mental Status: She is alert and oriented to person, place, and time.  Psychiatric:        Mood and Affect: Mood normal.        Behavior: Behavior normal.      LABS:   Results for orders placed or performed during the hospital encounter of 05/11/24 (from the past 24 hours)  CBC     Status: Abnormal   Collection Time: 05/16/24  5:05 AM  Result Value Ref Range   WBC 6.8 4.0 - 10.5 K/uL   RBC 2.38 (L) 3.87 - 5.11 MIL/uL   Hemoglobin 7.7 (L) 12.0 - 15.0 g/dL   HCT 75.9 (L) 63.9 - 53.9 %   MCV 100.8 (H) 80.0 - 100.0 fL   MCH 32.4 26.0 - 34.0 pg   MCHC 32.1 30.0 - 36.0 g/dL   RDW 77.4 (H) 88.4 - 84.4 %   Platelets 108 (L) 150 - 400 K/uL   nRBC 0.0 0.0 - 0.2 %  Comprehensive metabolic panel     Status: Abnormal   Collection Time: 05/16/24  5:05 AM  Result Value Ref Range   Sodium 137 135 - 145 mmol/L   Potassium 4.0 3.5 - 5.1 mmol/L   Chloride 105 98 - 111 mmol/L   CO2 23 22 - 32 mmol/L   Glucose, Bld 111 (H) 70 - 99 mg/dL   BUN 9 8 - 23 mg/dL   Creatinine, Ser 9.26 0.44 - 1.00 mg/dL   Calcium  8.5 (L) 8.9 - 10.3 mg/dL   Total Protein 5.1 (L) 6.5 - 8.1 g/dL   Albumin  2.9 (L) 3.5 - 5.0 g/dL   AST 75 (H) 15 - 41 U/L   ALT 88 (H) 0 - 44 U/L   Alkaline Phosphatase 206 (H) 38 - 126 U/L   Total Bilirubin 6.7 (H) 0.0 - 1.2 mg/dL   GFR, Estimated >39 >39 mL/min   Anion gap 10 5 - 15     IMAGING STUDIES:   IR BILIARY DRAIN PLACEMENT WITH CHOLANGIOGRAM Result Date: 05/14/2024 INDICATION: 78 year old with metastatic gallbladder carcinoma and  found to have progressive disease. Patient has mild biliary dilatation and elevated bilirubin. Plan for percutaneous transhepatic cholangiogram and attempt biliary drain placement. EXAM: 1. Percutaneous transhepatic cholangiogram using ultrasound and fluoroscopic guidance. 2. Attempted placement of percutaneous biliary drain. MEDICATIONS: Cefoxitin 2 g; The antibiotic was administered within an appropriate time frame prior to the initiation of the procedure. ANESTHESIA/SEDATION: Moderate (conscious) sedation was employed during this procedure. A total of Versed  4 mg and Fentanyl  150 mcg was administered intravenously by the radiology nurse. Total intra-service moderate Sedation Time: 70 minutes. The patient's level of consciousness and vital signs were monitored continuously by radiology nursing throughout the procedure under my direct supervision. FLUOROSCOPY: Radiation Exposure Index (as  provided by the fluoroscopic device): 311 mGy Kerma COMPLICATIONS: None immediate. PROCEDURE: Informed written consent was obtained from the patient after a thorough discussion of the procedural risks, benefits and alternatives. All questions were addressed. Maximal Sterile Barrier Technique was utilized including caps, mask, sterile gowns, sterile gloves, sterile drape, hand hygiene and skin antiseptic. A timeout was performed prior to the initiation of the procedure. Right side of the abdomen was prepped and draped in sterile fashion. Ultrasound identified mild intrahepatic biliary dilatation in the right hepatic lobe. Right side of the abdomen was anesthetized with 1% lidocaine . Using ultrasound guidance, 21 gauge needle was directed into a peripheral bile duct. Contrast injection confirmed placement in the biliary system. Multiple attempts were made to cannulate a peripheral bile duct using 0.018 wires. These attempts to cannulate the biliary system with a wire were unsuccessful. Additional bile ducts were targeted using  ultrasound and fluoroscopic guidance but these bile ducts could not be successfully cannulated with a wire. Eventually, the procedure was aborted. Bandage placed at the puncture sites. Fluoroscopic and ultrasound images were taken and saved for documentation. FINDINGS: Ultrasound demonstrated mild intrahepatic biliary dilatation. A peripheral right hepatic bile duct was targeted with ultrasound. A peripheral right hepatic bile duct was successfully opacified with contrast. Mild to moderate dilatation in the central portion of this bile duct. An additional bile duct towards the hepatic dome was also opacified. There is no filling of the left hepatic ducts. Contrast did drain into the common bile duct and duodenum. Lack of contrast at the biliary confluence probably related to obstructing lesions. IMPRESSION: 1. Percutaneous transhepatic cholangiogram demonstrates mild intrahepatic biliary dilatation but incomplete opacification of the biliary system. Findings are suggestive for partial obstructive disease in the central aspect of the biliary system but there is drainage into the extrahepatic bile duct and duodenum. 2. Attempts to place a percutaneous biliary drain were unsuccessful due to the small size of the peripheral bile ducts. Electronically Signed   By: Juliene Balder M.D.   On: 05/14/2024 17:41   MR ABDOMEN MRCP W WO CONTAST Result Date: 05/13/2024 CLINICAL DATA:  Epigastric pain 885157 Epigastric pain 114842; Epigastric pain. EXAM: MRI ABDOMEN WITHOUT AND WITH CONTRAST (INCLUDING MRCP) TECHNIQUE: Multiplanar multisequence MR imaging of the abdomen was performed both before and after the administration of intravenous contrast. Heavily T2-weighted images of the biliary and pancreatic ducts were obtained, and three-dimensional MRCP images were rendered by post processing. CONTRAST:  7mL GADAVIST  GADOBUTROL  1 MMOL/ML IV SOLN COMPARISON:  Ultrasound abdomen and CT scan abdomen and pelvis from 05/11/2024.  FINDINGS: Lower chest: Unremarkable MR appearance to the lung bases. No pleural effusion. No pericardial effusion. Normal heart size. Hepatobiliary: The liver is moderately enlarged in size measuring up to 17.7 cm in length. Noncirrhotic configuration. There are multiple (around 10), T2 hyperintense,, peripherally rim enhancing target like lesions throughout liver, which have increased in size and number since the prior study and compatible with worsening metastatic disease. There is redemonstration of heterogeneous contracted gallbladder with ill-defined heterogeneous peripherally irregular wall enhancing lesion in the fundus of the gallbladder which appears to infiltrate into the left hepatic lobe, segment 4B, suggesting infiltrating tumor. The lesion measures up to 2.9 x 3.1 cm, grossly similar to the prior study., There is mild-to-moderate central intrahepatic bile duct dilation. There is abrupt narrowing/cutoff of the proximal extrahepatic bile duct. The mid/distal extrahepatic bile duct is nondilated. There is ill-defined peripheral irregular wall enhancing mass in the caudate lobe which is likely the cause  of abrupt cutoff of the central biliary tree. Findings may be due to primary hilar cholangiocarcinoma versus metastases. The middle and right intrahepatic veins are patent. Left hepatic vein is not well seen, likely thrombosed. There is moderate narrowing in the midportion of main portal vein (series 19, image 31). Right portal vein and its major tributaries are patent. However, there is nonvisualization of left portal vein, suggesting thrombosis. These findings are new since the prior study from 12/14/2023. Pancreas: No mass, inflammatory changes or other parenchymal abnormality identified. No main pancreatic duct dilation. Spleen: Top-normal spleen measuring upto 5.5 x 12.9 orthogonally on coronal plane. No focal mass. Adrenals/Urinary Tract: Bilateral adrenal adenomas noted measuring 1.1 x 1.5 cm on the  left side and 1.0 x 1.5 cm on the right side. No hydroureteronephrosis. No suspicious renal mass. Stomach/Bowel: Visualized portions within the abdomen are unremarkable. No disproportionate dilation of bowel loops. Vascular/Lymphatic: No pathologically enlarged lymph nodes identified. No abdominal aortic aneurysm demonstrated. No ascites. Redemonstration of multiple nodular implants throughout the abdomen, compatible with peritoneal carcinomatosis. Overall, there is mild interval increase in the size, suggesting worsening peritoneal carcinomatosis. Other: Susceptibility artifact from thoracolumbar spine spinal fixation hardware noted. Musculoskeletal: No suspicious bone lesions identified. IMPRESSION: 1. There is redemonstration of heterogeneous contracted gallbladder with ill-defined heterogeneous peripherally irregular wall enhancing lesion in the fundus of the gallbladder which appears to infiltrate into the left hepatic lobe, segment 4B, suggesting infiltrating tumor. 2. There are multiple (around 10), T2 hyperintense, peripherally rim enhancing target like lesions throughout liver, which have increased in size and number since the prior study and compatible with worsening metastatic disease. 3. There is mild-to-moderate central intrahepatic bile duct dilation. There is abrupt narrowing/cutoff of the proximal extrahepatic bile duct. The mid/distal extrahepatic bile duct is nondilated. There is ill-defined peripheral irregular wall enhancing mass in the caudate lobe which is likely the cause of abrupt cutoff of the central biliary tree. Findings may be due to primary hilar cholangiocarcinoma versus metastases. 4. There is moderate narrowing in the midportion of main portal vein. Right portal vein and its major tributaries are patent. However, there is nonvisualization of left portal vein, suggesting thrombosis. There is also new nonvisualization of left hepatic vein. These findings are new since the prior study  from 12/14/2023. 5. Bilateral adrenal adenomas. Electronically Signed   By: Ree Molt M.D.   On: 05/13/2024 13:26   MR 3D Recon At Scanner Result Date: 05/13/2024 CLINICAL DATA:  Epigastric pain 114842 Epigastric pain 114842; Epigastric pain. EXAM: MRI ABDOMEN WITHOUT AND WITH CONTRAST (INCLUDING MRCP) TECHNIQUE: Multiplanar multisequence MR imaging of the abdomen was performed both before and after the administration of intravenous contrast. Heavily T2-weighted images of the biliary and pancreatic ducts were obtained, and three-dimensional MRCP images were rendered by post processing. CONTRAST:  7mL GADAVIST  GADOBUTROL  1 MMOL/ML IV SOLN COMPARISON:  Ultrasound abdomen and CT scan abdomen and pelvis from 05/11/2024. FINDINGS: Lower chest: Unremarkable MR appearance to the lung bases. No pleural effusion. No pericardial effusion. Normal heart size. Hepatobiliary: The liver is moderately enlarged in size measuring up to 17.7 cm in length. Noncirrhotic configuration. There are multiple (around 10), T2 hyperintense,, peripherally rim enhancing target like lesions throughout liver, which have increased in size and number since the prior study and compatible with worsening metastatic disease. There is redemonstration of heterogeneous contracted gallbladder with ill-defined heterogeneous peripherally irregular wall enhancing lesion in the fundus of the gallbladder which appears to infiltrate into the left hepatic lobe, segment 4B, suggesting  infiltrating tumor. The lesion measures up to 2.9 x 3.1 cm, grossly similar to the prior study., There is mild-to-moderate central intrahepatic bile duct dilation. There is abrupt narrowing/cutoff of the proximal extrahepatic bile duct. The mid/distal extrahepatic bile duct is nondilated. There is ill-defined peripheral irregular wall enhancing mass in the caudate lobe which is likely the cause of abrupt cutoff of the central biliary tree. Findings may be due to primary hilar  cholangiocarcinoma versus metastases. The middle and right intrahepatic veins are patent. Left hepatic vein is not well seen, likely thrombosed. There is moderate narrowing in the midportion of main portal vein (series 19, image 31). Right portal vein and its major tributaries are patent. However, there is nonvisualization of left portal vein, suggesting thrombosis. These findings are new since the prior study from 12/14/2023. Pancreas: No mass, inflammatory changes or other parenchymal abnormality identified. No main pancreatic duct dilation. Spleen: Top-normal spleen measuring upto 5.5 x 12.9 orthogonally on coronal plane. No focal mass. Adrenals/Urinary Tract: Bilateral adrenal adenomas noted measuring 1.1 x 1.5 cm on the left side and 1.0 x 1.5 cm on the right side. No hydroureteronephrosis. No suspicious renal mass. Stomach/Bowel: Visualized portions within the abdomen are unremarkable. No disproportionate dilation of bowel loops. Vascular/Lymphatic: No pathologically enlarged lymph nodes identified. No abdominal aortic aneurysm demonstrated. No ascites. Redemonstration of multiple nodular implants throughout the abdomen, compatible with peritoneal carcinomatosis. Overall, there is mild interval increase in the size, suggesting worsening peritoneal carcinomatosis. Other: Susceptibility artifact from thoracolumbar spine spinal fixation hardware noted. Musculoskeletal: No suspicious bone lesions identified. IMPRESSION: 1. There is redemonstration of heterogeneous contracted gallbladder with ill-defined heterogeneous peripherally irregular wall enhancing lesion in the fundus of the gallbladder which appears to infiltrate into the left hepatic lobe, segment 4B, suggesting infiltrating tumor. 2. There are multiple (around 10), T2 hyperintense, peripherally rim enhancing target like lesions throughout liver, which have increased in size and number since the prior study and compatible with worsening metastatic  disease. 3. There is mild-to-moderate central intrahepatic bile duct dilation. There is abrupt narrowing/cutoff of the proximal extrahepatic bile duct. The mid/distal extrahepatic bile duct is nondilated. There is ill-defined peripheral irregular wall enhancing mass in the caudate lobe which is likely the cause of abrupt cutoff of the central biliary tree. Findings may be due to primary hilar cholangiocarcinoma versus metastases. 4. There is moderate narrowing in the midportion of main portal vein. Right portal vein and its major tributaries are patent. However, there is nonvisualization of left portal vein, suggesting thrombosis. There is also new nonvisualization of left hepatic vein. These findings are new since the prior study from 12/14/2023. 5. Bilateral adrenal adenomas. Electronically Signed   By: Ree Molt M.D.   On: 05/13/2024 13:26   US  Abdomen Limited RUQ (LIVER/GB) Result Date: 05/11/2024 EXAM: Right Upper Quadrant Abdominal Ultrasound 05/11/2024 06:56:14 PM CLINICAL HISTORY: Abnormal transaminases. TECHNIQUE: Real-time ultrasonography of the right upper quadrant of the abdomen was performed. COMPARISON: US  Abdomen Limited 12/14/2023. CT abdomen pelvis 08/24/2023. FINDINGS: LIVER: Nodular hepatic contour. Coarsened echogenicity of the hepatic parenchyma. No intrahepatic biliary ductal dilatation. No focal liver lesions identified. BILIARY SYSTEM: Gallbladder wall not visualized. The common bile duct measures 6.0 mm. OTHER: No right upper quadrant ascites. IMPRESSION: 1. Cirrhosis. No focal liver lesions identified. Liver protocol contrast-enhanced MR or CT are most sensitive for HCC screening in cirrhosis. 2. Gallbladder wall not visualized. Electronically signed by: Morgane Naveau MD 05/11/2024 07:06 PM EDT RP Workstation: [id number]   CT ABDOMEN PELVIS W CONTRAST  Addendum Date: 05/11/2024 ** ADDENDUM #1 ** ADDENDUM: -- Clinicians worried about biliary obstruction. There is mild central  intrahepatic biliary dilatation, slightly increased when compared to the prior CT scan from 12/14/2023. The gallbladder is surgically absent. The cystic and solid appearing process in the gallbladder fossa is likely tumor and is grossly unchanged. The necrotic-appearing periportal nodal disease maybe slightly larger. It measures approximately 3.0x2.8 cm and previously measured 2.9x2.7 cm. There may be slight narrowing of the adjacent main portal vein and there also appears to be significant new stenosis of the left portal vein. MRI/MRCP may be helpful for further evaluation. -------------------------------------------------- Electronically signed by: Maude Stammer MD 05/11/2024 07:07 PM EDT RP Workstation: [id number]   Result Date: 05/11/2024 ** ORIGINAL REPORT ** EXAM: CT ABDOMEN AND PELVIS WITH CONTRAST 05/11/2024 03:50:05 PM TECHNIQUE: CT of the abdomen and pelvis was performed with the administration of 80 mL of iohexol  (OMNIPAQUE ) 300 MG/ML solution. Multiplanar reformatted images are provided for review. Automated exposure control, iterative reconstruction, and/or weight-based adjustment of the mA/kV was utilized to reduce the radiation dose to as low as reasonably achievable. COMPARISON: PET CT 03/21/2024 and CT abdomen/pelvis 12/14/2023. CLINICAL HISTORY: Bowel obstruction suspected; LLQ abdominal pain. FINDINGS: LOWER CHEST: Persistent/stable pulmonary metastatic disease. No pleural or pericardial effusion. Stable vascular calcifications. LIVER: Hepatic metastatic disease. Suspect new hepatic lesions in both hepatic lobes. 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 Persistent mild intrahepatic biliary dilatation. Stable necrotic-appearing periportal adenopathy. GALLBLADDER AND BILE DUCTS: Surgically absent. No biliary ductal dilatation. SPLEEN: No splenic lesions. PANCREAS: The pancreas is unremarkable and stable. ADRENAL GLANDS: Bilateral adrenal gland  nodules are stable. KIDNEYS, URETERS AND BLADDER: No renal lesions. The bladder is normal. No stones in the kidneys or ureters. No hydronephrosis. No perinephric or periureteral stranding. GI AND BOWEL: The stomach, duodenum, small bowel, and colon are unremarkable. No findings suspicious for bowel obstruction. Moderate stool burden. PERITONEUM AND RETROPERITONEUM: No free abdominal or free pelvic fluid. No mesenteric or retroperitoneal mass. Overall stable scattered omental implants near the liver and near the transverse colon. VASCULATURE: Stable advanced atherosclerotic calcifications involving the aorta and branch vessels but no aneurysm or dissection. The major venous structures are patent. LYMPH NODES: No lymphadenopathy. REPRODUCTIVE ORGANS: BONES AND SOFT TISSUES: The bony structures are intact. Extensive surgical changes involving the THORACIC and LUMBAR spines without obvious complicating features. Diffuse osteoporosis. No focal soft tissue abnormality. No inguinal mass or adenopathy. IMPRESSION: 1. No CT evidence of bowel obstruction. 2. Hepatic metastatic disease with suspected new hepatic lesions in both hepatic lobes. 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 3. Stable scattered omental implants near the liver and near the transverse colon. 4. Stable cystic and solid mass in the central pelvis measuring 4.8 cm. Electronically signed by: Maude Stammer MD 05/11/2024 04:38 PM EDT RP Workstation: [id number]

## 2024-05-16 NOTE — Plan of Care (Signed)
  Problem: Education: Goal: Knowledge of General Education information will improve Description: Including pain rating scale, medication(s)/side effects and non-pharmacologic comfort measures Outcome: Progressing   Problem: Clinical Measurements: Goal: Ability to maintain clinical measurements within normal limits will improve Outcome: Progressing Goal: Will remain free from infection Outcome: Progressing   Problem: Nutrition: Goal: Adequate nutrition will be maintained Outcome: Progressing   Problem: Elimination: Goal: Will not experience complications related to bowel motility Outcome: Progressing   Problem: Safety: Goal: Ability to remain free from injury will improve Outcome: Progressing

## 2024-05-16 NOTE — Plan of Care (Signed)
  Problem: Clinical Measurements: Goal: Will remain free from infection Outcome: Progressing Goal: Respiratory complications will improve Outcome: Progressing Goal: Cardiovascular complication will be avoided Outcome: Progressing   Problem: Activity: Goal: Risk for activity intolerance will decrease Outcome: Progressing   Problem: Education: Goal: Knowledge of General Education information will improve Description: Including pain rating scale, medication(s)/side effects and non-pharmacologic comfort measures Outcome: Not Progressing   Problem: Health Behavior/Discharge Planning: Goal: Ability to manage health-related needs will improve Outcome: Not Progressing   Problem: Clinical Measurements: Goal: Ability to maintain clinical measurements within normal limits will improve Outcome: Not Progressing Goal: Diagnostic test results will improve Outcome: Not Progressing   Problem: Nutrition: Goal: Adequate nutrition will be maintained Outcome: Not Progressing

## 2024-05-17 DIAGNOSIS — R7989 Other specified abnormal findings of blood chemistry: Secondary | ICD-10-CM | POA: Diagnosis not present

## 2024-05-17 DIAGNOSIS — R109 Unspecified abdominal pain: Secondary | ICD-10-CM | POA: Diagnosis not present

## 2024-05-17 DIAGNOSIS — R935 Abnormal findings on diagnostic imaging of other abdominal regions, including retroperitoneum: Secondary | ICD-10-CM | POA: Diagnosis not present

## 2024-05-17 DIAGNOSIS — C23 Malignant neoplasm of gallbladder: Secondary | ICD-10-CM | POA: Diagnosis not present

## 2024-05-17 LAB — CBC
HCT: 24.9 % — ABNORMAL LOW (ref 36.0–46.0)
Hemoglobin: 8 g/dL — ABNORMAL LOW (ref 12.0–15.0)
MCH: 32.8 pg (ref 26.0–34.0)
MCHC: 32.1 g/dL (ref 30.0–36.0)
MCV: 102 fL — ABNORMAL HIGH (ref 80.0–100.0)
Platelets: 124 K/uL — ABNORMAL LOW (ref 150–400)
RBC: 2.44 MIL/uL — ABNORMAL LOW (ref 3.87–5.11)
RDW: 22.5 % — ABNORMAL HIGH (ref 11.5–15.5)
WBC: 7.1 K/uL (ref 4.0–10.5)
nRBC: 0 % (ref 0.0–0.2)

## 2024-05-17 LAB — COMPREHENSIVE METABOLIC PANEL WITH GFR
ALT: 81 U/L — ABNORMAL HIGH (ref 0–44)
AST: 73 U/L — ABNORMAL HIGH (ref 15–41)
Albumin: 2.9 g/dL — ABNORMAL LOW (ref 3.5–5.0)
Alkaline Phosphatase: 227 U/L — ABNORMAL HIGH (ref 38–126)
Anion gap: 11 (ref 5–15)
BUN: 9 mg/dL (ref 8–23)
CO2: 22 mmol/L (ref 22–32)
Calcium: 8.5 mg/dL — ABNORMAL LOW (ref 8.9–10.3)
Chloride: 104 mmol/L (ref 98–111)
Creatinine, Ser: 0.79 mg/dL (ref 0.44–1.00)
GFR, Estimated: >60 mL/min (ref >60)
Glucose, Bld: 121 mg/dL — ABNORMAL HIGH (ref 70–99)
Potassium: 4.2 mmol/L (ref 3.5–5.1)
Sodium: 137 mmol/L (ref 135–145)
Total Bilirubin: 8.4 mg/dL — ABNORMAL HIGH (ref 0.0–1.2)
Total Protein: 5.5 g/dL — ABNORMAL LOW (ref 6.5–8.1)

## 2024-05-17 MED ORDER — SODIUM CHLORIDE 0.9 % IV SOLN: INTRAVENOUS | Status: AC

## 2024-05-17 MED ORDER — SODIUM CHLORIDE 0.9 % IV SOLN
2.0000 g | Freq: Once | INTRAVENOUS | Status: AC
  Administered 2024-05-18: 2 g via INTRAVENOUS

## 2024-05-17 NOTE — Progress Notes (Signed)
  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 - The disease burden is causing biliary obstruction and jaundice. - GI has evaluated the patient and because of the location, they cannot help with stenting/intervention.  Recommended IR evaluation for external biliary drain placement. -IR attempted external biliary drain placement on 05/14/2024 but it was not successful.  Plan for reevaluation on 05/17/2024 or 05/18/2024 for possible intervention. - Bilirubin remains elevated, peaked at 8.8 as of 05/15/2024.  It is 8.4 today. - Previously I discussed MRI findings with the patient and her family members.  Explained unfortunate disease progression.  Discussed need to switch systemic treatments however her bilirubin has to return to close to normal for her to be a candidate for further systemic treatments. - On 05/16/2024, I introduced the concept of hospice, if her clinical situation worsens or if she has progressive jaundice, which precludes systemic chemotherapy.  Patient verbalized understanding.  She would like to take 1 day at a time.  Awaiting IR re-evaluation before deciding on further plan of care. - We can hold off on anticoagulation at this time for her presumed portal vein thrombosis as noted on MRI/MRCP, until after the IR intervention.   Obstructive jaundice due to malignant biliary obstruction Obstructive jaundice secondary to malignant biliary obstruction from gallbladder cancer, with elevated bilirubin levels limiting chemotherapy options.  Interventional radiology is considering another attempt to place a drain to alleviate obstruction and reduce bilirubin levels. - Consulted interventional radiology for potential external biliary drain placement.  Tentative plan for repeat attempt either today or tomorrow for external biliary drain. - Monitor bilirubin levels and liver function tests.   Will continue to follow.  Please call us  with any questions.   Chinita Patten, MD 05/17/2024 5:06 PM  ONCOLOGY HISTORY:    78 y.o. lady with past medical history of essential hypertension, osteoarthritis, COPD/asthma, GERD, depression, anxiety, hiatal hernia, chronic back pain presented to the ED on 12/14/2023 with complaints of worsening epigastric/abdominal pain over 2-3 weeks.  Routine blood work was unremarkable in the ER.  However CT abdomen and pelvis showed irregular appearance of the gallbladder with suggestion of capsular thickening along the fundus of the gallbladder.  Gallbladder appeared inseparable from the adjacent portion of the liver.  Clinical picture was concerning for gallbladder carcinoma.  Mesenteric nodular thickening along the mid lower abdominal cavity indicating peritoneal carcinomatosis.  Soft tissue nodular mass in the left lower quadrant region of the pelvis measuring 3.6 x 2.7 cm, likely ovarian neoplastic lesion.  Multiple lung nodules in the lower lobes bilaterally, largest measuring 12 mm in the right lower lobe, concerning for pulmonary metastatic disease.   She was admitted to the hospital for further evaluation and management.  IR was consulted for peritoneal mass biopsy.  CA 19-9 was significant elevated at 14,368.  CEA was increased at 98.7, CA-125 was also increased at 91.3.   On 12/14/2023, MRI abdomen/MRCP showed thickened, contracted gallbladder with a heterogeneous mass arising from the gallbladder fundus, contiguous with the adjacent liver parenchyma of hepatic segment IVB measuring 3.5 x 2.8 cm. Multiple small rim  enhancing lesions within the liver parenchyma. Enlarged, necrotic appearing portacaval and porta hepatis lymph nodes. Small, although rim enhancing and abnormal appearing retroperitoneal lymph nodes. Peritoneal stranding and nodularity, incompletely visualized on this examination although present in the left upper quadrant, consistent with peritoneal metastatic disease. Numerous small bilateral pulmonary nodules, better assessed by CT. 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   On 12/15/2023, she underwent CT-guided biopsy of the omental lesion.  Pathology showed metastatic well to moderately differentiated adenocarcinoma. The tumor is positive for cytokeratin 7 and shows focal weak positivity for the GI marker CDX2.  The tumor is negative for the GI markers cytokeratin 20.  The tumor is also negative for the GU and GYN marker PAX8.  The tumor is negative for the pulmonary adeno marker TTF-1. This immunohistochemical pattern and histomorphology would be compatible with the clinical suspicion of a gallbladder primary.  The differential diagnosis would also include an upper GI or pancreaticobiliary primary.    Patient presented to our clinic on 12/24/2023 to establish care with us .  Request placed for staging PET scan.  NGS panel testing requested on the specimen and also liquid biopsy obtained.  It showed no actionable mutations.   Staging PET scan on 12/29/2023 showed known gallbladder mass extending into the liver.  Peritoneal carcinomatosis noted.  Numerous lung nodules were identified, small bowel with FDG uptake, concerning for metastatic disease in the lungs.  Metastatic lymphadenopathy was noted in the abdomen and retroperitoneum.  This scan will serve as a good baseline for future compression.    Plan for palliative systemic chemoimmunotherapy with cisplatin , gemcitabine , durvalumab . Scheduled to start C1D1 from 01/01/24.    Restaging PET  scan on 03/21/2024 showed overall improvement in disease.  She was tolerating treatments well up until early October 2025 when she started having some swallowing difficulty, clinical picture concerning for esophageal candidiasis and a skin rash which led to a break in systemic treatments   She presented to the ED on 05/11/2024 with complaints of abdominal pain and possible obstipation.  CT abdomen and pelvis on 05/11/2024 showed suspected new hepatic lesions in both hepatic lobes, mild central intrahepatic biliary dilatation.  Necrotic appearing periportal nodal disease may be slightly larger at 3 x 2.8 cm, compared to 2.9 x 2.7 cm previously.  There may be slight narrowing of the adjacent main portal vein and there also appears to be significant new stenosis of the left portal vein.  MRI/MRCP would be helpful for further evaluation.   Bilirubin has been slowly trending upwards.  Previously normal at 0.5 when we saw her in clinic.  It was 3.9 on arrival to ED.  Alkaline phosphatase, AST ALT were also elevated.  On 05/13/2024, MRI/MRCP showed evidence of disease progression with multiple new lesions throughout the liver which are increased in size and number, compatible with worsening metastatic disease.  GI consulted.  They could not do intervention because of the location of biliary obstruction.  On 05/14/2024, IR attempted external biliary drainage but it was not successful.  Plan for IR reevaluation on 05/17/2024 or 05/18/2024.  SUBJECTIVE:   She experiences pain radiating towards her back, which she attributes to lying in bed. The pain is not significantly more than what she has been feeling recently.  No nausea, vomiting, or shortness of breath. She was previously receiving fluids, which were discontinued last night.  An attempt by interventional radiology to place a drain to alleviate the pressure and reduce bilirubin levels was  unsuccessful.  IR is planning to attempt another procedure either  later today or tomorrow   OBJECTIVE:  Vitals:   05/17/24 0822 05/17/24 1417  BP:  115/64  Pulse:  94  Resp:  20  Temp:  99.1 F (37.3 C)  SpO2: 96% 97%     Intake/Output Summary (Last 24 hours) at 05/17/2024 1706 Last data filed at 05/17/2024 0645 Gross per 24 hour  Intake --  Output 850 ml  Net -850 ml    Physical Exam Constitutional:      General: She is not in acute distress.    Appearance: Normal appearance.  HENT:     Head: Normocephalic and atraumatic.  Eyes:     General: Scleral icterus present.  Cardiovascular:     Rate and Rhythm: Normal rate.  Pulmonary:     Effort: Pulmonary effort is normal. No respiratory distress.  Abdominal:     General: There is no distension.  Skin:    Coloration: Skin is jaundiced.     Findings: Rash (scattered maculopapular rash, improved) present.  Neurological:     General: No focal deficit present.     Mental Status: She is alert and oriented to person, place, and time.  Psychiatric:        Mood and Affect: Mood normal.        Behavior: Behavior normal.      LABS:   Results for orders placed or performed during the hospital encounter of 05/11/24 (from the past 24 hours)  CBC     Status: Abnormal   Collection Time: 05/17/24  7:59 AM  Result Value Ref Range   WBC 7.1 4.0 - 10.5 K/uL   RBC 2.44 (L) 3.87 - 5.11 MIL/uL   Hemoglobin 8.0 (L) 12.0 - 15.0 g/dL   HCT 75.0 (L) 63.9 - 53.9 %   MCV 102.0 (H) 80.0 - 100.0 fL   MCH 32.8 26.0 - 34.0 pg   MCHC 32.1 30.0 - 36.0 g/dL   RDW 77.4 (H) 88.4 - 84.4 %   Platelets 124 (L) 150 - 400 K/uL   nRBC 0.0 0.0 - 0.2 %  Comprehensive metabolic panel     Status: Abnormal   Collection Time: 05/17/24  7:59 AM  Result Value Ref Range   Sodium 137 135 - 145 mmol/L   Potassium 4.2 3.5 - 5.1 mmol/L   Chloride 104 98 - 111 mmol/L   CO2 22 22 - 32 mmol/L   Glucose, Bld 121 (H) 70 - 99 mg/dL   BUN 9 8 - 23 mg/dL   Creatinine, Ser 9.20 0.44 - 1.00 mg/dL   Calcium  8.5 (L) 8.9 - 10.3  mg/dL   Total Protein 5.5 (L) 6.5 - 8.1 g/dL   Albumin  2.9 (L) 3.5 - 5.0 g/dL   AST 73 (H) 15 - 41 U/L   ALT 81 (H) 0 - 44 U/L   Alkaline Phosphatase 227 (H) 38 - 126 U/L   Total Bilirubin 8.4 (H) 0.0 - 1.2 mg/dL   GFR, Estimated >39 >39 mL/min   Anion gap 11 5 - 15     IMAGING STUDIES:   IR BILIARY DRAIN PLACEMENT WITH CHOLANGIOGRAM Result Date: 05/14/2024 INDICATION: 78 year old with metastatic gallbladder carcinoma and found to have progressive disease. Patient has mild biliary dilatation and elevated bilirubin. Plan for percutaneous transhepatic cholangiogram and attempt biliary drain placement. EXAM: 1. Percutaneous transhepatic cholangiogram using ultrasound and fluoroscopic guidance. 2. Attempted placement of percutaneous biliary drain. MEDICATIONS: Cefoxitin 2 g; The antibiotic was  administered within an appropriate time frame prior to the initiation of the procedure. ANESTHESIA/SEDATION: Moderate (conscious) sedation was employed during this procedure. A total of Versed  4 mg and Fentanyl  150 mcg was administered intravenously by the radiology nurse. Total intra-service moderate Sedation Time: 70 minutes. The patient's level of consciousness and vital signs were monitored continuously by radiology nursing throughout the procedure under my direct supervision. FLUOROSCOPY: Radiation Exposure Index (as provided by the fluoroscopic device): 311 mGy Kerma COMPLICATIONS: None immediate. PROCEDURE: Informed written consent was obtained from the patient after a thorough discussion of the procedural risks, benefits and alternatives. All questions were addressed. Maximal Sterile Barrier Technique was utilized including caps, mask, sterile gowns, sterile gloves, sterile drape, hand hygiene and skin antiseptic. A timeout was performed prior to the initiation of the procedure. Right side of the abdomen was prepped and draped in sterile fashion. Ultrasound identified mild intrahepatic biliary dilatation in  the right hepatic lobe. Right side of the abdomen was anesthetized with 1% lidocaine . Using ultrasound guidance, 21 gauge needle was directed into a peripheral bile duct. Contrast injection confirmed placement in the biliary system. Multiple attempts were made to cannulate a peripheral bile duct using 0.018 wires. These attempts to cannulate the biliary system with a wire were unsuccessful. Additional bile ducts were targeted using ultrasound and fluoroscopic guidance but these bile ducts could not be successfully cannulated with a wire. Eventually, the procedure was aborted. Bandage placed at the puncture sites. Fluoroscopic and ultrasound images were taken and saved for documentation. FINDINGS: Ultrasound demonstrated mild intrahepatic biliary dilatation. A peripheral right hepatic bile duct was targeted with ultrasound. A peripheral right hepatic bile duct was successfully opacified with contrast. Mild to moderate dilatation in the central portion of this bile duct. An additional bile duct towards the hepatic dome was also opacified. There is no filling of the left hepatic ducts. Contrast did drain into the common bile duct and duodenum. Lack of contrast at the biliary confluence probably related to obstructing lesions. IMPRESSION: 1. Percutaneous transhepatic cholangiogram demonstrates mild intrahepatic biliary dilatation but incomplete opacification of the biliary system. Findings are suggestive for partial obstructive disease in the central aspect of the biliary system but there is drainage into the extrahepatic bile duct and duodenum. 2. Attempts to place a percutaneous biliary drain were unsuccessful due to the small size of the peripheral bile ducts. Electronically Signed   By: Juliene Balder M.D.   On: 05/14/2024 17:41   MR ABDOMEN MRCP W WO CONTAST Result Date: 05/13/2024 CLINICAL DATA:  Epigastric pain 885157 Epigastric pain 114842; Epigastric pain. EXAM: MRI ABDOMEN WITHOUT AND WITH CONTRAST (INCLUDING  MRCP) TECHNIQUE: Multiplanar multisequence MR imaging of the abdomen was performed both before and after the administration of intravenous contrast. Heavily T2-weighted images of the biliary and pancreatic ducts were obtained, and three-dimensional MRCP images were rendered by post processing. CONTRAST:  7mL GADAVIST  GADOBUTROL  1 MMOL/ML IV SOLN COMPARISON:  Ultrasound abdomen and CT scan abdomen and pelvis from 05/11/2024. FINDINGS: Lower chest: Unremarkable MR appearance to the lung bases. No pleural effusion. No pericardial effusion. Normal heart size. Hepatobiliary: The liver is moderately enlarged in size measuring up to 17.7 cm in length. Noncirrhotic configuration. There are multiple (around 10), T2 hyperintense,, peripherally rim enhancing target like lesions throughout liver, which have increased in size and number since the prior study and compatible with worsening metastatic disease. There is redemonstration of heterogeneous contracted gallbladder with ill-defined heterogeneous peripherally irregular wall enhancing lesion in the fundus of the  gallbladder which appears to infiltrate into the left hepatic lobe, segment 4B, suggesting infiltrating tumor. The lesion measures up to 2.9 x 3.1 cm, grossly similar to the prior study., There is mild-to-moderate central intrahepatic bile duct dilation. There is abrupt narrowing/cutoff of the proximal extrahepatic bile duct. The mid/distal extrahepatic bile duct is nondilated. There is ill-defined peripheral irregular wall enhancing mass in the caudate lobe which is likely the cause of abrupt cutoff of the central biliary tree. Findings may be due to primary hilar cholangiocarcinoma versus metastases. The middle and right intrahepatic veins are patent. Left hepatic vein is not well seen, likely thrombosed. There is moderate narrowing in the midportion of main portal vein (series 19, image 31). Right portal vein and its major tributaries are patent. However, there is  nonvisualization of left portal vein, suggesting thrombosis. These findings are new since the prior study from 12/14/2023. Pancreas: No mass, inflammatory changes or other parenchymal abnormality identified. No main pancreatic duct dilation. Spleen: Top-normal spleen measuring upto 5.5 x 12.9 orthogonally on coronal plane. No focal mass. Adrenals/Urinary Tract: Bilateral adrenal adenomas noted measuring 1.1 x 1.5 cm on the left side and 1.0 x 1.5 cm on the right side. No hydroureteronephrosis. No suspicious renal mass. Stomach/Bowel: Visualized portions within the abdomen are unremarkable. No disproportionate dilation of bowel loops. Vascular/Lymphatic: No pathologically enlarged lymph nodes identified. No abdominal aortic aneurysm demonstrated. No ascites. Redemonstration of multiple nodular implants throughout the abdomen, compatible with peritoneal carcinomatosis. Overall, there is mild interval increase in the size, suggesting worsening peritoneal carcinomatosis. Other: Susceptibility artifact from thoracolumbar spine spinal fixation hardware noted. Musculoskeletal: No suspicious bone lesions identified. IMPRESSION: 1. There is redemonstration of heterogeneous contracted gallbladder with ill-defined heterogeneous peripherally irregular wall enhancing lesion in the fundus of the gallbladder which appears to infiltrate into the left hepatic lobe, segment 4B, suggesting infiltrating tumor. 2. There are multiple (around 10), T2 hyperintense, peripherally rim enhancing target like lesions throughout liver, which have increased in size and number since the prior study and compatible with worsening metastatic disease. 3. There is mild-to-moderate central intrahepatic bile duct dilation. There is abrupt narrowing/cutoff of the proximal extrahepatic bile duct. The mid/distal extrahepatic bile duct is nondilated. There is ill-defined peripheral irregular wall enhancing mass in the caudate lobe which is likely the cause of  abrupt cutoff of the central biliary tree. Findings may be due to primary hilar cholangiocarcinoma versus metastases. 4. There is moderate narrowing in the midportion of main portal vein. Right portal vein and its major tributaries are patent. However, there is nonvisualization of left portal vein, suggesting thrombosis. There is also new nonvisualization of left hepatic vein. These findings are new since the prior study from 12/14/2023. 5. Bilateral adrenal adenomas. Electronically Signed   By: Ree Molt M.D.   On: 05/13/2024 13:26   MR 3D Recon At Scanner Result Date: 05/13/2024 CLINICAL DATA:  Epigastric pain 114842 Epigastric pain 114842; Epigastric pain. EXAM: MRI ABDOMEN WITHOUT AND WITH CONTRAST (INCLUDING MRCP) TECHNIQUE: Multiplanar multisequence MR imaging of the abdomen was performed both before and after the administration of intravenous contrast. Heavily T2-weighted images of the biliary and pancreatic ducts were obtained, and three-dimensional MRCP images were rendered by post processing. CONTRAST:  7mL GADAVIST  GADOBUTROL  1 MMOL/ML IV SOLN COMPARISON:  Ultrasound abdomen and CT scan abdomen and pelvis from 05/11/2024. FINDINGS: Lower chest: Unremarkable MR appearance to the lung bases. No pleural effusion. No pericardial effusion. Normal heart size. Hepatobiliary: The liver is moderately enlarged in size measuring up  to 17.7 cm in length. Noncirrhotic configuration. There are multiple (around 10), T2 hyperintense,, peripherally rim enhancing target like lesions throughout liver, which have increased in size and number since the prior study and compatible with worsening metastatic disease. There is redemonstration of heterogeneous contracted gallbladder with ill-defined heterogeneous peripherally irregular wall enhancing lesion in the fundus of the gallbladder which appears to infiltrate into the left hepatic lobe, segment 4B, suggesting infiltrating tumor. The lesion measures up to 2.9 x 3.1  cm, grossly similar to the prior study., There is mild-to-moderate central intrahepatic bile duct dilation. There is abrupt narrowing/cutoff of the proximal extrahepatic bile duct. The mid/distal extrahepatic bile duct is nondilated. There is ill-defined peripheral irregular wall enhancing mass in the caudate lobe which is likely the cause of abrupt cutoff of the central biliary tree. Findings may be due to primary hilar cholangiocarcinoma versus metastases. The middle and right intrahepatic veins are patent. Left hepatic vein is not well seen, likely thrombosed. There is moderate narrowing in the midportion of main portal vein (series 19, image 31). Right portal vein and its major tributaries are patent. However, there is nonvisualization of left portal vein, suggesting thrombosis. These findings are new since the prior study from 12/14/2023. Pancreas: No mass, inflammatory changes or other parenchymal abnormality identified. No main pancreatic duct dilation. Spleen: Top-normal spleen measuring upto 5.5 x 12.9 orthogonally on coronal plane. No focal mass. Adrenals/Urinary Tract: Bilateral adrenal adenomas noted measuring 1.1 x 1.5 cm on the left side and 1.0 x 1.5 cm on the right side. No hydroureteronephrosis. No suspicious renal mass. Stomach/Bowel: Visualized portions within the abdomen are unremarkable. No disproportionate dilation of bowel loops. Vascular/Lymphatic: No pathologically enlarged lymph nodes identified. No abdominal aortic aneurysm demonstrated. No ascites. Redemonstration of multiple nodular implants throughout the abdomen, compatible with peritoneal carcinomatosis. Overall, there is mild interval increase in the size, suggesting worsening peritoneal carcinomatosis. Other: Susceptibility artifact from thoracolumbar spine spinal fixation hardware noted. Musculoskeletal: No suspicious bone lesions identified. IMPRESSION: 1. There is redemonstration of heterogeneous contracted gallbladder with  ill-defined heterogeneous peripherally irregular wall enhancing lesion in the fundus of the gallbladder which appears to infiltrate into the left hepatic lobe, segment 4B, suggesting infiltrating tumor. 2. There are multiple (around 10), T2 hyperintense, peripherally rim enhancing target like lesions throughout liver, which have increased in size and number since the prior study and compatible with worsening metastatic disease. 3. There is mild-to-moderate central intrahepatic bile duct dilation. There is abrupt narrowing/cutoff of the proximal extrahepatic bile duct. The mid/distal extrahepatic bile duct is nondilated. There is ill-defined peripheral irregular wall enhancing mass in the caudate lobe which is likely the cause of abrupt cutoff of the central biliary tree. Findings may be due to primary hilar cholangiocarcinoma versus metastases. 4. There is moderate narrowing in the midportion of main portal vein. Right portal vein and its major tributaries are patent. However, there is nonvisualization of left portal vein, suggesting thrombosis. There is also new nonvisualization of left hepatic vein. These findings are new since the prior study from 12/14/2023. 5. Bilateral adrenal adenomas. Electronically Signed   By: Ree Molt M.D.   On: 05/13/2024 13:26   US  Abdomen Limited RUQ (LIVER/GB) Result Date: 05/11/2024 EXAM: Right Upper Quadrant Abdominal Ultrasound 05/11/2024 06:56:14 PM CLINICAL HISTORY: Abnormal transaminases. TECHNIQUE: Real-time ultrasonography of the right upper quadrant of the abdomen was performed. COMPARISON: US  Abdomen Limited 12/14/2023. CT abdomen pelvis 08/24/2023. FINDINGS: LIVER: Nodular hepatic contour. Coarsened echogenicity of the hepatic parenchyma. No intrahepatic biliary ductal  dilatation. No focal liver lesions identified. BILIARY SYSTEM: Gallbladder wall not visualized. The common bile duct measures 6.0 mm. OTHER: No right upper quadrant ascites. IMPRESSION: 1.  Cirrhosis. No focal liver lesions identified. Liver protocol contrast-enhanced MR or CT are most sensitive for HCC screening in cirrhosis. 2. Gallbladder wall not visualized. Electronically signed by: Morgane Naveau MD 05/11/2024 07:06 PM EDT RP Workstation: [id number]   CT ABDOMEN PELVIS W CONTRAST Addendum Date: 05/11/2024 ** ADDENDUM #1 ** ADDENDUM: -- Clinicians worried about biliary obstruction. There is mild central intrahepatic biliary dilatation, slightly increased when compared to the prior CT scan from 12/14/2023. The gallbladder is surgically absent. The cystic and solid appearing process in the gallbladder fossa is likely tumor and is grossly unchanged. The necrotic-appearing periportal nodal disease maybe slightly larger. It measures approximately 3.0x2.8 cm and previously measured 2.9x2.7 cm. There may be slight narrowing of the adjacent main portal vein and there also appears to be significant new stenosis of the left portal vein. MRI/MRCP may be helpful for further evaluation. -------------------------------------------------- Electronically signed by: Maude Stammer MD 05/11/2024 07:07 PM EDT RP Workstation: [id number]   Result Date: 05/11/2024 ** ORIGINAL REPORT ** EXAM: CT ABDOMEN AND PELVIS WITH CONTRAST 05/11/2024 03:50:05 PM TECHNIQUE: CT of the abdomen and pelvis was performed with the administration of 80 mL of iohexol  (OMNIPAQUE ) 300 MG/ML solution. Multiplanar reformatted images are provided for review. Automated exposure control, iterative reconstruction, and/or weight-based adjustment of the mA/kV was utilized to reduce the radiation dose to as low as reasonably achievable. COMPARISON: PET CT 03/21/2024 and CT abdomen/pelvis 12/14/2023. CLINICAL HISTORY: Bowel obstruction suspected; LLQ abdominal pain. FINDINGS: LOWER CHEST: Persistent/stable pulmonary metastatic disease. No pleural or pericardial effusion. Stable vascular calcifications. LIVER: Hepatic metastatic disease. Suspect  new hepatic lesions in both hepatic lobes. 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 Persistent mild intrahepatic biliary dilatation. Stable necrotic-appearing periportal adenopathy. GALLBLADDER AND BILE DUCTS: Surgically absent. No biliary ductal dilatation. SPLEEN: No splenic lesions. PANCREAS: The pancreas is unremarkable and stable. ADRENAL GLANDS: Bilateral adrenal gland nodules are stable. KIDNEYS, URETERS AND BLADDER: No renal lesions. The bladder is normal. No stones in the kidneys or ureters. No hydronephrosis. No perinephric or periureteral stranding. GI AND BOWEL: The stomach, duodenum, small bowel, and colon are unremarkable. No findings suspicious for bowel obstruction. Moderate stool burden. PERITONEUM AND RETROPERITONEUM: No free abdominal or free pelvic fluid. No mesenteric or retroperitoneal mass. Overall stable scattered omental implants near the liver and near the transverse colon. VASCULATURE: Stable advanced atherosclerotic calcifications involving the aorta and branch vessels but no aneurysm or dissection. The major venous structures are patent. LYMPH NODES: No lymphadenopathy. REPRODUCTIVE ORGANS: BONES AND SOFT TISSUES: The bony structures are intact. Extensive surgical changes involving the THORACIC and LUMBAR spines without obvious complicating features. Diffuse osteoporosis. No focal soft tissue abnormality. No inguinal mass or adenopathy. IMPRESSION: 1. No CT evidence of bowel obstruction. 2. Hepatic metastatic disease with suspected new hepatic lesions in both hepatic lobes. 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 3. Stable scattered omental implants near the liver and near the transverse colon. 4. Stable cystic and solid mass in the central pelvis measuring 4.8 cm. Electronically signed by: Maude Stammer MD 05/11/2024 04:38 PM EDT RP Workstation: [id number]

## 2024-05-17 NOTE — Progress Notes (Signed)
 Referring Physician(s): Regalado,B/Sherrill,B/Pasam,A  Supervising Physician: Jennefer Rover  Patient Status:  Janice Pace - In-pt  Chief Complaint:  Metastatic gallbladder carcinoma, biliary obstruction, hyperbilirubinemia  Subjective: Patient known to IR team from omental mass biopsy on 12/15/2023, Port-A-Cath placement on 01/07/2024 and unsuccessful attempt at biliary drain placement on 05/14/2024.  She has a history of metastatic gallbladder carcinoma with biliary obstruction and rising total bilirubin.  Request now received for reattempted biliary drain placement.  Patient currently denies fever, headache, chest pain, worsening dyspnea, cough, nausea, vomiting or bleeding.  She remains jaundiced, has some abdominal and back discomfort.     Past Medical History:  Diagnosis Date   Arthritis    Asthma    Bronchitis    Cancer (HCC)    dermatosivros arcoma and protuverans   Carpal tunnel syndrome, left upper limb 11/06/2017   Chronic back pain    degenerative disc disease   Constipation    stool softener daily   Constipation    COPD (chronic obstructive pulmonary disease) (HCC)    GERD (gastroesophageal reflux disease)    takes Prilosec daily   GERD (gastroesophageal reflux disease)    Heart murmur    Heart valve disorder    Hemorrhoids    History of hiatal hernia    Ileus (HCC) 03/02/2021   Insomnia    d/t chantix    Joint pain    Nocturia    Ovarian failure 05/28/2022   Personal history of colonic adenoma 03/13/2003   03/13/2003 - 5 mm adenoma   Pre-diabetes    Prediabetes    White coat syndrome without diagnosis of hypertension    Past Surgical History:  Procedure Laterality Date   ABDOMINAL HYSTERECTOMY     ANTERIOR LAT LUMBAR FUSION N/A 02/27/2021   Procedure: Anterior lateral interbody fusion - lateral two - lateral three - lateral three - lateral four with exploration fusion L4-S1;  Surgeon: Onetha Kuba, MD;  Location: [facility] OR;  Service: Neurosurgery;  Laterality: N/A;    BACK SURGERY  1988   BACK SURGERY  09/03/2011   rods,screws x 8    BACK SURGERY     09/2022   BUNIONECTOMY     bilateral   BUNIONECTOMY     bil feet   COLONOSCOPY     dermatosibrosarcoma protuberans     IR BILIARY DRAIN PLACEMENT WITH CHOLANGIOGRAM  05/14/2024   IR IMAGING GUIDED PORT INSERTION  01/07/2024   LAMINECTOMY WITH POSTERIOR LATERAL ARTHRODESIS LEVEL 3 N/A 02/27/2021   Procedure: Posterior augmentation with pedicle screws Latreal one - lateral four - Posterior Lateral and Interbody fusion;  Surgeon: Onetha Kuba, MD;  Location: [facility] OR;  Service: Neurosurgery;  Laterality: N/A;   PARTIAL HYSTERECTOMY     RECTOCELE REPAIR     Rectum Repair     RESECTION TUMOR WRIST RADICAL     x 2 rt   TUBAL LIGATION  1974   TUBAL LIGATION     WRIST SURGERY     left      Allergies: Sulfa antibiotics, Forteo  [parathyroid hormone (recomb)], Atorvastatin, and Metformin hcl  Medications: Prior to Admission medications   Medication Sig Start Date End Date Taking? Authorizing Provider  albuterol  (VENTOLIN  HFA) 108 (90 Base) MCG/ACT inhaler Inhale 1-2 puffs into the lungs every 6 (six) hours as needed for wheezing or shortness of breath. 04/11/24  Yes Kara Dorn NOVAK, MD  aspirin  81 MG EC tablet Take 81 mg by mouth daily.   Yes [provider]  budesonide -glycopyrrolate -formoterol  (BREZTRI   AEROSPHERE) 160-9-4.8 MCG/ACT AERO inhaler Inhale 2 puffs into the lungs in the morning and at bedtime. 04/11/24  Yes Kara Dorn NOVAK, MD  docusate sodium  (COLACE) 100 MG capsule Take 500 mg by mouth at bedtime.   Yes [provider]  fluconazole  (DIFLUCAN ) 200 MG tablet Take 1 tablet (200 mg total) by mouth daily. 04/26/24  Yes Pasam, Avinash, MD  lidocaine -prilocaine  (EMLA ) cream Apply to affected area once Patient taking differently: Apply 1 Application topically as needed (for port access). 12/24/23  Yes Pasam, Avinash, MD  loratadine  (CLARITIN ) 10 MG tablet Take 10 mg by mouth See  admin instructions. Take 10 mg by mouth once a day on non-chemo weeks   Yes [provider]  losartan (COZAAR) 50 MG tablet Take 1 tablet (50 mg total) by mouth daily. 05/09/24  Yes Gayle Numbers F, PA-C  magic mouthwash (nystatin, lidocaine , diphenhydrAMINE , alum & mag hydroxide) suspension Take 5 mLs by mouth every 4 (four) hours as needed for mouth pain. Suspension contains equal amounts of Maalox, nystatin, diphenhydramine  and lidocaine . 1:1:1:1 ratio. 04/28/24  Yes Pasam, Avinash, MD  magnesium  oxide (MAG-OX) 400 (240 Mg) MG tablet Take 1 tablet (400 mg total) by mouth 2 (two) times daily. 05/04/24  Yes Pasam, Avinash, MD  megestrol  (MEGACE  ES) 625 MG/5ML suspension Take 5 mLs (625 mg total) by mouth daily. 01/27/24  Yes Pasam, Avinash, MD  metoprolol  succinate (TOPROL -XL) 50 MG 24 hr tablet TAKE 1 TABLET BY MOUTH ONCE DAILY. TAKE WITH OR IMMEDIATELY FOLLOWING A MEAL Patient taking differently: Take 50 mg by mouth daily after breakfast. 11/11/23  Yes Chandra Toribio POUR, MD  morphine  (MS CONTIN ) 15 MG 12 hr tablet Take 1 tablet (15 mg total) by mouth every 12 (twelve) hours. 05/06/24  Yes Pasam, Avinash, MD  omeprazole  (PRILOSEC) 20 MG capsule Take 1 capsule (20 mg total) by mouth daily. Patient taking differently: Take 20 mg by mouth See admin instructions. Take 20 mg by mouth in the morning and evening- 30 minutes before a meal 05/02/24  Yes Clapp, Kara F, PA-C  ondansetron  (ZOFRAN ) 8 MG tablet Take 1 tablet (8 mg total) by mouth every 8 (eight) hours as needed for nausea or vomiting. Start on the third day after cisplatin . 12/24/23  Yes Pasam, Avinash, MD  oxyCODONE  10 MG TABS Take 1-1.5 tablets (10-15 mg total) by mouth every 4 (four) hours as needed for moderate pain (pain score 4-6) or severe pain (pain score 7-10). 12/16/23  Yes Laurence Locus, DO  polyethylene glycol (MIRALAX  / GLYCOLAX ) 17 g packet Take 17 g by mouth 2 (two) times daily as needed for mild constipation or moderate constipation.    Yes [provider]  prochlorperazine  (COMPAZINE ) 10 MG tablet Take 1 tablet (10 mg total) by mouth every 6 (six) hours as needed (Nausea or vomiting). 12/24/23  Yes Pasam, Avinash, MD  dexamethasone  (DECADRON ) 4 MG tablet Take 2 tablets (8 mg) by mouth daily x 3 days starting the day after cisplatin  chemotherapy. Take with food. Patient not taking: Reported on 05/13/2024 12/24/23   Pasam, Chinita, MD  methylPREDNISolone  (MEDROL  DOSEPAK) 4 MG TBPK tablet Take as directed on package instructions Patient not taking: Reported on 05/13/2024 04/22/24   Pasam, Chinita, MD  ondansetron  (ZOFRAN ) 4 MG tablet Take 1 tablet (4 mg total) by mouth every 6 (six) hours as needed for nausea. Patient not taking: Reported on 05/13/2024 12/16/23   Laurence Locus, DO  rosuvastatin  (CRESTOR ) 10 MG tablet Take 1 tablet by mouth once  daily 05/16/24   Gayle Saddie FALCON, PA-C  spironolactone  (ALDACTONE ) 25 MG tablet TAKE 1 TABLET BY MOUTH ONCE DAILY IN THE MORNING 05/06/24   Ladona Heinz, MD     Vital Signs: BP (!) 148/68 (BP Location: Right Arm)   Pulse 96   Temp 98.3 F (36.8 C) (Oral)   Resp 20   Ht 5' 2 (1.575 m)   Wt 159 lb (72.1 kg)   SpO2 96%   BMI 29.08 kg/m   Physical Exam: Patient awake, alert.  Scleral icterus noted.  Chest with slightly diminished breath sounds right base, some distant breath sounds on left.  Clean, intact right chest wall Port-A-Cath.  Heart with regular rate and rhythm, soft murmur.  Abdomen soft, slightly distended, tenderness epigastric region to palpation ;no lower extremity edema  Imaging: IR BILIARY DRAIN PLACEMENT WITH CHOLANGIOGRAM Result Date: 05/14/2024 INDICATION: 78 year old with metastatic gallbladder carcinoma and found to have progressive disease. Patient has mild biliary dilatation and elevated bilirubin. Plan for percutaneous transhepatic cholangiogram and attempt biliary drain placement. EXAM: 1. Percutaneous transhepatic cholangiogram using ultrasound and fluoroscopic  guidance. 2. Attempted placement of percutaneous biliary drain. MEDICATIONS: Cefoxitin 2 g; The antibiotic was administered within an appropriate time frame prior to the initiation of the procedure. ANESTHESIA/SEDATION: Moderate (conscious) sedation was employed during this procedure. A total of Versed  4 mg and Fentanyl  150 mcg was administered intravenously by the radiology nurse. Total intra-service moderate Sedation Time: 70 minutes. The patient's level of consciousness and vital signs were monitored continuously by radiology nursing throughout the procedure under my direct supervision. FLUOROSCOPY: Radiation Exposure Index (as provided by the fluoroscopic device): 311 mGy Kerma COMPLICATIONS: None immediate. PROCEDURE: Informed written consent was obtained from the patient after a thorough discussion of the procedural risks, benefits and alternatives. All questions were addressed. Maximal Sterile Barrier Technique was utilized including caps, mask, sterile gowns, sterile gloves, sterile drape, hand hygiene and skin antiseptic. A timeout was performed prior to the initiation of the procedure. Right side of the abdomen was prepped and draped in sterile fashion. Ultrasound identified mild intrahepatic biliary dilatation in the right hepatic lobe. Right side of the abdomen was anesthetized with 1% lidocaine . Using ultrasound guidance, 21 gauge needle was directed into a peripheral bile duct. Contrast injection confirmed placement in the biliary system. Multiple attempts were made to cannulate a peripheral bile duct using 0.018 wires. These attempts to cannulate the biliary system with a wire were unsuccessful. Additional bile ducts were targeted using ultrasound and fluoroscopic guidance but these bile ducts could not be successfully cannulated with a wire. Eventually, the procedure was aborted. Bandage placed at the puncture sites. Fluoroscopic and ultrasound images were taken and saved for documentation. FINDINGS:  Ultrasound demonstrated mild intrahepatic biliary dilatation. A peripheral right hepatic bile duct was targeted with ultrasound. A peripheral right hepatic bile duct was successfully opacified with contrast. Mild to moderate dilatation in the central portion of this bile duct. An additional bile duct towards the hepatic dome was also opacified. There is no filling of the left hepatic ducts. Contrast did drain into the common bile duct and duodenum. Lack of contrast at the biliary confluence probably related to obstructing lesions. IMPRESSION: 1. Percutaneous transhepatic cholangiogram demonstrates mild intrahepatic biliary dilatation but incomplete opacification of the biliary system. Findings are suggestive for partial obstructive disease in the central aspect of the biliary system but there is drainage into the extrahepatic bile duct and duodenum. 2. Attempts to place a percutaneous biliary drain were unsuccessful  due to the small size of the peripheral bile ducts. Electronically Signed   By: Juliene Balder M.D.   On: 05/14/2024 17:41    Labs:  CBC: Recent Labs    05/14/24 0420 05/15/24 0441 05/16/24 0505 05/17/24 0759  WBC 7.8 7.7 6.8 7.1  HGB 8.5* 8.5* 7.7* 8.0*  HCT 26.7* 26.7* 24.0* 24.9*  PLT 110* 125* 108* 124*    COAGS: Recent Labs    12/14/23 1825 05/14/24 0420  INR 1.1 1.2    BMP: Recent Labs    05/14/24 0420 05/15/24 0441 05/16/24 0505 05/17/24 0759  NA 138 136 137 137  K 3.9 3.6 4.0 4.2  CL 105 105 105 104  CO2 23 22 23 22   GLUCOSE 108* 123* 111* 121*  BUN 11 10 9 9   CALCIUM  8.3* 8.5* 8.5* 8.5*  CREATININE 0.81 0.82 0.73 0.79  GFRNONAA >60 >60 >60 >60    LIVER FUNCTION TESTS: Recent Labs    05/14/24 0420 05/15/24 0441 05/16/24 0505 05/17/24 0759  BILITOT 6.8* 8.8* 6.7* 8.4*  AST 79* 102* 75* 73*  ALT 103* 114* 88* 81*  ALKPHOS 191* 217* 206* 227*  PROT 5.2* 5.3* 5.1* 5.5*  ALBUMIN  3.1* 3.1* 2.9* 2.9*    Assessment and Plan: Patient with history of  metastatic gallbladder carcinoma, biliary obstruction, abdominal pain, COPD, anemia/thrombocytopenia, left portal vein /hepatic vein thrombosis, hyperbilirubinemia; status post unsuccessful biliary drain placement on 05/14/2024 due to small size of peripheral bile ducts; total bilirubin rising, now 8.4 up from 6.7.  Request now received for reattempt at biliary drain placement.  Details/risks of procedure, including but not limited to, internal bleeding, infection, injury to adjacent structures, inability to place drain discussed with patient, spouse and daughter with their understanding and consent.  Procedure tentatively scheduled for later today or tomorrow.    Electronically Signed: D. Franky Rakers, PA-C 05/17/2024, 1:09 PM   I spent a total of 25 Minutes at the the patient's bedside AND on the patient's hospital floor or unit, greater than 50% of which was counseling/coordinating care for image guided percutaneous transhepatic cholangiogram with biliary drain placement    Patient ID: Janice Pace, female   DOB: [birth year], 78 y.o.   MRN: [id number]

## 2024-05-17 NOTE — Progress Notes (Signed)
 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  CT abdomen and pelvis was negative for obstruction but showed 2 new lesion in the hepatic lobe.  Presented with transaminases.   Assessment & Plan:   Active Problems:   Carcinoma of gall bladder (HCC)   Abdominal pain   LFT elevation   Abnormal CT of the abdomen  1-Abdominal Pain ; Acute on chronic In the setting of Metastatic stage IV gallbladder cancer Urine retention and Constipation -Patient presented with worsening abdominal pain, inability to urinate or have a bowel movement since Sunday. -CT abdomen and pelvis: No evidence of bowel obstruction.  Hepatic metastatic disease with suspected new hepatic lesion in both hepatic lobes.  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  Stable scattered omental implants near the liver and near the transverse colon.  Stable cystic and solid mass in the central pelvis measuring 4.8 cm -Continue with MS Contin ,  oxycodone  as needed and IV morphine  PRN. - Oncology consulted and following.  - Continue with Robaxin  PRN  Report pain is controlled with current pain regimen.  IR consulted for Biliary drain placement. Fail attempt on 11/01 Plan for re attempt biliary placement by IR Tuesday or Wednesday.  Bilirubin increased to 8. - Oncology has discussed with patient consideration for hospice if her clinical situation worsen or if she continued to have progressive jaundice  Transaminases, Hyperbilirubinemia: In the setting of hepatic  metastatic disease, - CT unable to evaluate bile ducts. -US  showed cirrhosis, gallbladder wall not visualized -Per radiology report, there  could be decrease size of portal vein, MRCP ordered.  -MRCP: Worsening liver metastasis diseases, concern for left portal vein thrombosis ?, hepatic biliary tree obstruction. GI has been consulted, not candidate for ERCP stent placement obstruction is too high in the liver. IR consulted for external drain placement.  -Await oncology recommendation for ? Portal vein thrombosis.  Plan for re attempt biliary drain placement.   Anemia; of chronic diseases, malignancy  Monitor. Hb stable at 8   -Hypertension: Continue to hold lisinopril in the setting of AKI.  As needed hydralazine  ordered -Started   Norvasc.   AKI:  Concern for urinary retention, Cr peak 1.3 In the setting of poor oral intake and possible urine retention Improved with IV fluids NSL  Hypokalemia: Replaced COPD: Continue Breztri  inhaler twice daily as needed nebulizer   Constipation: Continue MiraLAX , Senokot. Had BM 10/30/ added lactulose.  Having BM.  Oral thrush: Completed diflucan .  Dermatitis the setting of recent immuno chemotherapy.  Improving      Estimated body mass index is 29.08 kg/m as calculated from the following:   Height as of this encounter: 5' 2 (1.575 m).   Weight as of this encounter: 72.1 kg.   DVT prophylaxis: Lovenox  Code Status: DNR Family Communication: Husband who was at bedside and daughter  Disposition Plan:  Status is: Observation The patient will require care spanning > 2 midnights and should be moved to inpatient because: Agement of pain    Consultants:  Oncology   Procedures:  US   Antimicrobials:    Subjective: Report having shoulder  blade pain today. We discussed is likely refer pain from cancer gallbladder.  Having BM. I informed nurse , patient need Pain meds.   Objective: Vitals:   05/16/24 2056 05/17/24 0550 05/17/24  0822 05/17/24 1417  BP:  (!) 148/68  115/64  Pulse:  96  94  Resp:  20  20  Temp:  98.3 F (36.8 C)  99.1 F (37.3 C)  TempSrc:  Oral  Oral  SpO2: 98% 97% 96% 97%  Weight:      Height:        Intake/Output Summary (Last 24 hours) at 05/17/2024 1506 Last data filed at 05/17/2024 0645 Gross per 24 hour  Intake --  Output 850 ml  Net -850 ml   Filed Weights   05/11/24 2101  Weight: 72.1 kg    Examination:  General exam: NAD, Itcteric Respiratory system: CTA Cardiovascular system: S 1, S 2 RRR Gastrointestinal system: BS present, soft, nt Central nervous system: Alert Extremities: no edema   Data Reviewed: I have personally reviewed following labs and imaging studies  CBC: Recent Labs  Lab 05/11/24 1445 05/12/24 0442 05/13/24 1309 05/14/24 0420 05/15/24 0441 05/16/24 0505 05/17/24 0759  WBC 6.3   < > 5.6 7.8 7.7 6.8 7.1  NEUTROABS 3.7  --   --   --   --   --   --   HGB 9.3*   < > 8.2* 8.5* 8.5* 7.7* 8.0*  HCT 27.8*   < > 25.7* 26.7* 26.7* 24.0* 24.9*  MCV 98.2   < > 99.6 100.8* 101.1* 100.8* 102.0*  PLT 125*   < > 105* 110* 125* 108* 124*   < > = values in this interval not displayed.   Basic Metabolic Panel: Recent Labs  Lab 05/13/24 1309 05/14/24 0420 05/15/24 0441 05/16/24 0505 05/17/24 0759  NA 139 138 136 137 137  K 3.4* 3.9 3.6 4.0 4.2  CL 105 105 105 105 104  CO2 22 23 22 23 22   GLUCOSE 158* 108* 123* 111* 121*  BUN 10 11 10 9 9   CREATININE 0.91 0.81 0.82 0.73 0.79  CALCIUM  8.4* 8.3* 8.5* 8.5* 8.5*   GFR: Estimated Creatinine Clearance: 54.8 mL/min (by C-G formula based on SCr of 0.79 mg/dL). Liver Function Tests: Recent Labs  Lab 05/13/24 1309 05/14/24 0420 05/15/24 0441 05/16/24 0505 05/17/24 0759  AST 89* 79* 102* 75* 73*  ALT 111* 103* 114* 88* 81*  ALKPHOS 185* 191* 217* 206* 227*  BILITOT 5.7* 6.8* 8.8* 6.7* 8.4*  PROT 5.2* 5.2* 5.3* 5.1* 5.5*  ALBUMIN  3.2* 3.1* 3.1* 2.9* 2.9*   Recent Labs  Lab 05/11/24 1445  LIPASE  24   No results for input(s): AMMONIA in the last 168 hours. Coagulation Profile: Recent Labs  Lab 05/14/24 0420  INR 1.2   Cardiac Enzymes: No results for input(s): CKTOTAL, CKMB, CKMBINDEX, TROPONINI in the last 168 hours. BNP (last 3 results) No results for input(s): PROBNP in the last 8760 hours. HbA1C: No results for input(s): HGBA1C in the last 72 hours. CBG: No results for input(s): GLUCAP in the last 168 hours. Lipid Profile: No results for input(s): CHOL, HDL, LDLCALC, TRIG, CHOLHDL, LDLDIRECT in the last 72 hours. Thyroid  Function Tests: No results for input(s): TSH, T4TOTAL, FREET4, T3FREE, THYROIDAB in the last 72 hours. Anemia Panel: No results for input(s): VITAMINB12, FOLATE, FERRITIN, TIBC, IRON, RETICCTPCT in the last 72 hours. Sepsis Labs: No results for input(s): PROCALCITON, LATICACIDVEN in the last 168 hours.  No results  found for this or any previous visit (from the past 240 hours).       Radiology Studies: US  Abdomen Limited RUQ (LIVER/GB) Result Date: 05/11/2024 EXAM: Right Upper Quadrant Abdominal Ultrasound 05/11/2024 06:56:14 PM CLINICAL HISTORY: Abnormal transaminases. TECHNIQUE: Real-time ultrasonography of the right upper quadrant of the abdomen was performed. COMPARISON: US  Abdomen Limited 12/14/2023. CT abdomen pelvis 08/24/2023. FINDINGS: LIVER: Nodular hepatic contour. Coarsened echogenicity of the hepatic parenchyma. No intrahepatic biliary ductal dilatation. No focal liver lesions identified. BILIARY SYSTEM: Gallbladder wall not visualized. The common bile duct measures 6.0 mm. OTHER: No right upper quadrant ascites. IMPRESSION: 1. Cirrhosis. No focal liver lesions identified. Liver protocol contrast-enhanced MR or CT are most sensitive for HCC screening in cirrhosis. 2. Gallbladder wall not visualized. Electronically signed by: Morgane Naveau MD 05/11/2024 07:06 PM EDT RP Workstation:  [id number]   CT ABDOMEN PELVIS W CONTRAST Addendum Date: 05/11/2024 ADDENDUM #1ADDENDUM: -- Clinicians worried about biliary obstruction. There is mild central intrahepatic biliary dilatation, slightly increased when compared to the prior CT scan from 12/14/2023. The gallbladder is surgically absent. The cystic and solid appearing process in the gallbladder fossa is likely tumor and is grossly unchanged. The necrotic-appearing periportal nodal disease maybe slightly larger. It measures approximately 3.0x2.8 cm and previously measured 2.9x2.7 cm. There may be slight narrowing of the adjacent main portal vein and there also appears to be significant new stenosis of the left portal vein. MRI/MRCP may be helpful for further evaluation. -------------------------------------------------- Electronically signed by: Maude Stammer MD 05/11/2024 07:07 PM EDT RP Workstation: [id number]   Result Date: 05/11/2024 ORIGINAL REPORTEXAM: CT ABDOMEN AND PELVIS WITH CONTRAST 05/11/2024 03:50:05 PM TECHNIQUE: CT of the abdomen and pelvis was performed with the administration of 80 mL of iohexol  (OMNIPAQUE ) 300 MG/ML solution. Multiplanar reformatted images are provided for review. Automated exposure control, iterative reconstruction, and/or weight-based adjustment of the mA/kV was utilized to reduce the radiation dose to as low as reasonably achievable. COMPARISON: PET CT 03/21/2024 and CT abdomen/pelvis 12/14/2023. CLINICAL HISTORY: Bowel obstruction suspected; LLQ abdominal pain. FINDINGS: LOWER CHEST: Persistent/stable pulmonary metastatic disease. No pleural or pericardial effusion. Stable vascular calcifications. LIVER: Hepatic metastatic disease. Suspect new hepatic lesions in both hepatic lobes. 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 Persistent mild intrahepatic biliary dilatation. Stable necrotic-appearing periportal adenopathy. GALLBLADDER AND BILE DUCTS: Surgically  absent. No biliary ductal dilatation. SPLEEN: No splenic lesions. PANCREAS: The pancreas is unremarkable and stable. ADRENAL GLANDS: Bilateral adrenal gland nodules are stable. KIDNEYS, URETERS AND BLADDER: No renal lesions. The bladder is normal. No stones in the kidneys or ureters. No hydronephrosis. No perinephric or periureteral stranding. GI AND BOWEL: The stomach, duodenum, small bowel, and colon are unremarkable. No findings suspicious for bowel obstruction. Moderate stool burden. PERITONEUM AND RETROPERITONEUM: No free abdominal or free pelvic fluid. No mesenteric or retroperitoneal mass. Overall stable scattered omental implants near the liver and near the transverse colon. VASCULATURE: Stable advanced atherosclerotic calcifications involving the aorta and branch vessels but no aneurysm or dissection. The major venous structures are patent. LYMPH NODES: No lymphadenopathy. REPRODUCTIVE ORGANS: BONES AND SOFT TISSUES: The bony structures are intact. Extensive surgical changes involving the THORACIC and LUMBAR spines without obvious complicating features. Diffuse osteoporosis. No focal soft tissue abnormality. No inguinal mass or adenopathy. IMPRESSION: 1. No CT evidence of bowel obstruction. 2. Hepatic metastatic disease with suspected new hepatic lesions in both hepatic lobes. 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 3.  Stable scattered omental implants near the liver and near the transverse colon. 4. Stable cystic and solid mass in the central pelvis measuring 4.8 cm. Electronically signed by: Maude Stammer MD 05/11/2024 04:38 PM EDT RP Workstation: [id number]        Scheduled Meds:  amLODipine  5 mg Oral Daily   budesonide -glycopyrrolate -formoterol   2 puff Inhalation BID   Chlorhexidine  Gluconate Cloth  6 each Topical Daily   docusate sodium   100 mg Oral BID   feeding supplement  237 mL Oral BID BM   lactulose  20 g Oral BID   morphine   15 mg  Oral Q12H   polyethylene glycol  17 g Oral BID   senna  1 tablet Oral BID   sodium phosphate   1 enema Rectal Once   Continuous Infusions:  sodium chloride  75 mL/hr at 05/17/24 1410   cefOXitin        LOS: 5 days    Time spent: 35 minutes    Brynnleigh Mcelwee A Holy B[place]field, MD [facility]   If 7PM-7AM, please contact night-coverage [email or web address]  05/17/2024, 3:06 PM

## 2024-05-17 NOTE — Progress Notes (Signed)
 Mobility Specialist Progress Note:   05/17/24 1542  Mobility  Activity Ambulated with assistance  Level of Assistance Minimal assist, patient does 75% or more  Assistive Device Front wheel walker  Distance Ambulated (ft) 60 ft  Activity Response Tolerated well  Mobility Referral Yes  Mobility visit 1 Mobility  Mobility Specialist Start Time (ACUTE ONLY) 1520  Mobility Specialist Stop Time (ACUTE ONLY) 1540  Mobility Specialist Time Calculation (min) (ACUTE ONLY) 20 min   Pt was received in bed and agreed to mobility. Min A sit to stand. Returned to bed with all needs met. Call bell in reach. Left in room with family.  [employer] - Mobility Specialist

## 2024-05-17 NOTE — Progress Notes (Signed)
 Mobility Specialist Progress Note:   05/17/24 1021  Mobility  Activity Ambulated with assistance  Level of Assistance Contact guard assist, steadying assist  Assistive Device Front wheel walker  Distance Ambulated (ft) 20 ft  Activity Response Tolerated fair  Mobility Referral Yes  Mobility visit 1 Mobility  Mobility Specialist Start Time (ACUTE ONLY) 0954  Mobility Specialist Stop Time (ACUTE ONLY) 1004  Mobility Specialist Time Calculation (min) (ACUTE ONLY) 10 min   Pt was received in bed and agreed to mobility. Pt stated dizziness sitting edge of bed but quickly subsided. CGA sit to stand. Pt also stated leg weakness. Returned to bed with all needs met. Left in room with family.  [employer] - Mobility Specialist

## 2024-05-18 ENCOUNTER — Inpatient Hospital Stay (HOSPITAL_COMMUNITY)

## 2024-05-18 DIAGNOSIS — C801 Malignant (primary) neoplasm, unspecified: Secondary | ICD-10-CM | POA: Diagnosis not present

## 2024-05-18 DIAGNOSIS — C23 Malignant neoplasm of gallbladder: Secondary | ICD-10-CM | POA: Diagnosis not present

## 2024-05-18 DIAGNOSIS — K831 Obstruction of bile duct: Secondary | ICD-10-CM | POA: Diagnosis not present

## 2024-05-18 DIAGNOSIS — R109 Unspecified abdominal pain: Secondary | ICD-10-CM | POA: Diagnosis not present

## 2024-05-18 DIAGNOSIS — R7989 Other specified abnormal findings of blood chemistry: Secondary | ICD-10-CM | POA: Diagnosis not present

## 2024-05-18 HISTORY — PX: IR PARACENTESIS: IMG2679

## 2024-05-18 HISTORY — PX: IR BILIARY DRAIN PLACEMENT WITH CHOLANGIOGRAM: IMG6043

## 2024-05-18 MED ORDER — FENTANYL CITRATE (PF) 100 MCG/2ML IJ SOLN
INTRAMUSCULAR | Status: AC
  Filled 2024-05-18: qty 2

## 2024-05-18 MED ORDER — MIDAZOLAM HCL 2 MG/2ML IJ SOLN
INTRAMUSCULAR | Status: AC
  Filled 2024-05-18: qty 2

## 2024-05-18 MED ORDER — MIDAZOLAM HCL 2 MG/2ML IJ SOLN
INTRAMUSCULAR | Status: AC
  Filled 2024-05-18: qty 4

## 2024-05-18 MED ORDER — LIDOCAINE HCL 1 % IJ SOLN
INTRAMUSCULAR | Status: AC
  Filled 2024-05-18: qty 20

## 2024-05-18 MED ORDER — SODIUM CHLORIDE 0.9 % IV SOLN
INTRAVENOUS | Status: AC
  Filled 2024-05-18: qty 2

## 2024-05-18 MED ORDER — PANTOPRAZOLE SODIUM 40 MG PO TBEC
40.0000 mg | DELAYED_RELEASE_TABLET | Freq: Two times a day (BID) | ORAL | Status: DC
Start: 2024-05-18 — End: 2024-05-21
  Administered 2024-05-18 – 2024-05-21 (×6): 40 mg via ORAL
  Filled 2024-05-18 (×6): qty 1

## 2024-05-18 MED ORDER — IOHEXOL 300 MG/ML  SOLN
50.0000 mL | Freq: Once | INTRAMUSCULAR | Status: AC | PRN
  Administered 2024-05-18: 30 mL

## 2024-05-18 MED ORDER — HYDROMORPHONE HCL 1 MG/ML IJ SOLN
INTRAMUSCULAR | Status: AC | PRN
  Administered 2024-05-18: 1 mg via INTRAVENOUS

## 2024-05-18 MED ORDER — SODIUM CHLORIDE 0.9% FLUSH
5.0000 mL | Freq: Three times a day (TID) | INTRAVENOUS | Status: DC
  Administered 2024-05-18 – 2024-05-21 (×8): 5 mL

## 2024-05-18 MED ORDER — ORAL CARE MOUTH RINSE: 15.0000 mL | OROMUCOSAL | Status: DC | PRN

## 2024-05-18 MED ORDER — HYDROMORPHONE HCL 1 MG/ML IJ SOLN
INTRAMUSCULAR | Status: AC
  Filled 2024-05-18: qty 1

## 2024-05-18 MED ORDER — MIDAZOLAM HCL (PF) 2 MG/2ML IJ SOLN
INTRAMUSCULAR | Status: AC | PRN
  Administered 2024-05-18 (×8): 1 mg via INTRAVENOUS

## 2024-05-18 MED ORDER — LIDOCAINE HCL 1 % IJ SOLN: 20.0000 mL | Freq: Once | INTRAMUSCULAR | Status: AC

## 2024-05-18 MED ORDER — MIDAZOLAM HCL 2 MG/2ML IJ SOLN
INTRAMUSCULAR | Status: AC
Start: 2024-05-18 — End: 2024-05-18
  Filled 2024-05-18: qty 2

## 2024-05-18 MED ORDER — LIDOCAINE HCL 1 % IJ SOLN
20.0000 mL | Freq: Once | INTRAMUSCULAR | Status: AC
  Administered 2024-05-18: 20 mL via INTRADERMAL

## 2024-05-18 MED ORDER — FENTANYL CITRATE (PF) 100 MCG/2ML IJ SOLN
INTRAMUSCULAR | Status: AC | PRN
  Administered 2024-05-18 (×4): 50 ug via INTRAVENOUS

## 2024-05-18 NOTE — Progress Notes (Signed)
 PROGRESS NOTE    JOHNNETTE Pace  FMW:[id number] DOB: 20-Aug-1945 DOA: 05/11/2024 PCP: Gayle Saddie FALCON, PA-C    Brief Narrative:   Janice Pace is a 78 y.o. female with past medical history significant for stage IV gallbladder carcinoma with peritoneal and lung metastasis currently on immunotherapy, COPD, asthma, GERD, prediabetes who presented to [facility] ED on 05/11/2024 with complaints of abdominal pain.  Pain reported as a cramping sensation, difficulty with urination and bowel movement.  In the ED, temperature 98.6 F, HR 80, RR 18, BP 171/100, SpO2 100% on room air.  WBC 6.3, hemoglobin 9.3, platelet count 125.  Sodium 141, Tessman 3.3, chloride 106, CO2 23, glucose 95, BUN 16, creat 1.36.  AST 116, ALT 128, total bilirubin 2.8.  Urinalysis unrevealing.  CT abdomen/pelvis with contrast with no evidence of bowel obstruction, noted hepatic metastatic disease with suspected new hepatic lesions in both lobes, stable changes to gallbladder cancer with involvement of left hepatic lobe, scattered omental implants near liver and transverse colon, stable cystic solid mass central pelvis measuring 4.8 cm.  TRH consulted for admission for further evaluation management of suspected obstructive jaundice with imaging evidence of metastatic disease progression.  Assessment & Plan:   Obstructive jaundice Stage IV gallbladder carcinoma with peritoneal, lung, hepatic metastasis; progressive Patient presenting to the ED with abdominal pain.  CT abdomen/pelvis, MRCP with findings of worsening disease progression with multiple new liver lesions.  Suspect disease burden likely causing biliary obstruction and jaundice.  Evaluated by gastroenterology, due to location unable to assist with stenting or intervention and recommended IR for external biliary drain placement.  Seen by IR on 05/14/2024, unfortunately not successful. -- Medical oncology, Dr. Autumn following; appreciate assistance -- IR following,  planning reattempt at external biliary drain placement today, n.p.o. -- AST 116>>79>>75>73 -- ALT 128>>114>88>81 -- Tbili 2.8>>8.8>6.7>8.4 -- If unable to place drain; patient would not be eligible for systemic chemotherapy as this would need bilirubin within normal limits to initiate and recommendations would be to transition to a more comfort approach with hospice. -- Repeat CMP in a.m.  Pain of malignancy -- MS Contin  50 mg p.o. every 12 hours -- Oxycodone  10-50 mg p.o. every 4 hours.  Moderate pain -- Morphine  2 mg IV every 2 hours as needed severe pain -- Robaxin  500 mg IV every 6 hours as needed muscle spasms  Constipation -- Senna twice daily -- Lactulose 20 g p.o. twice daily -- MiraLAX  twice daily -- Bisacodyl  suppository 10 mg PR daily as needed moderate constipation  Urinary retention -- Foley catheter placed  HTN -- Amlodipine 5 mg p.o. daily -- Hold home losartan and spironolactone  for now  COPD Asthma -- Breztri  2 puffs twice daily -- Adderall neb every 2 hours.  Wheezing/shortness of breath  GERD -- Protonix  40 mg p.o. twice daily (substituted for home omeprazole )  HLD -- Hold home Crestor  for now given elevated LFTs  Prediabetes Hemoglobin A1c 5.6 on 12/22/2023  DVT prophylaxis: SCDs Start: 05/11/24 1814    Code Status: Limited: Do not attempt resuscitation (DNR) -DNR-LIMITED -Do Not Intubate/DNI  Family Communication: Updated spouse present bedside this morning  Disposition Plan:  Level of care: Med-Surg Status is: Inpatient Remains inpatient appropriate because: Pending reattempt at IR external biliary drain placement    Consultants:  [facility], Dr. Charlanne Interventional radiology Medical oncology, Dr. Autumn  Procedures:  Attempted PTC with drain placement, IR 11/1  Antimicrobials:  Perioperative cefoxitin 11/1   Subjective: Patient seen examined  bedside, lying in bed.  Spouse present at bedside.  No specific complaints this morning.   Awaiting for reattempt at percutaneous biliary drain placement by IR today.  Denies headache, no dizziness, no chest pain, no shortness of breath, no abdominal pain, no fever/chills/night sweats, no nausea/vomiting/diarrhea, no focal weakness, no fatigue, no paresthesias.  No acute events overnight per nurse staff.  Objective: Vitals:   05/17/24 2125 05/18/24 0618 05/18/24 1032 05/18/24 1318  BP: 118/84 (!) 150/74  (!) 136/59  Pulse: 96 87  93  Resp: 18 18  16   Temp: 98.4 F (36.9 C) 98.3 F (36.8 C)  98.2 F (36.8 C)  TempSrc:    Oral  SpO2: 99% 99% 99% 98%  Weight:      Height:        Intake/Output Summary (Last 24 hours) at 05/18/2024 1345 Last data filed at 05/18/2024 1220 Gross per 24 hour  Intake 1304.51 ml  Output 775 ml  Net 529.51 ml   Filed Weights   05/11/24 2101  Weight: 72.1 kg    Examination:  Physical Exam: GEN: NAD, alert and oriented x 3, + diffuse jaundice HEENT: NCAT, PERRL, EOMI, sclera clear, MMM PULM: CTAB w/o wheezes/crackles, normal respiratory effort, on room air CV: RRR w/o M/G/R GI: abd soft, NTND, NABS, no R/G/M GU: Noted Foley catheter in place MSK: no peripheral edema, moves all extremities independently NEURO: No focal neurologic deficit PSYCH: normal mood/affect Integumentary: + Diffuse jaundice, otherwise no other concerning rashes/lesions/wounds noted exposed skin surfaces    Data Reviewed: I have personally reviewed following labs and imaging studies  CBC: Recent Labs  Lab 05/13/24 1309 05/14/24 0420 05/15/24 0441 05/16/24 0505 05/17/24 0759  WBC 5.6 7.8 7.7 6.8 7.1  HGB 8.2* 8.5* 8.5* 7.7* 8.0*  HCT 25.7* 26.7* 26.7* 24.0* 24.9*  MCV 99.6 100.8* 101.1* 100.8* 102.0*  PLT 105* 110* 125* 108* 124*   Basic Metabolic Panel: Recent Labs  Lab 05/13/24 1309 05/14/24 0420 05/15/24 0441 05/16/24 0505 05/17/24 0759  NA 139 138 136 137 137  K 3.4* 3.9 3.6 4.0 4.2  CL 105 105 105 105 104  CO2 22 23 22 23 22   GLUCOSE 158*  108* 123* 111* 121*  BUN 10 11 10 9 9   CREATININE 0.91 0.81 0.82 0.73 0.79  CALCIUM  8.4* 8.3* 8.5* 8.5* 8.5*   GFR: Estimated Creatinine Clearance: 54.8 mL/min (by C-G formula based on SCr of 0.79 mg/dL). Liver Function Tests: Recent Labs  Lab 05/13/24 1309 05/14/24 0420 05/15/24 0441 05/16/24 0505 05/17/24 0759  AST 89* 79* 102* 75* 73*  ALT 111* 103* 114* 88* 81*  ALKPHOS 185* 191* 217* 206* 227*  BILITOT 5.7* 6.8* 8.8* 6.7* 8.4*  PROT 5.2* 5.2* 5.3* 5.1* 5.5*  ALBUMIN  3.2* 3.1* 3.1* 2.9* 2.9*   No results for input(s): LIPASE, AMYLASE in the last 168 hours. No results for input(s): AMMONIA in the last 168 hours. Coagulation Profile: Recent Labs  Lab 05/14/24 0420  INR 1.2   Cardiac Enzymes: No results for input(s): CKTOTAL, CKMB, CKMBINDEX, TROPONINI in the last 168 hours. BNP (last 3 results) No results for input(s): PROBNP in the last 8760 hours. HbA1C: No results for input(s): HGBA1C in the last 72 hours. CBG: No results for input(s): GLUCAP in the last 168 hours. Lipid Profile: No results for input(s): CHOL, HDL, LDLCALC, TRIG, CHOLHDL, LDLDIRECT in the last 72 hours. Thyroid  Function Tests: No results for input(s): TSH, T4TOTAL, FREET4, T3FREE, THYROIDAB in the last 72 hours. Anemia Panel:  No results for input(s): VITAMINB12, FOLATE, FERRITIN, TIBC, IRON, RETICCTPCT in the last 72 hours. Sepsis Labs: No results for input(s): PROCALCITON, LATICACIDVEN in the last 168 hours.  No results found for this or any previous visit (from the past 240 hours).       Radiology Studies: No results found.      Scheduled Meds:  amLODipine  5 mg Oral Daily   budesonide -glycopyrrolate -formoterol   2 puff Inhalation BID   Chlorhexidine  Gluconate Cloth  6 each Topical Daily   docusate sodium   100 mg Oral BID   feeding supplement  237 mL Oral BID BM   lactulose  20 g Oral BID   morphine   15 mg Oral Q12H    polyethylene glycol  17 g Oral BID   senna  1 tablet Oral BID   sodium phosphate   1 enema Rectal Once   Continuous Infusions:  sodium chloride  75 mL/hr at 05/18/24 0736   cefOXitin       LOS: 6 days    Time spent: 49 minutes spent on 05/18/2024 caring for this patient face-to-face including chart review, ordering labs/tests, documenting, discussion with nursing staff, consultants, updating family and interview/physical exam    Camellia PARAS Terrion Poblano, DO [facility] Available via Epic secure chat 7am-7pm After these hours, please refer to coverage provider listed on [email or web address] 05/18/2024, 1:45 PM

## 2024-05-18 NOTE — Progress Notes (Signed)
 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 - The disease burden is causing biliary obstruction and jaundice. - GI has evaluated the patient and because of the location, they cannot help with stenting/intervention.  Recommended IR evaluation for external biliary drain placement. -IR attempted external biliary drain placement on 05/14/2024 but it was not successful.  IR is attempting to place external biliary drain again later today. - Bilirubin remains elevated, peaked at 8.8 as of 05/15/2024.  It was 8.4 on 05/17/24. - Previously I discussed MRI findings with the patient and her family members.  Explained unfortunate disease progression.  Discussed need to switch systemic treatments however her bilirubin has to return to close to normal for her to be a candidate for further systemic treatments. - On 05/16/2024, I introduced the concept of hospice, if her clinical situation worsens or if she has progressive jaundice, which precludes systemic chemotherapy.  Patient verbalized understanding.  She would like to take 1 day at a time.  Awaiting IR re-evaluation before deciding on further plan of care. - We can hold off on anticoagulation at this time for her presumed portal vein thrombosis as noted on MRI/MRCP, until after the IR intervention.   Obstructive jaundice due to malignant biliary obstruction Obstructive jaundice secondary to malignant biliary obstruction from gallbladder cancer, with elevated bilirubin levels limiting chemotherapy options.  Interventional radiology is considering another attempt to place a drain to alleviate obstruction and reduce bilirubin levels. - Consulted interventional radiology for potential external biliary drain placement.  IR is taking her to the OR later today for another attempt. - Monitor bilirubin levels and liver function tests.   Will continue to follow.  Please call us  with any questions.   Chinita Patten, MD 05/18/2024 4:37 PM  ONCOLOGY HISTORY:    78 y.o. lady with past medical history of essential hypertension, osteoarthritis, COPD/asthma, GERD, depression, anxiety, hiatal hernia, chronic back pain presented to the ED on 12/14/2023 with complaints of worsening epigastric/abdominal pain over 2-3 weeks.  Routine blood work was unremarkable in the ER.  However CT abdomen and pelvis showed irregular appearance of the gallbladder with suggestion of capsular thickening along the fundus of the gallbladder.  Gallbladder appeared inseparable from the adjacent portion of the liver.  Clinical picture was concerning for gallbladder carcinoma.  Mesenteric nodular thickening along the mid lower abdominal cavity indicating peritoneal carcinomatosis.  Soft tissue nodular mass in the left lower quadrant region of the pelvis measuring 3.6 x 2.7 cm, likely ovarian neoplastic lesion.  Multiple lung nodules in the lower lobes bilaterally, largest measuring 12 mm in the right lower lobe, concerning for pulmonary metastatic disease.   She was admitted to the hospital for further evaluation and management.  IR was consulted for peritoneal mass biopsy.  CA 19-9 was significant elevated at 14,368.  CEA was increased at 98.7, CA-125 was also increased at 91.3.   On 12/14/2023, MRI abdomen/MRCP showed thickened, contracted gallbladder with a heterogeneous mass arising from the gallbladder fundus, contiguous with the adjacent liver parenchyma of hepatic segment IVB measuring 3.5 x 2.8 cm. Multiple small rim  enhancing lesions within  the liver parenchyma. Enlarged, necrotic appearing portacaval and porta hepatis lymph nodes. Small, although rim enhancing and abnormal appearing retroperitoneal lymph nodes. Peritoneal stranding and nodularity, incompletely visualized on this examination although present in the left upper quadrant, consistent with peritoneal metastatic disease. Numerous small bilateral pulmonary nodules, better assessed by CT. 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   On 12/15/2023, she underwent CT-guided biopsy of the omental lesion.  Pathology showed metastatic well to moderately differentiated adenocarcinoma. The tumor is positive for cytokeratin 7 and shows focal weak positivity for the GI marker CDX2.  The tumor is negative for the GI markers cytokeratin 20.  The tumor is also negative for the GU and GYN marker PAX8.  The tumor is negative for the pulmonary adeno marker TTF-1. This immunohistochemical pattern and histomorphology would be compatible with the clinical suspicion of a gallbladder primary.  The differential diagnosis would also include an upper GI or pancreaticobiliary primary.    Patient presented to our clinic on 12/24/2023 to establish care with us .  Request placed for staging PET scan.  NGS panel testing requested on the specimen and also liquid biopsy obtained.  It showed no actionable mutations.   Staging PET scan on 12/29/2023 showed known gallbladder mass extending into the liver.  Peritoneal carcinomatosis noted.  Numerous lung nodules were identified, small bowel with FDG uptake, concerning for metastatic disease in the lungs.  Metastatic lymphadenopathy was noted in the abdomen and retroperitoneum.  This scan will serve as a good baseline for future compression.    Plan for palliative systemic chemoimmunotherapy with cisplatin , gemcitabine , durvalumab . Scheduled to start C1D1 from 01/01/24.    Restaging PET scan on 03/21/2024 showed  overall improvement in disease.  She was tolerating treatments well up until early October 2025 when she started having some swallowing difficulty, clinical picture concerning for esophageal candidiasis and a skin rash which led to a break in systemic treatments   She presented to the ED on 05/11/2024 with complaints of abdominal pain and possible obstipation.  CT abdomen and pelvis on 05/11/2024 showed suspected new hepatic lesions in both hepatic lobes, mild central intrahepatic biliary dilatation.  Necrotic appearing periportal nodal disease may be slightly larger at 3 x 2.8 cm, compared to 2.9 x 2.7 cm previously.  There may be slight narrowing of the adjacent main portal vein and there also appears to be significant new stenosis of the left portal vein.  MRI/MRCP would be helpful for further evaluation.   Bilirubin has been slowly trending upwards.  Previously normal at 0.5 when we saw her in clinic.  It was 3.9 on arrival to ED.  Alkaline phosphatase, AST ALT were also elevated.  On 05/13/2024, MRI/MRCP showed evidence of disease progression with multiple new lesions throughout the liver which are increased in size and number, compatible with worsening metastatic disease.  GI consulted.  They could not do intervention because of the location of biliary obstruction.  On 05/14/2024, IR attempted external biliary drainage but it was not successful.  Plan for IR reevaluation on 05/17/2024 or 05/18/2024.  SUBJECTIVE:   She is experiencing pain, which was more significant yesterday compared to today.  She was allowed to eat dinner last night but did not consume a large amount. No nausea has been reported.   OBJECTIVE:  Vitals:   05/18/24 1625 05/18/24 1630  BP: (!) 162/76 (!) 151/71  Pulse: 97 95  Resp: 17 15  Temp:  SpO2: 96% 95%     Intake/Output Summary (Last 24 hours) at 05/18/2024 1637 Last data filed at 05/18/2024 1220 Gross per 24 hour  Intake 1304.51 ml  Output 775 ml  Net  529.51 ml    Physical Exam Constitutional:      General: She is not in acute distress.    Appearance: Normal appearance.  HENT:     Head: Normocephalic and atraumatic.  Eyes:     General: Scleral icterus present.  Cardiovascular:     Rate and Rhythm: Normal rate.  Pulmonary:     Effort: Pulmonary effort is normal. No respiratory distress.  Abdominal:     General: There is no distension.  Skin:    Coloration: Skin is jaundiced.     Findings: Rash (scattered maculopapular rash, improved) present.  Neurological:     General: No focal deficit present.     Mental Status: She is alert and oriented to person, place, and time.  Psychiatric:        Mood and Affect: Mood normal.        Behavior: Behavior normal.      LABS:   No results found for this or any previous visit (from the past 24 hours).    IMAGING STUDIES:   IR BILIARY DRAIN PLACEMENT WITH CHOLANGIOGRAM Result Date: 05/14/2024 INDICATION: 78 year old with metastatic gallbladder carcinoma and found to have progressive disease. Patient has mild biliary dilatation and elevated bilirubin. Plan for percutaneous transhepatic cholangiogram and attempt biliary drain placement. EXAM: 1. Percutaneous transhepatic cholangiogram using ultrasound and fluoroscopic guidance. 2. Attempted placement of percutaneous biliary drain. MEDICATIONS: Cefoxitin 2 g; The antibiotic was administered within an appropriate time frame prior to the initiation of the procedure. ANESTHESIA/SEDATION: Moderate (conscious) sedation was employed during this procedure. A total of Versed  4 mg and Fentanyl  150 mcg was administered intravenously by the radiology nurse. Total intra-service moderate Sedation Time: 70 minutes. The patient's level of consciousness and vital signs were monitored continuously by radiology nursing throughout the procedure under my direct supervision. FLUOROSCOPY: Radiation Exposure Index (as provided by the fluoroscopic device): 311 mGy Kerma  COMPLICATIONS: None immediate. PROCEDURE: Informed written consent was obtained from the patient after a thorough discussion of the procedural risks, benefits and alternatives. All questions were addressed. Maximal Sterile Barrier Technique was utilized including caps, mask, sterile gowns, sterile gloves, sterile drape, hand hygiene and skin antiseptic. A timeout was performed prior to the initiation of the procedure. Right side of the abdomen was prepped and draped in sterile fashion. Ultrasound identified mild intrahepatic biliary dilatation in the right hepatic lobe. Right side of the abdomen was anesthetized with 1% lidocaine . Using ultrasound guidance, 21 gauge needle was directed into a peripheral bile duct. Contrast injection confirmed placement in the biliary system. Multiple attempts were made to cannulate a peripheral bile duct using 0.018 wires. These attempts to cannulate the biliary system with a wire were unsuccessful. Additional bile ducts were targeted using ultrasound and fluoroscopic guidance but these bile ducts could not be successfully cannulated with a wire. Eventually, the procedure was aborted. Bandage placed at the puncture sites. Fluoroscopic and ultrasound images were taken and saved for documentation. FINDINGS: Ultrasound demonstrated mild intrahepatic biliary dilatation. A peripheral right hepatic bile duct was targeted with ultrasound. A peripheral right hepatic bile duct was successfully opacified with contrast. Mild to moderate dilatation in the central portion of this bile duct. An additional bile duct towards the hepatic dome was also opacified. There is no filling of the left  hepatic ducts. Contrast did drain into the common bile duct and duodenum. Lack of contrast at the biliary confluence probably related to obstructing lesions. IMPRESSION: 1. Percutaneous transhepatic cholangiogram demonstrates mild intrahepatic biliary dilatation but incomplete opacification of the biliary  system. Findings are suggestive for partial obstructive disease in the central aspect of the biliary system but there is drainage into the extrahepatic bile duct and duodenum. 2. Attempts to place a percutaneous biliary drain were unsuccessful due to the small size of the peripheral bile ducts. Electronically Signed   By: Juliene Balder M.D.   On: 05/14/2024 17:41   MR ABDOMEN MRCP W WO CONTAST Result Date: 05/13/2024 CLINICAL DATA:  Epigastric pain [id number] Epigastric pain 114842; Epigastric pain. EXAM: MRI ABDOMEN WITHOUT AND WITH CONTRAST (INCLUDING MRCP) TECHNIQUE: Multiplanar multisequence MR imaging of the abdomen was performed both before and after the administration of intravenous contrast. Heavily T2-weighted images of the biliary and pancreatic ducts were obtained, and three-dimensional MRCP images were rendered by post processing. CONTRAST:  7mL GADAVIST  GADOBUTROL  1 MMOL/ML IV SOLN COMPARISON:  Ultrasound abdomen and CT scan abdomen and pelvis from 05/11/2024. FINDINGS: Lower chest: Unremarkable MR appearance to the lung bases. No pleural effusion. No pericardial effusion. Normal heart size. Hepatobiliary: The liver is moderately enlarged in size measuring up to 17.7 cm in length. Noncirrhotic configuration. There are multiple (around 10), T2 hyperintense,, peripherally rim enhancing target like lesions throughout liver, which have increased in size and number since the prior study and compatible with worsening metastatic disease. There is redemonstration of heterogeneous contracted gallbladder with ill-defined heterogeneous peripherally irregular wall enhancing lesion in the fundus of the gallbladder which appears to infiltrate into the left hepatic lobe, segment 4B, suggesting infiltrating tumor. The lesion measures up to 2.9 x 3.1 cm, grossly similar to the prior study., There is mild-to-moderate central intrahepatic bile duct dilation. There is abrupt narrowing/cutoff of the proximal extrahepatic bile  duct. The mid/distal extrahepatic bile duct is nondilated. There is ill-defined peripheral irregular wall enhancing mass in the caudate lobe which is likely the cause of abrupt cutoff of the central biliary tree. Findings may be due to primary hilar cholangiocarcinoma versus metastases. The middle and right intrahepatic veins are patent. Left hepatic vein is not well seen, likely thrombosed. There is moderate narrowing in the midportion of main portal vein (series 19, image 31). Right portal vein and its major tributaries are patent. However, there is nonvisualization of left portal vein, suggesting thrombosis. These findings are new since the prior study from 12/14/2023. Pancreas: No mass, inflammatory changes or other parenchymal abnormality identified. No main pancreatic duct dilation. Spleen: Top-normal spleen measuring upto 5.5 x 12.9 orthogonally on coronal plane. No focal mass. Adrenals/Urinary Tract: Bilateral adrenal adenomas noted measuring 1.1 x 1.5 cm on the left side and 1.0 x 1.5 cm on the right side. No hydroureteronephrosis. No suspicious renal mass. Stomach/Bowel: Visualized portions within the abdomen are unremarkable. No disproportionate dilation of bowel loops. Vascular/Lymphatic: No pathologically enlarged lymph nodes identified. No abdominal aortic aneurysm demonstrated. No ascites. Redemonstration of multiple nodular implants throughout the abdomen, compatible with peritoneal carcinomatosis. Overall, there is mild interval increase in the size, suggesting worsening peritoneal carcinomatosis. Other: Susceptibility artifact from thoracolumbar spine spinal fixation hardware noted. Musculoskeletal: No suspicious bone lesions identified. IMPRESSION: 1. There is redemonstration of heterogeneous contracted gallbladder with ill-defined heterogeneous peripherally irregular wall enhancing lesion in the fundus of the gallbladder which appears to infiltrate into the left hepatic lobe, segment 4B,  suggesting infiltrating tumor.  2. There are multiple (around 10), T2 hyperintense, peripherally rim enhancing target like lesions throughout liver, which have increased in size and number since the prior study and compatible with worsening metastatic disease. 3. There is mild-to-moderate central intrahepatic bile duct dilation. There is abrupt narrowing/cutoff of the proximal extrahepatic bile duct. The mid/distal extrahepatic bile duct is nondilated. There is ill-defined peripheral irregular wall enhancing mass in the caudate lobe which is likely the cause of abrupt cutoff of the central biliary tree. Findings may be due to primary hilar cholangiocarcinoma versus metastases. 4. There is moderate narrowing in the midportion of main portal vein. Right portal vein and its major tributaries are patent. However, there is nonvisualization of left portal vein, suggesting thrombosis. There is also new nonvisualization of left hepatic vein. These findings are new since the prior study from 12/14/2023. 5. Bilateral adrenal adenomas. Electronically Signed   By: Ree Molt M.D.   On: 05/13/2024 13:26   MR 3D Recon At Scanner Result Date: 05/13/2024 CLINICAL DATA:  Epigastric pain 114842 Epigastric pain 114842; Epigastric pain. EXAM: MRI ABDOMEN WITHOUT AND WITH CONTRAST (INCLUDING MRCP) TECHNIQUE: Multiplanar multisequence MR imaging of the abdomen was performed both before and after the administration of intravenous contrast. Heavily T2-weighted images of the biliary and pancreatic ducts were obtained, and three-dimensional MRCP images were rendered by post processing. CONTRAST:  7mL GADAVIST  GADOBUTROL  1 MMOL/ML IV SOLN COMPARISON:  Ultrasound abdomen and CT scan abdomen and pelvis from 05/11/2024. FINDINGS: Lower chest: Unremarkable MR appearance to the lung bases. No pleural effusion. No pericardial effusion. Normal heart size. Hepatobiliary: The liver is moderately enlarged in size measuring up to 17.7 cm in  length. Noncirrhotic configuration. There are multiple (around 10), T2 hyperintense,, peripherally rim enhancing target like lesions throughout liver, which have increased in size and number since the prior study and compatible with worsening metastatic disease. There is redemonstration of heterogeneous contracted gallbladder with ill-defined heterogeneous peripherally irregular wall enhancing lesion in the fundus of the gallbladder which appears to infiltrate into the left hepatic lobe, segment 4B, suggesting infiltrating tumor. The lesion measures up to 2.9 x 3.1 cm, grossly similar to the prior study., There is mild-to-moderate central intrahepatic bile duct dilation. There is abrupt narrowing/cutoff of the proximal extrahepatic bile duct. The mid/distal extrahepatic bile duct is nondilated. There is ill-defined peripheral irregular wall enhancing mass in the caudate lobe which is likely the cause of abrupt cutoff of the central biliary tree. Findings may be due to primary hilar cholangiocarcinoma versus metastases. The middle and right intrahepatic veins are patent. Left hepatic vein is not well seen, likely thrombosed. There is moderate narrowing in the midportion of main portal vein (series 19, image 31). Right portal vein and its major tributaries are patent. However, there is nonvisualization of left portal vein, suggesting thrombosis. These findings are new since the prior study from 12/14/2023. Pancreas: No mass, inflammatory changes or other parenchymal abnormality identified. No main pancreatic duct dilation. Spleen: Top-normal spleen measuring upto 5.5 x 12.9 orthogonally on coronal plane. No focal mass. Adrenals/Urinary Tract: Bilateral adrenal adenomas noted measuring 1.1 x 1.5 cm on the left side and 1.0 x 1.5 cm on the right side. No hydroureteronephrosis. No suspicious renal mass. Stomach/Bowel: Visualized portions within the abdomen are unremarkable. No disproportionate dilation of bowel loops.  Vascular/Lymphatic: No pathologically enlarged lymph nodes identified. No abdominal aortic aneurysm demonstrated. No ascites. Redemonstration of multiple nodular implants throughout the abdomen, compatible with peritoneal carcinomatosis. Overall, there is mild interval increase in  the size, suggesting worsening peritoneal carcinomatosis. Other: Susceptibility artifact from thoracolumbar spine spinal fixation hardware noted. Musculoskeletal: No suspicious bone lesions identified. IMPRESSION: 1. There is redemonstration of heterogeneous contracted gallbladder with ill-defined heterogeneous peripherally irregular wall enhancing lesion in the fundus of the gallbladder which appears to infiltrate into the left hepatic lobe, segment 4B, suggesting infiltrating tumor. 2. There are multiple (around 10), T2 hyperintense, peripherally rim enhancing target like lesions throughout liver, which have increased in size and number since the prior study and compatible with worsening metastatic disease. 3. There is mild-to-moderate central intrahepatic bile duct dilation. There is abrupt narrowing/cutoff of the proximal extrahepatic bile duct. The mid/distal extrahepatic bile duct is nondilated. There is ill-defined peripheral irregular wall enhancing mass in the caudate lobe which is likely the cause of abrupt cutoff of the central biliary tree. Findings may be due to primary hilar cholangiocarcinoma versus metastases. 4. There is moderate narrowing in the midportion of main portal vein. Right portal vein and its major tributaries are patent. However, there is nonvisualization of left portal vein, suggesting thrombosis. There is also new nonvisualization of left hepatic vein. These findings are new since the prior study from 12/14/2023. 5. Bilateral adrenal adenomas. Electronically Signed   By: Ree Molt M.D.   On: 05/13/2024 13:26   US  Abdomen Limited RUQ (LIVER/GB) Result Date: 05/11/2024 EXAM: Right Upper Quadrant  Abdominal Ultrasound 05/11/2024 06:56:14 PM CLINICAL HISTORY: Abnormal transaminases. TECHNIQUE: Real-time ultrasonography of the right upper quadrant of the abdomen was performed. COMPARISON: US  Abdomen Limited 12/14/2023. CT abdomen pelvis 08/24/2023. FINDINGS: LIVER: Nodular hepatic contour. Coarsened echogenicity of the hepatic parenchyma. No intrahepatic biliary ductal dilatation. No focal liver lesions identified. BILIARY SYSTEM: Gallbladder wall not visualized. The common bile duct measures 6.0 mm. OTHER: No right upper quadrant ascites. IMPRESSION: 1. Cirrhosis. No focal liver lesions identified. Liver protocol contrast-enhanced MR or CT are most sensitive for HCC screening in cirrhosis. 2. Gallbladder wall not visualized. Electronically signed by: Morgane Naveau MD 05/11/2024 07:06 PM EDT RP Workstation: [id number]   CT ABDOMEN PELVIS W CONTRAST Addendum Date: 05/11/2024 ** ADDENDUM #1 ** ADDENDUM: -- Clinicians worried about biliary obstruction. There is mild central intrahepatic biliary dilatation, slightly increased when compared to the prior CT scan from 12/14/2023. The gallbladder is surgically absent. The cystic and solid appearing process in the gallbladder fossa is likely tumor and is grossly unchanged. The necrotic-appearing periportal nodal disease maybe slightly larger. It measures approximately 3.0x2.8 cm and previously measured 2.9x2.7 cm. There may be slight narrowing of the adjacent main portal vein and there also appears to be significant new stenosis of the left portal vein. MRI/MRCP may be helpful for further evaluation. -------------------------------------------------- Electronically signed by: Maude Stammer MD 05/11/2024 07:07 PM EDT RP Workstation: [id number]   Result Date: 05/11/2024 ** ORIGINAL REPORT ** EXAM: CT ABDOMEN AND PELVIS WITH CONTRAST 05/11/2024 03:50:05 PM TECHNIQUE: CT of the abdomen and pelvis was performed with the administration of 80 mL of iohexol   (OMNIPAQUE ) 300 MG/ML solution. Multiplanar reformatted images are provided for review. Automated exposure control, iterative reconstruction, and/or weight-based adjustment of the mA/kV was utilized to reduce the radiation dose to as low as reasonably achievable. COMPARISON: PET CT 03/21/2024 and CT abdomen/pelvis 12/14/2023. CLINICAL HISTORY: Bowel obstruction suspected; LLQ abdominal pain. FINDINGS: LOWER CHEST: Persistent/stable pulmonary metastatic disease. No pleural or pericardial effusion. Stable vascular calcifications. LIVER: Hepatic metastatic disease. Suspect new hepatic lesions in both hepatic lobes. 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  Persistent mild intrahepatic biliary dilatation. Stable necrotic-appearing periportal adenopathy. GALLBLADDER AND BILE DUCTS: Surgically absent. No biliary ductal dilatation. SPLEEN: No splenic lesions. PANCREAS: The pancreas is unremarkable and stable. ADRENAL GLANDS: Bilateral adrenal gland nodules are stable. KIDNEYS, URETERS AND BLADDER: No renal lesions. The bladder is normal. No stones in the kidneys or ureters. No hydronephrosis. No perinephric or periureteral stranding. GI AND BOWEL: The stomach, duodenum, small bowel, and colon are unremarkable. No findings suspicious for bowel obstruction. Moderate stool burden. PERITONEUM AND RETROPERITONEUM: No free abdominal or free pelvic fluid. No mesenteric or retroperitoneal mass. Overall stable scattered omental implants near the liver and near the transverse colon. VASCULATURE: Stable advanced atherosclerotic calcifications involving the aorta and branch vessels but no aneurysm or dissection. The major venous structures are patent. LYMPH NODES: No lymphadenopathy. REPRODUCTIVE ORGANS: BONES AND SOFT TISSUES: The bony structures are intact. Extensive surgical changes involving the THORACIC and LUMBAR spines without obvious complicating features. Diffuse osteoporosis. No  focal soft tissue abnormality. No inguinal mass or adenopathy. IMPRESSION: 1. No CT evidence of bowel obstruction. 2. Hepatic metastatic disease with suspected new hepatic lesions in both hepatic lobes. 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 3. Stable scattered omental implants near the liver and near the transverse colon. 4. Stable cystic and solid mass in the central pelvis measuring 4.8 cm. Electronically signed by: Maude Stammer MD 05/11/2024 04:38 PM EDT RP Workstation: [id number]

## 2024-05-18 NOTE — Procedures (Addendum)
 Interventional Radiology Procedure:   Indications: Gallbladder cancer with jaundice and biliary obstruction  Procedure: 1) Paracentesis 2) Percutaneous transhepatic cholangiogram 3) Placement of internal/external biliary drain  Findings: Perihepatic ascites and paracentesis yielded 280 ml of amber fluid. Minimal peripheral bile duct dilatation.  Successful cannulation of a bile duct in right hepatic lobe.  Central biliary obstruction was traversed with catheter and wire.  8.5 Fr biliary drain advanced into duodenum.     Complications: None     EBL:  Minimal   Plan:  Follow drain output and liver labs.    Stephen Baruch R. Philip, MD  Pager: [phone]

## 2024-05-18 NOTE — Plan of Care (Signed)

## 2024-05-18 NOTE — Progress Notes (Signed)
 Mobility Specialist Progress Note:   05/18/24 1420  Mobility  Activity Ambulated with assistance  Level of Assistance Minimal assist, patient does 75% or more  Assistive Device Front wheel walker  Distance Ambulated (ft) 125 ft  Activity Response Tolerated well  Mobility Referral Yes  Mobility visit 1 Mobility  Mobility Specialist Start Time (ACUTE ONLY) 1407  Mobility Specialist Stop Time (ACUTE ONLY) 1418  Mobility Specialist Time Calculation (min) (ACUTE ONLY) 11 min   Pt was received in bed and agreed to mobility. Min A during ambulation. Returned with all needs met. Call bell in reach.  [employer] - Mobility Specialist

## 2024-05-19 ENCOUNTER — Encounter: Payer: Self-pay | Admitting: Oncology

## 2024-05-19 ENCOUNTER — Other Ambulatory Visit (HOSPITAL_COMMUNITY): Payer: Self-pay

## 2024-05-19 ENCOUNTER — Telehealth (HOSPITAL_COMMUNITY): Payer: Self-pay | Admitting: Pharmacy Technician

## 2024-05-19 DIAGNOSIS — C801 Malignant (primary) neoplasm, unspecified: Secondary | ICD-10-CM | POA: Diagnosis not present

## 2024-05-19 DIAGNOSIS — K831 Obstruction of bile duct: Secondary | ICD-10-CM | POA: Diagnosis not present

## 2024-05-19 DIAGNOSIS — R109 Unspecified abdominal pain: Secondary | ICD-10-CM | POA: Diagnosis not present

## 2024-05-19 DIAGNOSIS — C23 Malignant neoplasm of gallbladder: Secondary | ICD-10-CM | POA: Diagnosis not present

## 2024-05-19 DIAGNOSIS — R7989 Other specified abnormal findings of blood chemistry: Secondary | ICD-10-CM | POA: Diagnosis not present

## 2024-05-19 DIAGNOSIS — R935 Abnormal findings on diagnostic imaging of other abdominal regions, including retroperitoneum: Secondary | ICD-10-CM | POA: Diagnosis not present

## 2024-05-19 LAB — CBC
HCT: 23.4 % — ABNORMAL LOW (ref 36.0–46.0)
Hemoglobin: 7.5 g/dL — ABNORMAL LOW (ref 12.0–15.0)
MCH: 33.5 pg (ref 26.0–34.0)
MCHC: 32.1 g/dL (ref 30.0–36.0)
MCV: 104.5 fL — ABNORMAL HIGH (ref 80.0–100.0)
Platelets: 113 K/uL — ABNORMAL LOW (ref 150–400)
RBC: 2.24 MIL/uL — ABNORMAL LOW (ref 3.87–5.11)
RDW: 22.1 % — ABNORMAL HIGH (ref 11.5–15.5)
WBC: 6.3 K/uL (ref 4.0–10.5)
nRBC: 0 % (ref 0.0–0.2)

## 2024-05-19 LAB — COMPREHENSIVE METABOLIC PANEL WITH GFR
ALT: 70 U/L — ABNORMAL HIGH (ref 0–44)
AST: 69 U/L — ABNORMAL HIGH (ref 15–41)
Albumin: 2.8 g/dL — ABNORMAL LOW (ref 3.5–5.0)
Alkaline Phosphatase: 223 U/L — ABNORMAL HIGH (ref 38–126)
Anion gap: 9 (ref 5–15)
BUN: 12 mg/dL (ref 8–23)
CO2: 21 mmol/L — ABNORMAL LOW (ref 22–32)
Calcium: 8.7 mg/dL — ABNORMAL LOW (ref 8.9–10.3)
Chloride: 107 mmol/L (ref 98–111)
Creatinine, Ser: 0.8 mg/dL (ref 0.44–1.00)
GFR, Estimated: >60 mL/min (ref >60)
Glucose, Bld: 110 mg/dL — ABNORMAL HIGH (ref 70–99)
Potassium: 3.7 mmol/L (ref 3.5–5.1)
Sodium: 137 mmol/L (ref 135–145)
Total Bilirubin: 7.9 mg/dL — ABNORMAL HIGH (ref 0.0–1.2)
Total Protein: 5.3 g/dL — ABNORMAL LOW (ref 6.5–8.1)

## 2024-05-19 MED ORDER — APIXABAN 2.5 MG PO TABS
2.5000 mg | ORAL_TABLET | Freq: Two times a day (BID) | ORAL | Status: DC
  Administered 2024-05-19 – 2024-05-21 (×5): 2.5 mg via ORAL
  Filled 2024-05-19 (×5): qty 1

## 2024-05-19 NOTE — Telephone Encounter (Signed)
 Patient Product/process Development Scientist completed.    The patient is insured through [employer] and [employer].     Ran test claim for Eliquis 5 mg and the current 30 day co-pay is $0.00.  Ran test claim for Xarelto 20 mg and the current 30 day co-pay is $0.00.  This test claim was processed through [facility]- copay amounts may vary at other pharmacies due to pharmacy/plan contracts, or as the patient moves through the different stages of their insurance plan.     Reyes Sharps, CPHT Pharmacy Technician Patient Advocate Specialist [facility] Patient Advocate Team Direct Number: [phone]  Fax: [phone]

## 2024-05-19 NOTE — Progress Notes (Signed)
 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 - The disease burden is causing biliary obstruction and jaundice. - GI has evaluated the patient and because of the location, they cannot help with stenting/intervention.  Recommended IR evaluation for external biliary drain placement. -IR attempted external biliary drain placement on 05/14/2024 but it was not successful.   - IR performed external and internal biliary drainage on 05/18/2024. - Bilirubin slightly better compared to prior.  Will continue to monitor the trend. - Previously I discussed MRI findings with the patient and her family members.  Explained unfortunate disease progression.  Discussed need to switch systemic treatments however her bilirubin has to return to close to normal for her to be a candidate for further systemic treatments. - On 05/16/2024, I introduced the concept of hospice, if her clinical situation worsens or if she has progressive jaundice, which precludes systemic chemotherapy.  Patient verbalized understanding.  She would like to take 1 day at a time.  Left portal vein thrombus - Portal vein thrombosis as noted on MRI/MRCP.  Patient has had anemia which needed PRBC transfusions recently.  There is concern for occult blood loss.  Hence we will proceed with prophylactic dose of Eliquis at 2.5 mg p.o. twice daily instead of full-strength anticoagulation.  Patient was advised to watch for any signs of bleeding and we will discontinue anticoagulation in such an instance. -  Continue to monitor hemoglobin daily.   Obstructive jaundice due to malignant biliary obstruction Obstructive jaundice secondary to malignant biliary obstruction from gallbladder cancer, with elevated bilirubin levels limiting chemotherapy options. Interventional radiology is considering another attempt to place a drain to alleviate obstruction and reduce bilirubin levels. - Consulted interventional radiology for potential external biliary drain placement.  She underwent successful external and internal biliary drain placement on 05/18/2024 by IR.  We greatly appreciate their help. - Monitor bilirubin levels and liver function tests.   Will continue to follow.  Please call us  with any questions.   Chinita Patten, MD 05/19/2024 1:46 PM  ONCOLOGY HISTORY:    78 y.o. lady with past medical history of essential hypertension, osteoarthritis, COPD/asthma, GERD, depression, anxiety, hiatal hernia, chronic back pain presented to the ED on 12/14/2023 with complaints of worsening epigastric/abdominal pain over 2-3 weeks.  Routine blood work was unremarkable in the ER.  However CT abdomen and pelvis showed irregular appearance of the gallbladder with suggestion of capsular thickening along the fundus of the gallbladder.  Gallbladder appeared inseparable from the adjacent portion of the liver.  Clinical picture was concerning for gallbladder carcinoma.  Mesenteric nodular thickening along the mid lower abdominal cavity indicating peritoneal carcinomatosis.  Soft tissue nodular mass in the left lower quadrant region of the pelvis measuring 3.6 x 2.7 cm, likely ovarian neoplastic lesion.  Multiple lung nodules in the lower lobes bilaterally, largest measuring 12 mm in the right lower lobe, concerning for pulmonary metastatic disease.   She was admitted to the hospital for further evaluation and management.  IR was consulted for peritoneal mass biopsy.  CA 19-9 was significant elevated at 14,368.  CEA was  increased  at 98.7, CA-125 was also increased at 91.3.   On 12/14/2023, MRI abdomen/MRCP showed thickened, contracted gallbladder with a heterogeneous mass arising from the gallbladder fundus, contiguous with the adjacent liver parenchyma of hepatic segment IVB measuring 3.5 x 2.8 cm. Multiple small rim enhancing lesions within the liver parenchyma. Enlarged, necrotic appearing portacaval and porta hepatis lymph nodes. Small, although rim enhancing and abnormal appearing retroperitoneal lymph nodes. Peritoneal stranding and nodularity, incompletely visualized on this examination although present in the left upper quadrant, consistent with peritoneal metastatic disease. Numerous small bilateral pulmonary nodules, better assessed by CT. 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   On 12/15/2023, she underwent CT-guided biopsy of the omental lesion.  Pathology showed metastatic well to moderately differentiated adenocarcinoma. The tumor is positive for cytokeratin 7 and shows focal weak positivity for the GI marker CDX2.  The tumor is negative for the GI markers cytokeratin 20.  The tumor is also negative for the GU and GYN marker PAX8.  The tumor is negative for the pulmonary adeno marker TTF-1. This immunohistochemical pattern and histomorphology would be compatible with the clinical suspicion of a gallbladder primary.  The differential diagnosis would also include an upper GI or pancreaticobiliary primary.    Patient presented to our clinic on 12/24/2023 to establish care with us .  Request placed for staging PET scan.  NGS panel testing requested on the specimen and also liquid biopsy obtained.  It showed no actionable mutations.   Staging PET scan on 12/29/2023 showed known gallbladder mass extending into the liver.  Peritoneal carcinomatosis noted.  Numerous lung nodules were identified, small bowel with FDG uptake, concerning for metastatic disease in the lungs.   Metastatic lymphadenopathy was noted in the abdomen and retroperitoneum.  This scan will serve as a good baseline for future compression.    Plan for palliative systemic chemoimmunotherapy with cisplatin , gemcitabine , durvalumab . Scheduled to start C1D1 from 01/01/24.    Restaging PET scan on 03/21/2024 showed overall improvement in disease.  She was tolerating treatments well up until early October 2025 when she started having some swallowing difficulty, clinical picture concerning for esophageal candidiasis and a skin rash which led to a break in systemic treatments   She presented to the ED on 05/11/2024 with complaints of abdominal pain and possible obstipation.  CT abdomen and pelvis on 05/11/2024 showed suspected new hepatic lesions in both hepatic lobes, mild central intrahepatic biliary dilatation.  Necrotic appearing periportal nodal disease may be slightly larger at 3 x 2.8 cm, compared to 2.9 x 2.7 cm previously.  There may be slight narrowing of the adjacent main portal vein and there also appears to be significant new stenosis of the left portal vein.  MRI/MRCP would be helpful for further evaluation.   Bilirubin has been slowly trending upwards.  Previously normal at 0.5 when we saw her in clinic.  It was 3.9 on arrival to ED.  Alkaline phosphatase, AST ALT were also elevated.  On 05/13/2024, MRI/MRCP showed evidence of disease progression with multiple new lesions throughout the liver which are increased in size and number, compatible with worsening metastatic disease.  GI consulted.  They could not do intervention because of the location of biliary obstruction.  On 05/14/2024, IR attempted external biliary drainage but it was not successful.  Plan for IR reevaluation on 05/17/2024 or 05/18/2024.  SUBJECTIVE:   She reports post-procedural pain and nausea following a drainage procedure.  She experiences soreness and difficulty moving  after a drainage procedure performed yesterday,  describing the pain as sore rather than sharp.  She has a lack of appetite, stating she has 'not been hungry,' but can eat if she makes herself. She experienced nausea earlier, attributed to acid reflux, but currently has no nausea or vomiting.   OBJECTIVE:  Vitals:   05/19/24 0701 05/19/24 0920  BP: (!) 161/64   Pulse: 83   Resp: 18   Temp: 98 F (36.7 C)   SpO2: 98% 96%     Intake/Output Summary (Last 24 hours) at 05/19/2024 1346 Last data filed at 05/19/2024 1203 Gross per 24 hour  Intake 738.7 ml  Output 950 ml  Net -211.3 ml    Physical Exam Constitutional:      General: She is not in acute distress.    Appearance: Normal appearance.  HENT:     Head: Normocephalic and atraumatic.  Eyes:     General: Scleral icterus present.  Cardiovascular:     Rate and Rhythm: Normal rate.  Pulmonary:     Effort: Pulmonary effort is normal. No respiratory distress.  Abdominal:     General: There is no distension.  Skin:    Coloration: Skin is jaundiced.     Findings: Rash (scattered maculopapular rash, improved) present.  Neurological:     General: No focal deficit present.     Mental Status: She is alert and oriented to person, place, and time.  Psychiatric:        Mood and Affect: Mood normal.        Behavior: Behavior normal.      LABS:   Results for orders placed or performed during the hospital encounter of 05/11/24 (from the past 24 hours)  CBC     Status: Abnormal   Collection Time: 05/19/24  5:20 AM  Result Value Ref Range   WBC 6.3 4.0 - 10.5 K/uL   RBC 2.24 (L) 3.87 - 5.11 MIL/uL   Hemoglobin 7.5 (L) 12.0 - 15.0 g/dL   HCT 76.5 (L) 63.9 - 53.9 %   MCV 104.5 (H) 80.0 - 100.0 fL   MCH 33.5 26.0 - 34.0 pg   MCHC 32.1 30.0 - 36.0 g/dL   RDW 77.8 (H) 88.4 - 84.4 %   Platelets 113 (L) 150 - 400 K/uL   nRBC 0.0 0.0 - 0.2 %  Comprehensive metabolic panel with GFR     Status: Abnormal   Collection Time: 05/19/24  5:20 AM  Result Value Ref Range   Sodium 137  135 - 145 mmol/L   Potassium 3.7 3.5 - 5.1 mmol/L   Chloride 107 98 - 111 mmol/L   CO2 21 (L) 22 - 32 mmol/L   Glucose, Bld 110 (H) 70 - 99 mg/dL   BUN 12 8 - 23 mg/dL   Creatinine, Ser 9.19 0.44 - 1.00 mg/dL   Calcium  8.7 (L) 8.9 - 10.3 mg/dL   Total Protein 5.3 (L) 6.5 - 8.1 g/dL   Albumin  2.8 (L) 3.5 - 5.0 g/dL   AST 69 (H) 15 - 41 U/L   ALT 70 (H) 0 - 44 U/L   Alkaline Phosphatase 223 (H) 38 - 126 U/L   Total Bilirubin 7.9 (H) 0.0 - 1.2 mg/dL   GFR, Estimated >39 >39 mL/min   Anion gap 9 5 - 15      IMAGING STUDIES:   IR BILIARY DRAIN PLACEMENT WITH CHOLANGIOGRAM Result Date: 05/14/2024 INDICATION: 78 year old with metastatic gallbladder carcinoma and found to have progressive disease. Patient  has mild biliary dilatation and elevated bilirubin. Plan for percutaneous transhepatic cholangiogram and attempt biliary drain placement. EXAM: 1. Percutaneous transhepatic cholangiogram using ultrasound and fluoroscopic guidance. 2. Attempted placement of percutaneous biliary drain. MEDICATIONS: Cefoxitin 2 g; The antibiotic was administered within an appropriate time frame prior to the initiation of the procedure. ANESTHESIA/SEDATION: Moderate (conscious) sedation was employed during this procedure. A total of Versed  4 mg and Fentanyl  150 mcg was administered intravenously by the radiology nurse. Total intra-service moderate Sedation Time: 70 minutes. The patient's level of consciousness and vital signs were monitored continuously by radiology nursing throughout the procedure under my direct supervision. FLUOROSCOPY: Radiation Exposure Index (as provided by the fluoroscopic device): 311 mGy Kerma COMPLICATIONS: None immediate. PROCEDURE: Informed written consent was obtained from the patient after a thorough discussion of the procedural risks, benefits and alternatives. All questions were addressed. Maximal Sterile Barrier Technique was utilized including caps, mask, sterile gowns, sterile gloves,  sterile drape, hand hygiene and skin antiseptic. A timeout was performed prior to the initiation of the procedure. Right side of the abdomen was prepped and draped in sterile fashion. Ultrasound identified mild intrahepatic biliary dilatation in the right hepatic lobe. Right side of the abdomen was anesthetized with 1% lidocaine . Using ultrasound guidance, 21 gauge needle was directed into a peripheral bile duct. Contrast injection confirmed placement in the biliary system. Multiple attempts were made to cannulate a peripheral bile duct using 0.018 wires. These attempts to cannulate the biliary system with a wire were unsuccessful. Additional bile ducts were targeted using ultrasound and fluoroscopic guidance but these bile ducts could not be successfully cannulated with a wire. Eventually, the procedure was aborted. Bandage placed at the puncture sites. Fluoroscopic and ultrasound images were taken and saved for documentation. FINDINGS: Ultrasound demonstrated mild intrahepatic biliary dilatation. A peripheral right hepatic bile duct was targeted with ultrasound. A peripheral right hepatic bile duct was successfully opacified with contrast. Mild to moderate dilatation in the central portion of this bile duct. An additional bile duct towards the hepatic dome was also opacified. There is no filling of the left hepatic ducts. Contrast did drain into the common bile duct and duodenum. Lack of contrast at the biliary confluence probably related to obstructing lesions. IMPRESSION: 1. Percutaneous transhepatic cholangiogram demonstrates mild intrahepatic biliary dilatation but incomplete opacification of the biliary system. Findings are suggestive for partial obstructive disease in the central aspect of the biliary system but there is drainage into the extrahepatic bile duct and duodenum. 2. Attempts to place a percutaneous biliary drain were unsuccessful due to the small size of the peripheral bile ducts. Electronically  Signed   By: Juliene Balder M.D.   On: 05/14/2024 17:41   MR ABDOMEN MRCP W WO CONTAST Result Date: 05/13/2024 CLINICAL DATA:  Epigastric pain 885157 Epigastric pain 114842; Epigastric pain. EXAM: MRI ABDOMEN WITHOUT AND WITH CONTRAST (INCLUDING MRCP) TECHNIQUE: Multiplanar multisequence MR imaging of the abdomen was performed both before and after the administration of intravenous contrast. Heavily T2-weighted images of the biliary and pancreatic ducts were obtained, and three-dimensional MRCP images were rendered by post processing. CONTRAST:  7mL GADAVIST  GADOBUTROL  1 MMOL/ML IV SOLN COMPARISON:  Ultrasound abdomen and CT scan abdomen and pelvis from 05/11/2024. FINDINGS: Lower chest: Unremarkable MR appearance to the lung bases. No pleural effusion. No pericardial effusion. Normal heart size. Hepatobiliary: The liver is moderately enlarged in size measuring up to 17.7 cm in length. Noncirrhotic configuration. There are multiple (around 10), T2 hyperintense,, peripherally rim enhancing target  like lesions throughout liver, which have increased in size and number since the prior study and compatible with worsening metastatic disease. There is redemonstration of heterogeneous contracted gallbladder with ill-defined heterogeneous peripherally irregular wall enhancing lesion in the fundus of the gallbladder which appears to infiltrate into the left hepatic lobe, segment 4B, suggesting infiltrating tumor. The lesion measures up to 2.9 x 3.1 cm, grossly similar to the prior study., There is mild-to-moderate central intrahepatic bile duct dilation. There is abrupt narrowing/cutoff of the proximal extrahepatic bile duct. The mid/distal extrahepatic bile duct is nondilated. There is ill-defined peripheral irregular wall enhancing mass in the caudate lobe which is likely the cause of abrupt cutoff of the central biliary tree. Findings may be due to primary hilar cholangiocarcinoma versus metastases. The middle and right  intrahepatic veins are patent. Left hepatic vein is not well seen, likely thrombosed. There is moderate narrowing in the midportion of main portal vein (series 19, image 31). Right portal vein and its major tributaries are patent. However, there is nonvisualization of left portal vein, suggesting thrombosis. These findings are new since the prior study from 12/14/2023. Pancreas: No mass, inflammatory changes or other parenchymal abnormality identified. No main pancreatic duct dilation. Spleen: Top-normal spleen measuring upto 5.5 x 12.9 orthogonally on coronal plane. No focal mass. Adrenals/Urinary Tract: Bilateral adrenal adenomas noted measuring 1.1 x 1.5 cm on the left side and 1.0 x 1.5 cm on the right side. No hydroureteronephrosis. No suspicious renal mass. Stomach/Bowel: Visualized portions within the abdomen are unremarkable. No disproportionate dilation of bowel loops. Vascular/Lymphatic: No pathologically enlarged lymph nodes identified. No abdominal aortic aneurysm demonstrated. No ascites. Redemonstration of multiple nodular implants throughout the abdomen, compatible with peritoneal carcinomatosis. Overall, there is mild interval increase in the size, suggesting worsening peritoneal carcinomatosis. Other: Susceptibility artifact from thoracolumbar spine spinal fixation hardware noted. Musculoskeletal: No suspicious bone lesions identified. IMPRESSION: 1. There is redemonstration of heterogeneous contracted gallbladder with ill-defined heterogeneous peripherally irregular wall enhancing lesion in the fundus of the gallbladder which appears to infiltrate into the left hepatic lobe, segment 4B, suggesting infiltrating tumor. 2. There are multiple (around 10), T2 hyperintense, peripherally rim enhancing target like lesions throughout liver, which have increased in size and number since the prior study and compatible with worsening metastatic disease. 3. There is mild-to-moderate central intrahepatic bile  duct dilation. There is abrupt narrowing/cutoff of the proximal extrahepatic bile duct. The mid/distal extrahepatic bile duct is nondilated. There is ill-defined peripheral irregular wall enhancing mass in the caudate lobe which is likely the cause of abrupt cutoff of the central biliary tree. Findings may be due to primary hilar cholangiocarcinoma versus metastases. 4. There is moderate narrowing in the midportion of main portal vein. Right portal vein and its major tributaries are patent. However, there is nonvisualization of left portal vein, suggesting thrombosis. There is also new nonvisualization of left hepatic vein. These findings are new since the prior study from 12/14/2023. 5. Bilateral adrenal adenomas. Electronically Signed   By: Ree Molt M.D.   On: 05/13/2024 13:26   MR 3D Recon At Scanner Result Date: 05/13/2024 CLINICAL DATA:  Epigastric pain 114842 Epigastric pain 114842; Epigastric pain. EXAM: MRI ABDOMEN WITHOUT AND WITH CONTRAST (INCLUDING MRCP) TECHNIQUE: Multiplanar multisequence MR imaging of the abdomen was performed both before and after the administration of intravenous contrast. Heavily T2-weighted images of the biliary and pancreatic ducts were obtained, and three-dimensional MRCP images were rendered by post processing. CONTRAST:  7mL GADAVIST  GADOBUTROL  1 MMOL/ML IV SOLN  COMPARISON:  Ultrasound abdomen and CT scan abdomen and pelvis from 05/11/2024. FINDINGS: Lower chest: Unremarkable MR appearance to the lung bases. No pleural effusion. No pericardial effusion. Normal heart size. Hepatobiliary: The liver is moderately enlarged in size measuring up to 17.7 cm in length. Noncirrhotic configuration. There are multiple (around 10), T2 hyperintense,, peripherally rim enhancing target like lesions throughout liver, which have increased in size and number since the prior study and compatible with worsening metastatic disease. There is redemonstration of heterogeneous contracted  gallbladder with ill-defined heterogeneous peripherally irregular wall enhancing lesion in the fundus of the gallbladder which appears to infiltrate into the left hepatic lobe, segment 4B, suggesting infiltrating tumor. The lesion measures up to 2.9 x 3.1 cm, grossly similar to the prior study., There is mild-to-moderate central intrahepatic bile duct dilation. There is abrupt narrowing/cutoff of the proximal extrahepatic bile duct. The mid/distal extrahepatic bile duct is nondilated. There is ill-defined peripheral irregular wall enhancing mass in the caudate lobe which is likely the cause of abrupt cutoff of the central biliary tree. Findings may be due to primary hilar cholangiocarcinoma versus metastases. The middle and right intrahepatic veins are patent. Left hepatic vein is not well seen, likely thrombosed. There is moderate narrowing in the midportion of main portal vein (series 19, image 31). Right portal vein and its major tributaries are patent. However, there is nonvisualization of left portal vein, suggesting thrombosis. These findings are new since the prior study from 12/14/2023. Pancreas: No mass, inflammatory changes or other parenchymal abnormality identified. No main pancreatic duct dilation. Spleen: Top-normal spleen measuring upto 5.5 x 12.9 orthogonally on coronal plane. No focal mass. Adrenals/Urinary Tract: Bilateral adrenal adenomas noted measuring 1.1 x 1.5 cm on the left side and 1.0 x 1.5 cm on the right side. No hydroureteronephrosis. No suspicious renal mass. Stomach/Bowel: Visualized portions within the abdomen are unremarkable. No disproportionate dilation of bowel loops. Vascular/Lymphatic: No pathologically enlarged lymph nodes identified. No abdominal aortic aneurysm demonstrated. No ascites. Redemonstration of multiple nodular implants throughout the abdomen, compatible with peritoneal carcinomatosis. Overall, there is mild interval increase in the size, suggesting worsening  peritoneal carcinomatosis. Other: Susceptibility artifact from thoracolumbar spine spinal fixation hardware noted. Musculoskeletal: No suspicious bone lesions identified. IMPRESSION: 1. There is redemonstration of heterogeneous contracted gallbladder with ill-defined heterogeneous peripherally irregular wall enhancing lesion in the fundus of the gallbladder which appears to infiltrate into the left hepatic lobe, segment 4B, suggesting infiltrating tumor. 2. There are multiple (around 10), T2 hyperintense, peripherally rim enhancing target like lesions throughout liver, which have increased in size and number since the prior study and compatible with worsening metastatic disease. 3. There is mild-to-moderate central intrahepatic bile duct dilation. There is abrupt narrowing/cutoff of the proximal extrahepatic bile duct. The mid/distal extrahepatic bile duct is nondilated. There is ill-defined peripheral irregular wall enhancing mass in the caudate lobe which is likely the cause of abrupt cutoff of the central biliary tree. Findings may be due to primary hilar cholangiocarcinoma versus metastases. 4. There is moderate narrowing in the midportion of main portal vein. Right portal vein and its major tributaries are patent. However, there is nonvisualization of left portal vein, suggesting thrombosis. There is also new nonvisualization of left hepatic vein. These findings are new since the prior study from 12/14/2023. 5. Bilateral adrenal adenomas. Electronically Signed   By: Ree Molt M.D.   On: 05/13/2024 13:26   US  Abdomen Limited RUQ (LIVER/GB) Result Date: 05/11/2024 EXAM: Right Upper Quadrant Abdominal Ultrasound 05/11/2024 06:56:14 PM  CLINICAL HISTORY: Abnormal transaminases. TECHNIQUE: Real-time ultrasonography of the right upper quadrant of the abdomen was performed. COMPARISON: US  Abdomen Limited 12/14/2023. CT abdomen pelvis 08/24/2023. FINDINGS: LIVER: Nodular hepatic contour. Coarsened echogenicity  of the hepatic parenchyma. No intrahepatic biliary ductal dilatation. No focal liver lesions identified. BILIARY SYSTEM: Gallbladder wall not visualized. The common bile duct measures 6.0 mm. OTHER: No right upper quadrant ascites. IMPRESSION: 1. Cirrhosis. No focal liver lesions identified. Liver protocol contrast-enhanced MR or CT are most sensitive for HCC screening in cirrhosis. 2. Gallbladder wall not visualized. Electronically signed by: Morgane Naveau MD 05/11/2024 07:06 PM EDT RP Workstation: [id number]   CT ABDOMEN PELVIS W CONTRAST Addendum Date: 05/11/2024 ** ADDENDUM #1 ** ADDENDUM: -- Clinicians worried about biliary obstruction. There is mild central intrahepatic biliary dilatation, slightly increased when compared to the prior CT scan from 12/14/2023. The gallbladder is surgically absent. The cystic and solid appearing process in the gallbladder fossa is likely tumor and is grossly unchanged. The necrotic-appearing periportal nodal disease maybe slightly larger. It measures approximately 3.0x2.8 cm and previously measured 2.9x2.7 cm. There may be slight narrowing of the adjacent main portal vein and there also appears to be significant new stenosis of the left portal vein. MRI/MRCP may be helpful for further evaluation. -------------------------------------------------- Electronically signed by: Maude Stammer MD 05/11/2024 07:07 PM EDT RP Workstation: [id number]   Result Date: 05/11/2024 ** ORIGINAL REPORT ** EXAM: CT ABDOMEN AND PELVIS WITH CONTRAST 05/11/2024 03:50:05 PM TECHNIQUE: CT of the abdomen and pelvis was performed with the administration of 80 mL of iohexol  (OMNIPAQUE ) 300 MG/ML solution. Multiplanar reformatted images are provided for review. Automated exposure control, iterative reconstruction, and/or weight-based adjustment of the mA/kV was utilized to reduce the radiation dose to as low as reasonably achievable. COMPARISON: PET CT 03/21/2024 and CT abdomen/pelvis 12/14/2023.  CLINICAL HISTORY: Bowel obstruction suspected; LLQ abdominal pain. FINDINGS: LOWER CHEST: Persistent/stable pulmonary metastatic disease. No pleural or pericardial effusion. Stable vascular calcifications. LIVER: Hepatic metastatic disease. Suspect new hepatic lesions in both hepatic lobes. 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 Persistent mild intrahepatic biliary dilatation. Stable necrotic-appearing periportal adenopathy. GALLBLADDER AND BILE DUCTS: Surgically absent. No biliary ductal dilatation. SPLEEN: No splenic lesions. PANCREAS: The pancreas is unremarkable and stable. ADRENAL GLANDS: Bilateral adrenal gland nodules are stable. KIDNEYS, URETERS AND BLADDER: No renal lesions. The bladder is normal. No stones in the kidneys or ureters. No hydronephrosis. No perinephric or periureteral stranding. GI AND BOWEL: The stomach, duodenum, small bowel, and colon are unremarkable. No findings suspicious for bowel obstruction. Moderate stool burden. PERITONEUM AND RETROPERITONEUM: No free abdominal or free pelvic fluid. No mesenteric or retroperitoneal mass. Overall stable scattered omental implants near the liver and near the transverse colon. VASCULATURE: Stable advanced atherosclerotic calcifications involving the aorta and branch vessels but no aneurysm or dissection. The major venous structures are patent. LYMPH NODES: No lymphadenopathy. REPRODUCTIVE ORGANS: BONES AND SOFT TISSUES: The bony structures are intact. Extensive surgical changes involving the THORACIC and LUMBAR spines without obvious complicating features. Diffuse osteoporosis. No focal soft tissue abnormality. No inguinal mass or adenopathy. IMPRESSION: 1. No CT evidence of bowel obstruction. 2. Hepatic metastatic disease with suspected new hepatic lesions in both hepatic lobes. 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 3. Stable scattered omental implants  near the liver and near the transverse colon. 4. Stable cystic and solid mass in the central pelvis measuring 4.8 cm. Electronically signed by: Maude Stammer  MD 05/11/2024 04:38 PM EDT RP Workstation: [id number]

## 2024-05-19 NOTE — Discharge Instructions (Signed)
Information on my medicine - ELIQUIS (apixaban)  This medication education was reviewed with me or my healthcare representative as part of my discharge preparation.  Why was Eliquis prescribed for you? Eliquis was prescribed to treat blood clots that may have been found in the veins of your legs (deep vein thrombosis) or in your lungs (pulmonary embolism) and to reduce the risk of them occurring again.  What do You need to know about Eliquis ? Your dose is ONE 2.5 mg tablet taken TWICE daily.  Eliquis may be taken with or without food.   Try to take the dose about the same time in the morning and in the evening. If you have difficulty swallowing the tablet whole please discuss with your pharmacist how to take the medication safely.  Take Eliquis exactly as prescribed and DO NOT stop taking Eliquis without talking to the doctor who prescribed the medication.  Stopping may increase your risk of developing a new blood clot.  Refill your prescription before you run out.  After discharge, you should have regular check-up appointments with your healthcare provider that is prescribing your Eliquis.    What do you do if you miss a dose? If a dose of ELIQUIS is not taken at the scheduled time, take it as soon as possible on the same day and twice-daily administration should be resumed. The dose should not be doubled to make up for a missed dose.  Important Safety Information A possible side effect of Eliquis is bleeding. You should call your healthcare provider right away if you experience any of the following: ? Bleeding from an injury or your nose that does not stop. ? Unusual colored urine (red or dark brown) or unusual colored stools (red or black). ? Unusual bruising for unknown reasons. ? A serious fall or if you hit your head (even if there is no bleeding).  Some medicines may interact with Eliquis and might increase your risk of bleeding or clotting while on Eliquis. To help  avoid this, consult your healthcare provider or pharmacist prior to using any new prescription or non-prescription medications, including herbals, vitamins, non-steroidal anti-inflammatory drugs (NSAIDs) and supplements.  This website has more information on Eliquis (apixaban): http://[email or web address].com/eliquis/home

## 2024-05-19 NOTE — Progress Notes (Addendum)
 PROGRESS NOTE    MEKISHA BITTEL  FMW:[id number] DOB: 02/17/1946 DOA: 05/11/2024 PCP: Gayle Saddie FALCON, PA-C    Brief Narrative:   Janice Pace is a 78 y.o. 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  Pain reported as a cramping sensation, difficulty with urination and bowel movement.  In the ED, temperature 98.6 F, HR 80, RR 18, BP 171/100, SpO2 100% on room air.  WBC 6.3, hemoglobin 9.3, platelet count 125.  Sodium 141, Tessman 3.3, chloride 106, CO2 23, glucose 95, BUN 16, creat 1.36.  AST 116, ALT 128, total bilirubin 2.8.  Urinalysis unrevealing.  CT abdomen/pelvis with contrast with no evidence of bowel obstruction, noted hepatic metastatic disease with suspected new hepatic lesions in both lobes, stable changes to gallbladder cancer with involvement of left hepatic lobe, scattered omental implants near liver and transverse colon, stable cystic solid mass central pelvis measuring 4.8 cm.  TRH consulted for admission for further evaluation management of suspected obstructive jaundice with imaging evidence of metastatic disease progression.  Assessment & Plan:   Obstructive jaundice Stage IV gallbladder carcinoma with peritoneal, lung, hepatic metastasis; progressive Patient presenting to the ED with abdominal pain.  CT abdomen/pelvis, MRCP with findings of worsening disease progression with multiple new liver lesions.  Suspect disease burden likely causing biliary obstruction and jaundice.  Evaluated by gastroenterology, due to location unable to assist with stenting or intervention and recommended IR for external biliary drain placement.  Seen by IR on 05/14/2024, unfortunately not successful.  Patient underwent reattempt of biliary drain placement by IR on 05/18/2024 which  was successful with both a external and internal biliary drain placed. -- Medical oncology, Dr. Autumn following; appreciate assistance -- IR following, planning reattempt at external biliary drain placement today, n.p.o. -- AST 116>>79>>75>73>69 -- ALT 128>>114>88>81>70 -- Tbili 2.8>>8.8>6.7>8.4>7.9 -- Continue to monitor external biliary drain output, 250 mL past 24 hours -- Repeat CMP in a.m.  Left portal vein thrombosis Discussed with medical oncology, Dr. Autumn; recommending start low-dose Eliquis given anemia. -- Eliquis 2.5 m p.o. twice daily  Pain of malignancy -- MS Contin  50 mg p.o. every 12 hours -- Oxycodone  10-50 mg p.o. every 4 hours.  Moderate pain -- Morphine  2 mg IV every 2 hours as needed severe pain -- Robaxin  500 mg IV every 6 hours as needed muscle spasms  Constipation -- Senna twice daily -- Lactulose 20 g p.o. twice daily -- MiraLAX  twice daily -- Bisacodyl  suppository 10 mg PR daily as needed moderate constipation  Urinary retention -- Foley catheter placed  HTN -- Amlodipine 5 mg p.o. daily -- Hold home losartan and spironolactone  for now  COPD Asthma -- Breztri  2 puffs twice daily -- Adderall neb every 2 hours.  Wheezing/shortness of breath  GERD -- Protonix  40 mg p.o. twice daily (substituted for home omeprazole )  HLD -- Hold home Crestor  for now given elevated LFTs  Prediabetes Hemoglobin A1c 5.6 on 12/22/2023  DVT prophylaxis: SCDs Start: 05/11/24 1814    Code Status: Limited: Do not attempt resuscitation (DNR) -DNR-LIMITED -Do Not Intubate/DNI  Family Communication: Updated family present at bedside this morning  Disposition Plan:  Level of care: Med-Surg Status is: Inpatient Remains inpatient appropriate because: Monitoring of LFTs, further recommendations for medical oncology, anticipate discharge home likely tomorrow    Consultants:  [facility],  Dr. Charlanne Interventional radiology Medical oncology, Dr. Autumn  Procedures:   Attempted PTC with drain placement, IR 11/1 External/internal biliary drain placement, IR 11/5  Antimicrobials:  Perioperative cefoxitin 11/1, 11/5   Subjective: Patient seen examined bedside, lying in bed.  Family present.  Complaining of some mild irritation surrounding drain placement site.  Discussed improvement in LFTs, total bilirubin this morning.  Will monitor at least 1 more day to ensure continued downtrend of numbers.  Will also reach out to medical oncology regarding left portal vein thrombosis on imaging and need to initiate DOAC.  No specific complaints this morning.  Denies headache, no dizziness, no chest pain, no shortness of breath, no abdominal pain, no fever/chills/night sweats, no nausea/vomiting/diarrhea, no focal weakness, no fatigue, no paresthesias.  No acute events overnight per nurse staff.  Objective: Vitals:   05/18/24 2053 05/18/24 2150 05/19/24 0701 05/19/24 0920  BP: (!) 153/70  (!) 161/64   Pulse: 86  83   Resp: 18  18   Temp: 98.4 F (36.9 C)  98 F (36.7 C)   TempSrc:      SpO2: 98% 98% 98% 96%  Weight:      Height:        Intake/Output Summary (Last 24 hours) at 05/19/2024 1038 Last data filed at 05/19/2024 0534 Gross per 24 hour  Intake 733.7 ml  Output 1150 ml  Net -416.3 ml   Filed Weights   05/11/24 2101  Weight: 72.1 kg    Examination:  Physical Exam: GEN: NAD, alert and oriented x 3, + diffuse jaundice HEENT: NCAT, PERRL, EOMI, sclera clear, MMM PULM: CTAB w/o wheezes/crackles, normal respiratory effort, on room air CV: RRR w/o M/G/R GI: abd soft, NTND, NABS, no R/G/M, noted right sided abdominal drain in place with bilious fluid in collection bag GU: Noted Foley catheter in place MSK: no peripheral edema, moves all extremities independently NEURO: No focal neurologic deficit PSYCH: normal mood/affect Integumentary: + Diffuse jaundice, otherwise no other concerning rashes/lesions/wounds noted exposed skin surfaces    Data  Reviewed: I have personally reviewed following labs and imaging studies  CBC: Recent Labs  Lab 05/14/24 0420 05/15/24 0441 05/16/24 0505 05/17/24 0759 05/19/24 0520  WBC 7.8 7.7 6.8 7.1 6.3  HGB 8.5* 8.5* 7.7* 8.0* 7.5*  HCT 26.7* 26.7* 24.0* 24.9* 23.4*  MCV 100.8* 101.1* 100.8* 102.0* 104.5*  PLT 110* 125* 108* 124* 113*   Basic Metabolic Panel: Recent Labs  Lab 05/14/24 0420 05/15/24 0441 05/16/24 0505 05/17/24 0759 05/19/24 0520  NA 138 136 137 137 137  K 3.9 3.6 4.0 4.2 3.7  CL 105 105 105 104 107  CO2 23 22 23 22  21*  GLUCOSE 108* 123* 111* 121* 110*  BUN 11 10 9 9 12   CREATININE 0.81 0.82 0.73 0.79 0.80  CALCIUM  8.3* 8.5* 8.5* 8.5* 8.7*   GFR: Estimated Creatinine Clearance: 54.8 mL/min (by C-G formula based on SCr of 0.8 mg/dL). Liver Function Tests: Recent Labs  Lab 05/14/24 0420 05/15/24 0441 05/16/24 0505 05/17/24 0759 05/19/24 0520  AST 79* 102* 75* 73* 69*  ALT 103* 114* 88* 81* 70*  ALKPHOS 191* 217* 206* 227* 223*  BILITOT 6.8* 8.8* 6.7* 8.4* 7.9*  PROT 5.2* 5.3* 5.1* 5.5* 5.3*  ALBUMIN  3.1* 3.1* 2.9* 2.9* 2.8*   No results for input(s): LIPASE, AMYLASE in the last 168 hours. No results for input(s): AMMONIA in the last 168 hours. Coagulation Profile: Recent Labs  Lab 05/14/24 0420  INR 1.2   Cardiac  Enzymes: No results for input(s): CKTOTAL, CKMB, CKMBINDEX, TROPONINI in the last 168 hours. BNP (last 3 results) No results for input(s): PROBNP in the last 8760 hours. HbA1C: No results for input(s): HGBA1C in the last 72 hours. CBG: No results for input(s): GLUCAP in the last 168 hours. Lipid Profile: No results for input(s): CHOL, HDL, LDLCALC, TRIG, CHOLHDL, LDLDIRECT in the last 72 hours. Thyroid  Function Tests: No results for input(s): TSH, T4TOTAL, FREET4, T3FREE, THYROIDAB in the last 72 hours. Anemia Panel: No results for input(s): VITAMINB12, FOLATE, FERRITIN, TIBC, IRON,  RETICCTPCT in the last 72 hours. Sepsis Labs: No results for input(s): PROCALCITON, LATICACIDVEN in the last 168 hours.  No results found for this or any previous visit (from the past 240 hours).       Radiology Studies: IR BILIARY DRAIN PLACEMENT WITH CHOLANGIOGRAM Result Date: 05/18/2024 INDICATION: 78 year old with gallbladder cancer, jaundice and elevated bilirubin. Attempted percutaneous biliary drain placement on 05/14/2024 was unsuccessful. Plan to re-attempt biliary drain placement. EXAM: 1. Percutaneous transhepatic cholangiogram using ultrasound and fluoroscopic guidance 2. Placement of internal/external biliary drain 3. Ultrasound-guided paracentesis MEDICATIONS: Cefoxitin 2 g; The antibiotic was administered within an appropriate time frame prior to the initiation of the procedure. ANESTHESIA/SEDATION: Moderate (conscious) sedation was employed during this procedure. A total of Versed  8 mg and Fentanyl  200 mcg and Dilaudid  1 mg was administered intravenously by the radiology nurse. Total intra-service moderate Sedation Time: 70 minutes. The patient's level of consciousness and vital signs were monitored continuously by radiology nursing throughout the procedure under my direct supervision. FLUOROSCOPY: Radiation Exposure Index (as provided by the fluoroscopic device): 169 mGy Kerma CONTRAST:  30 mL Omnipaque  300 COMPLICATIONS: None immediate. PROCEDURE: Informed written consent was obtained from the patient after a thorough discussion of the procedural risks, benefits and alternatives. All questions were addressed. A timeout was performed prior to the initiation of the procedure. The anterior and right side of the abdomen were prepped and draped in sterile fashion. Maximal barrier sterile technique was utilized including caps, mask, sterile gowns, sterile gloves, sterile drape, hand hygiene and skin antiseptic. Ultrasound demonstrated a small amount of perihepatic ascites that was new.  Skin was anesthetized with 1% lidocaine . Using ultrasound guidance, a Yueh catheter was directed in the perihepatic ascites. Paracentesis was performed and 280 mL of amber fluid was removed. Yueh catheter was removed. Multiple attempts were made to cannulate a biliary duct in both the left and right hepatic lobes using ultrasound and fluoroscopic guidance. Procedure was technically difficult due to the minimal intrahepatic biliary dilatation. Eventually, a bile duct in the right hepatic lobe was cannulated and opacified with contrast. 0.018 wire was successfully advanced into the biliary system and an Accustick dilator set was placed. The Accustick dilator set was exchanged for a Kumpe catheter over a J wire. Subsequently, a Glidewire was used to traverse the central biliary obstruction. Catheter and wire advanced into the common bile duct and duodenum. Superstiff Amplatz wire was placed. The tract was dilated over the wire. Initially, a 10 French biliary drain was advanced over the wire but the drain would not traverse the central biliary obstruction. Therefore, an 8.5 French biliary drain was advanced over the wire. This drain was successfully advanced across the obstruction and into the duodenum. Contrast injection confirmed appropriate placement. Drain was flushed with saline and attached to a gravity bag. Drain was sutured to skin. Fluoroscopic and ultrasound images were taken and saved for documentation. FINDINGS: Ultrasound demonstrated a small amount of perihepatic  ascites. Paracentesis was performed using ultrasound guidance. 280 mL of amber colored fluid was removed. A mildly dilated bile duct in the posterior right hepatic lobe was successfully cannulated and opacified with contrast. Additional right hepatic ducts were opacified with contrast. Central biliary obstruction was identified. No significant filling of left intrahepatic bile ducts. Catheter and wire were advanced through the central biliary  obstruction. Biliary drain was advanced into the duodenum. IMPRESSION: 1. Successful percutaneous transhepatic cholangiogram and placement of internal/external biliary drain. Biliary drain was placed via a right hepatic bile duct. 2. Ultrasound-guided paracentesis yielding 280 mL of fluid. Electronically Signed   By: Juliene Balder M.D.   On: 05/18/2024 20:31   IR Paracentesis Result Date: 05/18/2024 INDICATION: 78 year old with gallbladder cancer, jaundice and elevated bilirubin. Attempted percutaneous biliary drain placement on 05/14/2024 was unsuccessful. Plan to re-attempt biliary drain placement. EXAM: 1. Percutaneous transhepatic cholangiogram using ultrasound and fluoroscopic guidance 2. Placement of internal/external biliary drain 3. Ultrasound-guided paracentesis MEDICATIONS: Cefoxitin 2 g; The antibiotic was administered within an appropriate time frame prior to the initiation of the procedure. ANESTHESIA/SEDATION: Moderate (conscious) sedation was employed during this procedure. A total of Versed  8 mg and Fentanyl  200 mcg and Dilaudid  1 mg was administered intravenously by the radiology nurse. Total intra-service moderate Sedation Time: 70 minutes. The patient's level of consciousness and vital signs were monitored continuously by radiology nursing throughout the procedure under my direct supervision. FLUOROSCOPY: Radiation Exposure Index (as provided by the fluoroscopic device): 169 mGy Kerma CONTRAST:  30 mL Omnipaque  300 COMPLICATIONS: None immediate. PROCEDURE: Informed written consent was obtained from the patient after a thorough discussion of the procedural risks, benefits and alternatives. All questions were addressed. A timeout was performed prior to the initiation of the procedure. The anterior and right side of the abdomen were prepped and draped in sterile fashion. Maximal barrier sterile technique was utilized including caps, mask, sterile gowns, sterile gloves, sterile drape, hand hygiene and  skin antiseptic. Ultrasound demonstrated a small amount of perihepatic ascites that was new. Skin was anesthetized with 1% lidocaine . Using ultrasound guidance, a Yueh catheter was directed in the perihepatic ascites. Paracentesis was performed and 280 mL of amber fluid was removed. Yueh catheter was removed. Multiple attempts were made to cannulate a biliary duct in both the left and right hepatic lobes using ultrasound and fluoroscopic guidance. Procedure was technically difficult due to the minimal intrahepatic biliary dilatation. Eventually, a bile duct in the right hepatic lobe was cannulated and opacified with contrast. 0.018 wire was successfully advanced into the biliary system and an Accustick dilator set was placed. The Accustick dilator set was exchanged for a Kumpe catheter over a J wire. Subsequently, a Glidewire was used to traverse the central biliary obstruction. Catheter and wire advanced into the common bile duct and duodenum. Superstiff Amplatz wire was placed. The tract was dilated over the wire. Initially, a 10 French biliary drain was advanced over the wire but the drain would not traverse the central biliary obstruction. Therefore, an 8.5 French biliary drain was advanced over the wire. This drain was successfully advanced across the obstruction and into the duodenum. Contrast injection confirmed appropriate placement. Drain was flushed with saline and attached to a gravity bag. Drain was sutured to skin. Fluoroscopic and ultrasound images were taken and saved for documentation. FINDINGS: Ultrasound demonstrated a small amount of perihepatic ascites. Paracentesis was performed using ultrasound guidance. 280 mL of amber colored fluid was removed. A mildly dilated bile duct in the posterior right  hepatic lobe was successfully cannulated and opacified with contrast. Additional right hepatic ducts were opacified with contrast. Central biliary obstruction was identified. No significant filling of  left intrahepatic bile ducts. Catheter and wire were advanced through the central biliary obstruction. Biliary drain was advanced into the duodenum. IMPRESSION: 1. Successful percutaneous transhepatic cholangiogram and placement of internal/external biliary drain. Biliary drain was placed via a right hepatic bile duct. 2. Ultrasound-guided paracentesis yielding 280 mL of fluid. Electronically Signed   By: Juliene Balder M.D.   On: 05/18/2024 20:31        Scheduled Meds:  amLODipine  5 mg Oral Daily   budesonide -glycopyrrolate -formoterol   2 puff Inhalation BID   Chlorhexidine  Gluconate Cloth  6 each Topical Daily   docusate sodium   100 mg Oral BID   feeding supplement  237 mL Oral BID BM   lactulose  20 g Oral BID   morphine   15 mg Oral Q12H   pantoprazole   40 mg Oral BID   polyethylene glycol  17 g Oral BID   senna  1 tablet Oral BID   sodium chloride  flush  5 mL Intracatheter Q8H   sodium phosphate   1 enema Rectal Once   Continuous Infusions:     LOS: 7 days    Time spent: 49 minutes spent on 05/19/2024 caring for this patient face-to-face including chart review, ordering labs/tests, documenting, discussion with nursing staff, consultants, updating family and interview/physical exam    Camellia PARAS Nickole Adamek, DO [facility] Available via Epic secure chat 7am-7pm After these hours, please refer to coverage provider listed on [email or web address] 05/19/2024, 10:38 AM

## 2024-05-19 NOTE — Plan of Care (Signed)
  Problem: Education: Goal: Knowledge of General Education information will improve Description: Including pain rating scale, medication(s)/side effects and non-pharmacologic comfort measures Outcome: Progressing   Problem: Elimination: Goal: Will not experience complications related to bowel motility Outcome: Progressing Goal: Will not experience complications related to urinary retention Outcome: Progressing   Problem: Pain Managment: Goal: General experience of comfort will improve and/or be controlled Outcome: Progressing   Problem: Safety: Goal: Ability to remain free from injury will improve Outcome: Progressing

## 2024-05-19 NOTE — Plan of Care (Signed)
   Problem: Education: Goal: Knowledge of General Education information will improve Description Including pain rating scale, medication(s)/side effects and non-pharmacologic comfort measures Outcome: Progressing

## 2024-05-19 NOTE — Plan of Care (Signed)

## 2024-05-20 ENCOUNTER — Other Ambulatory Visit (HOSPITAL_COMMUNITY): Payer: Self-pay

## 2024-05-20 ENCOUNTER — Encounter: Payer: Self-pay | Admitting: Oncology

## 2024-05-20 DIAGNOSIS — R109 Unspecified abdominal pain: Secondary | ICD-10-CM | POA: Diagnosis not present

## 2024-05-20 DIAGNOSIS — C801 Malignant (primary) neoplasm, unspecified: Secondary | ICD-10-CM | POA: Diagnosis not present

## 2024-05-20 DIAGNOSIS — R7989 Other specified abnormal findings of blood chemistry: Secondary | ICD-10-CM | POA: Diagnosis not present

## 2024-05-20 DIAGNOSIS — K831 Obstruction of bile duct: Secondary | ICD-10-CM | POA: Diagnosis not present

## 2024-05-20 DIAGNOSIS — C23 Malignant neoplasm of gallbladder: Secondary | ICD-10-CM | POA: Diagnosis not present

## 2024-05-20 DIAGNOSIS — R935 Abnormal findings on diagnostic imaging of other abdominal regions, including retroperitoneum: Secondary | ICD-10-CM | POA: Diagnosis not present

## 2024-05-20 LAB — COMPREHENSIVE METABOLIC PANEL WITH GFR
ALT: 53 U/L — ABNORMAL HIGH (ref 0–44)
AST: 44 U/L — ABNORMAL HIGH (ref 15–41)
Albumin: 2.9 g/dL — ABNORMAL LOW (ref 3.5–5.0)
Alkaline Phosphatase: 192 U/L — ABNORMAL HIGH (ref 38–126)
Anion gap: 11 (ref 5–15)
BUN: 11 mg/dL (ref 8–23)
CO2: 20 mmol/L — ABNORMAL LOW (ref 22–32)
Calcium: 8.5 mg/dL — ABNORMAL LOW (ref 8.9–10.3)
Chloride: 106 mmol/L (ref 98–111)
Creatinine, Ser: 0.67 mg/dL (ref 0.44–1.00)
GFR, Estimated: >60 mL/min (ref >60)
Glucose, Bld: 107 mg/dL — ABNORMAL HIGH (ref 70–99)
Potassium: 3.4 mmol/L — ABNORMAL LOW (ref 3.5–5.1)
Sodium: 137 mmol/L (ref 135–145)
Total Bilirubin: 5.6 mg/dL — ABNORMAL HIGH (ref 0.0–1.2)
Total Protein: 5.3 g/dL — ABNORMAL LOW (ref 6.5–8.1)

## 2024-05-20 LAB — CBC
HCT: 22.6 % — ABNORMAL LOW (ref 36.0–46.0)
Hemoglobin: 7.2 g/dL — ABNORMAL LOW (ref 12.0–15.0)
MCH: 33.5 pg (ref 26.0–34.0)
MCHC: 31.9 g/dL (ref 30.0–36.0)
MCV: 105.1 fL — ABNORMAL HIGH (ref 80.0–100.0)
Platelets: 118 K/uL — ABNORMAL LOW (ref 150–400)
RBC: 2.15 MIL/uL — ABNORMAL LOW (ref 3.87–5.11)
RDW: 21.3 % — ABNORMAL HIGH (ref 11.5–15.5)
WBC: 7.3 K/uL (ref 4.0–10.5)
nRBC: 0 % (ref 0.0–0.2)

## 2024-05-20 LAB — PREPARE RBC (CROSSMATCH)

## 2024-05-20 MED ORDER — SODIUM CHLORIDE 0.9% IV SOLUTION
Freq: Once | INTRAVENOUS | Status: AC
Start: 2024-05-20 — End: 2024-05-20

## 2024-05-20 MED ORDER — POTASSIUM CHLORIDE 20 MEQ PO PACK
60.0000 meq | PACK | Freq: Once | ORAL | Status: AC
  Administered 2024-05-20: 60 meq via ORAL
  Filled 2024-05-20: qty 3

## 2024-05-20 MED ORDER — SODIUM CHLORIDE FLUSH 0.9 % IV SOLN
INTRAVENOUS | 3 refills | Status: DC
  Filled 2024-05-20: qty 300, 30d supply, fill #0

## 2024-05-20 NOTE — Progress Notes (Addendum)
 Referring Physician(s): Pasam,A  Supervising Physician: Luverne Aran  Patient Status:  [facility] - In-pt  Chief Complaint: Metastatic gallbladder carcinoma, biliary obstruction, hyperbilirubinemia    Subjective: Pt doing okay today.  She states she feels better since biliary drain was placed.  Family in room.  She denies fever, headache, chest pain, dyspnea, cough, nausea, vomiting or bleeding.  She does have some soreness at right upper quadrant drain site.  Remains jaundiced.   Past Medical History:  Diagnosis Date   Arthritis    Asthma    Bronchitis    Cancer (HCC)    dermatosivros arcoma and protuverans   Carpal tunnel syndrome, left upper limb 11/06/2017   Chronic back pain    degenerative disc disease   Constipation    stool softener daily   Constipation    COPD (chronic obstructive pulmonary disease) (HCC)    GERD (gastroesophageal reflux disease)    takes Prilosec daily   GERD (gastroesophageal reflux disease)    Heart murmur    Heart valve disorder    Hemorrhoids    History of hiatal hernia    Ileus (HCC) 03/02/2021   Insomnia    d/t chantix    Joint pain    Nocturia    Ovarian failure 05/28/2022   Personal history of colonic adenoma 03/13/2003   03/13/2003 - 5 mm adenoma   Pre-diabetes    Prediabetes    White coat syndrome without diagnosis of hypertension    Past Surgical History:  Procedure Laterality Date   ABDOMINAL HYSTERECTOMY     ANTERIOR LAT LUMBAR FUSION N/A 02/27/2021   Procedure: Anterior lateral interbody fusion - lateral two - lateral three - lateral three - lateral four with exploration fusion L4-S1;  Surgeon: Onetha Kuba, MD;  Location: [facility] OR;  Service: Neurosurgery;  Laterality: N/A;   BACK SURGERY  1988   BACK SURGERY  09/03/2011   rods,screws x 8    BACK SURGERY     09/2022   BUNIONECTOMY     bilateral   BUNIONECTOMY     bil feet   COLONOSCOPY     dermatosibrosarcoma protuberans     IR BILIARY DRAIN PLACEMENT WITH  CHOLANGIOGRAM  05/14/2024   IR BILIARY DRAIN PLACEMENT WITH CHOLANGIOGRAM  05/18/2024   IR IMAGING GUIDED PORT INSERTION  01/07/2024   IR PARACENTESIS  05/18/2024   LAMINECTOMY WITH POSTERIOR LATERAL ARTHRODESIS LEVEL 3 N/A 02/27/2021   Procedure: Posterior augmentation with pedicle screws Latreal one - lateral four - Posterior Lateral and Interbody fusion;  Surgeon: Onetha Kuba, MD;  Location: [facility] OR;  Service: Neurosurgery;  Laterality: N/A;   PARTIAL HYSTERECTOMY     RECTOCELE REPAIR     Rectum Repair     RESECTION TUMOR WRIST RADICAL     x 2 rt   TUBAL LIGATION  1974   TUBAL LIGATION     WRIST SURGERY     left      Allergies: Sulfa antibiotics, Forteo  [parathyroid hormone (recomb)], Atorvastatin, and Metformin hcl  Medications: Prior to Admission medications   Medication Sig Start Date End Date Taking? Authorizing Provider  albuterol  (VENTOLIN  HFA) 108 (90 Base) MCG/ACT inhaler Inhale 1-2 puffs into the lungs every 6 (six) hours as needed for wheezing or shortness of breath. 04/11/24  Yes Kara Dorn NOVAK, MD  aspirin  81 MG EC tablet Take 81 mg by mouth daily.   Yes [provider]  budesonide -glycopyrrolate -formoterol  (BREZTRI  AEROSPHERE) 160-9-4.8 MCG/ACT AERO inhaler Inhale 2 puffs into the lungs in  the morning and at bedtime. 04/11/24  Yes Kara Dorn NOVAK, MD  docusate sodium  (COLACE) 100 MG capsule Take 500 mg by mouth at bedtime.   Yes [provider]  fluconazole  (DIFLUCAN ) 200 MG tablet Take 1 tablet (200 mg total) by mouth daily. 04/26/24  Yes Pasam, Avinash, MD  lidocaine -prilocaine  (EMLA ) cream Apply to affected area once Patient taking differently: Apply 1 Application topically as needed (for port access). 12/24/23  Yes Pasam, Avinash, MD  loratadine  (CLARITIN ) 10 MG tablet Take 10 mg by mouth See admin instructions. Take 10 mg by mouth once a day on non-chemo weeks   Yes [provider]  losartan (COZAAR) 50 MG tablet Take 1 tablet (50 mg  total) by mouth daily. 05/09/24  Yes Gayle Numbers F, PA-C  magic mouthwash (nystatin, lidocaine , diphenhydrAMINE , alum & mag hydroxide) suspension Take 5 mLs by mouth every 4 (four) hours as needed for mouth pain. Suspension contains equal amounts of Maalox, nystatin, diphenhydramine  and lidocaine . 1:1:1:1 ratio. 04/28/24  Yes Pasam, Avinash, MD  magnesium  oxide (MAG-OX) 400 (240 Mg) MG tablet Take 1 tablet (400 mg total) by mouth 2 (two) times daily. 05/04/24  Yes Pasam, Avinash, MD  megestrol  (MEGACE  ES) 625 MG/5ML suspension Take 5 mLs (625 mg total) by mouth daily. 01/27/24  Yes Pasam, Avinash, MD  metoprolol  succinate (TOPROL -XL) 50 MG 24 hr tablet TAKE 1 TABLET BY MOUTH ONCE DAILY. TAKE WITH OR IMMEDIATELY FOLLOWING A MEAL Patient taking differently: Take 50 mg by mouth daily after breakfast. 11/11/23  Yes Chandra Toribio POUR, MD  morphine  (MS CONTIN ) 15 MG 12 hr tablet Take 1 tablet (15 mg total) by mouth every 12 (twelve) hours. 05/06/24  Yes Pasam, Avinash, MD  omeprazole  (PRILOSEC) 20 MG capsule Take 1 capsule (20 mg total) by mouth daily. Patient taking differently: Take 20 mg by mouth See admin instructions. Take 20 mg by mouth in the morning and evening- 30 minutes before a meal 05/02/24  Yes Clapp, Kara F, PA-C  ondansetron  (ZOFRAN ) 8 MG tablet Take 1 tablet (8 mg total) by mouth every 8 (eight) hours as needed for nausea or vomiting. Start on the third day after cisplatin . 12/24/23  Yes Pasam, Avinash, MD  oxyCODONE  10 MG TABS Take 1-1.5 tablets (10-15 mg total) by mouth every 4 (four) hours as needed for moderate pain (pain score 4-6) or severe pain (pain score 7-10). 12/16/23  Yes Laurence Locus, DO  polyethylene glycol (MIRALAX  / GLYCOLAX ) 17 g packet Take 17 g by mouth 2 (two) times daily as needed for mild constipation or moderate constipation.   Yes [provider]  prochlorperazine  (COMPAZINE ) 10 MG tablet Take 1 tablet (10 mg total) by mouth every 6 (six) hours as needed (Nausea or  vomiting). 12/24/23  Yes Pasam, Avinash, MD  dexamethasone  (DECADRON ) 4 MG tablet Take 2 tablets (8 mg) by mouth daily x 3 days starting the day after cisplatin  chemotherapy. Take with food. Patient not taking: Reported on 05/13/2024 12/24/23   Pasam, Chinita, MD  methylPREDNISolone  (MEDROL  DOSEPAK) 4 MG TBPK tablet Take as directed on package instructions Patient not taking: Reported on 05/13/2024 04/22/24   Pasam, Chinita, MD  ondansetron  (ZOFRAN ) 4 MG tablet Take 1 tablet (4 mg total) by mouth every 6 (six) hours as needed for nausea. Patient not taking: Reported on 05/13/2024 12/16/23   Laurence Locus, DO  rosuvastatin  (CRESTOR ) 10 MG tablet Take 1 tablet by mouth once daily 05/16/24   Clapp, Kara F, PA-C  spironolactone  (ALDACTONE ) 25  MG tablet TAKE 1 TABLET BY MOUTH ONCE DAILY IN THE MORNING 05/06/24   Ladona Heinz, MD     Vital Signs: BP 135/73 (BP Location: Right Arm)   Pulse 99   Temp 98.6 F (37 C) (Oral)   Resp 16   Ht 5' 2 (1.575 m)   Wt 159 lb (72.1 kg)   SpO2 98%   BMI 29.08 kg/m   Physical Exam awake, alert.  Right biliary drain intact, insertion site okay, slightly tender to palpation, output 100+ cc of green bile.  Drain flushed without difficulty.  Imaging: IR BILIARY DRAIN PLACEMENT WITH CHOLANGIOGRAM Result Date: 05/18/2024 INDICATION: 78 year old with gallbladder cancer, jaundice and elevated bilirubin. Attempted percutaneous biliary drain placement on 05/14/2024 was unsuccessful. Plan to re-attempt biliary drain placement. EXAM: 1. Percutaneous transhepatic cholangiogram using ultrasound and fluoroscopic guidance 2. Placement of internal/external biliary drain 3. Ultrasound-guided paracentesis MEDICATIONS: Cefoxitin 2 g; The antibiotic was administered within an appropriate time frame prior to the initiation of the procedure. ANESTHESIA/SEDATION: Moderate (conscious) sedation was employed during this procedure. A total of Versed  8 mg and Fentanyl  200 mcg and Dilaudid  1 mg was  administered intravenously by the radiology nurse. Total intra-service moderate Sedation Time: 70 minutes. The patient's level of consciousness and vital signs were monitored continuously by radiology nursing throughout the procedure under my direct supervision. FLUOROSCOPY: Radiation Exposure Index (as provided by the fluoroscopic device): 169 mGy Kerma CONTRAST:  30 mL Omnipaque  300 COMPLICATIONS: None immediate. PROCEDURE: Informed written consent was obtained from the patient after a thorough discussion of the procedural risks, benefits and alternatives. All questions were addressed. A timeout was performed prior to the initiation of the procedure. The anterior and right side of the abdomen were prepped and draped in sterile fashion. Maximal barrier sterile technique was utilized including caps, mask, sterile gowns, sterile gloves, sterile drape, hand hygiene and skin antiseptic. Ultrasound demonstrated a small amount of perihepatic ascites that was new. Skin was anesthetized with 1% lidocaine . Using ultrasound guidance, a Yueh catheter was directed in the perihepatic ascites. Paracentesis was performed and 280 mL of amber fluid was removed. Yueh catheter was removed. Multiple attempts were made to cannulate a biliary duct in both the left and right hepatic lobes using ultrasound and fluoroscopic guidance. Procedure was technically difficult due to the minimal intrahepatic biliary dilatation. Eventually, a bile duct in the right hepatic lobe was cannulated and opacified with contrast. 0.018 wire was successfully advanced into the biliary system and an Accustick dilator set was placed. The Accustick dilator set was exchanged for a Kumpe catheter over a J wire. Subsequently, a Glidewire was used to traverse the central biliary obstruction. Catheter and wire advanced into the common bile duct and duodenum. Superstiff Amplatz wire was placed. The tract was dilated over the wire. Initially, a 10 French biliary drain  was advanced over the wire but the drain would not traverse the central biliary obstruction. Therefore, an 8.5 French biliary drain was advanced over the wire. This drain was successfully advanced across the obstruction and into the duodenum. Contrast injection confirmed appropriate placement. Drain was flushed with saline and attached to a gravity bag. Drain was sutured to skin. Fluoroscopic and ultrasound images were taken and saved for documentation. FINDINGS: Ultrasound demonstrated a small amount of perihepatic ascites. Paracentesis was performed using ultrasound guidance. 280 mL of amber colored fluid was removed. A mildly dilated bile duct in the posterior right hepatic lobe was successfully cannulated and opacified with contrast. Additional right hepatic ducts  were opacified with contrast. Central biliary obstruction was identified. No significant filling of left intrahepatic bile ducts. Catheter and wire were advanced through the central biliary obstruction. Biliary drain was advanced into the duodenum. IMPRESSION: 1. Successful percutaneous transhepatic cholangiogram and placement of internal/external biliary drain. Biliary drain was placed via a right hepatic bile duct. 2. Ultrasound-guided paracentesis yielding 280 mL of fluid. Electronically Signed   By: Juliene Balder M.D.   On: 05/18/2024 20:31   IR Paracentesis Result Date: 05/18/2024 INDICATION: 78 year old with gallbladder cancer, jaundice and elevated bilirubin. Attempted percutaneous biliary drain placement on 05/14/2024 was unsuccessful. Plan to re-attempt biliary drain placement. EXAM: 1. Percutaneous transhepatic cholangiogram using ultrasound and fluoroscopic guidance 2. Placement of internal/external biliary drain 3. Ultrasound-guided paracentesis MEDICATIONS: Cefoxitin 2 g; The antibiotic was administered within an appropriate time frame prior to the initiation of the procedure. ANESTHESIA/SEDATION: Moderate (conscious) sedation was employed  during this procedure. A total of Versed  8 mg and Fentanyl  200 mcg and Dilaudid  1 mg was administered intravenously by the radiology nurse. Total intra-service moderate Sedation Time: 70 minutes. The patient's level of consciousness and vital signs were monitored continuously by radiology nursing throughout the procedure under my direct supervision. FLUOROSCOPY: Radiation Exposure Index (as provided by the fluoroscopic device): 169 mGy Kerma CONTRAST:  30 mL Omnipaque  300 COMPLICATIONS: None immediate. PROCEDURE: Informed written consent was obtained from the patient after a thorough discussion of the procedural risks, benefits and alternatives. All questions were addressed. A timeout was performed prior to the initiation of the procedure. The anterior and right side of the abdomen were prepped and draped in sterile fashion. Maximal barrier sterile technique was utilized including caps, mask, sterile gowns, sterile gloves, sterile drape, hand hygiene and skin antiseptic. Ultrasound demonstrated a small amount of perihepatic ascites that was new. Skin was anesthetized with 1% lidocaine . Using ultrasound guidance, a Yueh catheter was directed in the perihepatic ascites. Paracentesis was performed and 280 mL of amber fluid was removed. Yueh catheter was removed. Multiple attempts were made to cannulate a biliary duct in both the left and right hepatic lobes using ultrasound and fluoroscopic guidance. Procedure was technically difficult due to the minimal intrahepatic biliary dilatation. Eventually, a bile duct in the right hepatic lobe was cannulated and opacified with contrast. 0.018 wire was successfully advanced into the biliary system and an Accustick dilator set was placed. The Accustick dilator set was exchanged for a Kumpe catheter over a J wire. Subsequently, a Glidewire was used to traverse the central biliary obstruction. Catheter and wire advanced into the common bile duct and duodenum. Superstiff Amplatz  wire was placed. The tract was dilated over the wire. Initially, a 10 French biliary drain was advanced over the wire but the drain would not traverse the central biliary obstruction. Therefore, an 8.5 French biliary drain was advanced over the wire. This drain was successfully advanced across the obstruction and into the duodenum. Contrast injection confirmed appropriate placement. Drain was flushed with saline and attached to a gravity bag. Drain was sutured to skin. Fluoroscopic and ultrasound images were taken and saved for documentation. FINDINGS: Ultrasound demonstrated a small amount of perihepatic ascites. Paracentesis was performed using ultrasound guidance. 280 mL of amber colored fluid was removed. A mildly dilated bile duct in the posterior right hepatic lobe was successfully cannulated and opacified with contrast. Additional right hepatic ducts were opacified with contrast. Central biliary obstruction was identified. No significant filling of left intrahepatic bile ducts. Catheter and wire were advanced through the  central biliary obstruction. Biliary drain was advanced into the duodenum. IMPRESSION: 1. Successful percutaneous transhepatic cholangiogram and placement of internal/external biliary drain. Biliary drain was placed via a right hepatic bile duct. 2. Ultrasound-guided paracentesis yielding 280 mL of fluid. Electronically Signed   By: Juliene Balder M.D.   On: 05/18/2024 20:31    Labs:  CBC: Recent Labs    05/16/24 0505 05/17/24 0759 05/19/24 0520 05/20/24 0500  WBC 6.8 7.1 6.3 7.3  HGB 7.7* 8.0* 7.5* 7.2*  HCT 24.0* 24.9* 23.4* 22.6*  PLT 108* 124* 113* 118*    COAGS: Recent Labs    12/14/23 1825 05/14/24 0420  INR 1.1 1.2    BMP: Recent Labs    05/16/24 0505 05/17/24 0759 05/19/24 0520 05/20/24 0500  NA 137 137 137 137  K 4.0 4.2 3.7 3.4*  CL 105 104 107 106  CO2 23 22 21* 20*  GLUCOSE 111* 121* 110* 107*  BUN 9 9 12 11   CALCIUM  8.5* 8.5* 8.7* 8.5*   CREATININE 0.73 0.79 0.80 0.67  GFRNONAA >60 >60 >60 >60    LIVER FUNCTION TESTS: Recent Labs    05/16/24 0505 05/17/24 0759 05/19/24 0520 05/20/24 0500  BILITOT 6.7* 8.4* 7.9* 5.6*  AST 75* 73* 69* 44*  ALT 88* 81* 70* 53*  ALKPHOS 206* 227* 223* 192*  PROT 5.1* 5.5* 5.3* 5.3*  ALBUMIN  2.9* 2.9* 2.8* 2.9*    Assessment and Plan: Patient with history of metastatic gallbladder carcinoma, biliary obstruction, abdominal pain, COPD, anemia/thrombocytopenia, left portal vein /hepatic vein thrombosis, hyperbilirubinemia; status post unsuccessful biliary drain placement on 05/14/2024 due to small size of peripheral bile ducts; s/p successful PTC with placement of a right internal/external biliary drain (10 fr to bag) and paracentesis on 11/5; afebrile, WBC normal, hemoglobin 7.2 down from 7.5- consider transfusion , creatinine normal, total bilirubin 5.6 down from 7.9; continue with every 8 hour drain flushes while in hospital, switch to once daily drain flushes at home.  Monitor labs closely ; patient/family instructed on drain maintenance and how to flush drain.  Prescription for saline flushes were given to patient.  Will plan for follow-up cholangiogram/routine drain exchange in 4 weeks; other plans as per primary/oncology   Electronically Signed: D. Franky Rakers, PA-C 05/20/2024, 11:44 AM   I spent a total of 15 Minutes at the the patient's bedside AND on the patient's hospital floor or unit, greater than 50% of which was counseling/coordinating care for biliary drain    Patient ID: Janice Pace, female   DOB: 11-28-1945, 78 y.o.   MRN: [id number]

## 2024-05-20 NOTE — Progress Notes (Signed)
 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 - The disease burden is causing biliary obstruction and jaundice. - GI has evaluated the patient and because of the location, they cannot help with stenting/intervention.  Recommended IR evaluation for external biliary drain placement. -IR attempted external biliary drain placement on 05/14/2024 but it was not successful.   - IR performed external and internal biliary drainage on 05/18/2024. - Bilirubin slowly getting better and it was 5.6 today.  Rest of the LFTs are also improving. - Previously I discussed MRI findings with the patient and her family members.  Explained unfortunate disease progression.  Discussed need to switch systemic treatments however her bilirubin has to return to close to normal for her to be a candidate for further systemic treatments. - On 05/16/2024, I introduced the concept of hospice, if her clinical situation worsens or if she has progressive jaundice, which precludes systemic chemotherapy.  Patient verbalized understanding.  She would like to take 1 day at a time. - I will plan to see her back in clinic to discuss next steps in management, depending on LFT recovery.  Left portal vein thrombus - Portal vein thrombosis as noted on MRI/MRCP.  Patient has had anemia which needed PRBC transfusions recently.  There is concern for occult blood loss.  Hence we will proceed with prophylactic dose of Eliquis at 2.5 mg p.o. twice daily instead of full-strength anticoagulation.   Patient was advised to watch for any signs of bleeding and we will discontinue anticoagulation in such an instance. - Continue to monitor hemoglobin daily.   Obstructive jaundice due to malignant biliary obstruction Obstructive jaundice secondary to malignant biliary obstruction from gallbladder cancer, with elevated bilirubin levels limiting chemotherapy options. Interventional radiology is considering another attempt to place a drain to alleviate obstruction and reduce bilirubin levels. - Consulted interventional radiology for potential external biliary drain placement.  She underwent successful external and internal biliary drain placement on 05/18/2024 by IR.  We greatly appreciate their help. - Monitor bilirubin levels and liver function tests.  She can be discharged home from our standpoint, whenever she is deemed stable from medical standpoint.  I will plan to see her back in clinic to follow-up on her LFTs and determine further course of action.  Agree with transfusing 1 unit of PRBCs today.  Please call with any questions.   Chinita Patten, MD 05/20/2024 4:26 PM  ONCOLOGY HISTORY:    78 y.o. lady with past medical history of essential hypertension, osteoarthritis, COPD/asthma, GERD, depression, anxiety, hiatal hernia, chronic back pain presented to the ED on 12/14/2023 with complaints of worsening epigastric/abdominal pain over 2-3 weeks.  Routine blood work was unremarkable in the ER.  However CT abdomen and pelvis showed irregular appearance of the gallbladder with suggestion of capsular thickening along the fundus of the gallbladder.  Gallbladder appeared inseparable from the adjacent portion of the liver.  Clinical picture was concerning for gallbladder carcinoma.  Mesenteric nodular thickening along the mid lower abdominal cavity indicating peritoneal carcinomatosis.  Soft tissue nodular mass in the left lower quadrant region of the pelvis  measuring 3.6 x 2.7 cm, likely ovarian neoplastic  lesion.  Multiple lung nodules in the lower lobes bilaterally, largest measuring 12 mm in the right lower lobe, concerning for pulmonary metastatic disease.   She was admitted to the hospital for further evaluation and management.  IR was consulted for peritoneal mass biopsy.  CA 19-9 was significant elevated at 14,368.  CEA was increased at 98.7, CA-125 was also increased at 91.3.   On 12/14/2023, MRI abdomen/MRCP showed thickened, contracted gallbladder with a heterogeneous mass arising from the gallbladder fundus, contiguous with the adjacent liver parenchyma of hepatic segment IVB measuring 3.5 x 2.8 cm. Multiple small rim enhancing lesions within the liver parenchyma. Enlarged, necrotic appearing portacaval and porta hepatis lymph nodes. Small, although rim enhancing and abnormal appearing retroperitoneal lymph nodes. Peritoneal stranding and nodularity, incompletely visualized on this examination although present in the left upper quadrant, consistent with peritoneal metastatic disease. Numerous small bilateral pulmonary nodules, better assessed by CT. 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   On 12/15/2023, she underwent CT-guided biopsy of the omental lesion.  Pathology showed metastatic well to moderately differentiated adenocarcinoma. The tumor is positive for cytokeratin 7 and shows focal weak positivity for the GI marker CDX2.  The tumor is negative for the GI markers cytokeratin 20.  The tumor is also negative for the GU and GYN marker PAX8.  The tumor is negative for the pulmonary adeno marker TTF-1. This immunohistochemical pattern and histomorphology would be compatible with the clinical suspicion of a gallbladder primary.  The differential diagnosis would also include an upper GI or pancreaticobiliary primary.    Patient presented to our clinic on 12/24/2023 to establish care with us .  Request placed for staging PET scan.  NGS panel  testing requested on the specimen and also liquid biopsy obtained.  It showed no actionable mutations.   Staging PET scan on 12/29/2023 showed known gallbladder mass extending into the liver.  Peritoneal carcinomatosis noted.  Numerous lung nodules were identified, small bowel with FDG uptake, concerning for metastatic disease in the lungs.  Metastatic lymphadenopathy was noted in the abdomen and retroperitoneum.  This scan will serve as a good baseline for future compression.    Plan for palliative systemic chemoimmunotherapy with cisplatin , gemcitabine , durvalumab . Scheduled to start C1D1 from 01/01/24.    Restaging PET scan on 03/21/2024 showed overall improvement in disease.  She was tolerating treatments well up until early October 2025 when she started having some swallowing difficulty, clinical picture concerning for esophageal candidiasis and a skin rash which led to a break in systemic treatments   She presented to the ED on 05/11/2024 with complaints of abdominal pain and possible obstipation.  CT abdomen and pelvis on 05/11/2024 showed suspected new hepatic lesions in both hepatic lobes, mild central intrahepatic biliary dilatation.  Necrotic appearing periportal nodal disease may be slightly larger at 3 x 2.8 cm, compared to 2.9 x 2.7 cm previously.  There may be slight narrowing of the adjacent main portal vein and there also appears to be significant new stenosis of the left portal vein.  MRI/MRCP would be helpful for further evaluation.   Bilirubin has been slowly trending upwards.  Previously normal at 0.5 when we saw her in clinic.  It was 3.9 on arrival to ED.  Alkaline phosphatase, AST ALT were also elevated.  On 05/13/2024, MRI/MRCP showed evidence of disease progression with multiple new lesions throughout the liver which are increased in size and number,  compatible with worsening metastatic disease.  GI consulted.  They could not do intervention because of the location of biliary  obstruction.  On 05/14/2024, IR attempted external biliary drainage but it was not successful.  Plan for IR reevaluation on 05/17/2024 or 05/18/2024.  SUBJECTIVE:   She reports post-procedural pain and nausea following a drainage procedure.  She experiences soreness and difficulty moving after a drainage procedure performed yesterday, describing the pain as sore rather than sharp.  She has a lack of appetite, stating she has 'not been hungry,' but can eat if she makes herself. She experienced nausea earlier, attributed to acid reflux, but currently has no nausea or vomiting.   OBJECTIVE:  Vitals:   05/20/24 1340 05/20/24 1355  BP: 135/69 (!) 149/88  Pulse: (!) 106 (!) 103  Resp: 19 18  Temp: 99.3 F (37.4 C) 98.1 F (36.7 C)  SpO2:  97%     Intake/Output Summary (Last 24 hours) at 05/20/2024 1626 Last data filed at 05/20/2024 9057 Gross per 24 hour  Intake 730 ml  Output 800 ml  Net -70 ml    Physical Exam Constitutional:      General: She is not in acute distress.    Appearance: Normal appearance.  HENT:     Head: Normocephalic and atraumatic.  Eyes:     General: Scleral icterus present.  Cardiovascular:     Rate and Rhythm: Normal rate.  Pulmonary:     Effort: Pulmonary effort is normal. No respiratory distress.  Abdominal:     General: There is no distension.  Skin:    Coloration: Skin is jaundiced.     Findings: Rash (scattered maculopapular rash, improved) present.  Neurological:     General: No focal deficit present.     Mental Status: She is alert and oriented to person, place, and time.  Psychiatric:        Mood and Affect: Mood normal.        Behavior: Behavior normal.      LABS:   Results for orders placed or performed during the hospital encounter of 05/11/24 (from the past 24 hours)  Comprehensive metabolic panel with GFR     Status: Abnormal   Collection Time: 05/20/24  5:00 AM  Result Value Ref Range   Sodium 137 135 - 145 mmol/L   Potassium  3.4 (L) 3.5 - 5.1 mmol/L   Chloride 106 98 - 111 mmol/L   CO2 20 (L) 22 - 32 mmol/L   Glucose, Bld 107 (H) 70 - 99 mg/dL   BUN 11 8 - 23 mg/dL   Creatinine, Ser 9.32 0.44 - 1.00 mg/dL   Calcium  8.5 (L) 8.9 - 10.3 mg/dL   Total Protein 5.3 (L) 6.5 - 8.1 g/dL   Albumin  2.9 (L) 3.5 - 5.0 g/dL   AST 44 (H) 15 - 41 U/L   ALT 53 (H) 0 - 44 U/L   Alkaline Phosphatase 192 (H) 38 - 126 U/L   Total Bilirubin 5.6 (H) 0.0 - 1.2 mg/dL   GFR, Estimated >39 >39 mL/min   Anion gap 11 5 - 15  CBC     Status: Abnormal   Collection Time: 05/20/24  5:00 AM  Result Value Ref Range   WBC 7.3 4.0 - 10.5 K/uL   RBC 2.15 (L) 3.87 - 5.11 MIL/uL   Hemoglobin 7.2 (L) 12.0 - 15.0 g/dL   HCT 77.3 (L) 63.9 - 53.9 %   MCV 105.1 (H) 80.0 - 100.0 fL   MCH  33.5 26.0 - 34.0 pg   MCHC 31.9 30.0 - 36.0 g/dL   RDW 78.6 (H) 88.4 - 84.4 %   Platelets 118 (L) 150 - 400 K/uL   nRBC 0.0 0.0 - 0.2 %  Type and screen [facility]     Status: None (Preliminary result)   Collection Time: 05/20/24 12:02 PM  Result Value Ref Range   ABO/RH(D) B POS    Antibody Screen NEG    Sample Expiration 05/23/2024,2359    Unit Number [id number]    Blood Component Type RED CELLS,LR    Unit division 00    Status of Unit ISSUED    Transfusion Status OK TO TRANSFUSE    Crossmatch Result      Compatible Performed at [facility], [address] [place], [place] [address]   Prepare RBC (crossmatch)     Status: None   Collection Time: 05/20/24 12:08 PM  Result Value Ref Range   Order Confirmation      ORDER PROCESSED BY BLOOD BANK Performed at [facility] [address], [place], [place] [address]       IMAGING STUDIES:   IR BILIARY DRAIN PLACEMENT WITH CHOLANGIOGRAM Result Date: 05/14/2024 INDICATION: 78 year old with metastatic gallbladder carcinoma and found to have progressive disease. Patient has mild biliary dilatation and elevated bilirubin. Plan for  percutaneous transhepatic cholangiogram and attempt biliary drain placement. EXAM: 1. Percutaneous transhepatic cholangiogram using ultrasound and fluoroscopic guidance. 2. Attempted placement of percutaneous biliary drain. MEDICATIONS: Cefoxitin 2 g; The antibiotic was administered within an appropriate time frame prior to the initiation of the procedure. ANESTHESIA/SEDATION: Moderate (conscious) sedation was employed during this procedure. A total of Versed  4 mg and Fentanyl  150 mcg was administered intravenously by the radiology nurse. Total intra-service moderate Sedation Time: 70 minutes. The patient's level of consciousness and vital signs were monitored continuously by radiology nursing throughout the procedure under my direct supervision. FLUOROSCOPY: Radiation Exposure Index (as provided by the fluoroscopic device): 311 mGy Kerma COMPLICATIONS: None immediate. PROCEDURE: Informed written consent was obtained from the patient after a thorough discussion of the procedural risks, benefits and alternatives. All questions were addressed. Maximal Sterile Barrier Technique was utilized including caps, mask, sterile gowns, sterile gloves, sterile drape, hand hygiene and skin antiseptic. A timeout was performed prior to the initiation of the procedure. Right side of the abdomen was prepped and draped in sterile fashion. Ultrasound identified mild intrahepatic biliary dilatation in the right hepatic lobe. Right side of the abdomen was anesthetized with 1% lidocaine . Using ultrasound guidance, 21 gauge needle was directed into a peripheral bile duct. Contrast injection confirmed placement in the biliary system. Multiple attempts were made to cannulate a peripheral bile duct using 0.018 wires. These attempts to cannulate the biliary system with a wire were unsuccessful. Additional bile ducts were targeted using ultrasound and fluoroscopic guidance but these bile ducts could not be successfully cannulated with a wire.  Eventually, the procedure was aborted. Bandage placed at the puncture sites. Fluoroscopic and ultrasound images were taken and saved for documentation. FINDINGS: Ultrasound demonstrated mild intrahepatic biliary dilatation. A peripheral right hepatic bile duct was targeted with ultrasound. A peripheral right hepatic bile duct was successfully opacified with contrast. Mild to moderate dilatation in the central portion of this bile duct. An additional bile duct towards the hepatic dome was also opacified. There is no filling of the left hepatic ducts. Contrast did drain into the common bile duct and duodenum. Lack of  contrast at the biliary confluence probably related to obstructing lesions. IMPRESSION: 1. Percutaneous transhepatic cholangiogram demonstrates mild intrahepatic biliary dilatation but incomplete opacification of the biliary system. Findings are suggestive for partial obstructive disease in the central aspect of the biliary system but there is drainage into the extrahepatic bile duct and duodenum. 2. Attempts to place a percutaneous biliary drain were unsuccessful due to the small size of the peripheral bile ducts. Electronically Signed   By: Juliene Balder M.D.   On: 05/14/2024 17:41   MR ABDOMEN MRCP W WO CONTAST Result Date: 05/13/2024 CLINICAL DATA:  Epigastric pain [id number] Epigastric pain 114842; Epigastric pain. EXAM: MRI ABDOMEN WITHOUT AND WITH CONTRAST (INCLUDING MRCP) TECHNIQUE: Multiplanar multisequence MR imaging of the abdomen was performed both before and after the administration of intravenous contrast. Heavily T2-weighted images of the biliary and pancreatic ducts were obtained, and three-dimensional MRCP images were rendered by post processing. CONTRAST:  7mL GADAVIST  GADOBUTROL  1 MMOL/ML IV SOLN COMPARISON:  Ultrasound abdomen and CT scan abdomen and pelvis from 05/11/2024. FINDINGS: Lower chest: Unremarkable MR appearance to the lung bases. No pleural effusion. No pericardial effusion.  Normal heart size. Hepatobiliary: The liver is moderately enlarged in size measuring up to 17.7 cm in length. Noncirrhotic configuration. There are multiple (around 10), T2 hyperintense,, peripherally rim enhancing target like lesions throughout liver, which have increased in size and number since the prior study and compatible with worsening metastatic disease. There is redemonstration of heterogeneous contracted gallbladder with ill-defined heterogeneous peripherally irregular wall enhancing lesion in the fundus of the gallbladder which appears to infiltrate into the left hepatic lobe, segment 4B, suggesting infiltrating tumor. The lesion measures up to 2.9 x 3.1 cm, grossly similar to the prior study., There is mild-to-moderate central intrahepatic bile duct dilation. There is abrupt narrowing/cutoff of the proximal extrahepatic bile duct. The mid/distal extrahepatic bile duct is nondilated. There is ill-defined peripheral irregular wall enhancing mass in the caudate lobe which is likely the cause of abrupt cutoff of the central biliary tree. Findings may be due to primary hilar cholangiocarcinoma versus metastases. The middle and right intrahepatic veins are patent. Left hepatic vein is not well seen, likely thrombosed. There is moderate narrowing in the midportion of main portal vein (series 19, image 31). Right portal vein and its major tributaries are patent. However, there is nonvisualization of left portal vein, suggesting thrombosis. These findings are new since the prior study from 12/14/2023. Pancreas: No mass, inflammatory changes or other parenchymal abnormality identified. No main pancreatic duct dilation. Spleen: Top-normal spleen measuring upto 5.5 x 12.9 orthogonally on coronal plane. No focal mass. Adrenals/Urinary Tract: Bilateral adrenal adenomas noted measuring 1.1 x 1.5 cm on the left side and 1.0 x 1.5 cm on the right side. No hydroureteronephrosis. No suspicious renal mass. Stomach/Bowel:  Visualized portions within the abdomen are unremarkable. No disproportionate dilation of bowel loops. Vascular/Lymphatic: No pathologically enlarged lymph nodes identified. No abdominal aortic aneurysm demonstrated. No ascites. Redemonstration of multiple nodular implants throughout the abdomen, compatible with peritoneal carcinomatosis. Overall, there is mild interval increase in the size, suggesting worsening peritoneal carcinomatosis. Other: Susceptibility artifact from thoracolumbar spine spinal fixation hardware noted. Musculoskeletal: No suspicious bone lesions identified. IMPRESSION: 1. There is redemonstration of heterogeneous contracted gallbladder with ill-defined heterogeneous peripherally irregular wall enhancing lesion in the fundus of the gallbladder which appears to infiltrate into the left hepatic lobe, segment 4B, suggesting infiltrating tumor. 2. There are multiple (around 10), T2 hyperintense, peripherally rim enhancing target like lesions throughout  liver, which have increased in size and number since the prior study and compatible with worsening metastatic disease. 3. There is mild-to-moderate central intrahepatic bile duct dilation. There is abrupt narrowing/cutoff of the proximal extrahepatic bile duct. The mid/distal extrahepatic bile duct is nondilated. There is ill-defined peripheral irregular wall enhancing mass in the caudate lobe which is likely the cause of abrupt cutoff of the central biliary tree. Findings may be due to primary hilar cholangiocarcinoma versus metastases. 4. There is moderate narrowing in the midportion of main portal vein. Right portal vein and its major tributaries are patent. However, there is nonvisualization of left portal vein, suggesting thrombosis. There is also new nonvisualization of left hepatic vein. These findings are new since the prior study from 12/14/2023. 5. Bilateral adrenal adenomas. Electronically Signed   By: Ree Molt M.D.   On: 05/13/2024  13:26   MR 3D Recon At Scanner Result Date: 05/13/2024 CLINICAL DATA:  Epigastric pain 114842 Epigastric pain 114842; Epigastric pain. EXAM: MRI ABDOMEN WITHOUT AND WITH CONTRAST (INCLUDING MRCP) TECHNIQUE: Multiplanar multisequence MR imaging of the abdomen was performed both before and after the administration of intravenous contrast. Heavily T2-weighted images of the biliary and pancreatic ducts were obtained, and three-dimensional MRCP images were rendered by post processing. CONTRAST:  7mL GADAVIST  GADOBUTROL  1 MMOL/ML IV SOLN COMPARISON:  Ultrasound abdomen and CT scan abdomen and pelvis from 05/11/2024. FINDINGS: Lower chest: Unremarkable MR appearance to the lung bases. No pleural effusion. No pericardial effusion. Normal heart size. Hepatobiliary: The liver is moderately enlarged in size measuring up to 17.7 cm in length. Noncirrhotic configuration. There are multiple (around 10), T2 hyperintense,, peripherally rim enhancing target like lesions throughout liver, which have increased in size and number since the prior study and compatible with worsening metastatic disease. There is redemonstration of heterogeneous contracted gallbladder with ill-defined heterogeneous peripherally irregular wall enhancing lesion in the fundus of the gallbladder which appears to infiltrate into the left hepatic lobe, segment 4B, suggesting infiltrating tumor. The lesion measures up to 2.9 x 3.1 cm, grossly similar to the prior study., There is mild-to-moderate central intrahepatic bile duct dilation. There is abrupt narrowing/cutoff of the proximal extrahepatic bile duct. The mid/distal extrahepatic bile duct is nondilated. There is ill-defined peripheral irregular wall enhancing mass in the caudate lobe which is likely the cause of abrupt cutoff of the central biliary tree. Findings may be due to primary hilar cholangiocarcinoma versus metastases. The middle and right intrahepatic veins are patent. Left hepatic vein is  not well seen, likely thrombosed. There is moderate narrowing in the midportion of main portal vein (series 19, image 31). Right portal vein and its major tributaries are patent. However, there is nonvisualization of left portal vein, suggesting thrombosis. These findings are new since the prior study from 12/14/2023. Pancreas: No mass, inflammatory changes or other parenchymal abnormality identified. No main pancreatic duct dilation. Spleen: Top-normal spleen measuring upto 5.5 x 12.9 orthogonally on coronal plane. No focal mass. Adrenals/Urinary Tract: Bilateral adrenal adenomas noted measuring 1.1 x 1.5 cm on the left side and 1.0 x 1.5 cm on the right side. No hydroureteronephrosis. No suspicious renal mass. Stomach/Bowel: Visualized portions within the abdomen are unremarkable. No disproportionate dilation of bowel loops. Vascular/Lymphatic: No pathologically enlarged lymph nodes identified. No abdominal aortic aneurysm demonstrated. No ascites. Redemonstration of multiple nodular implants throughout the abdomen, compatible with peritoneal carcinomatosis. Overall, there is mild interval increase in the size, suggesting worsening peritoneal carcinomatosis. Other: Susceptibility artifact from thoracolumbar spine spinal fixation hardware  noted. Musculoskeletal: No suspicious bone lesions identified. IMPRESSION: 1. There is redemonstration of heterogeneous contracted gallbladder with ill-defined heterogeneous peripherally irregular wall enhancing lesion in the fundus of the gallbladder which appears to infiltrate into the left hepatic lobe, segment 4B, suggesting infiltrating tumor. 2. There are multiple (around 10), T2 hyperintense, peripherally rim enhancing target like lesions throughout liver, which have increased in size and number since the prior study and compatible with worsening metastatic disease. 3. There is mild-to-moderate central intrahepatic bile duct dilation. There is abrupt narrowing/cutoff of the  proximal extrahepatic bile duct. The mid/distal extrahepatic bile duct is nondilated. There is ill-defined peripheral irregular wall enhancing mass in the caudate lobe which is likely the cause of abrupt cutoff of the central biliary tree. Findings may be due to primary hilar cholangiocarcinoma versus metastases. 4. There is moderate narrowing in the midportion of main portal vein. Right portal vein and its major tributaries are patent. However, there is nonvisualization of left portal vein, suggesting thrombosis. There is also new nonvisualization of left hepatic vein. These findings are new since the prior study from 12/14/2023. 5. Bilateral adrenal adenomas. Electronically Signed   By: Ree Molt M.D.   On: 05/13/2024 13:26   US  Abdomen Limited RUQ (LIVER/GB) Result Date: 05/11/2024 EXAM: Right Upper Quadrant Abdominal Ultrasound 05/11/2024 06:56:14 PM CLINICAL HISTORY: Abnormal transaminases. TECHNIQUE: Real-time ultrasonography of the right upper quadrant of the abdomen was performed. COMPARISON: US  Abdomen Limited 12/14/2023. CT abdomen pelvis 08/24/2023. FINDINGS: LIVER: Nodular hepatic contour. Coarsened echogenicity of the hepatic parenchyma. No intrahepatic biliary ductal dilatation. No focal liver lesions identified. BILIARY SYSTEM: Gallbladder wall not visualized. The common bile duct measures 6.0 mm. OTHER: No right upper quadrant ascites. IMPRESSION: 1. Cirrhosis. No focal liver lesions identified. Liver protocol contrast-enhanced MR or CT are most sensitive for HCC screening in cirrhosis. 2. Gallbladder wall not visualized. Electronically signed by: Morgane Naveau MD 05/11/2024 07:06 PM EDT RP Workstation: [id number]   CT ABDOMEN PELVIS W CONTRAST Addendum Date: 05/11/2024 ** ADDENDUM #1 ** ADDENDUM: -- Clinicians worried about biliary obstruction. There is mild central intrahepatic biliary dilatation, slightly increased when compared to the prior CT scan from 12/14/2023. The gallbladder  is surgically absent. The cystic and solid appearing process in the gallbladder fossa is likely tumor and is grossly unchanged. The necrotic-appearing periportal nodal disease maybe slightly larger. It measures approximately 3.0x2.8 cm and previously measured 2.9x2.7 cm. There may be slight narrowing of the adjacent main portal vein and there also appears to be significant new stenosis of the left portal vein. MRI/MRCP may be helpful for further evaluation. -------------------------------------------------- Electronically signed by: Maude Stammer MD 05/11/2024 07:07 PM EDT RP Workstation: [id number]   Result Date: 05/11/2024 ** ORIGINAL REPORT ** EXAM: CT ABDOMEN AND PELVIS WITH CONTRAST 05/11/2024 03:50:05 PM TECHNIQUE: CT of the abdomen and pelvis was performed with the administration of 80 mL of iohexol  (OMNIPAQUE ) 300 MG/ML solution. Multiplanar reformatted images are provided for review. Automated exposure control, iterative reconstruction, and/or weight-based adjustment of the mA/kV was utilized to reduce the radiation dose to as low as reasonably achievable. COMPARISON: PET CT 03/21/2024 and CT abdomen/pelvis 12/14/2023. CLINICAL HISTORY: Bowel obstruction suspected; LLQ abdominal pain. FINDINGS: LOWER CHEST: Persistent/stable pulmonary metastatic disease. No pleural or pericardial effusion. Stable vascular calcifications. LIVER: Hepatic metastatic disease. Suspect new hepatic lesions in both hepatic lobes. 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 Persistent mild intrahepatic biliary dilatation. Stable necrotic-appearing periportal adenopathy. GALLBLADDER AND BILE DUCTS: Surgically absent.  No biliary ductal dilatation. SPLEEN: No splenic lesions. PANCREAS: The pancreas is unremarkable and stable. ADRENAL GLANDS: Bilateral adrenal gland nodules are stable. KIDNEYS, URETERS AND BLADDER: No renal lesions. The bladder is normal. No stones in the kidneys or  ureters. No hydronephrosis. No perinephric or periureteral stranding. GI AND BOWEL: The stomach, duodenum, small bowel, and colon are unremarkable. No findings suspicious for bowel obstruction. Moderate stool burden. PERITONEUM AND RETROPERITONEUM: No free abdominal or free pelvic fluid. No mesenteric or retroperitoneal mass. Overall stable scattered omental implants near the liver and near the transverse colon. VASCULATURE: Stable advanced atherosclerotic calcifications involving the aorta and branch vessels but no aneurysm or dissection. The major venous structures are patent. LYMPH NODES: No lymphadenopathy. REPRODUCTIVE ORGANS: BONES AND SOFT TISSUES: The bony structures are intact. Extensive surgical changes involving the THORACIC and LUMBAR spines without obvious complicating features. Diffuse osteoporosis. No focal soft tissue abnormality. No inguinal mass or adenopathy. IMPRESSION: 1. No CT evidence of bowel obstruction. 2. Hepatic metastatic disease with suspected new hepatic lesions in both hepatic lobes. 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 3. Stable scattered omental implants near the liver and near the transverse colon. 4. Stable cystic and solid mass in the central pelvis measuring 4.8 cm. Electronically signed by: Maude Stammer MD 05/11/2024 04:38 PM EDT RP Workstation: [id number]

## 2024-05-20 NOTE — Progress Notes (Signed)
 PROGRESS NOTE    Janice Pace  FMW:[id number] DOB: 1945/11/08 DOA: 05/11/2024 PCP: Gayle Saddie FALCON, PA-C    Brief Narrative:   Janice Pace is a 78 y.o. 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  Pain reported as a cramping sensation, difficulty with urination and bowel movement.  In the ED, temperature 98.6 F, HR 80, RR 18, BP 171/100, SpO2 100% on room air.  WBC 6.3, hemoglobin 9.3, platelet count 125.  Sodium 141, Tessman 3.3, chloride 106, CO2 23, glucose 95, BUN 16, creat 1.36.  AST 116, ALT 128, total bilirubin 2.8.  Urinalysis unrevealing.  CT abdomen/pelvis with contrast with no evidence of bowel obstruction, noted hepatic metastatic disease with suspected new hepatic lesions in both lobes, stable changes to gallbladder cancer with involvement of left hepatic lobe, scattered omental implants near liver and transverse colon, stable cystic solid mass central pelvis measuring 4.8 cm.  TRH consulted for admission for further evaluation management of suspected obstructive jaundice with imaging evidence of metastatic disease progression.  Assessment & Plan:   Obstructive jaundice Stage IV gallbladder carcinoma with peritoneal, lung, hepatic metastasis; progressive Patient presenting to the ED with abdominal pain.  CT abdomen/pelvis, MRCP with findings of worsening disease progression with multiple new liver lesions.  Suspect disease burden likely causing biliary obstruction and jaundice.  Evaluated by gastroenterology, due to location unable to assist with stenting or intervention and recommended IR for external biliary drain placement.  Seen by IR on 05/14/2024, unfortunately not successful.  Patient underwent reattempt of biliary drain placement by IR on 05/18/2024 which  was successful with both a external and internal biliary drain placed. -- Medical oncology, Dr. Autumn following; appreciate assistance -- IR following, planning reattempt at external biliary drain placement today, n.p.o. -- AST 116>>79>>75>73>69>44 -- ALT 128>>114>88>81>70>53 -- Tbili 2.8>>8.8>6.7>8.4>7.9>5.6 -- Continue to monitor external biliary drain output, 250 mL past 24 hours -- Repeat CMP in a.m.  Anemia of chronic medical disease -- Hgb 9.3>>8.5>>7.5>7.2 -- Transfuse 1 unit PRBC today -- CBC in a.m.  Left portal vein thrombosis Discussed with medical oncology, Dr. Autumn; recommending start low-dose Eliquis given anemia. -- Eliquis 2.5 m p.o. twice daily  Pain of malignancy -- MS Contin  50 mg p.o. every 12 hours -- Oxycodone  10-50 mg p.o. every 4 hours.  Moderate pain -- Morphine  2 mg IV every 2 hours as needed severe pain -- Robaxin  500 mg IV every 6 hours as needed muscle spasms  Constipation -- Senna twice daily -- Lactulose 20 g p.o. twice daily -- MiraLAX  twice daily -- Bisacodyl  suppository 10 mg PR daily as needed moderate constipation  Urinary retention -- Foley catheter placed  HTN -- Amlodipine 5 mg p.o. daily -- Hold home losartan and spironolactone  for now  COPD Asthma -- Breztri  2 puffs twice daily -- Adderall neb every 2 hours.  Wheezing/shortness of breath  GERD -- Protonix  40 mg p.o. twice daily (substituted for home omeprazole )  HLD -- Hold home Crestor  for now given elevated LFTs  Prediabetes Hemoglobin A1c 5.6 on 12/22/2023  DVT prophylaxis: apixaban (ELIQUIS) tablet 2.5 mg Start: 05/19/24 1300 SCDs Start: 05/11/24 1814 apixaban (ELIQUIS) tablet 2.5 mg    Code Status: Limited: Do not attempt resuscitation (DNR) -DNR-LIMITED -Do Not Intubate/DNI  Family Communication: Updated family present at bedside this morning  Disposition Plan:  Level of care: Med-Surg Status is: Inpatient Remains inpatient appropriate because: Monitoring of  LFTs, transfuse 1 unit PBC today, anticipate discharge home tomorrow    Consultants:  [facility], Dr. Charlanne Interventional radiology Medical oncology, Dr. Autumn  Procedures:  Attempted PTC with drain placement, IR 11/1 External/internal biliary drain placement, IR 11/5  Antimicrobials:  Perioperative cefoxitin 11/1, 11/5   Subjective: Patient seen examined bedside, lying in bed.  Family present.  LFTs improved.  Will transfuse 1 unit PRBC today.  Seen by interventional radiology with instructions given for tube maintenance.  Discussed plan discharge home tomorrow.  No specific complaints this morning.  Denies headache, no dizziness, no chest pain, no shortness of breath, no abdominal pain, no fever/chills/night sweats, no nausea/vomiting/diarrhea, no focal weakness, no fatigue, no paresthesias.  No acute events overnight per nursing staff.  Objective: Vitals:   05/19/24 1920 05/19/24 2033 05/19/24 2100 05/20/24 0436  BP:  (!) 147/69  135/73  Pulse:  93  99  Resp:  16  16  Temp:  99.8 F (37.7 C) 98.6 F (37 C) 98.6 F (37 C)  TempSrc:  Oral Oral Oral  SpO2: 98% 98%  98%  Weight:      Height:        Intake/Output Summary (Last 24 hours) at 05/20/2024 1316 Last data filed at 05/20/2024 9057 Gross per 24 hour  Intake 730 ml  Output 1350 ml  Net -620 ml   Filed Weights   05/11/24 2101  Weight: 72.1 kg    Examination:  Physical Exam: GEN: NAD, alert and oriented x 3, + jaundice HEENT: NCAT, PERRL, EOMI, sclera clear, MMM PULM: CTAB w/o wheezes/crackles, normal respiratory effort, on room air CV: RRR w/o M/G/R GI: abd soft, NTND, NABS, no R/G/M, noted right sided abdominal drain in place with bilious fluid in collection bag GU: Noted Foley catheter in place MSK: no peripheral edema, moves all extremities independently NEURO: No focal neurologic deficit PSYCH: normal mood/affect Integumentary: + jaundice, otherwise no other concerning rashes/lesions/wounds noted  exposed skin surfaces    Data Reviewed: I have personally reviewed following labs and imaging studies  CBC: Recent Labs  Lab 05/15/24 0441 05/16/24 0505 05/17/24 0759 05/19/24 0520 05/20/24 0500  WBC 7.7 6.8 7.1 6.3 7.3  HGB 8.5* 7.7* 8.0* 7.5* 7.2*  HCT 26.7* 24.0* 24.9* 23.4* 22.6*  MCV 101.1* 100.8* 102.0* 104.5* 105.1*  PLT 125* 108* 124* 113* 118*   Basic Metabolic Panel: Recent Labs  Lab 05/15/24 0441 05/16/24 0505 05/17/24 0759 05/19/24 0520 05/20/24 0500  NA 136 137 137 137 137  K 3.6 4.0 4.2 3.7 3.4*  CL 105 105 104 107 106  CO2 22 23 22  21* 20*  GLUCOSE 123* 111* 121* 110* 107*  BUN 10 9 9 12 11   CREATININE 0.82 0.73 0.79 0.80 0.67  CALCIUM  8.5* 8.5* 8.5* 8.7* 8.5*   GFR: Estimated Creatinine Clearance: 54.8 mL/min (by C-G formula based on SCr of 0.67 mg/dL). Liver Function Tests: Recent Labs  Lab 05/15/24 0441 05/16/24 0505 05/17/24 0759 05/19/24 0520 05/20/24 0500  AST 102* 75* 73* 69* 44*  ALT 114* 88* 81* 70* 53*  ALKPHOS 217* 206* 227* 223* 192*  BILITOT 8.8* 6.7* 8.4* 7.9* 5.6*  PROT 5.3* 5.1* 5.5* 5.3* 5.3*  ALBUMIN  3.1* 2.9* 2.9* 2.8* 2.9*   No results for input(s): LIPASE, AMYLASE in the last 168 hours. No results for input(s): AMMONIA in the last 168 hours. Coagulation Profile: Recent Labs  Lab 05/14/24 0420  INR  1.2   Cardiac Enzymes: No results for input(s): CKTOTAL, CKMB, CKMBINDEX, TROPONINI in the last 168 hours. BNP (last 3 results) No results for input(s): PROBNP in the last 8760 hours. HbA1C: No results for input(s): HGBA1C in the last 72 hours. CBG: No results for input(s): GLUCAP in the last 168 hours. Lipid Profile: No results for input(s): CHOL, HDL, LDLCALC, TRIG, CHOLHDL, LDLDIRECT in the last 72 hours. Thyroid  Function Tests: No results for input(s): TSH, T4TOTAL, FREET4, T3FREE, THYROIDAB in the last 72 hours. Anemia Panel: No results for input(s): VITAMINB12,  FOLATE, FERRITIN, TIBC, IRON, RETICCTPCT in the last 72 hours. Sepsis Labs: No results for input(s): PROCALCITON, LATICACIDVEN in the last 168 hours.  No results found for this or any previous visit (from the past 240 hours).       Radiology Studies: IR BILIARY DRAIN PLACEMENT WITH CHOLANGIOGRAM Result Date: 05/18/2024 INDICATION: 78 year old with gallbladder cancer, jaundice and elevated bilirubin. Attempted percutaneous biliary drain placement on 05/14/2024 was unsuccessful. Plan to re-attempt biliary drain placement. EXAM: 1. Percutaneous transhepatic cholangiogram using ultrasound and fluoroscopic guidance 2. Placement of internal/external biliary drain 3. Ultrasound-guided paracentesis MEDICATIONS: Cefoxitin 2 g; The antibiotic was administered within an appropriate time frame prior to the initiation of the procedure. ANESTHESIA/SEDATION: Moderate (conscious) sedation was employed during this procedure. A total of Versed  8 mg and Fentanyl  200 mcg and Dilaudid  1 mg was administered intravenously by the radiology nurse. Total intra-service moderate Sedation Time: 70 minutes. The patient's level of consciousness and vital signs were monitored continuously by radiology nursing throughout the procedure under my direct supervision. FLUOROSCOPY: Radiation Exposure Index (as provided by the fluoroscopic device): 169 mGy Kerma CONTRAST:  30 mL Omnipaque  300 COMPLICATIONS: None immediate. PROCEDURE: Informed written consent was obtained from the patient after a thorough discussion of the procedural risks, benefits and alternatives. All questions were addressed. A timeout was performed prior to the initiation of the procedure. The anterior and right side of the abdomen were prepped and draped in sterile fashion. Maximal barrier sterile technique was utilized including caps, mask, sterile gowns, sterile gloves, sterile drape, hand hygiene and skin antiseptic. Ultrasound demonstrated a small amount  of perihepatic ascites that was new. Skin was anesthetized with 1% lidocaine . Using ultrasound guidance, a Yueh catheter was directed in the perihepatic ascites. Paracentesis was performed and 280 mL of amber fluid was removed. Yueh catheter was removed. Multiple attempts were made to cannulate a biliary duct in both the left and right hepatic lobes using ultrasound and fluoroscopic guidance. Procedure was technically difficult due to the minimal intrahepatic biliary dilatation. Eventually, a bile duct in the right hepatic lobe was cannulated and opacified with contrast. 0.018 wire was successfully advanced into the biliary system and an Accustick dilator set was placed. The Accustick dilator set was exchanged for a Kumpe catheter over a J wire. Subsequently, a Glidewire was used to traverse the central biliary obstruction. Catheter and wire advanced into the common bile duct and duodenum. Superstiff Amplatz wire was placed. The tract was dilated over the wire. Initially, a 10 French biliary drain was advanced over the wire but the drain would not traverse the central biliary obstruction. Therefore, an 8.5 French biliary drain was advanced over the wire. This drain was successfully advanced across the obstruction and into the duodenum. Contrast injection confirmed appropriate placement. Drain was flushed with saline and attached to a gravity bag. Drain was sutured to skin. Fluoroscopic and ultrasound images were taken and saved for documentation. FINDINGS: Ultrasound demonstrated a  small amount of perihepatic ascites. Paracentesis was performed using ultrasound guidance. 280 mL of amber colored fluid was removed. A mildly dilated bile duct in the posterior right hepatic lobe was successfully cannulated and opacified with contrast. Additional right hepatic ducts were opacified with contrast. Central biliary obstruction was identified. No significant filling of left intrahepatic bile ducts. Catheter and wire were  advanced through the central biliary obstruction. Biliary drain was advanced into the duodenum. IMPRESSION: 1. Successful percutaneous transhepatic cholangiogram and placement of internal/external biliary drain. Biliary drain was placed via a right hepatic bile duct. 2. Ultrasound-guided paracentesis yielding 280 mL of fluid. Electronically Signed   By: Juliene Balder M.D.   On: 05/18/2024 20:31   IR Paracentesis Result Date: 05/18/2024 INDICATION: 78 year old with gallbladder cancer, jaundice and elevated bilirubin. Attempted percutaneous biliary drain placement on 05/14/2024 was unsuccessful. Plan to re-attempt biliary drain placement. EXAM: 1. Percutaneous transhepatic cholangiogram using ultrasound and fluoroscopic guidance 2. Placement of internal/external biliary drain 3. Ultrasound-guided paracentesis MEDICATIONS: Cefoxitin 2 g; The antibiotic was administered within an appropriate time frame prior to the initiation of the procedure. ANESTHESIA/SEDATION: Moderate (conscious) sedation was employed during this procedure. A total of Versed  8 mg and Fentanyl  200 mcg and Dilaudid  1 mg was administered intravenously by the radiology nurse. Total intra-service moderate Sedation Time: 70 minutes. The patient's level of consciousness and vital signs were monitored continuously by radiology nursing throughout the procedure under my direct supervision. FLUOROSCOPY: Radiation Exposure Index (as provided by the fluoroscopic device): 169 mGy Kerma CONTRAST:  30 mL Omnipaque  300 COMPLICATIONS: None immediate. PROCEDURE: Informed written consent was obtained from the patient after a thorough discussion of the procedural risks, benefits and alternatives. All questions were addressed. A timeout was performed prior to the initiation of the procedure. The anterior and right side of the abdomen were prepped and draped in sterile fashion. Maximal barrier sterile technique was utilized including caps, mask, sterile gowns, sterile  gloves, sterile drape, hand hygiene and skin antiseptic. Ultrasound demonstrated a small amount of perihepatic ascites that was new. Skin was anesthetized with 1% lidocaine . Using ultrasound guidance, a Yueh catheter was directed in the perihepatic ascites. Paracentesis was performed and 280 mL of amber fluid was removed. Yueh catheter was removed. Multiple attempts were made to cannulate a biliary duct in both the left and right hepatic lobes using ultrasound and fluoroscopic guidance. Procedure was technically difficult due to the minimal intrahepatic biliary dilatation. Eventually, a bile duct in the right hepatic lobe was cannulated and opacified with contrast. 0.018 wire was successfully advanced into the biliary system and an Accustick dilator set was placed. The Accustick dilator set was exchanged for a Kumpe catheter over a J wire. Subsequently, a Glidewire was used to traverse the central biliary obstruction. Catheter and wire advanced into the common bile duct and duodenum. Superstiff Amplatz wire was placed. The tract was dilated over the wire. Initially, a 10 French biliary drain was advanced over the wire but the drain would not traverse the central biliary obstruction. Therefore, an 8.5 French biliary drain was advanced over the wire. This drain was successfully advanced across the obstruction and into the duodenum. Contrast injection confirmed appropriate placement. Drain was flushed with saline and attached to a gravity bag. Drain was sutured to skin. Fluoroscopic and ultrasound images were taken and saved for documentation. FINDINGS: Ultrasound demonstrated a small amount of perihepatic ascites. Paracentesis was performed using ultrasound guidance. 280 mL of amber colored fluid was removed. A mildly dilated bile duct  in the posterior right hepatic lobe was successfully cannulated and opacified with contrast. Additional right hepatic ducts were opacified with contrast. Central biliary obstruction was  identified. No significant filling of left intrahepatic bile ducts. Catheter and wire were advanced through the central biliary obstruction. Biliary drain was advanced into the duodenum. IMPRESSION: 1. Successful percutaneous transhepatic cholangiogram and placement of internal/external biliary drain. Biliary drain was placed via a right hepatic bile duct. 2. Ultrasound-guided paracentesis yielding 280 mL of fluid. Electronically Signed   By: Juliene Balder M.D.   On: 05/18/2024 20:31        Scheduled Meds:  sodium chloride    Intravenous Once   amLODipine  5 mg Oral Daily   apixaban  2.5 mg Oral BID   budesonide -glycopyrrolate -formoterol   2 puff Inhalation BID   Chlorhexidine  Gluconate Cloth  6 each Topical Daily   docusate sodium   100 mg Oral BID   feeding supplement  237 mL Oral BID BM   lactulose  20 g Oral BID   morphine   15 mg Oral Q12H   pantoprazole   40 mg Oral BID   polyethylene glycol  17 g Oral BID   senna  1 tablet Oral BID   sodium chloride  flush  5 mL Intracatheter Q8H   sodium phosphate   1 enema Rectal Once   Continuous Infusions:     LOS: 8 days    Time spent: 49 minutes spent on 05/20/2024 caring for this patient face-to-face including chart review, ordering labs/tests, documenting, discussion with nursing staff, consultants, updating family and interview/physical exam    Camellia PARAS Leonela Kivi, DO [facility] Available via Epic secure chat 7am-7pm After these hours, please refer to coverage provider listed on [email or web address] 05/20/2024, 1:16 PM

## 2024-05-20 NOTE — Evaluation (Signed)
 Physical Therapy Evaluation Patient Details Name: Janice Pace MRN: [id number] DOB: 1945/09/14 Today's Date: 05/20/2024  History of Present Illness  Pt is 78 yo female admitted on 05/11/24 with obstructive jaundice with stage IV gallbladder carcinoma w peritoneal, lung, and hepatic metastasis.  Pt s/p biliary drain placement on 05/18/24.  Pt also with L portal vein thrombosis started on Eliquis. Pt with hx including but not limited to stage IV gallbladder carcinoma  Clinical Impression  Pt admitted with above diagnosis. At baseline, pt is independent.  She has RW and support at home.  Today, PT with imminent d/c orders, pt to receive unit PRBC but not until later and hgb similar to yesterday, so proceeded with PT eval.  Upon standing pt with syncopal symptoms and found to have orthostatic hypotension , she was unable to tolerate standing for 3 mins.  Pt moving with CGA up until that point and noted that pt has been ambulating in hallway with staff.  Expect that pt will be able to progress once orthostatic hypotension improves.  Recommend HHPT and family supervision once discharged.  Did discuss checking orthostatic vitals with nursing after receives PRBC.  Pt currently with functional limitations due to the deficits listed below (see PT Problem List). Pt will benefit from acute skilled PT to increase their independence and safety with mobility to allow discharge.           If plan is discharge home, recommend the following: A little help with walking and/or transfers;A little help with bathing/dressing/bathroom;Assistance with cooking/housework;Help with stairs or ramp for entrance   Can travel by private vehicle        Equipment Recommendations None recommended by PT  Recommendations for Other Services       Functional Status Assessment Patient has had a recent decline in their functional status and demonstrates the ability to make significant improvements in function in a reasonable and  predictable amount of time.     Precautions / Restrictions Precautions Precautions: Fall      Mobility  Bed Mobility Overal bed mobility: Needs Assistance Bed Mobility: Supine to Sit, Sit to Supine     Supine to sit: Supervision Sit to supine: Supervision        Transfers Overall transfer level: Needs assistance Equipment used: Rolling walker (2 wheels) Transfers: Sit to/from Stand Sit to Stand: Contact guard assist                Ambulation/Gait Ambulation/Gait assistance: Contact guard assist Gait Distance (Feet): 6 Feet Assistive device: Rolling walker (2 wheels) Gait Pattern/deviations: Step-to pattern, Decreased stride length Gait velocity: decreased     General Gait Details: Only able to take a few steps at bedside limited by syncopal symptoms with orthostatic hypotension.  Did note pt ambulated 125' yesterday with staff.  Stairs            Wheelchair Mobility     Tilt Bed    Modified Rankin (Stroke Patients Only)       Balance Overall balance assessment: Needs assistance Sitting-balance support: No upper extremity supported Sitting balance-Leahy Scale: Good     Standing balance support: No upper extremity supported Standing balance-Leahy Scale: Fair                               Pertinent Vitals/Pain Pain Assessment Pain Assessment: Faces Faces Pain Scale: Hurts a little bit Pain Location: soreness at drain site Pain Descriptors / Indicators: Discomfort  Pain Intervention(s): Limited activity within patient's tolerance, Monitored during session, Repositioned    Home Living Family/patient expects to be discharged to:: Private residence Living Arrangements: Spouse/significant other Available Help at Discharge: Family;Available 24 hours/day (other family nearby) Type of Home: House Home Access: Stairs to enter Entrance Stairs-Rails: Left (and can use door on R) Entrance Stairs-Number of Steps: 2   Home Layout: One  level Home Equipment: BSC/3in1;Rolling Walker (2 wheels);Cane - single point (bidet)      Prior Function Prior Level of Function : Independent/Modified Independent;Driving             Mobility Comments: could ambulate in community; recently using cane ADLs Comments: ind adls and iadls     Extremity/Trunk Assessment   Upper Extremity Assessment Upper Extremity Assessment: Generalized weakness    Lower Extremity Assessment Lower Extremity Assessment: Generalized weakness    Cervical / Trunk Assessment Cervical / Trunk Assessment: Normal  Communication        Cognition Arousal: Alert Behavior During Therapy: WFL for tasks assessed/performed   PT - Cognitive impairments: No apparent impairments                                 Cueing       General Comments General comments (skin integrity, edema, etc.): Noted that pt to received PRBC today.  Hgb has been trending 7-8 and is 7.2 today.  RN reports probably not getting until afternoon so PT saw pt prior to getting PRBC as pt with imminent d/c orders.  Pt reports feeling tired and weak.  BP was 120/73 in sitting dropping to 87/51 with initial stand with lightheaded symptoms.  Had pt take steps back and forth at EOB to assist wtih blood flow but syncopal symptoms worsening with lightheaded, nausea, weakness, and had to return to laying.  BP up to 102/56 by time taken but suspect had dropped further in standing.  Notified nursing.  Pt feeling better in supine.  Discussed with family that pt moving with CGA and noted ambulated yesterday, limited today due to orthostatic hypotension which hopefully will improve after PRBC.  Discussed that nursing able to monitor BP with activity after PRBC to see if improves.  If BP stable and does return home recommend use of RW, supervision, and HHPT.  They are in agreement.    Exercises     Assessment/Plan    PT Assessment Patient needs continued PT services  PT Problem List  Decreased strength;Decreased mobility;Decreased activity tolerance;Decreased balance;Decreased knowledge of use of DME;Cardiopulmonary status limiting activity       PT Treatment Interventions DME instruction;Therapeutic exercise;Gait training;Functional mobility training;Therapeutic activities;Patient/family education;Modalities;Balance training    PT Goals (Current goals can be found in the Care Plan section)  Acute Rehab PT Goals Patient Stated Goal: return home PT Goal Formulation: With patient/family Time For Goal Achievement: 06/03/24 Potential to Achieve Goals: Good    Frequency Min 2X/week     Co-evaluation               AM-PAC PT 6 Clicks Mobility  Outcome Measure Help needed turning from your back to your side while in a flat bed without using bedrails?: None Help needed moving from lying on your back to sitting on the side of a flat bed without using bedrails?: None Help needed moving to and from a bed to a chair (including a wheelchair)?: A Little Help needed standing up from a chair using  your arms (e.g., wheelchair or bedside chair)?: A Little Help needed to walk in hospital room?: A Little Help needed climbing 3-5 steps with a railing? : A Little 6 Click Score: 20    End of Session Equipment Utilized During Treatment: Gait belt Activity Tolerance: Treatment limited secondary to medical complications (Comment) Patient left: in bed;with call bell/phone within reach;with family/visitor present (family request alarm off as they help her to bsc) Nurse Communication: Mobility status PT Visit Diagnosis: Other abnormalities of gait and mobility (R26.89);Muscle weakness (generalized) (M62.81)    Time: 8884-8851 PT Time Calculation (min) (ACUTE ONLY): 33 min   Charges:   PT Evaluation $PT Eval Low Complexity: 1 Low PT Treatments $Therapeutic Activity: 8-22 mins PT General Charges $$ ACUTE PT VISIT: 1 Visit         Janice, PT Acute Rehab [facility] [phone]   Janice Pace 05/20/2024, 1:33 PM

## 2024-05-20 NOTE — Discharge Summary (Addendum)
 Physician Discharge Summary  AZIZA STUCKERT FMW:[id number] DOB: [birth year] DOA: 05/11/2024  PCP: Gayle Saddie FALCON, PA-C  Admit date: 05/11/2024 Discharge date: 05/21/2024  Admitted From: Home Disposition: Home  Recommendations for Outpatient Follow-up:  Follow up with PCP in 1-2 weeks Follow-up with medical oncology, Dr. Autumn as scheduled Outpatient follow-up with interventional radiology drain clinic Please obtain CMP/CBC in one week for interval monitoring of hemoglobin, LFTs/total bilirubin  Home Health: No Equipment/Devices: None  Discharge Condition: Stable CODE STATUS: DNR Diet recommendation: Heart healthy diet  History of present illness:  Janice Pace is a 78 y.o. female with past medical history significant for stage IV gallbladder carcinoma with peritoneal and lung metastasis currently on immunotherapy, COPD, asthma, GERD, prediabetes who presented to [facility] ED on 05/11/2024 with complaints of abdominal pain.  Pain reported as a cramping sensation, difficulty with urination and bowel movement.   In the ED, temperature 98.6 F, HR 80, RR 18, BP 171/100, SpO2 100% on room air.  WBC 6.3, hemoglobin 9.3, platelet count 125.  Sodium 141, Tessman 3.3, chloride 106, CO2 23, glucose 95, BUN 16, creat 1.36.  AST 116, ALT 128, total bilirubin 2.8.  Urinalysis unrevealing.  CT abdomen/pelvis with contrast with no evidence of bowel obstruction, noted hepatic metastatic disease with suspected new hepatic lesions in both lobes, stable changes to gallbladder cancer with involvement of left hepatic lobe, scattered omental implants near liver and transverse colon, stable cystic solid mass central pelvis measuring 4.8 cm.  TRH consulted for admission for further evaluation management of suspected obstructive jaundice with imaging evidence of metastatic disease progression.  Hospital course:  Obstructive jaundice Stage IV gallbladder carcinoma with peritoneal, lung, hepatic  metastasis; progressive Patient presenting to the ED with abdominal pain.  CT abdomen/pelvis, MRCP with findings of worsening disease progression with multiple new liver lesions.  Suspect disease burden likely causing biliary obstruction and jaundice.  Evaluated by gastroenterology, due to location unable to assist with stenting or intervention and recommended IR for external biliary drain placement.  Seen by IR on 05/14/2024, unfortunately not successful.  Patient underwent reattempt of biliary drain placement by IR on 05/18/2024 which was successful with both a external and internal biliary drain placed.  Following drain placement, LFTs and low bilirubin slowly trending down, AST 44, AST 53, total bilirubin 5.6 at time of discharge.  Outpatient follow-up with medical oncology, interventional radiology drain clinic.  Recommend repeat CMP 1 week.  Patient to monitor drain output, flush once daily.  Anemia of chronic medical disease Transfuse 1 unit PRBC during hospitalization.  Hemoglobin 9.3 at time of discharge.  Recommend repeat CBC 1 week.   Left portal vein thrombosis Discussed with medical oncology, Dr. Autumn; recommending start low-dose Eliquis given anemia. Eliquis 2.5 m p.o. twice daily.  Will discontinue home aspirin    Pain of malignancy MS Contin  15 mg p.o. every 12 hours, oxycodone  10-15 mg p.o. every 4 hours as needed moderate breakthrough pain.   Constipation Senna twice daily, Lactulose 20 g p.o. twice daily   Urinary retention: Resolved Foley catheter removed at time of discharge, was able to void without issue.   HTN Continue metoprolol  succinate, losartan, spironolactone     COPD Asthma Breztri  2 puffs twice daily   GERD Continue omeprazole    HLD May resume Crestor  on discharge as LFTs normalized.  Repeat CMP 1 week.  Follow-up with PCP.   Prediabetes Hemoglobin A1c 5.6 on 12/22/2023  Discharge Diagnoses:  Active Problems:   Carcinoma of gall  bladder (HCC)    Abdominal pain   LFT elevation   Abnormal CT of the abdomen    Discharge Instructions  Discharge Instructions     Call MD for:  difficulty breathing, headache or visual disturbances   Complete by: As directed    Call MD for:  extreme fatigue   Complete by: As directed    Call MD for:  persistant dizziness or light-headedness   Complete by: As directed    Call MD for:  persistant nausea and vomiting   Complete by: As directed    Call MD for:  severe uncontrolled pain   Complete by: As directed    Call MD for:  temperature >100.4   Complete by: As directed    Diet - low sodium heart healthy   Complete by: As directed    Discharge wound care:   Complete by: As directed    Dressing changes daily surrounding drain site   Increase activity slowly   Complete by: As directed       Allergies as of 05/21/2024       Reactions   Sulfa Antibiotics Hives, Itching   Forteo  [parathyroid Hormone (recomb)] Other (See Comments)   Too many side effects   Atorvastatin Other (See Comments)   Muscle aches   Metformin Hcl Other (See Comments)   Joint pains and muscle aches        Medication List     STOP taking these medications    aspirin  EC 81 MG tablet   dexamethasone  4 MG tablet Commonly known as: DECADRON    methylPREDNISolone  4 MG Tbpk tablet Commonly known as: MEDROL  DOSEPAK       TAKE these medications    albuterol  108 (90 Base) MCG/ACT inhaler Commonly known as: VENTOLIN  HFA Inhale 1-2 puffs into the lungs every 6 (six) hours as needed for wheezing or shortness of breath.   apixaban 2.5 MG Tabs tablet Commonly known as: ELIQUIS Take 1 tablet (2.5 mg total) by mouth 2 (two) times daily.   Breztri  Aerosphere 160-9-4.8 MCG/ACT Aero inhaler Generic drug: budesonide -glycopyrrolate -formoterol  Inhale 2 puffs into the lungs in the morning and at bedtime.   docusate sodium  100 MG capsule Commonly known as: COLACE Take 1 capsule (100 mg total) by mouth 2 (two)  times daily. What changed:  how much to take when to take this   fluconazole  200 MG tablet Commonly known as: DIFLUCAN  Take 1 tablet (200 mg total) by mouth daily.   lactulose 10 GM/15ML solution Commonly known as: CHRONULAC Take 30 mLs (20 g total) by mouth 2 (two) times daily.   lidocaine -prilocaine  cream Commonly known as: EMLA  Apply to affected area once What changed:  how much to take how to take this when to take this reasons to take this additional instructions   loratadine  10 MG tablet Commonly known as: CLARITIN  Take 10 mg by mouth See admin instructions. Take 10 mg by mouth once a day on non-chemo weeks   losartan 50 MG tablet Commonly known as: COZAAR Take 1 tablet (50 mg total) by mouth daily.   magic mouthwash (nystatin, lidocaine , diphenhydrAMINE , alum & mag hydroxide) suspension Take 5 mLs by mouth every 4 (four) hours as needed for mouth pain. Suspension contains equal amounts of Maalox, nystatin, diphenhydramine  and lidocaine . 1:1:1:1 ratio.   magnesium  oxide 400 (240 Mg) MG tablet Commonly known as: MAG-OX Take 1 tablet (400 mg total) by mouth 2 (two) times daily.   megestrol  625 MG/5ML suspension Commonly known as: MEGACE   ES Take 5 mLs (625 mg total) by mouth daily.   metoprolol  succinate 50 MG 24 hr tablet Commonly known as: TOPROL -XL TAKE 1 TABLET BY MOUTH ONCE DAILY. TAKE WITH OR IMMEDIATELY FOLLOWING A MEAL What changed: See the new instructions.   morphine  15 MG 12 hr tablet Commonly known as: MS CONTIN  Take 1 tablet (15 mg total) by mouth every 12 (twelve) hours.   Normal Saline Flush 0.9 % Soln Flush abdominal drain with 5 ml(cc) sterile saline once daily   omeprazole  20 MG capsule Commonly known as: PRILOSEC Take 1 capsule (20 mg total) by mouth daily. What changed:  when to take this additional instructions   ondansetron  8 MG tablet Commonly known as: Zofran  Take 1 tablet (8 mg total) by mouth every 8 (eight) hours as needed  for nausea or vomiting. Start on the third day after cisplatin . What changed: Another medication with the same name was removed. Continue taking this medication, and follow the directions you see here.   Oxycodone  HCl 10 MG Tabs Take 1-1.5 tablets (10-15 mg total) by mouth every 4 (four) hours as needed for moderate pain (pain score 4-6) or severe pain (pain score 7-10).   polyethylene glycol 17 g packet Commonly known as: MIRALAX  / GLYCOLAX  Take 17 g by mouth 2 (two) times daily as needed for mild constipation or moderate constipation.   prochlorperazine  10 MG tablet Commonly known as: COMPAZINE  Take 1 tablet (10 mg total) by mouth every 6 (six) hours as needed (Nausea or vomiting).   rosuvastatin  10 MG tablet Commonly known as: CRESTOR  Take 1 tablet by mouth once daily What changed: when to take this   spironolactone  25 MG tablet Commonly known as: ALDACTONE  TAKE 1 TABLET BY MOUTH ONCE DAILY IN THE MORNING               Discharge Care Instructions  (From admission, onward)           Start     Ordered   05/21/24 0000  Discharge wound care:       Comments: Dressing changes daily surrounding drain site   05/21/24 0956            Follow-up Information     Gayle Saddie FALCON, PA-C. Schedule an appointment as soon as possible for a visit in 1 week(s).   Specialty: Physician Assistant Contact information: [address] [place] [address] [phone]         [facility] Follow up.   Contact information: [address] [place] [address] [phone]         Autumn Millman, MD. Schedule an appointment as soon as possible for a visit.   Specialty: Oncology Contact information: [address] [place] [address] [phone]                Allergies  Allergen Reactions   Sulfa Antibiotics Hives and Itching   Forteo  [Parathyroid Hormone (Recomb)] Other (See Comments)    Too many side  effects   Atorvastatin Other (See Comments)    Muscle aches   Metformin Hcl Other (See Comments)    Joint pains and muscle aches    Consultations:  GI, Dr. Charlanne Interventional radiology Medical oncology, Dr. Autumn   Subjective: Patient seen examined bedside, lying in bed.  Family present.  LFTs have normalized, bilirubin remains elevated but improved.  Discharging home today.  Has outpatient follow-up scheduled with oncology this upcoming week for repeat  labs.  No other questions, concerns, or complaints at this time.  Denies headache, no dizziness, no chest pain, no palpitations, no shortness of breath, no abdominal pain, no fever/chills/night sweats, no nausea/vomiting/diarrhea, no focal weakness, no fatigue, no paresthesias.  No acute events overnight per nursing.  Discharge Exam: Vitals:   05/21/24 0522 05/21/24 0840  BP: (!) 140/72   Pulse: (!) 105   Resp: 16   Temp: 98.8 F (37.1 C)   SpO2: 96% 96%   Vitals:   05/20/24 2036 05/20/24 2124 05/21/24 0522 05/21/24 0840  BP:  (!) 181/74 (!) 140/72   Pulse:  (!) 106 (!) 105   Resp:  16 16   Temp:  98.2 F (36.8 C) 98.8 F (37.1 C)   TempSrc:  Oral Oral   SpO2: 96% 96% 96% 96%  Weight:      Height:        Physical Exam: GEN: NAD, alert and oriented x 3, + jaundice HEENT: NCAT, PERRL, EOMI, sclera clear, MMM PULM: CTAB w/o wheezes/crackles, normal respiratory effort, on room air CV: RRR w/o M/G/R GI: abd soft, NTND, + BS, noted right sided abdominal drain in place with bilious fluid in collection bag MSK: no peripheral edema, moves all extremities independently NEURO: No focal neurologic deficit PSYCH: normal mood/affect Integumentary: + jaundice, otherwise no other concerning rashes/lesions/wounds noted exposed skin surfaces    The results of significant diagnostics from this hospitalization (including imaging, microbiology, ancillary and laboratory) are listed below for reference.     Microbiology: No  results found for this or any previous visit (from the past 240 hours).   Labs: BNP (last 3 results) No results for input(s): BNP in the last 8760 hours. Basic Metabolic Panel: Recent Labs  Lab 05/16/24 0505 05/17/24 0759 05/19/24 0520 05/20/24 0500 05/21/24 0540  NA 137 137 137 137 136  K 4.0 4.2 3.7 3.4* 3.9  CL 105 104 107 106 107  CO2 23 22 21* 20* 21*  GLUCOSE 111* 121* 110* 107* 110*  BUN 9 9 12 11 12   CREATININE 0.73 0.79 0.80 0.67 0.79  CALCIUM  8.5* 8.5* 8.7* 8.5* 8.8*   Liver Function Tests: Recent Labs  Lab 05/16/24 0505 05/17/24 0759 05/19/24 0520 05/20/24 0500 05/21/24 0540  AST 75* 73* 69* 44* 32  ALT 88* 81* 70* 53* 42  ALKPHOS 206* 227* 223* 192* 168*  BILITOT 6.7* 8.4* 7.9* 5.6* 6.9*  PROT 5.1* 5.5* 5.3* 5.3* 5.5*  ALBUMIN  2.9* 2.9* 2.8* 2.9* 2.9*   No results for input(s): LIPASE, AMYLASE in the last 168 hours. No results for input(s): AMMONIA in the last 168 hours. CBC: Recent Labs  Lab 05/16/24 0505 05/17/24 0759 05/19/24 0520 05/20/24 0500 05/21/24 0540  WBC 6.8 7.1 6.3 7.3 8.7  HGB 7.7* 8.0* 7.5* 7.2* 9.3*  HCT 24.0* 24.9* 23.4* 22.6* 27.9*  MCV 100.8* 102.0* 104.5* 105.1* 98.6  PLT 108* 124* 113* 118* 116*   Cardiac Enzymes: No results for input(s): CKTOTAL, CKMB, CKMBINDEX, TROPONINI in the last 168 hours. BNP: Invalid input(s): POCBNP CBG: No results for input(s): GLUCAP in the last 168 hours. D-Dimer No results for input(s): DDIMER in the last 72 hours. Hgb A1c No results for input(s): HGBA1C in the last 72 hours. Lipid Profile No results for input(s): CHOL, HDL, LDLCALC, TRIG, CHOLHDL, LDLDIRECT in the last 72 hours. Thyroid  function studies No results for input(s): TSH, T4TOTAL, T3FREE, THYROIDAB in the last 72 hours.  Invalid input(s): FREET3 Anemia work up No results for input(s):  VITAMINB12, FOLATE, FERRITIN, TIBC, IRON, RETICCTPCT in the last 72  hours. Urinalysis    Component Value Date/Time   COLORURINE YELLOW 05/11/2024 1833   APPEARANCEUR CLEAR 05/11/2024 1833   LABSPEC 1.028 05/11/2024 1833   PHURINE 6.0 05/11/2024 1833   GLUCOSEU NEGATIVE 05/11/2024 1833   HGBUR NEGATIVE 05/11/2024 1833   BILIRUBINUR NEGATIVE 05/11/2024 1833   BILIRUBINUR small (A) 09/23/2023 1109   BILIRUBINUR negative 08/26/2023 0944   KETONESUR NEGATIVE 05/11/2024 1833   PROTEINUR NEGATIVE 05/11/2024 1833   UROBILINOGEN 2.0 (A) 09/23/2023 1109   NITRITE NEGATIVE 05/11/2024 1833   LEUKOCYTESUR NEGATIVE 05/11/2024 1833   Sepsis Labs Recent Labs  Lab 05/17/24 0759 05/19/24 0520 05/20/24 0500 05/21/24 0540  WBC 7.1 6.3 7.3 8.7   Microbiology No results found for this or any previous visit (from the past 240 hours).   Time coordinating discharge: Over 30 minutes  SIGNED:   Camellia PARAS Amahri Dengel, DO  [facility] 05/21/2024, 9:59 AM

## 2024-05-21 ENCOUNTER — Other Ambulatory Visit (HOSPITAL_COMMUNITY): Payer: Self-pay

## 2024-05-21 ENCOUNTER — Encounter: Payer: Self-pay | Admitting: Oncology

## 2024-05-21 LAB — COMPREHENSIVE METABOLIC PANEL WITH GFR
ALT: 42 U/L (ref 0–44)
AST: 32 U/L (ref 15–41)
Albumin: 2.9 g/dL — ABNORMAL LOW (ref 3.5–5.0)
Alkaline Phosphatase: 168 U/L — ABNORMAL HIGH (ref 38–126)
Anion gap: 8 (ref 5–15)
BUN: 12 mg/dL (ref 8–23)
CO2: 21 mmol/L — ABNORMAL LOW (ref 22–32)
Calcium: 8.8 mg/dL — ABNORMAL LOW (ref 8.9–10.3)
Chloride: 107 mmol/L (ref 98–111)
Creatinine, Ser: 0.79 mg/dL (ref 0.44–1.00)
GFR, Estimated: >60 mL/min (ref >60)
Glucose, Bld: 110 mg/dL — ABNORMAL HIGH (ref 70–99)
Potassium: 3.9 mmol/L (ref 3.5–5.1)
Sodium: 136 mmol/L (ref 135–145)
Total Bilirubin: 6.9 mg/dL — ABNORMAL HIGH (ref 0.0–1.2)
Total Protein: 5.5 g/dL — ABNORMAL LOW (ref 6.5–8.1)

## 2024-05-21 LAB — CBC
HCT: 27.9 % — ABNORMAL LOW (ref 36.0–46.0)
Hemoglobin: 9.3 g/dL — ABNORMAL LOW (ref 12.0–15.0)
MCH: 32.9 pg (ref 26.0–34.0)
MCHC: 33.3 g/dL (ref 30.0–36.0)
MCV: 98.6 fL (ref 80.0–100.0)
Platelets: 116 K/uL — ABNORMAL LOW (ref 150–400)
RBC: 2.83 MIL/uL — ABNORMAL LOW (ref 3.87–5.11)
RDW: 22.7 % — ABNORMAL HIGH (ref 11.5–15.5)
WBC: 8.7 K/uL (ref 4.0–10.5)
nRBC: 0 % (ref 0.0–0.2)

## 2024-05-21 MED ORDER — APIXABAN 2.5 MG PO TABS
2.5000 mg | ORAL_TABLET | Freq: Two times a day (BID) | ORAL | 0 refills | Status: DC
  Filled 2024-05-21: qty 60, 30d supply, fill #0

## 2024-05-21 MED ORDER — HEPARIN SOD (PORK) LOCK FLUSH 100 UNIT/ML IV SOLN
500.0000 [IU] | Freq: Once | INTRAVENOUS | Status: AC
  Administered 2024-05-21: 500 [IU] via INTRAVENOUS
  Filled 2024-05-21: qty 5

## 2024-05-21 MED ORDER — LACTULOSE 10 GM/15ML PO SOLN
20.0000 g | Freq: Two times a day (BID) | ORAL | 0 refills | Status: DC
  Filled 2024-05-21: qty 236, 4d supply, fill #0

## 2024-05-21 MED ORDER — DOCUSATE SODIUM 100 MG PO CAPS
100.0000 mg | ORAL_CAPSULE | Freq: Two times a day (BID) | ORAL | 0 refills | Status: DC
  Filled 2024-05-21: qty 180, 90d supply, fill #0

## 2024-05-22 ENCOUNTER — Other Ambulatory Visit: Payer: Self-pay

## 2024-05-23 LAB — BPAM RBC
Blood Product Expiration Date: 202512012359
ISSUE DATE / TIME: 202511071330
Unit Type and Rh: 7300

## 2024-05-23 LAB — TYPE AND SCREEN
ABO/RH(D): B POS
Antibody Screen: NEGATIVE
Unit division: 0

## 2024-05-25 ENCOUNTER — Ambulatory Visit: Payer: Self-pay

## 2024-05-25 ENCOUNTER — Emergency Department (HOSPITAL_COMMUNITY)

## 2024-05-25 ENCOUNTER — Other Ambulatory Visit: Payer: Self-pay

## 2024-05-25 ENCOUNTER — Inpatient Hospital Stay (HOSPITAL_COMMUNITY)
Admission: EM | Admit: 2024-05-25 | Discharge: 2024-05-28 | DRG: 947 | Disposition: A | Discharge status: Hospice | Attending: Emergency Medicine | Admitting: Emergency Medicine

## 2024-05-25 DIAGNOSIS — C786 Secondary malignant neoplasm of retroperitoneum and peritoneum: Secondary | ICD-10-CM | POA: Diagnosis present

## 2024-05-25 DIAGNOSIS — R109 Unspecified abdominal pain: Secondary | ICD-10-CM | POA: Diagnosis present

## 2024-05-25 DIAGNOSIS — C7800 Secondary malignant neoplasm of unspecified lung: Secondary | ICD-10-CM | POA: Diagnosis present

## 2024-05-25 DIAGNOSIS — R627 Adult failure to thrive: Secondary | ICD-10-CM | POA: Diagnosis present

## 2024-05-25 DIAGNOSIS — Z7901 Long term (current) use of anticoagulants: Secondary | ICD-10-CM | POA: Diagnosis not present

## 2024-05-25 DIAGNOSIS — F411 Generalized anxiety disorder: Secondary | ICD-10-CM | POA: Diagnosis present

## 2024-05-25 DIAGNOSIS — D630 Anemia in neoplastic disease: Secondary | ICD-10-CM | POA: Diagnosis present

## 2024-05-25 DIAGNOSIS — K219 Gastro-esophageal reflux disease without esophagitis: Secondary | ICD-10-CM | POA: Diagnosis present

## 2024-05-25 DIAGNOSIS — I81 Portal vein thrombosis: Secondary | ICD-10-CM | POA: Diagnosis present

## 2024-05-25 DIAGNOSIS — Z981 Arthrodesis status: Secondary | ICD-10-CM

## 2024-05-25 DIAGNOSIS — C787 Secondary malignant neoplasm of liver and intrahepatic bile duct: Secondary | ICD-10-CM | POA: Diagnosis present

## 2024-05-25 DIAGNOSIS — D696 Thrombocytopenia, unspecified: Secondary | ICD-10-CM | POA: Diagnosis present

## 2024-05-25 DIAGNOSIS — Z882 Allergy status to sulfonamides status: Secondary | ICD-10-CM | POA: Diagnosis not present

## 2024-05-25 DIAGNOSIS — E278 Other specified disorders of adrenal gland: Secondary | ICD-10-CM | POA: Diagnosis present

## 2024-05-25 DIAGNOSIS — G9341 Metabolic encephalopathy: Secondary | ICD-10-CM | POA: Diagnosis present

## 2024-05-25 DIAGNOSIS — Z79899 Other long term (current) drug therapy: Secondary | ICD-10-CM

## 2024-05-25 DIAGNOSIS — Z7189 Other specified counseling: Secondary | ICD-10-CM | POA: Diagnosis not present

## 2024-05-25 DIAGNOSIS — E872 Acidosis, unspecified: Secondary | ICD-10-CM | POA: Diagnosis present

## 2024-05-25 DIAGNOSIS — C799 Secondary malignant neoplasm of unspecified site: Secondary | ICD-10-CM | POA: Diagnosis not present

## 2024-05-25 DIAGNOSIS — Z6829 Body mass index (BMI) 29.0-29.9, adult: Secondary | ICD-10-CM

## 2024-05-25 DIAGNOSIS — R1011 Right upper quadrant pain: Secondary | ICD-10-CM | POA: Diagnosis not present

## 2024-05-25 DIAGNOSIS — D72829 Elevated white blood cell count, unspecified: Secondary | ICD-10-CM | POA: Diagnosis present

## 2024-05-25 DIAGNOSIS — Z66 Do not resuscitate: Secondary | ICD-10-CM | POA: Diagnosis present

## 2024-05-25 DIAGNOSIS — I10 Essential (primary) hypertension: Secondary | ICD-10-CM | POA: Diagnosis present

## 2024-05-25 DIAGNOSIS — R7303 Prediabetes: Secondary | ICD-10-CM | POA: Diagnosis present

## 2024-05-25 DIAGNOSIS — N179 Acute kidney failure, unspecified: Secondary | ICD-10-CM | POA: Diagnosis present

## 2024-05-25 DIAGNOSIS — J4489 Other specified chronic obstructive pulmonary disease: Secondary | ICD-10-CM | POA: Diagnosis present

## 2024-05-25 DIAGNOSIS — Z7951 Long term (current) use of inhaled steroids: Secondary | ICD-10-CM

## 2024-05-25 DIAGNOSIS — Z515 Encounter for palliative care: Secondary | ICD-10-CM | POA: Diagnosis not present

## 2024-05-25 DIAGNOSIS — C23 Malignant neoplasm of gallbladder: Secondary | ICD-10-CM | POA: Diagnosis present

## 2024-05-25 DIAGNOSIS — Z79891 Long term (current) use of opiate analgesic: Secondary | ICD-10-CM

## 2024-05-25 DIAGNOSIS — Z888 Allergy status to other drugs, medicaments and biological substances status: Secondary | ICD-10-CM | POA: Diagnosis not present

## 2024-05-25 DIAGNOSIS — Z87891 Personal history of nicotine dependence: Secondary | ICD-10-CM | POA: Diagnosis not present

## 2024-05-25 DIAGNOSIS — G893 Neoplasm related pain (acute) (chronic): Principal | ICD-10-CM | POA: Diagnosis present

## 2024-05-25 DIAGNOSIS — Z806 Family history of leukemia: Secondary | ICD-10-CM

## 2024-05-25 LAB — CBC WITH DIFFERENTIAL/PLATELET
Abs Immature Granulocytes: 0.16 K/uL — ABNORMAL HIGH (ref 0.00–0.07)
Basophils Absolute: 0 K/uL (ref 0.0–0.1)
Basophils Relative: 0 %
Eosinophils Absolute: 0 K/uL (ref 0.0–0.5)
Eosinophils Relative: 0 %
HCT: 28.4 % — ABNORMAL LOW (ref 36.0–46.0)
Hemoglobin: 9.4 g/dL — ABNORMAL LOW (ref 12.0–15.0)
Immature Granulocytes: 1 %
Lymphocytes Relative: 7 %
Lymphs Abs: 0.9 K/uL (ref 0.7–4.0)
MCH: 33.3 pg (ref 26.0–34.0)
MCHC: 33.1 g/dL (ref 30.0–36.0)
MCV: 100.7 fL — ABNORMAL HIGH (ref 80.0–100.0)
Monocytes Absolute: 1.2 K/uL — ABNORMAL HIGH (ref 0.1–1.0)
Monocytes Relative: 9 %
Neutro Abs: 10.8 K/uL — ABNORMAL HIGH (ref 1.7–7.7)
Neutrophils Relative %: 83 %
Platelets: 127 K/uL — ABNORMAL LOW (ref 150–400)
RBC: 2.82 MIL/uL — ABNORMAL LOW (ref 3.87–5.11)
RDW: 19.6 % — ABNORMAL HIGH (ref 11.5–15.5)
WBC: 13.1 K/uL — ABNORMAL HIGH (ref 4.0–10.5)
nRBC: 0 % (ref 0.0–0.2)

## 2024-05-25 LAB — AMMONIA: Ammonia: 17 umol/L (ref 9–35)

## 2024-05-25 LAB — COMPREHENSIVE METABOLIC PANEL WITH GFR
ALT: 38 U/L (ref 0–44)
AST: 52 U/L — ABNORMAL HIGH (ref 15–41)
Albumin: 2.7 g/dL — ABNORMAL LOW (ref 3.5–5.0)
Alkaline Phosphatase: 131 U/L — ABNORMAL HIGH (ref 38–126)
Anion gap: 12 (ref 5–15)
BUN: 29 mg/dL — ABNORMAL HIGH (ref 8–23)
CO2: 20 mmol/L — ABNORMAL LOW (ref 22–32)
Calcium: 9.1 mg/dL (ref 8.9–10.3)
Chloride: 105 mmol/L (ref 98–111)
Creatinine, Ser: 1.06 mg/dL — ABNORMAL HIGH (ref 0.44–1.00)
GFR, Estimated: 54 mL/min — ABNORMAL LOW (ref >60)
Glucose, Bld: 109 mg/dL — ABNORMAL HIGH (ref 70–99)
Potassium: 3.7 mmol/L (ref 3.5–5.1)
Sodium: 137 mmol/L (ref 135–145)
Total Bilirubin: 5.3 mg/dL — ABNORMAL HIGH (ref 0.0–1.2)
Total Protein: 5.4 g/dL — ABNORMAL LOW (ref 6.5–8.1)

## 2024-05-25 LAB — PROTIME-INR
INR: 2.2 — ABNORMAL HIGH (ref 0.8–1.2)
Prothrombin Time: 25.6 s — ABNORMAL HIGH (ref 11.4–15.2)

## 2024-05-25 LAB — LIPASE, BLOOD: Lipase: 17 U/L (ref 11–51)

## 2024-05-25 MED ORDER — ACETAMINOPHEN 650 MG RE SUPP
650.0000 mg | Freq: Four times a day (QID) | RECTAL | Status: DC | PRN
Start: 2024-05-25 — End: 2024-05-28

## 2024-05-25 MED ORDER — LORATADINE 10 MG PO TABS: 10.0000 mg | ORAL_TABLET | ORAL | Status: DC

## 2024-05-25 MED ORDER — POLYETHYLENE GLYCOL 3350 17 G PO PACK: 17.0000 g | PACK | Freq: Two times a day (BID) | ORAL | Status: DC | PRN

## 2024-05-25 MED ORDER — OXYCODONE HCL 5 MG PO TABS
10.0000 mg | ORAL_TABLET | ORAL | Status: DC | PRN
  Administered 2024-05-26: 10 mg via ORAL
  Administered 2024-05-27: 15 mg via ORAL
  Filled 2024-05-25 (×2): qty 2
  Filled 2024-05-25: qty 3

## 2024-05-25 MED ORDER — PANTOPRAZOLE SODIUM 40 MG PO TBEC
40.0000 mg | DELAYED_RELEASE_TABLET | Freq: Every day | ORAL | Status: DC
  Administered 2024-05-25 – 2024-05-28 (×4): 40 mg via ORAL
  Filled 2024-05-25 (×4): qty 1

## 2024-05-25 MED ORDER — HYDROMORPHONE HCL 1 MG/ML IJ SOLN
0.5000 mg | INTRAMUSCULAR | Status: DC | PRN
  Administered 2024-05-25 (×3): 0.5 mg via INTRAVENOUS
  Filled 2024-05-25: qty 0.5
  Filled 2024-05-25 (×2): qty 1

## 2024-05-25 MED ORDER — MEGESTROL ACETATE 400 MG/10ML PO SUSP
400.0000 mg | Freq: Every day | ORAL | Status: DC
  Administered 2024-05-26 – 2024-05-28 (×3): 400 mg via ORAL
  Filled 2024-05-25 (×3): qty 10

## 2024-05-25 MED ORDER — NYSTATIN 100000 UNIT/ML MT SUSP: 5.0000 mL | OROMUCOSAL | Status: DC | PRN

## 2024-05-25 MED ORDER — SENNOSIDES-DOCUSATE SODIUM 8.6-50 MG PO TABS
1.0000 | ORAL_TABLET | Freq: Every day | ORAL | Status: DC
  Administered 2024-05-25 – 2024-05-26 (×2): 1 via ORAL
  Filled 2024-05-25 (×3): qty 1

## 2024-05-25 MED ORDER — ROSUVASTATIN CALCIUM 20 MG PO TABS
10.0000 mg | ORAL_TABLET | Freq: Every day | ORAL | Status: DC
  Administered 2024-05-25 – 2024-05-27 (×3): 10 mg via ORAL
  Filled 2024-05-25 (×3): qty 1

## 2024-05-25 MED ORDER — METOPROLOL SUCCINATE ER 50 MG PO TB24
50.0000 mg | ORAL_TABLET | Freq: Every day | ORAL | Status: DC
  Administered 2024-05-25 – 2024-05-28 (×4): 50 mg via ORAL
  Filled 2024-05-25 (×4): qty 1

## 2024-05-25 MED ORDER — ONDANSETRON HCL 4 MG/2ML IJ SOLN
4.0000 mg | Freq: Once | INTRAMUSCULAR | Status: AC
  Administered 2024-05-25: 4 mg via INTRAVENOUS
  Filled 2024-05-25: qty 2

## 2024-05-25 MED ORDER — BUDESON-GLYCOPYRROL-FORMOTEROL 160-9-4.8 MCG/ACT IN AERO
2.0000 | INHALATION_SPRAY | Freq: Two times a day (BID) | RESPIRATORY_TRACT | Status: DC
  Administered 2024-05-26 – 2024-05-27 (×3): 2 via RESPIRATORY_TRACT
  Filled 2024-05-25: qty 5.9

## 2024-05-25 MED ORDER — IOHEXOL 300 MG/ML  SOLN
100.0000 mL | Freq: Once | INTRAMUSCULAR | Status: AC | PRN
Start: 2024-05-25 — End: 2024-05-25
  Administered 2024-05-25: 100 mL via INTRAVENOUS

## 2024-05-25 MED ORDER — PIPERACILLIN-TAZOBACTAM 3.375 G IVPB
3.3750 g | Freq: Three times a day (TID) | INTRAVENOUS | Status: DC
  Administered 2024-05-26 – 2024-05-27 (×5): 3.375 g via INTRAVENOUS
  Filled 2024-05-25 (×5): qty 50

## 2024-05-25 MED ORDER — LACTATED RINGERS IV SOLN: INTRAVENOUS | Status: AC

## 2024-05-25 MED ORDER — SODIUM CHLORIDE (PF) 0.9 % IJ SOLN
INTRAMUSCULAR | Status: AC
Start: 2024-05-25 — End: 2024-05-25
  Filled 2024-05-25: qty 50

## 2024-05-25 MED ORDER — ACETAMINOPHEN 325 MG PO TABS: 650.0000 mg | ORAL_TABLET | Freq: Four times a day (QID) | ORAL | Status: DC | PRN

## 2024-05-25 MED ORDER — HYDROMORPHONE HCL 1 MG/ML IJ SOLN
0.5000 mg | Freq: Four times a day (QID) | INTRAMUSCULAR | Status: DC | PRN
  Administered 2024-05-26 – 2024-05-28 (×5): 0.5 mg via INTRAVENOUS
  Filled 2024-05-25 (×5): qty 0.5

## 2024-05-25 MED ORDER — ONDANSETRON HCL 4 MG/2ML IJ SOLN: 4.0000 mg | Freq: Four times a day (QID) | INTRAMUSCULAR | Status: DC | PRN

## 2024-05-25 MED ORDER — APIXABAN 2.5 MG PO TABS
2.5000 mg | ORAL_TABLET | Freq: Two times a day (BID) | ORAL | Status: DC
  Administered 2024-05-25 – 2024-05-27 (×4): 2.5 mg via ORAL
  Filled 2024-05-25 (×4): qty 1

## 2024-05-25 MED ORDER — ONDANSETRON HCL 4 MG PO TABS: 4.0000 mg | ORAL_TABLET | Freq: Four times a day (QID) | ORAL | Status: DC | PRN

## 2024-05-25 MED ORDER — PIPERACILLIN-TAZOBACTAM 3.375 G IVPB 30 MIN
3.3750 g | Freq: Once | INTRAVENOUS | Status: AC
  Administered 2024-05-25: 3.375 g via INTRAVENOUS
  Filled 2024-05-25: qty 50

## 2024-05-25 MED ORDER — MORPHINE SULFATE ER 15 MG PO TBCR
15.0000 mg | EXTENDED_RELEASE_TABLET | Freq: Two times a day (BID) | ORAL | Status: DC
Start: 2024-05-25 — End: 2024-05-28
  Administered 2024-05-25 – 2024-05-28 (×6): 15 mg via ORAL
  Filled 2024-05-25 (×6): qty 1

## 2024-05-25 MED ORDER — ALBUTEROL SULFATE (2.5 MG/3ML) 0.083% IN NEBU: 2.5000 mg | INHALATION_SOLUTION | Freq: Four times a day (QID) | RESPIRATORY_TRACT | Status: DC | PRN

## 2024-05-25 NOTE — ED Notes (Signed)
 PT voided via bedpan, but it spilled in the bed; will attempt to get urine sample next time PT has to urine; RN notified.

## 2024-05-25 NOTE — Progress Notes (Signed)
 [facility]  [facility] Liaison Note  Received a request for information regarding outpatient palliative and hospice services from patient's daughter, Grayce.  Liaison received approval from Delon Lesches, ICM to reach out to family.  Liaison has been unable to speak with daughter directly and will try again on Thursday morning.  Thank you Inocente Jacobs RN BSN [facility] Liaison [phone]

## 2024-05-25 NOTE — ED Provider Notes (Signed)
 [place] [facility]MENT AT [facility] Provider Note   CSN: [id number] Arrival date & time: 05/25/24  1044     Patient presents with: Abdominal Pain   Janice Pace is a 78 y.o. female.    Abdominal Pain    Patient presented to the ED with complaints of upper abdominal pain.  The patient has a history of metastatic gallbladder cancer, acid reflux, COPD, hiatal hernia.  Patient was in admitted to the hospital recently on October 29.  Patient was discharged on the eighth.  At that time patient was treated for abdominal pain associated with metastatic cancer.  Patient had an external biliary drain placed.  Patient states her abdominal pain worsened today despite taking her MS Contin  and oxycodone  at home.  She denies having any fevers or chills.  No vomiting.  She does have occasional loose stools.Does feel like her abdomen is a little bit more swollen than previous  Prior to Admission medications   Medication Sig Start Date End Date Taking? Authorizing Provider  albuterol  (VENTOLIN  HFA) 108 (90 Base) MCG/ACT inhaler Inhale 1-2 puffs into the lungs every 6 (six) hours as needed for wheezing or shortness of breath. 04/11/24   Kara Dorn NOVAK, MD  apixaban (ELIQUIS) 2.5 MG TABS tablet Take 1 tablet (2.5 mg total) by mouth 2 (two) times daily. 05/21/24 08/19/24  [place], Eric J, DO  budesonide -glycopyrrolate -formoterol  (BREZTRI  AEROSPHERE) 160-9-4.8 MCG/ACT AERO inhaler Inhale 2 puffs into the lungs in the morning and at bedtime. 04/11/24   Kara Dorn NOVAK, MD  docusate sodium  (COLACE) 100 MG capsule Take 1 capsule (100 mg total) by mouth 2 (two) times daily. 05/21/24 08/19/24  [place], Eric J, DO  fluconazole  (DIFLUCAN ) 200 MG tablet Take 1 tablet (200 mg total) by mouth daily. 04/26/24   Pasam, Chinita, MD  lactulose (CHRONULAC) 10 GM/15ML solution Take 30 mLs (20 g total) by mouth 2 (two) times daily. 05/21/24   [place], Camellia PARAS, DO  lidocaine -prilocaine  (EMLA ) cream Apply  to affected area once Patient taking differently: Apply 1 Application topically as needed (for port access). 12/24/23   Pasam, Chinita, MD  loratadine  (CLARITIN ) 10 MG tablet Take 10 mg by mouth See admin instructions. Take 10 mg by mouth once a day on non-chemo weeks    [provider]  losartan (COZAAR) 50 MG tablet Take 1 tablet (50 mg total) by mouth daily. 05/09/24   Gayle Saddie FALCON, PA-C  magic mouthwash (nystatin, lidocaine , diphenhydrAMINE , alum & mag hydroxide) suspension Take 5 mLs by mouth every 4 (four) hours as needed for mouth pain. Suspension contains equal amounts of Maalox, nystatin, diphenhydramine  and lidocaine . 1:1:1:1 ratio. 04/28/24   Pasam, Chinita, MD  magnesium  oxide (MAG-OX) 400 (240 Mg) MG tablet Take 1 tablet (400 mg total) by mouth 2 (two) times daily. 05/04/24   Pasam, Chinita, MD  megestrol  (MEGACE  ES) 625 MG/5ML suspension Take 5 mLs (625 mg total) by mouth daily. 01/27/24   Pasam, Chinita, MD  metoprolol  succinate (TOPROL -XL) 50 MG 24 hr tablet TAKE 1 TABLET BY MOUTH ONCE DAILY. TAKE WITH OR IMMEDIATELY FOLLOWING A MEAL Patient taking differently: Take 50 mg by mouth daily after breakfast. 11/11/23   Chandra Toribio POUR, MD  morphine  (MS CONTIN ) 15 MG 12 hr tablet Take 1 tablet (15 mg total) by mouth every 12 (twelve) hours. 05/06/24   Pasam, Chinita, MD  omeprazole  (PRILOSEC) 20 MG capsule Take 1 capsule (20 mg total) by mouth daily. Patient taking differently: Take 20 mg by  mouth See admin instructions. Take 20 mg by mouth in the morning and evening- 30 minutes before a meal 05/02/24   Clapp, Kara F, PA-C  ondansetron  (ZOFRAN ) 8 MG tablet Take 1 tablet (8 mg total) by mouth every 8 (eight) hours as needed for nausea or vomiting. Start on the third day after cisplatin . 12/24/23   Pasam, Chinita, MD  oxyCODONE  10 MG TABS Take 1-1.5 tablets (10-15 mg total) by mouth every 4 (four) hours as needed for moderate pain (pain score 4-6) or severe pain (pain score 7-10). 12/16/23    Laurence Locus, DO  polyethylene glycol (MIRALAX  / GLYCOLAX ) 17 g packet Take 17 g by mouth 2 (two) times daily as needed for mild constipation or moderate constipation.    [provider]  prochlorperazine  (COMPAZINE ) 10 MG tablet Take 1 tablet (10 mg total) by mouth every 6 (six) hours as needed (Nausea or vomiting). 12/24/23   Pasam, Chinita, MD  rosuvastatin  (CRESTOR ) 10 MG tablet Take 1 tablet by mouth once daily 05/16/24   Clapp, Kara F, PA-C  sodium chloride  flush 0.9 % SOLN injection Flush abdominal drain with 5 ml(cc) sterile saline once daily 05/20/24   Allred, Darrell K, PA-C  spironolactone  (ALDACTONE ) 25 MG tablet TAKE 1 TABLET BY MOUTH ONCE DAILY IN THE MORNING 05/06/24   Ladona Heinz, MD    Allergies: Sulfa antibiotics, Forteo  [parathyroid hormone (recomb)], Atorvastatin, and Metformin hcl    Review of Systems  Gastrointestinal:  Positive for abdominal pain.    Updated Vital Signs BP (!) 110/48   Pulse 87   Temp 97.8 F (36.6 C) (Oral)   Resp 18   Ht 1.575 m (5' 2)   SpO2 99%   BMI 29.08 kg/m   Physical Exam Vitals and nursing note reviewed.  Constitutional:      Appearance: She is ill-appearing.  HENT:     Head: Normocephalic and atraumatic.     Right Ear: External ear normal.     Left Ear: External ear normal.  Eyes:     General: Scleral icterus present.        Right eye: No discharge.        Left eye: No discharge.     Conjunctiva/sclera: Conjunctivae normal.  Neck:     Trachea: No tracheal deviation.  Cardiovascular:     Rate and Rhythm: Normal rate and regular rhythm.  Pulmonary:     Effort: Pulmonary effort is normal. No respiratory distress.     Breath sounds: Normal breath sounds. No stridor. No wheezing or rales.  Abdominal:     General: Bowel sounds are normal. There is distension.     Palpations: Abdomen is soft.     Tenderness: There is abdominal tenderness. There is no guarding or rebound.  Musculoskeletal:        General: No tenderness  or deformity.     Cervical back: Neck supple.  Skin:    General: Skin is warm and dry.     Findings: No rash.  Neurological:     General: No focal deficit present.     Mental Status: She is alert.     Cranial Nerves: No cranial nerve deficit, dysarthria or facial asymmetry.     Sensory: No sensory deficit.     Motor: No abnormal muscle tone or seizure activity.     Coordination: Coordination normal.  Psychiatric:        Mood and Affect: Mood normal.     (all labs ordered are listed, but  only abnormal results are displayed) Labs Reviewed  COMPREHENSIVE METABOLIC PANEL WITH GFR - Abnormal; Notable for the following components:      Result Value   CO2 20 (*)    Glucose, Bld 109 (*)    BUN 29 (*)    Creatinine, Ser 1.06 (*)    Total Protein 5.4 (*)    Albumin  2.7 (*)    AST 52 (*)    Alkaline Phosphatase 131 (*)    Total Bilirubin 5.3 (*)    GFR, Estimated 54 (*)    All other components within normal limits  CBC WITH DIFFERENTIAL/PLATELET - Abnormal; Notable for the following components:   WBC 13.1 (*)    RBC 2.82 (*)    Hemoglobin 9.4 (*)    HCT 28.4 (*)    MCV 100.7 (*)    RDW 19.6 (*)    Platelets 127 (*)    Neutro Abs 10.8 (*)    Monocytes Absolute 1.2 (*)    Abs Immature Granulocytes 0.16 (*)    All other components within normal limits  PROTIME-INR - Abnormal; Notable for the following components:   Prothrombin Time 25.6 (*)    INR 2.2 (*)    All other components within normal limits  LIPASE, BLOOD  AMMONIA  URINALYSIS, ROUTINE W REFLEX MICROSCOPIC  I-STAT CHEM 8, ED    EKG: None  Radiology: CT ABDOMEN PELVIS W CONTRAST Result Date: 05/25/2024 CLINICAL DATA:  Acute abdominal pain. History of gallbladder cancer. EXAM: CT ABDOMEN AND PELVIS WITH CONTRAST TECHNIQUE: Multidetector CT imaging of the abdomen and pelvis was performed using the standard protocol following bolus administration of intravenous contrast. RADIATION DOSE REDUCTION: This exam was  performed according to the departmental dose-optimization program which includes automated exposure control, adjustment of the mA and/or kV according to patient size and/or use of iterative reconstruction technique. CONTRAST:  OMNIPAQUE  IOHEXOL  300 MG/ML  SOLN COMPARISON:  CT abdomen and pelvis 05/11/2024. MRI of the abdomen 05/13/2024. FINDINGS: Lower chest: There scattered pulmonary nodules in the bilateral lower lobes with areas of atelectasis. Nodules measure up 8 mm in the appear grossly unchanged. Hepatobiliary: The gallbladder is contracted or absent. Percutaneous biliary stent is in place ending in the duodenal which is new from prior. There are numerous metastatic lesions throughout the liver. These have increased in size and number, particularly in the left lobe of the liver and caudate. Metastatic disease is now seen extending along the left portal triad. Left portal vein is not visualized, progression from prior. There also new subcapsular fluid collections with enhancing borders along the inferior margin of the liver measuring up to 3.9 cm. There surrounding stranding. Pancreas: Unremarkable. No pancreatic ductal dilatation or surrounding inflammatory changes. Spleen: Normal in size without focal abnormality. Adrenals/Urinary Tract: Bilateral adrenal nodules are stable from prior. Bilateral kidneys and bladder are within normal limits. Stomach/Bowel: There is wall thickening and inflammation of the ascending colon as it abuts the inferior margin of the liver and subcapsular fluid collections. The appendix is not seen. There is no bowel obstruction, pneumatosis or free air. Vascular/Lymphatic: Aorta and IVC are normal in size. There are atherosclerotic calcifications of the aorta. No discrete enlarged lymph nodes are identified. Reproductive: The uterus is surgically absent. Nodular implant overlying the left vaginal cuff measures up to 4 cm, unchanged from prior. Other: There is a new small amount  of ascites in all 4 abdominal quadrants. There is mild body wall edema. Peritoneal implants in the anterior abdomen have not  significantly changed measuring up to 2.3 cm. Musculoskeletal: Lumbar fusion hardware again noted. No acute osseous abnormality. IMPRESSION: 1. New percutaneous biliary stent in place. 2. Increasing size and number of hepatic metastatic lesions. 3. New subcapsular fluid collections along the inferior margin of the liver with surrounding inflammation. Findings may be related to infection or metastatic disease. 4. Wall thickening and inflammation of the ascending colon as it abuts the inferior margin of the liver and subcapsular fluid collections. Findings may be reactive to adjacent liver disease. 5. New small amount of ascites. 6. Stable peritoneal implants. 7. Stable bilateral adrenal nodules. 8. Stable pulmonary nodules in the lung bases. Aortic Atherosclerosis (ICD10-I70.0). Electronically Signed   By: Greig Pique M.D.   On: 05/25/2024 15:44     .Critical Care  Performed by: Randol Simmonds, MD Authorized by: Randol Simmonds, MD   Critical care provider statement:    Critical care time (minutes):  45   Critical care was time spent personally by me on the following activities:  Development of treatment plan with patient or surrogate, discussions with consultants, evaluation of patient's response to treatment, examination of patient, ordering and review of laboratory studies, ordering and review of radiographic studies, ordering and performing treatments and interventions, pulse oximetry, re-evaluation of patient's condition and review of old charts    Medications Ordered in the ED  HYDROmorphone  (DILAUDID ) injection 0.5 mg (0.5 mg Intravenous Given 05/25/24 1613)  piperacillin-tazobactam (ZOSYN) IVPB 3.375 g (3.375 g Intravenous New Bag/Given 05/25/24 1614)  ondansetron  (ZOFRAN ) injection 4 mg (4 mg Intravenous Given 05/25/24 1215)  iohexol  (OMNIPAQUE ) 300 MG/ML solution 100 mL (100  mLs Intravenous Contrast Given 05/25/24 1431)    Clinical Course as of 05/25/24 1620  Wed May 25, 2024  1249 CBC with Diff(!) Leukocytosis noted hemoglobin stable [JK]  1407 Comprehensive metabolic panel(!) Bilirubin decreased compared to previous [JK]  1408 Ammonia Normal [JK]  1408 Lipase, blood Normal [JK]  1614 CT scan shows increasing size and number of hepatic metastatic lesions.  Patient also has new subcapsular fluid collections with surrounding inflammation.  Possible infection or metastatic disease [JK]  1616 Case discussed with Dr Merilee.  Oncology will see pt in consultation [JK]    Clinical Course User Index [JK] Randol Simmonds, MD                                 Medical Decision Making Problems Addressed: Carcinoma of gall bladder [facility]): chronic illness or injury with exacerbation, progression, or side effects of treatment Metastatic malignant neoplasm, unspecified site [facility]): chronic illness or injury with exacerbation, progression, or side effects of treatment  Amount and/or Complexity of Data Reviewed Labs: ordered. Decision-making details documented in ED Course. Radiology: ordered and independent interpretation performed.  Risk Prescription drug management.   Patient presented to the ED for worsening abdominal pain.  Patient with unfortunate diagnosis of carcinoma of the gallbladder.  Patient has had progression of her disease and recently was in the hospital requiring a biliary stent.  Patient had increasing pain and weakness that brought her to the ED today.  On exam patient overall appears ill.  She is jaundiced.  She has tenderness in her abdomen.  Laboratory test did show slight leukocytosis.  CT scan imaging shows worsening hepatic metastases as well as inflammation concerning for possible metastatic disease versus infection.  Patient has been treated with IV fluids IV pain medications.  Also started on antibiotics.  Discussed the case with Dr. Merilee  oncology.  They will see patient in consultation.  I will consult the medical service for admission  Case discussed with Dr Mennie LAMY.  Palliative care consult also placed     Final diagnoses:  Metastatic malignant neoplasm, unspecified site [facility])  Carcinoma of gall bladder [facility])    ED Discharge Orders     None          Randol Simmonds, MD 05/25/24 1626

## 2024-05-25 NOTE — Telephone Encounter (Signed)
 FYI Only or Action Required?: FYI only for provider: ED advised.  Patient was last seen in primary care on 05/09/2024 by Gayle Saddie FALCON, PA-C.  Called Nurse Triage reporting Extremity Weakness.  Symptoms began yesterday.  Interventions attempted: Nothing.  Symptoms are: rapidly worsening.  Triage Disposition: Call EMS 911 Now  Patient/caregiver understands and will follow disposition?: Yes    Copied from CRM [id number]. Topic: Clinical - Red Word Triage >> May 25, 2024  8:52 AM Selinda RAMAN wrote: Red Word that prompted transfer to Nurse Triage: Grayce the daughter of the patient called in stating her mother was seen at [facility] as she has gallbladder cancer and she was having issues with her bile drain tube. She had a procedure to help with that. She was discharged on Saturday November 8th but has been extremely weak and has had a loss of appetite. Grayce is concerned she is dehydrated and just very worried in general. She would like to speak with a nurse and I will transfer her to E2C2 NT Reason for Disposition  SEVERE difficulty breathing (e.g., struggling for each breath, speaks in single words)  Answer Assessment - Initial Assessment Questions 1. DESCRIPTION: Describe how you are feeling.     Weakness, unable to keep her eyes open 2. SEVERITY: How bad is it?  Can you stand and walk?     moderate 3. ONSET: When did these symptoms begin? (e.g., hours, days, weeks, months)     Worsening yesterday 4. CAUSE: What do you think is causing the weakness or fatigue? (e.g., not drinking enough fluids, medical problem, trouble sleeping)     not drinking enough fluids, not eating enough 5. NEW MEDICINES:  Have you started on any new medicines recently? (e.g., opioid pain medicines, benzodiazepines, muscle relaxants, antidepressants, antihistamines, neuroleptics, beta blockers)     na 6. OTHER SYMPTOMS: Do you have any other symptoms? (e.g., chest pain, fever, cough, SOB, vomiting,  diarrhea, bleeding, other areas of pain)     Diarrhea at times 7. PREGNANCY: Is there any chance you are pregnant? When was your last menstrual period?     Na  Pale in color, extreme weakness, daughter stated color of skin yellow: at times pt states feel like she is going to pass out.  nurse recommended pt go to ED now: daughter verbalized understanding and stated they will get her in the car and take her now.  Protocols used: Weakness (Generalized) and Fatigue-A-AH

## 2024-05-25 NOTE — Hospital Course (Addendum)
 78 y.o. 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   CT showed progression of disease, started on antibiotics in hope that some of symptoms are infectious.  Oncology and Palliative consulted.        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

## 2024-05-25 NOTE — H&P (Signed)
 History and Physical    Janice Pace FMW:[id number] DOB: May 04, 1946 DOA: 05/25/2024  PCP: Gayle Saddie FALCON, PA-C   Patient coming from: Home Chief Complaint  Patient presents with   Abdominal Pain   HPI: Janice Pace is a 78 y.o. 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 Per husband at bedside she has not had much appetite and not been eating much, and weak. Patient nausea, vomiting, chest pain, shortness of breath, fever, chills, headache, focal weakness, numbness tingling, speech difficulties   In ED: afebrile, VSS- SBP IN 110-120S,on RA,Labs  cmp- low albuymin 2.7, TB 5.3 improving from 6.9 on 11/8 AST ALT stable, creat 1.09 from .79, NH3 17, leucocytosis at 13, plt 127, hb 9.7 found to have jaundice with abdominal distention/tenderness CT abdomen pelvis>> biliary stent in place, increasing size and number of hepatic metastatic lesions new subscapular fluid collection under the liver finding could be infection or metastatic disease, ascending colon wall thickening may be reactive, numerous small amount of ascites stable peritoneal implants, bilateral adrenal nodules and pulmonary nodules. Patient given fentanyl  by EMS and in the ED IV Dilaudid .  IV Zosyn started and admission requested for aki, abdomen pain with increased tumor burden and ?infection. Oncology consulted. Palliative care consulted for  GOC.  Assessment and plan:  Pain in the abdomen Stage IV gallbladder carcinoma with peritoneal/lung metastasis Recent biliary stent placed by IR for obstructive jaundice Obstructive jaundice due to gallbladder malignancy/increased tumor burden: Patient readmitted with worsening abdominal pain from recent admission.  Palliative care oncology has been consulted.  Discussed with oncology team- Dr Autumn had mentioned about hospice on previous encounter.plan for ivf -hydration pain management-Home MS Contin  twice daily and as needed oxy Encourage further goals of care discussion.  Overall prognosis extremely poor.  Subcapsular fluid collection Mild leukocytosis: ? Infection versus related to tumor, on empiric antibiotics for now.  Acute kidney injury Mild metabolic acidosis due to renal failure: Creatinine recently around 0.6 increased to 1.0, given IV fluid hydration.  Recent portal vein thrombosis: Continue Eliquis started on last admission  Prediabetes: Blood sugar stable  Hypertension: Continue Toprol  but hold Aldactone  losartan   Anemia of malignancy Thrombocytopenia: Hemoglobin improved to 9.4 recently as low as 7.2, platelet holding in 110.  Monitor CBC  GERD: PPI  COPD with asthma: Stable on room air.  Continue Breztri , PRN nebs,  Generalized anxiety disorder: Anxiolytics prn  Failure to thrive Goals of care: DNR/DNI, prognosis is extremely poor palliative care oncology has been consulted. Cont laxatives, pain meds and megace .  Mobility: PT Orders:  PT Follow up Rec:    DVT prophylaxis: apixaban (ELIQUIS) tablet 2.5 mg Start: 05/25/24 2200 Code Status:   Code Status: Limited: Do not attempt resuscitation (DNR) -DNR-LIMITED -Do Not Intubate/DNI  Family Communication: plan of care discussed with patient/husband at bedside. Patient status is: Remains hospitalized because of severity of illness Level of care: Telemetry   Objective: Vitals last 24 hrs: Vitals:    05/25/24 1103 05/25/24 1300 05/25/24 1458 05/25/24 1459  BP:  (!) 110/55 (!) 110/48   Pulse:  81 87   Resp:   18   Temp:    97.8 F (36.6 C)  TempSrc:    Oral  SpO2:  100% 99%   Height: 5' 2 (1.575 m)       Physical Examination: General exam: alert awake, oriented, older than stated age HEENT:Oral mucosa moist, Ear/Nose WNL grossly Respiratory system: Bilaterally clear BS,no use of accessory muscle Cardiovascular system: S1 & S2 +, No JVD. Gastrointestinal system: Abdomen soft, tender diffusely more on RUQ, drain +, dressing c/d/i, BS+ Nervous System: Alert, awake, moving all extremities,and following commands. Extremities: extremities warm, leg edema ned Skin: Warm, no rashes MSK: Normal muscle bulk,tone, power   Medications reviewed:  Scheduled Meds:  apixaban  2.5 mg Oral BID   budesonide -glycopyrrolate -formoterol   2 puff Inhalation BID   loratadine   10 mg Oral See admin instructions   megestrol   625 mg Oral Daily   [START ON 05/26/2024] metoprolol  succinate  50 mg Oral QPC breakfast   morphine   15 mg Oral Q12H   pantoprazole   40 mg Oral Daily   rosuvastatin   10 mg Oral Daily   senna-docusate  1 tablet Oral QHS   Continuous Infusions:  lactated ringers         Severity of Illness: The appropriate patient status for this patient is INPATIENT. Inpatient status is judged to be reasonable and necessary in order to provide the required intensity of service to ensure the patient's safety. The patient's presenting symptoms, physical exam findings, and initial radiographic and laboratory data in the context of their chronic comorbidities is felt to place them at high risk for further clinical deterioration. Furthermore, it is not anticipated that the patient will be medically stable for discharge from the hospital within 2 midnights of admission.   * I certify that at the point of admission it is my clinical judgment that the patient will require inpatient hospital care  spanning beyond 2 midnights from the point of admission due to high intensity of service, high risk for further deterioration and high frequency of surveillance required.*  Family Communication: Admission, patients condition and plan of care including tests being ordered have been discussed with the patient and her husband who indicate understanding and agree with the plan and Code Status.  Consults called:  Oncology Palliative care  Review of Systems: All systems were reviewed and were negative except as mentioned in HPI above. Negative for fever Negative for chest pain Negative for shortness of breath  Past Medical History:  Diagnosis Date   Arthritis    Asthma    Bronchitis    Cancer (HCC)    dermatosivros arcoma and protuverans   Carpal tunnel syndrome, left upper limb 11/06/2017   Chronic back pain    degenerative disc disease   Constipation    stool softener daily   Constipation    COPD (chronic obstructive pulmonary disease) (HCC)    GERD (gastroesophageal reflux disease)    takes Prilosec daily   GERD (gastroesophageal reflux disease)    Heart murmur    Heart valve disorder    Hemorrhoids    History of hiatal hernia    Ileus (HCC) 03/02/2021   Insomnia    d/t chantix    Joint pain    Nocturia    Ovarian failure 05/28/2022   Personal history of colonic adenoma 03/13/2003   03/13/2003 - 5 mm adenoma   Pre-diabetes    Prediabetes    White coat  syndrome without diagnosis of hypertension     Past Surgical History:  Procedure Laterality Date   ABDOMINAL HYSTERECTOMY     ANTERIOR LAT LUMBAR FUSION N/A 02/27/2021   Procedure: Anterior lateral interbody fusion - lateral two - lateral three - lateral three - lateral four with exploration fusion L4-S1;  Surgeon: Onetha Kuba, MD;  Location: [facility] OR;  Service: Neurosurgery;  Laterality: N/A;   BACK SURGERY  1988   BACK SURGERY  09/03/2011   rods,screws x 8    BACK SURGERY     09/2022   BUNIONECTOMY     bilateral    BUNIONECTOMY     bil feet   COLONOSCOPY     dermatosibrosarcoma protuberans     IR BILIARY DRAIN PLACEMENT WITH CHOLANGIOGRAM  05/14/2024   IR BILIARY DRAIN PLACEMENT WITH CHOLANGIOGRAM  05/18/2024   IR IMAGING GUIDED PORT INSERTION  01/07/2024   IR PARACENTESIS  05/18/2024   LAMINECTOMY WITH POSTERIOR LATERAL ARTHRODESIS LEVEL 3 N/A 02/27/2021   Procedure: Posterior augmentation with pedicle screws Latreal one - lateral four - Posterior Lateral and Interbody fusion;  Surgeon: Onetha Kuba, MD;  Location: [facility] OR;  Service: Neurosurgery;  Laterality: N/A;   PARTIAL HYSTERECTOMY     RECTOCELE REPAIR     Rectum Repair     RESECTION TUMOR WRIST RADICAL     x 2 rt   TUBAL LIGATION  1974   TUBAL LIGATION     WRIST SURGERY     left     reports that she quit smoking about 12 years ago. Her smoking use included cigarettes. She started smoking about 62 years ago. She has a 50 pack-year smoking history. She has never been exposed to tobacco smoke. She has never used smokeless tobacco. She reports that she does not currently use alcohol. She reports that she does not use drugs.  Allergies  Allergen Reactions   Sulfa Antibiotics Hives and Itching   Forteo  [Parathyroid Hormone (Recomb)] Other (See Comments)    Too many side effects   Atorvastatin Other (See Comments)    Muscle aches   Metformin Hcl Other (See Comments)    Joint pains and muscle aches    Family History  Problem Relation Age of Onset   Leukemia Mother    Anesthesia problems Neg Hx    Hypotension Neg Hx    Malignant hyperthermia Neg Hx    Pseudochol deficiency Neg Hx    Colon cancer Neg Hx    Colon polyps Neg Hx    Esophageal cancer Neg Hx    Stomach cancer Neg Hx    Rectal cancer Neg Hx      Prior to Admission medications   Medication Sig Start Date End Date Taking? Authorizing Provider  albuterol  (VENTOLIN  HFA) 108 (90 Base) MCG/ACT inhaler Inhale 1-2 puffs into the lungs every 6 (six) hours as needed for wheezing or  shortness of breath. 04/11/24   Kara Dorn NOVAK, MD  apixaban (ELIQUIS) 2.5 MG TABS tablet Take 1 tablet (2.5 mg total) by mouth 2 (two) times daily. 05/21/24 08/19/24  [place], Eric J, DO  budesonide -glycopyrrolate -formoterol  (BREZTRI  AEROSPHERE) 160-9-4.8 MCG/ACT AERO inhaler Inhale 2 puffs into the lungs in the morning and at bedtime. 04/11/24   Kara Dorn NOVAK, MD  docusate sodium  (COLACE) 100 MG capsule Take 1 capsule (100 mg total) by mouth 2 (two) times daily. 05/21/24 08/19/24  [place], Eric J, DO  fluconazole  (DIFLUCAN ) 200 MG tablet Take 1 tablet (200 mg total) by mouth daily. 04/26/24  Pasam, Avinash, MD  lactulose (CHRONULAC) 10 GM/15ML solution Take 30 mLs (20 g total) by mouth 2 (two) times daily. 05/21/24   [place], Camellia PARAS, DO  lidocaine -prilocaine  (EMLA ) cream Apply to affected area once Patient taking differently: Apply 1 Application topically as needed (for port access). 12/24/23   Pasam, Chinita, MD  loratadine  (CLARITIN ) 10 MG tablet Take 10 mg by mouth See admin instructions. Take 10 mg by mouth once a day on non-chemo weeks    [provider]  losartan (COZAAR) 50 MG tablet Take 1 tablet (50 mg total) by mouth daily. 05/09/24   Gayle Saddie FALCON, PA-C  magic mouthwash (nystatin, lidocaine , diphenhydrAMINE , alum & mag hydroxide) suspension Take 5 mLs by mouth every 4 (four) hours as needed for mouth pain. Suspension contains equal amounts of Maalox, nystatin, diphenhydramine  and lidocaine . 1:1:1:1 ratio. 04/28/24   Pasam, Chinita, MD  magnesium  oxide (MAG-OX) 400 (240 Mg) MG tablet Take 1 tablet (400 mg total) by mouth 2 (two) times daily. 05/04/24   Pasam, Chinita, MD  megestrol  (MEGACE  ES) 625 MG/5ML suspension Take 5 mLs (625 mg total) by mouth daily. 01/27/24   Pasam, Chinita, MD  metoprolol  succinate (TOPROL -XL) 50 MG 24 hr tablet TAKE 1 TABLET BY MOUTH ONCE DAILY. TAKE WITH OR IMMEDIATELY FOLLOWING A MEAL Patient taking differently: Take 50 mg by mouth daily after breakfast.  11/11/23   Chandra Toribio POUR, MD  morphine  (MS CONTIN ) 15 MG 12 hr tablet Take 1 tablet (15 mg total) by mouth every 12 (twelve) hours. 05/06/24   Pasam, Chinita, MD  omeprazole  (PRILOSEC) 20 MG capsule Take 1 capsule (20 mg total) by mouth daily. Patient taking differently: Take 20 mg by mouth See admin instructions. Take 20 mg by mouth in the morning and evening- 30 minutes before a meal 05/02/24   Clapp, Kara F, PA-C  ondansetron  (ZOFRAN ) 8 MG tablet Take 1 tablet (8 mg total) by mouth every 8 (eight) hours as needed for nausea or vomiting. Start on the third day after cisplatin . 12/24/23   Pasam, Chinita, MD  oxyCODONE  10 MG TABS Take 1-1.5 tablets (10-15 mg total) by mouth every 4 (four) hours as needed for moderate pain (pain score 4-6) or severe pain (pain score 7-10). 12/16/23   Laurence Camellia, DO  polyethylene glycol (MIRALAX  / GLYCOLAX ) 17 g packet Take 17 g by mouth 2 (two) times daily as needed for mild constipation or moderate constipation.    [provider]  prochlorperazine  (COMPAZINE ) 10 MG tablet Take 1 tablet (10 mg total) by mouth every 6 (six) hours as needed (Nausea or vomiting). 12/24/23   Pasam, Chinita, MD  rosuvastatin  (CRESTOR ) 10 MG tablet Take 1 tablet by mouth once daily 05/16/24   Gayle Saddie F, PA-C  sodium chloride  flush 0.9 % SOLN injection Flush abdominal drain with 5 ml(cc) sterile saline once daily 05/20/24   Allred, Darrell K, PA-C  spironolactone  (ALDACTONE ) 25 MG tablet TAKE 1 TABLET BY MOUTH ONCE DAILY IN THE MORNING 05/06/24   Ladona Heinz, MD  Labs on Admission: I have personally reviewed following labs and imaging studies  CBC: Recent Labs  Lab 05/19/24 0520 05/20/24 0500 05/21/24 0540 05/25/24 1205  WBC 6.3 7.3 8.7 13.1*  NEUTROABS  --   --   --  10.8*  HGB 7.5* 7.2* 9.3* 9.4*  HCT 23.4* 22.6* 27.9* 28.4*  MCV 104.5* 105.1* 98.6 100.7*  PLT 113* 118* 116* 127*   Basic Metabolic Panel: Recent Labs  Lab 05/19/24 0520 05/20/24  0500 05/21/24 0540  05/25/24 1205  NA 137 137 136 137  K 3.7 3.4* 3.9 3.7  CL 107 106 107 105  CO2 21* 20* 21* 20*  GLUCOSE 110* 107* 110* 109*  BUN 12 11 12  29*  CREATININE 0.80 0.67 0.79 1.06*  CALCIUM  8.7* 8.5* 8.8* 9.1   Estimated Creatinine Clearance: 41.3 mL/min (A) (by C-G formula based on SCr of 1.06 mg/dL (H)). Recent Labs  Lab 05/19/24 0520 05/20/24 0500 05/21/24 0540 05/25/24 1205  AST 69* 44* 32 52*  ALT 70* 53* 42 38  ALKPHOS 223* 192* 168* 131*  BILITOT 7.9* 5.6* 6.9* 5.3*  PROT 5.3* 5.3* 5.5* 5.4*  ALBUMIN  2.8* 2.9* 2.9* 2.7*   Recent Labs  Lab 05/25/24 1205  LIPASE 17   Recent Labs  Lab 05/25/24 1227  AMMONIA 17   Coagulation Profile: Recent Labs  Lab 05/25/24 1205  INR 2.2*   Thyroid  Function Tests: No results for input(s): TSH, T4TOTAL, FREET4, T3FREE, THYROIDAB in the last 72 hours. Urine analysis:    Component Value Date/Time   COLORURINE YELLOW 05/11/2024 1833   APPEARANCEUR CLEAR 05/11/2024 1833   LABSPEC 1.028 05/11/2024 1833   PHURINE 6.0 05/11/2024 1833   GLUCOSEU NEGATIVE 05/11/2024 1833   HGBUR NEGATIVE 05/11/2024 1833   BILIRUBINUR NEGATIVE 05/11/2024 1833   BILIRUBINUR small (A) 09/23/2023 1109   BILIRUBINUR negative 08/26/2023 0944   KETONESUR NEGATIVE 05/11/2024 1833   PROTEINUR NEGATIVE 05/11/2024 1833   UROBILINOGEN 2.0 (A) 09/23/2023 1109   NITRITE NEGATIVE 05/11/2024 1833   LEUKOCYTESUR NEGATIVE 05/11/2024 1833    Radiological Exams on Admission: CT ABDOMEN PELVIS W CONTRAST Result Date: 05/25/2024 CLINICAL DATA:  Acute abdominal pain. History of gallbladder cancer. EXAM: CT ABDOMEN AND PELVIS WITH CONTRAST TECHNIQUE: Multidetector CT imaging of the abdomen and pelvis was performed using the standard protocol following bolus administration of intravenous contrast. RADIATION DOSE REDUCTION: This exam was performed according to the departmental dose-optimization program which includes automated exposure control, adjustment of the  mA and/or kV according to patient size and/or use of iterative reconstruction technique. CONTRAST:  OMNIPAQUE  IOHEXOL  300 MG/ML  SOLN COMPARISON:  CT abdomen and pelvis 05/11/2024. MRI of the abdomen 05/13/2024. FINDINGS: Lower chest: There scattered pulmonary nodules in the bilateral lower lobes with areas of atelectasis. Nodules measure up 8 mm in the appear grossly unchanged. Hepatobiliary: The gallbladder is contracted or absent. Percutaneous biliary stent is in place ending in the duodenal which is new from prior. There are numerous metastatic lesions throughout the liver. These have increased in size and number, particularly in the left lobe of the liver and caudate. Metastatic disease is now seen extending along the left portal triad. Left portal vein is not visualized, progression from prior. There also new subcapsular fluid collections with enhancing borders along the inferior margin of the liver measuring up to 3.9 cm. There surrounding stranding. Pancreas: Unremarkable. No pancreatic ductal dilatation or surrounding inflammatory changes. Spleen: Normal in size without focal abnormality. Adrenals/Urinary Tract: Bilateral adrenal nodules are stable from prior. Bilateral kidneys and bladder are within normal limits. Stomach/Bowel: There is wall thickening and inflammation of the ascending colon as it abuts the inferior margin of the liver and subcapsular fluid collections. The appendix is not seen. There is no bowel obstruction, pneumatosis or free air. Vascular/Lymphatic: Aorta and IVC are normal in size. There are atherosclerotic calcifications of the aorta. No discrete enlarged lymph nodes are identified. Reproductive: The uterus is surgically absent. Nodular implant overlying the left vaginal  cuff measures up to 4 cm, unchanged from prior. Other: There is a new small amount of ascites in all 4 abdominal quadrants. There is mild body wall edema. Peritoneal implants in the anterior abdomen have not  significantly changed measuring up to 2.3 cm. Musculoskeletal: Lumbar fusion hardware again noted. No acute osseous abnormality. IMPRESSION: 1. New percutaneous biliary stent in place. 2. Increasing size and number of hepatic metastatic lesions. 3. New subcapsular fluid collections along the inferior margin of the liver with surrounding inflammation. Findings may be related to infection or metastatic disease. 4. Wall thickening and inflammation of the ascending colon as it abuts the inferior margin of the liver and subcapsular fluid collections. Findings may be reactive to adjacent liver disease. 5. New small amount of ascites. 6. Stable peritoneal implants. 7. Stable bilateral adrenal nodules. 8. Stable pulmonary nodules in the lung bases. Aortic Atherosclerosis (ICD10-I70.0). Electronically Signed   By: Greig Pique M.D.   On: 05/25/2024 15:44    Mennie LAMY MD [facility]  If 7PM-7AM, please contact night-coverage [email or web address]  05/25/2024, 4:53 PM

## 2024-05-25 NOTE — ED Triage Notes (Signed)
 BIB EMS from home. Pt. C/O R.upper abdominal pain. Stage 4 gallbladder cancer. Pt had a procedure a week ago; drain placed to R. Abdominal side. Pt states she feels very weak, belly is distended & jaundice appearance. EMS administered 200 mcg fentanyl .

## 2024-05-26 ENCOUNTER — Encounter (HOSPITAL_COMMUNITY): Payer: Self-pay | Admitting: Internal Medicine

## 2024-05-26 ENCOUNTER — Telehealth: Payer: Self-pay | Admitting: Oncology

## 2024-05-26 DIAGNOSIS — N179 Acute kidney failure, unspecified: Secondary | ICD-10-CM

## 2024-05-26 DIAGNOSIS — Z515 Encounter for palliative care: Secondary | ICD-10-CM

## 2024-05-26 DIAGNOSIS — Z7189 Other specified counseling: Secondary | ICD-10-CM

## 2024-05-26 DIAGNOSIS — G893 Neoplasm related pain (acute) (chronic): Principal | ICD-10-CM

## 2024-05-26 DIAGNOSIS — C786 Secondary malignant neoplasm of retroperitoneum and peritoneum: Secondary | ICD-10-CM

## 2024-05-26 DIAGNOSIS — R1011 Right upper quadrant pain: Secondary | ICD-10-CM | POA: Diagnosis not present

## 2024-05-26 LAB — BASIC METABOLIC PANEL WITH GFR
Anion gap: 9 (ref 5–15)
BUN: 27 mg/dL — ABNORMAL HIGH (ref 8–23)
CO2: 21 mmol/L — ABNORMAL LOW (ref 22–32)
Calcium: 8.6 mg/dL — ABNORMAL LOW (ref 8.9–10.3)
Chloride: 108 mmol/L (ref 98–111)
Creatinine, Ser: 1.03 mg/dL — ABNORMAL HIGH (ref 0.44–1.00)
GFR, Estimated: 56 mL/min — ABNORMAL LOW (ref >60)
Glucose, Bld: 103 mg/dL — ABNORMAL HIGH (ref 70–99)
Potassium: 3.7 mmol/L (ref 3.5–5.1)
Sodium: 138 mmol/L (ref 135–145)

## 2024-05-26 LAB — CBC
HCT: 24.4 % — ABNORMAL LOW (ref 36.0–46.0)
Hemoglobin: 8.1 g/dL — ABNORMAL LOW (ref 12.0–15.0)
MCH: 33.1 pg (ref 26.0–34.0)
MCHC: 33.2 g/dL (ref 30.0–36.0)
MCV: 99.6 fL (ref 80.0–100.0)
Platelets: 113 K/uL — ABNORMAL LOW (ref 150–400)
RBC: 2.45 MIL/uL — ABNORMAL LOW (ref 3.87–5.11)
RDW: 19.8 % — ABNORMAL HIGH (ref 11.5–15.5)
WBC: 11 K/uL — ABNORMAL HIGH (ref 4.0–10.5)
nRBC: 0 % (ref 0.0–0.2)

## 2024-05-26 NOTE — Consult Note (Cosign Needed Addendum)
 Palliative Care Consult Note                                  Date: 05/26/2024   Patient Name: Janice Pace  DOB: February 15, 1946  MRN: [id number]  Age / Sex: 78 y.o., female  PCP: [name] Referring Physician: Jonel Lonni SQUIBB, *  Reason for Consultation: Establishing goals of care  HPI/Patient Profile: 78 y.o. female  with past medical history of stage IV gallbladder adenocarcinoma with peritoneal and lung metastasis currently on immunotherapy, COPD/asthma, hypertension, cancer related pain, left portal vein thrombosis on Eliquis, and GERD.  She was recently admitted 05/11/2024 through 05/21/2024 with a obstructive jaundice from tumor burden and is status post biliary drain placement on 11/5.  She returned to the ED on 05/25/2024 with right upper abdominal pain, weakness, abdominal distention, and jaundice. CT showed disease progression.   Palliative Medicine has been consulted for goals of care discussions and complex medical decision making.   Clinical Assessment and Goals of Care:   Extensive chart review has been completed prior including labs, vital signs, imaging, progress/consult notes, orders, medications and available advance directive documents.    Per MAR review: In the past 24 hours, patient has received 0.5 mg IV Dilaudid  x 4 doses, oxycodone  IR 10 mg x 1 dose. She is also on MS Contin  15 mg every 12 hours.   Discussed with Dr. Jonel. Oncology has recommended against further chemotherapy  I met with husband and daughter/[name] to discuss diagnosis, prognosis, GOC, EOL wishes, and disposition. Patient is lethargic and delirious; she is unable to participate in meaningful conversation.   Patient is known to PMT from her hospitalization in June 2025. I re-introduced Palliative Medicine as specialized medical care for people with serious illness.   Created space and opportunity for family to express thoughts and  feelings regarding current medical situation. Values and goals of care were attempted to be elicited.  Social History: Patient lives at home with her husband.  They share 2 daughters and 1 son, 6 grandchildren, and 5 great-grandchildren.  Discussion: We discussed patient's current medical situation and what it means in the larger context of her ongoing co-morbidities. Current clinical status was reviewed. Natural trajectory at end-of-life was reviewed.  Reviewed plan to discharge home with hospice ([facility]), and focus on comfort and dignity for the time she has left. Reviewed that patient has high symptom burden and terminal delirium.   We discussed symptom management with regard to ensuring her pain is well-controlled on oral medication prior to discharge home.  Discussed recommendation to use oxycodone  IR first, and use IV Dilaudid  as a backup for severe pain not relieved by oxycodone .    Questions and concerns addressed. Emotional support provided.    Review of Systems  Unable to perform ROS Gastrointestinal:  Positive for abdominal pain.    Objective:   Primary Diagnoses: Present on Admission:  Pain in the abdomen  Prediabetes  GERD (gastroesophageal reflux disease)  COPD with asthma (HCC)  Generalized anxiety disorder  Essential hypertension  Carcinomatosis peritonei (HCC)  Carcinoma of gall bladder (HCC)  Abdominal pain   Physical Exam Vitals reviewed.  Constitutional:      General: She is sleeping. She is not in acute distress.    Appearance: She is ill-appearing.  Pulmonary:     Effort: Pulmonary effort is normal.  Neurological:     Mental Status: She is lethargic  and confused.     Palliative Assessment/Data: PPS 20%     Assessment & Plan:   SUMMARY OF RECOMMENDATIONS   Continue current supportive care, with focus on comfort and symptom management Plan for home with hospice - [facility] If pain is not well-controlled on oral medication, would  recommend consideration of inpatient hospice facility PMT will continue to follow and support   Symptom Management:  Cancer-related Pain Continue MS Contin  15 mg every 12 hours Continue Oxycodone  IR 10-15 mg every 4 hours as needed for pain (use first-line) Dilaudid  0.5 mg IV every 6 hours as needed for severe pain (not relieved by oxycodone ) Nausea Continue ondansetron  (Zofran ) 4 mg p.o. or IV every 6 hours as needed   Primary Decision Maker: HCPOA  Existing Vynca/ACP Documentation: HCPOA document designating Asher Blunt (daughter) as healthcare agent and Grayce Molt (daughter) as alternate Living will documents dating patient's desire not to have life-prolonging measures if she has an incurable condition, is unconscious, or has advanced dementia  Code Status/Advance Care Planning: DNR - Limited   Prognosis:  < 6 months  Discharge Planning:  Home with Hospice   Discussed with: Dr. Jonel, [facility], RN, and hospice liaison    Thank you for allowing us  to participate in the care of Sherrilyn LITTIE Blush  Billing based on MDM: High  Problems Addressed: One or more chronic illnesses with severe exacerbation, progression, or side effects of treatment.  Amount and/or Complexity of Data: Category 1:Review of prior external note(s) from each unique source, Review of the result(s) of each unique test, and Assessment requiring an independent historian(s)  Risks: Parenteral controlled substances    Signed by: Recardo Loll, NP Palliative Medicine Team  Team Phone # [phone]  For individual providers, please see AMION

## 2024-05-26 NOTE — Progress Notes (Signed)
 [facility] Liaison Note  Received request from [facility], for hospice services at home after discharge. Spoke with Grayce (daughter) to initiate education related to hospice philosophy, services, and team approach to care.  [name] verbalized understanding of information given.  Per discussion, the plan is for discharge home by private vehicle after the hospital bed has been delivered.  DME needs discussed. Patient has the following equipment in the home:  bedside commode.[name] requests the following equipment for deliver:  Hospital bed, Over bed table, wheelchair  The address has been verified and is correct in the chart.  Grayce Molt [phone] is the family contact to arrange time of equipment delivery.  Please send signed and completed DNR home with patient/family.  Please provide prescriptions at discharge as needed to ensure ongoing symptom management.  [facility] information and contact numbers given to [name].  Above information shared with [facility], [employer].  Please call with any questions or concerns.  Thank you for the opportunity to participate in this patient's care.  Inocente Jacobs BSN RN [facility] Liaison [phone]

## 2024-05-26 NOTE — TOC Progression Note (Addendum)
 Transition of Care [facility]) - Progression Note    Patient Details  Name: Janice Pace MRN: [id number] Date of Birth: 29-Mar-1946  [facility]) CM/SW Contact  Toy LITTIE Agar, RN Phone Number:[phone]  05/26/2024, 2:00 PM  Clinical Narrative:    CM received message for MD requesting Hospice referral. CM at bedside with patient and daughter Quentin). Per Grayce she spoke with liaison from [facility] yesterday but has not made a final decision on agency. CM has given daughter CM phone number. Daughter states she will update CM after she speaks with her sister. Team has been updated.   1419  Daughter Grayce called CM to update on choice of [facility]. Inocente with [facility] has been updated.      Barriers to Discharge: Continued Medical Work up               Expected Discharge Plan and Services                                               Social Drivers of Health (SDOH) Interventions SDOH Screenings   Food Insecurity: No Food Insecurity (05/25/2024)  Housing: Low Risk  (05/25/2024)  Transportation Needs: No Transportation Needs (05/25/2024)  Utilities: Not At Risk (05/25/2024)  Alcohol Screen: Low Risk  (08/25/2023)  Depression (PHQ2-9): Low Risk  (05/09/2024)  Financial Resource Strain: Low Risk  (08/25/2023)  Physical Activity: Inactive (08/25/2023)  Social Connections: Socially Integrated (05/25/2024)  Stress: No Stress Concern Present (08/25/2023)  Tobacco Use: Medium Risk (05/26/2024)  Health Literacy: Adequate Health Literacy (06/03/2023)    Readmission Risk Interventions    05/13/2024    1:19 PM  Readmission Risk Prevention Plan  Transportation Screening Complete  PCP or Specialist Appt within 3-5 Days Complete  HRI or Home Care Consult Complete  Social Work Consult for Recovery Care Planning/Counseling Complete  Palliative Care Screening Not Applicable  Medication Review [employer]) Complete

## 2024-05-26 NOTE — Telephone Encounter (Signed)
 I left voicemail for patient regarding scheduled hospital follow up visit with Dr. Autumn on 06/16/2024. I advised patient to return my call if date/time does not work for her.

## 2024-05-26 NOTE — Plan of Care (Signed)
?  Problem: Education: Goal: Knowledge of General Education information will improve Description: Including pain rating scale, medication(s)/side effects and non-pharmacologic comfort measures Outcome: Progressing   Problem: Health Behavior/Discharge Planning: Goal: Ability to manage health-related needs will improve Outcome: Progressing   Problem: Elimination: Goal: Will not experience complications related to bowel motility Outcome: Progressing Goal: Will not experience complications related to urinary retention Outcome: Progressing   Problem: Safety: Goal: Ability to remain free from injury will improve Outcome: Progressing   Problem: Skin Integrity: Goal: Risk for impaired skin integrity will decrease Outcome: Progressing   

## 2024-05-26 NOTE — Progress Notes (Signed)
  Progress Note   Patient: Janice Pace FMW:[id number] DOB: 1945-09-22 DOA: 05/25/2024     1 DOS: the patient was seen and examined on 05/26/2024        Brief hospital course: 78 y.o. 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   CT showed progression of disease, started on antibiotics in hope that some of symptoms are infectious.  Oncology and Palliative consulted.        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  Oncology recommended against further chemotherapy, and transition to hospice, and family were in agreement.  She has terminal delirium, and a high burden of pain. - Continue Eliquis, Breztri , metoprolol , Crestor  as tolerated - Transition to comfort - May continue Megace , oxycodone , hydromorphone  - No improvement with Zosyn, will give additional 24 hours then stop if no improvement in functional status or alertness            Subjective: The patient is sluggish, she has pain in her right side.  She has no fever, no respiratory distress     Physical Exam: BP 117/60 (BP Location: Right Arm)    Pulse 77   Temp 98.8 F (37.1 C) (Oral)   Resp 16   Ht 5' 2 (1.575 m)   Wt 72.1 kg   SpO2 99%   BMI 29.08 kg/m   Jaundiced elderly adult female, lying in bed, weak and tired RRR, no murmurs, heart sounds distant, no pitting edema Respiratory effort weak, lung sounds diminished, no rales or wheezes appreciated Abdomen tender, discomfort mostly in the right upper quadrant, overall abdomen firm Attention diminished, affect blunted, judgment and insight appear impaired  Data Reviewed: Discussed with oncology and palliative care CBC shows normal electrolytes, normal renal function CBC shows mild leukocytosis, persistent anemia and thrombocytopenia      Family Communication: Husband and daughter at the bedside    Disposition: Status is: Inpatient         Author: Lonni SHAUNNA Dalton, MD 05/26/2024 6:20 PM  For on call review [email or web address].

## 2024-05-26 NOTE — Progress Notes (Cosign Needed)
 Janice Pace   DOB:06/02/1946   FM#:[id number]      ASSESSMENT & PLAN:  Janice Pace is a 78 year old female patient who was admitted from home on 05/25/2024 with complaints of abdominal pain.  Oncologic history is significant for stage IV gallbladder carcinoma with peritoneal and lung mets.  Medical oncology following.  Gallbladder carcinoma, stage IV  Previous history of peritoneal and lung mets. Worsening liver mets. Obstructive jaundice -Diagnosed 12/15/2023 with metastatic well to moderately differentiated adenocarcinoma - Imaging done 05/25/2024 shows increasing size and number of hepatic metastatic lesions.  Stable peritoneal implants.  Stable bilateral adrenal nodules.  Stable pulmonary nodules.  There is wall thickening and inflammation of ascending colon. - At this time due to progression of disease and poor performance status, we recommend home hospice.  Discussed with family who are on board.   - Medical oncology/Dr. Pasam following closely  Altered mental status - Okay with CT of head to see what is causing mental status change.  Hyperbilirubinemia Transaminitis Jaundice - Secondary to malignancy. - Bilirubin and LFTs appear to be slightly better although patient still appears very jaundiced.  Left portal vein thrombosis - Previously noted on MRI/MRCP - Has been on Eliquis 2.5 mg twice daily instead of full-strength anticoagulation.    Code Status DNR-limited  Subjective:  Patient seen awake and somewhat alert although somnolent laying in bed.  Patient's husband and daughter at bedside.  Family reports that she has had very little appetite.  When asked about abdominal pain patient said she felt okay although has been complaining of pain.  She is very weak, jaundiced, and ill-appearing.    Objective:   Intake/Output Summary (Last 24 hours) at 05/26/2024 1052 Last data filed at 05/26/2024 1049 Gross per 24 hour  Intake 1159.68 ml  Output 450 ml  Net 709.68  ml     PHYSICAL EXAMINATION: ECOG PERFORMANCE STATUS: 4 - Bedbound  Vitals:   05/26/24 0452 05/26/24 0834  BP: (!) 112/57   Pulse: 79   Resp: 18   Temp: 98.4 F (36.9 C)   SpO2: 97% 97%   Filed Weights   05/25/24 1922  Weight: 159 lb (72.1 kg)    GENERAL: + Somewhat alert + somnolent + chronically ill-appearing  SKIN: + Jaundiced Skin color, texture, turgor are normal, no rashes or significant lesions EYES: + Scleral icterus OROPHARYNX: no exudate, no erythema and lips, buccal mucosa, and tongue normal  NECK: supple, thyroid  normal size, non-tender, without nodularity LYMPH: no palpable lymphadenopathy in the cervical, axillary or inguinal LUNGS: clear to auscultation and percussion with normal breathing effort HEART: regular rate & rhythm and no murmurs and no lower extremity edema ABDOMEN: abdomen soft, non-tender and normal bowel sounds MUSCULOSKELETAL: no cyanosis of digits and no clubbing  PSYCH: + Altered mentation NEURO: no focal motor/sensory deficits   All questions were answered. The patient knows to call the clinic with any problems, questions or concerns.   The total time spent in the appointment was 40 minutes encounter with patient including review of chart and various tests results, discussions about plan of care and coordination of care plan  Olam JINNY Brunner, NP 05/26/2024 10:52 AM    Labs Reviewed:  Lab Results  Component Value Date   WBC 11.0 (H) 05/26/2024   HGB 8.1 (L) 05/26/2024   HCT 24.4 (L) 05/26/2024   MCV 99.6 05/26/2024   PLT 113 (L) 05/26/2024   Recent Labs    05/14/24 0420 05/15/24 0441 05/20/24 0500 05/21/24  0540 05/25/24 1205 05/26/24 0600  NA 138   < > 137 136 137 138  K 3.9   < > 3.4* 3.9 3.7 3.7  CL 105   < > 106 107 105 108  CO2 23   < > 20* 21* 20* 21*  GLUCOSE 108*   < > 107* 110* 109* 103*  BUN 11   < > 11 12 29* 27*  CREATININE 0.81   < > 0.67 0.79 1.06* 1.03*  CALCIUM  8.3*   < > 8.5* 8.8* 9.1 8.6*  GFRNONAA >60    < > >60 >60 54* 56*  PROT 5.2*   < > 5.3* 5.5* 5.4*  --   ALBUMIN  3.1*   < > 2.9* 2.9* 2.7*  --   AST 79*   < > 44* 32 52*  --   ALT 103*   < > 53* 42 38  --   ALKPHOS 191*   < > 192* 168* 131*  --   BILITOT 6.8*   < > 5.6* 6.9* 5.3*  --   BILIDIR 5.0*  --   --   --   --   --   IBILI 1.8*  --   --   --   --   --    < > = values in this interval not displayed.    Studies Reviewed:  CT ABDOMEN AND PELVIS WITH CONTRAST 05/25/2024  IMPRESSION: 1. New percutaneous biliary stent in place. 2. Increasing size and number of hepatic metastatic lesions. 3. New subcapsular fluid collections along the inferior margin of the liver with surrounding inflammation. Findings may be related to infection or metastatic disease. 4. Wall thickening and inflammation of the ascending colon as it abuts the inferior margin of the liver and subcapsular fluid collections. Findings may be reactive to adjacent liver disease. 5. New small amount of ascites. 6. Stable peritoneal implants. 7. Stable bilateral adrenal nodules. 8. Stable pulmonary nodules in the lung bases.

## 2024-05-26 NOTE — Plan of Care (Signed)

## 2024-05-27 ENCOUNTER — Other Ambulatory Visit: Payer: Self-pay

## 2024-05-27 DIAGNOSIS — C23 Malignant neoplasm of gallbladder: Secondary | ICD-10-CM

## 2024-05-27 DIAGNOSIS — C799 Secondary malignant neoplasm of unspecified site: Secondary | ICD-10-CM

## 2024-05-27 MED ORDER — SODIUM CHLORIDE 0.9% FLUSH: 10.0000 mL | INTRAVENOUS | Status: DC | PRN

## 2024-05-27 MED ORDER — MORPHINE SULFATE (CONCENTRATE) 10 MG /0.5 ML PO SOLN: 20.0000 mg | ORAL | Status: DC | PRN

## 2024-05-27 MED ORDER — CHLORHEXIDINE GLUCONATE CLOTH 2 % EX PADS
6.0000 | MEDICATED_PAD | Freq: Every day | CUTANEOUS | Status: DC
  Administered 2024-05-27 – 2024-05-28 (×2): 6 via TOPICAL

## 2024-05-27 MED ORDER — LORAZEPAM 2 MG/ML PO CONC: 2.0000 mg | ORAL | Status: DC | PRN

## 2024-05-27 MED ORDER — LORAZEPAM 2 MG/ML IJ SOLN: 1.0000 mg | INTRAMUSCULAR | Status: DC | PRN

## 2024-05-27 MED ORDER — SODIUM CHLORIDE 0.9% FLUSH
10.0000 mL | Freq: Two times a day (BID) | INTRAVENOUS | Status: DC
  Administered 2024-05-27 – 2024-05-28 (×3): 10 mL

## 2024-05-27 MED ORDER — LORAZEPAM 1 MG PO TABS: 2.0000 mg | ORAL_TABLET | ORAL | Status: DC | PRN

## 2024-05-27 MED ORDER — MORPHINE SULFATE (CONCENTRATE) 10 MG /0.5 ML PO SOLN
10.0000 mg | ORAL | Status: DC | PRN
  Administered 2024-05-27: 20 mg via SUBLINGUAL
  Filled 2024-05-27: qty 1

## 2024-05-27 NOTE — Progress Notes (Signed)
 Palliative Medicine Progress Note   Patient Name: Janice Pace       Date: 05/27/2024 DOB: 1946-06-23  Age: 78 y.o. MRN#: [id number] Attending Physician: Jonel Lonni SQUIBB, * Primary Care Physician: [name] Admit Date: 05/25/2024   HPI/Patient Profile: 78 y.o. female  with past medical history of stage IV gallbladder adenocarcinoma with peritoneal and lung metastasis currently on immunotherapy, COPD/asthma, hypertension, cancer related pain, left portal vein thrombosis on Eliquis, and GERD.  She was recently admitted 05/11/2024 through 05/21/2024 with a obstructive jaundice from tumor burden and is status post biliary drain placement on 11/5.  She returned to the ED on 05/25/2024 with right upper abdominal pain, weakness, abdominal distention, and jaundice. CT showed disease progression.    Palliative Medicine has been consulted for goals of care discussions and complex medical decision making.   Subjective: Chart reviewed. Per MAR, in the past 24 hours patient received IV dilaudid  x 1 dose and oxycodone  IR x 1 dose.   Bedside visit - patient is restless, appears uncomfortable, intermittently moaning. She remains confused/delirious. I have asked her RN to administer a dose of prn ativan.   I spoke with daughter/[name] by phone. Earlier today, she was considering transfer to inpatient hospice facility. However after discussion with [facility] liaison, she has decided to proceed with home hospice as consistent with patient's previously expressed wishes. We discussed that if patient's symptoms are not well managed at home, there is always the option to transfer to hospice facility. Questions and concerns addressed. Emotional support provided.    Objective:  Physical Exam Vitals  reviewed.  Constitutional:      Appearance: She is ill-appearing.  Pulmonary:     Effort: Pulmonary effort is normal.  Neurological:     Mental Status: She is lethargic and confused.        Palliative Medicine Assessment & Plan   Assessment: Principal Problem:   Pain in the abdomen Active Problems:   Prediabetes   GERD (gastroesophageal reflux disease)   COPD with asthma (HCC)   Generalized anxiety disorder   Essential hypertension   Carcinomatosis peritonei (HCC)   Carcinoma of gall bladder (HCC)   Abdominal pain   AKI (acute kidney injury)    Recommendations/Plan: Continue current supportive care, with focus on comfort and symptom management Plan for home with  hospice - [facility] Daughter understands that if symptoms are not well managed at home, they can opt for transfer to hospice facility PMT will continue to follow and support     Symptom Management:  Cancer-related Pain: Continue MS Contin  15 mg every 12 hours Continue Oxycodone  IR 10-15 mg every 4 hours as needed for pain (use first-line) Dilaudid  0.5 mg IV every 6 hours as needed for severe pain (not relieved by oxycodone ) Nausea: Continue ondansetron  (Zofran ) 4 mg p.o. or IV every 6 hours as needed  Delirium (terminal): ativan 2 mg PO or 1-2 mg IV every 4 hours as needed for anxiety/sedation  Primary Decision Maker: HCPOA  Code Status: DNR  Discharge Planning: Home with Hospice   Thank you for allowing the Palliative Medicine Team to assist in the care of this patient.   Time: 38 minutes  Detailed review of medical records (labs, imaging, vital signs), medically appropriate exam, discussed with treatment team, counseling and education to patient, family, & staff, documenting clinical information, medication management, coordination of care.    Dee Maday B Jahzion Brogden, NP   Please contact Palliative Medicine Team phone at [phone] for questions and concerns.  For individual providers, please  see AMION.

## 2024-05-27 NOTE — Progress Notes (Signed)
  Progress Note   Patient: Janice Pace FMW:[id number] DOB: 07-14-1946 DOA: 05/25/2024     2 DOS: the patient was seen and examined on 05/27/2024        Brief hospital course: 78 y.o. 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   CT showed progression of disease, started on antibiotics in hope that some of symptoms are infectious.  Oncology and Palliative consulted.        Assessment and plan: Failure to thrive Gallbladder cancer, metastatic to peritoneum and lung History of biliary stent for obstructive jaundice Obstructive jaundice Acute kidney injury Acute metabolic encephalopathy due to terminal delirium Portal vein thrombosis Metabolic acidosis, acute Hypertension Anemia of malignancy Thrombocytopenia History of COPD with asthma Transitioned to comfort care yesterday.  Overnight quite agitated and delirious.  Requiring multiple doses of IV medication. - Stop Eliquis, Prestridge, Crestor  - Transition to comfort - Hold Megace  - Stop Zosyn - Transition to oral concentrated morphine , oral concentrated Ativan - Monitor overnight, if stable on concentrated symptomatic relief medications, can discharge home tomorrow with hospice following otherwise may need residential hospice for intensive symptom management         Subjective: Agitated overnight, more subdued this morning.  Still very disoriented.  No respiratory distress.  No vomiting.  Eating a few bites of food.     Physical Exam: BP 118/63 (BP Location: Right Arm)   Pulse 80   Temp 97.6 F (36.4 C) (Oral)   Resp 16   Ht 5' 2 (1.575 m)   Wt 72.1 kg   SpO2 98%   BMI 29.08 kg/m    Jaundiced elderly female, lying in bed, resting but opens eyes and makes eye contact, then closes eyes, makes no intelligible responses to questions Heart rate normal, no murmurs, no peripheral edema Abdomen with tenderness to palpation of the right side Attention diminished, confused, generalized weakness, symmetric strength      Family Communication: Husband and daughters at the bedside    Disposition: Status is: Inpatient         Author: Lonni SHAUNNA Dalton, MD 05/27/2024 5:24 PM  For on call review [email or web address]

## 2024-05-28 ENCOUNTER — Other Ambulatory Visit (HOSPITAL_COMMUNITY): Payer: Self-pay

## 2024-05-28 DIAGNOSIS — R1011 Right upper quadrant pain: Secondary | ICD-10-CM | POA: Diagnosis not present

## 2024-05-28 LAB — GLUCOSE, CAPILLARY: Glucose-Capillary: 118 mg/dL — ABNORMAL HIGH (ref 70–99)

## 2024-05-28 MED ORDER — ONDANSETRON HCL 8 MG PO TABS
8.0000 mg | ORAL_TABLET | Freq: Three times a day (TID) | ORAL | 0 refills | Status: DC | PRN
  Filled 2024-05-28: qty 30, 10d supply, fill #0

## 2024-05-28 MED ORDER — HEPARIN SOD (PORK) LOCK FLUSH 100 UNIT/ML IV SOLN
INTRAVENOUS | Status: AC
  Filled 2024-05-28: qty 5

## 2024-05-28 MED ORDER — MORPHINE SULFATE (CONCENTRATE) 10 MG /0.5 ML PO SOLN
10.0000 mg | ORAL | 0 refills | Status: DC | PRN
  Filled 2024-05-28: qty 30, 4d supply, fill #0

## 2024-05-28 MED ORDER — LORAZEPAM 2 MG/ML PO CONC
1.0000 mg | ORAL | 0 refills | Status: DC | PRN
  Filled 2024-05-28: qty 30, 10d supply, fill #0

## 2024-05-28 NOTE — Discharge Summary (Signed)
 Physician Discharge Summary   Patient: Janice Pace MRN: [id number] DOB: 1946-02-18  Admit date:     05/25/2024  Discharge date: 05/28/24  Discharge Physician: Lonni SHAUNNA Dalton   PCP: Gayle Saddie FALCON, PA-C     Recommendations at discharge:  Discharged with home hospice     78 y.o. 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   CT showed progression of disease, started on antibiotics in hope that some of symptoms are infectious.  Oncology and Palliative consulted.     Discharge diagnoses: Failure to thrive Gallbladder cancer, metastatic to peritoneum and lung History of biliary stent for obstructive jaundice Obstructive jaundice Acute kidney injury Acute metabolic encephalopathy due to terminal delirium Portal vein thrombosis Metabolic acidosis, acute Hypertension Anemia of malignancy Thrombocytopenia History of COPD with asthma  Patient was admitted on broad spectrum antibiotics.  Oncology and Palliative care were consulted, and after discussion, the patient elected to transition to comfort care.    Hospice was arranged, home services were set up and she was provided with concentrated morphine  and Ativan.              The [place]  Controlled Substances Registry was reviewed for this patient prior to discharge.  Consultants: Oncology Palliative Care Hospice   Disposition: Hospice care Diet recommendation:  Discharge Diet Orders (From admission, onward)     Start     Ordered   05/28/24 0000  Diet - low sodium heart healthy        05/28/24 0954             DISCHARGE MEDICATION: Allergies as of 05/28/2024        Reactions   Sulfa Antibiotics Hives, Itching   Forteo  [parathyroid Hormone (recomb)] Other (See Comments)   Too many side effects   Atorvastatin Other (See Comments)   Muscle aches   Metformin Hcl Other (See Comments)   Joint pains and muscle aches        Medication List     STOP taking these medications    Eliquis 2.5 MG Tabs tablet Generic drug: apixaban   fluconazole  200 MG tablet Commonly known as: DIFLUCAN    loratadine  10 MG tablet Commonly known as: CLARITIN    losartan 50 MG tablet Commonly known as: COZAAR   Oxycodone  HCl 10 MG Tabs   rosuvastatin  10 MG tablet Commonly known as: CRESTOR    spironolactone  25 MG tablet Commonly known as: ALDACTONE        TAKE these medications    albuterol  108 (90 Base) MCG/ACT inhaler Commonly known as: VENTOLIN  HFA Inhale 1-2 puffs into the lungs every 6 (six) hours as needed for wheezing or shortness of breath.   Breztri  Aerosphere 160-9-4.8 MCG/ACT Aero inhaler Generic drug: budesonide -glycopyrrolate -formoterol  Inhale 2 puffs into the lungs in the morning and at bedtime.   docusate sodium  100 MG capsule Commonly known as: COLACE Take 1 capsule (100 mg total) by mouth 2 (two) times daily.   lactulose 10 GM/15ML solution Commonly known as: CHRONULAC Take 30 mLs (20 g total) by mouth 2 (two) times daily.   lidocaine -prilocaine  cream Commonly known as: EMLA  Apply to affected area once What changed:  how much to take how to take this when to take this reasons  to take this additional instructions   LORazepam 2 MG/ML concentrated solution Commonly known as: LORazepam Intensol Take 0.5 mLs (1 mg total) by mouth every 4 (four) hours as needed for anxiety.   magic mouthwash (nystatin, lidocaine , diphenhydrAMINE , alum & mag hydroxide) suspension Take 5 mLs by mouth every 4 (four) hours as needed for mouth pain. Suspension contains equal amounts of Maalox, nystatin, diphenhydramine  and lidocaine . 1:1:1:1 ratio.    magnesium  oxide 400 (240 Mg) MG tablet Commonly known as: MAG-OX Take 1 tablet (400 mg total) by mouth 2 (two) times daily.   megestrol  625 MG/5ML suspension Commonly known as: MEGACE  ES Take 5 mLs (625 mg total) by mouth daily.   metoprolol  succinate 50 MG 24 hr tablet Commonly known as: TOPROL -XL TAKE 1 TABLET BY MOUTH ONCE DAILY. TAKE WITH OR IMMEDIATELY FOLLOWING A MEAL What changed: See the new instructions.   morphine  15 MG 12 hr tablet Commonly known as: MS CONTIN  Take 1 tablet (15 mg total) by mouth every 12 (twelve) hours. What changed: Another medication with the same name was added. Make sure you understand how and when to take each.   morphine  CONCENTRATE 10 mg / 0.5 ml concentrated solution Place 0.5-1 mLs (10-20 mg total) under the tongue every 2 (two) hours as needed for severe pain (pain score 7-10) or shortness of breath. What changed: You were already taking a medication with the same name, and this prescription was added. Make sure you understand how and when to take each.   Normal Saline Flush 0.9 % Soln Flush abdominal drain with 5 ml(cc) sterile saline once daily   omeprazole  20 MG capsule Commonly known as: PRILOSEC Take 1 capsule (20 mg total) by mouth daily. What changed: when to take this   ondansetron  8 MG tablet Commonly known as: ZOFRAN  Take 1 tablet (8 mg total) by mouth every 8 (eight) hours as needed for nausea or vomiting. Start on the third day after cisplatin .   polyethylene glycol 17 g packet Commonly known as: MIRALAX  / GLYCOLAX  Take 17 g by mouth 2 (two) times daily as needed for mild constipation or moderate constipation.   prochlorperazine  10 MG tablet Commonly known as: COMPAZINE  Take 1 tablet (10 mg total) by mouth every 6 (six) hours as needed (Nausea or vomiting).         Discharge Instructions     Diet - low sodium heart healthy   Complete by: As directed    Discharge instructions   Complete by: As directed    The  morphine  is for pain or trouble breathing or distress related to pain/trouble breathing.  You may use 10-20 mg (0.5-60mL) at a time You may use more if you discuss with Hospice  The lorazepam/Ativan is for anxiety, fear or agitation You may use 1-2 mg (0.5-72mL) at a time   Increase activity slowly   Complete by: As directed        Discharge Exam: Filed Weights   05/25/24 1922  Weight: 72.1 kg    General: Patient is mildly jaundiced, tired, wakes briefly but does not engage with me in much dialogue Cardiovascular: Tacycardic, regular, no murmurs Respiratory: Normal respiratory rate and rhythm.  CTAB without rales or wheezes. Abdominal: Grimace to palpation Neuro/Psych: Severe generalized weakness.  Judgment and insight appear impaired, responses slowed and mostly inattentive.   Condition at discharge: worsening  The results of significant diagnostics from this hospitalization (including imaging, microbiology, ancillary and laboratory) are listed below for reference.   Imaging  Studies: CT ABDOMEN PELVIS W CONTRAST Result Date: 05/25/2024 CLINICAL DATA:  Acute abdominal pain. History of gallbladder cancer. EXAM: CT ABDOMEN AND PELVIS WITH CONTRAST TECHNIQUE: Multidetector CT imaging of the abdomen and pelvis was performed using the standard protocol following bolus administration of intravenous contrast. RADIATION DOSE REDUCTION: This exam was performed according to the departmental dose-optimization program which includes automated exposure control, adjustment of the mA and/or kV according to patient size and/or use of iterative reconstruction technique. CONTRAST:  OMNIPAQUE  IOHEXOL  300 MG/ML  SOLN COMPARISON:  CT abdomen and pelvis 05/11/2024. MRI of the abdomen 05/13/2024. FINDINGS: Lower chest: There scattered pulmonary nodules in the bilateral lower lobes with areas of atelectasis. Nodules measure up 8 mm in the appear grossly unchanged. Hepatobiliary: The gallbladder is  contracted or absent. Percutaneous biliary stent is in place ending in the duodenal which is new from prior. There are numerous metastatic lesions throughout the liver. These have increased in size and number, particularly in the left lobe of the liver and caudate. Metastatic disease is now seen extending along the left portal triad. Left portal vein is not visualized, progression from prior. There also new subcapsular fluid collections with enhancing borders along the inferior margin of the liver measuring up to 3.9 cm. There surrounding stranding. Pancreas: Unremarkable. No pancreatic ductal dilatation or surrounding inflammatory changes. Spleen: Normal in size without focal abnormality. Adrenals/Urinary Tract: Bilateral adrenal nodules are stable from prior. Bilateral kidneys and bladder are within normal limits. Stomach/Bowel: There is wall thickening and inflammation of the ascending colon as it abuts the inferior margin of the liver and subcapsular fluid collections. The appendix is not seen. There is no bowel obstruction, pneumatosis or free air. Vascular/Lymphatic: Aorta and IVC are normal in size. There are atherosclerotic calcifications of the aorta. No discrete enlarged lymph nodes are identified. Reproductive: The uterus is surgically absent. Nodular implant overlying the left vaginal cuff measures up to 4 cm, unchanged from prior. Other: There is a new small amount of ascites in all 4 abdominal quadrants. There is mild body wall edema. Peritoneal implants in the anterior abdomen have not significantly changed measuring up to 2.3 cm. Musculoskeletal: Lumbar fusion hardware again noted. No acute osseous abnormality. IMPRESSION: 1. New percutaneous biliary stent in place. 2. Increasing size and number of hepatic metastatic lesions. 3. New subcapsular fluid collections along the inferior margin of the liver with surrounding inflammation. Findings may be related to infection or metastatic disease. 4. Wall  thickening and inflammation of the ascending colon as it abuts the inferior margin of the liver and subcapsular fluid collections. Findings may be reactive to adjacent liver disease. 5. New small amount of ascites. 6. Stable peritoneal implants. 7. Stable bilateral adrenal nodules. 8. Stable pulmonary nodules in the lung bases. Aortic Atherosclerosis (ICD10-I70.0). Electronically Signed   By: Greig Pique M.D.   On: 05/25/2024 15:44   IR BILIARY DRAIN PLACEMENT WITH CHOLANGIOGRAM Result Date: 05/18/2024 INDICATION: 78 year old with gallbladder cancer, jaundice and elevated bilirubin. Attempted percutaneous biliary drain placement on 05/14/2024 was unsuccessful. Plan to re-attempt biliary drain placement. EXAM: 1. Percutaneous transhepatic cholangiogram using ultrasound and fluoroscopic guidance 2. Placement of internal/external biliary drain 3. Ultrasound-guided paracentesis MEDICATIONS: Cefoxitin 2 g; The antibiotic was administered within an appropriate time frame prior to the initiation of the procedure. ANESTHESIA/SEDATION: Moderate (conscious) sedation was employed during this procedure. A total of Versed  8 mg and Fentanyl  200 mcg and Dilaudid  1 mg was administered intravenously by the radiology nurse. Total intra-service moderate Sedation  Time: 70 minutes. The patient's level of consciousness and vital signs were monitored continuously by radiology nursing throughout the procedure under my direct supervision. FLUOROSCOPY: Radiation Exposure Index (as provided by the fluoroscopic device): 169 mGy Kerma CONTRAST:  30 mL Omnipaque  300 COMPLICATIONS: None immediate. PROCEDURE: Informed written consent was obtained from the patient after a thorough discussion of the procedural risks, benefits and alternatives. All questions were addressed. A timeout was performed prior to the initiation of the procedure. The anterior and right side of the abdomen were prepped and draped in sterile fashion. Maximal barrier  sterile technique was utilized including caps, mask, sterile gowns, sterile gloves, sterile drape, hand hygiene and skin antiseptic. Ultrasound demonstrated a small amount of perihepatic ascites that was new. Skin was anesthetized with 1% lidocaine . Using ultrasound guidance, a Yueh catheter was directed in the perihepatic ascites. Paracentesis was performed and 280 mL of amber fluid was removed. Yueh catheter was removed. Multiple attempts were made to cannulate a biliary duct in both the left and right hepatic lobes using ultrasound and fluoroscopic guidance. Procedure was technically difficult due to the minimal intrahepatic biliary dilatation. Eventually, a bile duct in the right hepatic lobe was cannulated and opacified with contrast. 0.018 wire was successfully advanced into the biliary system and an Accustick dilator set was placed. The Accustick dilator set was exchanged for a Kumpe catheter over a J wire. Subsequently, a Glidewire was used to traverse the central biliary obstruction. Catheter and wire advanced into the common bile duct and duodenum. Superstiff Amplatz wire was placed. The tract was dilated over the wire. Initially, a 10 French biliary drain was advanced over the wire but the drain would not traverse the central biliary obstruction. Therefore, an 8.5 French biliary drain was advanced over the wire. This drain was successfully advanced across the obstruction and into the duodenum. Contrast injection confirmed appropriate placement. Drain was flushed with saline and attached to a gravity bag. Drain was sutured to skin. Fluoroscopic and ultrasound images were taken and saved for documentation. FINDINGS: Ultrasound demonstrated a small amount of perihepatic ascites. Paracentesis was performed using ultrasound guidance. 280 mL of amber colored fluid was removed. A mildly dilated bile duct in the posterior right hepatic lobe was successfully cannulated and opacified with contrast. Additional right  hepatic ducts were opacified with contrast. Central biliary obstruction was identified. No significant filling of left intrahepatic bile ducts. Catheter and wire were advanced through the central biliary obstruction. Biliary drain was advanced into the duodenum. IMPRESSION: 1. Successful percutaneous transhepatic cholangiogram and placement of internal/external biliary drain. Biliary drain was placed via a right hepatic bile duct. 2. Ultrasound-guided paracentesis yielding 280 mL of fluid. Electronically Signed   By: Juliene Balder M.D.   On: 05/18/2024 20:31   IR Paracentesis Result Date: 05/18/2024 INDICATION: 78 year old with gallbladder cancer, jaundice and elevated bilirubin. Attempted percutaneous biliary drain placement on 05/14/2024 was unsuccessful. Plan to re-attempt biliary drain placement. EXAM: 1. Percutaneous transhepatic cholangiogram using ultrasound and fluoroscopic guidance 2. Placement of internal/external biliary drain 3. Ultrasound-guided paracentesis MEDICATIONS: Cefoxitin 2 g; The antibiotic was administered within an appropriate time frame prior to the initiation of the procedure. ANESTHESIA/SEDATION: Moderate (conscious) sedation was employed during this procedure. A total of Versed  8 mg and Fentanyl  200 mcg and Dilaudid  1 mg was administered intravenously by the radiology nurse. Total intra-service moderate Sedation Time: 70 minutes. The patient's level of consciousness and vital signs were monitored continuously by radiology nursing throughout the procedure under my direct supervision.  FLUOROSCOPY: Radiation Exposure Index (as provided by the fluoroscopic device): 169 mGy Kerma CONTRAST:  30 mL Omnipaque  300 COMPLICATIONS: None immediate. PROCEDURE: Informed written consent was obtained from the patient after a thorough discussion of the procedural risks, benefits and alternatives. All questions were addressed. A timeout was performed prior to the initiation of the procedure. The anterior  and right side of the abdomen were prepped and draped in sterile fashion. Maximal barrier sterile technique was utilized including caps, mask, sterile gowns, sterile gloves, sterile drape, hand hygiene and skin antiseptic. Ultrasound demonstrated a small amount of perihepatic ascites that was new. Skin was anesthetized with 1% lidocaine . Using ultrasound guidance, a Yueh catheter was directed in the perihepatic ascites. Paracentesis was performed and 280 mL of amber fluid was removed. Yueh catheter was removed. Multiple attempts were made to cannulate a biliary duct in both the left and right hepatic lobes using ultrasound and fluoroscopic guidance. Procedure was technically difficult due to the minimal intrahepatic biliary dilatation. Eventually, a bile duct in the right hepatic lobe was cannulated and opacified with contrast. 0.018 wire was successfully advanced into the biliary system and an Accustick dilator set was placed. The Accustick dilator set was exchanged for a Kumpe catheter over a J wire. Subsequently, a Glidewire was used to traverse the central biliary obstruction. Catheter and wire advanced into the common bile duct and duodenum. Superstiff Amplatz wire was placed. The tract was dilated over the wire. Initially, a 10 French biliary drain was advanced over the wire but the drain would not traverse the central biliary obstruction. Therefore, an 8.5 French biliary drain was advanced over the wire. This drain was successfully advanced across the obstruction and into the duodenum. Contrast injection confirmed appropriate placement. Drain was flushed with saline and attached to a gravity bag. Drain was sutured to skin. Fluoroscopic and ultrasound images were taken and saved for documentation. FINDINGS: Ultrasound demonstrated a small amount of perihepatic ascites. Paracentesis was performed using ultrasound guidance. 280 mL of amber colored fluid was removed. A mildly dilated bile duct in the posterior  right hepatic lobe was successfully cannulated and opacified with contrast. Additional right hepatic ducts were opacified with contrast. Central biliary obstruction was identified. No significant filling of left intrahepatic bile ducts. Catheter and wire were advanced through the central biliary obstruction. Biliary drain was advanced into the duodenum. IMPRESSION: 1. Successful percutaneous transhepatic cholangiogram and placement of internal/external biliary drain. Biliary drain was placed via a right hepatic bile duct. 2. Ultrasound-guided paracentesis yielding 280 mL of fluid. Electronically Signed   By: Juliene Balder M.D.   On: 05/18/2024 20:31   IR BILIARY DRAIN PLACEMENT WITH CHOLANGIOGRAM Result Date: 05/14/2024 INDICATION: 78 year old with metastatic gallbladder carcinoma and found to have progressive disease. Patient has mild biliary dilatation and elevated bilirubin. Plan for percutaneous transhepatic cholangiogram and attempt biliary drain placement. EXAM: 1. Percutaneous transhepatic cholangiogram using ultrasound and fluoroscopic guidance. 2. Attempted placement of percutaneous biliary drain. MEDICATIONS: Cefoxitin 2 g; The antibiotic was administered within an appropriate time frame prior to the initiation of the procedure. ANESTHESIA/SEDATION: Moderate (conscious) sedation was employed during this procedure. A total of Versed  4 mg and Fentanyl  150 mcg was administered intravenously by the radiology nurse. Total intra-service moderate Sedation Time: 70 minutes. The patient's level of consciousness and vital signs were monitored continuously by radiology nursing throughout the procedure under my direct supervision. FLUOROSCOPY: Radiation Exposure Index (as provided by the fluoroscopic device): 311 mGy Kerma COMPLICATIONS: None immediate. PROCEDURE: Informed written consent  was obtained from the patient after a thorough discussion of the procedural risks, benefits and alternatives. All questions were  addressed. Maximal Sterile Barrier Technique was utilized including caps, mask, sterile gowns, sterile gloves, sterile drape, hand hygiene and skin antiseptic. A timeout was performed prior to the initiation of the procedure. Right side of the abdomen was prepped and draped in sterile fashion. Ultrasound identified mild intrahepatic biliary dilatation in the right hepatic lobe. Right side of the abdomen was anesthetized with 1% lidocaine . Using ultrasound guidance, 21 gauge needle was directed into a peripheral bile duct. Contrast injection confirmed placement in the biliary system. Multiple attempts were made to cannulate a peripheral bile duct using 0.018 wires. These attempts to cannulate the biliary system with a wire were unsuccessful. Additional bile ducts were targeted using ultrasound and fluoroscopic guidance but these bile ducts could not be successfully cannulated with a wire. Eventually, the procedure was aborted. Bandage placed at the puncture sites. Fluoroscopic and ultrasound images were taken and saved for documentation. FINDINGS: Ultrasound demonstrated mild intrahepatic biliary dilatation. A peripheral right hepatic bile duct was targeted with ultrasound. A peripheral right hepatic bile duct was successfully opacified with contrast. Mild to moderate dilatation in the central portion of this bile duct. An additional bile duct towards the hepatic dome was also opacified. There is no filling of the left hepatic ducts. Contrast did drain into the common bile duct and duodenum. Lack of contrast at the biliary confluence probably related to obstructing lesions. IMPRESSION: 1. Percutaneous transhepatic cholangiogram demonstrates mild intrahepatic biliary dilatation but incomplete opacification of the biliary system. Findings are suggestive for partial obstructive disease in the central aspect of the biliary system but there is drainage into the extrahepatic bile duct and duodenum. 2. Attempts to place a  percutaneous biliary drain were unsuccessful due to the small size of the peripheral bile ducts. Electronically Signed   By: Juliene Balder M.D.   On: 05/14/2024 17:41   MR ABDOMEN MRCP W WO CONTAST Result Date: 05/13/2024 CLINICAL DATA:  Epigastric pain [id number] Epigastric pain [id number]; Epigastric pain. EXAM: MRI ABDOMEN WITHOUT AND WITH CONTRAST (INCLUDING MRCP) TECHNIQUE: Multiplanar multisequence MR imaging of the abdomen was performed both before and after the administration of intravenous contrast. Heavily T2-weighted images of the biliary and pancreatic ducts were obtained, and three-dimensional MRCP images were rendered by post processing. CONTRAST:  7mL GADAVIST  GADOBUTROL  1 MMOL/ML IV SOLN COMPARISON:  Ultrasound abdomen and CT scan abdomen and pelvis from 05/11/2024. FINDINGS: Lower chest: Unremarkable MR appearance to the lung bases. No pleural effusion. No pericardial effusion. Normal heart size. Hepatobiliary: The liver is moderately enlarged in size measuring up to 17.7 cm in length. Noncirrhotic configuration. There are multiple (around 10), T2 hyperintense,, peripherally rim enhancing target like lesions throughout liver, which have increased in size and number since the prior study and compatible with worsening metastatic disease. There is redemonstration of heterogeneous contracted gallbladder with ill-defined heterogeneous peripherally irregular wall enhancing lesion in the fundus of the gallbladder which appears to infiltrate into the left hepatic lobe, segment 4B, suggesting infiltrating tumor. The lesion measures up to 2.9 x 3.1 cm, grossly similar to the prior study., There is mild-to-moderate central intrahepatic bile duct dilation. There is abrupt narrowing/cutoff of the proximal extrahepatic bile duct. The mid/distal extrahepatic bile duct is nondilated. There is ill-defined peripheral irregular wall enhancing mass in the caudate lobe which is likely the cause of abrupt cutoff of the central  biliary tree. Findings may be due to primary  hilar cholangiocarcinoma versus metastases. The middle and right intrahepatic veins are patent. Left hepatic vein is not well seen, likely thrombosed. There is moderate narrowing in the midportion of main portal vein (series 19, image 31). Right portal vein and its major tributaries are patent. However, there is nonvisualization of left portal vein, suggesting thrombosis. These findings are new since the prior study from 12/14/2023. Pancreas: No mass, inflammatory changes or other parenchymal abnormality identified. No main pancreatic duct dilation. Spleen: Top-normal spleen measuring upto 5.5 x 12.9 orthogonally on coronal plane. No focal mass. Adrenals/Urinary Tract: Bilateral adrenal adenomas noted measuring 1.1 x 1.5 cm on the left side and 1.0 x 1.5 cm on the right side. No hydroureteronephrosis. No suspicious renal mass. Stomach/Bowel: Visualized portions within the abdomen are unremarkable. No disproportionate dilation of bowel loops. Vascular/Lymphatic: No pathologically enlarged lymph nodes identified. No abdominal aortic aneurysm demonstrated. No ascites. Redemonstration of multiple nodular implants throughout the abdomen, compatible with peritoneal carcinomatosis. Overall, there is mild interval increase in the size, suggesting worsening peritoneal carcinomatosis. Other: Susceptibility artifact from thoracolumbar spine spinal fixation hardware noted. Musculoskeletal: No suspicious bone lesions identified. IMPRESSION: 1. There is redemonstration of heterogeneous contracted gallbladder with ill-defined heterogeneous peripherally irregular wall enhancing lesion in the fundus of the gallbladder which appears to infiltrate into the left hepatic lobe, segment 4B, suggesting infiltrating tumor. 2. There are multiple (around 10), T2 hyperintense, peripherally rim enhancing target like lesions throughout liver, which have increased in size and number since the prior  study and compatible with worsening metastatic disease. 3. There is mild-to-moderate central intrahepatic bile duct dilation. There is abrupt narrowing/cutoff of the proximal extrahepatic bile duct. The mid/distal extrahepatic bile duct is nondilated. There is ill-defined peripheral irregular wall enhancing mass in the caudate lobe which is likely the cause of abrupt cutoff of the central biliary tree. Findings may be due to primary hilar cholangiocarcinoma versus metastases. 4. There is moderate narrowing in the midportion of main portal vein. Right portal vein and its major tributaries are patent. However, there is nonvisualization of left portal vein, suggesting thrombosis. There is also new nonvisualization of left hepatic vein. These findings are new since the prior study from 12/14/2023. 5. Bilateral adrenal adenomas. Electronically Signed   By: Ree Molt M.D.   On: 05/13/2024 13:26   MR 3D Recon At Scanner Result Date: 05/13/2024 CLINICAL DATA:  Epigastric pain 114842 Epigastric pain 114842; Epigastric pain. EXAM: MRI ABDOMEN WITHOUT AND WITH CONTRAST (INCLUDING MRCP) TECHNIQUE: Multiplanar multisequence MR imaging of the abdomen was performed both before and after the administration of intravenous contrast. Heavily T2-weighted images of the biliary and pancreatic ducts were obtained, and three-dimensional MRCP images were rendered by post processing. CONTRAST:  7mL GADAVIST  GADOBUTROL  1 MMOL/ML IV SOLN COMPARISON:  Ultrasound abdomen and CT scan abdomen and pelvis from 05/11/2024. FINDINGS: Lower chest: Unremarkable MR appearance to the lung bases. No pleural effusion. No pericardial effusion. Normal heart size. Hepatobiliary: The liver is moderately enlarged in size measuring up to 17.7 cm in length. Noncirrhotic configuration. There are multiple (around 10), T2 hyperintense,, peripherally rim enhancing target like lesions throughout liver, which have increased in size and number since the prior  study and compatible with worsening metastatic disease. There is redemonstration of heterogeneous contracted gallbladder with ill-defined heterogeneous peripherally irregular wall enhancing lesion in the fundus of the gallbladder which appears to infiltrate into the left hepatic lobe, segment 4B, suggesting infiltrating tumor. The lesion measures up to 2.9 x 3.1 cm, grossly similar to  the prior study., There is mild-to-moderate central intrahepatic bile duct dilation. There is abrupt narrowing/cutoff of the proximal extrahepatic bile duct. The mid/distal extrahepatic bile duct is nondilated. There is ill-defined peripheral irregular wall enhancing mass in the caudate lobe which is likely the cause of abrupt cutoff of the central biliary tree. Findings may be due to primary hilar cholangiocarcinoma versus metastases. The middle and right intrahepatic veins are patent. Left hepatic vein is not well seen, likely thrombosed. There is moderate narrowing in the midportion of main portal vein (series 19, image 31). Right portal vein and its major tributaries are patent. However, there is nonvisualization of left portal vein, suggesting thrombosis. These findings are new since the prior study from 12/14/2023. Pancreas: No mass, inflammatory changes or other parenchymal abnormality identified. No main pancreatic duct dilation. Spleen: Top-normal spleen measuring upto 5.5 x 12.9 orthogonally on coronal plane. No focal mass. Adrenals/Urinary Tract: Bilateral adrenal adenomas noted measuring 1.1 x 1.5 cm on the left side and 1.0 x 1.5 cm on the right side. No hydroureteronephrosis. No suspicious renal mass. Stomach/Bowel: Visualized portions within the abdomen are unremarkable. No disproportionate dilation of bowel loops. Vascular/Lymphatic: No pathologically enlarged lymph nodes identified. No abdominal aortic aneurysm demonstrated. No ascites. Redemonstration of multiple nodular implants throughout the abdomen, compatible  with peritoneal carcinomatosis. Overall, there is mild interval increase in the size, suggesting worsening peritoneal carcinomatosis. Other: Susceptibility artifact from thoracolumbar spine spinal fixation hardware noted. Musculoskeletal: No suspicious bone lesions identified. IMPRESSION: 1. There is redemonstration of heterogeneous contracted gallbladder with ill-defined heterogeneous peripherally irregular wall enhancing lesion in the fundus of the gallbladder which appears to infiltrate into the left hepatic lobe, segment 4B, suggesting infiltrating tumor. 2. There are multiple (around 10), T2 hyperintense, peripherally rim enhancing target like lesions throughout liver, which have increased in size and number since the prior study and compatible with worsening metastatic disease. 3. There is mild-to-moderate central intrahepatic bile duct dilation. There is abrupt narrowing/cutoff of the proximal extrahepatic bile duct. The mid/distal extrahepatic bile duct is nondilated. There is ill-defined peripheral irregular wall enhancing mass in the caudate lobe which is likely the cause of abrupt cutoff of the central biliary tree. Findings may be due to primary hilar cholangiocarcinoma versus metastases. 4. There is moderate narrowing in the midportion of main portal vein. Right portal vein and its major tributaries are patent. However, there is nonvisualization of left portal vein, suggesting thrombosis. There is also new nonvisualization of left hepatic vein. These findings are new since the prior study from 12/14/2023. 5. Bilateral adrenal adenomas. Electronically Signed   By: Ree Molt M.D.   On: 05/13/2024 13:26   US  Abdomen Limited RUQ (LIVER/GB) Result Date: 05/11/2024 EXAM: Right Upper Quadrant Abdominal Ultrasound 05/11/2024 06:56:14 PM CLINICAL HISTORY: Abnormal transaminases. TECHNIQUE: Real-time ultrasonography of the right upper quadrant of the abdomen was performed. COMPARISON: US  Abdomen Limited  12/14/2023. CT abdomen pelvis 08/24/2023. FINDINGS: LIVER: Nodular hepatic contour. Coarsened echogenicity of the hepatic parenchyma. No intrahepatic biliary ductal dilatation. No focal liver lesions identified. BILIARY SYSTEM: Gallbladder wall not visualized. The common bile duct measures 6.0 mm. OTHER: No right upper quadrant ascites. IMPRESSION: 1. Cirrhosis. No focal liver lesions identified. Liver protocol contrast-enhanced MR or CT are most sensitive for HCC screening in cirrhosis. 2. Gallbladder wall not visualized. Electronically signed by: Morgane Naveau MD 05/11/2024 07:06 PM EDT RP Workstation: [id number]   CT ABDOMEN PELVIS W CONTRAST Addendum Date: 05/11/2024 ** ADDENDUM #1 ** ADDENDUM: -- Clinicians worried about biliary obstruction.  There is mild central intrahepatic biliary dilatation, slightly increased when compared to the prior CT scan from 12/14/2023. The gallbladder is surgically absent. The cystic and solid appearing process in the gallbladder fossa is likely tumor and is grossly unchanged. The necrotic-appearing periportal nodal disease maybe slightly larger. It measures approximately 3.0x2.8 cm and previously measured 2.9x2.7 cm. There may be slight narrowing of the adjacent main portal vein and there also appears to be significant new stenosis of the left portal vein. MRI/MRCP may be helpful for further evaluation. -------------------------------------------------- Electronically signed by: Maude Stammer MD 05/11/2024 07:07 PM EDT RP Workstation: [id number]   Result Date: 05/11/2024 ** ORIGINAL REPORT ** EXAM: CT ABDOMEN AND PELVIS WITH CONTRAST 05/11/2024 03:50:05 PM TECHNIQUE: CT of the abdomen and pelvis was performed with the administration of 80 mL of iohexol  (OMNIPAQUE ) 300 MG/ML solution. Multiplanar reformatted images are provided for review. Automated exposure control, iterative reconstruction, and/or weight-based adjustment of the mA/kV was utilized to reduce the radiation  dose to as low as reasonably achievable. COMPARISON: PET CT 03/21/2024 and CT abdomen/pelvis 12/14/2023. CLINICAL HISTORY: Bowel obstruction suspected; LLQ abdominal pain. FINDINGS: LOWER CHEST: Persistent/stable pulmonary metastatic disease. No pleural or pericardial effusion. Stable vascular calcifications. LIVER: Hepatic metastatic disease. Suspect new hepatic lesions in both hepatic lobes. 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 Persistent mild intrahepatic biliary dilatation. Stable necrotic-appearing periportal adenopathy. GALLBLADDER AND BILE DUCTS: Surgically absent. No biliary ductal dilatation. SPLEEN: No splenic lesions. PANCREAS: The pancreas is unremarkable and stable. ADRENAL GLANDS: Bilateral adrenal gland nodules are stable. KIDNEYS, URETERS AND BLADDER: No renal lesions. The bladder is normal. No stones in the kidneys or ureters. No hydronephrosis. No perinephric or periureteral stranding. GI AND BOWEL: The stomach, duodenum, small bowel, and colon are unremarkable. No findings suspicious for bowel obstruction. Moderate stool burden. PERITONEUM AND RETROPERITONEUM: No free abdominal or free pelvic fluid. No mesenteric or retroperitoneal mass. Overall stable scattered omental implants near the liver and near the transverse colon. VASCULATURE: Stable advanced atherosclerotic calcifications involving the aorta and branch vessels but no aneurysm or dissection. The major venous structures are patent. LYMPH NODES: No lymphadenopathy. REPRODUCTIVE ORGANS: BONES AND SOFT TISSUES: The bony structures are intact. Extensive surgical changes involving the THORACIC and LUMBAR spines without obvious complicating features. Diffuse osteoporosis. No focal soft tissue abnormality. No inguinal mass or adenopathy. IMPRESSION: 1. No CT evidence of bowel obstruction. 2. Hepatic metastatic disease with suspected new hepatic lesions in both hepatic lobes. 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 3. Stable scattered omental implants near the liver and near the transverse colon. 4. Stable cystic and solid mass in the central pelvis measuring 4.8 cm. Electronically signed by: Maude Stammer MD 05/11/2024 04:38 PM EDT RP Workstation: [id number]    Microbiology: Results for orders placed or performed during the hospital encounter of 09/23/23  Urine Culture     Status: Abnormal   Collection Time: 09/23/23 12:24 PM   Specimen: Urine, Clean Catch  Result Value Ref Range Status   Specimen Description URINE, CLEAN CATCH  Final   Special Requests   Final    NONE Performed at [facility], [address] [place], [place] [address]    Culture >=100,000 COLONIES/mL ESCHERICHIA COLI (A)  Final   Report Status 09/25/2023 FINAL  Final   Organism ID, Bacteria ESCHERICHIA COLI (A)  Final      Susceptibility   Escherichia coli - MIC*  AMPICILLIN 4 SENSITIVE Sensitive     CEFAZOLIN  <=4 SENSITIVE Sensitive     CEFEPIME <=0.12 SENSITIVE Sensitive     CEFTRIAXONE  <=0.25 SENSITIVE Sensitive     CIPROFLOXACIN  <=0.25 SENSITIVE Sensitive     GENTAMICIN <=1 SENSITIVE Sensitive     IMIPENEM <=0.25 SENSITIVE Sensitive     NITROFURANTOIN <=16 SENSITIVE Sensitive     TRIMETH/SULFA <=20 SENSITIVE Sensitive     AMPICILLIN/SULBACTAM <=2 SENSITIVE Sensitive     PIP/TAZO <=4 SENSITIVE Sensitive ug/mL    * >=100,000 COLONIES/mL ESCHERICHIA COLI    Labs: CBC: Recent Labs  Lab 05/25/24 1205 05/26/24 0600  WBC 13.1* 11.0*  NEUTROABS 10.8*  --   HGB 9.4* 8.1*  HCT 28.4* 24.4*  MCV 100.7* 99.6  PLT 127* 113*   Basic Metabolic Panel: Recent Labs  Lab 05/25/24 1205 05/26/24 0600  NA 137 138  K 3.7 3.7  CL 105 108  CO2 20* 21*  GLUCOSE 109* 103*  BUN 29* 27*  CREATININE 1.06* 1.03*  CALCIUM  9.1 8.6*   Liver Function Tests: Recent Labs  Lab 05/25/24 1205  AST 52*  ALT 38  ALKPHOS 131*  BILITOT 5.3*  PROT  5.4*  ALBUMIN  2.7*   CBG: Recent Labs  Lab 05/28/24 1137  GLUCAP 118*    Discharge time spent: approximately 25 minutes spent on discharge counseling, evaluation of patient on day of discharge, and coordination of discharge planning with nursing, social work, pharmacy and case management  Signed: Lonni SHAUNNA Dalton, MD [facility] 05/28/2024

## 2024-05-28 NOTE — TOC Transition Note (Addendum)
 Transition of Care [facility]) - Discharge Note   Patient Details  Name: Janice Pace MRN: [id number] Date of Birth: January 21, 1946  [facility] CM/SW Contact:  Sonda Manuella Quill, RN Phone Number: 05/28/2024, 9:59 AM   Clinical Narrative:    D/C orders received; pt home w/ [facility] hospice; spoke w/ pt's dtr Johnnie and family in room; she confirmed d/c address: [address] [place], [place] [address]; POC dtr Grayce Molt [phone]); PTAR called at [phone]; spoke w/ operator [phone]; she was notified pt needs to arrive home before 2 pm as she has hospice appt; she verbalized understanding; no IP CM needs.   Final next level of care: Home w Hospice Care Barriers to Discharge: No Barriers Identified   Patient Goals and CMS Choice            Discharge Placement                       Discharge Plan and Services Additional resources added to the After Visit Summary for                                       Social Drivers of Health (SDOH) Interventions SDOH Screenings   Food Insecurity: No Food Insecurity (05/25/2024)  Housing: Low Risk  (05/25/2024)  Transportation Needs: No Transportation Needs (05/25/2024)  Utilities: Not At Risk (05/25/2024)  Alcohol Screen: Low Risk  (08/25/2023)  Depression (PHQ2-9): Low Risk  (05/09/2024)  Financial Resource Strain: Low Risk  (08/25/2023)  Physical Activity: Inactive (08/25/2023)  Social Connections: Socially Integrated (05/25/2024)  Stress: No Stress Concern Present (08/25/2023)  Tobacco Use: Medium Risk (05/26/2024)  Health Literacy: Adequate Health Literacy (06/03/2023)     Readmission Risk Interventions    05/13/2024    1:19 PM  Readmission Risk Prevention Plan  Transportation Screening Complete  PCP or Specialist Appt within 3-5 Days Complete  HRI or Home Care Consult Complete  Social Work Consult for Recovery Care Planning/Counseling Complete  Palliative Care Screening Not Applicable  Medication Review  Oceanographer) Complete

## 2024-05-28 NOTE — Plan of Care (Signed)
  Problem: Education: Goal: Knowledge of General Education information will improve Description: Including pain rating scale, medication(s)/side effects and non-pharmacologic comfort measures Outcome: Progressing   Problem: Health Behavior/Discharge Planning: Goal: Ability to manage health-related needs will improve Outcome: Progressing   Problem: Clinical Measurements: Goal: Respiratory complications will improve Outcome: Progressing Goal: Cardiovascular complication will be avoided Outcome: Progressing   Problem: Clinical Measurements: Goal: Ability to maintain clinical measurements within normal limits will improve Outcome: Not Progressing Goal: Will remain free from infection Outcome: Not Progressing Goal: Diagnostic test results will improve Outcome: Not Progressing

## 2024-05-30 ENCOUNTER — Telehealth: Payer: Self-pay

## 2024-06-01 ENCOUNTER — Inpatient Hospital Stay

## 2024-06-01 ENCOUNTER — Inpatient Hospital Stay: Admitting: Genetic Counselor

## 2024-06-02 NOTE — Telephone Encounter (Signed)
 Error

## 2024-06-08 ENCOUNTER — Ambulatory Visit

## 2024-06-13 DEATH — deceased

## 2024-06-15 ENCOUNTER — Inpatient Hospital Stay (HOSPITAL_COMMUNITY)

## 2024-06-16 ENCOUNTER — Inpatient Hospital Stay: Admitting: Oncology

## 2024-06-16 ENCOUNTER — Inpatient Hospital Stay

## 2024-08-14 ENCOUNTER — Other Ambulatory Visit: Payer: Self-pay

## 2024-08-14 DIAGNOSIS — E782 Mixed hyperlipidemia: Secondary | ICD-10-CM
